# Patient Record
Sex: Female | Born: 1935 | Race: White | Hispanic: No | Smoking: Never smoker
Health system: Southern US, Community
[De-identification: ages and names within clinical notes are randomized; demographics above are authoritative.]

## PROBLEM LIST (undated history)

## (undated) DIAGNOSIS — E7800 Pure hypercholesterolemia, unspecified: Secondary | ICD-10-CM

## (undated) DIAGNOSIS — M1990 Unspecified osteoarthritis, unspecified site: Secondary | ICD-10-CM

## (undated) DIAGNOSIS — G43909 Migraine, unspecified, not intractable, without status migrainosus: Secondary | ICD-10-CM

## (undated) DIAGNOSIS — F329 Major depressive disorder, single episode, unspecified: Secondary | ICD-10-CM

## (undated) DIAGNOSIS — R569 Unspecified convulsions: Secondary | ICD-10-CM

## (undated) DIAGNOSIS — F5104 Psychophysiologic insomnia: Secondary | ICD-10-CM

## (undated) DIAGNOSIS — K449 Diaphragmatic hernia without obstruction or gangrene: Secondary | ICD-10-CM

## (undated) DIAGNOSIS — Q752 Hypertelorism: Secondary | ICD-10-CM

## (undated) DIAGNOSIS — D329 Benign neoplasm of meninges, unspecified: Secondary | ICD-10-CM

## (undated) DIAGNOSIS — F419 Anxiety disorder, unspecified: Secondary | ICD-10-CM

## (undated) DIAGNOSIS — F32A Depression, unspecified: Secondary | ICD-10-CM

## (undated) DIAGNOSIS — K219 Gastro-esophageal reflux disease without esophagitis: Secondary | ICD-10-CM

## (undated) DIAGNOSIS — I10 Essential (primary) hypertension: Secondary | ICD-10-CM

## (undated) DIAGNOSIS — G8929 Other chronic pain: Secondary | ICD-10-CM

## (undated) DIAGNOSIS — M545 Low back pain: Secondary | ICD-10-CM

## (undated) HISTORY — DX: Depression, unspecified: F32.A

## (undated) HISTORY — DX: Other chronic pain: G89.29

## (undated) HISTORY — PX: STOMACH SURGERY: SHX791

## (undated) HISTORY — DX: Migraine, unspecified, not intractable, without status migrainosus: G43.909

## (undated) HISTORY — DX: Pure hypercholesterolemia, unspecified: E78.00

## (undated) HISTORY — DX: Anxiety disorder, unspecified: F41.9

## (undated) HISTORY — DX: Essential (primary) hypertension: I10

## (undated) HISTORY — DX: Psychophysiologic insomnia: F51.04

## (undated) HISTORY — DX: Hypertelorism: Q75.2

## (undated) HISTORY — DX: Diaphragmatic hernia without obstruction or gangrene: K44.9

## (undated) HISTORY — DX: Gastro-esophageal reflux disease without esophagitis: K21.9

## (undated) HISTORY — DX: Low back pain: M54.5

## (undated) HISTORY — DX: Benign neoplasm of meninges, unspecified: D32.9

## (undated) HISTORY — DX: Unspecified convulsions: R56.9

## (undated) HISTORY — DX: Major depressive disorder, single episode, unspecified: F32.9

## (undated) HISTORY — DX: Unspecified osteoarthritis, unspecified site: M19.90

---

## 2002-06-16 ENCOUNTER — Other Ambulatory Visit: Admission: RE | Admit: 2002-06-16 | Discharge: 2002-06-16 | Payer: Self-pay | Admitting: Gynecology

## 2003-02-09 ENCOUNTER — Other Ambulatory Visit: Admission: RE | Admit: 2003-02-09 | Discharge: 2003-02-09 | Payer: Self-pay | Admitting: Gynecology

## 2003-03-20 ENCOUNTER — Encounter: Admission: RE | Admit: 2003-03-20 | Discharge: 2003-03-20 | Payer: Self-pay | Admitting: Family Medicine

## 2003-03-20 ENCOUNTER — Encounter: Payer: Self-pay | Admitting: Family Medicine

## 2003-04-26 ENCOUNTER — Ambulatory Visit: Admission: RE | Admit: 2003-04-26 | Discharge: 2003-04-26 | Payer: Self-pay | Admitting: Gynecology

## 2003-05-04 ENCOUNTER — Ambulatory Visit (HOSPITAL_COMMUNITY): Admission: RE | Admit: 2003-05-04 | Discharge: 2003-05-04 | Payer: Self-pay | Admitting: Gastroenterology

## 2003-11-02 ENCOUNTER — Other Ambulatory Visit: Admission: RE | Admit: 2003-11-02 | Discharge: 2003-11-02 | Payer: Self-pay | Admitting: Gynecology

## 2003-11-02 ENCOUNTER — Emergency Department (HOSPITAL_COMMUNITY): Admission: EM | Admit: 2003-11-02 | Discharge: 2003-11-03 | Payer: Self-pay

## 2004-03-13 ENCOUNTER — Other Ambulatory Visit: Admission: RE | Admit: 2004-03-13 | Discharge: 2004-03-13 | Payer: Self-pay | Admitting: Gynecology

## 2004-09-04 ENCOUNTER — Other Ambulatory Visit: Admission: RE | Admit: 2004-09-04 | Discharge: 2004-09-04 | Payer: Self-pay | Admitting: Gynecology

## 2005-04-17 ENCOUNTER — Other Ambulatory Visit: Admission: RE | Admit: 2005-04-17 | Discharge: 2005-04-17 | Payer: Self-pay | Admitting: Gynecology

## 2005-12-04 ENCOUNTER — Other Ambulatory Visit: Admission: RE | Admit: 2005-12-04 | Discharge: 2005-12-04 | Payer: Self-pay | Admitting: Gynecology

## 2006-05-20 ENCOUNTER — Other Ambulatory Visit: Admission: RE | Admit: 2006-05-20 | Discharge: 2006-05-20 | Payer: Self-pay | Admitting: Gynecology

## 2007-01-26 ENCOUNTER — Other Ambulatory Visit: Admission: RE | Admit: 2007-01-26 | Discharge: 2007-01-26 | Payer: Self-pay | Admitting: Gynecology

## 2007-08-23 ENCOUNTER — Other Ambulatory Visit: Admission: RE | Admit: 2007-08-23 | Discharge: 2007-08-23 | Payer: Self-pay | Admitting: Gynecology

## 2008-02-23 ENCOUNTER — Ambulatory Visit: Payer: Self-pay | Admitting: Psychology

## 2008-03-02 ENCOUNTER — Ambulatory Visit: Payer: Self-pay | Admitting: Psychology

## 2008-03-16 ENCOUNTER — Ambulatory Visit: Payer: Self-pay | Admitting: Psychology

## 2008-04-03 ENCOUNTER — Encounter: Admission: RE | Admit: 2008-04-03 | Discharge: 2008-04-03 | Payer: Self-pay | Admitting: Specialist

## 2008-05-31 ENCOUNTER — Other Ambulatory Visit: Admission: RE | Admit: 2008-05-31 | Discharge: 2008-05-31 | Payer: Self-pay | Admitting: *Deleted

## 2011-03-07 NOTE — Consult Note (Signed)
NAME:  Tracey Armstrong, Tracey Armstrong                         ACCOUNT NO.:  [id number]   MEDICAL RECORD NO.:  [id number]                   PATIENT TYPE:  OUT   LOCATION:  GYN                                  FACILITY:  [facility]   PHYSICIAN:  De Blanch, M.D.         DATE OF BIRTH:  27-Sep-1935   DATE OF CONSULTATION:  04/26/2003  DATE OF DISCHARGE:                                   CONSULTATION   GYN ONCOLOGY PROGRAM   CHIEF COMPLAINT:  Abnormal Pap smear.   HISTORY OF PRESENT ILLNESS:  A 76 year old white married female seen in  consultation at the request of Dr. Teodora Medici regarding persistently  abnormal Pap smears.  The patient alleges that she had normal Pap smears on  an annual basis until April of 2003.  At that time, she had an ASCUS Pap  smear.  Cervicitis was treated.  In May of 2003, she had colposcopy with  biopsies that showed no abnormalities.  A repeat Pap smear in August of  2003, showed low-grade SIL.  Testing for high-risk HPV types revealed the  presence of HPV that was high risk.  She had a repeat colposcopy in November  of 2003, and because of inadequate ability to see the transformation zone, a  LEEP procedure was performed which revealed a small area of moderate  dysplasia with negative margins.  The patient had an uneventful  postoperative course.  In May of 2004, repeat Pap smear showed low-grade  SIL, and colposcopy with biopsies at that time showed slight dysplasia.   The patient has no other significant gynecologic history.   Her other complaint is that she has pain on the left side of the anus.  She  has previously had a hemorrhoidectomy in [facility].   PAST MEDICAL HISTORY:  Medical illnesses:  None.   PAST SURGICAL HISTORY:  Hemorrhoidectomy.   CURRENT MEDICATIONS:  Lipitor, Actonel, Bextra, Xanax, Neurontin 300 mg  Armstrong.i.d.   SOCIAL HISTORY:  She does not smoke.   OBSTETRICAL HISTORY:  Gravida 4.   DRUG ALLERGIES:  None.   REVIEW OF SYSTEMS:   Negative except as noted above.   PHYSICAL EXAM:  Weight 129 pounds, height 5 feet 2, blood pressure 118/70,  pulse 72, respiratory rate 20.  GENERAL:  The patient is a healthy white female in no acute distress.  HEENT:  Negative.  NECK:  Supple without thyromegaly.  ABDOMEN:  Soft and nontender.  No mass, organomegaly, ascites, or hernias  are noted.  PELVIC:  EGBUS is normal but slightly atrophic.  Inspection of the anus  appears essentially normal.  She has some small hemorrhoids.  Palpation of  the anus reveals no masses or nodularity.  The tenderness that she seems to  identify is at approximately 2 o'clock.  She does have a well-healed  episiotomy scar.  The vagina is atrophic.  The cervix is flush with the  vaginal vault and stenotic.  The uterus is small and anterior, and normal in  shape, size, and consistency.  There are no adnexal masses noted.  Rectovaginal exam confirms.   Colposcopic examination of the cervix, vulva, and vagina are performed.  No  lesions are noted and the squamocolumnar junction is stenotic.   IMPRESSION:  Low-grade squamous intraepithelial lesion (SIL).   I had a lengthy discussion with the patient regarding the natural history of  this disease.  She understands that this is a papilloma virus infection  which we do not have an antibiotic to treat.  I would recommend that she  have repeat Pap smears at six month intervals and that no further  intervention be undertaken unless she has a high-grade dysplasia Pap smear.   She will return to the care of Dr. Chevis Pretty for a repeat Pap smear in six  months.                                               De Blanch, M.D.    DC/MEDQ  D:  04/26/2003  T:  04/26/2003  Job:  [id number]   cc:   Leatha Gilding. Mezer, M.D.  [address]  [place]  [place] [address]  Fax: [phone]   Teena Irani. Arlyce Dice, M.D.  [address]  [place]  [place] [address]  Fax: [phone]   Telford Nab, R.N.  [address]  [place], [place] [address]

## 2011-03-07 NOTE — Op Note (Signed)
NAME:  Tracey Armstrong, Tracey Armstrong                         ACCOUNT NO.:  [id number]   MEDICAL RECORD NO.:  [id number]                   PATIENT TYPE:  AMB   LOCATION:  ENDO                                 FACILITY:  [facility]   PHYSICIAN:  Petra Kuba, M.D.                 DATE OF BIRTH:  03/14/1935   DATE OF PROCEDURE:  05/04/2003  DATE OF DISCHARGE:                                 OPERATIVE REPORT   PROCEDURE PERFORMED:  Colonoscopy.   ENDOSCOPIST:  Petra Kuba, M.D.   INDICATIONS FOR PROCEDURE:  Persistent rectal burning in a patient without  previous colonoscopy but multiple flexible sigmoidoscopies in the past.  Consent was signed after the risks, benefits, methods and options were  thoroughly discussed with the patient on multiple occasions.   MEDICINES USED:  Demerol 80 mg, Versed 9 mg.   DESCRIPTION OF PROCEDURE:  Rectal inspection was pertinent for external  hemorrhoids, small.  Digital exam was negative.  A video pediatric  adjustable colonoscope was inserted and anorectal pull-through and retroflex  viewing of the rectum revealed some small hemorrhoids and a small anal  papilla from previous hemorrhoidal scarring but no obvious fissure or other  abnormalities.  Went ahead and straightened the scope and with some  difficulty due to some looping and a tortuous colon, it was able to be  advanced to the cecum.  This did require rolling her on her back and various  abdominal pressures.  Other than some left-sided occasional diverticula, no  abnormalities were seen. The cecum was identified by the appendiceal orifice  and the ileocecal valve.  In fact, the scope was inserted a short ways into  the terminal ileum which was normal.  Photodocumentation was obtained.  The  scope was then slowly withdrawn.  The prep was adequate.  There was some  liquid stool that required washing and suctioning.  On slow withdrawal  through the colon, other than the rare occasional left-sided diverticula,  no  abnormalities were seen.  Again, once back in the rectum, anorectal pull-  through and retroflexion did not reveal any additional findings.  Scope was  straightened and advanced a short ways up the left side of the colon.  Air was suctioned, scope removed.  The patient tolerated the procedure well.  There was no immediate obvious complication.   ENDOSCOPIC DIAGNOSIS:  1. Internal and external hemorrhoids with a little anal papilla.  2. Rare left-sided diverticula.  3. Otherwise within normal limits to the terminal ileum.    PLAN:  Yearly rectals and guaiacs per either Dr. Arlyce Dice or Dr. Kemper DurieSharol Given.  GI follow-up as needed.  Repeat screening in five to 10 years.  Will ask Dr. Kendrick Ranch to evaluate her and see if he can be of any further  help.  As an aside, Elavil and Neurontin have not been helpful although she  will use the Neurontin at  a very low dose.                                               Petra Kuba, M.D.    MEM/MEDQ  D:  05/04/2003  T:  05/04/2003  Job:  [id number]   cc:   Teena Irani. Arlyce Dice, M.D.  [address]  [place]  [place] [address]  Fax: [phone]   Sheppard Plumber. Earlene Plater, M.D.  [address] [place]  [place] [address]  Fax: [phone]   De Blanch, M.D.

## 2012-04-19 ENCOUNTER — Other Ambulatory Visit: Payer: Self-pay | Admitting: Gastroenterology

## 2012-04-19 DIAGNOSIS — R109 Unspecified abdominal pain: Secondary | ICD-10-CM | POA: Diagnosis not present

## 2012-04-19 DIAGNOSIS — R635 Abnormal weight gain: Secondary | ICD-10-CM | POA: Diagnosis not present

## 2012-04-26 ENCOUNTER — Ambulatory Visit
Admission: RE | Admit: 2012-04-26 | Discharge: 2012-04-26 | Disposition: A | Discharge status: Home / Self Care | Payer: Medicare Other | Source: Ambulatory Visit | Attending: Gastroenterology | Admitting: Gastroenterology

## 2012-04-26 DIAGNOSIS — R109 Unspecified abdominal pain: Secondary | ICD-10-CM

## 2012-04-26 DIAGNOSIS — R635 Abnormal weight gain: Secondary | ICD-10-CM

## 2012-05-26 DIAGNOSIS — E785 Hyperlipidemia, unspecified: Secondary | ICD-10-CM | POA: Diagnosis not present

## 2012-05-26 DIAGNOSIS — K219 Gastro-esophageal reflux disease without esophagitis: Secondary | ICD-10-CM | POA: Diagnosis not present

## 2012-05-26 DIAGNOSIS — F329 Major depressive disorder, single episode, unspecified: Secondary | ICD-10-CM | POA: Diagnosis not present

## 2012-05-26 DIAGNOSIS — F411 Generalized anxiety disorder: Secondary | ICD-10-CM | POA: Diagnosis not present

## 2012-05-26 DIAGNOSIS — F3289 Other specified depressive episodes: Secondary | ICD-10-CM | POA: Diagnosis not present

## 2012-12-09 DIAGNOSIS — M810 Age-related osteoporosis without current pathological fracture: Secondary | ICD-10-CM | POA: Diagnosis not present

## 2012-12-09 DIAGNOSIS — K219 Gastro-esophageal reflux disease without esophagitis: Secondary | ICD-10-CM | POA: Diagnosis not present

## 2012-12-10 DIAGNOSIS — R5381 Other malaise: Secondary | ICD-10-CM | POA: Diagnosis not present

## 2013-01-18 DIAGNOSIS — R944 Abnormal results of kidney function studies: Secondary | ICD-10-CM | POA: Diagnosis not present

## 2013-01-24 DIAGNOSIS — R7301 Impaired fasting glucose: Secondary | ICD-10-CM | POA: Diagnosis not present

## 2013-01-24 DIAGNOSIS — E782 Mixed hyperlipidemia: Secondary | ICD-10-CM | POA: Diagnosis not present

## 2013-01-24 DIAGNOSIS — K219 Gastro-esophageal reflux disease without esophagitis: Secondary | ICD-10-CM | POA: Diagnosis not present

## 2013-01-24 DIAGNOSIS — R5381 Other malaise: Secondary | ICD-10-CM | POA: Diagnosis not present

## 2013-01-24 DIAGNOSIS — R944 Abnormal results of kidney function studies: Secondary | ICD-10-CM | POA: Diagnosis not present

## 2013-01-24 DIAGNOSIS — M810 Age-related osteoporosis without current pathological fracture: Secondary | ICD-10-CM | POA: Diagnosis not present

## 2013-01-24 DIAGNOSIS — G43909 Migraine, unspecified, not intractable, without status migrainosus: Secondary | ICD-10-CM | POA: Diagnosis not present

## 2013-01-24 DIAGNOSIS — E559 Vitamin D deficiency, unspecified: Secondary | ICD-10-CM | POA: Diagnosis not present

## 2013-03-28 ENCOUNTER — Other Ambulatory Visit: Payer: Self-pay | Admitting: Family Medicine

## 2013-03-28 DIAGNOSIS — M278 Other specified diseases of jaws: Secondary | ICD-10-CM | POA: Diagnosis not present

## 2013-03-28 DIAGNOSIS — F329 Major depressive disorder, single episode, unspecified: Secondary | ICD-10-CM | POA: Diagnosis not present

## 2013-03-28 DIAGNOSIS — F411 Generalized anxiety disorder: Secondary | ICD-10-CM | POA: Diagnosis not present

## 2013-03-28 DIAGNOSIS — D239 Other benign neoplasm of skin, unspecified: Secondary | ICD-10-CM | POA: Diagnosis not present

## 2013-03-28 DIAGNOSIS — R51 Headache: Secondary | ICD-10-CM | POA: Diagnosis not present

## 2013-03-28 DIAGNOSIS — K219 Gastro-esophageal reflux disease without esophagitis: Secondary | ICD-10-CM | POA: Diagnosis not present

## 2013-04-04 ENCOUNTER — Other Ambulatory Visit: Payer: Medicare Other

## 2013-04-18 ENCOUNTER — Other Ambulatory Visit: Payer: Medicare Other

## 2013-04-19 ENCOUNTER — Ambulatory Visit
Admission: RE | Admit: 2013-04-19 | Discharge: 2013-04-19 | Disposition: A | Discharge status: Home / Self Care | Payer: Medicare Other | Source: Ambulatory Visit | Attending: Family Medicine | Admitting: Family Medicine

## 2013-04-19 DIAGNOSIS — R51 Headache: Secondary | ICD-10-CM | POA: Diagnosis not present

## 2013-04-21 DIAGNOSIS — L280 Lichen simplex chronicus: Secondary | ICD-10-CM | POA: Diagnosis not present

## 2013-04-21 DIAGNOSIS — D485 Neoplasm of uncertain behavior of skin: Secondary | ICD-10-CM | POA: Diagnosis not present

## 2013-04-21 DIAGNOSIS — L821 Other seborrheic keratosis: Secondary | ICD-10-CM | POA: Diagnosis not present

## 2013-04-25 DIAGNOSIS — H43819 Vitreous degeneration, unspecified eye: Secondary | ICD-10-CM | POA: Diagnosis not present

## 2013-04-25 DIAGNOSIS — H2510 Age-related nuclear cataract, unspecified eye: Secondary | ICD-10-CM | POA: Diagnosis not present

## 2013-04-25 DIAGNOSIS — H52209 Unspecified astigmatism, unspecified eye: Secondary | ICD-10-CM | POA: Diagnosis not present

## 2013-09-23 DIAGNOSIS — R1110 Vomiting, unspecified: Secondary | ICD-10-CM | POA: Diagnosis not present

## 2013-09-23 DIAGNOSIS — F411 Generalized anxiety disorder: Secondary | ICD-10-CM | POA: Diagnosis not present

## 2013-09-23 DIAGNOSIS — F329 Major depressive disorder, single episode, unspecified: Secondary | ICD-10-CM | POA: Diagnosis not present

## 2013-09-23 DIAGNOSIS — G43909 Migraine, unspecified, not intractable, without status migrainosus: Secondary | ICD-10-CM | POA: Diagnosis not present

## 2013-09-28 DIAGNOSIS — G43909 Migraine, unspecified, not intractable, without status migrainosus: Secondary | ICD-10-CM | POA: Diagnosis not present

## 2013-09-28 DIAGNOSIS — R1110 Vomiting, unspecified: Secondary | ICD-10-CM | POA: Diagnosis not present

## 2013-09-28 DIAGNOSIS — F3289 Other specified depressive episodes: Secondary | ICD-10-CM | POA: Diagnosis not present

## 2013-09-28 DIAGNOSIS — F329 Major depressive disorder, single episode, unspecified: Secondary | ICD-10-CM | POA: Diagnosis not present

## 2013-09-29 DIAGNOSIS — G43709 Chronic migraine without aura, not intractable, without status migrainosus: Secondary | ICD-10-CM | POA: Diagnosis not present

## 2013-09-29 DIAGNOSIS — I10 Essential (primary) hypertension: Secondary | ICD-10-CM | POA: Diagnosis not present

## 2013-09-29 DIAGNOSIS — G43009 Migraine without aura, not intractable, without status migrainosus: Secondary | ICD-10-CM | POA: Diagnosis not present

## 2013-10-05 DIAGNOSIS — G43709 Chronic migraine without aura, not intractable, without status migrainosus: Secondary | ICD-10-CM | POA: Diagnosis not present

## 2013-10-17 DIAGNOSIS — G43709 Chronic migraine without aura, not intractable, without status migrainosus: Secondary | ICD-10-CM | POA: Diagnosis not present

## 2013-10-31 DIAGNOSIS — R7309 Other abnormal glucose: Secondary | ICD-10-CM | POA: Diagnosis not present

## 2013-10-31 DIAGNOSIS — K219 Gastro-esophageal reflux disease without esophagitis: Secondary | ICD-10-CM | POA: Diagnosis not present

## 2013-10-31 DIAGNOSIS — G43909 Migraine, unspecified, not intractable, without status migrainosus: Secondary | ICD-10-CM | POA: Diagnosis not present

## 2013-10-31 DIAGNOSIS — F329 Major depressive disorder, single episode, unspecified: Secondary | ICD-10-CM | POA: Diagnosis not present

## 2013-10-31 DIAGNOSIS — F3289 Other specified depressive episodes: Secondary | ICD-10-CM | POA: Diagnosis not present

## 2013-11-09 DIAGNOSIS — F329 Major depressive disorder, single episode, unspecified: Secondary | ICD-10-CM | POA: Diagnosis not present

## 2013-11-09 DIAGNOSIS — K219 Gastro-esophageal reflux disease without esophagitis: Secondary | ICD-10-CM | POA: Diagnosis not present

## 2013-11-09 DIAGNOSIS — F3289 Other specified depressive episodes: Secondary | ICD-10-CM | POA: Diagnosis not present

## 2013-11-09 DIAGNOSIS — F411 Generalized anxiety disorder: Secondary | ICD-10-CM | POA: Diagnosis not present

## 2013-11-09 DIAGNOSIS — G43909 Migraine, unspecified, not intractable, without status migrainosus: Secondary | ICD-10-CM | POA: Diagnosis not present

## 2013-11-24 DIAGNOSIS — G43709 Chronic migraine without aura, not intractable, without status migrainosus: Secondary | ICD-10-CM | POA: Diagnosis not present

## 2013-11-24 DIAGNOSIS — I10 Essential (primary) hypertension: Secondary | ICD-10-CM | POA: Diagnosis not present

## 2013-11-24 DIAGNOSIS — G43009 Migraine without aura, not intractable, without status migrainosus: Secondary | ICD-10-CM | POA: Diagnosis not present

## 2013-12-19 DIAGNOSIS — Z0000 Encounter for general adult medical examination without abnormal findings: Secondary | ICD-10-CM | POA: Diagnosis not present

## 2013-12-19 DIAGNOSIS — K219 Gastro-esophageal reflux disease without esophagitis: Secondary | ICD-10-CM | POA: Diagnosis not present

## 2014-01-09 DIAGNOSIS — K219 Gastro-esophageal reflux disease without esophagitis: Secondary | ICD-10-CM | POA: Diagnosis not present

## 2014-01-10 DIAGNOSIS — F3289 Other specified depressive episodes: Secondary | ICD-10-CM | POA: Diagnosis not present

## 2014-01-10 DIAGNOSIS — K5730 Diverticulosis of large intestine without perforation or abscess without bleeding: Secondary | ICD-10-CM | POA: Diagnosis not present

## 2014-01-10 DIAGNOSIS — I10 Essential (primary) hypertension: Secondary | ICD-10-CM | POA: Diagnosis not present

## 2014-01-10 DIAGNOSIS — F329 Major depressive disorder, single episode, unspecified: Secondary | ICD-10-CM | POA: Diagnosis not present

## 2014-01-10 DIAGNOSIS — G43909 Migraine, unspecified, not intractable, without status migrainosus: Secondary | ICD-10-CM | POA: Diagnosis not present

## 2014-01-10 DIAGNOSIS — Z01810 Encounter for preprocedural cardiovascular examination: Secondary | ICD-10-CM | POA: Diagnosis not present

## 2014-01-10 DIAGNOSIS — R109 Unspecified abdominal pain: Secondary | ICD-10-CM | POA: Diagnosis not present

## 2014-01-10 DIAGNOSIS — K449 Diaphragmatic hernia without obstruction or gangrene: Secondary | ICD-10-CM | POA: Diagnosis not present

## 2014-01-11 DIAGNOSIS — G43909 Migraine, unspecified, not intractable, without status migrainosus: Secondary | ICD-10-CM | POA: Diagnosis not present

## 2014-01-11 DIAGNOSIS — Z1211 Encounter for screening for malignant neoplasm of colon: Secondary | ICD-10-CM | POA: Diagnosis not present

## 2014-01-11 DIAGNOSIS — R109 Unspecified abdominal pain: Secondary | ICD-10-CM | POA: Diagnosis not present

## 2014-01-11 DIAGNOSIS — K219 Gastro-esophageal reflux disease without esophagitis: Secondary | ICD-10-CM | POA: Diagnosis not present

## 2014-01-11 DIAGNOSIS — K5730 Diverticulosis of large intestine without perforation or abscess without bleeding: Secondary | ICD-10-CM | POA: Diagnosis not present

## 2014-01-11 DIAGNOSIS — I10 Essential (primary) hypertension: Secondary | ICD-10-CM | POA: Diagnosis not present

## 2014-01-11 DIAGNOSIS — D131 Benign neoplasm of stomach: Secondary | ICD-10-CM | POA: Diagnosis not present

## 2014-01-11 DIAGNOSIS — F329 Major depressive disorder, single episode, unspecified: Secondary | ICD-10-CM | POA: Diagnosis not present

## 2014-01-11 DIAGNOSIS — K449 Diaphragmatic hernia without obstruction or gangrene: Secondary | ICD-10-CM | POA: Diagnosis not present

## 2014-01-11 DIAGNOSIS — F3289 Other specified depressive episodes: Secondary | ICD-10-CM | POA: Diagnosis not present

## 2014-01-23 DIAGNOSIS — K449 Diaphragmatic hernia without obstruction or gangrene: Secondary | ICD-10-CM | POA: Diagnosis not present

## 2014-01-28 DIAGNOSIS — I10 Essential (primary) hypertension: Secondary | ICD-10-CM | POA: Diagnosis not present

## 2014-01-28 DIAGNOSIS — G589 Mononeuropathy, unspecified: Secondary | ICD-10-CM | POA: Diagnosis not present

## 2014-01-28 DIAGNOSIS — K219 Gastro-esophageal reflux disease without esophagitis: Secondary | ICD-10-CM | POA: Diagnosis not present

## 2014-01-28 DIAGNOSIS — F411 Generalized anxiety disorder: Secondary | ICD-10-CM | POA: Diagnosis not present

## 2014-01-28 DIAGNOSIS — E669 Obesity, unspecified: Secondary | ICD-10-CM | POA: Diagnosis not present

## 2014-01-28 DIAGNOSIS — Z6828 Body mass index (BMI) 28.0-28.9, adult: Secondary | ICD-10-CM | POA: Diagnosis not present

## 2014-01-28 DIAGNOSIS — K449 Diaphragmatic hernia without obstruction or gangrene: Secondary | ICD-10-CM | POA: Diagnosis not present

## 2014-01-28 DIAGNOSIS — G43909 Migraine, unspecified, not intractable, without status migrainosus: Secondary | ICD-10-CM | POA: Diagnosis not present

## 2014-02-13 DIAGNOSIS — F329 Major depressive disorder, single episode, unspecified: Secondary | ICD-10-CM | POA: Diagnosis not present

## 2014-02-13 DIAGNOSIS — F3289 Other specified depressive episodes: Secondary | ICD-10-CM | POA: Diagnosis not present

## 2014-02-13 DIAGNOSIS — K219 Gastro-esophageal reflux disease without esophagitis: Secondary | ICD-10-CM | POA: Diagnosis not present

## 2014-02-13 DIAGNOSIS — F411 Generalized anxiety disorder: Secondary | ICD-10-CM | POA: Diagnosis not present

## 2014-02-13 DIAGNOSIS — R7309 Other abnormal glucose: Secondary | ICD-10-CM | POA: Diagnosis not present

## 2014-03-24 DIAGNOSIS — I10 Essential (primary) hypertension: Secondary | ICD-10-CM | POA: Diagnosis not present

## 2014-03-24 DIAGNOSIS — F411 Generalized anxiety disorder: Secondary | ICD-10-CM | POA: Diagnosis not present

## 2014-03-24 DIAGNOSIS — M549 Dorsalgia, unspecified: Secondary | ICD-10-CM | POA: Diagnosis not present

## 2014-03-24 DIAGNOSIS — F329 Major depressive disorder, single episode, unspecified: Secondary | ICD-10-CM | POA: Diagnosis not present

## 2014-03-24 DIAGNOSIS — F3289 Other specified depressive episodes: Secondary | ICD-10-CM | POA: Diagnosis not present

## 2014-03-24 DIAGNOSIS — G43909 Migraine, unspecified, not intractable, without status migrainosus: Secondary | ICD-10-CM | POA: Diagnosis not present

## 2014-03-31 DIAGNOSIS — G8929 Other chronic pain: Secondary | ICD-10-CM | POA: Diagnosis not present

## 2014-04-03 DIAGNOSIS — K589 Irritable bowel syndrome without diarrhea: Secondary | ICD-10-CM | POA: Diagnosis not present

## 2014-04-03 DIAGNOSIS — K2100 Gastro-esophageal reflux disease with esophagitis, without bleeding: Secondary | ICD-10-CM | POA: Diagnosis not present

## 2014-04-03 DIAGNOSIS — R109 Unspecified abdominal pain: Secondary | ICD-10-CM | POA: Diagnosis not present

## 2014-04-19 ENCOUNTER — Encounter: Payer: Self-pay | Admitting: Neurology

## 2014-04-19 ENCOUNTER — Ambulatory Visit (INDEPENDENT_AMBULATORY_CARE_PROVIDER_SITE_OTHER): Payer: Medicare Other | Admitting: Neurology

## 2014-04-19 VITALS — BP 118/72 | HR 88 | Temp 97.5°F | Resp 16 | Ht 63.0 in | Wt 147.6 lb

## 2014-04-19 DIAGNOSIS — G8929 Other chronic pain: Secondary | ICD-10-CM

## 2014-04-19 DIAGNOSIS — F329 Major depressive disorder, single episode, unspecified: Secondary | ICD-10-CM | POA: Diagnosis not present

## 2014-04-19 DIAGNOSIS — R519 Headache, unspecified: Secondary | ICD-10-CM

## 2014-04-19 DIAGNOSIS — F32A Depression, unspecified: Secondary | ICD-10-CM

## 2014-04-19 DIAGNOSIS — M549 Dorsalgia, unspecified: Secondary | ICD-10-CM

## 2014-04-19 DIAGNOSIS — G43909 Migraine, unspecified, not intractable, without status migrainosus: Secondary | ICD-10-CM | POA: Insufficient documentation

## 2014-04-19 DIAGNOSIS — F3289 Other specified depressive episodes: Secondary | ICD-10-CM

## 2014-04-19 DIAGNOSIS — R51 Headache: Secondary | ICD-10-CM

## 2014-04-19 DIAGNOSIS — F411 Generalized anxiety disorder: Secondary | ICD-10-CM

## 2014-04-19 DIAGNOSIS — I10 Essential (primary) hypertension: Secondary | ICD-10-CM

## 2014-04-19 MED ORDER — NORTRIPTYLINE HCL 10 MG PO CAPS: 10.0000 mg | ORAL_CAPSULE | Freq: Every day | ORAL | Status: DC

## 2014-04-19 NOTE — Patient Instructions (Signed)
1. Start nortriptyline 10mg : Take 1 cap at bedtime for 2 weeks, then increase to 2 caps at bedtime 2. Continue gabapentin 200mg  in AM, 300mg  in PM 3. Keep a calendar of the headaches 4. Continue to monitor balance and walking, fall precautions

## 2014-04-19 NOTE — Progress Notes (Signed)
NEUROLOGY CONSULTATION NOTE  Tracey Armstrong MRN: [id number] DOB: 04/30/1935  Referring provider: Dr. Maurice Small Primary care provider: Dr. Maurice Small  Reason for consult:  migraines  Dear Dr Justin Mend:  Thank you for your kind referral of Tracey Armstrong for consultation of the above symptoms. Although her history is well known to you, please allow me to reiterate it for the purpose of our medical record. Records and images were personally reviewed where available.  HISTORY OF PRESENT ILLNESS: This is a 79 year old right-handed woman with a history of depression, anxiety, chronic low back pain, and chronic migraines.  She would live in [place] several months of the year, and saw a neurologist there, as well as Dr. Domingo Cocking at [facility] in [place].  She reports having migraines since age 72, with throbbing and pressure pain in the right retro-orbital region radiating to the back of her head, occurring around twice a week where she has to stay in bed.  She would take Maxalt MLT which helps within an hour.  There is associated nausea, photo and phonophobia, no visual aura or focal numbness/tingling/weakness.  Over the past 2 years, she has had a different type of pressure-like headache localized over the right parietal region occurring 3-4 times a week.  Only Norco would help with this, she takes 1/2 tablet BID, but sometimes would take an extra half tablet.  It appears she had problems with medication overuse in the past, she brings a sheet of paper where she was instructed by Dr. Domingo Cocking to stop Darvocet.  She has tried trigger point injections with minimal effect. She recalls having Botox in 2009 which caused significant neck weakness.  Her neurologist Dr. Patrice Paradise in [facility] did a retrial of Botox last 09/2013 which she feels helped.  She has been taking Verapamil and Gabapentin for headache prophylaxis, and feels the gabapentin helps her more. She take gabapentin 200mg  in  AM, 300mg  in PM.  She does not recall trying any beta-blockers in the past.  She had side effects on Topamax, amitriptyline, and Pristiq.  She has not tried Depakote but is afraid of the side effects. She has not tried Effexor due to unrecalled side effects on Pristiq.  She endorses depressive symptoms and is interested in trying a prophylactic medication with an anti-depressant.  She has tried Cymbalta, amitriptyline, Trazodone, and Imipramine in the past which would worsen her headaches within the 3rd week.    There is a strong family history of migraines in her grandmother, maternal aunt, father, and 5 nieces.  Migraine triggers include weather changes, heat, and air blowing in her face.  She reports that sleep is poor.  She is "just so stressed out, but there is nothing that should stress me out."  She had a whole week of migraines last year, followed by vertigo.  She reports Benadryl helps.  She had another episode of vertigo the other day after she had a migraine for a week, however recently she has been having dizziness when she would sit up from a supine position. She denies any dizziness when turning in bed.  She recalls having dizzy spells with falls as a child, and has had an increase in frequency of falls where she would just trip.  Last fall was a month ago, with an abrasion in her right knee.  She had abdominal surgery recently and was prescribed Norco for this as well.  She denies any diplopia, dysarthria, dysphagia, bowel/bladder  dysfunction. She has some pain in the right upper neck and chronic back pain radiating down the left leg.    I personally reviewed MRI brain without contrast which was unremarkable, there was note of focal bony thickening in the right parietal bone, unchanged from the prior study. There was mild enhancement of this area previously and is most likely is a small meningioma, stable.  PAST MEDICAL HISTORY: Past Medical History  Diagnosis Date  . Migraine   . GERD  (gastroesophageal reflux disease)   . Hypertelorism     PAST SURGICAL HISTORY: Past Surgical History  Procedure Laterality Date  . Stomach surgery      MEDICATIONS: No current outpatient prescriptions on file prior to visit.   No current facility-administered medications on file prior to visit.    ALLERGIES: No Known Allergies  FAMILY HISTORY: Family History  Problem Relation Age of Onset  . Migraines Father   . Heart Problems Mother     SOCIAL HISTORY: History   Social History  . Marital Status: Legally Separated    Spouse Name: N/A    Number of Children: N/A  . Years of Education: N/A   Occupational History  . Not on file.   Social History Main Topics  . Smoking status: Never Smoker   . Smokeless tobacco: Not on file  . Alcohol Use: No  . Drug Use: No  . Sexual Activity: Not on file   Other Topics Concern  . Not on file   Social History Narrative  . No narrative on file    REVIEW OF SYSTEMS: Constitutional: No fevers, chills, or sweats, no generalized fatigue, change in appetite Eyes: No visual changes, double vision, eye pain Ear, nose and throat: No hearing loss, ear pain, nasal congestion, sore throat Cardiovascular: No chest pain, palpitations Respiratory:  No shortness of breath at rest or with exertion, wheezes GastrointestinaI: No diarrhea, abdominal pain, fecal incontinence Genitourinary:  No dysuria, urinary retention or frequency Musculoskeletal:  + neck pain, back pain Integumentary: No rash, pruritus, skin lesions Neurological: as above Psychiatric: + depression, insomnia, anxiety Endocrine: No palpitations, fatigue, diaphoresis, mood swings, change in appetite, change in weight, increased thirst Hematologic/Lymphatic:  No anemia, purpura, petechiae. Allergic/Immunologic: no itchy/runny eyes, nasal congestion, recent allergic reactions, rashes  PHYSICAL EXAM: Filed Vitals:   04/19/14 1300  BP: 118/72  Pulse: 88  Temp: 97.5 F  (36.4 C)  Resp: 16   General: No acute distress Head:  Normocephalic/atraumatic Eyes: Fundoscopic exam shows bilateral sharp discs, no vessel changes, exudates, or hemorrhages Neck: supple, no paraspinal tenderness, full range of motion Back: No paraspinal tenderness Heart: regular rate and rhythm Lungs: Clear to auscultation bilaterally. Vascular: No carotid bruits. Skin/Extremities: No rash, no edema Neurological Exam: Mental status: alert and oriented to person, place, and time, no dysarthria or aphasia, Fund of knowledge is appropriate.  Recent and remote memory are intact.  Attention and concentration are normal.    Able to name objects and repeat phrases. Cranial nerves: CN I: not tested CN II: pupils equal, round and reactive to light, visual fields intact, fundi unremarkable. CN III, IV, VI:  full range of motion, no nystagmus, no ptosis CN V: facial sensation intact CN VII: upper and lower face symmetric CN VIII: hearing intact to finger rub CN IX, X: gag intact, uvula midline CN XI: sternocleidomastoid and trapezius muscles intact CN XII: tongue midline Bulk & Tone: normal, no cogwheeling, no fasciculations. Motor: 5/5 throughout with no pronator drift. Sensation: decreased vibration  sense up to right ankle, intact on left. Otherwise intact to all modalities on both UE, intact to light touch, cold, pin, and joint position sense in both LE.  No extinction to double simultaneous stimulation.  Romberg test negative Deep Tendon Reflexes: +2 throughout except for absent bilateral ankle jerks, no ankle clonus Plantar responses: downgoing bilaterally Cerebellar: no incoordination on finger to nose, heel to shin. No dysdiadochokinesia Gait: slow and cautious, no ataxia, mild difficulty with tandem walk but able. Tremor: none  IMPRESSION: This is a 79 year old right-handed woman with a history of depression, anxiety, chronic back pain, and chronic migraines, likely with component  of medication overuse headaches due to daily intake of Norco.  She is taking Verapamil and gabapentin for headache prophylaxis. She feels gabapentin is helping with her headaches, and we discussed the option of increasing dose, however she reports that the reason she came for this visit is to see if there is anything we could offer her that is new with migraine prophylaxis.  I discussed with her that she has not tried beta-blockers, nortriptyline, Effexor, however there are no new medications out at this time.  Botox is another consideration.  She would like to try a preventative medication that helps with depression, and will start nortriptyline 10mg  qhs with uptitration as tolerated. Side effects were discussed. We discussed medication overuse headaches and to minimize Norco use.  She will keep a headache diary. We discussed the increased falls, likely secondary to peripheral neuropathy. We discussed doing physical therapy for gait and balance, however she would like to hold off on this for now. The dizziness she reports occurs when she changes position from supine to standing, suggestive of orthostatic component, she will monitor BP and discuss this with her PCP.  She will follow-up in 2 months.  Thank you for allowing me to participate in the care of this patient. Please do not hesitate to call for any questions or concerns.   Ellouise Newer, M.D.  CC: Dr. Maurice Small

## 2014-04-20 DIAGNOSIS — F411 Generalized anxiety disorder: Secondary | ICD-10-CM | POA: Insufficient documentation

## 2014-04-20 DIAGNOSIS — R519 Headache, unspecified: Secondary | ICD-10-CM | POA: Insufficient documentation

## 2014-04-20 DIAGNOSIS — M549 Dorsalgia, unspecified: Secondary | ICD-10-CM

## 2014-04-20 DIAGNOSIS — R51 Headache: Secondary | ICD-10-CM

## 2014-04-20 DIAGNOSIS — F32A Depression, unspecified: Secondary | ICD-10-CM | POA: Insufficient documentation

## 2014-04-20 DIAGNOSIS — I10 Essential (primary) hypertension: Secondary | ICD-10-CM | POA: Insufficient documentation

## 2014-04-20 DIAGNOSIS — F329 Major depressive disorder, single episode, unspecified: Secondary | ICD-10-CM | POA: Insufficient documentation

## 2014-04-20 DIAGNOSIS — G8929 Other chronic pain: Secondary | ICD-10-CM | POA: Insufficient documentation

## 2014-05-04 ENCOUNTER — Telehealth: Payer: Self-pay | Admitting: Neurology

## 2014-05-04 NOTE — Telephone Encounter (Signed)
Would you like for me to start a pre auth with insurance for this?

## 2014-05-04 NOTE — Telephone Encounter (Signed)
Pt wants to consider Botox, ASAP. Please call [phone], OK to LM if needed / Sherri S.

## 2014-05-05 NOTE — Telephone Encounter (Signed)
Yes please, thanks

## 2014-05-05 NOTE — Telephone Encounter (Signed)
Spoke with patient advised patient we would be getting this approved by her insurance and would called her once we are ready to schedule

## 2014-05-05 NOTE — Telephone Encounter (Signed)
Pt would like to talk to someone [phone] about botox

## 2014-05-09 DIAGNOSIS — M5030 Other cervical disc degeneration, unspecified cervical region: Secondary | ICD-10-CM | POA: Diagnosis not present

## 2014-05-09 DIAGNOSIS — M9981 Other biomechanical lesions of cervical region: Secondary | ICD-10-CM | POA: Diagnosis not present

## 2014-05-10 DIAGNOSIS — M9981 Other biomechanical lesions of cervical region: Secondary | ICD-10-CM | POA: Diagnosis not present

## 2014-05-10 DIAGNOSIS — M5030 Other cervical disc degeneration, unspecified cervical region: Secondary | ICD-10-CM | POA: Diagnosis not present

## 2014-05-15 DIAGNOSIS — G8929 Other chronic pain: Secondary | ICD-10-CM | POA: Diagnosis not present

## 2014-05-15 DIAGNOSIS — G43909 Migraine, unspecified, not intractable, without status migrainosus: Secondary | ICD-10-CM | POA: Diagnosis not present

## 2014-05-15 DIAGNOSIS — F3289 Other specified depressive episodes: Secondary | ICD-10-CM | POA: Diagnosis not present

## 2014-05-15 DIAGNOSIS — F329 Major depressive disorder, single episode, unspecified: Secondary | ICD-10-CM | POA: Diagnosis not present

## 2014-06-23 DIAGNOSIS — L089 Local infection of the skin and subcutaneous tissue, unspecified: Secondary | ICD-10-CM | POA: Diagnosis not present

## 2014-07-17 DIAGNOSIS — H43819 Vitreous degeneration, unspecified eye: Secondary | ICD-10-CM | POA: Diagnosis not present

## 2014-07-17 DIAGNOSIS — H52209 Unspecified astigmatism, unspecified eye: Secondary | ICD-10-CM | POA: Diagnosis not present

## 2014-07-17 DIAGNOSIS — H25019 Cortical age-related cataract, unspecified eye: Secondary | ICD-10-CM | POA: Diagnosis not present

## 2014-07-17 DIAGNOSIS — H2510 Age-related nuclear cataract, unspecified eye: Secondary | ICD-10-CM | POA: Diagnosis not present

## 2014-07-19 DIAGNOSIS — I10 Essential (primary) hypertension: Secondary | ICD-10-CM | POA: Diagnosis not present

## 2014-07-19 DIAGNOSIS — F329 Major depressive disorder, single episode, unspecified: Secondary | ICD-10-CM | POA: Diagnosis not present

## 2014-07-19 DIAGNOSIS — G43909 Migraine, unspecified, not intractable, without status migrainosus: Secondary | ICD-10-CM | POA: Diagnosis not present

## 2014-07-19 DIAGNOSIS — G8929 Other chronic pain: Secondary | ICD-10-CM | POA: Diagnosis not present

## 2014-07-19 DIAGNOSIS — F3289 Other specified depressive episodes: Secondary | ICD-10-CM | POA: Diagnosis not present

## 2014-08-04 DIAGNOSIS — G43909 Migraine, unspecified, not intractable, without status migrainosus: Secondary | ICD-10-CM | POA: Diagnosis not present

## 2014-08-04 DIAGNOSIS — F331 Major depressive disorder, recurrent, moderate: Secondary | ICD-10-CM | POA: Diagnosis not present

## 2014-08-04 DIAGNOSIS — I10 Essential (primary) hypertension: Secondary | ICD-10-CM | POA: Diagnosis not present

## 2014-08-24 DIAGNOSIS — K219 Gastro-esophageal reflux disease without esophagitis: Secondary | ICD-10-CM | POA: Diagnosis not present

## 2014-08-24 DIAGNOSIS — I10 Essential (primary) hypertension: Secondary | ICD-10-CM | POA: Diagnosis not present

## 2014-08-24 DIAGNOSIS — F331 Major depressive disorder, recurrent, moderate: Secondary | ICD-10-CM | POA: Diagnosis not present

## 2014-08-30 DIAGNOSIS — K219 Gastro-esophageal reflux disease without esophagitis: Secondary | ICD-10-CM | POA: Diagnosis not present

## 2014-08-30 DIAGNOSIS — Z6826 Body mass index (BMI) 26.0-26.9, adult: Secondary | ICD-10-CM | POA: Diagnosis not present

## 2014-08-30 DIAGNOSIS — I10 Essential (primary) hypertension: Secondary | ICD-10-CM | POA: Diagnosis not present

## 2014-08-30 DIAGNOSIS — R7309 Other abnormal glucose: Secondary | ICD-10-CM | POA: Diagnosis not present

## 2014-08-30 DIAGNOSIS — G43909 Migraine, unspecified, not intractable, without status migrainosus: Secondary | ICD-10-CM | POA: Diagnosis not present

## 2014-08-30 DIAGNOSIS — E559 Vitamin D deficiency, unspecified: Secondary | ICD-10-CM | POA: Diagnosis not present

## 2014-08-30 DIAGNOSIS — E785 Hyperlipidemia, unspecified: Secondary | ICD-10-CM | POA: Diagnosis not present

## 2014-10-25 DIAGNOSIS — E559 Vitamin D deficiency, unspecified: Secondary | ICD-10-CM | POA: Diagnosis not present

## 2014-10-25 DIAGNOSIS — G8929 Other chronic pain: Secondary | ICD-10-CM | POA: Diagnosis not present

## 2014-10-25 DIAGNOSIS — Z5181 Encounter for therapeutic drug level monitoring: Secondary | ICD-10-CM | POA: Diagnosis not present

## 2014-10-25 DIAGNOSIS — G43909 Migraine, unspecified, not intractable, without status migrainosus: Secondary | ICD-10-CM | POA: Diagnosis not present

## 2014-10-25 DIAGNOSIS — F331 Major depressive disorder, recurrent, moderate: Secondary | ICD-10-CM | POA: Diagnosis not present

## 2014-10-30 ENCOUNTER — Ambulatory Visit: Payer: Medicare Other | Admitting: Neurology

## 2014-10-30 ENCOUNTER — Telehealth: Payer: Self-pay | Admitting: Neurology

## 2014-10-30 NOTE — Telephone Encounter (Signed)
Pt called at 8:44AM to let us know that she is not going to be able to make her f/u appt for today. Pt has a migraine. She will call later to r/s. I explained to her our reschedule/cancelation policy.

## 2014-10-30 NOTE — Telephone Encounter (Signed)
Noted  

## 2014-10-31 ENCOUNTER — Telehealth: Payer: Self-pay | Admitting: Family Medicine

## 2014-10-31 NOTE — Telephone Encounter (Signed)
FYI: I spoke with Tracey Armstrong she wants to hold off on Botox for now, she states when had Botox in the past it seemed to make her headahes worse once they come back. She thinks her current med regimen is working well, she states she is down to 1 headache a week. She states that she will try Botox again if her headaches become bad enough.  She states that her insurance hasn't changed & they have approved Botox for her twice before. I told her that if she got to a point where she wanted to try Botox again to call me & I will re-submit request.

## 2014-10-31 NOTE — Telephone Encounter (Signed)
appt still marked as a no show due to less than 24hrs prior notification. A no show letter will not be sen / Sherri S.

## 2014-10-31 NOTE — Telephone Encounter (Signed)
-----   Message from Cameron Sprang, MD sent at 10/30/2014 10:59 AM EST ----- Regarding: RE: can you pls check She called to cancel today because of a migraine. Can you let her know (maybe tomorrow after she rests) that I can certainly have Dr. Tomi Likens evaluate her, however if insurance won't cover it, he would not be able to do it as well  Thanks KA ----- Message -----    From: Thurmon Fair, CMA    Sent: 10/30/2014  10:53 AM      To: Cameron Sprang, MD Subject: RE: can you pls check                          I asked Danae Chen about this one, she said that her insurance wouldn't cover Botox. ----- Message -----    From: Cameron Sprang, MD    Sent: 10/30/2014   8:15 AM      To: Thurmon Fair, CMA Subject: can you pls check                              Good morning, she is coming today to discuss Botox that she wants done with Dr. Tomi Likens. Looking back at notes, in July it appears we tried to set that up, can you pls check records and see where it led or stopped?  Thanks

## 2014-11-06 DIAGNOSIS — S93601A Unspecified sprain of right foot, initial encounter: Secondary | ICD-10-CM | POA: Diagnosis not present

## 2014-12-11 DIAGNOSIS — G4700 Insomnia, unspecified: Secondary | ICD-10-CM | POA: Diagnosis not present

## 2014-12-11 DIAGNOSIS — F331 Major depressive disorder, recurrent, moderate: Secondary | ICD-10-CM | POA: Diagnosis not present

## 2014-12-11 DIAGNOSIS — F419 Anxiety disorder, unspecified: Secondary | ICD-10-CM | POA: Diagnosis not present

## 2014-12-11 DIAGNOSIS — G43909 Migraine, unspecified, not intractable, without status migrainosus: Secondary | ICD-10-CM | POA: Diagnosis not present

## 2014-12-11 DIAGNOSIS — G8929 Other chronic pain: Secondary | ICD-10-CM | POA: Diagnosis not present

## 2014-12-27 DIAGNOSIS — E559 Vitamin D deficiency, unspecified: Secondary | ICD-10-CM | POA: Diagnosis not present

## 2014-12-27 DIAGNOSIS — I10 Essential (primary) hypertension: Secondary | ICD-10-CM | POA: Diagnosis not present

## 2014-12-27 DIAGNOSIS — E538 Deficiency of other specified B group vitamins: Secondary | ICD-10-CM | POA: Diagnosis not present

## 2014-12-27 DIAGNOSIS — E785 Hyperlipidemia, unspecified: Secondary | ICD-10-CM | POA: Diagnosis not present

## 2014-12-27 DIAGNOSIS — R5383 Other fatigue: Secondary | ICD-10-CM | POA: Diagnosis not present

## 2015-01-05 ENCOUNTER — Encounter: Payer: Self-pay | Admitting: Neurology

## 2015-01-05 ENCOUNTER — Ambulatory Visit (INDEPENDENT_AMBULATORY_CARE_PROVIDER_SITE_OTHER): Payer: Medicare Other | Admitting: Neurology

## 2015-01-05 VITALS — BP 110/80 | HR 83 | Resp 16 | Ht 63.0 in | Wt 145.0 lb

## 2015-01-05 DIAGNOSIS — IMO0002 Reserved for concepts with insufficient information to code with codable children: Secondary | ICD-10-CM

## 2015-01-05 DIAGNOSIS — R51 Headache: Secondary | ICD-10-CM

## 2015-01-05 DIAGNOSIS — G43709 Chronic migraine without aura, not intractable, without status migrainosus: Secondary | ICD-10-CM

## 2015-01-05 DIAGNOSIS — F329 Major depressive disorder, single episode, unspecified: Secondary | ICD-10-CM | POA: Diagnosis not present

## 2015-01-05 DIAGNOSIS — F32A Depression, unspecified: Secondary | ICD-10-CM

## 2015-01-05 DIAGNOSIS — R519 Headache, unspecified: Secondary | ICD-10-CM

## 2015-01-05 NOTE — Patient Instructions (Signed)
1. Refer to Psychiatry for management of depression 2. Call our office once you decide on Botox 3. Follow-up in 6 months

## 2015-01-05 NOTE — Progress Notes (Signed)
NEUROLOGY FOLLOW UP OFFICE NOTE  Tracey Armstrong [id number]  HISTORY OF PRESENT ILLNESS: I had the pleasure of seeing Tracey Armstrong in follow-up in the neurology clinic on 01/05/2015.  The patient was last seen 8 months ago for chronic migraines. She was asking about new options for migraine prophylaxis. She had been taking Verapamil and Gabapentin. We had discussed she has not tried beta-blocker, nortriptyline, Effexor. She had done Botox in the past and we also discussed restarting this. She started nortriptyline but felt she had more headaches and stopped the medication. She reports the only medication that helps her aside from Maxalt is Norco 5-325mg , "it acts like an antidepressant, helps with my shakiness and depression." She take 1-1/2 tablets daily. This helps her sleep, in addition to the Xanax.   Headaches are throbbing and pressure pain in the right hemisphere, worse when lying down, better when standing up. She has had shakiness in both hands, left>right, with numbness in the fingers of her left hand. She had a bout of bad headaches for 4 days, she was thinking of going to the ER, then got better after a few hours. She feels she is depressed. She fell in January and fractured her right foot. Her vitamin D is low but she has had side effects on replacement treatment. She is also concerned about "water dripping from my nose."  HPI: This is a 80 yo RH woman with a history of depression, anxiety, chronic low back pain, and chronic migraines. She would live in [place] several months of the year, and saw a neurologist there, as well as Dr. Domingo Cocking at [facility] in [place]. She reports having migraines since age 49, with throbbing and pressure pain in the right retro-orbital region radiating to the back of her head, occurring around twice a week where she has to stay in bed. She would take Maxalt MLT which helps within an hour. There is associated nausea, photo and  phonophobia, no visual aura or focal numbness/tingling/weakness. Over the past 2 years, she has had a different type of pressure-like headache localized over the right parietal region occurring 3-4 times a week. Only Norco would help with this, she takes 1/2 tablet BID, but sometimes would take an extra half tablet. It appears she had problems with medication overuse in the past, she brings a sheet of paper where she was instructed by Dr. Domingo Cocking to stop Darvocet. She has tried trigger point injections with minimal effect. She recalls having Botox in 2009 which caused significant neck weakness. Her neurologist Dr. Patrice Paradise in [facility] did a retrial of Botox last 09/2013 which she feels helped. She has been taking Verapamil and Gabapentin for headache prophylaxis, and feels the gabapentin helps her more. She take gabapentin 200mg  in AM, 300mg  in PM. She does not recall trying any beta-blockers in the past. She had side effects on Topamax, amitriptyline, and Pristiq. She has not tried Depakote but is afraid of the side effects. She has not tried Effexor due to unrecalled side effects on Pristiq. She endorses depressive symptoms and is interested in trying a prophylactic medication with an anti-depressant. She has tried Cymbalta, amitriptyline, Trazodone, and Imipramine in the past which would worsen her headaches within the 3rd week.   There is a strong family history of migraines in her grandmother, maternal aunt, father, and 5 nieces. Migraine triggers include weather changes, heat, and air blowing in her face. She reports that sleep is poor.   I personally  reviewed MRI brain without contrast which was unremarkable, there was note of focal bony thickening in the right parietal bone, unchanged from the prior study. There was mild enhancement of this area previously and is most likely is a small meningioma, stable.  PAST MEDICAL HISTORY: Past Medical History  Diagnosis Date  . Migraine   . GERD  (gastroesophageal reflux disease)   . Hypertelorism     MEDICATIONS: Current Outpatient Prescriptions on File Prior to Visit  Medication Sig Dispense Refill  . alprazolam (XANAX) 2 MG tablet Take 2 mg by mouth. 2-3 TABLETS PO QD    . gabapentin (NEURONTIN) 100 MG capsule Take 100 mg by mouth. 2 PO Q AM    . gabapentin (NEURONTIN) 300 MG capsule Take 300 mg by mouth at bedtime.    Marland Kitchen HYDROcodone-acetaminophen (NORCO/VICODIN) 5-325 MG per tablet Take 1 tablet by mouth every 6 (six) hours as needed for moderate pain.    . Magnesium 250 MG TABS Take 250 mg by mouth daily. 2 PO DAILY    . rizatriptan (MAXALT-MLT) 10 MG disintegrating tablet Take 10 mg by mouth as needed for migraine. May repeat in 2 hours if needed     No current facility-administered medications on file prior to visit.    ALLERGIES: No Known Allergies  FAMILY HISTORY: Family History  Problem Relation Age of Onset  . Migraines Father   . Heart Problems Mother     SOCIAL HISTORY: History   Social History  . Marital Status: Legally Separated    Spouse Name: N/A  . Number of Children: N/A  . Years of Education: N/A   Occupational History  . Not on file.   Social History Main Topics  . Smoking status: Never Smoker   . Smokeless tobacco: Not on file  . Alcohol Use: No  . Drug Use: No  . Sexual Activity: Not on file   Other Topics Concern  . Not on file   Social History Narrative    REVIEW OF SYSTEMS: Constitutional: No fevers, chills, or sweats, no generalized fatigue, change in appetite Eyes: No visual changes, double vision, eye pain Ear, nose and throat: No hearing loss, ear pain, nasal congestion, sore throat Cardiovascular: No chest pain, palpitations Respiratory:  No shortness of breath at rest or with exertion, wheezes GastrointestinaI: No nausea, vomiting, diarrhea, abdominal pain, fecal incontinence Genitourinary:  No dysuria, urinary retention or frequency Musculoskeletal:  + neck pain, back  pain Integumentary: No rash, pruritus, skin lesions Neurological: as above Psychiatric: + depression, insomnia, anxiety Endocrine: No palpitations, fatigue, diaphoresis, mood swings, change in appetite, change in weight, increased thirst Hematologic/Lymphatic:  No anemia, purpura, petechiae. Allergic/Immunologic: no itchy/runny eyes, nasal congestion, recent allergic reactions, rashes  PHYSICAL EXAM: Filed Vitals:   01/05/15 1123  BP: 110/80  Pulse: 83  Resp: 16   General: No acute distress Head:  Normocephalic/atraumatic Neck: supple, no paraspinal tenderness, full range of motion Heart:  Regular rate and rhythm Lungs:  Clear to auscultation bilaterally Back: No paraspinal tenderness Skin/Extremities: No rash, no edema Neurological Exam: alert and oriented to person, place, and time. No aphasia or dysarthria. Fund of knowledge is appropriate.  Recent and remote memory are intact.  Attention and concentration are normal.    Able to name objects and repeat phrases. Cranial nerves: Pupils equal, round, reactive to light.  Fundoscopic exam unremarkable, no papilledema. Extraocular movements intact with no nystagmus. Visual fields full. Facial sensation intact. No facial asymmetry. Tongue, uvula, palate midline.  Motor: Bulk  and tone normal, muscle strength 5/5 throughout with no pronator drift.  Sensation to light touch intact.  No extinction to double simultaneous stimulation.  Deep tendon reflexes 2+ throughout, toes downgoing.  Finger to nose testing intact.  Gait narrow-based and steady, mild difficulty with tandem walk but able, no ataxia.  Romberg negative.  IMPRESSION: This is a 80 yo RH woman with a history of depression, anxiety, chronic back pain, and chronic migraines, likely with component of medication overuse headaches due to daily intake of Norco. She reports this helps as an antidepressant for her. She is taking Verapamil and gabapentin for headache prophylaxis. Headaches have  quieted down, she had called our office one time to consider Botox again. She did not tolerate nortriptyline. We again discussed the option of Botox for the chronic migraines, she will call our office when symptoms worsen. I discussed with her the indication for Norco, it is not an antidepressant, she would benefit from seeing a psychiatrist to discuss newer options for antidepressants. She will follow-up in 6 months and knows to call our office for any changes.  Thank you for allowing me to participate in her care.  Please do not hesitate to call for any questions or concerns.  The duration of this appointment visit was 15 minutes of face-to-face time with the patient.  Greater than 50% of this time was spent in counseling, explanation of diagnosis, planning of further management, and coordination of care.   Ellouise Newer, M.D.   CC: Dr. Justin Mend

## 2015-01-10 ENCOUNTER — Encounter: Payer: Self-pay | Admitting: Neurology

## 2015-01-10 DIAGNOSIS — G43709 Chronic migraine without aura, not intractable, without status migrainosus: Secondary | ICD-10-CM | POA: Insufficient documentation

## 2015-01-10 DIAGNOSIS — IMO0002 Reserved for concepts with insufficient information to code with codable children: Secondary | ICD-10-CM | POA: Insufficient documentation

## 2015-01-29 ENCOUNTER — Ambulatory Visit: Payer: Medicare Other | Admitting: Neurology

## 2015-02-14 DIAGNOSIS — R51 Headache: Secondary | ICD-10-CM | POA: Diagnosis not present

## 2015-02-14 DIAGNOSIS — I10 Essential (primary) hypertension: Secondary | ICD-10-CM | POA: Diagnosis not present

## 2015-02-14 DIAGNOSIS — F331 Major depressive disorder, recurrent, moderate: Secondary | ICD-10-CM | POA: Diagnosis not present

## 2015-02-14 DIAGNOSIS — F419 Anxiety disorder, unspecified: Secondary | ICD-10-CM | POA: Diagnosis not present

## 2015-02-14 DIAGNOSIS — G8929 Other chronic pain: Secondary | ICD-10-CM | POA: Diagnosis not present

## 2015-03-28 ENCOUNTER — Ambulatory Visit: Payer: Medicare Other | Admitting: Neurology

## 2015-03-30 ENCOUNTER — Ambulatory Visit (INDEPENDENT_AMBULATORY_CARE_PROVIDER_SITE_OTHER): Payer: Medicare Other | Admitting: Neurology

## 2015-03-30 ENCOUNTER — Encounter: Payer: Self-pay | Admitting: Neurology

## 2015-03-30 VITALS — BP 127/76 | HR 72 | Ht 63.0 in | Wt 142.4 lb

## 2015-03-30 DIAGNOSIS — F5104 Psychophysiologic insomnia: Secondary | ICD-10-CM

## 2015-03-30 DIAGNOSIS — G43709 Chronic migraine without aura, not intractable, without status migrainosus: Secondary | ICD-10-CM | POA: Diagnosis not present

## 2015-03-30 DIAGNOSIS — G4700 Insomnia, unspecified: Secondary | ICD-10-CM

## 2015-03-30 DIAGNOSIS — IMO0002 Reserved for concepts with insufficient information to code with codable children: Secondary | ICD-10-CM

## 2015-03-30 HISTORY — DX: Psychophysiologic insomnia: F51.04

## 2015-03-30 MED ORDER — RIBOFLAVIN 400 MG PO CAPS: 400.0000 mg | ORAL_CAPSULE | Freq: Every day | ORAL | Status: DC

## 2015-03-30 MED ORDER — VERAPAMIL HCL ER 180 MG PO TBCR: 180.0000 mg | EXTENDED_RELEASE_TABLET | Freq: Every day | ORAL | Status: DC

## 2015-03-30 NOTE — Patient Instructions (Signed)

## 2015-03-30 NOTE — Progress Notes (Signed)
Reason for visit: Migraine headache  Referring physician: Dr. Kingsley Callander is a 80 y.o. female  History of present illness:  Ms. Himmelberger is a 80 year old right-handed white female with a history of chronic migraine headaches since age 55. As she has gotten older, the headaches have become more frequent and more severe. In the past, she has been seen through the [facility], and she received Botox injections, but this resulted in cervical paraspinal muscle weakness that was transient. The patient has more recently seen Dr. Delice Lesch from [facility] neurology. The patient has been on a multitude of medications in the past that include nortriptyline, Cymbalta, trazodone, and imipramine without good tolerance or effectiveness. The patient currently is on verapamil at the 120 mg dose, with some benefit. The patient also takes gabapentin, but she indicates that this is making her balance poor, she has had multiple falls. The patient reports that her headaches generally will start around the right eye, and spread into the right parietal and occipital area. The patient denies any significant nausea or vomiting, but she does have photophobia and phonophobia. Maxalt will usually help these headaches. The patient has had a different type of headache recently with a crawling sensation that begins her around the right ear, and goes up into the right parietal area. Maxalt does not help this as much. The patient is having about 10 headache days a month. Her headaches when they do occur may last 2-3 days. The patient indicates that she is becoming afraid of going out of the house in fear of getting a headache. The activators for the headache include weather changes, and certain odors. She denies any focal numbness or weakness on the face, arms, or legs. She is sent to this office for an evaluation. The patient reports that over the last 4 or 5 months, she has had clear drainage from the nasal  passageways during some of her headaches. Otherwise, she does not have nasal drainage.  Past Medical History  Diagnosis Date  . Migraine   . GERD (gastroesophageal reflux disease)   . Hypertelorism   . Hypertension   . Anxiety   . Depression   . Hiatal hernia   . Chronic insomnia 03/30/2015    Past Surgical History  Procedure Laterality Date  . Stomach surgery      Family History  Problem Relation Age of Onset  . Migraines Father   . Stroke Father   . Heart Problems Mother     Social history:  reports that she has never smoked. She has never used smokeless tobacco. She reports that she does not drink alcohol or use illicit drugs.  Medications:  Prior to Admission medications   Medication Sig Start Date End Date Taking? Authorizing Provider  alprazolam Duanne Moron) 2 MG tablet Take 2 mg by mouth. 2-3 TABLETS PO QD   Yes Historical Provider, MD  gabapentin (NEURONTIN) 100 MG capsule Take 100 mg by mouth. 2 PO Q AM   Yes Historical Provider, MD  gabapentin (NEURONTIN) 300 MG capsule Take 300 mg by mouth at bedtime.   Yes Historical Provider, MD  HYDROcodone-acetaminophen (NORCO/VICODIN) 5-325 MG per tablet Take 1 tablet by mouth every 6 (six) hours as needed for moderate pain.   Yes Historical Provider, MD  Magnesium 250 MG TABS Take 250 mg by mouth daily. 2 PO DAILY   Yes Historical Provider, MD  omeprazole (PRILOSEC) 20 MG capsule Take 20 mg by mouth daily.   Yes Historical  Provider, MD  rizatriptan (MAXALT-MLT) 10 MG disintegrating tablet Take 10 mg by mouth as needed for migraine. May repeat in 2 hours if needed   Yes Historical Provider, MD  tretinoin (RETIN-A) 0.1 % cream Apply topically at bedtime.   Yes Historical Provider, MD  verapamil (VERELAN PM) 120 MG 24 hr capsule 120 mg. Take 1 tablet daily 10/23/14  Yes Historical Provider, MD      Allergies  Allergen Reactions  . Trazodone And Nefazodone   . Vitamin D Analogs     ROS:  Out of a complete 14 system review of  symptoms, the patient complains only of the following symptoms, and all other reviewed systems are negative.  Fatigue Ringing in the ears, difficulty swallowing Blurred vision Shortness of breath Feeling cold, increased thirst Achy muscles Runny nose Memory loss, confusion, headache, numbness, weakness, slurred speech, difficulty swallowing, dizziness, tremor Gait disturbance Depression, anxiety, chronic insomnia Disinterest in activities  Blood pressure 127/76, pulse 72, height 5\' 3"  (1.6 m), weight 142 lb 6.4 oz (64.592 kg).  Physical Exam  General: The patient is alert and cooperative at the time of the examination.  Eyes: Pupils are equal, round, and reactive to light. Discs are flat bilaterally.  Neck: The neck is supple, no carotid bruits are noted.  Respiratory: The respiratory examination is clear.  Cardiovascular: The cardiovascular examination reveals a regular rate and rhythm, no obvious murmurs or rubs are noted.  Skin: Extremities are without significant edema.  Neurologic Exam  Mental status: The patient is alert and oriented x 3 at the time of the examination. The patient has apparent normal recent and remote memory, with an apparently normal attention span and concentration ability.  Cranial nerves: Facial symmetry is present. There is good sensation of the face to pinprick and soft touch bilaterally. The strength of the facial muscles and the muscles to head turning and shoulder shrug are normal bilaterally. Speech is well enunciated, no aphasia or dysarthria is noted. Extraocular movements are full. Visual fields are full. The tongue is midline, and the patient has symmetric elevation of the soft palate. No obvious hearing deficits are noted.  Motor: The motor testing reveals 5 over 5 strength of all 4 extremities. Good symmetric motor tone is noted throughout.  Sensory: Sensory testing is intact to pinprick, soft touch, vibration sensation, and position sense  on all 4 extremities, with exception that there is a stocking pattern pinprick sensory deficit two thirds way up the legs below the knees. No evidence of extinction is noted.  Coordination: Cerebellar testing reveals good finger-nose-finger and heel-to-shin bilaterally.  Gait and station: Gait is normal. Tandem gait is normal. Romberg is negative. No drift is seen.  Reflexes: Deep tendon reflexes are symmetric and normal bilaterally, with exception that the ankle jerk reflexes are depressed. Toes are downgoing bilaterally.   Assessment/Plan:  1. Intractable migraine headache  2. Chronic insomnia   The patient is having ongoing issues with chronic insomnia. She will start back on trazodone taking 50 mg at night. The gabapentin will be discontinued secondary to the problems with gait instability and falls. The verapamil seems offer some benefit, this will be increased to a 180 mg daily dose. The patient will continue the Maxalt to take if needed for the headache. In the future, the patient may be a candidate for Sphenocath treatments. She will follow-up in about 4 months. The patient is to go on riboflavin taking 400 mg daily.   Jill Alexanders MD 03/30/2015 6:31  PM  [facility] [address] [place], [place] [address]  Phone [phone] Fax [phone]

## 2015-04-02 ENCOUNTER — Telehealth: Payer: Self-pay | Admitting: Neurology

## 2015-04-02 NOTE — Telephone Encounter (Signed)
I called the patient. She just wanted to let Dr. Jannifer Franklin know the changes she has made in her medications. She also stated that when Dr. Jannifer Franklin asked her in her OV about how many headaches she has per month, she said 10. She stated she actually has about 20 and she wanted to make sure he knew this.

## 2015-04-02 NOTE — Telephone Encounter (Signed)
Headache frequency noted.

## 2015-04-02 NOTE — Addendum Note (Signed)
Addended by: Margette Fast on: 04/02/2015 05:40 PM   Modules accepted: Orders, Medications

## 2015-04-02 NOTE — Telephone Encounter (Signed)
Patient called stating she took the traZODone (DESYREL) 50 MG tablet Friday night(03/30/15) and awoke with a severe migraine during the night. She states she is not going to take this medication.  She is still going to decrease the gabapentin. She has self started generic Wellbutrin 100mg  x 2 day(prescibed by Maurice Small).  She also wanted to let Dr Jannifer Franklin know as discussed at last ov if she adds pain in the top of her head she would be having 20 migraines/month instead of 10.  Please call and advise. Patient can be reached at [phone] and [phone].

## 2015-05-15 DIAGNOSIS — G43019 Migraine without aura, intractable, without status migrainosus: Secondary | ICD-10-CM | POA: Diagnosis not present

## 2015-05-15 DIAGNOSIS — M5481 Occipital neuralgia: Secondary | ICD-10-CM | POA: Diagnosis not present

## 2015-05-15 DIAGNOSIS — G43719 Chronic migraine without aura, intractable, without status migrainosus: Secondary | ICD-10-CM | POA: Diagnosis not present

## 2015-05-15 DIAGNOSIS — Z8249 Family history of ischemic heart disease and other diseases of the circulatory system: Secondary | ICD-10-CM | POA: Diagnosis not present

## 2015-05-15 DIAGNOSIS — R51 Headache: Secondary | ICD-10-CM | POA: Diagnosis not present

## 2015-05-15 DIAGNOSIS — G629 Polyneuropathy, unspecified: Secondary | ICD-10-CM | POA: Diagnosis not present

## 2015-06-01 DIAGNOSIS — G44209 Tension-type headache, unspecified, not intractable: Secondary | ICD-10-CM | POA: Diagnosis not present

## 2015-06-01 DIAGNOSIS — M542 Cervicalgia: Secondary | ICD-10-CM | POA: Diagnosis not present

## 2015-06-01 DIAGNOSIS — Z8249 Family history of ischemic heart disease and other diseases of the circulatory system: Secondary | ICD-10-CM | POA: Diagnosis not present

## 2015-06-01 DIAGNOSIS — I6782 Cerebral ischemia: Secondary | ICD-10-CM | POA: Diagnosis not present

## 2015-06-01 DIAGNOSIS — R9082 White matter disease, unspecified: Secondary | ICD-10-CM | POA: Diagnosis not present

## 2015-06-01 DIAGNOSIS — R51 Headache: Secondary | ICD-10-CM | POA: Diagnosis not present

## 2015-06-01 DIAGNOSIS — G939 Disorder of brain, unspecified: Secondary | ICD-10-CM | POA: Diagnosis not present

## 2015-06-15 DIAGNOSIS — G479 Sleep disorder, unspecified: Secondary | ICD-10-CM | POA: Diagnosis not present

## 2015-06-15 DIAGNOSIS — R51 Headache: Secondary | ICD-10-CM | POA: Diagnosis not present

## 2015-06-18 DIAGNOSIS — G43719 Chronic migraine without aura, intractable, without status migrainosus: Secondary | ICD-10-CM | POA: Diagnosis not present

## 2015-06-18 DIAGNOSIS — G43909 Migraine, unspecified, not intractable, without status migrainosus: Secondary | ICD-10-CM | POA: Diagnosis not present

## 2015-06-20 DIAGNOSIS — D320 Benign neoplasm of cerebral meninges: Secondary | ICD-10-CM | POA: Diagnosis not present

## 2015-06-20 DIAGNOSIS — D329 Benign neoplasm of meninges, unspecified: Secondary | ICD-10-CM | POA: Insufficient documentation

## 2015-07-30 DIAGNOSIS — Z91018 Allergy to other foods: Secondary | ICD-10-CM | POA: Diagnosis not present

## 2015-07-30 DIAGNOSIS — E559 Vitamin D deficiency, unspecified: Secondary | ICD-10-CM | POA: Diagnosis not present

## 2015-07-30 DIAGNOSIS — M1990 Unspecified osteoarthritis, unspecified site: Secondary | ICD-10-CM | POA: Diagnosis not present

## 2015-07-30 DIAGNOSIS — G43909 Migraine, unspecified, not intractable, without status migrainosus: Secondary | ICD-10-CM | POA: Diagnosis not present

## 2015-07-30 DIAGNOSIS — I10 Essential (primary) hypertension: Secondary | ICD-10-CM | POA: Diagnosis not present

## 2015-08-02 DIAGNOSIS — G43709 Chronic migraine without aura, not intractable, without status migrainosus: Secondary | ICD-10-CM | POA: Diagnosis not present

## 2015-08-16 DIAGNOSIS — R269 Unspecified abnormalities of gait and mobility: Secondary | ICD-10-CM | POA: Diagnosis not present

## 2015-08-16 DIAGNOSIS — R51 Headache: Secondary | ICD-10-CM | POA: Diagnosis not present

## 2015-08-27 DIAGNOSIS — R269 Unspecified abnormalities of gait and mobility: Secondary | ICD-10-CM | POA: Diagnosis not present

## 2015-08-27 DIAGNOSIS — G44229 Chronic tension-type headache, not intractable: Secondary | ICD-10-CM | POA: Diagnosis not present

## 2015-08-29 DIAGNOSIS — R51 Headache: Secondary | ICD-10-CM | POA: Diagnosis not present

## 2015-08-29 DIAGNOSIS — G44229 Chronic tension-type headache, not intractable: Secondary | ICD-10-CM | POA: Diagnosis not present

## 2015-08-29 DIAGNOSIS — R269 Unspecified abnormalities of gait and mobility: Secondary | ICD-10-CM | POA: Diagnosis not present

## 2015-09-03 DIAGNOSIS — G44229 Chronic tension-type headache, not intractable: Secondary | ICD-10-CM | POA: Diagnosis not present

## 2015-09-03 DIAGNOSIS — R269 Unspecified abnormalities of gait and mobility: Secondary | ICD-10-CM | POA: Diagnosis not present

## 2015-09-03 DIAGNOSIS — R51 Headache: Secondary | ICD-10-CM | POA: Diagnosis not present

## 2015-09-06 DIAGNOSIS — R51 Headache: Secondary | ICD-10-CM | POA: Diagnosis not present

## 2015-09-06 DIAGNOSIS — G44229 Chronic tension-type headache, not intractable: Secondary | ICD-10-CM | POA: Diagnosis not present

## 2015-09-06 DIAGNOSIS — R269 Unspecified abnormalities of gait and mobility: Secondary | ICD-10-CM | POA: Diagnosis not present

## 2015-09-10 DIAGNOSIS — G43709 Chronic migraine without aura, not intractable, without status migrainosus: Secondary | ICD-10-CM | POA: Diagnosis not present

## 2015-09-18 DIAGNOSIS — R51 Headache: Secondary | ICD-10-CM | POA: Diagnosis not present

## 2015-09-18 DIAGNOSIS — R269 Unspecified abnormalities of gait and mobility: Secondary | ICD-10-CM | POA: Diagnosis not present

## 2015-09-18 DIAGNOSIS — G44229 Chronic tension-type headache, not intractable: Secondary | ICD-10-CM | POA: Diagnosis not present

## 2015-09-26 DIAGNOSIS — R269 Unspecified abnormalities of gait and mobility: Secondary | ICD-10-CM | POA: Diagnosis not present

## 2015-09-26 DIAGNOSIS — R51 Headache: Secondary | ICD-10-CM | POA: Diagnosis not present

## 2015-09-26 DIAGNOSIS — G44229 Chronic tension-type headache, not intractable: Secondary | ICD-10-CM | POA: Diagnosis not present

## 2015-11-06 DIAGNOSIS — G43909 Migraine, unspecified, not intractable, without status migrainosus: Secondary | ICD-10-CM | POA: Diagnosis not present

## 2015-11-06 DIAGNOSIS — G8929 Other chronic pain: Secondary | ICD-10-CM | POA: Diagnosis not present

## 2015-11-08 ENCOUNTER — Other Ambulatory Visit (HOSPITAL_COMMUNITY): Payer: Self-pay | Admitting: Family Medicine

## 2015-11-08 ENCOUNTER — Telehealth (HOSPITAL_COMMUNITY): Payer: Self-pay | Admitting: *Deleted

## 2015-11-08 DIAGNOSIS — R5383 Other fatigue: Secondary | ICD-10-CM

## 2015-11-09 ENCOUNTER — Ambulatory Visit (HOSPITAL_COMMUNITY): Payer: Medicare Other | Attending: Cardiovascular Disease

## 2015-11-09 DIAGNOSIS — Z8249 Family history of ischemic heart disease and other diseases of the circulatory system: Secondary | ICD-10-CM | POA: Diagnosis not present

## 2015-11-09 DIAGNOSIS — R5383 Other fatigue: Secondary | ICD-10-CM | POA: Diagnosis not present

## 2015-11-09 DIAGNOSIS — I10 Essential (primary) hypertension: Secondary | ICD-10-CM | POA: Insufficient documentation

## 2015-11-09 DIAGNOSIS — E785 Hyperlipidemia, unspecified: Secondary | ICD-10-CM | POA: Insufficient documentation

## 2015-11-20 ENCOUNTER — Other Ambulatory Visit (HOSPITAL_COMMUNITY): Payer: Medicare Other

## 2015-11-27 DIAGNOSIS — H2513 Age-related nuclear cataract, bilateral: Secondary | ICD-10-CM | POA: Diagnosis not present

## 2015-11-27 DIAGNOSIS — H52203 Unspecified astigmatism, bilateral: Secondary | ICD-10-CM | POA: Diagnosis not present

## 2015-11-27 DIAGNOSIS — H04123 Dry eye syndrome of bilateral lacrimal glands: Secondary | ICD-10-CM | POA: Diagnosis not present

## 2015-11-29 DIAGNOSIS — G43719 Chronic migraine without aura, intractable, without status migrainosus: Secondary | ICD-10-CM | POA: Diagnosis not present

## 2015-11-29 DIAGNOSIS — G971 Other reaction to spinal and lumbar puncture: Secondary | ICD-10-CM | POA: Diagnosis not present

## 2016-01-21 DIAGNOSIS — H04123 Dry eye syndrome of bilateral lacrimal glands: Secondary | ICD-10-CM | POA: Diagnosis not present

## 2016-03-21 ENCOUNTER — Other Ambulatory Visit: Payer: Self-pay | Admitting: Specialist

## 2016-03-21 DIAGNOSIS — R51 Headache: Secondary | ICD-10-CM

## 2016-03-21 DIAGNOSIS — G43719 Chronic migraine without aura, intractable, without status migrainosus: Secondary | ICD-10-CM | POA: Diagnosis not present

## 2016-03-21 DIAGNOSIS — R519 Headache, unspecified: Secondary | ICD-10-CM

## 2016-03-21 DIAGNOSIS — G971 Other reaction to spinal and lumbar puncture: Secondary | ICD-10-CM | POA: Diagnosis not present

## 2016-03-21 DIAGNOSIS — M542 Cervicalgia: Secondary | ICD-10-CM

## 2016-03-26 ENCOUNTER — Ambulatory Visit
Admission: RE | Admit: 2016-03-26 | Discharge: 2016-03-26 | Disposition: A | Discharge status: Home / Self Care | Payer: Medicare Other | Source: Ambulatory Visit | Attending: Specialist | Admitting: Specialist

## 2016-03-26 DIAGNOSIS — R51 Headache: Secondary | ICD-10-CM

## 2016-03-26 DIAGNOSIS — K209 Esophagitis, unspecified without bleeding: Secondary | ICD-10-CM | POA: Insufficient documentation

## 2016-03-26 DIAGNOSIS — I10 Essential (primary) hypertension: Secondary | ICD-10-CM | POA: Insufficient documentation

## 2016-03-26 DIAGNOSIS — R519 Headache, unspecified: Secondary | ICD-10-CM

## 2016-03-26 DIAGNOSIS — M542 Cervicalgia: Secondary | ICD-10-CM

## 2016-03-26 DIAGNOSIS — R109 Unspecified abdominal pain: Secondary | ICD-10-CM | POA: Insufficient documentation

## 2016-03-26 DIAGNOSIS — M5126 Other intervertebral disc displacement, lumbar region: Secondary | ICD-10-CM | POA: Diagnosis not present

## 2016-03-26 DIAGNOSIS — M5430 Sciatica, unspecified side: Secondary | ICD-10-CM | POA: Insufficient documentation

## 2016-03-26 DIAGNOSIS — K589 Irritable bowel syndrome without diarrhea: Secondary | ICD-10-CM | POA: Insufficient documentation

## 2016-03-26 DIAGNOSIS — M5031 Other cervical disc degeneration,  high cervical region: Secondary | ICD-10-CM | POA: Diagnosis not present

## 2016-03-26 MED ORDER — IOPAMIDOL (ISOVUE-300) INJECTION 61%
10.0000 mL | Freq: Once | INTRAVENOUS | Status: AC | PRN
  Administered 2016-03-26: 10 mL

## 2016-03-26 MED ORDER — ONDANSETRON HCL 4 MG/2ML IJ SOLN
4.0000 mg | Freq: Once | INTRAMUSCULAR | Status: AC
  Administered 2016-03-26: 4 mg via INTRAMUSCULAR

## 2016-03-26 MED ORDER — MEPERIDINE HCL 100 MG/ML IJ SOLN
75.0000 mg | Freq: Once | INTRAMUSCULAR | Status: AC
  Administered 2016-03-26: 75 mg via INTRAMUSCULAR

## 2016-03-26 NOTE — Discharge Instructions (Addendum)
Myelogram Discharge Instructions  1. Go home and rest quietly for the next 24 hours.  It is important to lie flat for the next 24 hours.  Get up only to go to the restroom.  You may lie in the bed or on a couch on your back, your stomach, your left side or your right side.  You may have one pillow under your head.  You may have pillows between your knees while you are on your side or under your knees while you are on your back.  2. DO NOT drive today.  Recline the seat as far back as it will go, while still wearing your seat belt, on the way home.  3. You may get up to go to the bathroom as needed.  You may sit up for 10 minutes to eat.  You may resume your normal diet and medications unless otherwise indicated.  Drink lots of extra fluids today and tomorrow.  4. The incidence of headache, nausea, or vomiting is about 5% (one in 20 patients).  If you develop a headache, lie flat and drink plenty of fluids until the headache goes away.  Caffeinated beverages may be helpful.  If you develop severe nausea and vomiting or a headache that does not go away with flat bed rest, call [phone].  5. You may resume normal activities after your 24 hours of bed rest is over; however, do not exert yourself strongly or do any heavy lifting tomorrow. If when you get up you have a headache when standing, go back to bed and force fluids for another 24 hours.  6. Call your physician for a follow-up appointment.  The results of your myelogram will be sent directly to your physician by the following day.  7. If you have any questions or if complications develop after you arrive home, please call [phone].  Discharge instructions have been explained to the patient.  The patient, or the person responsible for the patient, fully understands these instructions.      May resume Maxalt on March 27, 2016, after 11:30 am.

## 2016-03-26 NOTE — Progress Notes (Addendum)
Patient states she has been off Maxalt for at least the past two days.  Patient prefers to take own Xanax 1mg  PO instead of our Valium which she states gives her a headache.  Brita Romp, RN

## 2016-04-29 DIAGNOSIS — D329 Benign neoplasm of meninges, unspecified: Secondary | ICD-10-CM | POA: Diagnosis not present

## 2016-04-29 DIAGNOSIS — F329 Major depressive disorder, single episode, unspecified: Secondary | ICD-10-CM | POA: Diagnosis not present

## 2016-04-29 DIAGNOSIS — R51 Headache: Secondary | ICD-10-CM | POA: Diagnosis not present

## 2016-04-29 DIAGNOSIS — G43709 Chronic migraine without aura, not intractable, without status migrainosus: Secondary | ICD-10-CM | POA: Diagnosis not present

## 2016-05-05 DIAGNOSIS — R51 Headache: Secondary | ICD-10-CM | POA: Diagnosis not present

## 2016-05-07 DIAGNOSIS — F5101 Primary insomnia: Secondary | ICD-10-CM | POA: Diagnosis not present

## 2016-05-07 DIAGNOSIS — R300 Dysuria: Secondary | ICD-10-CM | POA: Diagnosis not present

## 2016-05-07 DIAGNOSIS — G8929 Other chronic pain: Secondary | ICD-10-CM | POA: Diagnosis not present

## 2016-05-10 DIAGNOSIS — G960 Cerebrospinal fluid leak: Secondary | ICD-10-CM | POA: Diagnosis not present

## 2016-05-12 DIAGNOSIS — H2513 Age-related nuclear cataract, bilateral: Secondary | ICD-10-CM | POA: Diagnosis not present

## 2016-05-12 DIAGNOSIS — H43813 Vitreous degeneration, bilateral: Secondary | ICD-10-CM | POA: Diagnosis not present

## 2016-05-12 DIAGNOSIS — H25013 Cortical age-related cataract, bilateral: Secondary | ICD-10-CM | POA: Diagnosis not present

## 2016-06-04 DIAGNOSIS — R51 Headache: Secondary | ICD-10-CM | POA: Diagnosis not present

## 2016-06-18 DIAGNOSIS — M549 Dorsalgia, unspecified: Secondary | ICD-10-CM | POA: Diagnosis not present

## 2016-06-18 DIAGNOSIS — D320 Benign neoplasm of cerebral meninges: Secondary | ICD-10-CM | POA: Diagnosis not present

## 2016-06-18 DIAGNOSIS — D329 Benign neoplasm of meninges, unspecified: Secondary | ICD-10-CM | POA: Diagnosis not present

## 2016-06-18 DIAGNOSIS — Z6826 Body mass index (BMI) 26.0-26.9, adult: Secondary | ICD-10-CM | POA: Diagnosis not present

## 2016-06-18 DIAGNOSIS — M4727 Other spondylosis with radiculopathy, lumbosacral region: Secondary | ICD-10-CM | POA: Diagnosis not present

## 2016-06-19 DIAGNOSIS — M549 Dorsalgia, unspecified: Secondary | ICD-10-CM | POA: Diagnosis not present

## 2016-06-20 DIAGNOSIS — M4806 Spinal stenosis, lumbar region: Secondary | ICD-10-CM | POA: Diagnosis not present

## 2016-06-20 DIAGNOSIS — M549 Dorsalgia, unspecified: Secondary | ICD-10-CM | POA: Diagnosis not present

## 2016-06-20 DIAGNOSIS — R51 Headache: Secondary | ICD-10-CM | POA: Diagnosis not present

## 2016-06-20 DIAGNOSIS — D329 Benign neoplasm of meninges, unspecified: Secondary | ICD-10-CM | POA: Diagnosis not present

## 2016-07-02 DIAGNOSIS — D329 Benign neoplasm of meninges, unspecified: Secondary | ICD-10-CM | POA: Diagnosis not present

## 2016-07-02 DIAGNOSIS — M4727 Other spondylosis with radiculopathy, lumbosacral region: Secondary | ICD-10-CM | POA: Diagnosis not present

## 2016-07-02 DIAGNOSIS — Z6827 Body mass index (BMI) 27.0-27.9, adult: Secondary | ICD-10-CM | POA: Diagnosis not present

## 2016-07-02 DIAGNOSIS — M7138 Other bursal cyst, other site: Secondary | ICD-10-CM | POA: Diagnosis not present

## 2016-07-23 DIAGNOSIS — E538 Deficiency of other specified B group vitamins: Secondary | ICD-10-CM | POA: Diagnosis not present

## 2016-07-23 DIAGNOSIS — E559 Vitamin D deficiency, unspecified: Secondary | ICD-10-CM | POA: Diagnosis not present

## 2016-07-23 DIAGNOSIS — R5383 Other fatigue: Secondary | ICD-10-CM | POA: Diagnosis not present

## 2016-07-23 DIAGNOSIS — E785 Hyperlipidemia, unspecified: Secondary | ICD-10-CM | POA: Diagnosis not present

## 2016-07-28 ENCOUNTER — Ambulatory Visit (INDEPENDENT_AMBULATORY_CARE_PROVIDER_SITE_OTHER): Payer: Medicare Other | Admitting: Neurology

## 2016-07-28 ENCOUNTER — Encounter: Payer: Self-pay | Admitting: Neurology

## 2016-07-28 ENCOUNTER — Ambulatory Visit: Payer: Medicare Other | Admitting: Neurology

## 2016-07-28 VITALS — BP 113/68 | HR 74 | Ht 63.0 in | Wt 153.5 lb

## 2016-07-28 DIAGNOSIS — R269 Unspecified abnormalities of gait and mobility: Secondary | ICD-10-CM | POA: Diagnosis not present

## 2016-07-28 DIAGNOSIS — IMO0002 Reserved for concepts with insufficient information to code with codable children: Secondary | ICD-10-CM

## 2016-07-28 DIAGNOSIS — G43709 Chronic migraine without aura, not intractable, without status migrainosus: Secondary | ICD-10-CM | POA: Diagnosis not present

## 2016-07-28 MED ORDER — TIZANIDINE HCL 2 MG PO TABS: 2.0000 mg | ORAL_TABLET | Freq: Every day | ORAL | 2 refills | Status: DC

## 2016-07-28 NOTE — Patient Instructions (Signed)
   We will get physical therapy set up and try tizanidine 2 mg at night. If no benefit we will try occipital nerve injections.

## 2016-07-28 NOTE — Progress Notes (Signed)
Reason for visit: Headache  Referring physician: [name] is a 81 y.o. female  History of present illness:   Tracey Armstrong  Is a 81 year old right-handed white female with a history of migraine headaches that generally are periocular on Tracey right with some radiation into Tracey occipital area. Tracey Armstrong has been seen in June 2016 for these headaches. Tracey Armstrong has been followed through [facility] since that time, Tracey Armstrong has received 2 Botox injections with no benefit. Tracey Armstrong has been on a multitude of medications that include gabapentin, Topamax , nortriptyline, Cymbalta, trazodone, imipramine , and verapamil without much benefit. Tracey Armstrong states that Maxalt will help Tracey headaches, but it made her feel bad. Tracey Armstrong is having headaches almost every night. Tracey headaches generally will start after Tracey Armstrong lays down, Tracey Armstrong is now having pain in Tracey back of Tracey neck on Tracey right and including Tracey right shoulder. Tracey Armstrong will have pain into Tracey right occipital area. Tracey Armstrong reports a throbbing, aching, burning pain. Tracey Armstrong has had a cervical myelogram in Tracey past that shows disc space changes at Tracey C4-5, C5-6 level with facet joint arthritis is well as most prominent off to Tracey left. Tracey Armstrong at times is incapacitated with Tracey headache, unable to get up out of bed. Tracey Armstrong returns for an evaluation.  Past Medical History:  Diagnosis Date  . Anxiety   . Chronic insomnia 03/30/2015  . Depression   . GERD (gastroesophageal reflux disease)   . Hiatal hernia   . High cholesterol   . Hypertelorism   . Hypertension   . Migraine     Past Surgical History:  Procedure Laterality Date  . STOMACH SURGERY      Family History  Problem Relation Age of Onset  . Migraines Father   . Stroke Father   . Heart Problems Mother   . Epilepsy Brother     Social history:  reports that Tracey Armstrong has never smoked. Tracey Armstrong has never used smokeless tobacco. Tracey Armstrong reports that Tracey Armstrong does not drink alcohol or use drugs.  Medications:   Prior to Admission medications   Medication Sig Start Date End Date Taking? Authorizing Provider  alprazolam Tracey Armstrong) 2 MG tablet Take 2 mg by mouth. 2-3 TABLETS PO QD   Yes Historical Provider, MD  gabapentin (NEURONTIN) 300 MG capsule TK 1 C PO TID 05/21/16  Yes Historical Provider, MD  HYDROcodone-acetaminophen (NORCO/VICODIN) 5-325 MG per tablet Take 1 tablet by mouth every 6 (six) hours as needed for moderate pain.   Yes Historical Provider, MD  Magnesium 250 MG TABS Take 250 mg by mouth daily. 2 PO DAILY   Yes Historical Provider, MD  Riboflavin 400 MG CAPS Take 1 capsule (400 mg total) by mouth daily. 03/30/15  Yes Kathrynn Ducking, MD  rizatriptan (MAXALT-MLT) 10 MG disintegrating tablet Take 10 mg by mouth as needed for migraine. May repeat in 2 hours if needed   Yes Historical Provider, MD  Verapamil HCl CR 200 MG CP24 TK 1 C PO QD 07/14/16  Yes Historical Provider, MD      Allergies  Allergen Reactions  . Dihydrotachysterol     Other reaction(s): Other (See Comments)  . Trazodone And Nefazodone Other (See Comments)    Worsens headaches  . Valium [Diazepam] Other (See Comments)    headaches  . Vitamin D Analogs Other (See Comments)    ROS:  Out of a complete 14 system review of symptoms, Tracey Armstrong complains only of Tracey following  symptoms, and all other reviewed systems are negative.   fatigue  Ringing in Tracey ears  Blurred vision, double vision, eye pain  Easy bruising  Feeling hot, cold  Joint pain , achy muscles  Runny nose  Headache, weakness , dizziness  Depression, too much sleep , decreased energy, disinterest in activities  Insomnia  Blood pressure 113/68, pulse 74, height 5\' 3"  (1.6 m), weight 153 lb 8 oz (69.6 kg).  Physical Exam  General: Tracey Armstrong is alert and cooperative at Tracey time of Tracey examination.  Eyes: Pupils are equal, round, and reactive to light. Discs are flat bilaterally.  Neck: Tracey neck is supple, no carotid bruits are  noted.  Respiratory: Tracey respiratory examination is clear.  Cardiovascular: Tracey cardiovascular examination reveals a regular rate and rhythm, no obvious murmurs or rubs are noted.   Neuromuscular: Tracey Armstrong lacks about 25 of full lateral rotation of Tracey cervical spine bilaterally.  Skin: Extremities are without significant edema.  Neurologic Exam  Mental status: Tracey Armstrong is alert and oriented x 3 at Tracey time of Tracey examination. Tracey Armstrong has apparent normal recent and remote memory, with an apparently normal attention span and concentration ability.  Cranial nerves: Facial symmetry is present. There is good sensation of Tracey face to pinprick and soft touch bilaterally. Tracey strength of Tracey facial muscles and Tracey muscles to head turning and shoulder shrug are normal bilaterally. Speech is well enunciated, no aphasia or dysarthria is noted. Extraocular movements are full. Visual fields are full. Tracey tongue is midline, and Tracey Armstrong has symmetric elevation of Tracey soft palate. No obvious hearing deficits are noted.  Motor: Tracey motor testing reveals 5 over 5 strength of all 4 extremities. Good symmetric motor tone is noted throughout.  Sensory: Sensory testing is intact to pinprick, soft touch, vibration sensation, and position sense on all 4 extremities. No evidence of extinction is noted.  Coordination: Cerebellar testing reveals good finger-nose-finger and heel-to-shin bilaterally.  Gait and station: Gait is slightly wide-based. Tandem gait is  unsteady. Romberg is negative, but is unsteady.  Reflexes: Deep tendon reflexes are symmetric and normal bilaterally. Toes are downgoing bilaterally.   Assessment/Plan:   1. History of migraine headache   2. Possible cervicogenic headache, occipital nerve mediated headache   Tracey Armstrong will be given a trial on tizanidine at 2 mg at night, Tracey Armstrong will be set up for physical therapy on Tracey neck. If this is not effective after 4 weeks, Tracey Armstrong is  to contact our office, and we will consider occipital nerve injections on Tracey right. Otherwise, Tracey Armstrong follow-up in 3-4 months.  Jill Alexanders MD 07/28/2016 2:47 PM  [facility] [address] [place], [place] [address]  Phone [phone] Fax [phone]

## 2016-08-15 ENCOUNTER — Ambulatory Visit: Payer: Medicare Other | Attending: Neurology | Admitting: Rehabilitative and Restorative Service Providers"

## 2016-08-15 DIAGNOSIS — M6281 Muscle weakness (generalized): Secondary | ICD-10-CM | POA: Insufficient documentation

## 2016-08-15 DIAGNOSIS — M542 Cervicalgia: Secondary | ICD-10-CM | POA: Insufficient documentation

## 2016-08-15 DIAGNOSIS — R293 Abnormal posture: Secondary | ICD-10-CM | POA: Diagnosis not present

## 2016-08-15 DIAGNOSIS — R269 Unspecified abnormalities of gait and mobility: Secondary | ICD-10-CM | POA: Diagnosis not present

## 2016-08-15 NOTE — Patient Instructions (Signed)
Walking Program:  Begin walking for exercise for 4-5 minutes, 3 times/day, 5 days/week.   Progress your walking program by adding 1-2 minutes to your routine each week, as tolerated. Be sure to wear good walking shoes, walk in a safe environment and only progress to your tolerance.      Try to get up throughout the day (once you are up out of the bed for the morning) and walk in your house.

## 2016-08-18 NOTE — Therapy (Signed)
[facility] [address] [place], [place], [address] Phone: [phone]   Fax:  [phone]  Physical Therapy Evaluation  Patient Details  Name: Tracey Armstrong MRN: [id number] Date of Birth: 1935/04/17 Referring Provider: Margette Fast, MD  Encounter Date: 08/15/2016      PT End of Session - 08/18/16 [id number]    Visit Number 1   Number of Visits 8   Date for PT Re-Evaluation 10/02/16  greater than 30 days due to patient's scheduling limitations (will only come in afternoon)   Authorization Type G code every 10th visit   PT Start Time 1454   PT Stop Time 1535   PT Time Calculation (min) 41 min   Equipment Utilized During Treatment Gait belt   Activity Tolerance Patient tolerated treatment well   Behavior During Therapy [facility] for tasks assessed/performed      Past Medical History:  Diagnosis Date  . Anxiety   . Chronic insomnia 03/30/2015  . Depression   . GERD (gastroesophageal reflux disease)   . Hiatal hernia   . High cholesterol   . Hypertelorism   . Hypertension   . Migraine     Past Surgical History:  Procedure Laterality Date  . STOMACH SURGERY      There were no vitals filed for this visit.       Subjective Assessment - 08/18/16 0622    Subjective The patient reports she feels so weak right now, she wants to lie down.  The patient is tangential and is asking about observing her scans on our computer to see what they look like. PT redirected to complete evaluation today.   "I'm so sick I thought I couldn't even come today.  I feel like I'm dying every day.  I can't even drive my car."  Patient notes head pain x 2 years and she reports she has seen 4 neurologists and no one can tell her how to make it stop.  She wakes with pain in her head every morning.  She also c/o back pain.   "I'm supposed to use a cane, but I left it at home."   Patient Stated Goals "I dont' know if we should start with balance  therapy or what."    Currently in Pain? Yes   Pain Score 7    Pain Location Head   Pain Orientation Right   Pain Descriptors / Indicators Aching   Pain Type Chronic pain   Pain Onset More than a month ago   Pain Frequency Constant   Aggravating Factors  morning   Pain Relieving Factors taking pain meds atleast 2 times/day; also recent muscle relaxer has helped            [facility] PT Assessment - 08/18/16 0616      Assessment   Medical Diagnosis Chronic migraine, neck pain, abnormality of gait   Referring Provider Margette Fast, MD   Onset Date/Surgical Date --  2 years ago   Prior Therapy Over a year ago.     Precautions   Precautions None     Balance Screen   Has the patient fallen in the past 6 months Yes   How many times? 3-4  patient reports she lands on her knees   Has the patient had a decrease in activity level because of a fear of falling?  Yes   Is the patient reluctant to leave their home because of a fear of falling?  Yes  only leaves home  if someone with her     [place] to enter   Entrance Stairs-Number of Steps 1  at back   [place] level;Able to live on main level with bedroom/bathroom   Home Equipment None   Additional Comments worse after she eats; friends are taking her out to eat; only fixes frozen dinners or quick meals at home.     Prior Function   Level of Independence Independent  tries to avoid driving     Cognition   Overall Cognitive Status Within Functional Limits for tasks assessed     Observation/Other Assessments   Focus on Therapeutic Outcomes (FOTO)  n/a- patient did not complete     Sensation   Light Touch Impaired Detail   Light Touch Impaired Details --  tips of fingers stay numb     Posture/Postural Control   Posture/Postural Control Postural limitations   Postural Limitations Rounded  Shoulders;Forward head     ROM / Strength   AROM / PROM / Strength AROM;Strength     AROM   Overall AROM Comments Neck A/ROM rotation 62 deg right, 50 deg left; 25 deg bilateral sidebending; neck flexion WFLs.     Strength   Overall Strength Comments Patient reports she stays in bed "until I get over this weakness."  Bilateral shoulder flexion/abduction is 4-/5, elbow flexion is 5/5, hip flexion 3+/5, knee fleixon 4/5, knee extension 5/5, ankle DF 4-/5.       Palpation   Palpation comment Patient neck pain is relieved by gentle distraction, tightness noted in bilateral upper trapezius, scalenes, suboccipital tightness.      Transfers   Transfers Sit to Stand   Sit to Stand 7: Independent     Ambulation/Gait   Ambulation/Gait Yes   Ambulation/Gait Assistance 6: Modified independent (Device/Increase time)  slow pace   Ambulation Distance (Feet) 200 Feet   Assistive device None   Gait Pattern Decreased stride length  valgus knee position (L >R); slow pace   Ambulation Surface Level   Gait velocity 1.80 ft/sec   Stairs Yes   Stairs Assistance 6: Modified independent (Device/Increase time)   Stair Management Technique Two rails;Alternating pattern   Number of Stairs 4                           PT Education - 08/18/16 [phone]    Education provided Yes   Education Details home walking program.   Person(s) Educated Patient   Methods Explanation;Handout   Comprehension Verbalized understanding          PT Short Term Goals - 08/18/16 0626      PT SHORT TERM GOAL #1   Title STGs=LTGs           PT Long Term Goals - 08/18/16 [id number]      PT LONG TERM GOAL #1   Title The patient will return demo HEP with written cues for neck flexibility and stabilization, LE strengthening, balance and general mobility.   Baseline Target date 10/02/2016   Time 4   Period Weeks     PT LONG TERM GOAL #2   Title The patient will improve gait speed from 1.8 ft/sec to > or  equal to 2.2 ft/sec to demo improved mobility.   Baseline Target date 10/02/2016   Time 4   Period Weeks  PT LONG TERM GOAL #3   Title The patient will improve neck A/ROM from 50 degrees to the left up to 60 degrees to demo improving neck ROM/flexibility.   Baseline Target date 10/02/2016   Time 4   Period Weeks     PT LONG TERM GOAL #4   Title The patient will have Berg completed and improve by 6 points.   Baseline Target date 10/02/2016   Time 4   Period Weeks     PT LONG TERM GOAL #5   Title The patient will report pain at rest to be < or equal to 4/10 to demo improved management of chronic pain.   Baseline Target date 10/02/2016   Time 4   Period Weeks               Plan - 08/18/16 [id number]    Clinical Impression Statement The patient is a 81 year old female presenting to outpatient physical therapy with chronic neck pain, declining mobility, falls, and general deconditioning.  PT to address patient deficits to promote improved mobility for household and community and to optimize current functional status.    Rehab Potential Good   PT Frequency 2x / week   PT Duration 4 weeks   PT Treatment/Interventions ADLs/Self Care Home Management;Gait training;Functional mobility training;Stair training;Patient/family education;Neuromuscular re-education;Balance training;Therapeutic exercise;Therapeutic activities;Manual techniques   PT Next Visit Plan CHECK BERG and modify LTG *Add HEP: general LE strengthening (mini squats, heel/toe raises, marching) + neck (A/ROM rotation, anterior chest stretch, shoulder circles) *add as able to do; gait training for endurance and strength; balance training.   Consulted and Agree with Plan of Care Patient      Patient will benefit from skilled therapeutic intervention in order to improve the following deficits and impairments:  Abnormal gait, Decreased balance, Decreased activity tolerance, Decreased mobility, Decreased strength, Postural  dysfunction, Pain, Impaired flexibility, Decreased range of motion, Difficulty walking  Visit Diagnosis: Abnormality of gait - Plan: PT plan of care cert/re-cert  Muscle weakness (generalized) - Plan: PT plan of care cert/re-cert  Abnormal posture - Plan: PT plan of care cert/re-cert  Neck pain - Plan: PT plan of care cert/re-cert     Problem List Patient Active Problem List   Diagnosis Date Noted  . Abdominal pain 03/26/2016  . Essential (primary) hypertension 03/26/2016  . 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 Peptic esophagitis 03/26/2016  . Neuralgia neuritis, sciatic nerve 03/26/2016  . 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 Chronic insomnia 03/30/2015  . Chronic migraine 01/10/2015  . Depression 04/20/2014  . Anxiety state, unspecified 04/20/2014  . Chronic back pain 04/20/2014  . Chronic daily headache 04/20/2014  . Unspecified essential hypertension 04/20/2014  . Migraine, unspecified, without mention of intractable migraine without mention of status migrainosus 04/19/2014    Mervyn Pflaum, PT 08/18/2016, 6:35 AM  [facility] [address] [place], [place], [address] Phone: [phone]   Fax:  [phone]  Name: Tracey Armstrong MRN: [id number] Date of Birth: 21-Nov-1934

## 2016-08-19 ENCOUNTER — Other Ambulatory Visit: Payer: Self-pay

## 2016-08-19 ENCOUNTER — Telehealth: Payer: Self-pay | Admitting: *Deleted

## 2016-08-19 ENCOUNTER — Ambulatory Visit: Payer: Medicare Other | Admitting: Rehabilitative and Restorative Service Providers"

## 2016-08-19 DIAGNOSIS — M6281 Muscle weakness (generalized): Secondary | ICD-10-CM | POA: Diagnosis not present

## 2016-08-19 DIAGNOSIS — M542 Cervicalgia: Secondary | ICD-10-CM | POA: Diagnosis not present

## 2016-08-19 DIAGNOSIS — R293 Abnormal posture: Secondary | ICD-10-CM

## 2016-08-19 DIAGNOSIS — R269 Unspecified abnormalities of gait and mobility: Secondary | ICD-10-CM | POA: Diagnosis not present

## 2016-08-19 MED ORDER — TIZANIDINE HCL 2 MG PO TABS: 2.0000 mg | ORAL_TABLET | Freq: Every day | ORAL | 0 refills | Status: DC

## 2016-08-19 MED ORDER — TIZANIDINE HCL 2 MG PO TABS: 4.0000 mg | ORAL_TABLET | Freq: Every day | ORAL | Status: DC

## 2016-08-19 NOTE — Telephone Encounter (Signed)
I called the patient. Okay to increase tizanidine to 4 mg at nighttime. We will continue this dose if she is tolerating this well.

## 2016-08-19 NOTE — Telephone Encounter (Signed)
90 day refill e-scribed per faxed request from pharmacy.

## 2016-08-19 NOTE — Telephone Encounter (Signed)
90 day rx was sent in to [facility] pharmacy earlier today.

## 2016-08-19 NOTE — Addendum Note (Signed)
Addended by: Monte Fantasia on: 08/19/2016 09:46 AM   Modules accepted: Orders

## 2016-08-19 NOTE — Therapy (Signed)
Tracey Armstrong [address] [place], [place], [address] Phone: [phone]   Fax:  [phone]  Physical Therapy Treatment  Patient Details  Name: Tracey Armstrong MRN: [id number] Date of Birth: [birth year] Referring Provider: Margette Fast, MD  Encounter Date: 08/19/2016      PT End of Session - 08/19/16 1158    Visit Number 2   Number of Visits 8   Date for PT Re-Evaluation 10/02/16  greater than 30 days due to patient's scheduling limitations (will only come in afternoon)   Authorization Type G code every 10th visit   PT Start Time 1150   PT Stop Time 1230   PT Time Calculation (min) 40 min   Equipment Utilized During Treatment Gait belt   Activity Tolerance Patient tolerated treatment well   Behavior During Therapy [facility] for tasks assessed/performed      Past Medical History:  Diagnosis Date  . Anxiety   . Chronic insomnia 03/30/2015  . Depression   . GERD (gastroesophageal reflux disease)   . Hiatal hernia   . High cholesterol   . Hypertelorism   . Hypertension   . Migraine     Past Surgical History:  Procedure Laterality Date  . STOMACH SURGERY      There were no vitals filed for this visit.      Subjective Assessment - 08/19/16 1150    Subjective The patient has been walking more in the house.  She reports she is having a good day today.   The patient reports she is having less migraines reporting "I'm sleeping better."  She notes she is not having HA every day.   Patient Stated Goals "I Tracey Armstrong' know if we should start with balance therapy or what."    Currently in Pain? No/denies      NEUROMUSCULAR RE-EDUCATION: Berg=41/56     [facility] PT Assessment - 08/19/16 1152      Standardized Balance Assessment   Standardized Balance Assessment Berg Balance Test     Berg Balance Test   Sit to Stand Able to stand without using hands and stabilize independently   Standing Unsupported Able to stand safely  2 minutes   Sitting with Back Unsupported but Feet Supported on Floor or Stool Able to sit safely and securely 2 minutes   Stand to Sit Sits safely with minimal use of hands   Transfers Able to transfer safely, definite need of hands   Standing Unsupported with Eyes Closed Able to stand 10 seconds safely   Standing Ubsupported with Feet Together Able to place feet together independently and stand 1 minute safely   From Standing, Reach Forward with Outstretched Arm Can reach forward >5 cm safely (2")   From Standing Position, Pick up Object from Floor Able to pick up shoe safely and easily   From Standing Position, Turn to Look Behind Over each Shoulder Needs supervision when turning   Turn 360 Degrees Able to turn 360 degrees safely but slowly   Standing Unsupported, Alternately Place Feet on Step/Stool Able to complete 4 steps without aid or supervision   Standing Unsupported, One Foot in Front Able to take small step independently and hold 30 seconds   Standing on One Leg Tries to lift leg/unable to hold 3 seconds but remains standing independently   Total Score 41   Berg comment: 41/56      THERAPEUTIC EXERCISE: Supine chin tucks x 5 reps with demo and tactile cues on position Seated shoulder rolls  posteriorly x 10 reps Seated neck rotation and sidebending x 5 reps each Seated scapular retraction with emphasis on resting position upright through trunk and adding yellow theraband Supine self thoracic mobilization with anterior chest stretching   MANUAL: Supine manual soft tissue mobilization R and L upper traps, suboccipital release, passive overpressure with muscle lengthening, and soft tissue mobilization of scalenes        PT Education - 08/19/16 1226    Education provided Yes   Education Details HEP: chin tucks, towel roll stretch, shoulder circles, scapular retraction   Person(s) Educated Patient   Methods Explanation;Demonstration;Handout   Comprehension Verbalized  understanding;Returned demonstration          PT Short Term Goals - 08/18/16 0626      PT SHORT TERM GOAL #1   Title STGs=LTGs           PT Long Term Goals - 08/19/16 1159      PT LONG TERM GOAL #1   Title The patient will return demo HEP with written cues for neck flexibility and stabilization, LE strengthening, balance and general mobility.   Baseline Target date 10/02/2016   Time 4   Period Weeks     PT LONG TERM GOAL #2   Title The patient will improve gait speed from 1.8 ft/sec to > or equal to 2.2 ft/sec to demo improved mobility.   Baseline Target date 10/02/2016   Time 4   Period Weeks     PT LONG TERM GOAL #3   Title The patient will improve neck A/ROM from 50 degrees to the left up to 60 degrees to demo improving neck ROM/flexibility.   Baseline Target date 10/02/2016   Time 4   Period Weeks     PT LONG TERM GOAL #4   Title The patient will have Berg completed and improve by 6 points.   Baseline Target date 10/02/2016  *baseline 41/56 completed 08/19/2016   Time 4   Period Weeks     PT LONG TERM GOAL #5   Title The patient will report pain at rest to be < or equal to 4/10 to demo improved management of chronic pain.   Baseline Target date 10/02/2016   Time 4   Period Weeks               Plan - 08/19/16 1708    Clinical Impression Statement The patient reported less headache today compared to last visit.  She felt fatigued today with minimal exercise.  PT to gradually add further LE/balance activities to HEP as patient tolerance to activity improves.  Also plan to continue to progress endurance and posture activities.   PT Treatment/Interventions ADLs/Self Care Home Management;Gait training;Functional mobility training;Stair training;Patient/family education;Neuromuscular re-education;Balance training;Therapeutic exercise;Therapeutic activities;Manual techniques   PT Next Visit Plan Check HEP for posture/neck, add LE strengthening as tolerated  (consider mini squats, heel/toe raises, marching); gait training, balance training.   Consulted and Agree with Plan of Care Patient      Patient will benefit from skilled therapeutic intervention in order to improve the following deficits and impairments:  Abnormal gait, Decreased balance, Decreased activity tolerance, Decreased mobility, Decreased strength, Postural dysfunction, Pain, Impaired flexibility, Decreased range of motion, Difficulty walking  Visit Diagnosis: Abnormality of gait  Muscle weakness (generalized)  Abnormal posture  Neck pain     Problem List Patient Active Problem List   Diagnosis Date Noted  . Abdominal pain 03/26/2016  . Essential (primary) hypertension 03/26/2016  . 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  Peptic esophagitis 03/26/2016  . Neuralgia neuritis, sciatic nerve 03/26/2016  . 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 Chronic insomnia 03/30/2015  . Chronic migraine 01/10/2015  . Depression 04/20/2014  . Anxiety state, unspecified 04/20/2014  . Chronic back pain 04/20/2014  . Chronic daily headache 04/20/2014  . Unspecified essential hypertension 04/20/2014  . Migraine, unspecified, without mention of intractable migraine without mention of status migrainosus 04/19/2014    Marcin Holte 08/19/2016, 5:09 PM  [facility] [address] [place], [place], [address] Phone: [phone]   Fax:  [phone]  Name: Tracey Armstrong MRN: [id number] Date of Birth: 03-28-1935

## 2016-08-19 NOTE — Patient Instructions (Signed)
Thoracic Self-Mobilization (Supine)    With rolled towel placed lengthwise at lower ribs level, lie back on towel with arms outstretched. Hold _2 minutes. Relax. Repeat __1-2__ times per day as needed for postural stretching.  http://orth.exer.us/1001   Copyright  VHI. All rights reserved.   Healthy Back - Shoulder Roll    Stand straight with arms relaxed at sides. Roll shoulders backward continuously. Do __10__ times.  Can repeat throughout the day, as tolerated.  Copyright  VHI. All rights reserved.   Extensors, Supine    Lie supine, head on small, rolled towel or one pillow.  Gently tuck chin and bring toward chest. Hold __5_ seconds. Repeat __10_ times per session. Do _2__ sessions per day.  Copyright  VHI. All rights reserved.  Resisted Horizontal Abduction: Bilateral    SIT TALL BEFORE YOU BEGIN. Sit or stand, tubing in both hands, arms out in front. Keeping arms straight, pinch shoulder blades together and stretch arms out. Repeat __5__ times per set. Do __2__ sets per session. Do __2__ sessions per day.  http://orth.exer.us/969   Copyright  VHI. All rights reserved.

## 2016-08-21 ENCOUNTER — Ambulatory Visit: Payer: Medicare Other | Admitting: Rehabilitation

## 2016-08-25 ENCOUNTER — Ambulatory Visit: Payer: Medicare Other | Attending: Neurology | Admitting: *Deleted

## 2016-08-25 DIAGNOSIS — R269 Unspecified abnormalities of gait and mobility: Secondary | ICD-10-CM | POA: Diagnosis not present

## 2016-08-25 DIAGNOSIS — M542 Cervicalgia: Secondary | ICD-10-CM | POA: Diagnosis not present

## 2016-08-25 DIAGNOSIS — M6281 Muscle weakness (generalized): Secondary | ICD-10-CM | POA: Insufficient documentation

## 2016-08-25 DIAGNOSIS — R293 Abnormal posture: Secondary | ICD-10-CM | POA: Diagnosis not present

## 2016-08-25 NOTE — Therapy (Signed)
[facility] [address] [place], [place], [address] Phone: [phone]   Fax:  [phone]  Physical Therapy Treatment  Patient Details  Name: Tracey Armstrong MRN: [id number] Date of Birth: [birth year] Referring Provider: Margette Fast, MD  Encounter Date: 08/25/2016      PT End of Session - 08/25/16 1625    Visit Number 3   Number of Visits 8   Date for PT Re-Evaluation 10/02/16   Authorization Type G code every 10th visit   PT Start Time 1315   PT Stop Time 1400   PT Time Calculation (min) 45 min   Activity Tolerance Patient tolerated treatment well   Behavior During Therapy [facility] for tasks assessed/performed      Past Medical History:  Diagnosis Date  . Anxiety   . Chronic insomnia 03/30/2015  . Depression   . GERD (gastroesophageal reflux disease)   . Hiatal hernia   . High cholesterol   . Hypertelorism   . Hypertension   . Migraine     Past Surgical History:  Procedure Laterality Date  . STOMACH SURGERY      There were no vitals filed for this visit.      Subjective Assessment - 08/25/16 1316    Subjective Had miraine Thursday until Sunday and wasn't sure I could come today but got someone to bring me since I cannot drive when I take medications.    Patient is accompained by: --  brought by a girl pt hired to help 2-3 days a week, to drive, straighten house and help with mail   Pain Score 2    Pain Location Neck   Pain Orientation Right;Lower   Pain Descriptors / Indicators Aching   Pain Type Chronic pain   Pain Onset More than a month ago   Aggravating Factors  unsure   Pain Relieving Factors medication      Patient performed supine shoulder horizontal abduction with yellow t-band with towel roll at spine for postural correction; chin tucks x 10 w/ 5 sec hold; pelvic tilts x 5 w/ 5 sec hold, then tilts with march 5, and tilts with bridge x 5, seated cervical upper trap stretch x 2 w/ 20  sec hold.  Sit to stand x 5 without UE support from chair height, stand step taps forward x 10 min guard A (LOB with returning to sit, min A for safety to sit edge of mat.)  Patient ambulated with no device 110' close S, then with [facility] with min A x 110' and S 110' cues for sequencing.  Encouraged to consider using cane as improved balance/safety, decreased fall risk. Patient also encouraged to practice HEP this week if pain better controlled.                            PT Education - 08/25/16 1625    Education provided Yes   Education Details HEP to continue   Person(s) Educated Patient   Methods Explanation   Comprehension Verbalized understanding          PT Short Term Goals - 08/18/16 0626      PT SHORT TERM GOAL #1   Title STGs=LTGs           PT Long Term Goals - 08/19/16 1159      PT LONG TERM GOAL #1   Title The patient will return demo HEP with written cues for neck flexibility and stabilization,  LE strengthening, balance and general mobility.   Baseline Target date 10/02/2016   Time 4   Period Weeks     PT LONG TERM GOAL #2   Title The patient will improve gait speed from 1.8 ft/sec to > or equal to 2.2 ft/sec to demo improved mobility.   Baseline Target date 10/02/2016   Time 4   Period Weeks     PT LONG TERM GOAL #3   Title The patient will improve neck A/ROM from 50 degrees to the left up to 60 degrees to demo improving neck ROM/flexibility.   Baseline Target date 10/02/2016   Time 4   Period Weeks     PT LONG TERM GOAL #4   Title The patient will have Berg completed and improve by 6 points.   Baseline Target date 10/02/2016  *baseline 41/56 completed 08/19/2016   Time 4   Period Weeks     PT LONG TERM GOAL #5   Title The patient will report pain at rest to be < or equal to 4/10 to demo improved management of chronic pain.   Baseline Target date 10/02/2016   Time 4   Period Weeks               Plan - 08/25/16 1626     Clinical Impression Statement Patient able to tolerate treatment this session without increased pain.  Was unable to complete HEP since last session due to headache lasting Thursday through Sunday.  Feel she worked well on endurance activities today with more walking esp with cane with improved safety, but pt refuses to use.  Feel she will benefit from continued skilled PT to progress to goals.    PT Treatment/Interventions ADLs/Self Care Home Management;Gait training;Functional mobility training;Stair training;Patient/family education;Neuromuscular re-education;Balance training;Therapeutic exercise;Therapeutic activities;Manual techniques   PT Next Visit Plan Check HEP again after time to practice, continue with sit to stands and gait with cane.   Consulted and Agree with Plan of Care Patient      Patient will benefit from skilled therapeutic intervention in order to improve the following deficits and impairments:  Abnormal gait, Decreased balance, Decreased activity tolerance, Decreased mobility, Decreased strength, Postural dysfunction, Pain, Impaired flexibility, Decreased range of motion, Difficulty walking  Visit Diagnosis: Abnormality of gait  Muscle weakness (generalized)  Abnormal posture  Neck pain     Problem List Patient Active Problem List   Diagnosis Date Noted  . Abdominal pain 03/26/2016  . Essential (primary) hypertension 03/26/2016  . 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 Peptic esophagitis 03/26/2016  . Neuralgia neuritis, sciatic nerve 03/26/2016  . 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[place]) 06/20/2015  . Chronic insomnia 03/30/2015  . Chronic migraine 01/10/2015  . Depression 04/20/2014  . Anxiety state, unspecified 04/20/2014  . Chronic back pain 04/20/2014  . Chronic daily headache 04/20/2014  . Unspecified essential hypertension 04/20/2014  . Migraine, unspecified, without mention of intractable migraine without mention of status migrainosus 04/19/2014    Reginia Naas 08/25/2016, 4:30 PM  [facility] [address] [place], [place], [address] Phone: [phone]   Fax:  [phone]  Name: Tracey Armstrong MRN: [id number] Date of Birth: [birth year]

## 2016-08-25 NOTE — Patient Instructions (Signed)
Educated to continue to work on current HEP if improved this week with less migraine symptoms.

## 2016-08-28 ENCOUNTER — Ambulatory Visit: Payer: Medicare Other | Admitting: Physical Therapy

## 2016-08-28 DIAGNOSIS — R293 Abnormal posture: Secondary | ICD-10-CM | POA: Diagnosis not present

## 2016-08-28 DIAGNOSIS — M6281 Muscle weakness (generalized): Secondary | ICD-10-CM

## 2016-08-28 DIAGNOSIS — M542 Cervicalgia: Secondary | ICD-10-CM | POA: Diagnosis not present

## 2016-08-28 DIAGNOSIS — R269 Unspecified abnormalities of gait and mobility: Secondary | ICD-10-CM

## 2016-08-28 NOTE — Therapy (Signed)
[facility] [address] [place], [place], [address] Phone: [phone]   Fax:  [phone]  Physical Therapy Treatment  Patient Details  Name: Tracey Armstrong MRN: [id number] Date of Birth: Feb 22, 1935 Referring Provider: Margette Fast, MD  Encounter Date: 08/28/2016      PT End of Session - 08/28/16 1402    Visit Number 4   Number of Visits 8   Date for PT Re-Evaluation 10/02/16   Authorization Type G code every 10th visit   PT Start Time 1316   PT Stop Time 1403   PT Time Calculation (min) 47 min   Activity Tolerance Patient tolerated treatment well   Behavior During Therapy [facility] for tasks assessed/performed      Past Medical History:  Diagnosis Date  . Anxiety   . Chronic insomnia 03/30/2015  . Depression   . GERD (gastroesophageal reflux disease)   . Hiatal hernia   . High cholesterol   . Hypertelorism   . Hypertension   . Migraine     Past Surgical History:  Procedure Laterality Date  . STOMACH SURGERY      There were no vitals filed for this visit.      Subjective Assessment - 08/28/16 1319    Subjective Reports not doing exercises due to headache everyday.  Wakes up with a headache every day.     Patient Stated Goals "I [name]' know if we should start with balance therapy or what."    Currently in Pain? No/denies  was 7/10 this morning (headache in top of head)             [facility] Adult PT Treatment/Exercise - 08/28/16 0001      Transfers   Transfers Sit to Stand;Stand to Sit   Stand to Sit 5: Supervision   Number of Reps 10 reps   Comments cues for forward weight shift and to scoot to edge of mat     Posture/Postural Control   Posture/Postural Control Postural limitations   Postural Limitations Rounded Shoulders;Forward head     Exercises   Exercises Lumbar;Knee/Hip;Shoulder;Neck     Neck Exercises: Standing   Other Standing Exercises shoulder shrugs/circles anterior x 10 then  posterior x 10 in standing     Neck Exercises: Seated   Cervical Rotation Both;5 reps   Lateral Flexion Both;5 reps     Neck Exercises: Supine   Cervical Isometrics Extension;5 secs;10 reps     Lumbar Exercises: Supine   Bridge Non-compliant;10 reps;3 seconds   Other Supine Lumbar Exercises hip adduction with pillow between knees and 5 sec hold x 10 reps     Knee/Hip Exercises: Standing   Other Standing Knee Exercises taps to 6" step with 2 UE then 1 UE assist alternating LE x 10 reps x 2 sets     Knee/Hip Exercises: Supine   Other Supine Knee/Hip Exercises supine hip abduction x 5 reps then x 10 with yellow theraband-in hooklying position;repeated isoloating single leg abduction x 10   Other Supine Knee/Hip Exercises bil feet on red therapy ball for hip/knee flexion<>extension x 10 reps                  PT Short Term Goals - 08/18/16 0626      PT SHORT TERM GOAL #1   Title STGs=LTGs           PT Long Term Goals - 08/19/16 1159      PT LONG TERM GOAL #1  Title The patient will return demo HEP with written cues for neck flexibility and stabilization, LE strengthening, balance and general mobility.   Baseline Target date 10/02/2016   Time 4   Period Weeks     PT LONG TERM GOAL #2   Title The patient will improve gait speed from 1.8 ft/sec to > or equal to 2.2 ft/sec to demo improved mobility.   Baseline Target date 10/02/2016   Time 4   Period Weeks     PT LONG TERM GOAL #3   Title The patient will improve neck A/ROM from 50 degrees to the left up to 60 degrees to demo improving neck ROM/flexibility.   Baseline Target date 10/02/2016   Time 4   Period Weeks     PT LONG TERM GOAL #4   Title The patient will have Berg completed and improve by 6 points.   Baseline Target date 10/02/2016  *baseline 41/56 completed 08/19/2016   Time 4   Period Weeks     PT LONG TERM GOAL #5   Title The patient will report pain at rest to be < or equal to 4/10 to demo  improved management of chronic pain.   Baseline Target date 10/02/2016   Time 4   Period Weeks               Plan - 08/28/16 1402    Clinical Impression Statement Pt tolerated increased exercises during session today.  Not doing much activity at home due to pain/headache.  Continue PT per POC.   PT Treatment/Interventions ADLs/Self Care Home Management;Gait training;Functional mobility training;Stair training;Patient/family education;Neuromuscular re-education;Balance training;Therapeutic exercise;Therapeutic activities;Manual techniques   PT Next Visit Plan Ask pt if she has been performing HEP, gait for endurance/walking program, strengthening   Consulted and Agree with Plan of Care Patient      Patient will benefit from skilled therapeutic intervention in order to improve the following deficits and impairments:  Abnormal gait, Decreased balance, Decreased activity tolerance, Decreased mobility, Decreased strength, Postural dysfunction, Pain, Impaired flexibility, Decreased range of motion, Difficulty walking  Visit Diagnosis: Abnormality of gait  Muscle weakness (generalized)  Abnormal posture     Problem List Patient Active Problem List   Diagnosis Date Noted  . Abdominal pain 03/26/2016  . Essential (primary) hypertension 03/26/2016  . 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 Peptic esophagitis 03/26/2016  . Neuralgia neuritis, sciatic nerve 03/26/2016  . 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 Chronic insomnia 03/30/2015  . Chronic migraine 01/10/2015  . Depression 04/20/2014  . Anxiety state, unspecified 04/20/2014  . Chronic back pain 04/20/2014  . Chronic daily headache 04/20/2014  . Unspecified essential hypertension 04/20/2014  . Migraine, unspecified, without mention of intractable migraine without mention of status migrainosus 04/19/2014    [name] 08/28/2016, 2:06 PM  [facility] [address] [place], [place], [address] Phone: [phone]   Fax:  [phone]  Name: Tracey Armstrong MRN: [id number] Date of Birth: 1935/04/20  [name], [place] 08/28/16 2:06 PM Phone: [phone] Fax: [phone]

## 2016-09-02 ENCOUNTER — Ambulatory Visit: Payer: Medicare Other | Admitting: Physical Therapy

## 2016-09-03 ENCOUNTER — Ambulatory Visit: Payer: Medicare Other | Admitting: Physical Therapy

## 2016-09-03 ENCOUNTER — Ambulatory Visit: Payer: Medicare Other | Admitting: Rehabilitative and Restorative Service Providers"

## 2016-09-18 ENCOUNTER — Ambulatory Visit: Payer: Medicare Other | Admitting: Rehabilitative and Restorative Service Providers"

## 2016-09-18 DIAGNOSIS — H2513 Age-related nuclear cataract, bilateral: Secondary | ICD-10-CM | POA: Diagnosis not present

## 2016-09-18 DIAGNOSIS — R293 Abnormal posture: Secondary | ICD-10-CM | POA: Diagnosis not present

## 2016-09-18 DIAGNOSIS — M542 Cervicalgia: Secondary | ICD-10-CM | POA: Diagnosis not present

## 2016-09-18 DIAGNOSIS — M6281 Muscle weakness (generalized): Secondary | ICD-10-CM | POA: Diagnosis not present

## 2016-09-18 DIAGNOSIS — R269 Unspecified abnormalities of gait and mobility: Secondary | ICD-10-CM | POA: Diagnosis not present

## 2016-09-18 NOTE — Therapy (Signed)
[facility] [address] [place], [place], [address] Phone: [phone]   Fax:  [phone]  Physical Therapy Treatment  Patient Details  Name: Tracey Armstrong MRN: [id number] Date of Birth: 30-Mar-1935 Referring Provider: Margette Fast, MD  Encounter Date: 09/18/2016      PT End of Session - 09/18/16 1337    Visit Number 5   Number of Visits 8   Date for PT Re-Evaluation 10/02/16   Authorization Type G code every 10th visit   PT Start Time 1320   PT Stop Time 1410   PT Time Calculation (min) 50 min   Activity Tolerance Patient tolerated treatment well   Behavior During Therapy [facility] for tasks assessed/performed      Past Medical History:  Diagnosis Date  . Anxiety   . Chronic insomnia 03/30/2015  . Depression   . GERD (gastroesophageal reflux disease)   . Hiatal hernia   . High cholesterol   . Hypertelorism   . Hypertension   . Migraine     Past Surgical History:  Procedure Laterality Date  . STOMACH SURGERY      There were no vitals filed for this visit.      Subjective Assessment - 09/18/16 1321    Subjective The patient reports she had a bad headache this morning.  She feels that it is improving after taking a pain pill today.  She notes overall her balance has improved since beginning therapy.  The patient notes she traveled to [place] x past 2 weeks.   Patient Stated Goals "I [name]' know if we should start with balance therapy or what."    Currently in Pain? Yes   Pain Score 4   down from earlier "it was bad to start with"   Pain Location Head   Pain Orientation Right   Pain Type Chronic pain   Pain Onset More than a month ago   Pain Frequency Constant   Aggravating Factors  unsure   Pain Relieving Factors medication            [facility] PT Assessment - 09/18/16 1341      Ambulation/Gait   Ambulation/Gait Yes   Ambulation/Gait Assistance 7: Independent   Ambulation Distance (Feet) 200  Feet   Gait velocity 2.51 ft/sec     Standardized Balance Assessment   Standardized Balance Assessment Berg Balance Test     Berg Balance Test   Sit to Stand Able to stand without using hands and stabilize independently   Standing Unsupported Able to stand safely 2 minutes   Sitting with Back Unsupported but Feet Supported on Floor or Stool Able to sit safely and securely 2 minutes   Stand to Sit Sits safely with minimal use of hands   Transfers Able to transfer safely, minor use of hands   Standing Unsupported with Eyes Closed Able to stand 10 seconds safely   Standing Ubsupported with Feet Together Able to place feet together independently and stand 1 minute safely   From Standing, Reach Forward with Outstretched Arm Can reach forward >12 cm safely (5")   From Standing Position, Pick up Object from Floor Able to pick up shoe safely and easily   From Standing Position, Turn to Look Behind Over each Shoulder Looks behind from both sides and weight shifts well   Turn 360 Degrees Able to turn 360 degrees safely in 4 seconds or less   Standing Unsupported, Alternately Place Feet on Step/Stool Able to stand independently and  safely and complete 8 steps in 20 seconds   Standing Unsupported, One Foot in Front Able to plae foot ahead of the other independently and hold 30 seconds   Standing on One Leg Tries to lift leg/unable to hold 3 seconds but remains standing independently   Total Score 51   Berg comment: 51/56 improved from 41/56 today.    THERAPEUTIC EXERCISE: Reviewed all 4 HEP with patient.  She is able to perform correctly, but not doing due to headache.  SELF CARE/HOME MANAGEMENT: Discussed recommendations for ongoing participation in HEP recommending silver sneakers or AHOY classes at recreation center Discussed plan and provided patient with resources and benefits of exercise.        PT Education - 09/18/16 1417    Education provided Yes   Education Details provided contact  information for silver sneakers and    Person(s) Educated Patient   Methods Explanation;Demonstration;Handout   Comprehension Verbalized understanding;Returned demonstration            PT Short Term Goals - 08/18/16 0626      PT SHORT TERM GOAL #1   Title STGs=LTGs           PT Long Term Goals - 09/18/16 1337      PT LONG TERM GOAL #1   Title The patient will return demo HEP with written cues for neck flexibility and stabilization, LE strengthening, balance and general mobility.   Baseline 09/18/2016- has HEP for postural stabilization and flexibility.  She is not performing at home, therefore further HEP not added.    Time 4   Period Weeks   Status Partially Met     PT LONG TERM GOAL #2   Title The patient will improve gait speed from 1.8 ft/sec to > or equal to 2.2 ft/sec to demo improved mobility.   Baseline Improved to 2.51 ft/sec   Time 4   Period Weeks   Status Achieved     PT LONG TERM GOAL #3   Title The patient will improve neck A/ROM from 50 degrees to the left up to 60 degrees to demo improving neck ROM/flexibility.   Baseline L rotation improved to 62 degrees.   Time 4   Period Weeks   Status Achieved     PT LONG TERM GOAL #4   Title The patient will have Berg completed and improve by 6 points.   Baseline Improved from 41/56 up to 51/56.     Time 4   Period Weeks   Status Achieved     PT LONG TERM GOAL #5   Title The patient will report pain at rest to be < or equal to 4/10 to demo improved management of chronic pain.   Baseline Met on 09/18/2016   Time 4   Period Weeks   Status Achieved               Plan - 09/18/16 1419    Clinical Impression Statement The patient has met all LTGs except for carryover of HEP to home due to not feeling up to exercising due to daily headaches.  PT and patient discussed options out of the home for greater compliance.  She thinks she is a member of silver sneakers-- PT wrote out list of places to contact  for regular exercise.  PT to see for 1 more visit on 12/8 to see if she has signed up and check HEP, which patient is going to attempt to perform between now and next session.  PT Treatment/Interventions ADLs/Self Care Home Management;Gait training;Functional mobility training;Stair training;Patient/family education;Neuromuscular re-education;Balance training;Therapeutic exercise;Therapeutic activities;Manual techniques   PT Next Visit Plan Check HEP compliance, f/u for community exercise, d/c.   Consulted and Agree with Plan of Care Patient      Patient will benefit from skilled therapeutic intervention in order to improve the following deficits and impairments:  Abnormal gait, Decreased balance, Decreased activity tolerance, Decreased mobility, Decreased strength, Postural dysfunction, Pain, Impaired flexibility, Decreased range of motion, Difficulty walking  Visit Diagnosis: Abnormal posture  Muscle weakness (generalized)  Neck pain     Problem List Patient Active Problem List   Diagnosis Date Noted  . Abdominal pain 03/26/2016  . Essential (primary) hypertension 03/26/2016  . 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 Peptic esophagitis 03/26/2016  . Neuralgia neuritis, sciatic nerve 03/26/2016  . 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 Chronic insomnia 03/30/2015  . Chronic migraine 01/10/2015  . Depression 04/20/2014  . Anxiety state, unspecified 04/20/2014  . Chronic back pain 04/20/2014  . Chronic daily headache 04/20/2014  . Unspecified essential hypertension 04/20/2014  . Migraine, unspecified, without mention of intractable migraine without mention of status migrainosus 04/19/2014    Angle Dirusso, PT 09/18/2016, 2:29 PM  [facility] [address] [place], [place], [address] Phone: [phone]   Fax:  [phone]  Name: Tracey Armstrong MRN: [id number] Date of Birth: [birth year]

## 2016-09-18 NOTE — Patient Instructions (Signed)
OPTIONS FOR COMMUNITY EXERCISE:  [facility] [address] ([phone]) [phone]   SILVER SNEAKERS:  [employer] [address] [phone]  [facility] [address] [phone]  [place]  [address] [phone]

## 2016-09-19 ENCOUNTER — Ambulatory Visit: Payer: Medicare Other | Admitting: Rehabilitative and Restorative Service Providers"

## 2016-09-24 ENCOUNTER — Ambulatory Visit: Payer: Medicare Other | Admitting: Rehabilitative and Restorative Service Providers"

## 2016-09-26 ENCOUNTER — Ambulatory Visit: Payer: Medicare Other | Admitting: Rehabilitative and Restorative Service Providers"

## 2016-10-02 ENCOUNTER — Ambulatory Visit: Payer: Medicare Other | Attending: Neurology | Admitting: Physical Therapy

## 2016-10-02 DIAGNOSIS — M542 Cervicalgia: Secondary | ICD-10-CM | POA: Diagnosis not present

## 2016-10-02 DIAGNOSIS — R293 Abnormal posture: Secondary | ICD-10-CM | POA: Diagnosis not present

## 2016-10-02 DIAGNOSIS — R269 Unspecified abnormalities of gait and mobility: Secondary | ICD-10-CM | POA: Diagnosis not present

## 2016-10-02 DIAGNOSIS — M6281 Muscle weakness (generalized): Secondary | ICD-10-CM | POA: Insufficient documentation

## 2016-10-02 NOTE — Therapy (Signed)
[facility] [address] [place], [place], [address] Phone: [phone]   Fax:  [phone]  Physical Therapy Treatment  Patient Details  Name: Tracey Armstrong MRN: [id number] Date of Birth: 27-Oct-1934 Referring Provider: Margette Fast, MD  Encounter Date: 10/02/2016      PT End of Session - 10/02/16 1435    Visit Number 6   Authorization Type G code every 10th visit   PT Start Time 1400  d/c visit   PT Stop Time 1435   PT Time Calculation (min) 35 min   Activity Tolerance Patient tolerated treatment well   Behavior During Therapy [facility] for tasks assessed/performed      Past Medical History:  Diagnosis Date  . Anxiety   . Chronic insomnia 03/30/2015  . Depression   . GERD (gastroesophageal reflux disease)   . Hiatal hernia   . High cholesterol   . Hypertelorism   . Hypertension   . Migraine     Past Surgical History:  Procedure Laterality Date  . STOMACH SURGERY      There were no vitals filed for this visit.      Subjective Assessment - 10/02/16 1402    Subjective hasn't been able to do anything since last session; "I took a turn for the worst."   Patient Stated Goals "I dont' know if we should start with balance therapy or what."    Currently in Pain? Yes   Pain Score 6    Pain Location Back   Pain Orientation Lower   Pain Descriptors / Indicators Aching   Pain Type Chronic pain   Pain Onset More than a month ago   Pain Frequency Constant   Aggravating Factors  unsure   Pain Relieving Factors medication                         [facility] Adult PT Treatment/Exercise - 10/02/16 1413      Ambulation/Gait   Ambulation/Gait Yes   Ambulation/Gait Assistance 7: Independent   Ambulation Distance (Feet) 300 Feet   Gait velocity 1.90 ft/sec  17.25 sec     Neck Exercises: Seated   Shoulder Rolls Backwards;15 reps   Shoulder ABduction 10 reps;Both   Shoulder Abduction Limitations  horizontal abduction with red theraband     Neck Exercises: Supine   Neck Retraction 15 reps;5 secs   Shoulder ABduction Both;10 reps   Shoulder Abduction Limitations horizontal abduction with red theraband   Other Supine Exercise bil external rotation with red theraband x 10   Other Supine Exercise attempted towel decompression but pt unable to tolerate     Knee/Hip Exercises: Aerobic   Nustep SciFit L2 x 5 min                  PT Short Term Goals - 08/18/16 0626      PT SHORT TERM GOAL #1   Title STGs=LTGs           PT Long Term Goals - 10/02/16 1436      PT LONG TERM GOAL #1   Title The patient will return demo HEP with written cues for neck flexibility and stabilization, LE strengthening, balance and general mobility.   Baseline 09/18/2016- has HEP for postural stabilization and flexibility.  She is not performing at home, therefore further HEP not added.    Status Not Met     PT LONG TERM GOAL #2   Title The patient will  improve gait speed from 1.8 ft/sec to > or equal to 2.2 ft/sec to demo improved mobility.   Status Achieved     PT LONG TERM GOAL #3   Title The patient will improve neck A/ROM from 50 degrees to the left up to 60 degrees to demo improving neck ROM/flexibility.   Status Achieved     PT LONG TERM GOAL #4   Title The patient will have Berg completed and improve by 6 points.   Status Achieved     PT LONG TERM GOAL #5   Title The patient will report pain at rest to be < or equal to 4/10 to demo improved management of chronic pain.   Status Achieved               Plan - Oct 16, 2016 1436    Clinical Impression Statement Pt returns to PT after 2 week break from PT.  During break pt was instructed to perform HEP and initiate community fitness, which pt reports she hasn't done either.  At this time feel continued PT isn't warranted as pt is unable to actively participate with consistent attendance as well as inability to perform HEP or any  community exercise.  If pt were to return to PT would need pt to be more compliant with all of these in order for PT to be indicated.   PT Next Visit Plan d/c PT today      Patient will benefit from skilled therapeutic intervention in order to improve the following deficits and impairments:  Abnormal gait, Decreased balance, Decreased activity tolerance, Decreased mobility, Decreased strength, Postural dysfunction, Pain, Impaired flexibility, Decreased range of motion, Difficulty walking  Visit Diagnosis: Abnormal posture  Muscle weakness (generalized)  Neck pain  Abnormality of gait       G-Codes - Oct 16, 2016 1440    Functional Assessment Tool Used chronic neck pain, gait speed=1.8 ft/sec, 7/10 pain.   Functional Limitation Mobility: Walking and moving around   Mobility: Walking and Moving Around Goal Status [id number]) At least 20 percent but less than 40 percent impaired, limited or restricted   Mobility: Walking and Moving Around Discharge Status [phone]) At least 20 percent but less than 40 percent impaired, limited or restricted      Problem List Patient Active Problem List   Diagnosis Date Noted  . Abdominal pain 03/26/2016  . Essential (primary) hypertension 03/26/2016  . 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 Peptic esophagitis 03/26/2016  . Neuralgia neuritis, sciatic nerve 03/26/2016  . 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[place]) 06/20/2015  . Chronic insomnia 03/30/2015  . Chronic migraine 01/10/2015  . Depression 04/20/2014  . Anxiety state, unspecified 04/20/2014  . Chronic back pain 04/20/2014  . Chronic daily headache 04/20/2014  . Unspecified essential hypertension 04/20/2014  . Migraine, unspecified, without mention of intractable migraine without mention of status migrainosus 04/19/2014       Laureen Abrahams, PT, DPT October 16, 2016 2:41 PM    [facility] [address] [place], [place], [address] Phone:  [phone]   Fax:  [phone]  Name: Tracey Armstrong MRN: [id number] Date of Birth: 10/16/1935

## 2016-10-03 ENCOUNTER — Encounter: Payer: Self-pay | Admitting: Rehabilitative and Restorative Service Providers"

## 2016-10-03 NOTE — Therapy (Signed)
[facility] [address] [place], [place], [address] Phone: [phone]   Fax:  [phone]  Patient Details  Name: Tracey Armstrong MRN: [id number] Date of Birth: 13-Jun-1935 Referring Provider:  Lenor Coffin, MD  Encounter Date: 10/02/2016  PHYSICAL THERAPY DISCHARGE SUMMARY  Visits from Start of Care: 6   Current functional level related to goals / functional outcomes:     PT Short Term Goals - 08/18/16 0626      PT SHORT TERM GOAL #1   Title STGs=LTGs         PT Long Term Goals - 10/02/16 1436      PT LONG TERM GOAL #1   Title The patient will return demo HEP with written cues for neck flexibility and stabilization, LE strengthening, balance and general mobility.   Baseline 09/18/2016- has HEP for postural stabilization and flexibility.  She is not performing at home, therefore further HEP not added.    Status Not Met     PT LONG TERM GOAL #2   Title The patient will improve gait speed from 1.8 ft/sec to > or equal to 2.2 ft/sec to demo improved mobility.   Status Achieved     PT LONG TERM GOAL #3   Title The patient will improve neck A/ROM from 50 degrees to the left up to 60 degrees to demo improving neck ROM/flexibility.   Status Achieved     PT LONG TERM GOAL #4   Title The patient will have Berg completed and improve by 6 points.   Status Achieved     PT LONG TERM GOAL #5   Title The patient will report pain at rest to be < or equal to 4/10 to demo improved management of chronic pain.   Status Achieved        Remaining deficits: Patient continues with regular headaches, weakness.  She has HEP, however is not demonstrating carryover to home with exercises reporting barriers as not feeling well.   Education / Equipment: HEP, community wellness.  Plan: Patient agrees to discharge.  Patient goals were partially met. Patient is being discharged due to meeting the stated rehab goals.  ?????          Thank you for the referral of this patient. Rudell Cobb, MPT   Tracey Armstrong 10/03/2016, 8:47 AM  [facility] [address] [place], [place], [address] Phone: [phone]   Fax:  [phone]

## 2016-10-16 ENCOUNTER — Other Ambulatory Visit: Payer: Self-pay | Admitting: Specialist

## 2016-10-16 DIAGNOSIS — G971 Other reaction to spinal and lumbar puncture: Secondary | ICD-10-CM

## 2016-10-21 ENCOUNTER — Other Ambulatory Visit: Payer: Self-pay | Admitting: Neurology

## 2016-10-22 ENCOUNTER — Other Ambulatory Visit: Payer: Medicare Other

## 2016-10-22 ENCOUNTER — Inpatient Hospital Stay
Admission: RE | Admit: 2016-10-22 | Discharge: 2016-10-22 | Disposition: A | Discharge status: Home / Self Care | Payer: Medicare Other | Source: Ambulatory Visit | Attending: Specialist | Admitting: Specialist

## 2016-10-22 NOTE — Discharge Instructions (Signed)

## 2016-12-02 ENCOUNTER — Encounter: Payer: Self-pay | Admitting: Neurology

## 2016-12-02 ENCOUNTER — Ambulatory Visit (INDEPENDENT_AMBULATORY_CARE_PROVIDER_SITE_OTHER): Payer: Medicare Other | Admitting: Neurology

## 2016-12-02 VITALS — BP 132/72 | HR 97 | Ht 63.0 in | Wt 144.5 lb

## 2016-12-02 DIAGNOSIS — G8929 Other chronic pain: Secondary | ICD-10-CM | POA: Diagnosis not present

## 2016-12-02 DIAGNOSIS — R519 Headache, unspecified: Secondary | ICD-10-CM

## 2016-12-02 DIAGNOSIS — M545 Low back pain, unspecified: Secondary | ICD-10-CM

## 2016-12-02 DIAGNOSIS — R51 Headache: Secondary | ICD-10-CM

## 2016-12-02 HISTORY — DX: Low back pain, unspecified: M54.50

## 2016-12-02 HISTORY — DX: Other chronic pain: G89.29

## 2016-12-02 MED ORDER — LIDOCAINE 5 % EX PTCH: 1.0000 | MEDICATED_PATCH | CUTANEOUS | 2 refills | Status: DC

## 2016-12-02 NOTE — Progress Notes (Signed)
Reason for visit: Headache  Tracey Armstrong is an 82 y.o. female  History of present illness:  Tracey Armstrong is a 82 year old right-handed white female with a history of chronic migraine headache. The patient has different types of headache. She indicates that when she first wakes up in the morning she has a headache on the top of the head. She has clear sinus drainage that occurs and when this is complete, the headache on the top of the head goes away. The patient may have other headaches on the right parietal area. She has at least 2 headaches a week. The patient also has developed some tremors involving the hands bilaterally, she may drop things on occasion. The patient also reports significant issues with low back pain with some radiation into the upper thigh on the left. The patient has undergone a myelogram study looking for spinal fluid leak which was not found. The patient however does have evidence of potential nerve root impingement bilaterally left greater than right at the L4 and L5 levels. The aphasia is using a topical patch for the back pain with some benefit. The patient comes to this office for further evaluation.  Past Medical History:  Diagnosis Date  . Anxiety   . Chronic insomnia 03/30/2015  . Chronic low back pain 12/02/2016  . Depression   . GERD (gastroesophageal reflux disease)   . Hiatal hernia   . High cholesterol   . Hypertelorism   . Hypertension   . Migraine     Past Surgical History:  Procedure Laterality Date  . STOMACH SURGERY      Family History  Problem Relation Age of Onset  . Migraines Father   . Stroke Father   . Heart Problems Mother   . Epilepsy Brother     Social history:  reports that she has never smoked. She has never used smokeless tobacco. She reports that she does not drink alcohol or use drugs.    Allergies  Allergen Reactions  . Dihydrotachysterol     Other reaction(s): Other (See Comments)  . Trazodone And Nefazodone Other  (See Comments)    Worsens headaches  . Valium [Diazepam] Other (See Comments)    headaches  . Vitamin D Analogs Other (See Comments)    Medications:  Prior to Admission medications   Medication Sig Start Date End Date Taking? Authorizing Provider  alprazolam Duanne Moron) 2 MG tablet Take 2 mg by mouth. 2-3 TABLETS PO QD   Yes Historical Provider, MD  gabapentin (NEURONTIN) 300 MG capsule TK 1 C PO TID 05/21/16  Yes Historical Provider, MD  HYDROcodone-acetaminophen (NORCO/VICODIN) 5-325 MG per tablet Take 1 tablet by mouth every 6 (six) hours as needed for moderate pain.   Yes Historical Provider, MD  Magnesium 250 MG TABS Take 250 mg by mouth daily. 2 PO DAILY   Yes Historical Provider, MD  Riboflavin 400 MG CAPS Take 1 capsule (400 mg total) by mouth daily. 03/30/15  Yes Kathrynn Ducking, MD  rizatriptan (MAXALT-MLT) 10 MG disintegrating tablet Take 10 mg by mouth as needed for migraine. May repeat in 2 hours if needed   Yes Historical Provider, MD  tiZANidine (ZANAFLEX) 2 MG tablet Take 2 tablets (4 mg total) by mouth at bedtime. 08/19/16  Yes Kathrynn Ducking, MD  Verapamil HCl CR 200 MG CP24 TK 1 C PO QD 07/14/16  Yes Historical Provider, MD  lidocaine (LIDODERM) 5 % Place 1 patch onto the skin daily. Remove & Discard patch  within 12 hours or as directed by MD 12/02/16   Kathrynn Ducking, MD    ROS:  Out of a complete 14 system review of symptoms, the patient complains only of the following symptoms, and all other reviewed systems are negative.  Fatigue Ringing in the ears, runny nose, difficulty swallowing Double vision, blurred vision Frequent waking, daytime sleepiness Difficulty urinating Joint pain, back pain, muscle cramps, walking difficulty Memory loss, headache, speech difficulty, weakness, tremors Confusion, depression Bruising easily  Blood pressure 132/72, pulse 97, height 5\' 3"  (1.6 m), weight 144 lb 8 oz (65.5 kg), SpO2 97 %.  Physical Exam  General: The patient is  alert and cooperative at the time of the examination.  Skin: No significant peripheral edema is noted.   Neurologic Exam  Mental status: The patient is alert and oriented x 3 at the time of the examination. The patient has apparent normal recent and remote memory, with an apparently normal attention span and concentration ability.   Cranial nerves: Facial symmetry is present. Speech is normal, no aphasia or dysarthria is noted. Extraocular movements are full. Visual fields are full.  Motor: The patient has good strength in all 4 extremities.  Sensory examination: Soft touch sensation is symmetric on the face, arms, and legs.  Coordination: The patient has good finger-nose-finger and heel-to-shin bilaterally. Mild tremors are seen in both arms.  Gait and station: The patient has a normal gait. Tandem gait is unsteady. Romberg is negative. No drift is seen.  Reflexes: Deep tendon reflexes are symmetric.   Assessment/Plan:  1. Chronic migraine headache  2. Tremors, bilateral upper extremity  3. Chronic low back pain  4. Small 1 cm right parietal meningioma  The patient has brought MRI disc of the brain from 2017 for my review. This does show a very small meningioma in the right parietal area that has not enlarged over the last 3 or 4 years. I do not believe that she requires an annual MRI of the brain. The patient continues to have headaches, she has also developed tremors. She may go up on the gabapentin taking 400 mg 3 times daily to see if the tremor and headache can be improved. The patient does get benefit with the tizanidine taking 2 mg twice daily, she will continue this. The use of a beta blocker in the future may be considered, the verapamil may need to be discontinued. The patient will follow-up through this office in 6 months. She was given a prescription for a Lidoderm patch for the low back pain.  Jill Alexanders MD 12/02/2016 4:53 PM  [facility] [address] [place], [place] [address]  Phone [phone] Fax [phone]

## 2016-12-02 NOTE — Patient Instructions (Signed)
   Go up on the gabapentin to 400 mg three times a day.

## 2016-12-03 ENCOUNTER — Telehealth: Payer: Self-pay | Admitting: Neurology

## 2016-12-03 MED ORDER — PREDNISONE 5 MG PO TABS: ORAL_TABLET | ORAL | 0 refills | Status: DC

## 2016-12-03 NOTE — Telephone Encounter (Signed)
I called the patient. The patient has increased headache this morning, she had low back pain and left leg discomfort throughout the night and could not sleep. I will call in a prednisone Dosepak, 5 mg 6 day pack. He will call me if she continues to have discomfort, we may consider an epidural steroid injection of the low back.

## 2016-12-03 NOTE — Telephone Encounter (Signed)
Patient called in reference to left leg pain "throbbing".  Patient said she took all her medication last night along with the added 100mg  extra of gabapentin with no relief.  She has not slept at all last night due to left leg pain and right signed headache/throbs she has tried icy/hot with no relief.  Patient has reached out to [facility] to see if patches have been filled yet, but due to [employer] they are needing more information from our office in order for insurance to cover them.  Per patient she is going to take a maxalt-MLT to help with migraine she has gotten this morning when calling back please leave a message on machine.

## 2016-12-03 NOTE — Addendum Note (Signed)
Addended by: Margette Fast on: 12/03/2016 01:06 PM   Modules accepted: Orders

## 2016-12-03 NOTE — Telephone Encounter (Signed)
Dr willis- please advise 

## 2016-12-04 ENCOUNTER — Telehealth: Payer: Self-pay | Admitting: Neurology

## 2016-12-04 NOTE — Addendum Note (Signed)
Addended by: Margette Fast on: 12/04/2016 05:43 PM   Modules accepted: Orders

## 2016-12-04 NOTE — Telephone Encounter (Signed)
Dr Jannifer Franklin- please advise. I gave to you for review in your office. Patient was given this for low back pain but insurance will only cover for dx of post herpatic neuralgia. What would you like to do?

## 2016-12-04 NOTE — Telephone Encounter (Signed)
I called the patient. Her insurance will not pay for Lidoderm patch. The prednisone has helped the back and leg pain. ? Use an epidural injection for the pain if it returns.

## 2016-12-04 NOTE — Telephone Encounter (Signed)
Patient states [facility] on [address] needs PA for medication lidocaine (LIDODERM) 5 %.

## 2016-12-09 ENCOUNTER — Encounter: Payer: Self-pay | Admitting: Neurology

## 2016-12-09 ENCOUNTER — Ambulatory Visit (INDEPENDENT_AMBULATORY_CARE_PROVIDER_SITE_OTHER): Payer: Medicare Other | Admitting: Neurology

## 2016-12-09 VITALS — BP 104/67 | HR 64 | Ht 63.0 in | Wt 143.0 lb

## 2016-12-09 DIAGNOSIS — R519 Headache, unspecified: Secondary | ICD-10-CM

## 2016-12-09 DIAGNOSIS — R51 Headache: Secondary | ICD-10-CM | POA: Diagnosis not present

## 2016-12-09 MED ORDER — ZONISAMIDE 25 MG PO CAPS: 50.0000 mg | ORAL_CAPSULE | Freq: Two times a day (BID) | ORAL | 2 refills | Status: DC

## 2016-12-09 NOTE — Patient Instructions (Signed)
   With the Zonegran 25 mg take one twice a day for 2 weeks, then take 2 twice a day.

## 2016-12-09 NOTE — Progress Notes (Signed)
Reason for visit: Headache  Tracey Armstrong is an 82 y.o. female  History of present illness:  Tracey Armstrong is a 82 year old right-handed white female with a history of intractable migraine headaches. The patient comes in today on an urgent basis and claims that she fell 2 days ago while at home. The patient got up out of bed, felt dizzy, but she sat down and then started feeling better. When she got up again, she claims that she fell backwards and hurt her back, she then staggered about the room, falling against multiple objects, she eventually fell to the ground, she does not believe that she hit her head. She comes in stating that she has constant persistent right parietal headaches that never go away. Her prior revisit one week ago indicated that she was having intermittent headaches in the right parietal area occurring on average twice a week. The patient continues to have daily nasal drainage. She is convinced that she is draining spinal fluid. She is convinced that the small right parietal meningioma is the cause of her severe headaches. She wishes to have a neurosurgical referral to remove the tumor. She claims that when she went on prednisone for her low back pain, she had worsening headaches. She claims that she has some numbness of the left arm and leg since the fall. She has been on multiple medications the past for headaches without benefit including Botox.  Past Medical History:  Diagnosis Date  . Anxiety   . Chronic insomnia 03/30/2015  . Chronic low back pain 12/02/2016  . Depression   . GERD (gastroesophageal reflux disease)   . Hiatal hernia   . High cholesterol   . Hypertelorism   . Hypertension   . Migraine     Past Surgical History:  Procedure Laterality Date  . STOMACH SURGERY      Family History  Problem Relation Age of Onset  . Migraines Father   . Stroke Father   . Heart Problems Mother   . Epilepsy Brother     Social history:  reports that she has  never smoked. She has never used smokeless tobacco. She reports that she does not drink alcohol or use drugs.    Allergies  Allergen Reactions  . Dihydrotachysterol     Other reaction(s): Other (See Comments)  . Trazodone And Nefazodone Other (See Comments)    Worsens headaches  . Valium [Diazepam] Other (See Comments)    headaches  . Vitamin D Analogs Other (See Comments)    Medications:  Prior to Admission medications   Medication Sig Start Date End Date Taking? Authorizing Provider  alprazolam Duanne Moron) 2 MG tablet Take 2 mg by mouth. 2-3 TABLETS PO QD   Yes Historical Provider, MD  gabapentin (NEURONTIN) 300 MG capsule TK 1 C PO TID 05/21/16  Yes Historical Provider, MD  HYDROcodone-acetaminophen (NORCO/VICODIN) 5-325 MG per tablet Take 1 tablet by mouth every 6 (six) hours as needed for moderate pain.   Yes Historical Provider, MD  Magnesium 250 MG TABS Take 250 mg by mouth daily. 2 PO DAILY   Yes Historical Provider, MD  Riboflavin 400 MG CAPS Take 1 capsule (400 mg total) by mouth daily. 03/30/15  Yes Kathrynn Ducking, MD  rizatriptan (MAXALT-MLT) 10 MG disintegrating tablet Take 10 mg by mouth as needed for migraine. May repeat in 2 hours if needed   Yes Historical Provider, MD  tiZANidine (ZANAFLEX) 2 MG tablet Take 2 tablets (4 mg total) by mouth at  bedtime. 08/19/16  Yes Kathrynn Ducking, MD  Verapamil HCl CR 200 MG CP24 TK 1 C PO QD 07/14/16  Yes Historical Provider, MD  zonisamide (ZONEGRAN) 25 MG capsule Take 2 capsules (50 mg total) by mouth 2 (two) times daily. 12/09/16   Kathrynn Ducking, MD    ROS:  Out of a complete 14 system review of symptoms, the patient complains only of the following symptoms, and all other reviewed systems are negative.  Appetite change Eye discharge, eye redness, double vision, eye pain, blurred vision Cold intolerance  Blood pressure 104/67, pulse 64, height 5\' 3"  (1.6 m), weight 143 lb (64.9 kg).  Physical Exam  General: The patient is  alert and cooperative at the time of the examination.  Neuromuscular: Range of movement of the cervical spine is full.  Skin: No significant peripheral edema is noted.   Neurologic Exam  Mental status: The patient is alert and oriented x 3 at the time of the examination. The patient has apparent normal recent and remote memory, with an apparently normal attention span and concentration ability.   Cranial nerves: Facial symmetry is present. Speech is normal, no aphasia or dysarthria is noted. Extraocular movements are full. Visual fields are full.  Motor: The patient has good strength in all 4 extremities.  Sensory examination: Soft touch sensation is symmetric on the face, arms, and legs.  Coordination: The patient has good finger-nose-finger and heel-to-shin bilaterally.  Gait and station: The patient has a minimally wide based gait, the patient is able to walk independently. Tandem gait was not attempted. Romberg is negative. No drift is seen.  Reflexes: Deep tendon reflexes are symmetric.   Assessment/Plan:  1. Recent fall  2. Intractable migraine headache  3. Small right parietal meningioma  I have indicated to the patient that I do not believe that the meningioma has anything to do with her headaches. The meningioma is small, it is not associated with edema of the brain or compression of the brain. The patient is upset that I will not refer her to a neurosurgeon. The patient will be placed on low-dose Zonegran, the dose will be increased gradually. She claims that prednisone worsened her headache. The patient appears to be acting in an unusual fashion, she is somewhat hysterical, she appears to have factitious tremors during the examination. She will follow-up for her next scheduled revisit.  Jill Alexanders MD 12/09/2016 4:56 PM  [facility] [address] [place], [place] [address]  Phone [phone] Fax [phone]

## 2016-12-11 DIAGNOSIS — N3000 Acute cystitis without hematuria: Secondary | ICD-10-CM | POA: Diagnosis not present

## 2016-12-12 DIAGNOSIS — I10 Essential (primary) hypertension: Secondary | ICD-10-CM | POA: Diagnosis not present

## 2017-01-22 ENCOUNTER — Other Ambulatory Visit: Payer: Self-pay | Admitting: Neurology

## 2017-02-04 DIAGNOSIS — G894 Chronic pain syndrome: Secondary | ICD-10-CM | POA: Diagnosis not present

## 2017-02-04 DIAGNOSIS — G4700 Insomnia, unspecified: Secondary | ICD-10-CM | POA: Diagnosis not present

## 2017-02-04 DIAGNOSIS — G43909 Migraine, unspecified, not intractable, without status migrainosus: Secondary | ICD-10-CM | POA: Diagnosis not present

## 2017-02-04 DIAGNOSIS — E559 Vitamin D deficiency, unspecified: Secondary | ICD-10-CM | POA: Diagnosis not present

## 2017-02-04 DIAGNOSIS — F331 Major depressive disorder, recurrent, moderate: Secondary | ICD-10-CM | POA: Diagnosis not present

## 2017-02-04 DIAGNOSIS — M1990 Unspecified osteoarthritis, unspecified site: Secondary | ICD-10-CM | POA: Diagnosis not present

## 2017-02-09 ENCOUNTER — Telehealth: Payer: Self-pay | Admitting: Neurology

## 2017-02-09 NOTE — Telephone Encounter (Signed)
Please connect me,

## 2017-02-09 NOTE — Telephone Encounter (Signed)
Nita with [facility] Physician called office in reference to Dr. Justin Mend requesting a returned called from Dr. Krista Blue in reference to patient switching to Dr. Krista Blue

## 2017-02-10 ENCOUNTER — Telehealth: Payer: Self-pay | Admitting: Neurology

## 2017-02-10 NOTE — Telephone Encounter (Signed)
I have talked with her primary care physician Dr. Bunnie Philips, she has chronic migraine, MRI of the brain 2014, please fit her in my clinic in my next available

## 2017-02-10 NOTE — Telephone Encounter (Signed)
She has been placed on Dr. Rhea Belton schedule on 02/26/17 at 2:30pm.  She is aware to arrive to our office at 2:00pm.

## 2017-02-26 ENCOUNTER — Encounter: Payer: Self-pay | Admitting: Neurology

## 2017-02-26 ENCOUNTER — Ambulatory Visit (INDEPENDENT_AMBULATORY_CARE_PROVIDER_SITE_OTHER): Payer: Medicare Other | Admitting: Neurology

## 2017-02-26 VITALS — BP 128/65 | HR 63 | Ht 63.0 in | Wt 137.5 lb

## 2017-02-26 DIAGNOSIS — G43709 Chronic migraine without aura, not intractable, without status migrainosus: Secondary | ICD-10-CM

## 2017-02-26 DIAGNOSIS — E538 Deficiency of other specified B group vitamins: Secondary | ICD-10-CM | POA: Diagnosis not present

## 2017-02-26 DIAGNOSIS — E559 Vitamin D deficiency, unspecified: Secondary | ICD-10-CM | POA: Diagnosis not present

## 2017-02-26 DIAGNOSIS — R52 Pain, unspecified: Secondary | ICD-10-CM

## 2017-02-26 DIAGNOSIS — R2689 Other abnormalities of gait and mobility: Secondary | ICD-10-CM | POA: Diagnosis not present

## 2017-02-26 DIAGNOSIS — IMO0002 Reserved for concepts with insufficient information to code with codable children: Secondary | ICD-10-CM

## 2017-02-26 MED ORDER — DULOXETINE HCL 20 MG PO CPEP: 20.0000 mg | ORAL_CAPSULE | Freq: Every day | ORAL | 11 refills | Status: DC

## 2017-02-26 NOTE — Progress Notes (Signed)
PATIENT: Tracey Armstrong DOB: July 03, 1935  Chief Complaint  Patient presents with  . Migraine    She is here with her caregiver, Tracey Armstrong. Reports daily headaches that vary in severity.  She was last seen by Dr. Jannifer Franklin in February 2017.  She never tried the zonegran provided by him at her last appointment.  She has history of meningioma.     HISTORICAL  Tracey Armstrong is a 82 years old right-handed female, accompanied by her caregiver Tracey Armstrong, seen in refer by her primary care physician Dr. Maurice Small for migraine headache, initial evaluation was on Feb 26 2017.  She complains of chronic migraine headaches, getting worse over the past few years, especially since 2018, she complains of constant headache, especially at the right parietal region, concurrent with her worsening depression," I am hurting so bad, I cannot do anything"  She had a history of gastric ulcer require surgery April 2015, since then, she has frequent nausea, but could not throw up, per patient, this has made her migraine headache, and associated nausea more miserable longer-lasting. She complains that she woke up with 10 out of 10 headache, burning sensation at right parietal region,  In addition, she complains whole body achy pain, carry a diagnosis of fibromyalgia, Tracey Armstrong has worked with her since December 2017, she noted the patient is sedentary, depressed,   Patient stated she has tried different antidepression in the past, Celexa, Effexor, Lexapro, Cymbalta, Zoloft, none of the medicine works for her,  She had a history of small right paravertebral region meningioma, we have personally reviewed MRI of the brain without contrast in July 2014, has been fairly stable,  We have personally reviewed CT myelogram in 2017, CT lumbar showed multilevel degenerative disc disease, most severe at L4-5, no evidence of significant canal stenosis, possible L4 nerve roots compression, MRI of the thoracic, no significant canal or  foraminal stenosis, but there was no significant canal or foraminal stenosis.  REVIEW OF SYSTEMS: Full 14 system review of systems performed and notable only for as above  ALLERGIES: Allergies  Allergen Reactions  . Dihydrotachysterol     Other reaction(s): Other (See Comments)  . Trazodone And Nefazodone Other (See Comments)    Worsens headaches  . Valium [Diazepam] Other (See Comments)    headaches  . Vitamin D Analogs Other (See Comments)    HOME MEDICATIONS: Current Outpatient Prescriptions  Medication Sig Dispense Refill  . alprazolam (XANAX) 2 MG tablet Take 2 mg by mouth. 2-3 TABLETS PO QD    . gabapentin (NEURONTIN) 300 MG capsule TK 1 C PO TID  3  . HYDROcodone-acetaminophen (NORCO/VICODIN) 5-325 MG per tablet Take 1 tablet by mouth every 6 (six) hours as needed for moderate pain.    . Magnesium 250 MG TABS Take 250 mg by mouth daily. 2 PO DAILY    . MAXALT 10 MG tablet Take 10 mg by mouth as needed for migraine. May repeat in 2 hours if needed.  Pt requires name brand.    . meloxicam (MOBIC) 15 MG tablet Take 15 mg by mouth daily.  11  . Riboflavin 400 MG CAPS Take 1 capsule (400 mg total) by mouth daily.    Marland Kitchen SILENOR 3 MG TABS Take 3 mg by mouth at bedtime.  5  . tiZANidine (ZANAFLEX) 2 MG tablet TAKE 2 TABLETS BY MOUTH AT BEDTIME 180 tablet 3  . verapamil (VERELAN PM) 180 MG 24 hr capsule Take 180 mg by mouth daily.  3   No current facility-administered medications for this visit.     PAST MEDICAL HISTORY: Past Medical History:  Diagnosis Date  . Anxiety   . Chronic insomnia 03/30/2015  . Chronic low back pain 12/02/2016  . Depression   . GERD (gastroesophageal reflux disease)   . Hiatal hernia   . High cholesterol   . Hypertelorism   . Hypertension   . Meningioma ([place])   . Migraine     PAST SURGICAL HISTORY: Past Surgical History:  Procedure Laterality Date  . STOMACH SURGERY      FAMILY HISTORY: Family History  Problem Relation Age of Onset  .  Migraines Father   . Stroke Father   . Heart Problems Mother   . Epilepsy Brother     SOCIAL HISTORY:  Social History   Social History  . Marital status: Legally Separated    Spouse name: N/A  . Number of children: 4  . Years of education: 12   Occupational History  . Retired    Social History Main Topics  . Smoking status: Never Smoker  . Smokeless tobacco: Never Used  . Alcohol use No  . Drug use: No  . Sexual activity: Not on file   Other Topics Concern  . Not on file   Social History Narrative   Lives at home alone   Patient drinks 1 glass of tea daily.   Patient is right handed.     PHYSICAL EXAM   Vitals:   02/26/17 1419  BP: 128/65  Pulse: 63  Weight: 137 lb 8 oz (62.4 kg)  Height: 5' 3"  (1.6 m)    Not recorded      Body mass index is 24.36 kg/m.  PHYSICAL EXAMNIATION:  Gen: NAD, conversant, well nourised, obese, well groomed                     Cardiovascular: Regular rate rhythm, no peripheral edema, warm, nontender. Eyes: Conjunctivae clear without exudates or hemorrhage Neck: Supple, no carotid bruits. Pulmonary: Clear to auscultation bilaterally   NEUROLOGICAL EXAM:  MENTAL STATUS: Speech:    Speech is normal; fluent and spontaneous with normal comprehension.  Cognition:     Orientation to time, place and person     Normal recent and remote memory     Normal Attention span and concentration     Normal Language, naming, repeating,spontaneous speech     Fund of knowledge   CRANIAL NERVES: CN II: Visual fields are full to confrontation. Fundoscopic exam is normal with sharp discs and no vascular changes. Pupils are round equal and briskly reactive to light. CN III, IV, VI: extraocular movement are normal. No ptosis. CN V: Facial sensation is intact to pinprick in all 3 divisions bilaterally. Corneal responses are intact.  CN VII: Face is symmetric with normal eye closure and smile. CN VIII: Hearing is normal to rubbing fingers CN  IX, X: Palate elevates symmetrically. Phonation is normal. CN XI: Head turning and shoulder shrug are intact CN XII: Tongue is midline with normal movements and no atrophy.  MOTOR: There is no pronator drift of out-stretched arms. Muscle bulk and tone are normal. Muscle strength is normal.  REFLEXES: Reflexes are 2+ and symmetric at the biceps, triceps, knees, and ankles. Plantar responses are flexor.  SENSORY: Intact to light touch, pinprick, positional sensation and vibratory sensation are intact in fingers and toes.  COORDINATION: Rapid alternating movements and fine finger movements are intact. There is no dysmetria on  finger-to-nose and heel-knee-shin.    GAIT/STANCE: Posture is normal. Gait is steady with normal steps, base, arm swing, and turning. Heel and toe walking are normal. Tandem gait is normal.  Romberg is absent.   DIAGNOSTIC DATA (LABS, IMAGING, TESTING) - I reviewed patient records, labs, notes, testing and imaging myself where available.   ASSESSMENT AND PLAN  Tracey Armstrong is a 82 y.o. female    Chronic headache Possible right parietal meningioma  Patient desired repeat MRI of the brain with and without contrast follow-up on the meningioma  Laboratory evaluation to rule out metabolic cause for her constant headache including ESR C-reactive protein to rule out temporal arteritis  Restart Cymbalta 20 mg daily, continue gabapentin 300 mg every night as preventative medications  Maxalt as needed   Marcial Pacas, M.D. Ph.D.  [facility] [address], [place], [place] [address] Ph: [phone] Fax: [phone]  CC: Maurice Small, MD

## 2017-02-27 LAB — COMPREHENSIVE METABOLIC PANEL
ALT: 13 IU/L (ref 0–32)
AST: 17 IU/L (ref 0–40)
Albumin/Globulin Ratio: 1.7 (ref 1.2–2.2)
Albumin: 4.2 g/dL (ref 3.5–4.7)
Alkaline Phosphatase: 50 IU/L (ref 39–117)
BILIRUBIN TOTAL: 0.2 mg/dL (ref 0.0–1.2)
BUN/Creatinine Ratio: 13 (ref 12–28)
BUN: 11 mg/dL (ref 8–27)
CHLORIDE: 96 mmol/L (ref 96–106)
CO2: 23 mmol/L (ref 18–29)
Calcium: 9.9 mg/dL (ref 8.7–10.3)
Creatinine, Ser: 0.88 mg/dL (ref 0.57–1.00)
GFR calc Af Amer: 72 mL/min/{1.73_m2} (ref >59)
GFR calc non Af Amer: 62 mL/min/{1.73_m2} (ref >59)
GLUCOSE: 97 mg/dL (ref 65–99)
Globulin, Total: 2.5 g/dL (ref 1.5–4.5)
POTASSIUM: 4.8 mmol/L (ref 3.5–5.2)
Sodium: 134 mmol/L (ref 134–144)
Total Protein: 6.7 g/dL (ref 6.0–8.5)

## 2017-02-27 LAB — CBC WITH DIFFERENTIAL
BASOS ABS: 0 10*3/uL (ref 0.0–0.2)
BASOS: 0 %
EOS (ABSOLUTE): 0 10*3/uL (ref 0.0–0.4)
Eos: 0 %
Hematocrit: 35 % (ref 34.0–46.6)
Hemoglobin: 11.7 g/dL (ref 11.1–15.9)
Immature Grans (Abs): 0 10*3/uL (ref 0.0–0.1)
Immature Granulocytes: 0 %
Lymphocytes Absolute: 1.5 10*3/uL (ref 0.7–3.1)
Lymphs: 32 %
MCH: 29.6 pg (ref 26.6–33.0)
MCHC: 33.4 g/dL (ref 31.5–35.7)
MCV: 89 fL (ref 79–97)
MONOS ABS: 0.4 10*3/uL (ref 0.1–0.9)
Monocytes: 9 %
NEUTROS ABS: 2.8 10*3/uL (ref 1.4–7.0)
NEUTROS PCT: 59 %
RBC: 3.95 x10E6/uL (ref 3.77–5.28)
RDW: 13.8 % (ref 12.3–15.4)
WBC: 4.8 10*3/uL (ref 3.4–10.8)

## 2017-02-27 LAB — CK: Total CK: 42 U/L (ref 24–173)

## 2017-02-27 LAB — VITAMIN B12: VITAMIN B 12: 1583 pg/mL — AB (ref 232–1245)

## 2017-02-27 LAB — C-REACTIVE PROTEIN: CRP: 0.8 mg/L (ref 0.0–4.9)

## 2017-02-27 LAB — SEDIMENTATION RATE: SED RATE: 5 mm/h (ref 0–40)

## 2017-02-27 LAB — RPR: RPR: NONREACTIVE

## 2017-02-27 LAB — TSH: TSH: 0.756 u[IU]/mL (ref 0.450–4.500)

## 2017-02-27 LAB — VITAMIN D 25 HYDROXY (VIT D DEFICIENCY, FRACTURES): Vit D, 25-Hydroxy: 33.5 ng/mL (ref 30.0–100.0)

## 2017-02-27 LAB — ANA W/REFLEX IF POSITIVE: Anti Nuclear Antibody(ANA): NEGATIVE

## 2017-03-02 NOTE — Addendum Note (Signed)
Addended by: Marcial Pacas on: 03/02/2017 02:32 PM   Modules accepted: Level of Service

## 2017-03-03 ENCOUNTER — Telehealth: Payer: Self-pay | Admitting: Neurology

## 2017-03-03 NOTE — Telephone Encounter (Addendum)
Patient is aware of results.  She would like a copy mailed to her and faxed to her PCP, Dr. Justin Mend.  This request has been completed.

## 2017-03-03 NOTE — Telephone Encounter (Signed)
Patient called office in reference to lab results.  Please call

## 2017-03-04 DIAGNOSIS — R51 Headache: Secondary | ICD-10-CM | POA: Diagnosis not present

## 2017-03-16 DIAGNOSIS — M4804 Spinal stenosis, thoracic region: Secondary | ICD-10-CM | POA: Diagnosis not present

## 2017-03-16 DIAGNOSIS — D329 Benign neoplasm of meninges, unspecified: Secondary | ICD-10-CM | POA: Diagnosis not present

## 2017-03-16 DIAGNOSIS — I6782 Cerebral ischemia: Secondary | ICD-10-CM | POA: Diagnosis not present

## 2017-03-16 DIAGNOSIS — M4802 Spinal stenosis, cervical region: Secondary | ICD-10-CM | POA: Diagnosis not present

## 2017-03-16 DIAGNOSIS — M5030 Other cervical disc degeneration, unspecified cervical region: Secondary | ICD-10-CM | POA: Diagnosis not present

## 2017-03-16 DIAGNOSIS — Z79899 Other long term (current) drug therapy: Secondary | ICD-10-CM | POA: Diagnosis not present

## 2017-03-16 DIAGNOSIS — D320 Benign neoplasm of cerebral meninges: Secondary | ICD-10-CM | POA: Diagnosis not present

## 2017-03-16 DIAGNOSIS — M9953 Intervertebral disc stenosis of neural canal of lumbar region: Secondary | ICD-10-CM | POA: Diagnosis not present

## 2017-03-16 DIAGNOSIS — R251 Tremor, unspecified: Secondary | ICD-10-CM | POA: Diagnosis not present

## 2017-03-16 DIAGNOSIS — M5126 Other intervertebral disc displacement, lumbar region: Secondary | ICD-10-CM | POA: Diagnosis not present

## 2017-03-16 DIAGNOSIS — M9951 Intervertebral disc stenosis of neural canal of cervical region: Secondary | ICD-10-CM | POA: Diagnosis not present

## 2017-03-16 DIAGNOSIS — R51 Headache: Secondary | ICD-10-CM | POA: Diagnosis not present

## 2017-03-16 DIAGNOSIS — R7989 Other specified abnormal findings of blood chemistry: Secondary | ICD-10-CM | POA: Diagnosis not present

## 2017-03-16 DIAGNOSIS — M48061 Spinal stenosis, lumbar region without neurogenic claudication: Secondary | ICD-10-CM | POA: Diagnosis not present

## 2017-03-16 DIAGNOSIS — Z8669 Personal history of other diseases of the nervous system and sense organs: Secondary | ICD-10-CM | POA: Diagnosis not present

## 2017-03-16 DIAGNOSIS — M47812 Spondylosis without myelopathy or radiculopathy, cervical region: Secondary | ICD-10-CM | POA: Diagnosis not present

## 2017-03-25 DIAGNOSIS — R197 Diarrhea, unspecified: Secondary | ICD-10-CM | POA: Diagnosis not present

## 2017-04-08 DIAGNOSIS — E611 Iron deficiency: Secondary | ICD-10-CM | POA: Diagnosis not present

## 2017-04-08 DIAGNOSIS — R5383 Other fatigue: Secondary | ICD-10-CM | POA: Diagnosis not present

## 2017-04-08 DIAGNOSIS — E559 Vitamin D deficiency, unspecified: Secondary | ICD-10-CM | POA: Diagnosis not present

## 2017-04-15 DIAGNOSIS — F331 Major depressive disorder, recurrent, moderate: Secondary | ICD-10-CM | POA: Diagnosis not present

## 2017-04-15 DIAGNOSIS — G8929 Other chronic pain: Secondary | ICD-10-CM | POA: Diagnosis not present

## 2017-04-17 ENCOUNTER — Telehealth: Payer: Self-pay | Admitting: Neurology

## 2017-04-17 NOTE — Telephone Encounter (Signed)
Patient called office requesting to switch from Dr. Krista Blue to Dr. Jaynee Eagles for Parkinsons/migraines.  Please call

## 2017-04-23 DIAGNOSIS — H2513 Age-related nuclear cataract, bilateral: Secondary | ICD-10-CM | POA: Diagnosis not present

## 2017-04-23 NOTE — Telephone Encounter (Signed)
Spoke to patient - she would like to continue care with Dr. Krista Blue.

## 2017-04-23 NOTE — Telephone Encounter (Signed)
Patient has already seen 2 excellent providers in our office and now wants to switch to a 3rd provider for unknown reasons. I do not have anymore to offer patient. She can go back to Dr. Jannifer Franklin if she prefers. thanks.

## 2017-04-24 DIAGNOSIS — M542 Cervicalgia: Secondary | ICD-10-CM | POA: Diagnosis not present

## 2017-04-24 DIAGNOSIS — M545 Low back pain: Secondary | ICD-10-CM | POA: Diagnosis not present

## 2017-04-24 DIAGNOSIS — M25551 Pain in right hip: Secondary | ICD-10-CM | POA: Diagnosis not present

## 2017-04-24 DIAGNOSIS — M546 Pain in thoracic spine: Secondary | ICD-10-CM | POA: Diagnosis not present

## 2017-04-24 DIAGNOSIS — M25552 Pain in left hip: Secondary | ICD-10-CM | POA: Diagnosis not present

## 2017-04-28 ENCOUNTER — Ambulatory Visit: Payer: Medicare Other | Admitting: Physical Therapy

## 2017-05-01 ENCOUNTER — Ambulatory Visit: Payer: Medicare Other | Admitting: Neurology

## 2017-05-06 DIAGNOSIS — Z111 Encounter for screening for respiratory tuberculosis: Secondary | ICD-10-CM | POA: Diagnosis not present

## 2017-05-20 ENCOUNTER — Ambulatory Visit (INDEPENDENT_AMBULATORY_CARE_PROVIDER_SITE_OTHER): Payer: Medicare Other | Admitting: Family Medicine

## 2017-05-20 ENCOUNTER — Encounter: Payer: Self-pay | Admitting: Family Medicine

## 2017-05-20 VITALS — BP 118/70 | HR 86 | Resp 12 | Ht 63.0 in | Wt 130.2 lb

## 2017-05-20 DIAGNOSIS — R519 Headache, unspecified: Secondary | ICD-10-CM

## 2017-05-20 DIAGNOSIS — G894 Chronic pain syndrome: Secondary | ICD-10-CM | POA: Diagnosis not present

## 2017-05-20 DIAGNOSIS — R51 Headache: Secondary | ICD-10-CM

## 2017-05-20 DIAGNOSIS — K209 Esophagitis, unspecified without bleeding: Secondary | ICD-10-CM

## 2017-05-20 DIAGNOSIS — F5104 Psychophysiologic insomnia: Secondary | ICD-10-CM

## 2017-05-20 DIAGNOSIS — M545 Low back pain: Secondary | ICD-10-CM | POA: Diagnosis not present

## 2017-05-20 DIAGNOSIS — M270 Developmental disorders of jaws: Secondary | ICD-10-CM | POA: Diagnosis not present

## 2017-05-20 DIAGNOSIS — I10 Essential (primary) hypertension: Secondary | ICD-10-CM | POA: Diagnosis not present

## 2017-05-20 DIAGNOSIS — G8929 Other chronic pain: Secondary | ICD-10-CM

## 2017-05-20 DIAGNOSIS — F411 Generalized anxiety disorder: Secondary | ICD-10-CM

## 2017-05-20 DIAGNOSIS — R296 Repeated falls: Secondary | ICD-10-CM | POA: Diagnosis not present

## 2017-05-20 MED ORDER — ZOLPIDEM TARTRATE 5 MG PO TABS: 5.0000 mg | ORAL_TABLET | Freq: Every evening | ORAL | 0 refills | Status: DC | PRN

## 2017-05-20 MED ORDER — ALPRAZOLAM 0.5 MG PO TBDP: 0.5000 mg | ORAL_TABLET | Freq: Every evening | ORAL | 1 refills | Status: DC | PRN

## 2017-05-20 NOTE — Progress Notes (Signed)
HPI:   Tracey Armstrong is a 82 y.o. female, who is here today with her son to establish care.  Former PCP: Dr Justin Mend at [facility], [place] Last preventive routine visit: 07/2016.  About 4-5 days ago she moved to assisted living. She was living independent but for the past 6 months she has had a few falls, about 4. She is not longed feeling comfortable driving and was hard for her to keep appts.   Chronic medical problems: Chronic back pain, anxiety,insomnia, HTN,migraine/tension like headache,GERD, and generalized OA among some.   Concerns today:   She is requesting refills for Hydrocodone-Acetaminophen 5-325 mg, which she has takes for a few months for chronic back and joint pain. Arthralgias mainly of hips and knees. Lumbar,throracic,and cervical back pain. She does not take Mobic because she is afraid of side effects.  She is also on Alprazolam and Ambien. Alprazolam dose has been gradually decreased from 4 mg daily to now 0.5 mg daily. She is supposed to be on gabapentin and Wellbutrin but discontinued medications because side effects. Gabapentin was causing memory issues and Wellbutrin aggravates headaches. Cymbalta aggravate headache. Depression and anxiety for 20+ years. According to pt, she has tried several antidepressants (Lexapro,Celexa,Effexor, and Zoloft among some) and they all have caused headaches.  She denies suicidal thoughts.  Insomnia: She is currently on Ambien 10 mg, which she does not feel like is helping. She thinks it is not helping because she is taking it earlier.  She has tried Doxepin 3 mg but did not help much and exacerbated headaches.  GERD: She is not longer taking Prevacid, she does not feel like she needs it. S/P Nissen fundoplication. C/O nausea and difficulty swallowing, both of this chronic.States that after GI surgery she cannot throw up. She attributes nausea and difficulty swallowing to palate prominence. She is planning on  arranging appt with dentist and ask for removal options. It has not grown.  + Constipation, she takes Miralax as needed.  She follows with neurologists, Dr Krista Blue: Headaches (severe, mainly parietal) and numbness (hands and hands,intermittently). About 2 years ago small right parietal meningioma was found on brain MRI, thought this was not the etiology of her headaches. She has also been evaluated by ENT, Dr Kalman Shan, due to chronic headaches and recommended to continue following with neurosurgeon.  Lab Results  Component Value Date   CRP 0.8 02/26/2017   Lab Results  Component Value Date   ESRSEDRATE 5 02/26/2017   Noted mild hand and head tremor on examination, she reports this as stable. Denies alcohol abuse.  She is on Zanaflex 2 mg 2 tabs at bedtime.  HTN:  She is currently on Verapamil ER 180 mg daily, which was recommended also for headaches. Denies visual changes, chest pain, dyspnea, palpitation,focal weakness, or edema.  Lab Results  Component Value Date   CREATININE 0.88 02/26/2017   BUN 11 02/26/2017   NA 134 02/26/2017   K 4.8 02/26/2017   CL 96 02/26/2017   CO2 23 02/26/2017   She also takes B-2 400 mg daily. Lab Results  Component Value Date   [id number] 6,767 (H) 02/26/2017    Still taking B12 supplementation.  She has forms from assisted living that she needs fill out.   Review of Systems  Constitutional: Positive for activity change and fatigue. Negative for appetite change and fever.  HENT: Positive for trouble swallowing. Negative for facial swelling, mouth sores, nosebleeds, sore throat and voice change.   Eyes:  Negative for redness and visual disturbance.  Respiratory: Negative for cough, shortness of breath and wheezing.   Cardiovascular: Negative for chest pain, palpitations and leg swelling.  Gastrointestinal: Positive for nausea. Negative for abdominal pain and vomiting.       No changes in bowel habits.  Endocrine: Negative for polydipsia,  polyphagia and polyuria.  Genitourinary: Negative for decreased urine volume, dysuria and hematuria.  Musculoskeletal: Positive for arthralgias, back pain, gait problem and neck pain. Negative for myalgias.  Skin: Negative for rash.  Neurological: Positive for numbness and headaches. Negative for seizures, syncope, facial asymmetry and weakness.  Psychiatric/Behavioral: Positive for sleep disturbance. Negative for confusion. The patient is nervous/anxious.       Current Outpatient Prescriptions on File Prior to Visit  Medication Sig Dispense Refill  . Magnesium 250 MG TABS Take 250 mg by mouth daily. 2 PO DAILY    . MAXALT 10 MG tablet Take 10 mg by mouth as needed for migraine. May repeat in 2 hours if needed.  Pt requires name brand.    . meloxicam (MOBIC) 15 MG tablet Take 15 mg by mouth daily.  11  . Riboflavin 400 MG CAPS Take 1 capsule (400 mg total) by mouth daily.    Marland Kitchen tiZANidine (ZANAFLEX) 2 MG tablet TAKE 2 TABLETS BY MOUTH AT BEDTIME 180 tablet 3   No current facility-administered medications on file prior to visit.      Past Medical History:  Diagnosis Date  . Anxiety   . Arthritis   . Chronic insomnia 03/30/2015  . Chronic low back pain 12/02/2016  . Depression   . GERD (gastroesophageal reflux disease)   . Hiatal hernia   . High cholesterol   . Hypertelorism   . Hypertension   . Meningioma ([place])   . Migraine    Allergies  Allergen Reactions  . Dihydrotachysterol     Other reaction(s): Other (See Comments)  . Trazodone And Nefazodone Other (See Comments)    Worsens headaches  . Valium [Diazepam] Other (See Comments)    headaches  . Vitamin D Analogs Other (See Comments)    Family History  Problem Relation Age of Onset  . Migraines Father   . Stroke Father   . Heart Problems Mother   . Epilepsy Brother     Social History   Social History  . Marital status: Widowed    Spouse name: N/A  . Number of children: 4  . Years of education: 12    Occupational History  . Retired    Social History Main Topics  . Smoking status: Never Smoker  . Smokeless tobacco: Never Used  . Alcohol use No  . Drug use: No  . Sexual activity: Not Currently   Other Topics Concern  . None   Social History Narrative   Lives at home alone   Patient drinks 1 glass of tea daily.   Patient is right handed.    Vitals:   05/20/17 1521  BP: 118/70  Pulse: 86  Resp: 12   O2 sat at RA 98% Body mass index is 23.06 kg/m.  Physical Exam  Nursing note and vitals reviewed. Constitutional: She is oriented to person, place, and time. She appears well-developed and well-nourished. No distress.  HENT:  Head: Atraumatic.  Mouth/Throat: Uvula is midline, oropharynx is clear and moist and mucous membranes are normal.  Hard prominence on hard palate,no erythematous or tender.  Eyes: Pupils are equal, round, and reactive to light. Conjunctivae and EOM are normal.  Neck: No tracheal deviation present. No thyroid mass and no thyromegaly present.  Cardiovascular: Normal rate and regular rhythm.   No murmur heard. Pulses:      Dorsalis pedis pulses are 2+ on the right side, and 2+ on the left side.  Respiratory: Effort normal and breath sounds normal. No respiratory distress.  GI: Soft. She exhibits no mass. There is no hepatomegaly. There is no tenderness.  Musculoskeletal: She exhibits no edema.       Thoracic back: She exhibits deformity (Kyphosis). She exhibits no tenderness.  No trigger points.  Lymphadenopathy:    She has no cervical adenopathy.  Neurological: She is alert and oriented to person, place, and time. She has normal strength. She displays tremor (mild on hands and face, not seen at rest.). Coordination normal.  No focal deficit appreciated. Unstable gait assisted by son.  Skin: Skin is warm. No erythema.  Psychiatric: Her mood appears anxious. She expresses no suicidal ideation.  Well groomed, good eye contact.     ASSESSMENT  AND PLAN:   [name] was seen today for establish care.  Diagnoses and all orders for this visit:  Chronic insomnia  She has tried several medications including Trazodone, Doxepin but not well tolerated. Some side effects of Ambien discussed, recommend decreasing dose from 10 mg to 5 mg and planning on continue weaning off. F/U in 4 weeks.  -     zolpidem (AMBIEN) 5 MG tablet; Take 1 tablet (5 mg total) by mouth at bedtime as needed for sleep.  Chronic midline low back pain without sciatica  She denies Hx of fibromyalgia but reviewing some records it seems like she has been Dx in the past. Continue Zanaflex. Topical OTC Asper ream with Lidocaine may also help.  Chronic pain syndrome  Explained some of risks of taking opioids at her age. I do not feel comfortable prescribing Hydrocodone-Acetaminophen. She has had dose decreased and now taking max a tab daily. She cannot decide today if she wants to establish with pain management.   Generalized anxiety disorder  For now she will continue Alprazolam 0.5 mg daily as needed. Side effects discussed. She has not tolerated other medications in the past. She will benefit from counseling and may need psychiatric consultation.   -     ALPRAZolam (NIRAVAM) 0.5 MG dissolvable tablet; Take 1 tablet (0.5 mg total) by mouth at bedtime as needed for anxiety.  Frequent falls  Possible causes: OA,polyneuropathy, and medications. Fall precautions discussed. PT recommended, son is going to find out if it can be provided through assisted living.  Torus palatinus  Educated about Dx, I do not think this is causing nausea.  Essential (primary) hypertension  BP closed to lower normal range. No changes in current management. DASH-low salt diet recommended. Eye exam recommended annually. F/U in 6 months, before if needed.  -     verapamil (VERELAN PM) 180 MG 24 hr capsule; Take 1 capsule (180 mg total) by mouth daily.  Peptic  esophagitis  She is not interested in PPI. This problem can be causing dysphagia and nausea. For now GERD precautions to continue. Will follow in 4 weeks.  Chronic daily headache  Hx of migraines and tension like headache. Some of her meds can aggravate problems. Could be also aggravated by anxiety and depression.  Forms for assisted living filled out and signs. B12 stopped.   Tracey Eleazer G. Martinique, MD  [facility]. [place] office.

## 2017-05-20 NOTE — Patient Instructions (Addendum)
A few things to remember from today's visit:   Chronic midline low back pain without sciatica  Anxiety state  Chronic pain syndrome  Frequent falls   Please be sure medication list is accurate. If a new problem present, please set up appointment sooner than planned today.

## 2017-05-21 ENCOUNTER — Encounter: Payer: Self-pay | Admitting: Family Medicine

## 2017-05-21 MED ORDER — VERAPAMIL HCL ER 180 MG PO CP24: 180.0000 mg | ORAL_CAPSULE | Freq: Every day | ORAL | 2 refills | Status: DC

## 2017-05-25 ENCOUNTER — Encounter: Payer: Self-pay | Admitting: Family Medicine

## 2017-05-25 DIAGNOSIS — E559 Vitamin D deficiency, unspecified: Secondary | ICD-10-CM | POA: Insufficient documentation

## 2017-05-25 DIAGNOSIS — E785 Hyperlipidemia, unspecified: Secondary | ICD-10-CM | POA: Insufficient documentation

## 2017-05-26 DIAGNOSIS — D3709 Neoplasm of uncertain behavior of other specified sites of the oral cavity: Secondary | ICD-10-CM | POA: Diagnosis not present

## 2017-05-26 DIAGNOSIS — J310 Chronic rhinitis: Secondary | ICD-10-CM | POA: Diagnosis not present

## 2017-05-26 DIAGNOSIS — M270 Developmental disorders of jaws: Secondary | ICD-10-CM | POA: Diagnosis not present

## 2017-05-27 ENCOUNTER — Ambulatory Visit: Payer: Medicare Other | Admitting: Family Medicine

## 2017-06-01 ENCOUNTER — Other Ambulatory Visit: Payer: Self-pay

## 2017-06-01 ENCOUNTER — Telehealth: Payer: Self-pay | Admitting: Family Medicine

## 2017-06-01 DIAGNOSIS — F411 Generalized anxiety disorder: Secondary | ICD-10-CM

## 2017-06-01 DIAGNOSIS — D480 Neoplasm of uncertain behavior of bone and articular cartilage: Secondary | ICD-10-CM | POA: Diagnosis not present

## 2017-06-01 MED ORDER — ALPRAZOLAM 0.5 MG PO TBDP: 0.5000 mg | ORAL_TABLET | Freq: Every day | ORAL | 1 refills | Status: DC | PRN

## 2017-06-01 NOTE — Telephone Encounter (Signed)
Rx re-faxed.

## 2017-06-01 NOTE — Telephone Encounter (Signed)
Need to have the Rx refaxed to [phone] it came over too dark.

## 2017-06-03 ENCOUNTER — Ambulatory Visit: Payer: Medicare Other | Admitting: Neurology

## 2017-06-04 ENCOUNTER — Telehealth: Payer: Self-pay | Admitting: Family Medicine

## 2017-06-04 NOTE — Telephone Encounter (Signed)
Pt would like to see if Dr. Martinique could prescribe Xanax for her anxiety to [facility].

## 2017-06-05 NOTE — Telephone Encounter (Signed)
Rx was already faxed into [facility] earlier this week.

## 2017-06-08 ENCOUNTER — Ambulatory Visit (INDEPENDENT_AMBULATORY_CARE_PROVIDER_SITE_OTHER): Payer: Medicare Other | Admitting: Neurology

## 2017-06-08 ENCOUNTER — Encounter: Payer: Self-pay | Admitting: Neurology

## 2017-06-08 VITALS — BP 166/81 | HR 81 | Ht 63.0 in | Wt 126.5 lb

## 2017-06-08 DIAGNOSIS — G8929 Other chronic pain: Secondary | ICD-10-CM | POA: Diagnosis not present

## 2017-06-08 DIAGNOSIS — IMO0002 Reserved for concepts with insufficient information to code with codable children: Secondary | ICD-10-CM

## 2017-06-08 DIAGNOSIS — M545 Low back pain: Secondary | ICD-10-CM

## 2017-06-08 DIAGNOSIS — G43709 Chronic migraine without aura, not intractable, without status migrainosus: Secondary | ICD-10-CM

## 2017-06-08 NOTE — Progress Notes (Signed)
PATIENT: Tracey Armstrong DOB: 06/20/1935  Chief Complaint  Patient presents with  . Migraine    She is here with her caregiver, Tracey Armstrong.  The patient has now moved to [place].  Her family was concerned she was misusing pain medications at home.  She has stopped duloxetine and gabapentin because she felt they were not helpful. She takes brand name Maxalt 4m, 0.5 tablets at a time which is eases for her pain.  She never had her repeat MRI.     HISTORICAL  Tracey Armstrong an 82years old right-handed female, accompanied by her caregiver KMaudie Armstrong seen in refer by her primary care physician Dr. WMaurice Smallfor migraine headache, initial evaluation was on Feb 26 2017.  She complains of chronic migraine headaches, getting worse over the past few years, especially since 2018, she complains of constant headache, especially at the right parietal region, concurrent with her worsening depression," I am hurting so bad, I cannot do anything"  She had a history of gastric ulcer require surgery April 2015, since then, she has frequent nausea, but could not throw up, per patient, this has made her migraine headache, and associated nausea more miserable longer-lasting. She complains that she woke up with 10 out of 10 headache, burning sensation at right parietal region,  In addition, she complains whole body achy pain, carry a diagnosis of fibromyalgia, KMaudie Mercuryhas worked with her since December 2017, she noted the patient is sedentary, depressed,  Patient stated she has tried different antidepression in the past, Celexa, Effexor, Lexapro, Cymbalta, Zoloft, none of the medicine works for her,  She had a history of small right paravertebral region meningioma, we have personally reviewed MRI of the brain without contrast in July 2014, has been fairly stable,  We have personally reviewed CT myelogram in 2017, CT lumbar showed multilevel degenerative disc disease, most severe at L4-5, no evidence of  significant canal stenosis, possible L4 nerve roots compression, MRI of the thoracic, no significant canal or foraminal stenosis, but there was no significant canal or foraminal stenosis.  UPDATE June 08 2017: Tracey Armstrong with her once a week, [facility],  she has fell multiple times at home, she attributed it to polypharmacy treatment.  Her primary care is now Dr. JMartinique   She  complains of chronic migraine, she has tried BOTOX in the past, did not help much,  She complain of headache almost daily, maxalt prn was helpful, MRI was ordered, but was cancelled.  She has constant right parietal area pain for years.  She has tried different over counter medications, did not help.  Her assistant living gave her medications now, she wants to try Botox as migraine prevention  REVIEW OF SYSTEMS: Full 14 system review of systems performed and notable only for as above  ALLERGIES: Allergies  Allergen Reactions  . Dihydrotachysterol     Other reaction(s): Other (See Comments)  . Trazodone And Nefazodone Other (See Comments)    Worsens headaches  . Valium [Diazepam] Other (See Comments)    headaches  . Vitamin D Analogs Other (See Comments)  . Other     "States all antidepressants cause migraines."    HOME MEDICATIONS: Current Outpatient Prescriptions  Medication Sig Dispense Refill  . ALPRAZolam (NIRAVAM) 0.5 MG dissolvable tablet Take 1 tablet (0.5 mg total) by mouth daily as needed for anxiety. 30 tablet 1  . Magnesium 250 MG TABS Take 250 mg  by mouth daily. 2 PO DAILY    . MAXALT 10 MG tablet Take 10 mg by mouth as needed for migraine. May repeat in 2 hours if needed.  Pt requires name brand.    . meloxicam (MOBIC) 15 MG tablet Take 15 mg by mouth daily.  11  . Riboflavin 400 MG CAPS Take 1 capsule (400 mg total) by mouth daily.    Marland Kitchen tiZANidine (ZANAFLEX) 2 MG tablet TAKE 2 TABLETS BY MOUTH AT BEDTIME 180 tablet 3  . Turmeric 500 MG CAPS  Take 1 capsule by mouth daily.    . verapamil (VERELAN PM) 180 MG 24 hr capsule Take 1 capsule (180 mg total) by mouth daily. 90 capsule 2  . zolpidem (AMBIEN) 5 MG tablet Take 1 tablet (5 mg total) by mouth at bedtime as needed for sleep. 30 tablet 0   No current facility-administered medications for this visit.     PAST MEDICAL HISTORY: Past Medical History:  Diagnosis Date  . Anxiety   . Arthritis   . Chronic insomnia 03/30/2015  . Chronic low back pain 12/02/2016  . Depression   . GERD (gastroesophageal reflux disease)   . Hiatal hernia   . High cholesterol   . Hypertelorism   . Hypertension   . Meningioma ([place])   . Migraine     PAST SURGICAL HISTORY: Past Surgical History:  Procedure Laterality Date  . STOMACH SURGERY      FAMILY HISTORY: Family History  Problem Relation Age of Onset  . Migraines Father   . Stroke Father   . Heart Problems Mother   . Epilepsy Brother     SOCIAL HISTORY:  Social History   Social History  . Marital status: Widowed    Spouse name: N/A  . Number of children: 4  . Years of education: 12   Occupational History  . Retired    Social History Main Topics  . Smoking status: Never Smoker  . Smokeless tobacco: Never Used  . Alcohol use No  . Drug use: No  . Sexual activity: Not Currently   Other Topics Concern  . Not on file   Social History Narrative   Lives at home alone   Patient drinks 1 glass of tea daily.   Patient is right handed.     PHYSICAL EXAM   Vitals:   06/08/17 1445  BP: (!) 166/81  Pulse: 81  Weight: 126 lb 8 oz (57.4 kg)  Height: 5' 3"  (1.6 m)    Not recorded      Body mass index is 22.41 kg/m.  PHYSICAL EXAMNIATION:  Gen: NAD, conversant, well nourised, obese, well groomed                     Cardiovascular: Regular rate rhythm, no peripheral edema, warm, nontender. Eyes: Conjunctivae clear without exudates or hemorrhage Neck: Supple, no carotid bruits. Pulmonary: Clear to auscultation  bilaterally   NEUROLOGICAL EXAM:  MENTAL STATUS: Speech:    Speech is normal; fluent and spontaneous with normal comprehension.  Cognition:     Orientation to time, place and person     Normal recent and remote memory     Normal Attention span and concentration     Normal Language, naming, repeating,spontaneous speech     Fund of knowledge   CRANIAL NERVES: CN II: Visual fields are full to confrontation. Fundoscopic exam is normal with sharp discs and no vascular changes. Pupils are round equal and briskly reactive to light.  CN III, IV, VI: extraocular movement are normal. No ptosis. CN V: Facial sensation is intact to pinprick in all 3 divisions bilaterally. Corneal responses are intact.  CN VII: Face is symmetric with normal eye closure and smile. CN VIII: Hearing is normal to rubbing fingers CN IX, X: Palate elevates symmetrically. Phonation is normal. CN XI: Head turning and shoulder shrug are intact CN XII: Tongue is midline with normal movements and no atrophy.  MOTOR: There is no pronator drift of out-stretched arms. Muscle bulk and tone are normal. Muscle strength is normal.  REFLEXES: Reflexes are 2+ and symmetric at the biceps, triceps, knees, and ankles. Plantar responses are flexor.  SENSORY: Intact to light touch, pinprick, positional sensation and vibratory sensation are intact in fingers and toes.  COORDINATION: Rapid alternating movements and fine finger movements are intact. There is no dysmetria on finger-to-nose and heel-knee-shin.    GAIT/STANCE: Posture is normal. Gait is steady with normal steps, base, arm swing, and turning. Heel and toe walking are normal. Tandem gait is normal.  Romberg is absent.   DIAGNOSTIC DATA (LABS, IMAGING, TESTING) - I reviewed patient records, labs, notes, testing and imaging myself where available.   ASSESSMENT AND PLAN  Lovetta Silvestri is a 82 y.o. female    Chronic migraine headaches, persistent right parietal area  pain,  Laboratory evaluation showed normal ESR, C-reactive protein, no evidence of temporal arteritis  Continue Maxalt as needed  Chronic low back pain, left paraspinal muscle spasm  Is related to abnormal posturing, kyphosis,  Marcial Pacas, M.D. Ph.D.  [facility] [address], [place],  [address] Ph: [phone] Fax: [phone]  CC: Maurice Small, MD

## 2017-06-09 ENCOUNTER — Telehealth: Payer: Self-pay | Admitting: Neurology

## 2017-06-09 NOTE — Telephone Encounter (Signed)
Patient called office in reference to having tremors, shakes, and facial numbness.  She is wanting to be put back on Gabapentin 300mg .  She is currently taking tiZANidine (ZANAFLEX) 2 MG tablet and would like to see about the medication being refilled.  Tried to collect as much information as clearly as I could, but patient was uncertain.  She believes these are the 2 medications.  Pharmacy- [facility] [address]  Please call

## 2017-06-09 NOTE — Telephone Encounter (Signed)
Patient called office to see if Dr. Krista Blue has been prescribing patient with the new drugs for migraines advertised on TV?  She isn't sure the name of these drugs, but would like to consider these. Pt forgot to discuss with Dr. Krista Blue at [place] visit.  Please call

## 2017-06-09 NOTE — Telephone Encounter (Signed)
Patient called office to see if Dr. Krista Blue has been prescribing patient with the new drugs for migraines advertised on TV?  She isn't sure the name of these drugs, but would like to consider these. Pt forgot to discuss with Dr. Krista Blue at [place] visit.  Please call

## 2017-06-09 NOTE — Telephone Encounter (Signed)
Please see other telephone encounter (duplicate).

## 2017-06-09 NOTE — Telephone Encounter (Signed)
Pt returned RN's call. Said she does not know how to work VM on her cell phone. She will keep her cell phone with her. Patient is very confusing to talk with. She will not be available after 5 today

## 2017-06-09 NOTE — Telephone Encounter (Signed)
Returned call to patient - stated she is just going to continue taking rizatriptan and tizanidine prn.  She has refills for both medications at the pharmacy.

## 2017-06-09 NOTE — Telephone Encounter (Signed)
Left message for a return call

## 2017-06-10 ENCOUNTER — Ambulatory Visit (INDEPENDENT_AMBULATORY_CARE_PROVIDER_SITE_OTHER): Payer: Medicare Other | Admitting: Family Medicine

## 2017-06-10 ENCOUNTER — Encounter: Payer: Self-pay | Admitting: Family Medicine

## 2017-06-10 VITALS — BP 110/80 | HR 85 | Temp 98.4°F | Wt 127.4 lb

## 2017-06-10 DIAGNOSIS — R300 Dysuria: Secondary | ICD-10-CM | POA: Diagnosis not present

## 2017-06-10 DIAGNOSIS — H04123 Dry eye syndrome of bilateral lacrimal glands: Secondary | ICD-10-CM | POA: Diagnosis not present

## 2017-06-10 DIAGNOSIS — H524 Presbyopia: Secondary | ICD-10-CM | POA: Diagnosis not present

## 2017-06-10 DIAGNOSIS — H2513 Age-related nuclear cataract, bilateral: Secondary | ICD-10-CM | POA: Diagnosis not present

## 2017-06-10 DIAGNOSIS — H43813 Vitreous degeneration, bilateral: Secondary | ICD-10-CM | POA: Diagnosis not present

## 2017-06-10 LAB — POCT URINALYSIS DIPSTICK
Bilirubin, UA: NEGATIVE
Blood, UA: NEGATIVE
Glucose, UA: NEGATIVE
Ketones, UA: NEGATIVE
Leukocytes, UA: NEGATIVE
Nitrite, UA: NEGATIVE
PROTEIN UA: NEGATIVE
UROBILINOGEN UA: 1 U/dL
pH, UA: 5 (ref 5.0–8.0)

## 2017-06-10 MED ORDER — CEPHALEXIN 500 MG PO CAPS: 500.0000 mg | ORAL_CAPSULE | Freq: Three times a day (TID) | ORAL | 0 refills | Status: DC

## 2017-06-10 NOTE — Patient Instructions (Signed)
Stay well hydrated Follow up with Dr Martinique if symptoms persist.

## 2017-06-10 NOTE — Progress Notes (Signed)
Subjective:     Patient ID: Tracey Armstrong, female   DOB: [birth year], 82 y.o.   MRN: [id number]  HPI Patient seen as a work in with about 2 week history of dysuria. She complains of intermittent burning with urination and urine urgency. She denies any recent UTI. She does not drink lot of caffeine and no alcohol. Denies any fevers or chills. No gross hematuria. No flank pain. Took some Azo couple days ago and symptoms are slightly improved today. Still having some urgency at night.  Past Medical History:  Diagnosis Date  . Anxiety   . Arthritis   . Chronic insomnia 03/30/2015  . Chronic low back pain 12/02/2016  . Depression   . GERD (gastroesophageal reflux disease)   . Hiatal hernia   . High cholesterol   . Hypertelorism   . Hypertension   . Meningioma ([place])   . Migraine    Past Surgical History:  Procedure Laterality Date  . STOMACH SURGERY      reports that she has never smoked. She has never used smokeless tobacco. She reports that she does not drink alcohol or use drugs. family history includes Epilepsy in her brother; Heart Problems in her mother; Migraines in her father; Stroke in her father. Allergies  Allergen Reactions  . Dihydrotachysterol     Other reaction(s): Other (See Comments)  . Trazodone And Nefazodone Other (See Comments)    Worsens headaches  . Valium [Diazepam] Other (See Comments)    headaches  . Vitamin D Analogs Other (See Comments)  . Other     "States all antidepressants cause migraines."     Review of Systems  Constitutional: Negative for chills and fever.  Gastrointestinal: Negative for abdominal pain.  Genitourinary: Positive for dysuria, frequency and urgency. Negative for flank pain and hematuria.       Objective:   Physical Exam  Constitutional: She appears well-developed and well-nourished.  HENT:  Mouth/Throat: Oropharynx is clear and moist.  Cardiovascular: Normal rate and regular rhythm.   Pulmonary/Chest: Effort normal and breath  sounds normal. No respiratory distress. She has no wheezes. She has no rales.  Neurological: She is alert.       Assessment:     Dysuria. Patient has normal dipstick at this time. Question resolved UTI. She describes urine urgency of recent/acute onset    Plan:     -We explained likelihood of infection is very low. Patient insisted on antibiotic. We agreed to Keflex 500 mg 3 times a day for 5 days but if symptoms persist needs follow-up with primary to further assess -minimize caffeine use -May need to address possible medications for urinary urgency if symptoms persist  Eulas Post MD [facility]

## 2017-06-15 NOTE — Progress Notes (Signed)
HPI:   Tracey Armstrong is a 82 y.o. female, who is here today with her caregiver and friend to follow on recent OV.   She was seen on 05/20/17. She is living at [facility], assisted living.  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 She was also seen for acute visit on 06/10/17, dysuria.  She has Hx of chronic pain, migraines, OA,anxiety, and insomnia.   Anxiety and depression: 20+ years.  She is currently on Alprazolam 0.5 mg daily as needed, dose was decreased gradually from 4 mg daily by former PCP. Since 2000. She states that she needs to take Xanax more frequent, so would like another Rx.  She has tried different SSRI and SNRI but according to pt, these medications have not been well tolerated, aggravated headaches.  Insomnia:  Ambien decreased from 10 mg to 5 mg at bedtime, planning on weaning off. In general she reports that she is sleeping well.   HTN: BP readings: She has it check but not sure about numbers but usually "good." She is currently on Verapamil 180 mg daily.  Denies chest pain, dyspnea, palpitation, claudication, focal weakness, or edema.  Vit D deficiency: She is currently on Ergocalciferol 50,000 U q 2 weeks. 25 OH Vit D in 03/2017 was 37.46.  She has followed with urologists at age 39 for similar symptoms and found out that "left kidney was on her bladder" Urine frequent, weak stream, and burning sensation intermittent for years. She completed Rx for Keflex, which helped with urinary symptoms but she would lie to continue abx because it helped with headache.  She is also complaining about how her medications have been administer at [facility], she think she is not receiving some medications. + Nausea, chronic. Hx of GERD but has refused Prevacid. She thinks when she was on PPI her nausea was better.  C/O severe pain, requesting referral to pain management. Last OV she  refused referral. Tomorrow she has an appt with neurosurgeon because right lower back pain radiated to her right hip, for years. Pain is severe,states that she cannot do anything because of the pain. She did not take Mobic because she was afraid of side effects.   She would like to resume Gabapentin, which she did not feel like it was helping. She was taking gabapentin 300 mg tid, she would like lower dose.   Review of Systems  Constitutional: Positive for fatigue. Negative for activity change, appetite change and fever.  HENT: Negative for mouth sores and nosebleeds.   Eyes: Negative for redness and visual disturbance.  Respiratory: Negative for cough, shortness of breath and wheezing.   Cardiovascular: Negative for chest pain, palpitations and leg swelling.  Gastrointestinal: Positive for nausea. Negative for abdominal pain and vomiting.       Negative for changes in bowel habits.  Endocrine: Negative for cold intolerance and heat intolerance.  Genitourinary: Negative for decreased urine volume, difficulty urinating, dysuria and hematuria.  Musculoskeletal: Positive for arthralgias, back pain, gait problem and neck pain.  Skin: Negative for pallor and rash.  Neurological: Positive for headaches. Negative for syncope and weakness.  Psychiatric/Behavioral: Positive for sleep disturbance. Negative for confusion and hallucinations. The patient is nervous/anxious.       Current Outpatient Prescriptions on File Prior to Visit  Medication Sig Dispense Refill  . ALPRAZolam (NIRAVAM) 0.5 MG dissolvable tablet Take 1 tablet (0.5 mg total) by mouth daily  as needed for anxiety. 30 tablet 1  . Magnesium 250 MG TABS Take 250 mg by mouth daily. 2 PO DAILY    . MAXALT 10 MG tablet Take 10 mg by mouth as needed for migraine. May repeat in 2 hours if needed.  Pt requires name brand.    . Riboflavin 400 MG CAPS Take 1 capsule (400 mg total) by mouth daily.    . Turmeric 500 MG CAPS Take 1 capsule by  mouth daily.    . verapamil (VERELAN PM) 180 MG 24 hr capsule Take 1 capsule (180 mg total) by mouth daily. 90 capsule 2   No current facility-administered medications on file prior to visit.      Past Medical History:  Diagnosis Date  . Anxiety   . Arthritis   . Chronic insomnia 03/30/2015  . Chronic low back pain 12/02/2016  . Depression   . GERD (gastroesophageal reflux disease)   . Hiatal hernia   . High cholesterol   . Hypertelorism   . Hypertension   . Meningioma ([place])   . Migraine    Allergies  Allergen Reactions  . Dihydrotachysterol     Other reaction(s): Other (See Comments)  . Trazodone And Nefazodone Other (See Comments)    Worsens headaches  . Valium [Diazepam] Other (See Comments)    headaches  . Other     "States all antidepressants cause migraines."    Social History   Social History  . Marital status: Widowed    Spouse name: N/A  . Number of children: 4  . Years of education: 12   Occupational History  . Retired    Social History Main Topics  . Smoking status: Never Smoker  . Smokeless tobacco: Never Used  . Alcohol use No  . Drug use: No  . Sexual activity: Not Currently   Other Topics Concern  . None   Social History Narrative   Lives at home alone   Patient drinks 1 glass of tea daily.   Patient is right handed.    Vitals:   06/16/17 1020  BP: 110/70  Pulse: 88  Resp: 12  SpO2: 98%   Body mass index is 22.74 kg/m.   Physical Exam  Nursing note and vitals reviewed. Constitutional: She is oriented to person, place, and time. She appears well-developed and well-nourished. No distress.  HENT:  Head: Normocephalic and atraumatic.  Mouth/Throat: Oropharynx is clear and moist and mucous membranes are normal.  Eyes: Pupils are equal, round, and reactive to light. Conjunctivae are normal.  Cardiovascular: Normal rate and regular rhythm.   No murmur heard. Pulses:      Dorsalis pedis pulses are 2+ on the right side, and 2+ on  the left side.  Respiratory: Effort normal and breath sounds normal. No respiratory distress.  GI: Soft. She exhibits no mass. There is no hepatomegaly. There is no tenderness.  Musculoskeletal: She exhibits no edema.  Kyphosis. Pain upon palpation of lumbar paraspinal muscles right side, also pain on SI joint.  Knee crepitus R>L. Hip flexion no significant limitation, mild right pain elicited. Antalgic gait.   Lymphadenopathy:    She has no cervical adenopathy.  Neurological: She is alert and oriented to person, place, and time. She has normal strength. Coordination normal.  Stable gait assisted with a cane.  Skin: Skin is warm. No rash noted. No erythema.  Psychiatric: Her mood appears anxious.  Well groomed, good eye contact.      ASSESSMENT AND PLAN:  Tracey Armstrong was seen today for follow-up.  Diagnoses and all orders for this visit:  Gastroesophageal reflux disease, esophagitis presence not specified  She agrees with resuming Prevacid 15 mg 30 min before breakfast. GERD precautions discussed.  -     lansoprazole (PREVACID) 15 MG capsule; 1 tab at 7:00 am (30 min before breakfast).  Essential (primary) hypertension  Adequately controlled. No changes in current management. Continue monitoring BP. F/U in 5-6 months, before if needed.  Vitamin D deficiency  Stable. No changes in current management. F/U in 4-6 months.  -     Vitamin D, Ergocalciferol, (DRISDOL) 50000 units CAPS capsule; 1 cap every 2 weeks.  Generalized anxiety disorder  Not well controlled. Continue Xanax 0.5 mg daily s needed. I explained that I will not increase dose, side effects discussed. She does not want to try any anxiolytic medication, has not tolerated them in the past, worsened headache.  Chronic insomnia  Will continue weaning off Ambien. Start Doxepin 10 mg at bedtime,some side effects discussed. F/U in 4 weeks.  -     doxepin (SINEQUAN) 10 MG capsule; Take 1 capsule (10 mg  total) by mouth at bedtime. -     zolpidem (AMBIEN) 5 MG tablet; Take 0.5 tablets (2.5 mg total) by mouth at bedtime as needed for sleep.  Chronic pain disorder  I agree with referral to pain clinic, at the end of her visit she asked me to hold on referral until she sees neurosurgeon tomorrow.  Chronic daily headache  She will continue following with neurologists. Explained that abx is not recommended for headache treatment, she can discuss options with Dr Krista Blue.  Chronic low back pain, unspecified back pain laterality, with sciatica presence unspecified  Gabapentin resumed but just at night. Some side effects discussed. Fall precautions.  -     tiZANidine (ZANAFLEX) 2 MG tablet; Take 1 tablet (2 mg total) by mouth 2 (two) times daily. -     gabapentin (NEURONTIN) 300 MG capsule; Take 1 capsule (300 mg total) by mouth at bedtime.   OV from 10:38 am to 11:34 am. > 40 min face to face OV. > 50% was dedicated to discussion of Dx, prognosis of some above, treatment options (anxiety), and side effects of medications. She had list of requests, medications she did not think she was receiving. We reviewed documentation from [facility], she has received meds as she is supposed to. I clearly explained I will not increase Xanax, I will not prescribed opioid medications for pain management, and I will not fill medications for headache.She needs to continue following with neurologists.       [name]. Martinique, MD  [facility]. [place] office.

## 2017-06-16 ENCOUNTER — Ambulatory Visit (INDEPENDENT_AMBULATORY_CARE_PROVIDER_SITE_OTHER): Payer: Medicare Other | Admitting: Family Medicine

## 2017-06-16 ENCOUNTER — Encounter: Payer: Self-pay | Admitting: Family Medicine

## 2017-06-16 VITALS — BP 110/70 | HR 88 | Resp 12 | Ht 63.0 in | Wt 128.4 lb

## 2017-06-16 DIAGNOSIS — G8929 Other chronic pain: Secondary | ICD-10-CM | POA: Diagnosis not present

## 2017-06-16 DIAGNOSIS — R519 Headache, unspecified: Secondary | ICD-10-CM

## 2017-06-16 DIAGNOSIS — F5104 Psychophysiologic insomnia: Secondary | ICD-10-CM

## 2017-06-16 DIAGNOSIS — E559 Vitamin D deficiency, unspecified: Secondary | ICD-10-CM | POA: Diagnosis not present

## 2017-06-16 DIAGNOSIS — K219 Gastro-esophageal reflux disease without esophagitis: Secondary | ICD-10-CM | POA: Diagnosis not present

## 2017-06-16 DIAGNOSIS — I10 Essential (primary) hypertension: Secondary | ICD-10-CM | POA: Diagnosis not present

## 2017-06-16 DIAGNOSIS — F411 Generalized anxiety disorder: Secondary | ICD-10-CM | POA: Diagnosis not present

## 2017-06-16 DIAGNOSIS — G894 Chronic pain syndrome: Secondary | ICD-10-CM | POA: Diagnosis not present

## 2017-06-16 DIAGNOSIS — R51 Headache: Secondary | ICD-10-CM

## 2017-06-16 DIAGNOSIS — M545 Low back pain: Secondary | ICD-10-CM | POA: Diagnosis not present

## 2017-06-16 MED ORDER — TIZANIDINE HCL 2 MG PO TABS: 2.0000 mg | ORAL_TABLET | Freq: Two times a day (BID) | ORAL | 1 refills | Status: DC

## 2017-06-16 MED ORDER — ZOLPIDEM TARTRATE 5 MG PO TABS: 2.5000 mg | ORAL_TABLET | Freq: Every evening | ORAL | 0 refills | Status: DC | PRN

## 2017-06-16 MED ORDER — GABAPENTIN 300 MG PO CAPS: 300.0000 mg | ORAL_CAPSULE | Freq: Every day | ORAL | 1 refills | Status: DC

## 2017-06-16 MED ORDER — LANSOPRAZOLE 15 MG PO CPDR: DELAYED_RELEASE_CAPSULE | ORAL | 1 refills | Status: DC

## 2017-06-16 MED ORDER — DOXEPIN HCL 10 MG PO CAPS: 10.0000 mg | ORAL_CAPSULE | Freq: Every day | ORAL | 0 refills | Status: DC

## 2017-06-16 MED ORDER — VITAMIN D (ERGOCALCIFEROL) 1.25 MG (50000 UNIT) PO CAPS: ORAL_CAPSULE | ORAL | 3 refills | Status: DC

## 2017-06-16 NOTE — Patient Instructions (Addendum)
A few things to remember from today's visit:   Essential (primary) hypertension  Vitamin D deficiency - Plan: Vitamin D, Ergocalciferol, (DRISDOL) 50000 units CAPS capsule  Generalized anxiety disorder  Gastroesophageal reflux disease, esophagitis presence not specified - Plan: lansoprazole (PREVACID) 15 MG capsule  Chronic insomnia - Plan: tiZANidine (ZANAFLEX) 2 MG tablet, gabapentin (NEURONTIN) 300 MG capsule, zolpidem (AMBIEN) 5 MG tablet  Chronic pain disorder  Ambien decreased from 5 mg to 2.5 mg (1/2 tab) at bedtime for a week then every other day for a week then stop.  Doxepin 10 mg at bedtime for insomnia. Fall precautions.    Please be sure medication list is accurate. If a new problem present, please set up appointment sooner than planned today.

## 2017-06-17 ENCOUNTER — Telehealth: Payer: Self-pay | Admitting: Family Medicine

## 2017-06-17 ENCOUNTER — Ambulatory Visit: Payer: Medicare Other | Admitting: Family Medicine

## 2017-06-17 DIAGNOSIS — M5441 Lumbago with sciatica, right side: Secondary | ICD-10-CM | POA: Diagnosis not present

## 2017-06-17 NOTE — Telephone Encounter (Signed)
Okay to have night time medications be given to patient at 7:30 & 8?

## 2017-06-17 NOTE — Telephone Encounter (Signed)
It is Ok to give mediations at 7:30 pm as requested by pt if this can be accommodated.  Thanks, BJ

## 2017-06-17 NOTE — Telephone Encounter (Signed)
Pt would like for you to please ask [facility] to bring pts to have all night medication including sleep medication to be given at 7:30 and 8:00 pts bedtime is at 8:00 or there about   [phone] (F)

## 2017-06-18 NOTE — Telephone Encounter (Signed)
Pt would like to have a new Rx for MAXALT -MLT 10 MG #12 prescription mailed to [facility] @ [facility] [address] [place], [place] [address].  Pt is out of Rx and state that the PCP always prescribe this medication and the neurologist will only do Botox for migraines.  Pt is aware that Dr. Martinique is out of the office.

## 2017-06-19 ENCOUNTER — Telehealth: Payer: Self-pay | Admitting: Family Medicine

## 2017-06-19 DIAGNOSIS — E559 Vitamin D deficiency, unspecified: Secondary | ICD-10-CM

## 2017-06-19 MED ORDER — VITAMIN D (ERGOCALCIFEROL) 1.25 MG (50000 UNIT) PO CAPS: ORAL_CAPSULE | ORAL | 3 refills | Status: DC

## 2017-06-19 NOTE — Telephone Encounter (Signed)
She needs to contact her neurologists' office as we discussed during [place].  Thanks, BJ

## 2017-06-19 NOTE — Telephone Encounter (Signed)
Okay to refill Maxalt?

## 2017-06-19 NOTE — Telephone Encounter (Signed)
Pt would like you to resend the Vitamin D, Ergocalciferol, (DRISDOL) 50000 units CAPS capsule  To [facility] [address].  Please make this her primary pharmacy. Pt did not know, but [facility] [address] [facility] deals with.  When any rx comes in, they will call them for pickup. Please put a note pt lives at [facility]. [facility]. Thanks!

## 2017-06-19 NOTE — Telephone Encounter (Signed)
Rx sent in, pharmacy updated, and note placed in Rx letting them know she lives at [facility].

## 2017-06-23 ENCOUNTER — Encounter: Payer: Self-pay | Admitting: *Deleted

## 2017-06-23 NOTE — Telephone Encounter (Signed)
Pt called today she said she has a new PCP/Dr [name]. She called and asked for a refill for maxalt but was told she will have to be seen 1st. She was wanting staff to call PCP to let her know Dr Krista Blue does not prescribe maxalt. I told her if this PCP has not evaluated her for HA's then she would need to see her 1st and then she could refill medication and she should keep her appt on 06/26/17. (Please PCP note dated 06/17/17).   FYI

## 2017-06-23 NOTE — Telephone Encounter (Signed)
Letter faxed over to [facility] for patient to receive her night time medications at 7:30 and 8:00 pm. Patient has made an appointment for 9/7 to discuss the Maxalt.

## 2017-06-26 ENCOUNTER — Ambulatory Visit: Payer: Medicare Other | Admitting: Family Medicine

## 2017-06-28 NOTE — Progress Notes (Signed)
HPI:   [name] is a 82 y.o. female, who is here today with her caregiver requesting refills for Maxalt. She follows with Dr Tracey Armstrong, who she last seen on 06/08/17. Last OV, 06/16/17, I instructed her to call Dr Tracey Armstrong office if she needed refills on migraine/headache medications.  Maxalt 10 mg was last filled "a while" ago.According to patient, she was told she needed a prescription from her PCP. In average she takes Maxalt 2-3 times per week, she states that she has about 12 tablets per month. She denies any side effect from medication and he helps with migraine headaches.  Next appointment with Dr. Krista Armstrong in a few days, according to patient, she wants to try Botox injections for headaches.  She also has a few questions about some of her medications, most we have already discussed during prior visits.  -She has a question about her Alprazolam, which she takes at bedtime 0.5 mg. She tells me she used to take 2 mg tablet, which she did split in 1/2 and took a few times during the day. States that she had been taking this dose for several years and she did well with no side effects.  According to patient, Alprazolam is the only medication that has helped with sleep. Alprazolam was weaned off by former PCP, during our first visit with discussed some side effects and I agree with continuing this dose, she understood I will not be increasing Alprazolam dose.  She presented related the fact that she cannot take "any antidepressant" because they aggravate headache.  Last office visit I recommended Doxepin 10 mg at bedtime , she denies any side effect. She still complains of not being able to sleep but she attributes it mainly to noise at the assisted living. She is planning on moving to another facility.  -She is also asking if she can take OTC Azor for "splashy urine." Occasional "burning sensation" with urination and chronic urinary frequency, aggravated by fluid intake. + Interrupted  urine stream for years. She denies associated abdominal pain, gross hematuria, or urine incontinence. Occasionally she has urinary urgency. She was recently treated for a UTI, she completed Keflex. Urine dipstick otherwise negative (06/10/17). She has seen urologist in the past because some issues with bladder position, she doesn't want to follow with urology for now. She mentions that she has Hx of hemorrhoids and occasionally she sees blood on tissue after defecation.  She is also concerned about a possible "infection in my head", states that every time she takes oral antibiotics headaches improve also. She denies any scalp lesion, deformity, or edema/erythema. She denies fever, chills, or MS changes, or focal deficit.  Since her last office visit, she follows with neurosurgeon for her back pain. She was prescribed Flexeril and Diclofenac. According to patient, these 2 medications are supposed to be taken for short period time. She has history of GERD, she has not noted side effects from medications. No f/u appt was arranged.  She is currently using OTC icy Hot patches, she is receiving the smaller patches and would like to change today "extra large "patches and continue using the smaller ones on her hips bid.   Review of Systems  Constitutional: Positive for fatigue. Negative for activity change, appetite change and fever.  HENT: Negative for mouth sores, nosebleeds and trouble swallowing.   Eyes: Negative for redness and visual disturbance.  Respiratory: Negative for cough, shortness of breath and wheezing.   Cardiovascular: Negative for chest pain, palpitations  and leg swelling.  Gastrointestinal: Negative for abdominal pain, nausea and vomiting.       Negative for changes in bowel habits.  Genitourinary: Positive for dysuria, frequency and urgency. Negative for decreased urine volume, flank pain, hematuria, pelvic pain and vaginal bleeding.  Musculoskeletal: Positive for arthralgias,  back pain and gait problem.  Skin: Negative for pallor and wound.  Neurological: Positive for headaches. Negative for syncope and weakness.  Psychiatric/Behavioral: Positive for sleep disturbance. Negative for confusion and suicidal ideas. The patient is nervous/anxious.       Current Outpatient Prescriptions on File Prior to Visit  Medication Sig Dispense Refill  . doxepin (SINEQUAN) 10 MG capsule Take 1 capsule (10 mg total) by mouth at bedtime. 30 capsule 0  . gabapentin (NEURONTIN) 300 MG capsule Take 1 capsule (300 mg total) by mouth at bedtime. 90 capsule 1  . lansoprazole (PREVACID) 15 MG capsule 1 tab at 7:00 am (30 min before breakfast). 90 capsule 1  . Magnesium 250 MG TABS Take 250 mg by mouth daily. 2 PO DAILY    . Riboflavin 400 MG CAPS Take 1 capsule (400 mg total) by mouth daily.    Marland Kitchen tiZANidine (ZANAFLEX) 2 MG tablet Take 1 tablet (2 mg total) by mouth 2 (two) times daily. 180 tablet 1  . Turmeric 500 MG CAPS Take 1 capsule by mouth daily.    . verapamil (VERELAN PM) 180 MG 24 hr capsule Take 1 capsule (180 mg total) by mouth daily. 90 capsule 2  . Vitamin D, Ergocalciferol, (DRISDOL) 50000 units CAPS capsule 1 cap every 2 weeks. 7 capsule 3  . zolpidem (AMBIEN) 5 MG tablet Take 0.5 tablets (2.5 mg total) by mouth at bedtime as needed for sleep. 30 tablet 0   No current facility-administered medications on file prior to visit.      Past Medical History:  Diagnosis Date  . Anxiety   . Arthritis   . Chronic insomnia 03/30/2015  . Chronic low back pain 12/02/2016  . Depression   . GERD (gastroesophageal reflux disease)   . Hiatal hernia   . High cholesterol   . Hypertelorism   . Hypertension   . Meningioma ([place])   . Migraine    Allergies  Allergen Reactions  . Dihydrotachysterol     Other reaction(s): Other (See Comments)  . Trazodone And Nefazodone Other (See Comments)    Worsens headaches  . Valium [Diazepam] Other (See Comments)    headaches  . Other      "States all antidepressants cause migraines."    Social History   Social History  . Marital status: Widowed    Spouse name: N/A  . Number of children: 4  . Years of education: 12   Occupational History  . Retired    Social History Main Topics  . Smoking status: Never Smoker  . Smokeless tobacco: Never Used  . Alcohol use No  . Drug use: No  . Sexual activity: Not Currently   Other Topics Concern  . None   Social History Narrative   Lives at home alone   Patient drinks 1 glass of tea daily.   Patient is right handed.    Vitals:   06/29/17 1157  BP: 112/80  Pulse: 90  Resp: 12  SpO2: 94%   Body mass index is 23.83 kg/m.   Physical Exam  Nursing note and vitals reviewed. Constitutional: She is oriented to person, place, and time. She appears well-developed and well-nourished. No distress.  HENT:  Head: Normocephalic and atraumatic.  Mouth/Throat: Oropharynx is clear and moist and mucous membranes are normal.  Eyes: Pupils are equal, round, and reactive to light. Conjunctivae are normal.  Cardiovascular: Normal rate and regular rhythm.   No murmur heard. Pulses:      Dorsalis pedis pulses are 2+ on the right side, and 2+ on the left side.  Respiratory: Effort normal and breath sounds normal. No respiratory distress.  GI: Soft. Bowel sounds are normal. She exhibits no distension and no mass. There is no hepatomegaly. There is tenderness in the right lower quadrant. There is no rigidity, no rebound, no guarding and no CVA tenderness.  Musculoskeletal: She exhibits no edema.       Thoracic back: She exhibits deformity (kyphosis).  Neurological: She is alert and oriented to person, place, and time. She has normal strength. Coordination normal.  Gait assisted with a cane.  Skin: Skin is warm. No erythema.  Psychiatric: Her mood appears anxious.  Well groomed, good eye contact.     ASSESSMENT AND PLAN:   Ms. Jasey was seen today for migraine.  Diagnoses and  all orders for this visit:  Chronic migraine  Continue following with neurologist. I agree with continue feeling Maxalt 10 mg, no more than 3 tablets per week. According to patient, she has not had any problems feeling 12 tablets per month, she understands amount might be limited to 9 tablets per month. She will keep appointment with Dr. Krista Armstrong later this month. Follow-up in 3-4 months. Instructed about warning signs.  -     MAXALT 10 MG tablet; Take 1 tablet (10 mg total) by mouth daily as needed for migraine. May repeat in 2 hours if needed. No more than 3 tabs per week.  Dysuria  I discussed other possible etiologies (? vaginal atrophy), according to patient, this is infrequent. She is mainly concerned about "splashy urine", which I explained can be related with overactive bladder. Further recommendations will be given according to urine results. She may need to follow with urologist if symptoms persist. I do not recommend OTC Azo to treat symptoms.  -     Culture, Urine; Future  -     Urine Microscopic Only  Chronic pain disorder  She agrees with referral to pain clinic. Icy hot patch will be changed to the "extra large" has requested but she cannot used a smaller patches at the same time. We also discussed some side effects of Diclofenac and Flexeril. She is also taking Zanaflex. Precautions with falls also discussed.  -     Ambulatory referral to Pain Clinic  Generalized anxiety disorder  We had a long discussion about treatment for anxiety and side effects of Alprazolam. I spent I will not be increasing dose of Alprazolam. Follow-up in 3 months.  -     ALPRAZolam (XANAX) 0.5 MG tablet; Take 1 tablet (0.5 mg total) by mouth at bedtime as needed for anxiety.  Chronic insomnia  She is not interested in increasing dose for Doxepin, since she is planning on moving to different facility. Ambien is being weaned off. Good sleep hygiene. She can continue Alprazolam at the  time. Follow-up in 3-4 months.   40 min face to face OV. > 50% was dedicated to discussion of Dx, prognosis, med side effects, and plan of care. I called to her attention that many of her concerns today have been discussed during prior visits. She is very anxious and extra time was necessary for reassurance and discussed of  some of her chronic medical problems. She is not reporting new symptoms/problems.    Tracey Morson G. Martinique, MD  [facility]. [place] office.

## 2017-06-29 ENCOUNTER — Encounter: Payer: Self-pay | Admitting: Family Medicine

## 2017-06-29 ENCOUNTER — Ambulatory Visit (INDEPENDENT_AMBULATORY_CARE_PROVIDER_SITE_OTHER): Payer: Medicare Other | Admitting: Family Medicine

## 2017-06-29 VITALS — BP 112/80 | HR 90 | Resp 12 | Ht 63.0 in | Wt 134.5 lb

## 2017-06-29 DIAGNOSIS — F5104 Psychophysiologic insomnia: Secondary | ICD-10-CM

## 2017-06-29 DIAGNOSIS — F411 Generalized anxiety disorder: Secondary | ICD-10-CM

## 2017-06-29 DIAGNOSIS — G43709 Chronic migraine without aura, not intractable, without status migrainosus: Secondary | ICD-10-CM

## 2017-06-29 DIAGNOSIS — R300 Dysuria: Secondary | ICD-10-CM | POA: Diagnosis not present

## 2017-06-29 DIAGNOSIS — G894 Chronic pain syndrome: Secondary | ICD-10-CM | POA: Diagnosis not present

## 2017-06-29 DIAGNOSIS — IMO0002 Reserved for concepts with insufficient information to code with codable children: Secondary | ICD-10-CM

## 2017-06-29 LAB — URINALYSIS, MICROSCOPIC ONLY

## 2017-06-29 MED ORDER — MAXALT 10 MG PO TABS: 10.0000 mg | ORAL_TABLET | Freq: Every day | ORAL | 3 refills | Status: DC | PRN

## 2017-06-29 MED ORDER — ALPRAZOLAM 0.5 MG PO TABS
0.5000 mg | ORAL_TABLET | Freq: Every evening | ORAL | 1 refills | Status: DC | PRN
Start: 2017-06-29 — End: 2017-07-27

## 2017-06-29 MED ORDER — MAXALT-MLT 10 MG PO TBDP: 10.0000 mg | ORAL_TABLET | ORAL | 3 refills | Status: DC | PRN

## 2017-06-29 NOTE — Patient Instructions (Addendum)
A few things to remember from today's visit:   Chronic migraine - Plan: MAXALT 10 MG tablet  Dysuria - Plan: Urine Microscopic Only, Culture, Urine  Chronic pain disorder - Plan: Ambulatory referral to Pain Clinic  Maxalt no more than 3 tabs per week. Keep appt with neuro for headache.    Please be sure medication list is accurate. If a new problem present, please set up appointment sooner than planned today.

## 2017-06-29 NOTE — Addendum Note (Signed)
Addended by: Martinique, [name] on: 06/29/2017 02:08 PM   Modules accepted: Orders

## 2017-06-30 DIAGNOSIS — H2513 Age-related nuclear cataract, bilateral: Secondary | ICD-10-CM | POA: Diagnosis not present

## 2017-06-30 LAB — URINE CULTURE
MICRO NUMBER: 80993771
RESULT: NO GROWTH
SPECIMEN QUALITY: ADEQUATE

## 2017-07-13 ENCOUNTER — Telehealth: Payer: Self-pay | Admitting: Family Medicine

## 2017-07-13 NOTE — Telephone Encounter (Signed)
Papers refaxed.

## 2017-07-13 NOTE — Telephone Encounter (Signed)
Candis from [facility] is calling needing the prescription refaxed to [phone] due to it being to dark unable to read.

## 2017-07-15 ENCOUNTER — Ambulatory Visit: Payer: Medicare Other | Admitting: Neurology

## 2017-07-15 DIAGNOSIS — H2512 Age-related nuclear cataract, left eye: Secondary | ICD-10-CM | POA: Diagnosis not present

## 2017-07-15 DIAGNOSIS — H25812 Combined forms of age-related cataract, left eye: Secondary | ICD-10-CM | POA: Diagnosis not present

## 2017-07-17 ENCOUNTER — Telehealth: Payer: Self-pay | Admitting: Family Medicine

## 2017-07-17 NOTE — Telephone Encounter (Signed)
They need to contact the provider that did the patient's surgery.

## 2017-07-17 NOTE — Telephone Encounter (Signed)
[facility] is calling wanting to know it the direction for the eye drops are the same as before the surgery?

## 2017-07-17 NOTE — Telephone Encounter (Signed)
I called and spoke with [facility], they have contacted patient's eye doctor to get the directions on the eye drops. Nothing further needed.

## 2017-07-17 NOTE — Telephone Encounter (Signed)
Anda Kraft at [facility] need to speak with Dr. Doug Sou nurse

## 2017-07-26 NOTE — Progress Notes (Signed)
HPI:   Tracey Armstrong is a 82 y.o. female, who is here today to follow on insomnia, she also has other concerns today.  Hx of chronic pain, last OV she was referred to pain management.She states that she did not want to arrange appt yet, would like to wait until she receivers from cataract surgery. Back pain has improved,it is mild now, it does not limit daily activities. She is not following with ortho either, she completed short treatment with Diclofenac and Flexeril. She states that "I have convinced myself" that her chronic back pain is caused by OA.  She is on Acetaminophen 500 mg qid as needed, Icy Hot patch bid, and Zanaflex 2 mg bid. She would like to increase Zanaflex to 4 mg bid. She denies side effects. She is also on Gabapentin 300 mg at bedtime, which helps with right sciatic pain. Anxiety, she is on Alprazolam 0.5 mg at bedtime as needed. She is again asking to increase dose of Alprazolam, she denies side effects.  She also mentions that she feels like her memory has improved and in general she is feeling better,so she is planning on requesting transfer to independent living.    Insomnia: Doxepin 10 mg started about 4 weeks ago. She does not feel like it is making a big difference in her sleep. She states that noise through the nigh aggravates problem. She goes to bed around 8 Pm, wakes up around 11 pm to 12 midnight,cannot sleep for about 3 hours, waking up around 6:45 pm to take her Prevacid.  She feels like increasing her Alprazolam will help.  Vit D deficiency: She was also complaining about not receiving Vit D supplementation until recently. She would like Vit D check today.  25 OH vit D on 02/26/17 was 33.5.  She also would like to go through list of her med list at [facility] .   Some of her medications have been prescribed by her ophthalmologist.  She is also requesting authorization to use Flonase nasal spray for rhinitis and nasal congestion. She has  not had fever,chills, or recent sick contact.   Review of Systems  Constitutional: Positive for fatigue. Negative for appetite change and fever.  HENT: Positive for congestion, postnasal drip and rhinorrhea. Negative for mouth sores, nosebleeds, sore throat and trouble swallowing.   Eyes: Negative for redness and visual disturbance.  Respiratory: Negative for cough, shortness of breath and wheezing.   Cardiovascular: Negative for chest pain, palpitations and leg swelling.  Gastrointestinal: Negative for abdominal pain, nausea and vomiting.       Negative for changes in bowel habits.  Genitourinary: Negative for decreased urine volume, dysuria and hematuria.  Musculoskeletal: Positive for arthralgias and back pain.  Skin: Negative for rash and wound.  Allergic/Immunologic: Positive for environmental allergies.  Neurological: Positive for headaches. Negative for syncope and weakness.  Psychiatric/Behavioral: Positive for sleep disturbance. Negative for confusion. The patient is nervous/anxious.       Current Outpatient Prescriptions on File Prior to Visit  Medication Sig Dispense Refill  . gabapentin (NEURONTIN) 300 MG capsule Take 1 capsule (300 mg total) by mouth at bedtime. 90 capsule 1  . lansoprazole (PREVACID) 15 MG capsule 1 tab at 7:00 am (30 min before breakfast). 90 capsule 1  . Magnesium 250 MG TABS Take 250 mg by mouth daily. 2 PO DAILY    . MAXALT-MLT 10 MG disintegrating tablet Take 1 tablet (10 mg total) by mouth as needed for migraine. No more  than 3 tabs per week. 12 tablet 3  . Riboflavin 400 MG CAPS Take 1 capsule (400 mg total) by mouth daily.    . Turmeric 500 MG CAPS Take 1 capsule by mouth daily.    . verapamil (VERELAN PM) 180 MG 24 hr capsule Take 1 capsule (180 mg total) by mouth daily. 90 capsule 2  . Vitamin D, Ergocalciferol, (DRISDOL) 50000 units CAPS capsule 1 cap every 2 weeks. 7 capsule 3   No current facility-administered medications on file prior to  visit.      Past Medical History:  Diagnosis Date  . Anxiety   . Arthritis   . Chronic insomnia 03/30/2015  . Chronic low back pain 12/02/2016  . Depression   . GERD (gastroesophageal reflux disease)   . Hiatal hernia   . High cholesterol   . Hypertelorism   . Hypertension   . Meningioma ([place])   . Migraine    Allergies  Allergen Reactions  . Dihydrotachysterol     Other reaction(s): Other (See Comments)  . Trazodone And Nefazodone Other (See Comments)    Worsens headaches  . Valium [Diazepam] Other (See Comments)    headaches  . Other     "States all antidepressants cause migraines."    Social History   Social History  . Marital status: Widowed    Spouse name: N/A  . Number of children: 4  . Years of education: 12   Occupational History  . Retired    Social History Main Topics  . Smoking status: Never Smoker  . Smokeless tobacco: Never Used  . Alcohol use No  . Drug use: No  . Sexual activity: Not Currently   Other Topics Concern  . None   Social History Narrative   Lives at home alone   Patient drinks 1 glass of tea daily.   Patient is right handed.    Vitals:   07/27/17 1340  BP: 118/80  Pulse: 84  Resp: 12  SpO2: 98%   Body mass index is 24.64 kg/m.   Physical Exam  Nursing note and vitals reviewed. Constitutional: She is oriented to person, place, and time. She appears well-developed and well-nourished. No distress.  HENT:  Head: Normocephalic and atraumatic.  Mouth/Throat: Oropharynx is clear and moist and mucous membranes are normal.  Eyes: Pupils are equal, round, and reactive to light. Conjunctivae are normal.  Cardiovascular: Normal rate and regular rhythm.   No murmur heard. Pulses:      Dorsalis pedis pulses are 2+ on the right side, and 2+ on the left side.  Respiratory: Effort normal and breath sounds normal. No respiratory distress.  GI: Soft. She exhibits no mass. There is no hepatomegaly. There is no tenderness.    Musculoskeletal: She exhibits no edema or tenderness.       Thoracic back: She exhibits no tenderness and no bony tenderness.       Lumbar back: She exhibits no tenderness and no bony tenderness.  Kyphosis.  Lymphadenopathy:    She has no cervical adenopathy.  Neurological: She is alert and oriented to person, place, and time. She has normal strength. Coordination normal.  Stable gait with no assistance.  Skin: Skin is warm. No erythema.  Psychiatric: Her mood appears anxious.  Well groomed, good eye contact.      ASSESSMENT AND PLAN:    Tracey Armstrong was seen today for follow-up.  Diagnoses and all orders for this visit:  Chronic insomnia  Good sleep hygiene. Since Doxepin did  not help, she will discontinued. Continue Alprazolam at bedtime. F/U in 3 months.  -     ALPRAZolam (XANAX) 0.5 MG tablet; 1 tab at bedtime and can have 1/2-1 tab as needed but no more than 40 tabs total per month.  Chronic low back pain, unspecified back pain laterality, with sciatica presence unspecified  Improved. Continue Acetaminophen 500 mg qid as needed, Icy Hot patch. Zanaflex increased from 2 mg bid to 4 mg bid. Side effects of medication discussed.  -     tiZANidine (ZANAFLEX) 2 MG tablet; Take 2 tablets (4 mg total) by mouth 2 (two) times daily.  Vitamin D deficiency  No changes in current management, will follow labs done today and will give further recommendations accordingly. F/U in 4-6 months.  -     VITAMIN D 25 Hydroxy (Vit-D Deficiency, Fractures)  Generalized anxiety disorder  Stable, not well controlled. She has not tolerated several anxiolytic medications. She can have an extra 1/2-1 tab of Alprazolam 0.5 mg at night as needed but no more than # 40 tabs total per month. Side effects of medication discussed. F/U in 3 months,before if needed.  -     ALPRAZolam (XANAX) 0.5 MG tablet; 1 tab at bedtime and can have 1/2-1 tab as needed but no more than 40 tabs total per  month.  Allergic rhinitis, unspecified seasonality, unspecified trigger  Flonase nasal spray cha be used as needed 1 spray bid. Nasal irrigations also recommended. F/U as needed.   Forms for [facility] completed. Medications reviewed with pt.    Nolon Yellin G. Martinique, MD  [facility]. [place] office.

## 2017-07-27 ENCOUNTER — Ambulatory Visit (INDEPENDENT_AMBULATORY_CARE_PROVIDER_SITE_OTHER): Payer: Medicare Other | Admitting: Family Medicine

## 2017-07-27 ENCOUNTER — Encounter: Payer: Self-pay | Admitting: Family Medicine

## 2017-07-27 VITALS — BP 118/80 | HR 84 | Resp 12 | Ht 63.0 in | Wt 139.1 lb

## 2017-07-27 DIAGNOSIS — M545 Low back pain: Secondary | ICD-10-CM

## 2017-07-27 DIAGNOSIS — G8929 Other chronic pain: Secondary | ICD-10-CM

## 2017-07-27 DIAGNOSIS — J309 Allergic rhinitis, unspecified: Secondary | ICD-10-CM | POA: Diagnosis not present

## 2017-07-27 DIAGNOSIS — F411 Generalized anxiety disorder: Secondary | ICD-10-CM | POA: Diagnosis not present

## 2017-07-27 DIAGNOSIS — E559 Vitamin D deficiency, unspecified: Secondary | ICD-10-CM

## 2017-07-27 DIAGNOSIS — F5104 Psychophysiologic insomnia: Secondary | ICD-10-CM | POA: Diagnosis not present

## 2017-07-27 LAB — VITAMIN D 25 HYDROXY (VIT D DEFICIENCY, FRACTURES): VITD: 40.37 ng/mL (ref 30.00–100.00)

## 2017-07-27 MED ORDER — TIZANIDINE HCL 2 MG PO TABS: 4.0000 mg | ORAL_TABLET | Freq: Two times a day (BID) | ORAL | 1 refills | Status: DC

## 2017-07-27 MED ORDER — ALPRAZOLAM 0.5 MG PO TABS: ORAL_TABLET | ORAL | 1 refills | Status: DC

## 2017-07-27 NOTE — Patient Instructions (Signed)
A few things to remember from today's visit:   Chronic insomnia  Generalized anxiety disorder  Vitamin D deficiency - Plan: VITAMIN D 25 Hydroxy (Vit-D Deficiency, Fractures)  Chronic low back pain, unspecified back pain laterality, with sciatica presence unspecified  Some of you medications changed, Alprazolam and Zanaflex.    Please be sure medication list is accurate. If a new problem present, please set up appointment sooner than planned today.

## 2017-07-27 NOTE — Progress Notes (Signed)
Normal Vit D level. Continue Ergocalciferol 50,000 U q 2 weeks. Thanks

## 2017-08-11 ENCOUNTER — Other Ambulatory Visit: Payer: Self-pay

## 2017-08-11 DIAGNOSIS — E559 Vitamin D deficiency, unspecified: Secondary | ICD-10-CM

## 2017-08-11 MED ORDER — VITAMIN D (ERGOCALCIFEROL) 1.25 MG (50000 UNIT) PO CAPS: ORAL_CAPSULE | ORAL | 3 refills | Status: DC

## 2017-10-27 ENCOUNTER — Ambulatory Visit: Payer: Medicare Other | Admitting: Family Medicine

## 2017-10-27 NOTE — Progress Notes (Signed)
HPI:   [name] is a 83 y.o. female, who is here today for 3 months follow up.   She was last seen on 07/27/17. She is now in independent living at [facility].   She had cataract surgery 06/2017.   Insomnia: She is requesting refills for Ambien, which she took before and has helped. A few times she attributed insomnia to noise during the night in assisting living.  She is now living independently.Alprazolam at current dose is not helping, she would like to increase dose. In the past I recommended Doxepin, she thinks she took this medication in the past and it caused headaches.  Sleeping about 3 hours at the time, wakes up and takes her long time to go back to sleep.   Anxiety:  Currently she is on Alprazolam 0.5 mg bid as needed.   Today she is again requesting increase in dose of Alprazolam. She has not tolerated daily anxiolytic medications, they caused headaches. She has no noted side effects, denies depressed mood or suicidal thoughts.  Chronic back pain: She was referred to pain clinic but decided to hold on evaluation. She is on Zanaflex 4 mg bid. In general back pain and joint pain has been mild. She has no noted joint edema or erythema. She takes Tylenol 500 mg daily as needed.   GERD: She was c/o nausea,so Prevacid resumed. Nausea has improved with PPI.  Denies abdominal pain, vomiting, changes in bowel habits, or melena.    She is also requesting iron check because pigmentation around fingers right hand, were rings are on. She denies tenderness on affected area or erythema.  She has no noted gross hematuria or blood in the stool.  He is also complaining about pruritic rash on palms, which she noted about a month ago.  She has been using OTC Hydrocortisone and this seems to help. She has had intermittent pruritus on upper and lower back for a while now, she is not sure if she has any rash on this basis.  Hypertension: Currently she is on  Verapamil 180 mg daily. She is not checking BP at home.  Denies severe/frequent headache, visual changes, chest pain, dyspnea, palpitation, focal weakness, or edema.  Lab Results  Component Value Date   CREATININE 0.88 02/26/2017   BUN 11 02/26/2017   NA 134 02/26/2017   K 4.8 02/26/2017   CL 96 02/26/2017   CO2 23 02/26/2017     Review of Systems  Constitutional: Positive for fatigue. Negative for activity change, appetite change and fever.  HENT: Negative for mouth sores, nosebleeds and trouble swallowing.   Eyes: Negative for redness and visual disturbance.  Respiratory: Negative for cough, shortness of breath and wheezing.   Cardiovascular: Negative for chest pain, palpitations and leg swelling.  Gastrointestinal: Negative for abdominal pain, blood in stool, nausea and vomiting.       Negative for changes in bowel habits.  Genitourinary: Negative for decreased urine volume and hematuria.  Musculoskeletal: Positive for arthralgias and back pain. Negative for gait problem.  Skin: Negative for rash and wound.  Neurological: Negative for syncope, weakness and headaches.  Psychiatric/Behavioral: Positive for sleep disturbance. Negative for confusion. The patient is nervous/anxious.      Current Outpatient Medications on File Prior to Visit  Medication Sig Dispense Refill  . gabapentin (NEURONTIN) 300 MG capsule Take 1 capsule (300 mg total) by mouth at bedtime. 90 capsule 1  . lansoprazole (PREVACID) 15 MG capsule 1 tab at  7:00 am (30 min before breakfast). 90 capsule 1  . Magnesium 250 MG TABS Take 250 mg by mouth daily. 2 PO DAILY    . MAXALT-MLT 10 MG disintegrating tablet Take 1 tablet (10 mg total) by mouth as needed for migraine. No more than 3 tabs per week. 12 tablet 3  . Riboflavin 400 MG CAPS Take 1 capsule (400 mg total) by mouth daily.    . Turmeric 500 MG CAPS Take 1 capsule by mouth daily.    . Vitamin D, Ergocalciferol, (DRISDOL) 50000 units CAPS capsule 1 cap  every 2 weeks. 12 capsule 3   No current facility-administered medications on file prior to visit.      Past Medical History:  Diagnosis Date  . Anxiety   . Arthritis   . Chronic insomnia 03/30/2015  . Chronic low back pain 12/02/2016  . Depression   . GERD (gastroesophageal reflux disease)   . Hiatal hernia   . High cholesterol   . Hypertelorism   . Hypertension   . Meningioma ([place])   . Migraine    Allergies  Allergen Reactions  . Dihydrotachysterol     Other reaction(s): Other (See Comments)  . Trazodone And Nefazodone Other (See Comments)    Worsens headaches  . Valium [Diazepam] Other (See Comments)    headaches  . Citalopram     Other reaction(s): Headache  . Other     "States all antidepressants cause migraines."    Social History   Socioeconomic History  . Marital status: Widowed    Spouse name: None  . Number of children: 4  . Years of education: 36  . Highest education level: None  Social Needs  . Financial resource strain: None  . Food insecurity - worry: None  . Food insecurity - inability: None  . Transportation needs - medical: None  . Transportation needs - non-medical: None  Occupational History  . Occupation: Retired  Tobacco Use  . Smoking status: Never Smoker  . Smokeless tobacco: Never Used  Substance and Sexual Activity  . Alcohol use: No  . Drug use: No  . Sexual activity: Not Currently  Other Topics Concern  . None  Social History Narrative   Lives at home alone   Patient drinks 1 glass of tea daily.   Patient is right handed.    Vitals:   10/28/17 1153  BP: 120/60  Pulse: 88  Resp: 16  Temp: 98.5 F (36.9 C)  SpO2: 96%   Body mass index is 26.97 kg/m.   Physical Exam  Nursing note and vitals reviewed. Constitutional: She is oriented to person, place, and time. She appears well-developed and well-nourished. No distress.  HENT:  Head: Normocephalic and atraumatic.  Mouth/Throat: Oropharynx is clear and moist and  mucous membranes are normal.  Eyes: Conjunctivae are normal. Pupils are equal, round, and reactive to light.  Cardiovascular: Normal rate and regular rhythm.  No murmur heard. Pulses:      Dorsalis pedis pulses are 2+ on the right side, and 2+ on the left side.  Respiratory: Effort normal and breath sounds normal. No respiratory distress.  GI: Soft. She exhibits no mass. There is no hepatomegaly. There is no tenderness.  Musculoskeletal: She exhibits no edema or tenderness.       Thoracic back: She exhibits no tenderness and no bony tenderness.       Lumbar back: She exhibits no tenderness and no bony tenderness.  Kyphosis of thoracic spine. Shoulders with normal ROM,bilateral.  No pain elicited.  Lymphadenopathy:    She has no cervical adenopathy.  Neurological: She is alert and oriented to person, place, and time. She has normal strength. Coordination normal.  Stable gait without assistance.  Skin: Skin is warm. Rash noted. Rash is macular. No erythema.  Left palmar area, macular erythematous lesions x 2 (1 cm),mildly scaly, and no tender.  Psychiatric: Her mood appears anxious.  Well groomed, good eye contact.    ASSESSMENT AND PLAN:   [name] was seen today for 3 months follow-up.  Diagnoses and all orders for this visit:  Lab Results  Component Value Date   WBC 6.0 10/28/2017   HGB 10.9 (L) 10/28/2017   HCT 33.5 (L) 10/28/2017   MCV 89.9 10/28/2017   PLT 249.0 10/28/2017    Chronic insomnia  Recommend improving sleep hygiene. Explained that given her age I do not recommend Ambien nor increasing dose of Alprazolam. She is not interested in other pharmacologic options.  -     ALPRAZolam (XANAX) 0.5 MG tablet; 1 tab at bedtime and can have 1/2-1 tab as needed but no more than 40 tabs total per month. -     Iron  Gastroesophageal reflux disease, esophagitis presence not specified  Now she has improved with Prevacid, no changes in current management. GERD  precautions discussed. Follow-up in 6-12 months.  Generalized anxiety disorder  Stable overall but not well controlled. She is not interested in taking daily anxiolytic medication. I do not recommend increasing dose of Alprazolam. We discussed some side effects of benzodiazepines. Follow-up in 3 months.  -     ALPRAZolam (XANAX) 0.5 MG tablet; 1 tab at bedtime and can have 1/2-1 tab as needed but no more than 40 tabs total per month.  Macular erythematous rash  ? Eczema. OTC Hydrocortisone seems to help, recommend continuing twice daily. Follow-up as needed.  Iron deficiency anemia, unspecified iron deficiency anemia type  Skin pigmentation she was concerned about I was able to wipe off with alcohol swab.  Explained that this is not related with iron deficiency but she insisted in having iron check. Reviewing records I noted mild anemia on 02/26/2017. Further recommendations will be given according to lab results.    -     CBC -     Iron  Essential (primary) hypertension  Well controlled. No changes in current management. Continue low-salt/DASH diet. Eye exam current. Follow-up in 6 months.     -[name] was advised to return sooner than planned today if new concerns arise.       Tracey Armstrong G. Martinique, MD  [facility]. [place] office.

## 2017-10-28 ENCOUNTER — Other Ambulatory Visit: Payer: Self-pay | Admitting: Family Medicine

## 2017-10-28 ENCOUNTER — Ambulatory Visit (INDEPENDENT_AMBULATORY_CARE_PROVIDER_SITE_OTHER): Payer: Medicare Other | Admitting: Family Medicine

## 2017-10-28 ENCOUNTER — Encounter: Payer: Self-pay | Admitting: Family Medicine

## 2017-10-28 ENCOUNTER — Other Ambulatory Visit: Payer: Self-pay | Admitting: *Deleted

## 2017-10-28 VITALS — BP 120/60 | HR 88 | Temp 98.5°F | Resp 16 | Ht 63.0 in | Wt 152.2 lb

## 2017-10-28 DIAGNOSIS — F411 Generalized anxiety disorder: Secondary | ICD-10-CM | POA: Diagnosis not present

## 2017-10-28 DIAGNOSIS — G8929 Other chronic pain: Secondary | ICD-10-CM

## 2017-10-28 DIAGNOSIS — K219 Gastro-esophageal reflux disease without esophagitis: Secondary | ICD-10-CM | POA: Diagnosis not present

## 2017-10-28 DIAGNOSIS — L538 Other specified erythematous conditions: Secondary | ICD-10-CM | POA: Diagnosis not present

## 2017-10-28 DIAGNOSIS — M545 Low back pain: Principal | ICD-10-CM

## 2017-10-28 DIAGNOSIS — I10 Essential (primary) hypertension: Secondary | ICD-10-CM | POA: Diagnosis not present

## 2017-10-28 DIAGNOSIS — F5104 Psychophysiologic insomnia: Secondary | ICD-10-CM | POA: Diagnosis not present

## 2017-10-28 DIAGNOSIS — D509 Iron deficiency anemia, unspecified: Secondary | ICD-10-CM | POA: Diagnosis not present

## 2017-10-28 LAB — CBC
HCT: 33.5 % — ABNORMAL LOW (ref 36.0–46.0)
Hemoglobin: 10.9 g/dL — ABNORMAL LOW (ref 12.0–15.0)
MCHC: 32.6 g/dL (ref 30.0–36.0)
MCV: 89.9 fl (ref 78.0–100.0)
PLATELETS: 249 10*3/uL (ref 150.0–400.0)
RBC: 3.73 Mil/uL — AB (ref 3.87–5.11)
RDW: 13.6 % (ref 11.5–15.5)
WBC: 6 10*3/uL (ref 4.0–10.5)

## 2017-10-28 LAB — IRON: IRON: 33 ug/dL — AB (ref 42–145)

## 2017-10-28 MED ORDER — ALPRAZOLAM 0.5 MG PO TABS: ORAL_TABLET | ORAL | 1 refills | Status: DC

## 2017-10-28 MED ORDER — TIZANIDINE HCL 2 MG PO TABS: 4.0000 mg | ORAL_TABLET | Freq: Two times a day (BID) | ORAL | 1 refills | Status: DC

## 2017-10-28 MED ORDER — VERAPAMIL HCL ER 180 MG PO CP24: 180.0000 mg | ORAL_CAPSULE | Freq: Every day | ORAL | 2 refills | Status: DC

## 2017-10-28 NOTE — Patient Instructions (Addendum)
A few things to remember from today's visit:   Chronic insomnia - Plan: ALPRAZolam (XANAX) 0.5 MG tablet  Generalized anxiety disorder - Plan: ALPRAZolam (XANAX) 0.5 MG tablet  Gastroesophageal reflux disease, esophagitis presence not specified  Essential (primary) hypertension  Macular erythematous rash  No changes today. Continue over the counter Hydrocortisone.    Please be sure medication list is accurate. If a new problem present, please set up appointment sooner than planned today.

## 2017-11-06 DIAGNOSIS — H04123 Dry eye syndrome of bilateral lacrimal glands: Secondary | ICD-10-CM | POA: Diagnosis not present

## 2017-11-06 DIAGNOSIS — H01004 Unspecified blepharitis left upper eyelid: Secondary | ICD-10-CM | POA: Diagnosis not present

## 2017-11-06 DIAGNOSIS — H01001 Unspecified blepharitis right upper eyelid: Secondary | ICD-10-CM | POA: Diagnosis not present

## 2017-11-30 DIAGNOSIS — H04123 Dry eye syndrome of bilateral lacrimal glands: Secondary | ICD-10-CM | POA: Diagnosis not present

## 2018-01-05 NOTE — Progress Notes (Signed)
Subjective:   Tracey Armstrong is a 83 y.o. female who presents for Medicare Annual (Subsequent) preventive examination.  Reports health as good over all Last OV was 10/2017  Chronic insomnia - states she can't sleep  Pharmacy filled 30 pills for xanax and 40 were ordered but stated the could not fill the way it was written Wrote for .5 with 1/2 to 1 tab but no more than 40 Pharmacy filled 30 .5 tablets  She is requested 1mg  with dispence 40  States migraines are coming back (one last week and one this week)  (Basket note to Dr. Martinique)  Lives in independent living at [facility] x 6 months ago Had a bad fall in the bathroom - Twin sons found her  Her son's placed her in [place]  She is in process of selling her home  Diet BMI 28   Exercise Get a lot of walking in; she is on the 3rd floor as far as she can go. Now she walks x 2 as far because the elevator is under repair Goes to get her mail as well  Knees swelling and back pain  Uses icyhot patches for back; Arthritis Tylenol   Health Maintenance Due  Topic Date Due  . DEXA SCAN  12/23/2001   States she takes calcium and Vit D Agreed to  a dexa scan and placed order   Had her shingles (Zoster) at [facility] Discussed the new shingrix that she will take at the pharmacy  Does not take vaccines Postponed  pneumonia and flu vaccines   Cardiac Risk Factors include: advanced age (>74men, >82 women);family history of premature cardiovascular disease;hypertension     Objective:     Vitals: BP 122/80   Pulse 76   Ht 5\' 1"  (1.549 m)   Wt 149 lb (67.6 kg)   SpO2 97%   BMI 28.15 kg/m   Body mass index is 28.15 kg/m.  Advanced Directives 03/30/2015  Does Patient Have a Medical Advance Directive? Yes  Type of Paramedic of [place];Living will    Tobacco Social History   Tobacco Use  Smoking Status Never Smoker  Smokeless Tobacco Never Used     Counseling given: Yes   Clinical  Intake:    Past Medical History:  Diagnosis Date  . Anxiety   . Arthritis   . Chronic insomnia 03/30/2015  . Chronic low back pain 12/02/2016  . Depression   . GERD (gastroesophageal reflux disease)   . Hiatal hernia   . High cholesterol   . Hypertelorism   . Hypertension   . Meningioma ([place])   . Migraine    Past Surgical History:  Procedure Laterality Date  . STOMACH SURGERY     Family History  Problem Relation Age of Onset  . Migraines Father   . Stroke Father   . Heart Problems Mother   . Epilepsy Brother    Social History   Socioeconomic History  . Marital status: Widowed    Spouse name: Not on file  . Number of children: 4  . Years of education: 69  . Highest education level: Not on file  Social Needs  . Financial resource strain: Not on file  . Food insecurity - worry: Not on file  . Food insecurity - inability: Not on file  . Transportation needs - medical: Not on file  . Transportation needs - non-medical: Not on file  Occupational History  . Occupation: Retired  Tobacco Use  . Smoking status:  Never Smoker  . Smokeless tobacco: Never Used  Substance and Sexual Activity  . Alcohol use: No  . Drug use: No  . Sexual activity: Not Currently  Other Topics Concern  . Not on file  Social History Narrative   Lives at home alone   Patient drinks 1 glass of tea daily.   Patient is right handed.    Outpatient Encounter Medications as of 01/06/2018  Medication Sig  . ALPRAZolam (XANAX) 0.5 MG tablet 1 tab at bedtime and can have 1/2-1 tab as needed but no more than 40 tabs total per month.  . gabapentin (NEURONTIN) 300 MG capsule Take 1 capsule (300 mg total) by mouth at bedtime.  . lansoprazole (PREVACID) 15 MG capsule 1 tab at 7:00 am (30 min before breakfast).  . Magnesium 250 MG TABS Take 250 mg by mouth daily. 2 PO DAILY  . MAXALT-MLT 10 MG disintegrating tablet Take 1 tablet (10 mg total) by mouth as needed for migraine. No more than 3 tabs per week.    . Riboflavin 400 MG CAPS Take 1 capsule (400 mg total) by mouth daily.  Marland Kitchen tiZANidine (ZANAFLEX) 2 MG tablet TAKE 2 TABLETS BY MOUTH AT BEDTIME  . Turmeric 500 MG CAPS Take 1 capsule by mouth daily.  . verapamil (VERELAN PM) 180 MG 24 hr capsule Take 1 capsule (180 mg total) by mouth daily.  . Vitamin D, Ergocalciferol, (DRISDOL) 50000 units CAPS capsule 1 cap every 2 weeks.   No facility-administered encounter medications on file as of 01/06/2018.     Activities of Daily Living In your present state of health, do you have any difficulty performing the following activities: 01/06/2018  Hearing? N  Vision? N  Difficulty concentrating or making decisions? N  Walking or climbing stairs? N  Dressing or bathing? N  Doing errands, shopping? N  Preparing Food and eating ? N  Using the Toilet? N  In the past six months, have you accidently leaked urine? N  Do you have problems with loss of bowel control? N  Managing your Medications? N  Managing your Finances? N  Housekeeping or managing your Housekeeping? N  Some recent data might be hidden    Patient Care Team: Martinique, [name], MD as PCP - General (Family Medicine) Kristeen Miss, MD as Consulting Physician (Neurosurgery)    Assessment:   This is a routine wellness examination for Gale.  Exercise Activities and Dietary recommendations Current Exercise Habits: Home exercise routine, Time (Minutes): 30, Frequency (Times/Week): 4(walks every day at [place] ), Weekly Exercise (Minutes/Week): 120, Intensity: Moderate  Goals    . Patient Stated     Will take care of you and enjoy your new environment        Fall Risk; no more reported falls Walks without assistive device    Timed Get Up and Go performed: WNL  Depression Screen PHQ 2/9 Scores 01/06/2018  PHQ - 2 Score 1    Has some sadness with loss of home  No overt depression and states she is eating and likes where she stays. Very social   Cognitive Function MMSE -  Mini Mental State Exam 01/06/2018  Not completed: (No Data)     Ad8 score reviewed for issues:  Issues making decisions:  Less interest in hobbies / activities:  Repeats questions, stories (family complaining):  Trouble using ordinary gadgets (microwave, computer, phone):  Forgets the month or year:   Mismanaging finances:   Remembering appts:  Daily problems with thinking  and/or memory: Ad8 score is=none noted on assessment    There is no immunization history on file for this patient.  Qualifies for Shingles Vaccine- states she will get this   Screening Tests Health Maintenance  Topic Date Due  . DEXA SCAN  12/23/2001  . INFLUENZA VACCINE  09/08/2018 (Originally 05/20/2017)  . PNA vac Low Risk Adult (1 of 2 - PCV13) 01/07/2019 (Originally 12/23/2001)  . TETANUS/TDAP  08/30/2024         Plan:      PCP Notes   Health Maintenance dexa ordered at [facility] to fax order  Declined pneumonia and flu vaccines and postponed both  Colonoscopy aged out  Does not get mammograms anymore  Abnormal Screens  None  States her brother was diabetic now and she was told she was "almost diabetic" no labs values noted, but check A1c for her while here and it was 5.5.   Having issues with left eye post cataract. Will have her right eye cataract surgery when the left eye is healed   Referrals  none  Patient concerns; Basket note to Dr. Martinique regarding xanax rx recently ordered  States her .5 tab was ordered x 40 but the pharmacy would not feel 40 due to the way the script was written.  Still requesting 1 mg tabs States she migraines have increased on the .5   Nurse Concerns;  as noted   Next PCP apt Just seen for insomnia     I have personally reviewed and noted the following in the patient's chart:   . Medical and social history . Use of alcohol, tobacco or illicit drugs  . Current medications and supplements . Functional ability and status . Nutritional  status . Physical activity . Advanced directives . List of other physicians . Hospitalizations, surgeries, and ER visits in previous 12 months . Vitals . Screenings to include cognitive, depression, and falls . Referrals and appointments  In addition, I have reviewed and discussed with patient certain preventive protocols, quality metrics, and best practice recommendations. A written personalized care plan for preventive services as well as general preventive health recommendations were provided to patient.     Wynetta Fines, RN  01/06/2018

## 2018-01-06 ENCOUNTER — Ambulatory Visit (INDEPENDENT_AMBULATORY_CARE_PROVIDER_SITE_OTHER): Payer: Medicare Other

## 2018-01-06 ENCOUNTER — Telehealth: Payer: Self-pay

## 2018-01-06 VITALS — BP 122/80 | HR 76 | Ht 61.0 in | Wt 149.0 lb

## 2018-01-06 DIAGNOSIS — E2839 Other primary ovarian failure: Secondary | ICD-10-CM

## 2018-01-06 DIAGNOSIS — Z0000 Encounter for general adult medical examination without abnormal findings: Secondary | ICD-10-CM

## 2018-01-06 NOTE — Progress Notes (Signed)
I have reviewed documentation from this visit and I agree with recommendations given. Xanax 0.5 mg prescription to continue with no changes, max 40 tabs per month.  Ikram Riebe G. Martinique, MD  [facility]. [place] office.

## 2018-01-06 NOTE — Telephone Encounter (Signed)
In for AWV 3/20/   Pharmacy filled 30 pills for xanax and 40 were ordered but the pharmacy stated they could only fill 30 the way it was written  Rx written for .5 tablets       Sig: 1 tab at bedtime and can have 1/2-1 tab as needed but no more than 40 tabs total per month.   Pharmacy filled 30  She is requested 1mg  as this is what she has been on. Her migraines have increased with the .5 Xanax.   Please advise.

## 2018-01-06 NOTE — Patient Instructions (Addendum)
Tracey Armstrong , Thank you for taking time to come for your Medicare Wellness Visit. I appreciate your ongoing commitment to your health goals. Please review the following plan we discussed and let me know if I can assist you in the future.   Recommend a dexa scan; will order one at [facility] will discuss Xanax issue with Dr. [place]. Prefers [facility] [address]   Shingrix is a vaccine for the prevention of Shingles in Adults 50 and older.  If you are on Medicare, you can request a prescription from your doctor to be filled at a pharmacy.  Please check with your benefits regarding applicable copays or out of pocket expenses.  The Shingrix is given in 2 vaccines approx 8 weeks apart. You must receive the 2nd dose prior to 6 months from receipt of the first.     These are the goals we discussed: Goals    . Patient Stated     Will take care of you and enjoy your new environment        This is a list of the screening recommended for you and due dates:  Health Maintenance  Topic Date Due  . DEXA scan (bone density measurement)  12/23/2001  . Flu Shot  09/08/2018*  . Pneumonia vaccines (1 of 2 - PCV13) 01/07/2019*  . Tetanus Vaccine  08/30/2024  *Topic was postponed. The date shown is not the original due date.   Prevention of falls: Remove rugs or any tripping hazards in the home Use Non slip mats in bathtubs and showers Placing grab bars next to the toilet and or shower Placing handrails on both sides of the stair way Adding extra lighting in the home.   Personal safety issues reviewed:  1. Consider starting a community watch program per [facility] 2.  Changes batteries is smoke detector and/or carbon monoxide detector  3.  If you have firearms; keep them in a safe place 4.  Wear protection when in the sun; Always wear sunscreen or a hat; It is good to have your doctor check your skin annually or review any new areas of concern 5. Driving safety; Keep in the  right lane; stay 3 car lengths behind the car in front of you on the highway; look 3 times prior to pulling out; carry your cell phone everywhere you go!    Learn about the Yellow Dot program:  The program allows first responders at your emergency to have access to who your physician is, as well as your medications and medical conditions.  Citizens requesting the Yellow Dot Packages should contact Master Corporal Nunzio Cobbs at the [facility] ([phone] for the first week of the program and beginning the week after Easter citizens should contact their Scientist, physiological.       Bone Densitometry Bone densitometry is an imaging test that uses a special X-ray to measure the amount of calcium and other minerals in your bones (bone density). This test is also known as a bone mineral density test or dual-energy X-ray absorptiometry (DXA). The test can measure bone density at your hip and your spine. It is similar to having a regular X-ray. You may have this test to:  Diagnose a condition that causes weak or thin bones (osteoporosis).  Predict your risk of a broken bone (fracture).  Determine how well osteoporosis treatment is working.  Tell a health care provider about:  Any allergies you have.  All medicines you are  taking, including vitamins, herbs, eye drops, creams, and over-the-counter medicines.  Any problems you or family members have had with anesthetic medicines.  Any blood disorders you have.  Any surgeries you have had.  Any medical conditions you have.  Possibility of pregnancy.  Any other medical test you had within the previous 14 days that used contrast material. What are the risks? Generally, this is a safe procedure. However, problems can occur and may include the following:  This test exposes you to a very small amount of radiation.  The risks of radiation exposure may be greater to unborn children.  What happens before  the procedure?  Do not take any calcium supplements for 24 hours before having the test. You can otherwise eat and drink what you usually do.  Take off all metal jewelry, eyeglasses, dental appliances, and any other metal objects. What happens during the procedure?  You may lie on an exam table. There will be an X-ray generator below you and an imaging device above you.  Other devices, such as boxes or braces, may be used to position your body properly for the scan.  You will need to lie still while the machine slowly scans your body.  The images will show up on a computer monitor. What happens after the procedure? You may need more testing at a later time. This information is not intended to replace advice given to you by your health care provider. Make sure you discuss any questions you have with your health care provider. Document Released: 10/28/2004 Document Revised: 03/13/2016 Document Reviewed: 03/16/2014 Elsevier Interactive Patient Education  2018 [place] in the Home Falls can cause injuries. They can happen to people of all ages. There are many things you can do to make your home safe and to help prevent falls. What can I do on the outside of my home?  Regularly fix the edges of walkways and driveways and fix any cracks.  Remove anything that might make you trip as you walk through a door, such as a raised step or threshold.  Trim any bushes or trees on the path to your home.  Use bright outdoor lighting.  Clear any walking paths of anything that might make someone trip, such as rocks or tools.  Regularly check to see if handrails are loose or broken. Make sure that both sides of any steps have handrails.  Any raised decks and porches should have guardrails on the edges.  Have any leaves, snow, or ice cleared regularly.  Use sand or salt on walking paths during winter.  Clean up any spills in your garage right away. This includes oil or  grease spills. What can I do in the bathroom?  Use night lights.  Install grab bars by the toilet and in the tub and shower. Do not use towel bars as grab bars.  Use non-skid mats or decals in the tub or shower.  If you need to sit down in the shower, use a plastic, non-slip stool.  Keep the floor dry. Clean up any water that spills on the floor as soon as it happens.  Remove soap buildup in the tub or shower regularly.  Attach bath mats securely with double-sided non-slip rug tape.  Do not have throw rugs and other things on the floor that can make you trip. What can I do in the bedroom?  Use night lights.  Make sure that you have a light by your bed that is easy  to reach.  Do not use any sheets or blankets that are too big for your bed. They should not hang down onto the floor.  Have a firm chair that has side arms. You can use this for support while you get dressed.  Do not have throw rugs and other things on the floor that can make you trip. What can I do in the kitchen?  Clean up any spills right away.  Avoid walking on wet floors.  Keep items that you use a lot in easy-to-reach places.  If you need to reach something above you, use a strong step stool that has a grab bar.  Keep electrical cords out of the way.  Do not use floor polish or wax that makes floors slippery. If you must use wax, use non-skid floor wax.  Do not have throw rugs and other things on the floor that can make you trip. What can I do with my stairs?  Do not leave any items on the stairs.  Make sure that there are handrails on both sides of the stairs and use them. Fix handrails that are broken or loose. Make sure that handrails are as long as the stairways.  Check any carpeting to make sure that it is firmly attached to the stairs. Fix any carpet that is loose or worn.  Avoid having throw rugs at the top or bottom of the stairs. If you do have throw rugs, attach them to the floor with  carpet tape.  Make sure that you have a light switch at the top of the stairs and the bottom of the stairs. If you do not have them, ask someone to add them for you. What else can I do to help prevent falls?  Wear shoes that: ? Do not have high heels. ? Have rubber bottoms. ? Are comfortable and fit you well. ? Are closed at the toe. Do not wear sandals.  If you use a stepladder: ? Make sure that it is fully opened. Do not climb a closed stepladder. ? Make sure that both sides of the stepladder are locked into place. ? Ask someone to hold it for you, if possible.  Clearly mark and make sure that you can see: ? Any grab bars or handrails. ? First and last steps. ? Where the edge of each step is.  Use tools that help you move around (mobility aids) if they are needed. These include: ? Canes. ? Walkers. ? Scooters. ? Crutches.  Turn on the lights when you go into a dark area. Replace any light bulbs as soon as they burn out.  Set up your furniture so you have a clear path. Avoid moving your furniture around.  If any of your floors are uneven, fix them.  If there are any pets around you, be aware of where they are.  Review your medicines with your doctor. Some medicines can make you feel dizzy. This can increase your chance of falling. Ask your doctor what other things that you can do to help prevent falls. This information is not intended to replace advice given to you by your health care provider. Make sure you discuss any questions you have with your health care provider. Document Released: 08/02/2009 Document Revised: 03/13/2016 Document Reviewed: 11/10/2014 Elsevier Interactive Patient Education  2018 [place] Maintenance, Female Adopting a healthy lifestyle and getting preventive care can go a long way to promote health and wellness. Talk with your health care provider about what  schedule of regular examinations is right for you. This is a good chance for you  to check in with your provider about disease prevention and staying healthy. In between checkups, there are plenty of things you can do on your own. Experts have done a lot of research about which lifestyle changes and preventive measures are most likely to keep you healthy. Ask your health care provider for more information. Weight and diet Eat a healthy diet  Be sure to include plenty of vegetables, fruits, low-fat dairy products, and lean protein.  Do not eat a lot of foods high in solid fats, added sugars, or salt.  Get regular exercise. This is one of the most important things you can do for your health. ? Most adults should exercise for at least 150 minutes each week. The exercise should increase your heart rate and make you sweat (moderate-intensity exercise). ? Most adults should also do strengthening exercises at least twice a week. This is in addition to the moderate-intensity exercise.  Maintain a healthy weight  Body mass index (BMI) is a measurement that can be used to identify possible weight problems. It estimates body fat based on height and weight. Your health care provider can help determine your BMI and help you achieve or maintain a healthy weight.  For females 69 years of age and older: ? A BMI below 18.5 is considered underweight. ? A BMI of 18.5 to 24.9 is normal. ? A BMI of 25 to 29.9 is considered overweight. ? A BMI of 30 and above is considered obese.  Watch levels of cholesterol and blood lipids  You should start having your blood tested for lipids and cholesterol at 81 years of age, then have this test every 5 years.  You may need to have your cholesterol levels checked more often if: ? Your lipid or cholesterol levels are high. ? You are older than 81 years of age. ? You are at high risk for heart disease.  Cancer screening Lung Cancer  Lung cancer screening is recommended for adults 87-42 years old who are at high risk for lung cancer because of a  history of smoking.  A yearly low-dose CT scan of the lungs is recommended for people who: ? Currently smoke. ? Have quit within the past 15 years. ? Have at least a 30-pack-year history of smoking. A pack year is smoking an average of one pack of cigarettes a day for 1 year.  Yearly screening should continue until it has been 15 years since you quit.  Yearly screening should stop if you develop a health problem that would prevent you from having lung cancer treatment.  Breast Cancer  Practice breast self-awareness. This means understanding how your breasts normally appear and feel.  It also means doing regular breast self-exams. Let your health care provider know about any changes, no matter how small.  If you are in your 20s or 30s, you should have a clinical breast exam (CBE) by a health care provider every 1-3 years as part of a regular health exam.  If you are 75 or older, have a CBE every year. Also consider having a breast X-ray (mammogram) every year.  If you have a family history of breast cancer, talk to your health care provider about genetic screening.  If you are at high risk for breast cancer, talk to your health care provider about having an MRI and a mammogram every year.  Breast cancer gene (BRCA) assessment is recommended for women  who have family members with BRCA-related cancers. BRCA-related cancers include: ? Breast. ? Ovarian. ? Tubal. ? Peritoneal cancers.  Results of the assessment will determine the need for genetic counseling and BRCA1 and BRCA2 testing.  Cervical Cancer Your health care provider may recommend that you be screened regularly for cancer of the pelvic organs (ovaries, uterus, and vagina). This screening involves a pelvic examination, including checking for microscopic changes to the surface of your cervix (Pap test). You may be encouraged to have this screening done every 3 years, beginning at age 57.  For women ages 54-65, health care  providers may recommend pelvic exams and Pap testing every 3 years, or they may recommend the Pap and pelvic exam, combined with testing for human papilloma virus (HPV), every 5 years. Some types of HPV increase your risk of cervical cancer. Testing for HPV may also be done on women of any age with unclear Pap test results.  Other health care providers may not recommend any screening for nonpregnant women who are considered low risk for pelvic cancer and who do not have symptoms. Ask your health care provider if a screening pelvic exam is right for you.  If you have had past treatment for cervical cancer or a condition that could lead to cancer, you need Pap tests and screening for cancer for at least 20 years after your treatment. If Pap tests have been discontinued, your risk factors (such as having a new sexual partner) need to be reassessed to determine if screening should resume. Some women have medical problems that increase the chance of getting cervical cancer. In these cases, your health care provider may recommend more frequent screening and Pap tests.  Colorectal Cancer  This type of cancer can be detected and often prevented.  Routine colorectal cancer screening usually begins at 81 years of age and continues through 81 years of age.  Your health care provider may recommend screening at an earlier age if you have risk factors for colon cancer.  Your health care provider may also recommend using home test kits to check for hidden blood in the stool.  A small camera at the end of a tube can be used to examine your colon directly (sigmoidoscopy or colonoscopy). This is done to check for the earliest forms of colorectal cancer.  Routine screening usually begins at age 39.  Direct examination of the colon should be repeated every 5-10 years through 81 years of age. However, you may need to be screened more often if early forms of precancerous polyps or small growths are found.  Skin  Cancer  Check your skin from head to toe regularly.  Tell your health care provider about any new moles or changes in moles, especially if there is a change in a mole's shape or color.  Also tell your health care provider if you have a mole that is larger than the size of a pencil eraser.  Always use sunscreen. Apply sunscreen liberally and repeatedly throughout the day.  Protect yourself by wearing long sleeves, pants, a wide-brimmed hat, and sunglasses whenever you are outside.  Heart disease, diabetes, and high blood pressure  High blood pressure causes heart disease and increases the risk of stroke. High blood pressure is more likely to develop in: ? People who have blood pressure in the high end of the normal range (130-139/85-89 mm Hg). ? People who are overweight or obese. ? People who are African American.  If you are 26-72 years of age, have  your blood pressure checked every 3-5 years. If you are 64 years of age or older, have your blood pressure checked every year. You should have your blood pressure measured twice-once when you are at a hospital or clinic, and once when you are not at a hospital or clinic. Record the average of the two measurements. To check your blood pressure when you are not at a hospital or clinic, you can use: ? An automated blood pressure machine at a pharmacy. ? A home blood pressure monitor.  If you are between 9 years and 17 years old, ask your health care provider if you should take aspirin to prevent strokes.  Have regular diabetes screenings. This involves taking a blood sample to check your fasting blood sugar level. ? If you are at a normal weight and have a low risk for diabetes, have this test once every three years after 81 years of age. ? If you are overweight and have a high risk for diabetes, consider being tested at a younger age or more often. Preventing infection Hepatitis B  If you have a higher risk for hepatitis B, you should be  screened for this virus. You are considered at high risk for hepatitis B if: ? You were born in a country where hepatitis B is common. Ask your health care provider which countries are considered high risk. ? Your parents were born in a high-risk country, and you have not been immunized against hepatitis B (hepatitis B vaccine). ? You have HIV or AIDS. ? You use needles to inject street drugs. ? You live with someone who has hepatitis B. ? You have had sex with someone who has hepatitis B. ? You get hemodialysis treatment. ? You take certain medicines for conditions, including cancer, organ transplantation, and autoimmune conditions.  Hepatitis C  Blood testing is recommended for: ? Everyone born from [birth year] through 1963. ? Anyone with known risk factors for hepatitis C.  Sexually transmitted infections (STIs)  You should be screened for sexually transmitted infections (STIs) including gonorrhea and chlamydia if: ? You are sexually active and are younger than 81 years of age. ? You are older than 81 years of age and your health care provider tells you that you are at risk for this type of infection. ? Your sexual activity has changed since you were last screened and you are at an increased risk for chlamydia or gonorrhea. Ask your health care provider if you are at risk.  If you do not have HIV, but are at risk, it may be recommended that you take a prescription medicine daily to prevent HIV infection. This is called pre-exposure prophylaxis (PrEP). You are considered at risk if: ? You are sexually active and do not regularly use condoms or know the HIV status of your partner(s). ? You take drugs by injection. ? You are sexually active with a partner who has HIV.  Talk with your health care provider about whether you are at high risk of being infected with HIV. If you choose to begin PrEP, you should first be tested for HIV. You should then be tested every 3 months for as long as you are  taking PrEP. Pregnancy  If you are premenopausal and you may become pregnant, ask your health care provider about preconception counseling.  If you may become pregnant, take 400 to 800 micrograms (mcg) of folic acid every day.  If you want to prevent pregnancy, talk to your health care provider about birth control (  contraception). Osteoporosis and menopause  Osteoporosis is a disease in which the bones lose minerals and strength with aging. This can result in serious bone fractures. Your risk for osteoporosis can be identified using a bone density scan.  If you are 65 years of age or older, or if you are at risk for osteoporosis and fractures, ask your health care provider if you should be screened.  Ask your health care provider whether you should take a calcium or vitamin D supplement to lower your risk for osteoporosis.  Menopause may have certain physical symptoms and risks.  Hormone replacement therapy may reduce some of these symptoms and risks. Talk to your health care provider about whether hormone replacement therapy is right for you. Follow these instructions at home:  Schedule regular health, dental, and eye exams.  Stay current with your immunizations.  Do not use any tobacco products including cigarettes, chewing tobacco, or electronic cigarettes.  If you are pregnant, do not drink alcohol.  If you are breastfeeding, limit how much and how often you drink alcohol.  Limit alcohol intake to no more than 1 drink per day for nonpregnant women. One drink equals 12 ounces of beer, 5 ounces of wine, or 1 ounces of hard liquor.  Do not use street drugs.  Do not share needles.  Ask your health care provider for help if you need support or information about quitting drugs.  Tell your health care provider if you often feel depressed.  Tell your health care provider if you have ever been abused or do not feel safe at home. This information is not intended to replace  advice given to you by your health care provider. Make sure you discuss any questions you have with your health care provider. Document Released: 04/21/2011 Document Revised: 03/13/2016 Document Reviewed: 07/10/2015 Elsevier Interactive Patient Education  [employer].

## 2018-01-08 ENCOUNTER — Other Ambulatory Visit: Payer: Self-pay | Admitting: Family Medicine

## 2018-01-08 DIAGNOSIS — F411 Generalized anxiety disorder: Secondary | ICD-10-CM

## 2018-01-08 DIAGNOSIS — F5104 Psychophysiologic insomnia: Secondary | ICD-10-CM

## 2018-01-08 MED ORDER — ALPRAZOLAM 0.5 MG PO TABS: ORAL_TABLET | ORAL | 1 refills | Status: DC

## 2018-01-08 NOTE — Telephone Encounter (Signed)
Please contact pharmacy and clarify Rx for Alprazolam 0.5 mg. She is taking a tab at bedtime daily and can take an extra 1/2-1 tab if needed but she cannot have more that 40 tabs for 30 days. Last Rx filled 12/24/17 and it seems like she receive 30 tabs.   Thanks, BJ

## 2018-01-11 DIAGNOSIS — H04123 Dry eye syndrome of bilateral lacrimal glands: Secondary | ICD-10-CM | POA: Diagnosis not present

## 2018-01-11 NOTE — Telephone Encounter (Signed)
Spoke with pharmacy, medication clarified.

## 2018-01-22 ENCOUNTER — Encounter: Payer: Self-pay | Admitting: Family Medicine

## 2018-01-22 DIAGNOSIS — Z1231 Encounter for screening mammogram for malignant neoplasm of breast: Secondary | ICD-10-CM | POA: Diagnosis not present

## 2018-01-22 DIAGNOSIS — M810 Age-related osteoporosis without current pathological fracture: Secondary | ICD-10-CM | POA: Diagnosis not present

## 2018-01-31 NOTE — Progress Notes (Signed)
HPI:   [name] is a 83 y.o. female, who is here today for 4 months follow up.   She was last seen on 10/28/17.   Vit D D Deficiency: She is on Ergocalciferol 50,000 U, prescribed q 2 weeks. She has not taken it in 8 weeks because exacerbates headaches, states that Vit D supplementation has always done so,including OTC.    Anxiety disorder and insomnia:  She is on Xanax 0.5 mg at bedtime, she takes a second dose as needed but no more than 40 tabs per month. Last Rx filled 01/20/18 Gabapentin also helps her sleep. She has not tolerated other medications, they all aggravated headache.  Problem is stable.  Back pain:  Lower back pain mainly. Hx of OA. Pain is radiated to RLE down to knee, stable. No saddle anesthesia or changes in bowel/urine continence. She takes Gabapentin 600 mg at bedtime. She uses OTC icy hot path on back.  C/O knee pain, L>R. Severe sometimes,exacerbated by prolonged walking and standing. + Stiffness, exacerbated by standing up after prolonged rest. Left knee swelling, no erythema.  She is on Zanaflex 2 mg 2 tabs at bedtime as needed, which helps.  She was referred to pain management but decided not to establish.    Iron deficiency anemia:  She has not noted changes in bowel habits,blood in the stool,or gross hematuria.  Lab Results  Component Value Date   WBC 6.0 10/28/2017   HGB 10.9 (L) 10/28/2017   HCT 33.5 (L) 10/28/2017   MCV 89.9 10/28/2017   PLT 249.0 10/28/2017   She is on iron supplementation.  She is also requesting refills on Tretinoin cream 0.1%, which was prescribed by dermatologist for "brown spots" on face and forearms.    Review of Systems  Constitutional: Positive for fatigue (no more than usual.). Negative for activity change, appetite change and fever.  HENT: Negative for mouth sores, nosebleeds and trouble swallowing.   Eyes: Negative for redness and visual disturbance.  Respiratory: Negative for  cough, shortness of breath and wheezing.   Cardiovascular: Negative for chest pain, palpitations and leg swelling.  Gastrointestinal: Negative for abdominal pain, blood in stool, nausea and vomiting.       Negative for changes in bowel habits.  Genitourinary: Negative for decreased urine volume and hematuria.  Musculoskeletal: Positive for arthralgias and back pain. Negative for gait problem and joint swelling.  Skin: Negative for pallor and rash.  Neurological: Positive for headaches (Hx of migraines). Negative for syncope and weakness.  Psychiatric/Behavioral: Positive for sleep disturbance. Negative for confusion. The patient is nervous/anxious.      Current Outpatient Medications on File Prior to Visit  Medication Sig Dispense Refill  . ALPRAZolam (XANAX) 0.5 MG tablet 1 tab at bedtime and can have an extra 1/2-1 tab if needed but no more than 40 tabs total per month. 40 tablet 1  . gabapentin (NEURONTIN) 300 MG capsule Take 1 capsule (300 mg total) by mouth at bedtime. 90 capsule 1  . lansoprazole (PREVACID) 15 MG capsule 1 tab at 7:00 am (30 min before breakfast). 90 capsule 1  . Magnesium 250 MG TABS Take 250 mg by mouth daily. 2 PO DAILY    . MAXALT-MLT 10 MG disintegrating tablet Take 1 tablet (10 mg total) by mouth as needed for migraine. No more than 3 tabs per week. 12 tablet 3  . Riboflavin 400 MG CAPS Take 1 capsule (400 mg total) by mouth daily.    Marland Kitchen  Turmeric 500 MG CAPS Take 1 capsule by mouth daily.    . verapamil (VERELAN PM) 180 MG 24 hr capsule Take 1 capsule (180 mg total) by mouth daily. 90 capsule 2  . Vitamin D, Ergocalciferol, (DRISDOL) 50000 units CAPS capsule 1 cap every 2 weeks. 12 capsule 3   No current facility-administered medications on file prior to visit.      Past Medical History:  Diagnosis Date  . Anxiety   . Arthritis   . Chronic insomnia 03/30/2015  . Chronic low back pain 12/02/2016  . Depression   . GERD (gastroesophageal reflux disease)   .  Hiatal hernia   . High cholesterol   . Hypertelorism   . Hypertension   . Meningioma ([place])   . Migraine    Allergies  Allergen Reactions  . Dihydrotachysterol     Other reaction(s): Other (See Comments)  . Trazodone And Nefazodone Other (See Comments)    Worsens headaches  . Valium [Diazepam] Other (See Comments)    headaches  . Citalopram     Other reaction(s): Headache  . Other     "States all antidepressants cause migraines."    Social History   Socioeconomic History  . Marital status: Widowed    Spouse name: Not on file  . Number of children: 4  . Years of education: 31  . Highest education level: Not on file  Occupational History  . Occupation: Retired  Scientific laboratory technician  . Financial resource strain: Not on file  . Food insecurity:    Worry: Not on file    Inability: Not on file  . Transportation needs:    Medical: Not on file    Non-medical: Not on file  Tobacco Use  . Smoking status: Never Smoker  . Smokeless tobacco: Never Used  Substance and Sexual Activity  . Alcohol use: No  . Drug use: No  . Sexual activity: Not Currently  Lifestyle  . Physical activity:    Days per week: Not on file    Minutes per session: Not on file  . Stress: Not on file  Relationships  . Social connections:    Talks on phone: Not on file    Gets together: Not on file    Attends religious service: Not on file    Active member of club or organization: Not on file    Attends meetings of clubs or organizations: Not on file    Relationship status: Not on file  Other Topics Concern  . Not on file  Social History Narrative   Lives at home alone   Patient drinks 1 glass of tea daily.   Patient is right handed.    Vitals:   02/01/18 0953  BP: 126/70  Pulse: 89  Resp: 12  Temp: 98.1 F (36.7 C)  SpO2: 98%   Body mass index is 28.18 kg/m.    Physical Exam  Nursing note and vitals reviewed. Constitutional: She is oriented to person, place, and time. She appears  well-developed and well-nourished. No distress.  HENT:  Head: Normocephalic and atraumatic.  Mouth/Throat: Oropharynx is clear and moist and mucous membranes are normal.  Eyes: Pupils are equal, round, and reactive to light. Conjunctivae are normal.  Cardiovascular: Normal rate and regular rhythm.  No murmur heard. Pulses:      Dorsalis pedis pulses are 2+ on the right side, and 2+ on the left side.  Respiratory: Effort normal and breath sounds normal. No respiratory distress.  GI: Soft. She exhibits no  mass. There is no hepatomegaly. There is no tenderness.  Musculoskeletal: She exhibits no edema.       Cervical back: She exhibits tenderness (right cervical paraspinal muscles.). She exhibits no bony tenderness.       Lumbar back: She exhibits no tenderness and no bony tenderness.       Back:  Kyphosis. Knee crepitus bilateral. Mild limitation of extension left knee and minimal effusion.No erythema.  Lymphadenopathy:    She has no cervical adenopathy.  Neurological: She is alert and oriented to person, place, and time. She has normal strength. Gait normal.  Skin: Skin is warm. No erythema.  Scattered mild hyperpigmented macular lesions on forearms. Lesion on face not evaluated because make up.   Psychiatric: Her mood appears anxious.  Well groomed, good eye contact.      ASSESSMENT AND PLAN:   [name] was seen today for 4 months follow-up.  Orders Placed This Encounter  Procedures  . CBC with Differential/Platelet  . VITAMIN D 25 Hydroxy (Vit-D Deficiency, Fractures)   Lab Results  Component Value Date   WBC 4.8 02/01/2018   HGB 13.6 02/01/2018   HCT 40.4 02/01/2018   MCV 91.8 02/01/2018   PLT 252.0 02/01/2018     1. Vitamin D deficiency  No changes in current management, will follow labs done today and will give further recommendations accordingly.  - VITAMIN D 25 Hydroxy (Vit-D Deficiency, Fractures)  2. Chronic insomnia  Well controlled. No  changes in Xanax or Gabapentin dose. Good sleep hygiene.  3. Generalized anxiety disorder  Stable overall. She has not tolerated SSRI and SNRI medications in the past. No changes in Xanax dose. Side effects of medications discussed.   4. Chronic low back pain, unspecified back pain laterality, with sciatica presence unspecified  Stable. Continue Gabapentin and OTC Icy hot patch. Zanaflex changed to 4 mg tab to continue once daily as needed. Fall precautions discussed.   5. Iron deficiency anemia, unspecified iron deficiency anemia type  No changes in current management, will follow CBC and will give further recommendations accordingly.  - CBC with Differential/Platelet  6. Osteoarthritis of both knees, unspecified osteoarthritis type  Avoid activities that aggravate pain if possible. Tylenol arthritis 3 times per daily recommended.    -[name] was advised to return sooner than planned today if new concerns arise.       Alexiya Franqui G. Martinique, MD  [facility]. [place] office.

## 2018-02-01 ENCOUNTER — Encounter: Payer: Self-pay | Admitting: Family Medicine

## 2018-02-01 ENCOUNTER — Ambulatory Visit (INDEPENDENT_AMBULATORY_CARE_PROVIDER_SITE_OTHER): Payer: Medicare Other | Admitting: Family Medicine

## 2018-02-01 VITALS — BP 126/70 | HR 89 | Temp 98.1°F | Resp 12 | Ht 61.0 in | Wt 149.1 lb

## 2018-02-01 DIAGNOSIS — F5104 Psychophysiologic insomnia: Secondary | ICD-10-CM | POA: Diagnosis not present

## 2018-02-01 DIAGNOSIS — F411 Generalized anxiety disorder: Secondary | ICD-10-CM

## 2018-02-01 DIAGNOSIS — G8929 Other chronic pain: Secondary | ICD-10-CM

## 2018-02-01 DIAGNOSIS — M170 Bilateral primary osteoarthritis of knee: Secondary | ICD-10-CM | POA: Diagnosis not present

## 2018-02-01 DIAGNOSIS — D509 Iron deficiency anemia, unspecified: Secondary | ICD-10-CM

## 2018-02-01 DIAGNOSIS — M1710 Unilateral primary osteoarthritis, unspecified knee: Secondary | ICD-10-CM | POA: Insufficient documentation

## 2018-02-01 DIAGNOSIS — M545 Low back pain: Secondary | ICD-10-CM | POA: Diagnosis not present

## 2018-02-01 DIAGNOSIS — M179 Osteoarthritis of knee, unspecified: Secondary | ICD-10-CM | POA: Insufficient documentation

## 2018-02-01 DIAGNOSIS — E559 Vitamin D deficiency, unspecified: Secondary | ICD-10-CM | POA: Diagnosis not present

## 2018-02-01 LAB — CBC WITH DIFFERENTIAL/PLATELET
BASOS PCT: 0.8 % (ref 0.0–3.0)
Basophils Absolute: 0 10*3/uL (ref 0.0–0.1)
EOS ABS: 0 10*3/uL (ref 0.0–0.7)
Eosinophils Relative: 1 % (ref 0.0–5.0)
HEMATOCRIT: 40.4 % (ref 36.0–46.0)
Hemoglobin: 13.6 g/dL (ref 12.0–15.0)
Lymphocytes Relative: 33.1 % (ref 12.0–46.0)
Lymphs Abs: 1.6 10*3/uL (ref 0.7–4.0)
MCHC: 33.7 g/dL (ref 30.0–36.0)
MCV: 91.8 fl (ref 78.0–100.0)
Monocytes Absolute: 0.5 10*3/uL (ref 0.1–1.0)
Monocytes Relative: 9.5 % (ref 3.0–12.0)
NEUTROS ABS: 2.6 10*3/uL (ref 1.4–7.7)
Neutrophils Relative %: 55.6 % (ref 43.0–77.0)
PLATELETS: 252 10*3/uL (ref 150.0–400.0)
RBC: 4.4 Mil/uL (ref 3.87–5.11)
RDW: 14.1 % (ref 11.5–15.5)
WBC: 4.8 10*3/uL (ref 4.0–10.5)

## 2018-02-01 LAB — VITAMIN D 25 HYDROXY (VIT D DEFICIENCY, FRACTURES): VITD: 32.48 ng/mL (ref 30.00–100.00)

## 2018-02-01 MED ORDER — TIZANIDINE HCL 4 MG PO TABS: 4.0000 mg | ORAL_TABLET | Freq: Two times a day (BID) | ORAL | 1 refills | Status: DC | PRN

## 2018-02-01 MED ORDER — TRETINOIN 0.1 % EX CREA: TOPICAL_CREAM | Freq: Every day | CUTANEOUS | 2 refills | Status: DC

## 2018-02-01 NOTE — Patient Instructions (Signed)
A few things to remember from today's visit:   Vitamin D deficiency - Plan: VITAMIN D 25 Hydroxy (Vit-D Deficiency, Fractures)  Chronic insomnia  Generalized anxiety disorder  Chronic low back pain, unspecified back pain laterality, with sciatica presence unspecified  Iron deficiency anemia, unspecified iron deficiency anemia type - Plan: CBC with Differential/Platelet  Osteoarthritis of both knees, unspecified osteoarthritis type  No changes today. With the one more than 40 tablets of Xanax per month. The pain seems to be related to osteoarthritis. Tylenol arthritis 3 times per day might help.  Please be sure medication list is accurate. If a new problem present, please set up appointment sooner than planned today.

## 2018-03-29 ENCOUNTER — Other Ambulatory Visit: Payer: Self-pay | Admitting: Family Medicine

## 2018-03-29 DIAGNOSIS — F5104 Psychophysiologic insomnia: Secondary | ICD-10-CM

## 2018-03-29 DIAGNOSIS — F411 Generalized anxiety disorder: Secondary | ICD-10-CM

## 2018-04-13 DIAGNOSIS — H04123 Dry eye syndrome of bilateral lacrimal glands: Secondary | ICD-10-CM | POA: Diagnosis not present

## 2018-05-19 DIAGNOSIS — H25811 Combined forms of age-related cataract, right eye: Secondary | ICD-10-CM | POA: Diagnosis not present

## 2018-05-19 DIAGNOSIS — H2511 Age-related nuclear cataract, right eye: Secondary | ICD-10-CM | POA: Diagnosis not present

## 2018-05-25 ENCOUNTER — Ambulatory Visit (INDEPENDENT_AMBULATORY_CARE_PROVIDER_SITE_OTHER): Payer: Medicare Other | Admitting: Physician Assistant

## 2018-05-25 ENCOUNTER — Encounter (INDEPENDENT_AMBULATORY_CARE_PROVIDER_SITE_OTHER): Payer: Self-pay | Admitting: Physician Assistant

## 2018-05-25 ENCOUNTER — Ambulatory Visit (INDEPENDENT_AMBULATORY_CARE_PROVIDER_SITE_OTHER): Payer: Medicare Other

## 2018-05-25 DIAGNOSIS — M1712 Unilateral primary osteoarthritis, left knee: Secondary | ICD-10-CM

## 2018-05-25 MED ORDER — LIDOCAINE HCL 1 % IJ SOLN
3.0000 mL | INTRAMUSCULAR | Status: AC | PRN
  Administered 2018-05-25: 3 mL

## 2018-05-25 MED ORDER — METHYLPREDNISOLONE ACETATE 40 MG/ML IJ SUSP
40.0000 mg | INTRAMUSCULAR | Status: AC | PRN
  Administered 2018-05-25: 40 mg via INTRA_ARTICULAR

## 2018-05-25 NOTE — Progress Notes (Signed)
Office Visit Note   Patient: Tracey Armstrong           Date of Birth: 05/03/1935           MRN: [id number] Visit Date: 05/25/2018              Requested by: Martinique, [name], MD [address] [place], [place] [address] PCP: Martinique, [name], MD   Assessment & Plan: Visit Diagnoses:  1. Primary osteoarthritis of left knee     Plan: Advised her to work on Forensic scientist.  She states that habits with where she resides has a exercise class and she will start going.  Otherwise she understands that she can have cortisone injections in the knee no more often than every 3 months.  She will follow-up on an as-needed basis.  Questions encouraged and answered.  Follow-Up Instructions: Return if symptoms worsen or fail to improve.   Orders:  Orders Placed This Encounter  Procedures  . Large Joint Inj  . XR Knee 1-2 Views Left   No orders of the defined types were placed in this encounter.     Procedures: Large Joint Inj: L knee on 05/25/2018 12:06 PM Indications: pain Details: 22 Armstrong 1.5 in needle, anterolateral approach  Arthrogram: No  Medications: 3 mL lidocaine 1 %; 40 mg methylPREDNISolone acetate 40 MG/ML Outcome: tolerated well, no immediate complications Procedure, treatment alternatives, risks and benefits explained, specific risks discussed. Consent was given by the patient. Immediately prior to procedure a time out was called to verify the correct patient, procedure, equipment, support staff and site/side marked as required. Patient was prepped and draped in the usual sterile fashion.       Clinical Data: No additional findings.   Subjective: Chief Complaint  Patient presents with  . Left Knee - Pain    HPI Tracey Armstrong is an 83 year old female comes in today complaining of left knee pain.  She is actually seeing letter from Tracey Armstrong and is worked in.  She is had no acute injury to the left knee.  Does states she is had some falls in the past and recently.   Notes that she has had pain in the left knee for years.  She is tried over-the-counter anti-inflammatories and Tylenol and is worried about taking these for prolonged period of time to control her left knee pain.  She has had no radiographs in the knee.  She is having no mechanical symptoms of the knee.  She is nondiabetic. Review of Systems Please see HPI otherwise negative  Objective: Vital Signs: There were no vitals taken for this visit.  Physical Exam  Constitutional: She is oriented to person, place, and time. She appears well-developed and well-nourished. No distress.  Pulmonary/Chest: Effort normal.  Neurological: She is alert and oriented to person, place, and time.  Skin: She is not diaphoretic.  Psychiatric: She has a normal mood and affect.    Ortho Exam Bilateral knees valgus deformities left greater than right.  Maximal tenderness left knee over the lateral joint line.  Some tenderness over the medial joint line.  No effusion abnormal warmth erythema of either knee.  No instability valgus varus stressing of either knee.  Good range of motion of both knees without significant pain. Specialty Comments:  No specialty comments available.  Imaging: Xr Knee 1-2 Views Left  Result Date: 05/25/2018 Left knee AP and lateral views: No acute fractures.  Valgus deformities of the right and left knee on the AP  views.  She has lateral compartmental wear with left being greater than right left knee moderate severe right knee moderate arthritic changes.  Medial compartment of both knees overall appears well-preserved.  Lateral view left knee shows moderate patellofemoral changes.  Knee is otherwise well located.    PMFS History: Patient Active Problem List   Diagnosis Date Noted  . Knee osteoarthritis 02/01/2018  . Iron deficiency anemia 10/28/2017  . Rhinitis, allergic 07/27/2017  . GERD (gastroesophageal reflux disease) 06/16/2017  . Chronic pain disorder 06/16/2017  . Vitamin D  deficiency 05/25/2017  . Hyperlipidemia 05/25/2017  . 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 Pain 02/26/2017  . Chronic low back pain 12/02/2016  . Abdominal pain 03/26/2016  . Essential (primary) hypertension 03/26/2016  . 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 Peptic esophagitis 03/26/2016  . Neuralgia neuritis, sciatic nerve 03/26/2016  . 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[place]) 06/20/2015  . Chronic insomnia 03/30/2015  . Chronic migraine 01/10/2015  . Depression 04/20/2014  . Generalized anxiety disorder 04/20/2014  . Chronic back pain 04/20/2014  . Chronic daily headache 04/20/2014  . Unspecified essential hypertension 04/20/2014  . Migraine headache 04/19/2014   Past Medical History:  Diagnosis Date  . Anxiety   . Arthritis   . Chronic insomnia 03/30/2015  . Chronic low back pain 12/02/2016  . Depression   . GERD (gastroesophageal reflux disease)   . Hiatal hernia   . High cholesterol   . Hypertelorism   . Hypertension   . Meningioma ([place])   . Migraine     Family History  Problem Relation Age of Onset  . Migraines Father   . Stroke Father   . Heart Problems Mother   . Epilepsy Brother     Past Surgical History:  Procedure Laterality Date  . STOMACH SURGERY     Social History   Occupational History  . Occupation: Retired  Tobacco Use  . Smoking status: Never Smoker  . Smokeless tobacco: Never Used  Substance and Sexual Activity  . Alcohol use: No  . Drug use: No  . Sexual activity: Not Currently

## 2018-07-25 NOTE — Progress Notes (Signed)
HPI:   [name] is a 83 y.o. female, who is here today for 6 months follow up.   She was last seen on 02/01/18  Since her last OV she has seen orthopedist, she received a intra-articular injection in her left knee.   Hypertension:  Currently on verapamil 180 mg daily. She is taking medications as instructed, no side effects reported.  She has not noted unusual headache, visual changes, exertional chest pain, dyspnea,  focal weakness, or edema.   Lab Results  Component Value Date   CREATININE 0.88 02/26/2017   BUN 11 02/26/2017   NA 134 02/26/2017   K 4.8 02/26/2017   CL 96 02/26/2017   CO2 23 02/26/2017    Anxiety:  Currently she is on alprazolam 0.5-1 mg daily, we had decreased monthly supply from 90 tablets to 40 tablets. She takes alprazolam 0.5 mg daily at bedtime and an extra 1/2 tablet if needed for acute anxiety. She is tolerating medication well. She has tried several anxiolytic medications but they have not been well-tolerated. She denies depressed mood or suicidal thoughts. Feels "happy." She is planning on going to [facility] during Thanksgiving to stay with one of her sounds.   Insomnia:  Still has difficulty falling asleep but it has been better since gabapentin was added. Sleeping about 5 hours. It takes her a couple hours to fall asleep. She is tolerating gabapentin well, no side effects reported.  Chronic back pain:  Taking Tylenol a few times during the day and using icy hot patch. Zanaflex 4 mg at bedtime as needed is also helping.  She has an appointment with orthopedist in a few days, 08/06/2018.Marland Kitchen  No falls since her last visit. She is living in independent living and has no assistance for transfer.   Vit D deficiency: On Ergocalciferol 50,000 U, she is not taking medication daily because it seems to exacerbate headaches. 25 OH vit D in 01/2018 was 32.4.   Migraine headache: She is having about an episode per month. She  is requesting refill on the Maxalt, which she takes as needed and is still helping. She denies any new associated symptoms.  She is tolerating medication well, denies side effects. She follows with neurologist.  Review of Systems  Constitutional: Positive for fatigue. Negative for activity change, appetite change and fever.  HENT: Negative for mouth sores, nosebleeds and trouble swallowing.   Eyes: Negative for redness and visual disturbance.  Respiratory: Negative for cough, shortness of breath and wheezing.   Cardiovascular: Negative for chest pain, palpitations and leg swelling.  Gastrointestinal: Negative for abdominal pain, nausea and vomiting.       Negative for changes in bowel habits.  Endocrine: Negative for cold intolerance and heat intolerance.  Genitourinary: Negative for decreased urine volume, dysuria and hematuria.  Musculoskeletal: Positive for arthralgias and back pain. Negative for gait problem.  Allergic/Immunologic: Positive for environmental allergies.  Neurological: Positive for headaches. Negative for syncope, facial asymmetry and weakness.  Psychiatric/Behavioral: Positive for sleep disturbance. Negative for confusion. The patient is nervous/anxious.      Current Outpatient Medications on File Prior to Visit  Medication Sig Dispense Refill  . lansoprazole (PREVACID) 15 MG capsule 1 tab at 7:00 am (30 min before breakfast). 90 capsule 1  . Magnesium 250 MG TABS Take 250 mg by mouth daily. 2 PO DAILY    . Riboflavin 400 MG CAPS Take 1 capsule (400 mg total) by mouth daily.    Marland Kitchen tretinoin (  RETIN-A) 0.1 % cream Apply topically at bedtime. 45 g 2  . Turmeric 500 MG CAPS Take 1 capsule by mouth daily.    . Vitamin D, Ergocalciferol, (DRISDOL) 50000 units CAPS capsule 1 cap every 2 weeks. 12 capsule 3   No current facility-administered medications on file prior to visit.      Past Medical History:  Diagnosis Date  . Anxiety   . Arthritis   . Chronic insomnia  03/30/2015  . Chronic low back pain 12/02/2016  . Depression   . GERD (gastroesophageal reflux disease)   . Hiatal hernia   . High cholesterol   . Hypertelorism   . Hypertension   . Meningioma ([place])   . Migraine    Allergies  Allergen Reactions  . Dihydrotachysterol     Other reaction(s): Other (See Comments)  . Trazodone And Nefazodone Other (See Comments)    Worsens headaches  . Valium [Diazepam] Other (See Comments)    headaches  . Citalopram     Other reaction(s): Headache  . Other     "States all antidepressants cause migraines."    Social History   Socioeconomic History  . Marital status: Widowed    Spouse name: Not on file  . Number of children: 4  . Years of education: 49  . Highest education level: Not on file  Occupational History  . Occupation: Retired  Scientific laboratory technician  . Financial resource strain: Not on file  . Food insecurity:    Worry: Not on file    Inability: Not on file  . Transportation needs:    Medical: Not on file    Non-medical: Not on file  Tobacco Use  . Smoking status: Never Smoker  . Smokeless tobacco: Never Used  Substance and Sexual Activity  . Alcohol use: No  . Drug use: No  . Sexual activity: Not Currently  Lifestyle  . Physical activity:    Days per week: Not on file    Minutes per session: Not on file  . Stress: Not on file  Relationships  . Social connections:    Talks on phone: Not on file    Gets together: Not on file    Attends religious service: Not on file    Active member of club or organization: Not on file    Attends meetings of clubs or organizations: Not on file    Relationship status: Not on file  Other Topics Concern  . Not on file  Social History Narrative   Lives at home alone   Patient drinks 1 glass of tea daily.   Patient is right handed.    Vitals:   07/26/18 0931  BP: 122/72  Pulse: 62  Resp: 16  Temp: 97.6 F (36.4 C)  SpO2: 98%   Body mass index is 28.93 kg/m.      Physical Exam    Nursing note and vitals reviewed. Constitutional: She is oriented to person, place, and time. She appears well-developed and well-nourished. No distress.  HENT:  Head: Normocephalic and atraumatic.  Mouth/Throat: Oropharynx is clear and moist and mucous membranes are normal.  Eyes: Pupils are equal, round, and reactive to light. Conjunctivae are normal.  Cardiovascular: Normal rate and regular rhythm.  No murmur heard. Pulses:      Dorsalis pedis pulses are 2+ on the right side, and 2+ on the left side.  Respiratory: Effort normal and breath sounds normal. No respiratory distress.  GI: Soft. She exhibits no mass. There is no hepatomegaly. There  is no tenderness.  Musculoskeletal: She exhibits no edema.       Thoracic back: She exhibits deformity.  Kyphosis.  Lymphadenopathy:    She has no cervical adenopathy.  Neurological: She is alert and oriented to person, place, and time. She has normal strength. No cranial nerve deficit. Gait normal.  Skin: Skin is warm. No rash noted. No erythema.  Psychiatric: She has a normal mood and affect.  Well groomed, good eye contact.     ASSESSMENT AND PLAN:   [name] was seen today for 6 months follow-up.  Orders Placed This Encounter  Procedures  . Lipid panel  . VITAMIN D 25 Hydroxy (Vit-D Deficiency, Fractures)  . Comprehensive metabolic panel  . CBC   Lab Results  Component Value Date   WBC 5.7 07/26/2018   HGB 11.9 (L) 07/26/2018   HCT 35.6 (L) 07/26/2018   MCV 91.8 07/26/2018   PLT 240.0 07/26/2018   Lab Results  Component Value Date   CREATININE 1.33 (H) 07/26/2018   BUN 31 (H) 07/26/2018   NA 137 07/26/2018   K 4.9 07/26/2018   CL 101 07/26/2018   CO2 29 07/26/2018   Lab Results  Component Value Date   ALT 15 07/26/2018   AST 25 07/26/2018   ALKPHOS 52 07/26/2018   BILITOT 0.3 07/26/2018   Lab Results  Component Value Date   CHOL 211 (H) 07/26/2018   HDL 59.80 07/26/2018   LDLCALC 124 (H) 07/26/2018    TRIG 132.0 07/26/2018   CHOLHDL 4 07/26/2018     Essential hypertension BP adequately controlled. No changes in verapamil 180 mg daily. Continue low-salt diet. Eye exam is current. Follow-up in 6 months.  Generalized anxiety disorder Problem is stable. No changes in Xanax dose. She has not tolerated SSRIs or SNRIs in the past, they are rubbing migrans. We discussed some side effects of benzodiazepines in general. Follow-up in 6 months, before if needed.  Chronic low back pain In general problem has been a stable, still symptomatic. She has an appointment already scheduled with orthopedist. Continue Tylenol 500 mg 4 times per day and IcyHot patch.   Hyperlipidemia Continue nonpharmacologic treatment. Further recommendation will be given according to lipid panel results.  Migraine headache Problem is stable. No changes in current management. Follow-up in 6 to 12 months.  Vitamin D deficiency For now she will continue ergocalciferol 50,000 units monthly Further recommendation will be given according to 25 OH vitamin D results.  Chronic insomnia Problem improved since gabapentin was added, it has been stable since then. Continue gabapentin 300 mg at bedtime. Good sleep hygiene. Follow-up in 6 months.            -[name] was advised to return sooner than planned today if new concerns arise.       Damarri Rampy G. Martinique, MD  [facility]. [place] office.

## 2018-07-26 ENCOUNTER — Ambulatory Visit (INDEPENDENT_AMBULATORY_CARE_PROVIDER_SITE_OTHER): Payer: Medicare Other | Admitting: Family Medicine

## 2018-07-26 ENCOUNTER — Encounter: Payer: Self-pay | Admitting: Family Medicine

## 2018-07-26 VITALS — BP 122/72 | HR 62 | Temp 97.6°F | Resp 16 | Ht 61.0 in | Wt 153.1 lb

## 2018-07-26 DIAGNOSIS — G43709 Chronic migraine without aura, not intractable, without status migrainosus: Secondary | ICD-10-CM | POA: Diagnosis not present

## 2018-07-26 DIAGNOSIS — E782 Mixed hyperlipidemia: Secondary | ICD-10-CM | POA: Diagnosis not present

## 2018-07-26 DIAGNOSIS — I10 Essential (primary) hypertension: Secondary | ICD-10-CM

## 2018-07-26 DIAGNOSIS — F5104 Psychophysiologic insomnia: Secondary | ICD-10-CM | POA: Diagnosis not present

## 2018-07-26 DIAGNOSIS — F411 Generalized anxiety disorder: Secondary | ICD-10-CM

## 2018-07-26 DIAGNOSIS — G8929 Other chronic pain: Secondary | ICD-10-CM | POA: Diagnosis not present

## 2018-07-26 DIAGNOSIS — IMO0002 Reserved for concepts with insufficient information to code with codable children: Secondary | ICD-10-CM

## 2018-07-26 DIAGNOSIS — E559 Vitamin D deficiency, unspecified: Secondary | ICD-10-CM | POA: Diagnosis not present

## 2018-07-26 DIAGNOSIS — M545 Low back pain: Secondary | ICD-10-CM

## 2018-07-26 LAB — COMPREHENSIVE METABOLIC PANEL
ALBUMIN: 4 g/dL (ref 3.5–5.2)
ALT: 15 U/L (ref 0–35)
AST: 25 U/L (ref 0–37)
Alkaline Phosphatase: 52 U/L (ref 39–117)
BUN: 31 mg/dL — AB (ref 6–23)
CHLORIDE: 101 meq/L (ref 96–112)
CO2: 29 mEq/L (ref 19–32)
Calcium: 9.6 mg/dL (ref 8.4–10.5)
Creatinine, Ser: 1.33 mg/dL — ABNORMAL HIGH (ref 0.40–1.20)
GFR: 40.64 mL/min — ABNORMAL LOW (ref >60.00)
Glucose, Bld: 119 mg/dL — ABNORMAL HIGH (ref 70–99)
POTASSIUM: 4.9 meq/L (ref 3.5–5.1)
SODIUM: 137 meq/L (ref 135–145)
TOTAL PROTEIN: 6.6 g/dL (ref 6.0–8.3)
Total Bilirubin: 0.3 mg/dL (ref 0.2–1.2)

## 2018-07-26 LAB — LIPID PANEL
CHOL/HDL RATIO: 4
CHOLESTEROL: 211 mg/dL — AB (ref 0–200)
HDL: 59.8 mg/dL (ref >39.00)
LDL CALC: 124 mg/dL — AB (ref 0–99)
NonHDL: 150.75
TRIGLYCERIDES: 132 mg/dL (ref 0.0–149.0)
VLDL: 26.4 mg/dL (ref 0.0–40.0)

## 2018-07-26 LAB — VITAMIN D 25 HYDROXY (VIT D DEFICIENCY, FRACTURES): VITD: 27.33 ng/mL — AB (ref 30.00–100.00)

## 2018-07-26 LAB — CBC
HEMATOCRIT: 35.6 % — AB (ref 36.0–46.0)
Hemoglobin: 11.9 g/dL — ABNORMAL LOW (ref 12.0–15.0)
MCHC: 33.4 g/dL (ref 30.0–36.0)
MCV: 91.8 fl (ref 78.0–100.0)
PLATELETS: 240 10*3/uL (ref 150.0–400.0)
RBC: 3.88 Mil/uL (ref 3.87–5.11)
RDW: 12.9 % (ref 11.5–15.5)
WBC: 5.7 10*3/uL (ref 4.0–10.5)

## 2018-07-26 MED ORDER — TIZANIDINE HCL 4 MG PO TABS: 4.0000 mg | ORAL_TABLET | Freq: Two times a day (BID) | ORAL | 1 refills | Status: DC | PRN

## 2018-07-26 MED ORDER — GABAPENTIN 300 MG PO CAPS: 300.0000 mg | ORAL_CAPSULE | Freq: Every day | ORAL | 4 refills | Status: DC

## 2018-07-26 MED ORDER — MAXALT-MLT 10 MG PO TBDP: 10.0000 mg | ORAL_TABLET | ORAL | 3 refills | Status: DC | PRN

## 2018-07-26 MED ORDER — VERAPAMIL HCL ER 180 MG PO CP24: 180.0000 mg | ORAL_CAPSULE | Freq: Every day | ORAL | 2 refills | Status: DC

## 2018-07-26 NOTE — Assessment & Plan Note (Signed)
Problem improved since gabapentin was added, it has been stable since then. Continue gabapentin 300 mg at bedtime. Good sleep hygiene. Follow-up in 6 months.

## 2018-07-26 NOTE — Assessment & Plan Note (Signed)
Problem is stable. No changes in current management. Follow-up in 6 to 12 months.

## 2018-07-26 NOTE — Assessment & Plan Note (Signed)
In general problem has been a stable, still symptomatic. She has an appointment already scheduled with orthopedist. Continue Tylenol 500 mg 4 times per day and IcyHot patch.

## 2018-07-26 NOTE — Assessment & Plan Note (Signed)
Problem is stable. No changes in Xanax dose. She has not tolerated SSRIs or SNRIs in the past, they are rubbing migrans. We discussed some side effects of benzodiazepines in general. Follow-up in 6 months, before if needed.

## 2018-07-26 NOTE — Assessment & Plan Note (Signed)
Continue nonpharmacologic treatment. Further recommendation will be given according to lipid panel results. 

## 2018-07-26 NOTE — Assessment & Plan Note (Signed)
For now she will continue ergocalciferol 50,000 units monthly Further recommendation will be given according to 25 OH vitamin D results.

## 2018-07-26 NOTE — Patient Instructions (Addendum)
A few things to remember from today's visit:   Tracey Armstrong, today we have followed on some of your chronic medical problems and they seem to be stable, so no changes in current management today.  Review medication list and be sure it is accurate.  -Remember a healthy diet and regular physical activity are very important for prevention as well as for well being; they also help with many chronic problems, decreasing the need of adding new medications and delaying or preventing possible complications.  I will see you back in 6 months.  Remember to arrange your follow up appt before leaving today.  Please follow sooner than planned if a new concern arises.  Chronic insomnia  Generalized anxiety disorder  Essential hypertension  Vitamin D deficiency  Chronic midline low back pain without sciatica  Chronic low back pain - Plan: gabapentin (NEURONTIN) 300 MG capsule  Chronic migraine - Plan: MAXALT-MLT 10 MG disintegrating tablet  Essential (primary) hypertension - Plan: verapamil (VERELAN PM) 180 MG 24 hr capsule   Please be sure medication list is accurate. If a new problem present, please set up appointment sooner than planned today.

## 2018-07-27 ENCOUNTER — Other Ambulatory Visit: Payer: Self-pay | Admitting: Family Medicine

## 2018-07-27 ENCOUNTER — Encounter: Payer: Self-pay | Admitting: Family Medicine

## 2018-07-27 ENCOUNTER — Telehealth: Payer: Self-pay | Admitting: Family Medicine

## 2018-07-27 DIAGNOSIS — G43709 Chronic migraine without aura, not intractable, without status migrainosus: Secondary | ICD-10-CM

## 2018-07-27 DIAGNOSIS — IMO0002 Reserved for concepts with insufficient information to code with codable children: Secondary | ICD-10-CM

## 2018-07-27 MED ORDER — ALPRAZOLAM 0.5 MG PO TABS: ORAL_TABLET | ORAL | 2 refills | Status: DC

## 2018-07-27 MED ORDER — MAXALT-MLT 10 MG PO TBDP: 10.0000 mg | ORAL_TABLET | ORAL | 2 refills | Status: DC | PRN

## 2018-07-27 NOTE — Telephone Encounter (Signed)
Copied from [place] [id number]. Topic: Quick Communication - See Telephone Encounter >> Jul 27, 2018 12:05 PM Hewitt Shorts wrote: Pt is needing to get a refill on [facility]  [address]     Best number [phone]

## 2018-07-27 NOTE — Telephone Encounter (Signed)
Requested Prescriptions  Pending Prescriptions Disp Refills  . MAXALT-MLT 10 MG disintegrating tablet 12 tablet 2    Sig: Take 1 tablet (10 mg total) by mouth as needed for migraine. No more than 3 tabs per week.     Neurology:  Migraine Therapy - Triptan Passed - 07/27/2018 12:13 PM      Passed - Last BP in normal range    BP Readings from Last 1 Encounters:  07/26/18 122/72         Passed - Valid encounter within last 12 months    Recent Outpatient Visits          Yesterday Chronic insomnia   [place] [facility] [place], Malka So, MD   5 months ago Vitamin D deficiency   [facility] [place], Malka So, MD   9 months ago Chronic insomnia   [facility] [place], Malka So, MD   1 year ago Chronic insomnia   [facility] [place], Malka So, MD   1 year ago Chronic migraine   [place] [facility] [place], Malka So, MD      Future Appointments            Tomorrow Carlis Abbott, Gillermo Murdoch, PA-C [place]   In 5 months Wynetta Fines, Technical sales engineer at [facility]

## 2018-07-28 ENCOUNTER — Encounter: Payer: Self-pay | Admitting: *Deleted

## 2018-07-28 ENCOUNTER — Ambulatory Visit (INDEPENDENT_AMBULATORY_CARE_PROVIDER_SITE_OTHER): Payer: Medicare Other

## 2018-07-28 ENCOUNTER — Encounter (INDEPENDENT_AMBULATORY_CARE_PROVIDER_SITE_OTHER): Payer: Self-pay | Admitting: Physician Assistant

## 2018-07-28 ENCOUNTER — Ambulatory Visit (INDEPENDENT_AMBULATORY_CARE_PROVIDER_SITE_OTHER): Payer: Medicare Other | Admitting: Physician Assistant

## 2018-07-28 ENCOUNTER — Other Ambulatory Visit: Payer: Self-pay | Admitting: Neurology

## 2018-07-28 VITALS — Ht 60.0 in | Wt 150.0 lb

## 2018-07-28 DIAGNOSIS — M545 Low back pain, unspecified: Secondary | ICD-10-CM

## 2018-07-28 DIAGNOSIS — G8929 Other chronic pain: Secondary | ICD-10-CM

## 2018-07-28 MED ORDER — METHYLPREDNISOLONE 4 MG PO TBPK: ORAL_TABLET | ORAL | 0 refills | Status: DC

## 2018-07-28 NOTE — Progress Notes (Signed)
Office Visit Note   Patient: Tracey Armstrong           Date of Birth: Aug 06, 1935           MRN: [id number] Visit Date: 07/28/2018              Requested by: [place], [name], MD [address] [place], [place] [address] PCP: Martinique, [name], MD   Assessment & Plan: Visit Diagnoses:  1. Chronic left-sided low back pain, unspecified whether sciatica present     Plan: We will send her to formal physical therapy at [facility] for her back this is to include strengthening exercises both records back, stretching exercises, modalities and home exercise program.  Placed on a Medrol Dosepak.  She continues to have pain or if the pain becomes worse over the next 2 weeks she can always call the office order an MRI.  This point time we will cancel her appointment with Dr. Ernestina Patches to see what type of response she has to these conservative measures.  Like to see her back in a month to check her progress.  Questions were encouraged and answered  Follow-Up Instructions: Return in about 4 weeks (around 08/25/2018).   Orders:  Orders Placed This Encounter  Procedures  . XR Lumbar Spine 2-3 Views  . XR Pelvis 1-2 Views  . Ambulatory referral to Physical Therapy   Meds ordered this encounter  Medications  . methylPREDNISolone (MEDROL DOSEPAK) 4 MG TBPK tablet    Sig: Take as directed.    Dispense:  21 tablet    Refill:  0      Procedures: No procedures performed   Clinical Data: No additional findings.   Subjective: Chief Complaint  Patient presents with  . Lower Back - Pain  . Left Leg - Pain    HPI Ms. Sanluis returns today due to a new complaint of low back pain.  She was seen previously for left knee pain and had an injection which is helped.  Now complaining of low back pain radiates down her left leg.  She denies any numbness tingling.  Pain is down to the lateral aspect of the lower leg proximal portion.  She does have some waking pain occasionally.  She is unable to lay on  her left hip due to pain.  She notes low back pain.  No change in bowel bladder but increased frequency. Patient had a CT lumbar without contrast for suspected spinal leak in June 2017 this was read as the following changes in lower lumbar multilevel spondylosis most notably in the lumbar region at L4-5 L4-5 2 mm facet mediated slip annular old severe facet arthropathy left greater than right L5 nerve root impingement and bilateral foraminal narrowing without evidence of impingement.  L3-4 moderate stenosis which could affect either L4 nerve root.  Bilateral foraminal narrowing right greater than left L3 nerve root impingement.   Review of Systems See HPI  Objective: Vital Signs: Ht 5' (1.524 m)   Wt 150 lb (68 kg)   BMI 29.29 kg/m   Physical Exam  Constitutional: She is oriented to person, place, and time. She appears well-developed and well-nourished. No distress.  Cardiovascular: Intact distal pulses.  Pulmonary/Chest: Effort normal.  Neurological: She is alert and oriented to person, place, and time.  Skin: She is not diaphoretic.  Psychiatric: She has a normal mood and affect.    Ortho Exam She has tenderness over the lower lumbar left paraspinous region.  Tenderness over the  left trochanteric region.  She has good range of motion of bilateral hips without pain.  5 out of 5 strength throughout lower extremities except for left hip flexion against resistance which is 4 out of 5.  Deep tendon reflexes are 2+ at the knees and 1+ at the ankles and equal and symmetric.  Sensation grossly intact bilateral feet light touch. Specialty Comments:  No specialty comments available.  Imaging: Xr Lumbar Spine 2-3 Views  Result Date: 07/28/2018 Lumbar spine AP and lateral views.  Disc space overall well-maintained.  Facet arthritis of the lower lumbar spine.  Anterior spondylolisthesis of L4 on 5 grade 1.  Acute fractures.  No bony abnormalities otherwise per  Xr Pelvis 1-2 Views  Result Date:  07/28/2018 AP pelvis: Bilateral hips are well located.  No acute fracture.    PMFS History: Patient Active Problem List   Diagnosis Date Noted  . Knee osteoarthritis 02/01/2018  . Iron deficiency anemia 10/28/2017  . Rhinitis, allergic 07/27/2017  . GERD (gastroesophageal reflux disease) 06/16/2017  . Chronic pain disorder 06/16/2017  . Vitamin D deficiency 05/25/2017  . Hyperlipidemia 05/25/2017  . 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 Pain 02/26/2017  . Chronic low back pain 12/02/2016  . Abdominal pain 03/26/2016  . Essential (primary) hypertension 03/26/2016  . 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 Peptic esophagitis 03/26/2016  . Neuralgia neuritis, sciatic nerve 03/26/2016  . 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[place]) 06/20/2015  . Chronic insomnia 03/30/2015  . Chronic migraine 01/10/2015  . Depression 04/20/2014  . Generalized anxiety disorder 04/20/2014  . Chronic back pain 04/20/2014  . Chronic daily headache 04/20/2014  . Essential hypertension 04/20/2014  . Migraine headache 04/19/2014   Past Medical History:  Diagnosis Date  . Anxiety   . Arthritis   . Chronic insomnia 03/30/2015  . Chronic low back pain 12/02/2016  . Depression   . GERD (gastroesophageal reflux disease)   . Hiatal hernia   . High cholesterol   . Hypertelorism   . Hypertension   . Meningioma ([place])   . Migraine     Family History  Problem Relation Age of Onset  . Migraines Father   . Stroke Father   . Heart Problems Mother   . Epilepsy Brother     Past Surgical History:  Procedure Laterality Date  . STOMACH SURGERY     Social History   Occupational History  . Occupation: Retired  Tobacco Use  . Smoking status: Never Smoker  . Smokeless tobacco: Never Used  Substance and Sexual Activity  . Alcohol use: No  . Drug use: No  . Sexual activity: Not Currently

## 2018-08-03 DIAGNOSIS — M6281 Muscle weakness (generalized): Secondary | ICD-10-CM | POA: Diagnosis not present

## 2018-08-03 DIAGNOSIS — M545 Low back pain: Secondary | ICD-10-CM | POA: Diagnosis not present

## 2018-08-03 DIAGNOSIS — M4726 Other spondylosis with radiculopathy, lumbar region: Secondary | ICD-10-CM | POA: Diagnosis not present

## 2018-08-04 ENCOUNTER — Telehealth: Payer: Self-pay | Admitting: Family Medicine

## 2018-08-04 DIAGNOSIS — M6281 Muscle weakness (generalized): Secondary | ICD-10-CM | POA: Diagnosis not present

## 2018-08-04 DIAGNOSIS — M4726 Other spondylosis with radiculopathy, lumbar region: Secondary | ICD-10-CM | POA: Diagnosis not present

## 2018-08-04 DIAGNOSIS — M545 Low back pain: Secondary | ICD-10-CM | POA: Diagnosis not present

## 2018-08-04 NOTE — Telephone Encounter (Signed)
Copied from [place] [id number]. Topic: General - Other >> Aug 04, 2018  1:12 PM Lennox Solders wrote: Reason for CRM: samantha lpn [place] [facility] [place] is calling she fax over [facility] form on 07-26-18. Aldona [facility] is calling checking on the status of form

## 2018-08-04 NOTE — Telephone Encounter (Signed)
Pt needs name brand maxalt -mlt 10 mg . Pt needs [facility] . [facility]. Pt can not use generic due to it gives her vertigo

## 2018-08-04 NOTE — Telephone Encounter (Signed)
Prior auth for brand name Maxalt 10mg  sent to [email or web address]-key [id number].

## 2018-08-06 ENCOUNTER — Telehealth: Payer: Self-pay

## 2018-08-06 ENCOUNTER — Ambulatory Visit (INDEPENDENT_AMBULATORY_CARE_PROVIDER_SITE_OTHER): Payer: Medicare Other | Admitting: Physical Medicine and Rehabilitation

## 2018-08-06 DIAGNOSIS — M545 Low back pain: Secondary | ICD-10-CM | POA: Diagnosis not present

## 2018-08-06 DIAGNOSIS — M4726 Other spondylosis with radiculopathy, lumbar region: Secondary | ICD-10-CM | POA: Diagnosis not present

## 2018-08-06 DIAGNOSIS — M6281 Muscle weakness (generalized): Secondary | ICD-10-CM | POA: Diagnosis not present

## 2018-08-06 NOTE — Telephone Encounter (Signed)
Copied from [place] [id number]. Topic: Quick Communication - See Telephone Encounter >> Aug 06, 2018 11:27 AM Berneta Levins wrote: CRM for notification. See Telephone encounter for: 08/06/18.  Scott with [facility] calling to give approval for non-formulary exception request for Maxalt-Mlt 10mg  tablets.  Approved insurance coverage effective 08/04/2018 for one year.

## 2018-08-06 NOTE — Telephone Encounter (Signed)
See prior note

## 2018-08-06 NOTE — Telephone Encounter (Signed)
See prior note from [facility] that stated Scott from [facility] called with approval from 08/04/2018-1 year.  I called [facility] and left a detailed message with this info.

## 2018-08-06 NOTE — Telephone Encounter (Signed)
Form faxed as requested.

## 2018-08-09 DIAGNOSIS — M4726 Other spondylosis with radiculopathy, lumbar region: Secondary | ICD-10-CM | POA: Diagnosis not present

## 2018-08-09 DIAGNOSIS — M545 Low back pain: Secondary | ICD-10-CM | POA: Diagnosis not present

## 2018-08-09 DIAGNOSIS — M6281 Muscle weakness (generalized): Secondary | ICD-10-CM | POA: Diagnosis not present

## 2018-08-11 DIAGNOSIS — M545 Low back pain: Secondary | ICD-10-CM | POA: Diagnosis not present

## 2018-08-11 DIAGNOSIS — M6281 Muscle weakness (generalized): Secondary | ICD-10-CM | POA: Diagnosis not present

## 2018-08-11 DIAGNOSIS — M4726 Other spondylosis with radiculopathy, lumbar region: Secondary | ICD-10-CM | POA: Diagnosis not present

## 2018-08-13 DIAGNOSIS — M4726 Other spondylosis with radiculopathy, lumbar region: Secondary | ICD-10-CM | POA: Diagnosis not present

## 2018-08-13 DIAGNOSIS — M545 Low back pain: Secondary | ICD-10-CM | POA: Diagnosis not present

## 2018-08-13 DIAGNOSIS — M6281 Muscle weakness (generalized): Secondary | ICD-10-CM | POA: Diagnosis not present

## 2018-08-16 DIAGNOSIS — M4726 Other spondylosis with radiculopathy, lumbar region: Secondary | ICD-10-CM | POA: Diagnosis not present

## 2018-08-16 DIAGNOSIS — M545 Low back pain: Secondary | ICD-10-CM | POA: Diagnosis not present

## 2018-08-16 DIAGNOSIS — M6281 Muscle weakness (generalized): Secondary | ICD-10-CM | POA: Diagnosis not present

## 2018-08-18 DIAGNOSIS — M6281 Muscle weakness (generalized): Secondary | ICD-10-CM | POA: Diagnosis not present

## 2018-08-18 DIAGNOSIS — M545 Low back pain: Secondary | ICD-10-CM | POA: Diagnosis not present

## 2018-08-18 DIAGNOSIS — M4726 Other spondylosis with radiculopathy, lumbar region: Secondary | ICD-10-CM | POA: Diagnosis not present

## 2018-08-20 DIAGNOSIS — M4726 Other spondylosis with radiculopathy, lumbar region: Secondary | ICD-10-CM | POA: Diagnosis not present

## 2018-08-20 DIAGNOSIS — M6281 Muscle weakness (generalized): Secondary | ICD-10-CM | POA: Diagnosis not present

## 2018-08-20 DIAGNOSIS — M545 Low back pain: Secondary | ICD-10-CM | POA: Diagnosis not present

## 2018-08-25 ENCOUNTER — Encounter (INDEPENDENT_AMBULATORY_CARE_PROVIDER_SITE_OTHER): Payer: Self-pay | Admitting: Physician Assistant

## 2018-08-25 ENCOUNTER — Ambulatory Visit (INDEPENDENT_AMBULATORY_CARE_PROVIDER_SITE_OTHER): Payer: Medicare Other | Admitting: Physician Assistant

## 2018-08-25 DIAGNOSIS — M6281 Muscle weakness (generalized): Secondary | ICD-10-CM | POA: Diagnosis not present

## 2018-08-25 DIAGNOSIS — M1712 Unilateral primary osteoarthritis, left knee: Secondary | ICD-10-CM

## 2018-08-25 DIAGNOSIS — M545 Low back pain: Secondary | ICD-10-CM | POA: Diagnosis not present

## 2018-08-25 DIAGNOSIS — M5442 Lumbago with sciatica, left side: Secondary | ICD-10-CM

## 2018-08-25 DIAGNOSIS — M4726 Other spondylosis with radiculopathy, lumbar region: Secondary | ICD-10-CM | POA: Diagnosis not present

## 2018-08-25 DIAGNOSIS — G8929 Other chronic pain: Secondary | ICD-10-CM | POA: Diagnosis not present

## 2018-08-25 MED ORDER — METHYLPREDNISOLONE ACETATE 40 MG/ML IJ SUSP
40.0000 mg | INTRAMUSCULAR | Status: AC | PRN
  Administered 2018-08-25: 40 mg via INTRA_ARTICULAR

## 2018-08-25 MED ORDER — LIDOCAINE HCL 1 % IJ SOLN
3.0000 mL | INTRAMUSCULAR | Status: AC | PRN
  Administered 2018-08-25: 3 mL

## 2018-08-25 NOTE — Progress Notes (Signed)
Office Visit Note   Patient: Tracey Armstrong           Date of Birth: Feb 03, 1935           MRN: [id number] Visit Date: 08/25/2018              Requested by: Martinique, [name], MD [address] [place], [place] [address] PCP: [place], [name], MD   Assessment & Plan: Visit Diagnoses: No diagnosis found.  Plan: We will obtain an MRI of her lumbar spine to rule out HNP as source of her radiculopathy down the left leg.  Also the MRI will assist planning for epidural steroids injections in the lumbar spine.  See her back after the MRI to go over the results and discuss further treatment.  Follow-Up Instructions: Return for After MRI.   Orders:  No orders of the defined types were placed in this encounter.  No orders of the defined types were placed in this encounter.     Procedures: Large Joint Inj on 08/25/2018 6:23 PM Indications: pain Details: 22 G 1.5 in needle, anterolateral approach  Arthrogram: No  Medications: 3 mL lidocaine 1 %; 40 mg methylPREDNISolone acetate 40 MG/ML Outcome: tolerated well, no immediate complications Procedure, treatment alternatives, risks and benefits explained, specific risks discussed. Consent was given by the patient. Immediately prior to procedure a time out was called to verify the correct patient, procedure, equipment, support staff and site/side marked as required. Patient was prepped and draped in the usual sterile fashion.       Clinical Data: No additional findings.   Subjective: Chief Complaint  Patient presents with  . Lower Back - Follow-up    HPI [name] returns today follow-up of her lumbar spine.  She states that her back and hips really very painful.  She still having radicular pain down the left leg.  Questioning what the next that may be.  She did go to PT but felt that this made her back pain worse.  She is also requesting a repeat injection in her left knee last injection was given in August and she is done well  until recently.  She has had no new injury to her back or to her knee.  Review of Systems See HPI otherwise negative  Objective: Vital Signs: There were no vitals taken for this visit.  Physical Exam  Constitutional: She is oriented to person, place, and time. She appears well-developed and well-nourished. No distress.  Pulmonary/Chest: Effort normal.  Neurological: She is alert and oriented to person, place, and time.  Skin: She is not diaphoretic.  Psychiatric: She has a normal mood and affect.    Ortho Exam Left knee valgus deformity.  Tenderness over the lateral joint line slight tenderness over the medial joint line.  No instability valgus varus stressing.  No abnormal warmth erythema or effusion.  She has tight hamstrings bilaterally negative straight leg raise bilaterally. Specialty Comments:  No specialty comments available.  Imaging: No results found.   PMFS History: Patient Active Problem List   Diagnosis Date Noted  . Knee osteoarthritis 02/01/2018  . Iron deficiency anemia 10/28/2017  . Rhinitis, allergic 07/27/2017  . GERD (gastroesophageal reflux disease) 06/16/2017  . Chronic pain disorder 06/16/2017  . Vitamin D deficiency 05/25/2017  . Hyperlipidemia 05/25/2017  . 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 Pain 02/26/2017  . Chronic low back pain 12/02/2016  . Abdominal pain 03/26/2016  . Essential (primary) hypertension 03/26/2016  . 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  Peptic esophagitis 03/26/2016  . Neuralgia neuritis, sciatic nerve 03/26/2016  . 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[place]) 06/20/2015  . Chronic insomnia 03/30/2015  . Chronic migraine 01/10/2015  . Depression 04/20/2014  . Generalized anxiety disorder 04/20/2014  . Chronic back pain 04/20/2014  . Chronic daily headache 04/20/2014  . Essential hypertension 04/20/2014  . Migraine headache 04/19/2014   Past Medical History:  Diagnosis Date  . Anxiety   . Arthritis   . Chronic insomnia 03/30/2015  .  Chronic low back pain 12/02/2016  . Depression   . GERD (gastroesophageal reflux disease)   . Hiatal hernia   . High cholesterol   . Hypertelorism   . Hypertension   . Meningioma ([place])   . Migraine     Family History  Problem Relation Age of Onset  . Migraines Father   . Stroke Father   . Heart Problems Mother   . Epilepsy Brother     Past Surgical History:  Procedure Laterality Date  . STOMACH SURGERY     Social History   Occupational History  . Occupation: Retired  Tobacco Use  . Smoking status: Never Smoker  . Smokeless tobacco: Never Used  Substance and Sexual Activity  . Alcohol use: No  . Drug use: No  . Sexual activity: Not Currently

## 2018-08-27 ENCOUNTER — Other Ambulatory Visit (INDEPENDENT_AMBULATORY_CARE_PROVIDER_SITE_OTHER): Payer: Self-pay | Admitting: Physician Assistant

## 2018-08-27 ENCOUNTER — Other Ambulatory Visit (INDEPENDENT_AMBULATORY_CARE_PROVIDER_SITE_OTHER): Payer: Self-pay

## 2018-08-27 DIAGNOSIS — M6281 Muscle weakness (generalized): Secondary | ICD-10-CM | POA: Diagnosis not present

## 2018-08-27 DIAGNOSIS — M4726 Other spondylosis with radiculopathy, lumbar region: Secondary | ICD-10-CM | POA: Diagnosis not present

## 2018-08-27 DIAGNOSIS — M545 Low back pain: Secondary | ICD-10-CM | POA: Diagnosis not present

## 2018-08-27 DIAGNOSIS — G8929 Other chronic pain: Secondary | ICD-10-CM

## 2018-08-31 DIAGNOSIS — M545 Low back pain: Secondary | ICD-10-CM | POA: Diagnosis not present

## 2018-08-31 DIAGNOSIS — M4726 Other spondylosis with radiculopathy, lumbar region: Secondary | ICD-10-CM | POA: Diagnosis not present

## 2018-08-31 DIAGNOSIS — M6281 Muscle weakness (generalized): Secondary | ICD-10-CM | POA: Diagnosis not present

## 2018-09-01 DIAGNOSIS — M4726 Other spondylosis with radiculopathy, lumbar region: Secondary | ICD-10-CM | POA: Diagnosis not present

## 2018-09-01 DIAGNOSIS — M6281 Muscle weakness (generalized): Secondary | ICD-10-CM | POA: Diagnosis not present

## 2018-09-01 DIAGNOSIS — M545 Low back pain: Secondary | ICD-10-CM | POA: Diagnosis not present

## 2018-09-03 DIAGNOSIS — M545 Low back pain: Secondary | ICD-10-CM | POA: Diagnosis not present

## 2018-09-03 DIAGNOSIS — M6281 Muscle weakness (generalized): Secondary | ICD-10-CM | POA: Diagnosis not present

## 2018-09-03 DIAGNOSIS — M4726 Other spondylosis with radiculopathy, lumbar region: Secondary | ICD-10-CM | POA: Diagnosis not present

## 2018-09-06 DIAGNOSIS — M545 Low back pain: Secondary | ICD-10-CM | POA: Diagnosis not present

## 2018-09-06 DIAGNOSIS — M6281 Muscle weakness (generalized): Secondary | ICD-10-CM | POA: Diagnosis not present

## 2018-09-06 DIAGNOSIS — M4726 Other spondylosis with radiculopathy, lumbar region: Secondary | ICD-10-CM | POA: Diagnosis not present

## 2018-09-08 ENCOUNTER — Other Ambulatory Visit: Payer: Medicare Other

## 2018-09-08 DIAGNOSIS — M4726 Other spondylosis with radiculopathy, lumbar region: Secondary | ICD-10-CM | POA: Diagnosis not present

## 2018-09-08 DIAGNOSIS — M545 Low back pain: Secondary | ICD-10-CM | POA: Diagnosis not present

## 2018-09-08 DIAGNOSIS — M6281 Muscle weakness (generalized): Secondary | ICD-10-CM | POA: Diagnosis not present

## 2018-09-09 ENCOUNTER — Ambulatory Visit
Admission: RE | Admit: 2018-09-09 | Discharge: 2018-09-09 | Disposition: A | Discharge status: Home / Self Care | Payer: Medicare Other | Source: Ambulatory Visit | Attending: Physician Assistant | Admitting: Physician Assistant

## 2018-09-09 DIAGNOSIS — M5126 Other intervertebral disc displacement, lumbar region: Secondary | ICD-10-CM | POA: Diagnosis not present

## 2018-09-09 DIAGNOSIS — M545 Low back pain, unspecified: Secondary | ICD-10-CM

## 2018-09-09 DIAGNOSIS — M5127 Other intervertebral disc displacement, lumbosacral region: Secondary | ICD-10-CM | POA: Diagnosis not present

## 2018-09-09 DIAGNOSIS — M47816 Spondylosis without myelopathy or radiculopathy, lumbar region: Secondary | ICD-10-CM | POA: Diagnosis not present

## 2018-09-09 DIAGNOSIS — G8929 Other chronic pain: Secondary | ICD-10-CM

## 2018-09-09 DIAGNOSIS — M48061 Spinal stenosis, lumbar region without neurogenic claudication: Secondary | ICD-10-CM | POA: Diagnosis not present

## 2018-09-10 DIAGNOSIS — M6281 Muscle weakness (generalized): Secondary | ICD-10-CM | POA: Diagnosis not present

## 2018-09-10 DIAGNOSIS — M545 Low back pain: Secondary | ICD-10-CM | POA: Diagnosis not present

## 2018-09-10 DIAGNOSIS — M4726 Other spondylosis with radiculopathy, lumbar region: Secondary | ICD-10-CM | POA: Diagnosis not present

## 2018-09-13 ENCOUNTER — Encounter (INDEPENDENT_AMBULATORY_CARE_PROVIDER_SITE_OTHER): Payer: Self-pay | Admitting: Physician Assistant

## 2018-09-13 ENCOUNTER — Ambulatory Visit (INDEPENDENT_AMBULATORY_CARE_PROVIDER_SITE_OTHER): Payer: Medicare Other | Admitting: Physician Assistant

## 2018-09-13 DIAGNOSIS — M5442 Lumbago with sciatica, left side: Secondary | ICD-10-CM | POA: Diagnosis not present

## 2018-09-13 MED ORDER — CELECOXIB 200 MG PO CAPS: 200.0000 mg | ORAL_CAPSULE | Freq: Every day | ORAL | 1 refills | Status: DC

## 2018-09-13 NOTE — Progress Notes (Signed)
injes

## 2018-09-13 NOTE — Progress Notes (Signed)
Office Visit Note   Patient: Tracey Armstrong           Date of Birth: Aug 31, 1935           MRN: [id number] Visit Date: 09/13/2018              Requested by: [place]ique, [name], MD [address] [place], [place] [address] PCP: [place], [name], MD   Assessment & Plan: Visit Diagnoses:  1. Left-sided low back pain with left-sided sciatica, unspecified chronicity     Plan: We will send her for facet injection lumbar spine L4-5.  Have her follow-up with [place] in a month after the injection to see her response.  She did request pain medication would like a refill of Celebrex is due for her today.  Follow-Up Instructions: Return in about 4 weeks (around 10/11/2018).   Orders:  Orders Placed This Encounter  Procedures  . Epidural Steroid Injection - Lumbar/Sacral (Ancillary Performed)   Meds ordered this encounter  Medications  . DISCONTD: celecoxib (CELEBREX) 200 MG capsule    Sig: Take 1 capsule (200 mg total) by mouth daily.    Dispense:  30 capsule    Refill:  1  . celecoxib (CELEBREX) 200 MG capsule    Sig: Take 1 capsule (200 mg total) by mouth daily.    Dispense:  30 capsule    Refill:  1      Procedures: No procedures performed   Clinical Data: No additional findings.   Subjective: Chief Complaint  Patient presents with  . Lower Back - Follow-up    HPI Ms. Buckwalter returns today to go over the MRI of her lumbar spine.  She continues to have radicular pain down the left leg in an L4 distribution.  She states that the Medrol Dosepak definitely helped but once she came off the Medrol Dosepak the pain came back.  States that the back exercises actually cause her back pain to become worse. MRI of the lumbar spine dated 09/09/2018 showed anterior spondylolisthesis at L4-5 which is progressed since previous study.  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  L5-S1 moderate facet degeneration no significant  stenosis at this level.  L3-4 moderate facet hypertrophy mild spinal stenosis and mild subarticular stenosis bilaterally.  MRI images are reviewed with patient and a spine model was used for further visually. Review of Systems See HPI otherwise noncontributory.  Objective: Vital Signs: There were no vitals taken for this visit.  Physical Exam General well-developed well-nourished female no acute distress.  Ambulates without any assistive device. Psych: Alert and oriented x3. Ortho Exam  Specialty Comments:  No specialty comments available.  Imaging: No results found.   PMFS History: Patient Active Problem List   Diagnosis Date Noted  . Knee osteoarthritis 02/01/2018  . Iron deficiency anemia 10/28/2017  . Rhinitis, allergic 07/27/2017  . GERD (gastroesophageal reflux disease) 06/16/2017  . Chronic pain disorder 06/16/2017  . Vitamin D deficiency 05/25/2017  . Hyperlipidemia 05/25/2017  . 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 Pain 02/26/2017  . Chronic low back pain 12/02/2016  . Abdominal pain 03/26/2016  . Essential (primary) hypertension 03/26/2016  . 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 Peptic esophagitis 03/26/2016  . Neuralgia neuritis, sciatic nerve 03/26/2016  . 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[place]) 06/20/2015  . Chronic insomnia 03/30/2015  . Chronic migraine 01/10/2015  . Depression 04/20/2014  . Generalized anxiety disorder 04/20/2014  . Chronic back pain 04/20/2014  .  Chronic daily headache 04/20/2014  . Essential hypertension 04/20/2014  . Migraine headache 04/19/2014   Past Medical History:  Diagnosis Date  . Anxiety   . Arthritis   . Chronic insomnia 03/30/2015  . Chronic low back pain 12/02/2016  . Depression   . GERD (gastroesophageal reflux disease)   . Hiatal hernia   . High cholesterol   . Hypertelorism   . Hypertension   . Meningioma ([place])   . Migraine     Family History  Problem Relation Age of Onset  . Migraines Father   . Stroke Father   . Heart  Problems Mother   . Epilepsy Brother     Past Surgical History:  Procedure Laterality Date  . STOMACH SURGERY     Social History   Occupational History  . Occupation: Retired  Tobacco Use  . Smoking status: Never Smoker  . Smokeless tobacco: Never Used  Substance and Sexual Activity  . Alcohol use: No  . Drug use: No  . Sexual activity: Not Currently

## 2018-09-20 DIAGNOSIS — M4726 Other spondylosis with radiculopathy, lumbar region: Secondary | ICD-10-CM | POA: Diagnosis not present

## 2018-09-20 DIAGNOSIS — M545 Low back pain: Secondary | ICD-10-CM | POA: Diagnosis not present

## 2018-09-20 DIAGNOSIS — M6281 Muscle weakness (generalized): Secondary | ICD-10-CM | POA: Diagnosis not present

## 2018-09-21 ENCOUNTER — Ambulatory Visit (INDEPENDENT_AMBULATORY_CARE_PROVIDER_SITE_OTHER): Payer: Medicare Other | Admitting: Physical Medicine and Rehabilitation

## 2018-09-21 ENCOUNTER — Telehealth (INDEPENDENT_AMBULATORY_CARE_PROVIDER_SITE_OTHER): Payer: Self-pay | Admitting: *Deleted

## 2018-09-21 ENCOUNTER — Encounter (INDEPENDENT_AMBULATORY_CARE_PROVIDER_SITE_OTHER): Payer: Self-pay | Admitting: Physical Medicine and Rehabilitation

## 2018-09-21 ENCOUNTER — Ambulatory Visit (INDEPENDENT_AMBULATORY_CARE_PROVIDER_SITE_OTHER): Payer: Self-pay

## 2018-09-21 VITALS — BP 108/61 | HR 80 | Temp 98.1°F

## 2018-09-21 DIAGNOSIS — M545 Low back pain, unspecified: Secondary | ICD-10-CM

## 2018-09-21 DIAGNOSIS — G8929 Other chronic pain: Secondary | ICD-10-CM | POA: Diagnosis not present

## 2018-09-21 DIAGNOSIS — M47816 Spondylosis without myelopathy or radiculopathy, lumbar region: Secondary | ICD-10-CM | POA: Diagnosis not present

## 2018-09-21 MED ORDER — METHYLPREDNISOLONE ACETATE 80 MG/ML IJ SUSP
80.0000 mg | Freq: Once | INTRAMUSCULAR | Status: AC
  Administered 2018-09-21: 80 mg

## 2018-09-21 NOTE — Patient Instructions (Signed)

## 2018-09-21 NOTE — Procedures (Signed)
Lumbar Facet Joint Intra-Articular Injection(s) with Fluoroscopic Guidance  Patient: Tracey Armstrong      Date of Birth: [birth year] MRN: [id number] PCP: [place], [name], MD      Visit Date: 09/21/2018   Universal Protocol:    Date/Time: 09/21/2018  Consent Given By: the patient  Position: PRONE   Additional Comments: Vital signs were monitored before and after the procedure. Patient was prepped and draped in the usual sterile fashion. The correct patient, procedure, and site was verified.   Injection Procedure Details:  Procedure Site One Meds Administered:  Meds ordered this encounter  Medications  . methylPREDNISolone acetate (DEPO-MEDROL) injection 80 mg     Laterality: Left  Location/Site:  L4-L5  Needle size: 22 guage  Needle type: Spinal  Needle Placement: Articular  Findings:  -Comments: Excellent flow of contrast producing a partial arthrogram.  Procedure Details: The fluoroscope beam is vertically oriented in AP, and the inferior recess is visualized beneath the lower pole of the inferior apophyseal process, which represents the target point for needle insertion. When direct visualization is difficult the target point is located at the medial projection of the vertebral pedicle. The region overlying each aforementioned target is locally anesthetized with a 1 to 2 ml. volume of 1% Lidocaine without Epinephrine.   The spinal needle was inserted into each of the above mentioned facet joints using biplanar fluoroscopic guidance. A 0.25 to 0.5 ml. volume of Isovue-250 was injected and a partial facet joint arthrogram was obtained. A single spot film was obtained of the resulting arthrogram.    One to 1.25 ml of the steroid/anesthetic solution was then injected into each of the facet joints noted above.   Additional Comments:  The patient tolerated the procedure well Dressing: Band-Aid    Post-procedure details: Patient was observed during the  procedure. Post-procedure instructions were reviewed.  Patient left the clinic in stable condition.

## 2018-09-21 NOTE — Telephone Encounter (Signed)
See below

## 2018-09-21 NOTE — Progress Notes (Signed)
Tracey Armstrong - 83 y.o. female MRN [id number]  Date of birth: 05/26/1935  Office Visit Note: Visit Date: 09/21/2018 PCP: Martinique, [name], MD Referred by: Martinique, [name], MD  Subjective: Chief Complaint  Patient presents with  . Lower Back - Pain  . Left Leg - Pain   HPI:  Tracey Armstrong is a 83 y.o. female who comes in today For planned left L4-5 facet joint cyst aspiration and block as requested by Benita Stabile, P.A.-C.  Patient is having low back and left hip pain with prolonged standing better at rest.  This is been a chronic situation for many years with arthritic back pain.  She uses icy hot and Tylenol with some relief but not much.  She reports that bending and lifting make the pain much worse.  It is a 10 out of 10.  She is failed conservative care through [facility].  MRI was obtained showing facet joint cyst fairly large at L4-5 impacting the canal.  If she fails to get much relief with aspiration we would look at interlaminar epidural injection versus transforaminal injection for the radicular type pain.  ROS Otherwise per HPI.  Assessment & Plan: Visit Diagnoses:  1. Spondylosis without myelopathy or radiculopathy, lumbar region   2. Chronic left-sided low back pain without sciatica     Plan: No additional findings.   Meds & Orders:  Meds ordered this encounter  Medications  . methylPREDNISolone acetate (DEPO-MEDROL) injection 80 mg    Orders Placed This Encounter  Procedures  . Facet Injection  . XR C-ARM NO REPORT    Follow-up: Return in about 4 weeks (around 10/19/2018).   Procedures: No procedures performed  Lumbar Facet Joint Intra-Articular Injection(s) with Fluoroscopic Guidance  Patient: Tracey Armstrong      Date of Birth: July 14, 1935 MRN: [id number] PCP: Martinique, [name], MD      Visit Date: 09/21/2018   Universal Protocol:    Date/Time: 09/21/2018  Consent Given By: the patient  Position: PRONE   Additional Comments: Vital signs were  monitored before and after the procedure. Patient was prepped and draped in the usual sterile fashion. The correct patient, procedure, and site was verified.   Injection Procedure Details:  Procedure Site One Meds Administered:  Meds ordered this encounter  Medications  . methylPREDNISolone acetate (DEPO-MEDROL) injection 80 mg     Laterality: Left  Location/Site:  L4-L5  Needle size: 22 guage  Needle type: Spinal  Needle Placement: Articular  Findings:  -Comments: Excellent flow of contrast producing a partial arthrogram.  Procedure Details: The fluoroscope beam is vertically oriented in AP, and the inferior recess is visualized beneath the lower pole of the inferior apophyseal process, which represents the target point for needle insertion. When direct visualization is difficult the target point is located at the medial projection of the vertebral pedicle. The region overlying each aforementioned target is locally anesthetized with a 1 to 2 ml. volume of 1% Lidocaine without Epinephrine.   The spinal needle was inserted into each of the above mentioned facet joints using biplanar fluoroscopic guidance. A 0.25 to 0.5 ml. volume of Isovue-250 was injected and a partial facet joint arthrogram was obtained. A single spot film was obtained of the resulting arthrogram.    One to 1.25 ml of the steroid/anesthetic solution was then injected into each of the facet joints noted above.   Additional Comments:  The patient tolerated the procedure well Dressing: Band-Aid    Post-procedure details: Patient was  observed during the procedure. Post-procedure instructions were reviewed.  Patient left the clinic in stable condition.    Clinical History: MRI LUMBAR SPINE WITHOUT CONTRAST  TECHNIQUE: Multiplanar, multisequence MR imaging of the lumbar spine was performed. No intravenous contrast was administered.  COMPARISON:  Lumbar MRI 06/20/2016  FINDINGS: Segmentation:   Normal  Alignment: Mild retrolisthesis T12-L1, L1-2, L2-3. 5 mm anterolisthesis L4-5 has progressed in the interval.  Vertebrae:  Normal bone marrow.  Negative for fracture or mass.  Conus medullaris and cauda equina: Conus extends to the L2 level. Conus and cauda equina appear normal.  Paraspinal and other soft tissues: Negative for paraspinous mass or fluid collection. Retroperitoneal structures negative.  Disc levels:  T12-L1: Mild disc bulging without stenosis  L1-2: Small central disc protrusion unchanged. Mild disc and facet degeneration without significant stenosis  L2-3: Mild disc and moderate facet degeneration. No significant stenosis.  L3-4: Disc degeneration with diffuse disc bulging. Moderate facet hypertrophy. Mild spinal stenosis and mild subarticular stenosis bilaterally  L4-5: 4 mm anterolisthesis has progressed in the interval. Advanced facet degeneration. 4 mm synovial cyst on the left projecting into the subarticular zone and causing subarticular stenosis on the left. Moderate spinal stenosis. Moderate subarticular stenosis on the left could affect the left L5 nerve root.  L5-S1: Mild disc degeneration. Moderate facet degeneration. Small left-sided synovial cyst has improved in the interval. No significant stenosis.  IMPRESSION: Small central disc protrusion L1-2 unchanged  Mild spinal stenosis and mild subarticular stenosis bilaterally at L3-4  Progressive anterolisthesis L4-5. 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  Improvement in left-sided synovial cyst L5-S1.   Electronically Signed   By: Franchot Gallo M.D.   On: 09/09/2018 10:49     Objective:  VS:  HT:    WT:   BMI:     BP:108/61  HR:80bpm  TEMP:98.1 F (36.7 C)(Oral)  RESP:  Physical Exam  Constitutional: She is oriented to person, place, and time.  Musculoskeletal:  Patient ambulates  without aid.  She has no pain over the greater trochanters and good distal strength.  Neurological: She is alert and oriented to person, place, and time. She exhibits normal muscle tone. Coordination normal.    Ortho Exam Imaging: Xr C-arm No Report  Result Date: 09/21/2018 Please see Notes tab for imaging impression.

## 2018-09-21 NOTE — Progress Notes (Signed)
   Numeric Pain Rating Scale and Functional Assessment Average Pain 10   In the last MONTH (on 0-10 scale) has pain interfered with the following?  1. General activity like being  able to carry out your everyday physical activities such as walking, climbing stairs, carrying groceries, or moving a chair?  Rating(8)   +Driver, -BT, -Dye Allergies.  

## 2018-09-22 ENCOUNTER — Other Ambulatory Visit: Payer: Self-pay | Admitting: Family Medicine

## 2018-09-22 NOTE — Telephone Encounter (Signed)
That is fine she should continue home exercise program

## 2018-09-22 NOTE — Telephone Encounter (Signed)
Patient aware of below message

## 2018-09-23 ENCOUNTER — Telehealth (INDEPENDENT_AMBULATORY_CARE_PROVIDER_SITE_OTHER): Payer: Self-pay | Admitting: Orthopaedic Surgery

## 2018-09-23 NOTE — Telephone Encounter (Signed)
Returned call to patient left message for a return call concerning her knee

## 2018-09-24 ENCOUNTER — Other Ambulatory Visit: Payer: Medicare Other

## 2018-09-24 DIAGNOSIS — M545 Low back pain: Secondary | ICD-10-CM | POA: Diagnosis not present

## 2018-09-24 DIAGNOSIS — M6281 Muscle weakness (generalized): Secondary | ICD-10-CM | POA: Diagnosis not present

## 2018-09-24 DIAGNOSIS — M4726 Other spondylosis with radiculopathy, lumbar region: Secondary | ICD-10-CM | POA: Diagnosis not present

## 2018-09-25 DIAGNOSIS — L0231 Cutaneous abscess of buttock: Secondary | ICD-10-CM | POA: Diagnosis not present

## 2018-10-18 ENCOUNTER — Ambulatory Visit: Payer: Medicare Other | Admitting: Physician Assistant

## 2018-10-18 DIAGNOSIS — J019 Acute sinusitis, unspecified: Secondary | ICD-10-CM | POA: Diagnosis not present

## 2018-10-18 DIAGNOSIS — M79672 Pain in left foot: Secondary | ICD-10-CM | POA: Diagnosis not present

## 2018-10-21 ENCOUNTER — Encounter (INDEPENDENT_AMBULATORY_CARE_PROVIDER_SITE_OTHER): Payer: Self-pay | Admitting: Physical Medicine and Rehabilitation

## 2018-10-27 ENCOUNTER — Other Ambulatory Visit: Payer: Self-pay | Admitting: Family Medicine

## 2018-10-27 DIAGNOSIS — M545 Low back pain, unspecified: Secondary | ICD-10-CM

## 2018-10-27 DIAGNOSIS — G8929 Other chronic pain: Secondary | ICD-10-CM

## 2018-10-29 ENCOUNTER — Other Ambulatory Visit: Payer: Self-pay | Admitting: Family Medicine

## 2018-10-29 DIAGNOSIS — G43709 Chronic migraine without aura, not intractable, without status migrainosus: Secondary | ICD-10-CM

## 2018-10-29 DIAGNOSIS — IMO0002 Reserved for concepts with insufficient information to code with codable children: Secondary | ICD-10-CM

## 2018-11-01 ENCOUNTER — Ambulatory Visit (INDEPENDENT_AMBULATORY_CARE_PROVIDER_SITE_OTHER): Payer: Self-pay

## 2018-11-01 ENCOUNTER — Encounter (INDEPENDENT_AMBULATORY_CARE_PROVIDER_SITE_OTHER): Payer: Self-pay | Admitting: Physical Medicine and Rehabilitation

## 2018-11-01 ENCOUNTER — Ambulatory Visit (INDEPENDENT_AMBULATORY_CARE_PROVIDER_SITE_OTHER): Payer: Medicare Other | Admitting: Physical Medicine and Rehabilitation

## 2018-11-01 VITALS — BP 127/69 | HR 82 | Temp 98.2°F

## 2018-11-01 DIAGNOSIS — M5416 Radiculopathy, lumbar region: Secondary | ICD-10-CM

## 2018-11-01 MED ORDER — METHYLPREDNISOLONE ACETATE 80 MG/ML IJ SUSP
80.0000 mg | Freq: Once | INTRAMUSCULAR | Status: AC
  Administered 2018-11-01: 80 mg

## 2018-11-01 NOTE — Patient Instructions (Signed)

## 2018-11-01 NOTE — Progress Notes (Signed)
Tracey Armstrong - 84 y.o. female MRN [id number]  Date of birth: [birth year]  Office Visit Note: Visit Date: 11/01/2018 PCP: Martinique, [name], MD Referred by: Martinique, [name], MD  Subjective: Chief Complaint  Patient presents with  . Lower Back - Pain  . Left Thigh - Pain   HPI:  [name] is a 84 y.o. female who comes in today For planned L5-S1 interlaminar epidural steroid injection for worsening left buttock and leg pain.  Patient has L4-5 stenosis with facet arthropathy and facet joint cyst.  She has pretty significant facet arthritis at L4-5 and L5-S1.  Facet joint cyst injection and aspiration was attempted the last time we saw her a few weeks ago but this was not very beneficial.  Depending on relief would consider transforaminal injection versus repeat.  Patient might do well with more comprehensive pain management if needed.  She could be a surgical candidate for decompression laminectomy.  ROS Otherwise per HPI.  Assessment & Plan: Visit Diagnoses:  1. Lumbar radiculopathy     Plan: No additional findings.   Meds & Orders:  Meds ordered this encounter  Medications  . methylPREDNISolone acetate (DEPO-MEDROL) injection 80 mg    Orders Placed This Encounter  Procedures  . XR C-ARM NO REPORT  . Epidural Steroid injection    Follow-up: Return if symptoms worsen or fail to improve.   Procedures: No procedures performed  Lumbar Epidural Steroid Injection - Interlaminar Approach with Fluoroscopic Guidance  Patient: [name]      Date of Birth: 05/31/1935 MRN: [id number] PCP: Martinique, [name], MD      Visit Date: 11/01/2018   Universal Protocol:     Consent Given By: the patient  Position: PRONE  Additional Comments: Vital signs were monitored before and after the procedure. Patient was prepped and draped in the usual sterile fashion. The correct patient, procedure, and site was verified.   Injection Procedure Details:  Procedure Site One Meds  Administered:  Meds ordered this encounter  Medications  . methylPREDNISolone acetate (DEPO-MEDROL) injection 80 mg     Laterality: Left  Location/Site:  L5-S1  Needle size: 20 G  Needle type: Tuohy  Needle Placement: Paramedian epidural  Findings:   -Comments: Excellent flow of contrast into the epidural space.  Procedure Details: Using a paramedian approach from the side mentioned above, the region overlying the inferior lamina was localized under fluoroscopic visualization and the soft tissues overlying this structure were infiltrated with 4 ml. of 1% Lidocaine without Epinephrine. The Tuohy needle was inserted into the epidural space using a paramedian approach.   The epidural space was localized using loss of resistance along with lateral and bi-planar fluoroscopic views.  After negative aspirate for air, blood, and CSF, a 2 ml. volume of Isovue-250 was injected into the epidural space and the flow of contrast was observed. Radiographs were obtained for documentation purposes.    The injectate was administered into the level noted above.   Additional Comments:  The patient tolerated the procedure well Dressing: Band-Aid    Post-procedure details: Patient was observed during the procedure. Post-procedure instructions were reviewed.  Patient left the clinic in stable condition.   Clinical History: MRI LUMBAR SPINE WITHOUT CONTRAST  TECHNIQUE: Multiplanar, multisequence MR imaging of the lumbar spine was performed. No intravenous contrast was administered.  COMPARISON:  Lumbar MRI 06/20/2016  FINDINGS: Segmentation:  Normal  Alignment: Mild retrolisthesis T12-L1, L1-2, L2-3. 5 mm anterolisthesis L4-5 has progressed in the interval.  Vertebrae:  Normal bone marrow.  Negative for fracture or mass.  Conus medullaris and cauda equina: Conus extends to the L2 level. Conus and cauda equina appear normal.  Paraspinal and other soft tissues: Negative for  paraspinous mass or fluid collection. Retroperitoneal structures negative.  Disc levels:  T12-L1: Mild disc bulging without stenosis  L1-2: Small central disc protrusion unchanged. Mild disc and facet degeneration without significant stenosis  L2-3: Mild disc and moderate facet degeneration. No significant stenosis.  L3-4: Disc degeneration with diffuse disc bulging. Moderate facet hypertrophy. Mild spinal stenosis and mild subarticular stenosis bilaterally  L4-5: 4 mm anterolisthesis has progressed in the interval. Advanced facet degeneration. 4 mm synovial cyst on the left projecting into the subarticular zone and causing subarticular stenosis on the left. Moderate spinal stenosis. Moderate subarticular stenosis on the left could affect the left L5 nerve root.  L5-S1: Mild disc degeneration. Moderate facet degeneration. Small left-sided synovial cyst has improved in the interval. No significant stenosis.  IMPRESSION: Small central disc protrusion L1-2 unchanged  Mild spinal stenosis and mild subarticular stenosis bilaterally at L3-4  Progressive anterolisthesis L4-5. 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  Improvement in left-sided synovial cyst L5-S1.   Electronically Signed   By: Franchot Gallo M.D.   On: 09/09/2018 10:49     Objective:  VS:  HT:    WT:   BMI:     BP:127/69  HR:82bpm  TEMP:98.2 F (36.8 C)(Oral)  RESP:  Physical Exam  Ortho Exam Imaging: Xr C-arm No Report  Result Date: 11/01/2018 Please see Notes tab for imaging impression.

## 2018-11-01 NOTE — Procedures (Signed)
Lumbar Epidural Steroid Injection - Interlaminar Approach with Fluoroscopic Guidance  Patient: Tracey Armstrong      Date of Birth: Apr 03, 1935 MRN: [id number] PCP: Martinique, [name], MD      Visit Date: 11/01/2018   Universal Protocol:     Consent Given By: the patient  Position: PRONE  Additional Comments: Vital signs were monitored before and after the procedure. Patient was prepped and draped in the usual sterile fashion. The correct patient, procedure, and site was verified.   Injection Procedure Details:  Procedure Site One Meds Administered:  Meds ordered this encounter  Medications  . methylPREDNISolone acetate (DEPO-MEDROL) injection 80 mg     Laterality: Left  Location/Site:  L5-S1  Needle size: 20 G  Needle type: Tuohy  Needle Placement: Paramedian epidural  Findings:   -Comments: Excellent flow of contrast into the epidural space.  Procedure Details: Using a paramedian approach from the side mentioned above, the region overlying the inferior lamina was localized under fluoroscopic visualization and the soft tissues overlying this structure were infiltrated with 4 ml. of 1% Lidocaine without Epinephrine. The Tuohy needle was inserted into the epidural space using a paramedian approach.   The epidural space was localized using loss of resistance along with lateral and bi-planar fluoroscopic views.  After negative aspirate for air, blood, and CSF, a 2 ml. volume of Isovue-250 was injected into the epidural space and the flow of contrast was observed. Radiographs were obtained for documentation purposes.    The injectate was administered into the level noted above.   Additional Comments:  The patient tolerated the procedure well Dressing: Band-Aid    Post-procedure details: Patient was observed during the procedure. Post-procedure instructions were reviewed.  Patient left the clinic in stable condition.

## 2018-11-01 NOTE — Progress Notes (Signed)
 .  Numeric Pain Rating Scale and Functional Assessment Average Pain 8   In the last MONTH (on 0-10 scale) has pain interfered with the following?  1. General activity like being  able to carry out your everyday physical activities such as walking, climbing stairs, carrying groceries, or moving a chair?  Rating(6)   +Driver, -BT, -Dye Allergies.  

## 2018-11-03 ENCOUNTER — Other Ambulatory Visit: Payer: Self-pay | Admitting: Family Medicine

## 2018-11-03 DIAGNOSIS — F411 Generalized anxiety disorder: Secondary | ICD-10-CM

## 2018-11-03 DIAGNOSIS — F5104 Psychophysiologic insomnia: Secondary | ICD-10-CM

## 2018-11-10 ENCOUNTER — Encounter (INDEPENDENT_AMBULATORY_CARE_PROVIDER_SITE_OTHER): Payer: Self-pay | Admitting: Physician Assistant

## 2018-11-10 ENCOUNTER — Ambulatory Visit (INDEPENDENT_AMBULATORY_CARE_PROVIDER_SITE_OTHER): Payer: Medicare Other | Admitting: Physician Assistant

## 2018-11-10 DIAGNOSIS — M1712 Unilateral primary osteoarthritis, left knee: Secondary | ICD-10-CM | POA: Diagnosis not present

## 2018-11-10 DIAGNOSIS — M5442 Lumbago with sciatica, left side: Secondary | ICD-10-CM | POA: Diagnosis not present

## 2018-11-10 DIAGNOSIS — G8929 Other chronic pain: Secondary | ICD-10-CM | POA: Diagnosis not present

## 2018-11-10 MED ORDER — CELECOXIB 200 MG PO CAPS: 200.0000 mg | ORAL_CAPSULE | Freq: Every day | ORAL | 1 refills | Status: DC

## 2018-11-10 NOTE — Progress Notes (Signed)
HPI: Tracey Armstrong returns today requesting a repeat injection left knee.  She states last shot which was given August 25, 2018 helped.  She states she is having more pain in her back than anything.  She underwent an interlaminar injection on the left at L5-S1 on 11/01/2018 she states this injection gave her no relief.  She states her back is what bothers her the most.  However she is not wanting to see a neurosurgeon for possible intervention.  Apparently she had a family member who had a bad experience with lumbar surgery and she is reluctant to entertain any surgery on her back.  Physical exam: Left knee good range of motion.  No abnormal warmth erythema or effusion.  Valgus deformities knees present.  Impression: Left knee osteoarthritis Lumbar radiculopathy left leg  Plan: We will see her back early February for repeat left knee injection.  Place her on Celebrex 200 mg once daily.  She can continue her Tylenol as needed for back pain.  She is failed therapy and has had little relief with steroid injection lumbar spine.  Due to the fact that she is reluctant to see a spinal surgeon I really have nothing else to offer her.  Questions were encouraged and answered.

## 2018-11-17 ENCOUNTER — Telehealth (INDEPENDENT_AMBULATORY_CARE_PROVIDER_SITE_OTHER): Payer: Self-pay | Admitting: Physician Assistant

## 2018-11-17 ENCOUNTER — Other Ambulatory Visit (INDEPENDENT_AMBULATORY_CARE_PROVIDER_SITE_OTHER): Payer: Self-pay | Admitting: Orthopaedic Surgery

## 2018-11-17 MED ORDER — METHYLPREDNISOLONE 4 MG PO TABS: ORAL_TABLET | ORAL | 0 refills | Status: DC

## 2018-11-17 NOTE — Telephone Encounter (Signed)
Please advise 

## 2018-11-17 NOTE — Telephone Encounter (Signed)
Patient called advised her back and hip is hurting her really bad. Patient asked if Artis Delay thinks that prednisone will help her. The number to contact patient is [phone] or [phone]

## 2018-11-17 NOTE — Telephone Encounter (Signed)
I sent in a steroid pack for her.

## 2018-11-17 NOTE — Telephone Encounter (Signed)
Patient aware this was called in for her  

## 2018-11-19 ENCOUNTER — Other Ambulatory Visit: Payer: Self-pay | Admitting: Family Medicine

## 2018-11-19 DIAGNOSIS — M545 Low back pain, unspecified: Secondary | ICD-10-CM

## 2018-11-19 DIAGNOSIS — G8929 Other chronic pain: Secondary | ICD-10-CM

## 2018-12-01 ENCOUNTER — Ambulatory Visit (INDEPENDENT_AMBULATORY_CARE_PROVIDER_SITE_OTHER): Payer: Medicare Other | Admitting: Physician Assistant

## 2018-12-01 ENCOUNTER — Encounter (INDEPENDENT_AMBULATORY_CARE_PROVIDER_SITE_OTHER): Payer: Self-pay | Admitting: Physician Assistant

## 2018-12-01 ENCOUNTER — Telehealth (INDEPENDENT_AMBULATORY_CARE_PROVIDER_SITE_OTHER): Payer: Self-pay | Admitting: *Deleted

## 2018-12-01 DIAGNOSIS — M1712 Unilateral primary osteoarthritis, left knee: Secondary | ICD-10-CM

## 2018-12-01 MED ORDER — METHYLPREDNISOLONE ACETATE 40 MG/ML IJ SUSP
40.0000 mg | INTRAMUSCULAR | Status: AC | PRN
  Administered 2018-12-01: 40 mg via INTRA_ARTICULAR

## 2018-12-01 MED ORDER — LIDOCAINE HCL 1 % IJ SOLN
3.0000 mL | INTRAMUSCULAR | Status: AC | PRN
  Administered 2018-12-01: 3 mL

## 2018-12-01 NOTE — Progress Notes (Signed)
   Procedure Note  Patient: Tracey Armstrong             Date of Birth: 17-Sep-1935           MRN: [id number]             Visit Date: 12/01/2018  HPI: Mrs.Hodge comes in today requesting injection in her left knee.  She has known osteoarthritis left knee.  She is also asking about her left heel which she is having pain in whenever she begins to walk in the morning first thing.  She has had no new injury to the knee or the foot.  Physical exam: Left foot she has tenderness at medial tubercle of the calcaneus.  Nontender over the posterior tibial tendon.  Inversion of the left foot against resistance reveals about a 5 strength without pain.  Pes planus deformity bilateral feet.  Left knee: Full extension full flexion.  No effusion or abnormal warmth.  Left calf supple nontender.  Procedures: Visit Diagnoses: Primary osteoarthritis of left knee  Large Joint Inj: L knee on 12/01/2018 1:50 PM Indications: pain Details: 22 G 1.5 in needle, anterolateral approach  Arthrogram: No  Medications: 3 mL lidocaine 1 %; 40 mg methylPREDNISolone acetate 40 MG/ML Outcome: tolerated well, no immediate complications Procedure, treatment alternatives, risks and benefits explained, specific risks discussed. Consent was given by the patient. Immediately prior to procedure a time out was called to verify the correct patient, procedure, equipment, support staff and site/side marked as required. Patient was prepped and draped in the usual sterile fashion.    Plan: Discussed with her shoe wear she needs to change her shoes out daily.  She benefit from off-the-shelf orthotic with a medial hindfoot wedge.  I have written this down for her.  She is also to work on gastrocsoleus stretching exercises as discussed.  Regards to her knee she understands that she needs to wait at least 3 months between injections.  Follow-up PRN.

## 2018-12-01 NOTE — Telephone Encounter (Signed)
Options would be L4 TF esi, surgical consultation for decompression.

## 2018-12-02 NOTE — Telephone Encounter (Signed)
Pt is scheduled for 12/14/2018 at 1515 with driver.

## 2018-12-06 ENCOUNTER — Ambulatory Visit (INDEPENDENT_AMBULATORY_CARE_PROVIDER_SITE_OTHER): Payer: Medicare Other | Admitting: Physician Assistant

## 2018-12-14 ENCOUNTER — Ambulatory Visit (INDEPENDENT_AMBULATORY_CARE_PROVIDER_SITE_OTHER): Payer: Self-pay

## 2018-12-14 ENCOUNTER — Encounter (INDEPENDENT_AMBULATORY_CARE_PROVIDER_SITE_OTHER): Payer: Self-pay | Admitting: Physical Medicine and Rehabilitation

## 2018-12-14 ENCOUNTER — Ambulatory Visit (INDEPENDENT_AMBULATORY_CARE_PROVIDER_SITE_OTHER): Payer: Medicare Other | Admitting: Physical Medicine and Rehabilitation

## 2018-12-14 VITALS — BP 149/81 | HR 72 | Temp 98.7°F

## 2018-12-14 DIAGNOSIS — M7062 Trochanteric bursitis, left hip: Secondary | ICD-10-CM

## 2018-12-14 DIAGNOSIS — G894 Chronic pain syndrome: Secondary | ICD-10-CM

## 2018-12-14 DIAGNOSIS — M5416 Radiculopathy, lumbar region: Secondary | ICD-10-CM | POA: Diagnosis not present

## 2018-12-14 DIAGNOSIS — M4316 Spondylolisthesis, lumbar region: Secondary | ICD-10-CM

## 2018-12-14 DIAGNOSIS — M48062 Spinal stenosis, lumbar region with neurogenic claudication: Secondary | ICD-10-CM

## 2018-12-14 MED ORDER — BETAMETHASONE SOD PHOS & ACET 6 (3-3) MG/ML IJ SUSP: 12.0000 mg | Freq: Once | INTRAMUSCULAR | Status: DC

## 2018-12-14 NOTE — Progress Notes (Signed)
Tracey Armstrong - 84 y.o. female MRN [id number]  Date of birth: 04-03-1935  Office Visit Note: Visit Date: 12/14/2018 PCP: Martinique, [name], MD Referred by: Martinique, [name], MD  Subjective: No chief complaint on file.  HPI: Tracey Armstrong is a 84 y.o. female who comes in today For planned left L4 or L5 transforaminal epidural steroid injection.  Unfortunately the patient's anxiety level and amount of pain is really making the treatment difficult from a standpoint of trying to reason with her on the source of her pain and the usual treatment pattern for this.  She is also followed by Dr. Ninfa Linden as well as Benita Stabile, P.A.-C.  She does have ongoing knee pain that is managed with cortisone injection.  Her biggest issue is left lower back pain upper buttock pain referred into the lateral hip over the greater trochanter.  She reports the pain is 10 out of 10 and constant.  Is worse with standing and ambulating.  We do have MRI of the lumbar spine and this is reviewed again with the patient that show significant listhesis of L4 on L5 with significant facet arthritis and arthropathy with facet joint cyst impacting the lateral recess.  Prior facet joint block was not beneficial although reviewing fluoroscopic images with the patient today shows excellent placement.  Lumbar epidural injection from an interlaminar approach was not very beneficial at all.  I understand her level of frustration but today when I try to explain the rationale for a transforaminal approach all she really wants is a "cortisone injection".  She does not want to undergo any type of lumbar surgery as someone in her family had a really bad outcome with a prior surgery.  Again her anxiety issues are an underlying problem that do exacerbate the pain level.  She currently takes tizanidine as well as gabapentin and tumeric.  She takes Xanax for some anxiety.  She has not had focal weakness or bowel or bladder changes or any new trauma.  Review of  Systems  Constitutional: Negative for chills, fever, malaise/fatigue and weight loss.  HENT: Negative for hearing loss and sinus pain.   Eyes: Negative for blurred vision, double vision and photophobia.  Respiratory: Negative for cough and shortness of breath.   Cardiovascular: Negative for chest pain, palpitations and leg swelling.  Gastrointestinal: Negative for abdominal pain, nausea and vomiting.  Genitourinary: Negative for flank pain.  Musculoskeletal: Positive for back pain. Negative for myalgias.       Left hip and leg pain  Skin: Negative for itching and rash.  Neurological: Negative for tremors, focal weakness and weakness.  Endo/Heme/Allergies: Negative.   Psychiatric/Behavioral: Negative for depression. The patient is nervous/anxious.   All other systems reviewed and are negative.  Otherwise per HPI.  Assessment & Plan: Visit Diagnoses:  1. Lumbar radiculopathy   2. Greater trochanteric bursitis of left hip   3. Spondylolisthesis of lumbar region   4. Spinal stenosis of lumbar region with neurogenic claudication   5. Chronic pain syndrome     Plan: Findings:  Chronic worsening severe left low back with pain referring into the lateral hip over the greater trochanter.  I feel like this has to be related to the L4-5 situation of her lumbar spine.  Unfortunately we have not gotten much relief with the injections I think her expectations are modified by level of anxiety she does suffer from generalized anxiety.  We wanted to do a transforaminal approach today to see if that would do  better and we do see this on a lot of occasions where it will get better with 1 injection and not a separate injection which is very similar and I tried to explain that to her.  She does just want a "cortisone shot ".  We did provide a greater trochanteric injection today as well as a localized muscle injection over the quadratus lumborum of the left lower back.  Depending on relief would look at  transforaminal epidural steroid injection or possibly have Benita Stabile refer her to chronic pain management where hopefully she can get coping strategies and medication.  Would consider try to working with her myself on different medications but I think it would be difficult in the setting we do still have the resources.  She would be a surgical candidate given the severity of the spine however she is 84 years old and really does not desire to do this.  It probably would be a lumbar fusion at L4-5.    Meds & Orders:  Meds ordered this encounter  Medications  . DISCONTD: betamethasone acetate-betamethasone sodium phosphate (CELESTONE) injection 12 mg    Orders Placed This Encounter  Procedures  . Large Joint Inj: L greater trochanter    Follow-up: Return if symptoms worsen or fail to improve, for Consider Left L5 transforaminal epidural injection.   Procedures: Large Joint Inj: L greater trochanter on 12/14/2018 4:10 PM Indications: pain and diagnostic evaluation Details: 22 G 3.5 in needle, lateral approach  Arthrogram: No  Medications: 4 mL lidocaine 2 %; 80 mg triamcinolone acetonide 40 MG/ML; 4 mL bupivacaine 0.25 % Outcome: tolerated well, no immediate complications  Greatest area of pain over the greater trochanter was palpated and marked prior to injection. The patient did seem to have relief after the injection. Procedure, treatment alternatives, risks and benefits explained, specific risks discussed. Consent was given by the patient. Immediately prior to procedure a time out was called to verify the correct patient, procedure, equipment, support staff and site/side marked as required. Patient was prepped and draped in the usual sterile fashion.      No notes on file   Clinical History: MRI LUMBAR SPINE WITHOUT CONTRAST  TECHNIQUE: Multiplanar, multisequence MR imaging of the lumbar spine was performed. No intravenous contrast was administered.  COMPARISON:  Lumbar MRI  06/20/2016  FINDINGS: Segmentation:  Normal  Alignment: Mild retrolisthesis T12-L1, L1-2, L2-3. 5 mm anterolisthesis L4-5 has progressed in the interval.  Vertebrae:  Normal bone marrow.  Negative for fracture or mass.  Conus medullaris and cauda equina: Conus extends to the L2 level. Conus and cauda equina appear normal.  Paraspinal and other soft tissues: Negative for paraspinous mass or fluid collection. Retroperitoneal structures negative.  Disc levels:  T12-L1: Mild disc bulging without stenosis  L1-2: Small central disc protrusion unchanged. Mild disc and facet degeneration without significant stenosis  L2-3: Mild disc and moderate facet degeneration. No significant stenosis.  L3-4: Disc degeneration with diffuse disc bulging. Moderate facet hypertrophy. Mild spinal stenosis and mild subarticular stenosis bilaterally  L4-5: 4 mm anterolisthesis has progressed in the interval. Advanced facet degeneration. 4 mm synovial cyst on the left projecting into the subarticular zone and causing subarticular stenosis on the left. Moderate spinal stenosis. Moderate subarticular stenosis on the left could affect the left L5 nerve root.  L5-S1: Mild disc degeneration. Moderate facet degeneration. Small left-sided synovial cyst has improved in the interval. No significant stenosis.  IMPRESSION: Small central disc protrusion L1-2 unchanged  Mild spinal stenosis and  mild subarticular stenosis bilaterally at L3-4  Progressive anterolisthesis L4-5. 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  Improvement in left-sided synovial cyst L5-S1.   Electronically Signed   By: Franchot Gallo M.D.   On: 09/09/2018 10:49   She reports that she has never smoked. She has never used smokeless tobacco. No results for input(s): HGBA1C, LABURIC in the last 8760 hours.  Objective:  VS:  HT:    WT:   BMI:      BP:(!) 149/81  HR:72bpm  TEMP:98.7 F (37.1 C)(Oral)  RESP:  Physical Exam Vitals signs and nursing note reviewed.  Constitutional:      General: She is not in acute distress.    Appearance: Normal appearance. She is well-developed. She is obese.  HENT:     Head: Normocephalic and atraumatic.     Nose: Nose normal.     Mouth/Throat:     Mouth: Mucous membranes are moist.     Pharynx: Oropharynx is clear.  Eyes:     Conjunctiva/sclera: Conjunctivae normal.     Pupils: Pupils are equal, round, and reactive to light.  Neck:     Musculoskeletal: Normal range of motion and neck supple.  Cardiovascular:     Rate and Rhythm: Regular rhythm.  Pulmonary:     Effort: Pulmonary effort is normal. No respiratory distress.  Abdominal:     General: There is no distension.     Palpations: Abdomen is soft.     Tenderness: There is no guarding.  Musculoskeletal:     Right lower leg: No edema.     Left lower leg: No edema.     Comments: Patient ambulates without aid.  She is slow to rise from a seated position to full extension.  She does have back pain with extension and facet loading.  She has pain over the left greater trochanter and not the right.  She has some pain over the left PSIS and not the right.  She has good distal strength without clonus.  Skin:    General: Skin is warm and dry.     Findings: No erythema or rash.  Neurological:     General: No focal deficit present.     Mental Status: She is alert and oriented to person, place, and time.     Motor: No weakness or abnormal muscle tone.     Coordination: Coordination normal.     Gait: Gait abnormal.  Psychiatric:        Mood and Affect: Mood normal.        Behavior: Behavior normal.        Thought Content: Thought content normal.     Comments: Patient very anxious really having poor judgment at times do to the level of pain and frustration she is having.     Ortho Exam Imaging: No results found.  Past  Medical/Family/Surgical/Social History: Medications & Allergies reviewed per EMR, new medications updated. Patient Active Problem List   Diagnosis Date Noted  . Knee osteoarthritis 02/01/2018  . Iron deficiency anemia 10/28/2017  . Rhinitis, allergic 07/27/2017  . GERD (gastroesophageal reflux disease) 06/16/2017  . Chronic pain disorder 06/16/2017  . Vitamin D deficiency 05/25/2017  . Hyperlipidemia 05/25/2017  . 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 Pain 02/26/2017  . Chronic low back pain 12/02/2016  . Abdominal pain 03/26/2016  . Essential (primary) hypertension 03/26/2016  . 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 Peptic esophagitis 03/26/2016  .  Neuralgia neuritis, sciatic nerve 03/26/2016  . 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[place]) 06/20/2015  . Chronic insomnia 03/30/2015  . Chronic migraine 01/10/2015  . Depression 04/20/2014  . Generalized anxiety disorder 04/20/2014  . Chronic back pain 04/20/2014  . Chronic daily headache 04/20/2014  . Essential hypertension 04/20/2014  . Migraine headache 04/19/2014   Past Medical History:  Diagnosis Date  . Anxiety   . Arthritis   . Chronic insomnia 03/30/2015  . Chronic low back pain 12/02/2016  . Depression   . GERD (gastroesophageal reflux disease)   . Hiatal hernia   . High cholesterol   . Hypertelorism   . Hypertension   . Meningioma ([place])   . Migraine    Family History  Problem Relation Age of Onset  . Migraines Father   . Stroke Father   . Heart Problems Mother   . Epilepsy Brother    Past Surgical History:  Procedure Laterality Date  . STOMACH SURGERY     Social History   Occupational History  . Occupation: Retired  Tobacco Use  . Smoking status: Never Smoker  . Smokeless tobacco: Never Used  Substance and Sexual Activity  . Alcohol use: No  . Drug use: No  . Sexual activity: Not Currently

## 2018-12-14 NOTE — Progress Notes (Signed)
   Numeric Pain Rating Scale and Functional Assessment Average Pain 10   In the last MONTH (on 0-10 scale) has pain interfered with the following?  1. General activity like being  able to carry out your everyday physical activities such as walking, climbing stairs, carrying groceries, or moving a chair?  Rating(8)   +Driver, -BT, -Dye Allergies.  

## 2018-12-15 MED ORDER — BUPIVACAINE HCL 0.25 % IJ SOLN
4.0000 mL | INTRAMUSCULAR | Status: AC | PRN
  Administered 2018-12-14: 4 mL via INTRA_ARTICULAR

## 2018-12-15 MED ORDER — LIDOCAINE HCL 2 % IJ SOLN
4.0000 mL | INTRAMUSCULAR | Status: AC | PRN
  Administered 2018-12-14: 4 mL

## 2018-12-15 MED ORDER — TRIAMCINOLONE ACETONIDE 40 MG/ML IJ SUSP
80.0000 mg | INTRAMUSCULAR | Status: AC | PRN
  Administered 2018-12-14: 80 mg via INTRA_ARTICULAR

## 2019-01-06 ENCOUNTER — Telehealth: Payer: Self-pay

## 2019-01-06 NOTE — Telephone Encounter (Signed)
Patient called back refused to schedule say that she will skip this visit for this year.

## 2019-01-06 NOTE — Telephone Encounter (Signed)
Author phoned pt. to attempt to reschedule awv per provider need. No answer, author left VM asking for callback to reschedule. OK for PEC to reschedule so long as it is for 74min. Slot.

## 2019-01-07 DIAGNOSIS — H524 Presbyopia: Secondary | ICD-10-CM | POA: Diagnosis not present

## 2019-01-07 DIAGNOSIS — Z961 Presence of intraocular lens: Secondary | ICD-10-CM | POA: Diagnosis not present

## 2019-01-10 ENCOUNTER — Telehealth: Payer: Self-pay

## 2019-01-10 NOTE — Telephone Encounter (Signed)
Author phoned pt. to reschedule awv in light of COVID-19 pandemic. Pt. Stated she thought she already cancelled it. Pt. stated she was not able to do a virtual visit in lieu of coming into office, as she does not have a computer. Pt. Declined to reschedule for later in the year at this time.

## 2019-01-11 ENCOUNTER — Ambulatory Visit: Payer: Medicare Other

## 2019-01-15 ENCOUNTER — Other Ambulatory Visit: Payer: Self-pay | Admitting: Family Medicine

## 2019-01-15 ENCOUNTER — Other Ambulatory Visit (INDEPENDENT_AMBULATORY_CARE_PROVIDER_SITE_OTHER): Payer: Self-pay | Admitting: Physician Assistant

## 2019-01-15 DIAGNOSIS — M545 Low back pain, unspecified: Secondary | ICD-10-CM

## 2019-01-15 DIAGNOSIS — G8929 Other chronic pain: Secondary | ICD-10-CM

## 2019-01-16 NOTE — Telephone Encounter (Signed)
Rx request 

## 2019-01-18 NOTE — Telephone Encounter (Signed)
3 months supply with a refill sent 10/29/18; so she should have a medication available at her pharmacy. Is she taking med as prescribed?  Thanks, BJ

## 2019-01-18 NOTE — Telephone Encounter (Signed)
Spoke with patient and she doesn't need refill at this time. Patient stated that she may need refill later because 90 was sent in and the bottle she has now has no refills.Nothing further needed at this time.

## 2019-01-20 ENCOUNTER — Telehealth: Payer: Self-pay | Admitting: Family Medicine

## 2019-01-20 NOTE — Telephone Encounter (Signed)
Calling for zanaflex 4 MG tab refill. Last refill was 08/05/18 at [facility] for 90 tabs and 1 refill. Stated she takes 3 of these daily.  zanaflex 2 mg tabs sent in on 10/1018 was sent to the wrong pharmacy, sent to [facility] but she uses [facility] on [address].

## 2019-01-21 ENCOUNTER — Other Ambulatory Visit: Payer: Self-pay | Admitting: *Deleted

## 2019-01-21 DIAGNOSIS — G8929 Other chronic pain: Secondary | ICD-10-CM

## 2019-01-21 DIAGNOSIS — M545 Low back pain: Principal | ICD-10-CM

## 2019-01-21 MED ORDER — TIZANIDINE HCL 4 MG PO TABS: 4.0000 mg | ORAL_TABLET | Freq: Two times a day (BID) | ORAL | 1 refills | Status: DC | PRN

## 2019-01-21 NOTE — Telephone Encounter (Signed)
Rx sent to [facility] as requested

## 2019-02-07 ENCOUNTER — Encounter: Payer: Self-pay | Admitting: Family Medicine

## 2019-02-07 ENCOUNTER — Other Ambulatory Visit: Payer: Self-pay

## 2019-02-07 ENCOUNTER — Ambulatory Visit (INDEPENDENT_AMBULATORY_CARE_PROVIDER_SITE_OTHER): Payer: Medicare Other | Admitting: Family Medicine

## 2019-02-07 VITALS — Resp 16

## 2019-02-07 DIAGNOSIS — I10 Essential (primary) hypertension: Secondary | ICD-10-CM | POA: Diagnosis not present

## 2019-02-07 DIAGNOSIS — N393 Stress incontinence (female) (male): Secondary | ICD-10-CM | POA: Diagnosis not present

## 2019-02-07 DIAGNOSIS — G8929 Other chronic pain: Secondary | ICD-10-CM

## 2019-02-07 DIAGNOSIS — E559 Vitamin D deficiency, unspecified: Secondary | ICD-10-CM | POA: Diagnosis not present

## 2019-02-07 DIAGNOSIS — F5104 Psychophysiologic insomnia: Secondary | ICD-10-CM | POA: Diagnosis not present

## 2019-02-07 DIAGNOSIS — F411 Generalized anxiety disorder: Secondary | ICD-10-CM | POA: Diagnosis not present

## 2019-02-07 DIAGNOSIS — M545 Low back pain: Secondary | ICD-10-CM

## 2019-02-07 MED ORDER — ALPRAZOLAM 1 MG PO TABS: 0.5000 mg | ORAL_TABLET | Freq: Two times a day (BID) | ORAL | 2 refills | Status: DC | PRN

## 2019-02-07 MED ORDER — OXYBUTYNIN CHLORIDE ER 5 MG PO TB24: 5.0000 mg | ORAL_TABLET | Freq: Every day | ORAL | 0 refills | Status: DC

## 2019-02-07 NOTE — Assessment & Plan Note (Signed)
Problem is slightly worse due to not being able to go out or have visitors since COVID-19 pandemic. She would like to increase dose of alprazolam, so she will continue 1 mg tablet to take 0.5 mg twice daily as needed. We discussed some side effects of medications. Follow-up in 3 months.

## 2019-02-07 NOTE — Assessment & Plan Note (Signed)
No changes in ergocalciferol 50,000 units every 2 weeks. We will plan on checking 25 OH vitamin D next office visit.

## 2019-02-07 NOTE — Assessment & Plan Note (Signed)
Problem is stable. Continue gabapentin 300 mg at bedtime and Xanax 0.5 mg as needed. We discussed some side effects of medications. Good sleep hygiene.

## 2019-02-07 NOTE — Progress Notes (Signed)
Virtual Visit via Video Note   I connected with Ms Millikin on 02/07/19 at 11:30 AM EDT by a video enabled telemedicine application and verified that I am speaking with the correct person using two identifiers.  Location patient: home Location provider:home office Persons participating in the virtual visit: patient, provider  I discussed the limitations of evaluation and management by telemedicine and the availability of in person appointments.  She expressed understanding and agreed to proceed.   HPI: Ms Roadcap is a 84 yo female with Hx of anxiety,chornic pain,HTN,and migraine among some,who is being seen today for chronic disease management.  Anxiety and insomnia: She lives in a independent living facility. Anxiety has been worse for the past few weeks due to current public health concerns about COVID-19. She is not receiving visitors and has to stay in his room most of the day. She feels "lonely." She states encounter with her 4 sons.  She denies depressed mood or suicidal thoughts  Currently she is on alprazolam 0.5 mg twice daily as needed, max 40 tablets/month.  She is requesting alprazolam to be changed from 0.5 mg to 1 mg to continue 0.5 tablet daily as needed. She has tried several anxiolytic and antidepressant medication in the past, she did not tolerate them, aggravated migraines.   Hypertension:  Currently on verapamil 180 mg daily. She is taking medications as instructed, no side effects reported. BP has been checked daily and reporting BP numbers as "good."  She has not noted unusual headache, visual changes, exertional chest pain, dyspnea,  focal weakness, or edema.   Lab Results  Component Value Date   CREATININE 1.33 (H) 07/26/2018   BUN 31 (H) 07/26/2018   NA 137 07/26/2018   K 4.9 07/26/2018   CL 101 07/26/2018   CO2 29 07/26/2018   Today she is complaining about urine leakage, this is a chronic problem. Problem is exacerbated by movement, coughing,  laughing. She denies dysuria, gross hematuria, or decreased urine output. No changes in urine frequency. She has some urgency, exacerbated by hearing water run.  Chronic pain, generalized OA: Pain is sometimes radiates to left lower extremity. She has follow-up with orthopedist, reporting "steroid shot" in back and knee, did not help much. She is also on Celebrex, she did not think medication is helping with pain and she is afraid of causing kidney problems. She also takes Tylenol PM, she wonders if it is time for her to take this medication frequently. She is taking Zanaflex 4 mg 3 times per day, which seems to help with back pain.   She is also on gabapentin 300 mg at bedtime, added to treat lumbar radiculopathy as well as insomnia. She is going to bed around 11 PM, wakes up around 6 AM to go to the bathroom, she goes back to sleep for 1 to 2 hours. She is tolerating medication well, denies side effects.   Vitamin D deficiency: Currently she is on ergocalciferol 50,000 units every 2 weeks. Last 25 OH vitamin D was done on 11/26/17, low at 27.3.   ROS: See pertinent positives and negatives per HPI.  Past Medical History:  Diagnosis Date  . Anxiety   . Arthritis   . Chronic insomnia 03/30/2015  . Chronic low back pain 12/02/2016  . Depression   . GERD (gastroesophageal reflux disease)   . Hiatal hernia   . High cholesterol   . Hypertelorism   . Hypertension   . Meningioma ([place])   . Migraine  Past Surgical History:  Procedure Laterality Date  . STOMACH SURGERY      Family History  Problem Relation Age of Onset  . Migraines Father   . Stroke Father   . Heart Problems Mother   . Epilepsy Brother     Social History   Socioeconomic History  . Marital status: Widowed    Spouse name: Not on file  . Number of children: 4  . Years of education: 74  . Highest education level: Not on file  Occupational History  . Occupation: Retired  Scientific laboratory technician  . Financial  resource strain: Not on file  . Food insecurity:    Worry: Not on file    Inability: Not on file  . Transportation needs:    Medical: Not on file    Non-medical: Not on file  Tobacco Use  . Smoking status: Never Smoker  . Smokeless tobacco: Never Used  Substance and Sexual Activity  . Alcohol use: No  . Drug use: No  . Sexual activity: Not Currently  Lifestyle  . Physical activity:    Days per week: Not on file    Minutes per session: Not on file  . Stress: Not on file  Relationships  . Social connections:    Talks on phone: Not on file    Gets together: Not on file    Attends religious service: Not on file    Active member of club or organization: Not on file    Attends meetings of clubs or organizations: Not on file    Relationship status: Not on file  . Intimate partner violence:    Fear of current or ex partner: Not on file    Emotionally abused: Not on file    Physically abused: Not on file    Forced sexual activity: Not on file  Other Topics Concern  . Not on file  Social History Narrative   Lives at home alone   Patient drinks 1 glass of tea daily.   Patient is right handed.      Current Outpatient Medications:  .  ALPRAZolam (XANAX) 1 MG tablet, Take 0.5 tablets (0.5 mg total) by mouth 2 (two) times daily as needed for anxiety., Disp: 30 tablet, Rfl: 2 .  gabapentin (NEURONTIN) 300 MG capsule, TAKE ONE CAPSULE BY MOUTH AT BEDTIME, Disp: 30 capsule, Rfl: 4 .  lansoprazole (PREVACID) 15 MG capsule, 1 tab at 7:00 am (30 min before breakfast)., Disp: 90 capsule, Rfl: 1 .  Magnesium 250 MG TABS, Take 250 mg by mouth daily. 2 PO DAILY, Disp: , Rfl:  .  MAXALT-MLT 10 MG disintegrating tablet, Take 1 tablet (10 mg total) by mouth as needed for migraine. No more than 3 tabs per week., Disp: 12 tablet, Rfl: 2 .  MAXALT-MLT 10 MG disintegrating tablet, TAKE 1 TABLET BY MOUTH AS NEEDED., Disp: 12 tablet, Rfl: 0 .  oxybutynin (DITROPAN-XL) 5 MG 24 hr tablet, Take 1 tablet  (5 mg total) by mouth at bedtime., Disp: 90 tablet, Rfl: 0 .  Riboflavin 400 MG CAPS, Take 1 capsule (400 mg total) by mouth daily., Disp: , Rfl:  .  tiZANidine (ZANAFLEX) 4 MG tablet, Take 1 tablet (4 mg total) by mouth 3 times/day as needed-between meals & bedtime for muscle spasms., Disp: 90 tablet, Rfl: 1 .  tretinoin (RETIN-A) 0.1 % cream, APPLY TO AFFECTED AREA EVERY DAY AT BEDTIME, Disp: 45 g, Rfl: 2 .  Turmeric 500 MG CAPS, Take 1 capsule by mouth daily.,  Disp: , Rfl:  .  verapamil (VERELAN PM) 180 MG 24 hr capsule, Take 1 capsule (180 mg total) by mouth daily., Disp: 90 capsule, Rfl: 2 .  Vitamin D, Ergocalciferol, (DRISDOL) 50000 units CAPS capsule, 1 cap every 2 weeks., Disp: 12 capsule, Rfl: 3  EXAM:  VITALS per patient if applicable:Resp 16   GENERAL: alert, oriented, appears well and in no acute distress  HEENT: atraumatic, conjunttiva clear, no obvious facial abnormalities on inspection.  LUNGS: on inspection no signs of respiratory distress, breathing rate appears normal, no obvious gross SOB, gasping or wheezing  CV: no obvious cyanosis  MS: moves all visible extremities without noticeable abnormality  PSYCH/NEURO: pleasant and cooperative, no obvious depression. She is anxious, speech and thought processing grossly intact  ASSESSMENT AND PLAN:  Discussed the following assessment and plan:  Chronic insomnia Problem is stable. Continue gabapentin 300 mg at bedtime and Xanax 0.5 mg as needed. We discussed some side effects of medications. Good sleep hygiene.   Generalized anxiety disorder Problem is slightly worse due to not being able to go out or have visitors since COVID-19 pandemic. She would like to increase dose of alprazolam, so she will continue 1 mg tablet to take 0.5 mg twice daily as needed. We discussed some side effects of medications. Follow-up in 3 months.  Vitamin D deficiency No changes in ergocalciferol 50,000 units every 2 weeks. We will  plan on checking 25 OH vitamin D next office visit.  Chronic back pain Continue Tylenol arthritis 3 times daily as needed. Discontinue Celebrex, it is not helping. Continue gabapentin 300 mg at bedtime. Continue following with orthopedics. Fall precaution.  Essential (primary) hypertension Reporting BP adequately controlled. No changes in verapamil 180 mg daily. Continue monitoring BP regularly. Continue low-salt diet.  Urine, incontinence, stress female This is a chronic problem. Recommend Keagle exercises and pelvic floor exercises. She would like to try medication, explained that it may not help with stress incontinence, she would like to try oxybutynin XL 5 mg daily. We discussed side effects of medications, mainly anticholinergic ones.    I discussed the assessment and treatment plan with the patient.  She was provided an opportunity to ask questions and all were answered. The patient agreed with the plan and demonstrated an understanding of the instructions.    Return in about 3 months (around 05/09/2019) for anxiety,labs,urine incont.    Kahlani Graber Martinique, MD

## 2019-02-07 NOTE — Assessment & Plan Note (Addendum)
Continue Tylenol arthritis 3 times daily as needed. Discontinue Celebrex, it is not helping. Continue gabapentin 300 mg at bedtime. Continue following with orthopedics. Fall precaution.

## 2019-02-07 NOTE — Assessment & Plan Note (Signed)
Reporting BP adequately controlled. No changes in verapamil 180 mg daily. Continue monitoring BP regularly. Continue low-salt diet.

## 2019-02-07 NOTE — Assessment & Plan Note (Signed)
This is a chronic problem. Recommend Keagle exercises and pelvic floor exercises. She would like to try medication, explained that it may not help with stress incontinence, she would like to try oxybutynin XL 5 mg daily. We discussed side effects of medications, mainly anticholinergic ones.

## 2019-03-21 ENCOUNTER — Other Ambulatory Visit: Payer: Self-pay | Admitting: Family Medicine

## 2019-03-21 ENCOUNTER — Ambulatory Visit: Payer: Self-pay | Admitting: Family Medicine

## 2019-03-21 ENCOUNTER — Encounter: Payer: Self-pay | Admitting: Family Medicine

## 2019-03-21 ENCOUNTER — Other Ambulatory Visit: Payer: Self-pay

## 2019-03-21 ENCOUNTER — Ambulatory Visit (INDEPENDENT_AMBULATORY_CARE_PROVIDER_SITE_OTHER): Payer: Medicare Other | Admitting: Family Medicine

## 2019-03-21 DIAGNOSIS — F411 Generalized anxiety disorder: Secondary | ICD-10-CM | POA: Diagnosis not present

## 2019-03-21 DIAGNOSIS — E559 Vitamin D deficiency, unspecified: Secondary | ICD-10-CM

## 2019-03-21 DIAGNOSIS — I10 Essential (primary) hypertension: Secondary | ICD-10-CM | POA: Diagnosis not present

## 2019-03-21 DIAGNOSIS — R944 Abnormal results of kidney function studies: Secondary | ICD-10-CM | POA: Diagnosis not present

## 2019-03-21 MED ORDER — LOSARTAN POTASSIUM 25 MG PO TABS: 25.0000 mg | ORAL_TABLET | Freq: Every day | ORAL | 1 refills | Status: DC

## 2019-03-21 NOTE — Progress Notes (Addendum)
Virtual Visit via Video Note   I connected with Tracey Armstrong on 03/23/19 at 12:00 PM EDT by a video enabled telemedicine application and verified that I am speaking with the correct person using two identifiers.  Location patient: home Location provider:office Persons participating in the virtual visit: patient, provider  I discussed the limitations of evaluation and management by telemedicine and the availability of in person appointments. The patient expressed understanding and agreed to proceed.   HPI: Tracey Armstrong is a 84 yo female with Hx of anxiety,chronic pain,vit D def,and migraine who is concerned about elevated BP for the past 10 days. lives in a independent living facility. She had a fall ,fell backwards while standing up about 2 weeks ago, EMS was called and evaluated pt,states that BP was not taken at that time. She had some headache ,back to her baseline. Felt like electricity going through her legs and felt "little" dizzy a week later,EMG was called a 2nd time and BP was elevated,not sure about readings. She continue checking BP regularly and still running up: 205/100,207/100,and yesterday 184/98. Hypertension on Verapamil 180 mg daily. She thinks BO may be elevated because high salt in her meals. She is taking medications as instructed, no side effects reported.  She has not noted visual changes, exertional chest pain, dyspnea,  focal weakness, or edema. Last BNP was mildly abnormal, no history of CKD. She has not noted gross hematuria, foamy urine, or decrease urine output.   Lab Results  Component Value Date   CREATININE 1.33 (H) 07/26/2018   BUN 31 (H) 07/26/2018   NA 137 07/26/2018   K 4.9 07/26/2018   CL 101 07/26/2018   CO2 29 07/26/2018   She has Hx of lower back pain with radiculopathy,she is on Gabapentin. She also would like to schedule blood work.  Vit D deficiency: Currently she is on ergocalciferol 50,000 units every 2 weeks. Vitamin D supplementation  sometimes aggravates migraines.  Anxiety, last visit she was complaining about worsening anxiety, alprazolam was increased from 0.5 mg daily to 0.5 mg daily. Medication adjustment has helped greatly with anxiety. She has not noted any side effect. She has not tolerated anxiolytic and antidepressants in the past.  ROS: See pertinent positives and negatives per HPI.  Past Medical History:  Diagnosis Date  . Anxiety   . Arthritis   . Chronic insomnia 03/30/2015  . Chronic low back pain 12/02/2016  . Depression   . GERD (gastroesophageal reflux disease)   . Hiatal hernia   . High cholesterol   . Hypertelorism   . Hypertension   . Meningioma ([place])   . Migraine     Past Surgical History:  Procedure Laterality Date  . STOMACH SURGERY      Family History  Problem Relation Age of Onset  . Migraines Father   . Stroke Father   . Heart Problems Mother   . Epilepsy Brother     Social History   Socioeconomic History  . Marital status: Widowed    Spouse name: Not on file  . Number of children: 4  . Years of education: 32  . Highest education level: Not on file  Occupational History  . Occupation: Retired  Scientific laboratory technician  . Financial resource strain: Not on file  . Food insecurity:    Worry: Not on file    Inability: Not on file  . Transportation needs:    Medical: Not on file    Non-medical: Not on file  Tobacco Use  .  Smoking status: Never Smoker  . Smokeless tobacco: Never Used  Substance and Sexual Activity  . Alcohol use: No  . Drug use: No  . Sexual activity: Not Currently  Lifestyle  . Physical activity:    Days per week: Not on file    Minutes per session: Not on file  . Stress: Not on file  Relationships  . Social connections:    Talks on phone: Not on file    Gets together: Not on file    Attends religious service: Not on file    Active member of club or organization: Not on file    Attends meetings of clubs or organizations: Not on file     Relationship status: Not on file  . Intimate partner violence:    Fear of current or ex partner: Not on file    Emotionally abused: Not on file    Physically abused: Not on file    Forced sexual activity: Not on file  Other Topics Concern  . Not on file  Social History Narrative   Lives at home alone   Patient drinks 1 glass of tea daily.   Patient is right handed.      Current Outpatient Medications:  .  ALPRAZolam (XANAX) 1 MG tablet, Take 0.5 tablets (0.5 mg total) by mouth 2 (two) times daily as needed for anxiety., Disp: 30 tablet, Rfl: 2 .  gabapentin (NEURONTIN) 300 MG capsule, TAKE ONE CAPSULE BY MOUTH AT BEDTIME, Disp: 30 capsule, Rfl: 4 .  lansoprazole (PREVACID) 15 MG capsule, 1 tab at 7:00 am (30 min before breakfast)., Disp: 90 capsule, Rfl: 1 .  losartan (COZAAR) 25 MG tablet, TAKE 1 TABLET(25 MG) BY MOUTH DAILY, Disp: 90 tablet, Rfl: 2 .  Magnesium 250 MG TABS, Take 250 mg by mouth daily. 2 PO DAILY, Disp: , Rfl:  .  MAXALT-MLT 10 MG disintegrating tablet, Take 1 tablet (10 mg total) by mouth as needed for migraine. No more than 3 tabs per week., Disp: 12 tablet, Rfl: 2 .  MAXALT-MLT 10 MG disintegrating tablet, TAKE 1 TABLET BY MOUTH AS NEEDED., Disp: 12 tablet, Rfl: 0 .  oxybutynin (DITROPAN-XL) 5 MG 24 hr tablet, Take 1 tablet (5 mg total) by mouth at bedtime., Disp: 90 tablet, Rfl: 0 .  Riboflavin 400 MG CAPS, Take 1 capsule (400 mg total) by mouth daily., Disp: , Rfl:  .  tiZANidine (ZANAFLEX) 4 MG tablet, Take 1 tablet (4 mg total) by mouth 3 times/day as needed-between meals & bedtime for muscle spasms., Disp: 90 tablet, Rfl: 1 .  tretinoin (RETIN-A) 0.1 % cream, APPLY TO AFFECTED AREA EVERY DAY AT BEDTIME, Disp: 45 g, Rfl: 2 .  Turmeric 500 MG CAPS, Take 1 capsule by mouth daily., Disp: , Rfl:  .  verapamil (VERELAN PM) 180 MG 24 hr capsule, Take 1 capsule (180 mg total) by mouth daily., Disp: 90 capsule, Rfl: 2 .  Vitamin D, Ergocalciferol, (DRISDOL) 50000 units  CAPS capsule, 1 cap every 2 weeks., Disp: 12 capsule, Rfl: 3  EXAM:  VITALS per patient if applicable:N/A  GENERAL: alert, oriented, appears well and in no acute distress  HEENT: atraumatic, conjunctiva clear, no obvious facial abnormalities on inspection.  LUNGS: on inspection no signs of respiratory distress, breathing rate appears normal, no obvious gross SOB, gasping or wheezing  CV: no obvious cyanosis  Tracey: moves all visible extremities without noticeable abnormality  PSYCH/NEURO: pleasant and cooperative, no obvious depression,she is anxious.   ASSESSMENT AND PLAN:  Discussed the following assessment and plan: Orders Placed This Encounter  Procedures  . Microalbumin / creatinine urine ratio  . Basic metabolic panel  . VITAMIN D 25 Hydroxy (Vit-D Deficiency, Fractures)    Essential (primary) hypertension - Plan: Basic metabolic panel, CANCELED: Basic metabolic panel Not well controlled. Possible complications of elevated BP discussed. Low salt diet recommended. No changes in Verapamil 180 mg daily. Losartan 25 mg daily added. Continue monitoring BP regularly. Side effects of ARB meds discussed. BMP in 7-10 days.  Abnormal renal function test  Adequate hydration and avoidance of NSAID's.  Good BP controlled. Low salt diet. Further recommendations will be given according to lab results.  Generalized anxiety disorder Improved with increasing dose of Xanax, continue 0.5 mg bid. Some side effects discussed.  Vitamin D deficiency, unspecified - Plan: VITAMIN D 25 Hydroxy (Vit-D Deficiency, Fractures) Future labs will be arranged and further recommendations will be given accordingly. May changed Vit D2 to vit D3.   I discussed the assessment and treatment plan with the patient. She was provided an opportunity to ask questions and all were answered. The patient agreed with the plan and demonstrated an understanding of the instructions.   The patient was advised to  call back or seek an in-person evaluation if the symptoms worsen or if the condition fails to improve as anticipated.  Return in about 4 weeks (around 04/18/2019) for HTN.    [name], MD

## 2019-03-21 NOTE — Telephone Encounter (Signed)
Patient has appointment scheduled for 03/21/2019.

## 2019-03-21 NOTE — Telephone Encounter (Signed)
Pt. Reports over the weekend she had elevated BP readings. Lives in a retirement community and they checked it for her. Sat. 205/100,  Sun.  189/98. Had some dizziness at that time. Denies any dizziness this morning. Does have "a mild headache which is normal for me." Warm transfer to [place] in the practice.  Answer Assessment - Initial Assessment Questions 1. BLOOD PRESSURE: "What is the blood pressure?" "Did you take at least two measurements 5 minutes apart?"     Sat. 205/100  Sun. 184/98  2. ONSET: "When did you take your blood pressure?"     This weekend 3. HOW: "How did you obtain the blood pressure?" (e.g., visiting nurse, automatic home BP monitor)     Automatic machine 4. HISTORY: "Do you have a history of high blood pressure?"     Yes 5. MEDICATIONS: "Are you taking any medications for blood pressure?" "Have you missed any doses recently?"     Yes 6. OTHER SYMPTOMS: "Do you have any symptoms?" (e.g., headache, chest pain, blurred vision, difficulty breathing, weakness)     Mild headache this morning 7. PREGNANCY: "Is there any chance you are pregnant?" "When was your last menstrual period?"     No  Protocols used: HIGH BLOOD PRESSURE-A-AH

## 2019-03-22 ENCOUNTER — Telehealth: Payer: Self-pay | Admitting: Physical Medicine and Rehabilitation

## 2019-03-22 NOTE — Telephone Encounter (Signed)
He could do bursa injection if he felt like doing it, we did not use fluoro

## 2019-03-23 ENCOUNTER — Encounter: Payer: Self-pay | Admitting: Orthopaedic Surgery

## 2019-03-23 ENCOUNTER — Ambulatory Visit (INDEPENDENT_AMBULATORY_CARE_PROVIDER_SITE_OTHER): Payer: Medicare Other | Admitting: Orthopaedic Surgery

## 2019-03-23 ENCOUNTER — Other Ambulatory Visit: Payer: Self-pay

## 2019-03-23 DIAGNOSIS — M1712 Unilateral primary osteoarthritis, left knee: Secondary | ICD-10-CM | POA: Diagnosis not present

## 2019-03-23 DIAGNOSIS — G8929 Other chronic pain: Secondary | ICD-10-CM

## 2019-03-23 DIAGNOSIS — M25562 Pain in left knee: Secondary | ICD-10-CM | POA: Diagnosis not present

## 2019-03-23 MED ORDER — METHYLPREDNISOLONE ACETATE 40 MG/ML IJ SUSP
40.0000 mg | INTRAMUSCULAR | Status: AC | PRN
  Administered 2019-03-23: 40 mg via INTRA_ARTICULAR

## 2019-03-23 MED ORDER — LIDOCAINE HCL 1 % IJ SOLN
3.0000 mL | INTRAMUSCULAR | Status: AC | PRN
  Administered 2019-03-23: 3 mL

## 2019-03-23 NOTE — Progress Notes (Signed)
Office Visit Note   Patient: Tracey Armstrong           Date of Birth: 11-27-1934           MRN: [id number] Visit Date: 03/23/2019              Requested by: Martinique, [name], MD [address] [place], [place] [address] PCP: Martinique, [name], MD   Assessment & Plan: Visit Diagnoses:  1. Primary osteoarthritis of left knee   2. Chronic pain of left knee     Plan: Given the pain from her osteoarthritis in her left knee and the fact that steroid injections have helped in the past and given the fact that she is been without injection for 4 months I felt comfortable providing one in her knee today.  I did encourage her to use a cane as well in her opposite hand for walker if she needs to to offload her joint especially if she is having concerns about falls.  She is still not interested in any type of joint replacement surgery.  She knows to wait at least 3 to 4 months between injections.  All question concerns were answered and addressed.  Follow-up will be as needed.  Follow-Up Instructions: Return if symptoms worsen or fail to improve.   Orders:  No orders of the defined types were placed in this encounter.  No orders of the defined types were placed in this encounter.     Procedures: Large Joint Inj: L knee on 03/23/2019 4:05 PM Indications: diagnostic evaluation and pain Details: 22 G 1.5 in needle, superolateral approach  Arthrogram: No  Medications: 3 mL lidocaine 1 %; 40 mg methylPREDNISolone acetate 40 MG/ML Outcome: tolerated well, no immediate complications Procedure, treatment alternatives, risks and benefits explained, specific risks discussed. Consent was given by the patient. Immediately prior to procedure a time out was called to verify the correct patient, procedure, equipment, support staff and site/side marked as required. Patient was prepped and draped in the usual sterile fashion.       Clinical Data: No additional findings.   Subjective: Chief Complaint   Patient presents with  . Left Knee - Pain  The patient is very active 84 year old female who lives in independent living at a facility.  She has acute on chronic left knee pain and known osteoarthritis of left knee.  She would like to have a steroid injection today.  She has been recently walking with a walker because she almost had a fall.  She drove herself today and she is not using a cane or walker today she does have a cane but does not use it.  She has had successful steroid injections in her left knee in the past.  Is been just over 4 months since her last injection.  HPI  Review of Systems She currently denies any headache, chest pain, shortness of breath, fever, chills, nausea, vomiting  Objective: Vital Signs: There were no vitals taken for this visit.  Physical Exam She is alert and orient x3 and in no acute distress Ortho Exam Examination of her left knee shows some slight valgus malalignment.  There is medial and lateral joint line tenderness but good range of motion overall.  There is no significant effusion. Specialty Comments:  No specialty comments available.  Imaging: No results found.   PMFS History: Patient Active Problem List   Diagnosis Date Noted  . Urine, incontinence, stress female 02/07/2019  . Knee osteoarthritis 02/01/2018  . Iron  deficiency anemia 10/28/2017  . Rhinitis, allergic 07/27/2017  . GERD (gastroesophageal reflux disease) 06/16/2017  . Chronic pain disorder 06/16/2017  . Vitamin D deficiency 05/25/2017  . Hyperlipidemia 05/25/2017  . 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 Pain 02/26/2017  . Chronic low back pain 12/02/2016  . Abdominal pain 03/26/2016  . Essential (primary) hypertension 03/26/2016  . 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 Peptic esophagitis 03/26/2016  . Neuralgia neuritis, sciatic nerve 03/26/2016  . 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[place]) 06/20/2015  . Chronic insomnia 03/30/2015  . Chronic migraine 01/10/2015  . Depression 04/20/2014   . Generalized anxiety disorder 04/20/2014  . Chronic back pain 04/20/2014  . Chronic daily headache 04/20/2014  . Essential hypertension 04/20/2014  . Migraine headache 04/19/2014   Past Medical History:  Diagnosis Date  . Anxiety   . Arthritis   . Chronic insomnia 03/30/2015  . Chronic low back pain 12/02/2016  . Depression   . GERD (gastroesophageal reflux disease)   . Hiatal hernia   . High cholesterol   . Hypertelorism   . Hypertension   . Meningioma ([place])   . Migraine     Family History  Problem Relation Age of Onset  . Migraines Father   . Stroke Father   . Heart Problems Mother   . Epilepsy Brother     Past Surgical History:  Procedure Laterality Date  . STOMACH SURGERY     Social History   Occupational History  . Occupation: Retired  Tobacco Use  . Smoking status: Never Smoker  . Smokeless tobacco: Never Used  Substance and Sexual Activity  . Alcohol use: No  . Drug use: No  . Sexual activity: Not Currently

## 2019-03-23 NOTE — Telephone Encounter (Signed)
Please see message below. Patient is scheduled with Dr. Ninfa Linden this afternoon. Possible bursa injection also?

## 2019-03-23 NOTE — Telephone Encounter (Signed)
See below

## 2019-03-24 ENCOUNTER — Other Ambulatory Visit (INDEPENDENT_AMBULATORY_CARE_PROVIDER_SITE_OTHER): Payer: Medicare Other

## 2019-03-24 ENCOUNTER — Telehealth: Payer: Self-pay | Admitting: Family Medicine

## 2019-03-24 DIAGNOSIS — I10 Essential (primary) hypertension: Secondary | ICD-10-CM | POA: Diagnosis not present

## 2019-03-24 DIAGNOSIS — E559 Vitamin D deficiency, unspecified: Secondary | ICD-10-CM

## 2019-03-24 DIAGNOSIS — R944 Abnormal results of kidney function studies: Secondary | ICD-10-CM | POA: Diagnosis not present

## 2019-03-24 LAB — BASIC METABOLIC PANEL
BUN: 15 mg/dL (ref 6–23)
CO2: 26 mEq/L (ref 19–32)
Calcium: 9.8 mg/dL (ref 8.4–10.5)
Chloride: 100 mEq/L (ref 96–112)
Creatinine, Ser: 0.88 mg/dL (ref 0.40–1.20)
GFR: 61.48 mL/min (ref >60.00)
Glucose, Bld: 104 mg/dL — ABNORMAL HIGH (ref 70–99)
Potassium: 4.1 mEq/L (ref 3.5–5.1)
Sodium: 137 mEq/L (ref 135–145)

## 2019-03-24 LAB — MICROALBUMIN / CREATININE URINE RATIO
Creatinine,U: 26.5 mg/dL
Microalb Creat Ratio: 2.6 mg/g (ref 0.0–30.0)
Microalb, Ur: <0.7 mg/dL (ref 0.0–1.9)

## 2019-03-24 LAB — VITAMIN D 25 HYDROXY (VIT D DEFICIENCY, FRACTURES): VITD: 34.08 ng/mL (ref 30.00–100.00)

## 2019-03-24 NOTE — Telephone Encounter (Signed)
The patient came in for labs today and left a few pages letter for Dr. Martinique  Disposition: Dr's folder

## 2019-03-25 NOTE — Telephone Encounter (Signed)
Letter placed on provider's desk for review.

## 2019-04-01 ENCOUNTER — Ambulatory Visit: Payer: Self-pay
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
## 2019-04-04 ENCOUNTER — Ambulatory Visit: Payer: Medicare Other | Admitting: Family Medicine

## 2019-04-04 ENCOUNTER — Encounter: Payer: Self-pay | Admitting: Family Medicine

## 2019-04-04 ENCOUNTER — Other Ambulatory Visit: Payer: Self-pay

## 2019-04-04 ENCOUNTER — Telehealth: Payer: Self-pay | Admitting: Family Medicine

## 2019-04-04 DIAGNOSIS — F411 Generalized anxiety disorder: Secondary | ICD-10-CM

## 2019-04-04 NOTE — Telephone Encounter (Signed)
Patient is requesting a copy of her last bloodwork to be mailed to her to address on file- address verified.

## 2019-04-04 NOTE — Progress Notes (Signed)
Spoke with Tracey Armstrong, she would like to come to the office and be evaluated in person. Complaining about "electricity going through my whole body", it has happened 3 times. No other associated symptoms.  Plan: She will come tomorrow at 3 PM. Instructed about warning signs.  [name], MD

## 2019-04-05 ENCOUNTER — Encounter: Payer: Self-pay | Admitting: Family Medicine

## 2019-04-05 ENCOUNTER — Ambulatory Visit (INDEPENDENT_AMBULATORY_CARE_PROVIDER_SITE_OTHER): Payer: Medicare Other | Admitting: Family Medicine

## 2019-04-05 ENCOUNTER — Other Ambulatory Visit: Payer: Self-pay

## 2019-04-05 ENCOUNTER — Telehealth: Payer: Self-pay | Admitting: Family Medicine

## 2019-04-05 VITALS — BP 124/62 | HR 79 | Temp 98.1°F | Resp 12 | Wt 142.4 lb

## 2019-04-05 DIAGNOSIS — I10 Essential (primary) hypertension: Secondary | ICD-10-CM

## 2019-04-05 DIAGNOSIS — R208 Other disturbances of skin sensation: Secondary | ICD-10-CM | POA: Diagnosis not present

## 2019-04-05 DIAGNOSIS — F411 Generalized anxiety disorder: Secondary | ICD-10-CM | POA: Diagnosis not present

## 2019-04-05 NOTE — Patient Instructions (Signed)
A few things to remember from today's visit:   Electrical shock sensation - Plan: Basic metabolic panel, CBC, CK, Ambulatory referral to Neurology  Essential hypertension  Generalized anxiety disorder  Appointment with neurologist will be arranged. Further recommendation will be given according to lab results.  Please be sure medication list is accurate. If a new problem present, please set up appointment sooner than planned today.

## 2019-04-05 NOTE — Telephone Encounter (Signed)
Copied from [place] [id number]. Topic: General - Other >> Apr 04, 2019  9:21 AM Tracey Armstrong wrote: Reason for CRM: Patient called to request copy of her labs mailed out to her home ASAP. Please advise   Call pt and left message .
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
## 2019-04-05 NOTE — Assessment & Plan Note (Addendum)
BP adequately controlled today. Continue verapamil 180 mg daily. Continue monitoring BP regularly. We discussed possible complications of elevated BP. Continue low-salt diet.

## 2019-04-05 NOTE — Assessment & Plan Note (Signed)
This probably could be affecting BP. He has not tolerated SSRIs or SNRIs in the past. Continue alprazolam 0.5 mg twice daily, we had discussed side effects. Keep next follow-up appointment.

## 2019-04-06 ENCOUNTER — Other Ambulatory Visit: Payer: Self-pay | Admitting: Physician Assistant

## 2019-04-06 LAB — CBC
HCT: 36.5 % (ref 36.0–46.0)
Hemoglobin: 12.2 g/dL (ref 12.0–15.0)
MCHC: 33.5 g/dL (ref 30.0–36.0)
MCV: 95.7 fl (ref 78.0–100.0)
Platelets: 236 10*3/uL (ref 150.0–400.0)
RBC: 3.82 Mil/uL — ABNORMAL LOW (ref 3.87–5.11)
RDW: 13 % (ref 11.5–15.5)
WBC: 6.4 10*3/uL (ref 4.0–10.5)

## 2019-04-06 LAB — BASIC METABOLIC PANEL
BUN: 20 mg/dL (ref 6–23)
CO2: 26 mEq/L (ref 19–32)
Calcium: 9.5 mg/dL (ref 8.4–10.5)
Chloride: 100 mEq/L (ref 96–112)
Creatinine, Ser: 1 mg/dL (ref 0.40–1.20)
GFR: 53.04 mL/min — ABNORMAL LOW (ref >60.00)
Glucose, Bld: 77 mg/dL (ref 70–99)
Potassium: 4.8 mEq/L (ref 3.5–5.1)
Sodium: 134 mEq/L — ABNORMAL LOW (ref 135–145)

## 2019-04-06 LAB — CK: Total CK: 85 U/L (ref 7–177)

## 2019-04-06 LAB — VITAMIN B12: Vitamin B-12: 681 pg/mL (ref 211–911)

## 2019-04-06 NOTE — Telephone Encounter (Signed)
Spoke with patient she asked if she can get a Rx for Prednisone called into her pharmacy. Patient uses the [facility] on the corner of [address]. The number to contact patient is [phone]    The number to the pharmacy is [phone]

## 2019-04-06 NOTE — Telephone Encounter (Signed)
See below Had a left knee injection 03/23/19

## 2019-04-07 ENCOUNTER — Other Ambulatory Visit: Payer: Self-pay | Admitting: Family Medicine

## 2019-04-07 DIAGNOSIS — G8929 Other chronic pain: Secondary | ICD-10-CM

## 2019-04-07 NOTE — Telephone Encounter (Signed)
Would not recommend since she just got a knee injection

## 2019-04-08 NOTE — Telephone Encounter (Signed)
Patient aware of the below message but states she would really like something, states every bone in her body hurts

## 2019-04-09 MED ORDER — METHYLPREDNISOLONE 4 MG PO TABS: ORAL_TABLET | ORAL | 0 refills | Status: DC

## 2019-04-11 ENCOUNTER — Telehealth: Payer: Self-pay | Admitting: Orthopaedic Surgery

## 2019-04-11 MED ORDER — METHYLPREDNISOLONE 4 MG PO TABS: ORAL_TABLET | ORAL | 0 refills | Status: DC

## 2019-04-11 NOTE — Telephone Encounter (Signed)
Patient aware Rx is at her pharmacy

## 2019-04-11 NOTE — Telephone Encounter (Signed)
Looks like Dr. Ninfa Linden gave it to her today

## 2019-04-11 NOTE — Telephone Encounter (Signed)
Copied from [place] [id number]. Topic: Quick Communication - Lab Results (Clinic Use ONLY) >> Apr 08, 2019  9:12 AM Rebecca Eaton, CMA wrote: Called patient to inform them of 04/05/2019 lab results. When patient returns call, triage nurse may disclose results. >> Apr 08, 2019 10:19 AM Mcneil, Ja-Kwan wrote: Pt called back for lab results. Attempted to transfer call to nurse triage. Pt stated she would prefer that the results be mailed to her home.

## 2019-04-11 NOTE — Telephone Encounter (Signed)
I sent in a steroid taper to try.

## 2019-04-11 NOTE — Telephone Encounter (Signed)
Please advise 

## 2019-04-11 NOTE — Telephone Encounter (Signed)
Pt called in said she is still in a lot of pain in her left left and she's wondering if she can be prescribed prednisone to help the pain? [phone]

## 2019-04-12 DIAGNOSIS — R262 Difficulty in walking, not elsewhere classified: Secondary | ICD-10-CM | POA: Diagnosis not present

## 2019-04-12 DIAGNOSIS — M545 Low back pain: Secondary | ICD-10-CM | POA: Diagnosis not present

## 2019-04-12 DIAGNOSIS — R2681 Unsteadiness on feet: Secondary | ICD-10-CM | POA: Diagnosis not present

## 2019-04-12 DIAGNOSIS — M6281 Muscle weakness (generalized): Secondary | ICD-10-CM | POA: Diagnosis not present

## 2019-04-12 NOTE — Telephone Encounter (Signed)
Patient has appointment scheduled for 04/18/2019 with Dr. Martinique.  Nothing further needed at this time.

## 2019-04-13 DIAGNOSIS — R262 Difficulty in walking, not elsewhere classified: Secondary | ICD-10-CM | POA: Diagnosis not present

## 2019-04-13 DIAGNOSIS — R2681 Unsteadiness on feet: Secondary | ICD-10-CM | POA: Diagnosis not present

## 2019-04-13 DIAGNOSIS — M545 Low back pain: Secondary | ICD-10-CM | POA: Diagnosis not present

## 2019-04-13 DIAGNOSIS — M6281 Muscle weakness (generalized): Secondary | ICD-10-CM | POA: Diagnosis not present

## 2019-04-14 ENCOUNTER — Other Ambulatory Visit: Payer: Self-pay | Admitting: Family Medicine

## 2019-04-14 DIAGNOSIS — G8929 Other chronic pain: Secondary | ICD-10-CM

## 2019-04-15 DIAGNOSIS — R2681 Unsteadiness on feet: Secondary | ICD-10-CM | POA: Diagnosis not present

## 2019-04-15 DIAGNOSIS — M545 Low back pain: Secondary | ICD-10-CM | POA: Diagnosis not present

## 2019-04-15 DIAGNOSIS — R262 Difficulty in walking, not elsewhere classified: Secondary | ICD-10-CM | POA: Diagnosis not present

## 2019-04-15 DIAGNOSIS — M6281 Muscle weakness (generalized): Secondary | ICD-10-CM | POA: Diagnosis not present

## 2019-04-17 ENCOUNTER — Other Ambulatory Visit: Payer: Self-pay | Admitting: Family Medicine

## 2019-04-17 NOTE — Progress Notes (Signed)
HPI:   Ms.Vallerie Armstrong is a 84 y.o. female, who is here today to follow on her last OV. She was last seen on 04/11/19,when she was c/o electric shock sensation and fall with no confusion or urine/bowel incontinence.  She has an appt with neuro. Hx of migraine, denies unusual headaches. Denies visual changes,CP,SOB,palpitations,abdominal pain,N/V, or urinary symptoms.  Blood work otherwise negative.  She has not episodes since her last visit. Anxiety , she is on Xanax 0.5 mg bid as needed. Tolerating medication well. Denies side effects. No depressed mood.   Lab Results  Component Value Date   CKTOTAL 85 04/05/2019    Lab Results  Component Value Date   WBC 6.4 04/05/2019   HGB 12.2 04/05/2019   HCT 36.5 04/05/2019   MCV 95.7 04/05/2019   PLT 236.0 04/05/2019   Lab Results  Component Value Date   CREATININE 1.00 04/05/2019   BUN 20 04/05/2019   NA 134 (L) 04/05/2019   K 4.8 04/05/2019   CL 100 04/05/2019   CO2 26 04/05/2019   Lab Results  Component Value Date   OEUMPNTI14 431 04/05/2019   Since her last OV,she has followed with ortho for knee pain. She received intra articular injection,did not help. Started PT and it is helping some. Pain sometimes 10/10. Exacerbated by prolonged standing/walking and alleviated with rest.   Back pain,she uses the Icy hot patch and Tylenol. Radiated to LE's. She stopped Gabapentin because afraid of side effect and not sure if it was helping. Taking Zanaflex 2-4 mg tid, it is helping with pain. Denies side effects.  Review of Systems  Constitutional: Negative for activity change, appetite change, chills and fever.  Gastrointestinal: Negative for abdominal pain, nausea and vomiting.  Endocrine: Negative for cold intolerance and heat intolerance.  Genitourinary: Negative for dysuria and hematuria.  Musculoskeletal: Positive for arthralgias. Negative for gait problem and joint swelling.  Neurological: Negative for  facial asymmetry and speech difficulty.  Psychiatric/Behavioral: Positive for sleep disturbance. Negative for confusion. The patient is nervous/anxious.    Rest see pertinent positives and negatives per HPI.   Current Outpatient Medications on File Prior to Visit  Medication Sig Dispense Refill   ALPRAZolam (XANAX) 1 MG tablet Take 0.5 tablets (0.5 mg total) by mouth 2 (two) times daily as needed for anxiety. 30 tablet 2   gabapentin (NEURONTIN) 300 MG capsule TAKE 1 CAPSULE BY MOUTH AT BEDTIME 30 capsule 4   lansoprazole (PREVACID) 15 MG capsule 1 tab at 7:00 am (30 min before breakfast). 90 capsule 1   Magnesium 250 MG TABS Take 250 mg by mouth daily. 2 PO DAILY     MAXALT-MLT 10 MG disintegrating tablet Take 1 tablet (10 mg total) by mouth as needed for migraine. No more than 3 tabs per week. 12 tablet 2   methylPREDNISolone (MEDROL) 4 MG tablet Medrol dose pack. Take as instructed 21 tablet 0   oxybutynin (DITROPAN-XL) 5 MG 24 hr tablet Take 1 tablet (5 mg total) by mouth at bedtime. 90 tablet 0   Riboflavin 400 MG CAPS Take 1 capsule (400 mg total) by mouth daily.     tiZANidine (ZANAFLEX) 4 MG tablet TAKE 1 TABLET BY MOUTH THREE TIMES DAILY AS NEEDED.BETWEEN MEALS AND BEDTIME FOR MUSCLE SPASMS 90 tablet 2   tretinoin (RETIN-A) 0.1 % cream APPLY TO AFFECTED AREA EVERY DAY AT BEDTIME 45 g 2   Turmeric 500 MG CAPS Take 1 capsule by mouth daily.  verapamil (VERELAN PM) 180 MG 24 hr capsule Take 1 capsule (180 mg total) by mouth daily. 90 capsule 2   Vitamin D, Ergocalciferol, (DRISDOL) 50000 units CAPS capsule 1 cap every 2 weeks. 12 capsule 3   No current facility-administered medications on file prior to visit.      Past Medical History:  Diagnosis Date   Anxiety    Arthritis    Chronic insomnia 03/30/2015   Chronic low back pain 12/02/2016   Depression    GERD (gastroesophageal reflux disease)    Hiatal hernia    High cholesterol    Hypertelorism     Hypertension    Meningioma ([facility])    Migraine    Allergies  Allergen Reactions   Dihydrotachysterol     Other reaction(s): Other (See Comments)   Trazodone And Nefazodone Other (See Comments)    Worsens headaches   Valium [Diazepam] Other (See Comments)    headaches   Citalopram     Other reaction(s): Headache   Other     "States all antidepressants cause migraines."    Social History   Socioeconomic History   Marital status: Widowed    Spouse name: Not on file   Number of children: 4   Years of education: 12   Highest education level: Not on file  Occupational History   Occupation: Retired  Scientist, product/process development strain: Not on file   Food insecurity    Worry: Not on file    Inability: Not on Lexicographer needs    Medical: Not on file    Non-medical: Not on file  Tobacco Use   Smoking status: Never Smoker   Smokeless tobacco: Never Used  Substance and Sexual Activity   Alcohol use: No   Drug use: No   Sexual activity: Not Currently  Lifestyle   Physical activity    Days per week: Not on file    Minutes per session: Not on file   Stress: Not on file  Relationships   Social connections    Talks on phone: Not on file    Gets together: Not on file    Attends religious service: Not on file    Active member of club or organization: Not on file    Attends meetings of clubs or organizations: Not on file    Relationship status: Not on file  Other Topics Concern   Not on file  Social History Narrative   Lives at home alone   Patient drinks 1 glass of tea daily.   Patient is right handed.    Vitals:   04/18/19 1121  BP: 120/72  Pulse: 73  Resp: 12  Temp: (!) 97.5 F (36.4 C)  SpO2: 97%   Body mass index is 27.78 kg/m.   Physical Exam  Nursing note and vitals reviewed. Constitutional: She is oriented to person, place, and time. She appears well-developed and well-nourished. No distress.  HENT:  Head:  Normocephalic and atraumatic.  Mouth/Throat: Oropharynx is clear and moist and mucous membranes are normal.  Eyes: Pupils are equal, round, and reactive to light. Conjunctivae are normal.  Cardiovascular: Normal rate and regular rhythm.  No murmur heard. Pulses:      Dorsalis pedis pulses are 2+ on the right side and 2+ on the left side.  Respiratory: Effort normal and breath sounds normal. No respiratory distress.  GI: Soft. She exhibits no mass. There is no hepatomegaly. There is no abdominal tenderness.  Musculoskeletal:  General: No edema.     Thoracic back: She exhibits deformity.     Lumbar back: She exhibits tenderness.  Lymphadenopathy:    She has no cervical adenopathy.  Neurological: She is alert and oriented to person, place, and time. She has normal strength. No cranial nerve deficit.  Otherwise stable gait,not assisted.  Skin: Skin is warm. No rash noted. No erythema.  Psychiatric: She has a normal mood and affect.  Well groomed, good eye contact.    ASSESSMENT AND PLAN:  Ms. Rin was seen today for follow-up.  Diagnoses and all orders for this visit:  Chronic low back pain, unspecified back pain laterality, unspecified whether sciatica present Stable. Continue current management. Side effects of Zanaflex discussed. Fall precautions.  Generalized anxiety disorder Xanax still helping. No changes in dose. She has tried [employer] and [employer] well tolerated. F/U in 4 months.  Electrical shock sensation No episodes since her last OV. ? Epilepsy or  Migraine without headache among some to consider. Instructed about warning signs. Keep appt with neurologist.    -Ms. Deaisa Tavella was advised to return sooner than planned today if new concerns arise.       Kirti Carl G. Martinique, MD  [facility]. [place] office.

## 2019-04-18 ENCOUNTER — Other Ambulatory Visit: Payer: Self-pay

## 2019-04-18 ENCOUNTER — Ambulatory Visit (INDEPENDENT_AMBULATORY_CARE_PROVIDER_SITE_OTHER): Payer: Medicare Other | Admitting: Family Medicine

## 2019-04-18 ENCOUNTER — Encounter: Payer: Self-pay | Admitting: Family Medicine

## 2019-04-18 VITALS — BP 120/72 | HR 73 | Temp 97.5°F | Resp 12 | Ht 60.0 in | Wt 142.2 lb

## 2019-04-18 DIAGNOSIS — G8929 Other chronic pain: Secondary | ICD-10-CM | POA: Diagnosis not present

## 2019-04-18 DIAGNOSIS — F411 Generalized anxiety disorder: Secondary | ICD-10-CM

## 2019-04-18 DIAGNOSIS — M545 Low back pain, unspecified: Secondary | ICD-10-CM

## 2019-04-18 DIAGNOSIS — R208 Other disturbances of skin sensation: Secondary | ICD-10-CM

## 2019-04-18 NOTE — Patient Instructions (Signed)
A few things to remember from today's visit:   Chronic low back pain, unspecified back pain laterality, unspecified whether sciatica present  Generalized anxiety disorder  Keep appointment with neurologist to follow on electricity sensation.  Please be sure medication list is accurate. If a new problem present, please set up appointment sooner than planned today.

## 2019-04-19 DIAGNOSIS — R262 Difficulty in walking, not elsewhere classified: Secondary | ICD-10-CM | POA: Diagnosis not present

## 2019-04-19 DIAGNOSIS — M545 Low back pain: Secondary | ICD-10-CM | POA: Diagnosis not present

## 2019-04-19 DIAGNOSIS — R2681 Unsteadiness on feet: Secondary | ICD-10-CM | POA: Diagnosis not present

## 2019-04-19 DIAGNOSIS — M6281 Muscle weakness (generalized): Secondary | ICD-10-CM | POA: Diagnosis not present

## 2019-04-20 ENCOUNTER — Encounter: Payer: Self-pay | Admitting: Family Medicine

## 2019-04-20 DIAGNOSIS — R262 Difficulty in walking, not elsewhere classified: Secondary | ICD-10-CM | POA: Diagnosis not present

## 2019-04-20 DIAGNOSIS — R2681 Unsteadiness on feet: Secondary | ICD-10-CM | POA: Diagnosis not present

## 2019-04-20 DIAGNOSIS — M6281 Muscle weakness (generalized): Secondary | ICD-10-CM | POA: Diagnosis not present

## 2019-04-20 DIAGNOSIS — M545 Low back pain: Secondary | ICD-10-CM | POA: Diagnosis not present

## 2019-04-22 ENCOUNTER — Telehealth: Payer: Self-pay | Admitting: Family Medicine

## 2019-04-22 DIAGNOSIS — R2681 Unsteadiness on feet: Secondary | ICD-10-CM | POA: Diagnosis not present

## 2019-04-22 DIAGNOSIS — R262 Difficulty in walking, not elsewhere classified: Secondary | ICD-10-CM | POA: Diagnosis not present

## 2019-04-22 DIAGNOSIS — M6281 Muscle weakness (generalized): Secondary | ICD-10-CM | POA: Diagnosis not present

## 2019-04-22 DIAGNOSIS — M545 Low back pain: Secondary | ICD-10-CM | POA: Diagnosis not present

## 2019-04-22 NOTE — Telephone Encounter (Signed)
Medication Refill - Medication: gabapentin (NEURONTIN) 300 MG capsule  - pt recently requested to be off of this medication. Pt is asking to resume refills. She has 15 days left, she plans to call pharmacy when refill is needed. She is taking one after lunch and one before bed.  Has the patient contacted their pharmacy? Yes.   (Agent: If no, request that the patient contact the pharmacy for the refill.) (Agent: If yes, when and what did the pharmacy advise?)  Preferred Pharmacy (with phone number or street name):  [facility] [place], [place] - [address]  [place] [address]  Phone: [phone] Fax: [phone]     Agent: Please be advised that RX refills may take up to 3 business days. We ask that you follow-up with your pharmacy.

## 2019-04-25 DIAGNOSIS — M6281 Muscle weakness (generalized): Secondary | ICD-10-CM | POA: Diagnosis not present

## 2019-04-25 DIAGNOSIS — R262 Difficulty in walking, not elsewhere classified: Secondary | ICD-10-CM | POA: Diagnosis not present

## 2019-04-25 DIAGNOSIS — M545 Low back pain: Secondary | ICD-10-CM | POA: Diagnosis not present

## 2019-04-25 DIAGNOSIS — R2681 Unsteadiness on feet: Secondary | ICD-10-CM | POA: Diagnosis not present

## 2019-04-25 NOTE — Telephone Encounter (Signed)
Message sent to Dr. Jordan for review and approval. 

## 2019-04-27 DIAGNOSIS — M6281 Muscle weakness (generalized): Secondary | ICD-10-CM | POA: Diagnosis not present

## 2019-04-27 DIAGNOSIS — R2681 Unsteadiness on feet: Secondary | ICD-10-CM | POA: Diagnosis not present

## 2019-04-27 DIAGNOSIS — M545 Low back pain: Secondary | ICD-10-CM | POA: Diagnosis not present

## 2019-04-27 DIAGNOSIS — R262 Difficulty in walking, not elsewhere classified: Secondary | ICD-10-CM | POA: Diagnosis not present

## 2019-04-29 DIAGNOSIS — R2681 Unsteadiness on feet: Secondary | ICD-10-CM | POA: Diagnosis not present

## 2019-04-29 DIAGNOSIS — M6281 Muscle weakness (generalized): Secondary | ICD-10-CM | POA: Diagnosis not present

## 2019-04-29 DIAGNOSIS — M545 Low back pain: Secondary | ICD-10-CM | POA: Diagnosis not present

## 2019-04-29 DIAGNOSIS — R262 Difficulty in walking, not elsewhere classified: Secondary | ICD-10-CM | POA: Diagnosis not present

## 2019-05-02 DIAGNOSIS — R2681 Unsteadiness on feet: Secondary | ICD-10-CM | POA: Diagnosis not present

## 2019-05-02 DIAGNOSIS — M6281 Muscle weakness (generalized): Secondary | ICD-10-CM | POA: Diagnosis not present

## 2019-05-02 DIAGNOSIS — M545 Low back pain: Secondary | ICD-10-CM | POA: Diagnosis not present

## 2019-05-02 DIAGNOSIS — R262 Difficulty in walking, not elsewhere classified: Secondary | ICD-10-CM | POA: Diagnosis not present

## 2019-05-04 DIAGNOSIS — M6281 Muscle weakness (generalized): Secondary | ICD-10-CM | POA: Diagnosis not present

## 2019-05-04 DIAGNOSIS — R262 Difficulty in walking, not elsewhere classified: Secondary | ICD-10-CM | POA: Diagnosis not present

## 2019-05-04 DIAGNOSIS — R2681 Unsteadiness on feet: Secondary | ICD-10-CM | POA: Diagnosis not present

## 2019-05-04 DIAGNOSIS — M545 Low back pain: Secondary | ICD-10-CM | POA: Diagnosis not present

## 2019-05-04 NOTE — Telephone Encounter (Signed)
She was prescribed gabapentin 300 mg to take at bedtime before she discontinue it. She has refills at her pharmacy for gabapentin 300 mg to continue taking 1 tab at bedtime.  Thanks, BJ

## 2019-05-06 DIAGNOSIS — M6281 Muscle weakness (generalized): Secondary | ICD-10-CM | POA: Diagnosis not present

## 2019-05-06 DIAGNOSIS — R262 Difficulty in walking, not elsewhere classified: Secondary | ICD-10-CM | POA: Diagnosis not present

## 2019-05-06 DIAGNOSIS — R2681 Unsteadiness on feet: Secondary | ICD-10-CM | POA: Diagnosis not present

## 2019-05-06 DIAGNOSIS — M545 Low back pain: Secondary | ICD-10-CM | POA: Diagnosis not present

## 2019-05-09 ENCOUNTER — Other Ambulatory Visit: Payer: Self-pay | Admitting: Family Medicine

## 2019-05-09 ENCOUNTER — Telehealth: Payer: Medicare Other | Admitting: Neurology

## 2019-05-13 ENCOUNTER — Other Ambulatory Visit: Payer: Self-pay | Admitting: Family Medicine

## 2019-05-16 ENCOUNTER — Encounter: Payer: Self-pay | Admitting: Neurology

## 2019-05-16 ENCOUNTER — Other Ambulatory Visit: Payer: Self-pay

## 2019-05-16 ENCOUNTER — Encounter

## 2019-05-16 ENCOUNTER — Ambulatory Visit (INDEPENDENT_AMBULATORY_CARE_PROVIDER_SITE_OTHER): Payer: Medicare Other | Admitting: Neurology

## 2019-05-16 VITALS — BP 125/75 | HR 88 | Temp 97.1°F | Ht 60.5 in | Wt 137.0 lb

## 2019-05-16 DIAGNOSIS — R296 Repeated falls: Secondary | ICD-10-CM

## 2019-05-16 DIAGNOSIS — D329 Benign neoplasm of meninges, unspecified: Secondary | ICD-10-CM | POA: Diagnosis not present

## 2019-05-16 DIAGNOSIS — R202 Paresthesia of skin: Secondary | ICD-10-CM

## 2019-05-16 NOTE — Patient Instructions (Addendum)
1. Schedule routine EEG. Our office will call and schedule this for you.  2. Schedule MRI brain with and without contrast. [facility] will call you and schedule this. [phone]. You may call them and schedule if you like. 3. Follow-up in 6 months, call for any changes
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
## 2019-05-18 ENCOUNTER — Ambulatory Visit (INDEPENDENT_AMBULATORY_CARE_PROVIDER_SITE_OTHER): Payer: Medicare Other | Admitting: Neurology

## 2019-05-18 ENCOUNTER — Other Ambulatory Visit: Payer: Self-pay

## 2019-05-18 DIAGNOSIS — R202 Paresthesia of skin: Secondary | ICD-10-CM

## 2019-05-18 DIAGNOSIS — D329 Benign neoplasm of meninges, unspecified: Secondary | ICD-10-CM

## 2019-05-18 DIAGNOSIS — R296 Repeated falls: Secondary | ICD-10-CM

## 2019-05-19 DIAGNOSIS — R262 Difficulty in walking, not elsewhere classified: Secondary | ICD-10-CM | POA: Diagnosis not present

## 2019-05-19 DIAGNOSIS — M545 Low back pain: Secondary | ICD-10-CM | POA: Diagnosis not present

## 2019-05-19 DIAGNOSIS — R2681 Unsteadiness on feet: Secondary | ICD-10-CM | POA: Diagnosis not present

## 2019-05-19 DIAGNOSIS — M6281 Muscle weakness (generalized): Secondary | ICD-10-CM | POA: Diagnosis not present

## 2019-05-19 NOTE — Procedures (Signed)
ELECTROENCEPHALOGRAM REPORT  Date of Study: 05/18/2019  Patient's Name: Tracey Armstrong MRN: [id number] Date of Birth: May 01, 1935  Referring Provider: Dr. Ellouise Newer  Clinical History: This Is an 84 year old woman with episodes where she feels an electric current through her body leading to falls. EEG for classification.  Medications: XANAX) 1 MG tablet NEURONTIN) 300 MG capsule PREVACID) 15 MG capsule Magnesium 250 MG TABS MAXALT-MLT 10 MG disintegrating tablet MEDROL) 4 MG tablet DITROPAN-XL) 5 MG 24 hr tablet Riboflavin 400 MG CAPS ZANAFLEX) 4 MG tablet (RETIN-A) 0.1 % cream Turmeric 500 MG CAPS VERELAN PM) 180 MG 24 hr capsule DRISDOL) 50000 units CAPS capsule  Technical Summary: A multichannel digital EEG recording measured by the international 10-20 system with electrodes applied with paste and impedances below 5000 ohms performed in our laboratory with EKG monitoring in an awake and drowsy patient.  Hyperventilation was not performed.Photic stimulation was performed.  The digital EEG was referentially recorded, reformatted, and digitally filtered in a variety of bipolar and referential montages for optimal display.    Description: The patient is awake and drowsy during the recording.  During maximal wakefulness, there is a symmetric, medium voltage 10-10.5 Hz posterior dominant rhythm that attenuates with eye opening.  The record is symmetric.  During drowsiness, there is an increase in theta slowing of the background. Deeper stages of sleep were not captured. Photic stimulation did not elicit any abnormalities.  There were no epileptiform discharges or electrographic seizures seen.    EKG lead was unremarkable.  Impression: This awake and drowsy EEG is normal.    Clinical Correlation: A normal EEG does not exclude a clinical diagnosis of epilepsy.  If further clinical questions remain, prolonged EEG may be helpful.  Clinical correlation is advised.   Ellouise Newer,  M.D.

## 2019-05-20 DIAGNOSIS — R262 Difficulty in walking, not elsewhere classified: Secondary | ICD-10-CM | POA: Diagnosis not present

## 2019-05-20 DIAGNOSIS — M6281 Muscle weakness (generalized): Secondary | ICD-10-CM | POA: Diagnosis not present

## 2019-05-20 DIAGNOSIS — M545 Low back pain: Secondary | ICD-10-CM | POA: Diagnosis not present

## 2019-05-20 DIAGNOSIS — R2681 Unsteadiness on feet: Secondary | ICD-10-CM | POA: Diagnosis not present

## 2019-05-23 ENCOUNTER — Telehealth: Payer: Self-pay

## 2019-05-23 DIAGNOSIS — M6281 Muscle weakness (generalized): Secondary | ICD-10-CM | POA: Diagnosis not present

## 2019-05-23 DIAGNOSIS — R262 Difficulty in walking, not elsewhere classified: Secondary | ICD-10-CM | POA: Diagnosis not present

## 2019-05-23 DIAGNOSIS — M545 Low back pain: Secondary | ICD-10-CM | POA: Diagnosis not present

## 2019-05-23 DIAGNOSIS — R2681 Unsteadiness on feet: Secondary | ICD-10-CM | POA: Diagnosis not present

## 2019-05-23 NOTE — Telephone Encounter (Signed)
Left message for pt to return call.

## 2019-05-23 NOTE — Telephone Encounter (Signed)
-----   Message from Cameron Sprang, MD sent at 05/23/2019 10:00 AM EDT ----- Pls let her know the brain wave test was normal, proceed with MRI brain as discussed, thanks

## 2019-05-23 NOTE — Telephone Encounter (Signed)
Pt informed of normal EEG. MRI has been scheduled for 06/01/19

## 2019-05-25 DIAGNOSIS — R2681 Unsteadiness on feet: Secondary | ICD-10-CM | POA: Diagnosis not present

## 2019-05-25 DIAGNOSIS — M545 Low back pain: Secondary | ICD-10-CM | POA: Diagnosis not present

## 2019-05-25 DIAGNOSIS — M6281 Muscle weakness (generalized): Secondary | ICD-10-CM | POA: Diagnosis not present

## 2019-05-25 DIAGNOSIS — R262 Difficulty in walking, not elsewhere classified: Secondary | ICD-10-CM | POA: Diagnosis not present

## 2019-05-26 DIAGNOSIS — M545 Low back pain: Secondary | ICD-10-CM | POA: Diagnosis not present

## 2019-05-26 DIAGNOSIS — R262 Difficulty in walking, not elsewhere classified: Secondary | ICD-10-CM | POA: Diagnosis not present

## 2019-05-26 DIAGNOSIS — M6281 Muscle weakness (generalized): Secondary | ICD-10-CM | POA: Diagnosis not present

## 2019-05-26 DIAGNOSIS — R2681 Unsteadiness on feet: Secondary | ICD-10-CM | POA: Diagnosis not present

## 2019-05-27 ENCOUNTER — Other Ambulatory Visit: Payer: Self-pay | Admitting: Family Medicine

## 2019-05-27 DIAGNOSIS — IMO0002 Reserved for concepts with insufficient information to code with codable children: Secondary | ICD-10-CM

## 2019-05-27 DIAGNOSIS — G43709 Chronic migraine without aura, not intractable, without status migrainosus: Secondary | ICD-10-CM

## 2019-05-30 ENCOUNTER — Other Ambulatory Visit: Payer: Self-pay | Admitting: Family Medicine

## 2019-05-30 DIAGNOSIS — R2681 Unsteadiness on feet: Secondary | ICD-10-CM | POA: Diagnosis not present

## 2019-05-30 DIAGNOSIS — I10 Essential (primary) hypertension: Secondary | ICD-10-CM

## 2019-05-30 DIAGNOSIS — M6281 Muscle weakness (generalized): Secondary | ICD-10-CM | POA: Diagnosis not present

## 2019-05-30 DIAGNOSIS — M545 Low back pain: Secondary | ICD-10-CM | POA: Diagnosis not present

## 2019-05-30 DIAGNOSIS — R262 Difficulty in walking, not elsewhere classified: Secondary | ICD-10-CM | POA: Diagnosis not present

## 2019-06-01 ENCOUNTER — Ambulatory Visit
Admission: RE | Admit: 2019-06-01 | Discharge: 2019-06-01 | Disposition: A | Discharge status: Home / Self Care | Payer: Medicare Other | Source: Ambulatory Visit | Attending: Neurology | Admitting: Neurology

## 2019-06-01 DIAGNOSIS — R296 Repeated falls: Secondary | ICD-10-CM

## 2019-06-01 DIAGNOSIS — D329 Benign neoplasm of meninges, unspecified: Secondary | ICD-10-CM

## 2019-06-01 DIAGNOSIS — R202 Paresthesia of skin: Secondary | ICD-10-CM

## 2019-06-01 DIAGNOSIS — D320 Benign neoplasm of cerebral meninges: Secondary | ICD-10-CM | POA: Diagnosis not present

## 2019-06-01 MED ORDER — GADOBENATE DIMEGLUMINE 529 MG/ML IV SOLN
12.0000 mL | Freq: Once | INTRAVENOUS | Status: AC | PRN
  Administered 2019-06-01: 12 mL via INTRAVENOUS

## 2019-06-03 DIAGNOSIS — M6281 Muscle weakness (generalized): Secondary | ICD-10-CM | POA: Diagnosis not present

## 2019-06-03 DIAGNOSIS — R262 Difficulty in walking, not elsewhere classified: Secondary | ICD-10-CM | POA: Diagnosis not present

## 2019-06-03 DIAGNOSIS — R2681 Unsteadiness on feet: Secondary | ICD-10-CM | POA: Diagnosis not present

## 2019-06-03 DIAGNOSIS — M545 Low back pain: Secondary | ICD-10-CM | POA: Diagnosis not present

## 2019-06-06 ENCOUNTER — Telehealth: Payer: Self-pay

## 2019-06-06 NOTE — Telephone Encounter (Signed)
Left message informing pt of normal MRI results and to call with any concerns.

## 2019-06-06 NOTE — Telephone Encounter (Signed)
Patient left VM that she was returning your call. Thanks!

## 2019-06-06 NOTE — Telephone Encounter (Signed)
-----   Message from Cameron Sprang, MD sent at 06/05/2019 11:32 PM EDT ----- Pls let patient know the MRI brain did not show any changes from prior MRI in 2017. No evidence of stroke, new tumor, or bleed. Thanks

## 2019-06-06 NOTE — Telephone Encounter (Signed)
Pt will contact medical records about getting MRI report

## 2019-06-09 DIAGNOSIS — M545 Low back pain: Secondary | ICD-10-CM | POA: Diagnosis not present

## 2019-06-09 DIAGNOSIS — R262 Difficulty in walking, not elsewhere classified: Secondary | ICD-10-CM | POA: Diagnosis not present

## 2019-06-09 DIAGNOSIS — M6281 Muscle weakness (generalized): Secondary | ICD-10-CM | POA: Diagnosis not present

## 2019-06-09 DIAGNOSIS — R2681 Unsteadiness on feet: Secondary | ICD-10-CM | POA: Diagnosis not present

## 2019-06-13 DIAGNOSIS — M545 Low back pain: Secondary | ICD-10-CM | POA: Diagnosis not present

## 2019-06-13 DIAGNOSIS — R262 Difficulty in walking, not elsewhere classified: Secondary | ICD-10-CM | POA: Diagnosis not present

## 2019-06-13 DIAGNOSIS — R2681 Unsteadiness on feet: Secondary | ICD-10-CM | POA: Diagnosis not present

## 2019-06-13 DIAGNOSIS — M6281 Muscle weakness (generalized): Secondary | ICD-10-CM | POA: Diagnosis not present

## 2019-06-15 DIAGNOSIS — R2681 Unsteadiness on feet: Secondary | ICD-10-CM | POA: Diagnosis not present

## 2019-06-15 DIAGNOSIS — R262 Difficulty in walking, not elsewhere classified: Secondary | ICD-10-CM | POA: Diagnosis not present

## 2019-06-15 DIAGNOSIS — M545 Low back pain: Secondary | ICD-10-CM | POA: Diagnosis not present

## 2019-06-15 DIAGNOSIS — M6281 Muscle weakness (generalized): Secondary | ICD-10-CM | POA: Diagnosis not present

## 2019-06-20 DIAGNOSIS — M6281 Muscle weakness (generalized): Secondary | ICD-10-CM | POA: Diagnosis not present

## 2019-06-20 DIAGNOSIS — M545 Low back pain: Secondary | ICD-10-CM | POA: Diagnosis not present

## 2019-06-20 DIAGNOSIS — R2681 Unsteadiness on feet: Secondary | ICD-10-CM | POA: Diagnosis not present

## 2019-06-20 DIAGNOSIS — R262 Difficulty in walking, not elsewhere classified: Secondary | ICD-10-CM | POA: Diagnosis not present

## 2019-07-13 DIAGNOSIS — Z119 Encounter for screening for infectious and parasitic diseases, unspecified: Secondary | ICD-10-CM | POA: Diagnosis not present

## 2019-07-14 ENCOUNTER — Ambulatory Visit: Payer: Medicare Other | Admitting: Physician Assistant

## 2019-07-18 ENCOUNTER — Ambulatory Visit (INDEPENDENT_AMBULATORY_CARE_PROVIDER_SITE_OTHER): Payer: Medicare Other | Admitting: Physician Assistant

## 2019-07-18 ENCOUNTER — Encounter: Payer: Self-pay | Admitting: Physician Assistant

## 2019-07-18 DIAGNOSIS — M7062 Trochanteric bursitis, left hip: Secondary | ICD-10-CM | POA: Diagnosis not present

## 2019-07-18 MED ORDER — LIDOCAINE HCL 1 % IJ SOLN
3.0000 mL | INTRAMUSCULAR | Status: AC | PRN
  Administered 2019-07-18: 3 mL

## 2019-07-18 MED ORDER — METHYLPREDNISOLONE ACETATE 40 MG/ML IJ SUSP
40.0000 mg | INTRAMUSCULAR | Status: AC | PRN
  Administered 2019-07-18: 40 mg via INTRA_ARTICULAR

## 2019-07-18 MED ORDER — PREGABALIN 50 MG PO CAPS: 50.0000 mg | ORAL_CAPSULE | Freq: Three times a day (TID) | ORAL | 1 refills | Status: DC

## 2019-07-18 NOTE — Progress Notes (Signed)
Office Visit Note   Patient: Tracey Armstrong           Date of Birth: Mar 04, 1935           MRN: [id number] Visit Date: 07/18/2019              Requested by: Martinique, [name], MD [address] [place],  [place] [address] PCP: Martinique, [name], MD   Assessment & Plan: Visit Diagnoses:  1. Trochanteric bursitis, left hip     Plan: We will refer back to Dr. Ernestina Patches for possible spinal epidural steroid injection lumbar spine.  In regards to the hip bursitis might further try some IT band stretching exercises as shown today.  Questions were encouraged and answered at length.  Follow-Up Instructions: Return if symptoms worsen or fail to improve.   Orders:  Orders Placed This Encounter  Procedures  . Large Joint Inj   Meds ordered this encounter  Medications  . pregabalin (LYRICA) 50 MG capsule    Sig: Take 1 capsule (50 mg total) by mouth 3 (three) times daily.    Dispense:  90 capsule    Refill:  1      Procedures: Large Joint Inj: L greater trochanter on 07/18/2019 3:24 PM Indications: pain Details: 22 G 1.5 in needle, lateral approach  Arthrogram: No  Medications: 3 mL lidocaine 1 %; 40 mg methylPREDNISolone acetate 40 MG/ML Outcome: tolerated well, no immediate complications Procedure, treatment alternatives, risks and benefits explained, specific risks discussed. Consent was given by the patient. Immediately prior to procedure a time out was called to verify the correct patient, procedure, equipment, support staff and site/side marked as required. Patient was prepped and draped in the usual sterile fashion.       Clinical Data: No additional findings.   Subjective: Chief Complaint  Patient presents with  . Left Knee - Pain    HPI Tracey Armstrong is well-known to Dr. Ninfa Linden service comes in today with pain in her left leg.  States it is muscle like pain.  She is also having some back pain is wanting to see Dr. Ernestina Patches.  She is had previous epidural steroid  injections with Dr. Ernestina Patches and would like to go back for additional injections.  She takes Neurontin for nerve pain but states this is not helping with like to try Lyrica. Review of Systems No fevers chills shortness of breath.  No acute injury.  Objective: Vital Signs: There were no vitals taken for this visit.  Physical Exam General: Well-developed well-nourished female in no acute distress. Ortho Exam Bilateral hips good range of motion both hips without pain.  Tenderness left trochanteric region down the IT band. Specialty Comments:  No specialty comments available.  Imaging: No results found.   PMFS History: Patient Active Problem List   Diagnosis Date Noted  . Urine, incontinence, stress female 02/07/2019  . Knee osteoarthritis 02/01/2018  . Iron deficiency anemia 10/28/2017  . Rhinitis, allergic 07/27/2017  . GERD (gastroesophageal reflux disease) 06/16/2017  . Chronic pain disorder 06/16/2017  . Vitamin D deficiency 05/25/2017  . Hyperlipidemia 05/25/2017  . 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 Pain 02/26/2017  . Chronic low back pain 12/02/2016  . Abdominal pain 03/26/2016  . Essential (primary) hypertension 03/26/2016  . 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 Peptic esophagitis 03/26/2016  . Neuralgia neuritis, sciatic nerve 03/26/2016  . 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[facility]) 06/20/2015  . Chronic insomnia 03/30/2015  . Chronic migraine 01/10/2015  . Depression 04/20/2014  . Generalized  anxiety disorder 04/20/2014  . Chronic back pain 04/20/2014  . Chronic daily headache 04/20/2014  . Essential hypertension 04/20/2014  . Migraine headache 04/19/2014   Past Medical History:  Diagnosis Date  . Anxiety   . Arthritis   . Chronic insomnia 03/30/2015  . Chronic low back pain 12/02/2016  . Depression   . GERD (gastroesophageal reflux disease)   . Hiatal hernia   . High cholesterol   . Hypertelorism   . Hypertension   . Meningioma ([place])   . Migraine     Family History   Problem Relation Age of Onset  . Migraines Father   . Stroke Father   . Heart Problems Mother   . Epilepsy Brother     Past Surgical History:  Procedure Laterality Date  . STOMACH SURGERY     Social History   Occupational History  . Occupation: Retired  Tobacco Use  . Smoking status: Never Smoker  . Smokeless tobacco: Never Used  Substance and Sexual Activity  . Alcohol use: No  . Drug use: No  . Sexual activity: Not Currently

## 2019-08-03 ENCOUNTER — Telehealth: Payer: Self-pay | Admitting: Physical Medicine and Rehabilitation

## 2019-08-03 NOTE — Telephone Encounter (Signed)
Last injection was end of September with Dr. Ninfa Linden which was a steroid injection which is the same thing is cortisone into the greater trochanteric area.  The injection prior to that was the same type of injection performed by myself which again was a steroid/cortisone injection into the greater trochanter.  The only thing I would have to offer is a transforaminal epidural injection which is a steroid/cortisone injection.  She can decide which when she would like to try otherwise I have nothing left to offer.

## 2019-08-03 NOTE — Telephone Encounter (Signed)
Scheduled for ESI on 11/4 with driver.

## 2019-08-12 ENCOUNTER — Other Ambulatory Visit: Payer: Self-pay | Admitting: Family Medicine

## 2019-08-12 NOTE — Telephone Encounter (Signed)
Pt has an appt. 08/17/2019. Will refill then.

## 2019-08-17 ENCOUNTER — Ambulatory Visit (INDEPENDENT_AMBULATORY_CARE_PROVIDER_SITE_OTHER): Payer: Medicare Other | Admitting: Family Medicine

## 2019-08-17 ENCOUNTER — Encounter: Payer: Self-pay | Admitting: Family Medicine

## 2019-08-17 ENCOUNTER — Other Ambulatory Visit: Payer: Self-pay

## 2019-08-17 VITALS — BP 122/62 | HR 64 | Ht 60.5 in

## 2019-08-17 DIAGNOSIS — N393 Stress incontinence (female) (male): Secondary | ICD-10-CM

## 2019-08-17 DIAGNOSIS — F411 Generalized anxiety disorder: Secondary | ICD-10-CM | POA: Diagnosis not present

## 2019-08-17 DIAGNOSIS — IMO0002 Reserved for concepts with insufficient information to code with codable children: Secondary | ICD-10-CM

## 2019-08-17 DIAGNOSIS — I10 Essential (primary) hypertension: Secondary | ICD-10-CM | POA: Diagnosis not present

## 2019-08-17 DIAGNOSIS — N289 Disorder of kidney and ureter, unspecified: Secondary | ICD-10-CM | POA: Diagnosis not present

## 2019-08-17 DIAGNOSIS — G43709 Chronic migraine without aura, not intractable, without status migrainosus: Secondary | ICD-10-CM

## 2019-08-17 LAB — MICROALBUMIN / CREATININE URINE RATIO
Creatinine,U: 52.7 mg/dL
Microalb Creat Ratio: 1.3 mg/g (ref 0.0–30.0)
Microalb, Ur: <0.7 mg/dL (ref 0.0–1.9)

## 2019-08-17 MED ORDER — OXYBUTYNIN CHLORIDE ER 10 MG PO TB24: 10.0000 mg | ORAL_TABLET | Freq: Every day | ORAL | 1 refills | Status: DC

## 2019-08-17 NOTE — Patient Instructions (Signed)
A few things to remember from today's visit:   Chronic migraine  Essential (primary) hypertension - Plan: Basic metabolic panel  Generalized anxiety disorder  Urine, incontinence, stress female  Abnormal renal function - Plan: Microalbumin / creatinine urine ratio  Oxybutynin dose increased.  Please be sure medication list is accurate. If a new problem present, please set up appointment sooner than planned today.

## 2019-08-17 NOTE — Progress Notes (Signed)
HPI:   Tracey Armstrong is a 84 y.o. female, who is here today for chronic disease management. She was last seen on 04/18/19. She has had some dental problems, she had a root canal yesterday.  Dizziness today am. Intermittent. She feels like "babbles" in her left ear. No changes in hearing,ear drainage,sore throat,nasal congestion,or rhinorrhea. No recent URI or travel.  She has had vertigo in the past, she is not having spinning like sensation but rather feeling unbalance.  No associated sx.  Anxiety: Currently he is on Xanax 0.5 mg bid prn. Problem exacerbated by COVID 19 pandemia. She is in independent living and has to be in her room "all the time."  Also frustrated because she has gained wt. Feels "depressed."  Urinary frequency: Problem improved after starting Oxybutynin XL 5 mg, still having symptoms ,so sometimes she has taken 2 tabs. Nocturia x 1 Urinary frequency still present during the day. Denies dysuria or gross hematuria. Mild dry mouth but otherwise medication has been well tolerated.   Since her last she has seen ortho, recommended Lyrica 50 mg tid,she stopped it. Lyrica is not helping with LLE pain. Next Wednesday she has f/u appt. Denies saddle anesthesia or changes in bowel.\/ Zanaflex is helping, she take 4 mg tid.  Migraine/headache: She is having less episodes. She states that Maxalt 10 mg x 12 tabs last several months. She is also taking Mg oxide that seems to help also. Last episode was last week.  No new associated symptoms.  HTN: She is on Verapamil 180 mg daily. She is not checking BP regularly. She has not noted CP,SOB,palpitation,sor edema. Last BMP mildly abnormal. 07/26/18 e GFR 40.6  Component     Latest Ref Rng & Units 04/05/2019  Sodium     135 - 145 mEq/L 134 (L)  Potassium     3.5 - 5.1 mEq/L 4.8  Chloride     96 - 112 mEq/L 100  CO2     19 - 32 mEq/L 26  Glucose     70 - 99 mg/dL 77  BUN     6 - 23 mg/dL  20  Creatinine     0.40 - 1.20 mg/dL 1.00  Calcium     8.4 - 10.5 mg/dL 9.5  GFR     >60.00 mL/min 53.04 (L)    Review of Systems  Constitutional: Positive for fatigue. Negative for activity change, appetite change and fever.  HENT: Negative for mouth sores, nosebleeds and trouble swallowing.   Eyes: Negative for redness and visual disturbance.  Respiratory: Negative for cough and wheezing.   Gastrointestinal: Negative for abdominal pain, nausea and vomiting.       Negative for changes in bowel habits.  Endocrine: Negative for cold intolerance and heat intolerance.  Genitourinary: Negative for decreased urine volume, flank pain and pelvic pain.  Neurological: Negative for syncope and facial asymmetry.  Psychiatric/Behavioral: Negative for confusion. The patient is nervous/anxious.   Rest see pertinent positives and negatives per HPI.   Current Outpatient Medications on File Prior to Visit  Medication Sig Dispense Refill  . lansoprazole (PREVACID) 15 MG capsule 1 tab at 7:00 am (30 min before breakfast). 90 capsule 1  . Magnesium 250 MG TABS Take 250 mg by mouth daily. 2 PO DAILY    . MAXALT-MLT 10 MG disintegrating tablet DISSOLVE ONE TABLET BY MOUTH AS NEEDED HEADACHE 12 tablet 1  . methylPREDNISolone (MEDROL) 4 MG tablet Medrol dose pack. Take as instructed 21  tablet 0  . pregabalin (LYRICA) 50 MG capsule Take 1 capsule (50 mg total) by mouth 3 (three) times daily. 90 capsule 1  . Riboflavin 400 MG CAPS Take 1 capsule (400 mg total) by mouth daily.    Marland Kitchen tiZANidine (ZANAFLEX) 4 MG tablet TAKE 1 TABLET BY MOUTH THREE TIMES DAILY AS NEEDED.BETWEEN MEALS AND BEDTIME FOR MUSCLE SPASMS 90 tablet 2  . tretinoin (RETIN-A) 0.1 % cream APPLY TO AFFECTED AREA EVERY DAY AT BEDTIME 45 g 2  . Turmeric 500 MG CAPS Take 1 capsule by mouth daily.    . verapamil (VERELAN PM) 180 MG 24 hr capsule TAKE 1 CAPSULE(180 MG) BY MOUTH DAILY 90 capsule 2  . Vitamin D, Ergocalciferol, (DRISDOL) 50000 units  CAPS capsule 1 cap every 2 weeks. 12 capsule 3   No current facility-administered medications on file prior to visit.      Past Medical History:  Diagnosis Date  . Anxiety   . Arthritis   . Chronic insomnia 03/30/2015  . Chronic low back pain 12/02/2016  . Depression   . GERD (gastroesophageal reflux disease)   . Hiatal hernia   . High cholesterol   . Hypertelorism   . Hypertension   . Meningioma ([place])   . Migraine    Allergies  Allergen Reactions  . Dihydrotachysterol     Other reaction(s): Other (See Comments)  . Trazodone And Nefazodone Other (See Comments)    Worsens headaches  . Valium [Diazepam] Other (See Comments)    headaches  . Citalopram     Other reaction(s): Headache  . Other     "States all antidepressants cause migraines."    Social History   Socioeconomic History  . Marital status: Widowed    Spouse name: Not on file  . Number of children: 4  . Years of education: 48  . Highest education level: Not on file  Occupational History  . Occupation: Retired  Scientific laboratory technician  . Financial resource strain: Not on file  . Food insecurity    Worry: Not on file    Inability: Not on file  . Transportation needs    Medical: Not on file    Non-medical: Not on file  Tobacco Use  . Smoking status: Never Smoker  . Smokeless tobacco: Never Used  Substance and Sexual Activity  . Alcohol use: No  . Drug use: No  . Sexual activity: Not Currently  Lifestyle  . Physical activity    Days per week: Not on file    Minutes per session: Not on file  . Stress: Not on file  Relationships  . Social Herbalist on phone: Not on file    Gets together: Not on file    Attends religious service: Not on file    Active member of club or organization: Not on file    Attends meetings of clubs or organizations: Not on file    Relationship status: Not on file  Other Topics Concern  . Not on file  Social History Narrative   Lives in [place]      Patient  drinks 1 glass of tea daily.      Patient is right handed.    Vitals:   08/17/19 0950  BP: 122/62  Pulse: 64  SpO2: 98%   Body mass index is 26.32 kg/m.   Physical Exam  Nursing note and vitals reviewed. Constitutional: She is oriented to person, place, and time. She appears well-developed and well-nourished. No distress.  HENT:  Head: Normocephalic and atraumatic.  Mouth/Throat: Oropharynx is clear and moist and mucous membranes are normal.  Eyes: Pupils are equal, round, and reactive to light. Conjunctivae are normal.  Cardiovascular: Normal rate and regular rhythm.  No murmur heard. Pulses:      Dorsalis pedis pulses are 2+ on the right side and 2+ on the left side.  Respiratory: Effort normal and breath sounds normal. No respiratory distress.  GI: Soft. She exhibits no mass. There is no hepatomegaly. There is no abdominal tenderness.  Musculoskeletal:        General: No edema.     Lumbar back: She exhibits no tenderness and no bony tenderness.  Lymphadenopathy:    She has no cervical adenopathy.  Neurological: She is alert and oriented to person, place, and time. She has normal strength. No cranial nerve deficit. Gait normal.  Skin: Skin is warm. No rash noted. No erythema.  Psychiatric: Her mood appears anxious.  Well groomed, good eye contact.    ASSESSMENT AND PLAN:  Tracey Armstrong was seen today for follow-up.  Diagnoses and all orders for this visit:  Lab Results  Component Value Date   MICROALBUR <0.7 08/17/2019    Chronic migraine Reporting improvement. No changes in current management. Instructed about warning signs.  Essential (primary) hypertension BP adequately controlled. No changes in current management. Recommend monitoring BP regularly. Low salt recommended.  -     Basic metabolic panel  Generalized anxiety disorder Mildly worse since COVID 19 pandemia started. She has not tolerated anxiolytic medications and she is not interested in trying  a different medication. Side effects of xanax discussed. No changes in current management.  -     ALPRAZolam (XANAX) 1 MG tablet; TAKE 1/2 TABLET(0.5 MG) BY MOUTH TWICE DAILY AS NEEDED FOR ANXIETY  Urine, incontinence, stress female Problem still not well controlled. Side effects of medication discussed. Oxybutynin XL dose increased from 5 mg to 10 mg daily.  -     oxybutynin (DITROPAN XL) 10 MG 24 hr tablet; Take 1 tablet (10 mg total) by mouth at bedtime.  Abnormal renal function She has had 2 abnormal e GFR. Low salt diet and avoidance of NSAID's.  -     Microalbumin / creatinine urine ratio   Return in about 4 months (around 12/18/2019).   -Tracey Armstrong was advised to return sooner than planned today if new concerns arise.    [name]. Martinique, MD  [facility]. [place] office.

## 2019-08-18 ENCOUNTER — Telehealth: Payer: Self-pay | Admitting: Family Medicine

## 2019-08-18 DIAGNOSIS — M545 Low back pain, unspecified: Secondary | ICD-10-CM

## 2019-08-18 DIAGNOSIS — G8929 Other chronic pain: Secondary | ICD-10-CM

## 2019-08-18 NOTE — Telephone Encounter (Signed)
Requested medication (s) are due for refill today: yes  Requested medication (s) are on the active medication list: yes  Last refill:  05/09/2019  Future visit scheduled: yes  Notes to clinic:  Refills cannot be delegated   Requested Prescriptions  Pending Prescriptions Disp Refills   ALPRAZolam (XANAX) 1 MG tablet 30 tablet 2     Not Delegated - Psychiatry:  Anxiolytics/Hypnotics Failed - 08/18/2019 12:20 PM      Failed - This refill cannot be delegated      Failed - Urine Drug Screen completed in last 360 days.      Passed - Valid encounter within last 6 months    Recent Outpatient Visits          Yesterday Chronic migraine   Therapist, music at [facility] [place], Malka So, MD   4 months ago Chronic low back pain, unspecified back pain laterality, unspecified whether sciatica present   Therapist, music at [facility] [place], Malka So, MD   4 months ago Science writer at [facility] [place], Malka So, MD   4 months ago Generalized anxiety disorder   Therapist, music at [facility] [place], Malka So, MD   5 months ago Essential (primary) hypertension   Therapist, music at [facility] [place], Malka So, MD      Future Appointments            In 4 months Martinique, Malka So, MD [place] at [place], [facility]            pregabalin (LYRICA) 50 MG capsule 90 capsule 1    Sig: Take 1 capsule (50 mg total) by mouth 3 (three) times daily.     Not Delegated - Neurology:  Anticonvulsants - Controlled Failed - 08/18/2019 12:20 PM      Failed - This refill cannot be delegated      Passed - Valid encounter within last 12 months    Recent Outpatient Visits          Yesterday Chronic migraine   [place] at [facility] [place], Malka So, MD   4 months ago Chronic low back pain, unspecified back pain laterality, unspecified whether sciatica present   Therapist, music at [facility] [place], Malka So, MD   4 months ago Investment banker, operational at [facility] [place], Malka So, MD   4 months ago Generalized anxiety disorder   Therapist, music at [facility] [place], Malka So, MD   5 months ago Essential (primary) hypertension   Therapist, music at [facility] [place], Malka So, MD      Future Appointments            In 4 months Martinique, Malka So, MD [place] [facility] [place], [place]            tiZANidine (ZANAFLEX) 4 MG tablet 90 tablet 2     Not Delegated - Cardiovascular:  Alpha-2 Agonists - tizanidine Failed - 08/18/2019 12:20 PM      Failed - This refill cannot be delegated      Passed - Valid encounter within last 6 months    Recent Outpatient Visits          Yesterday Chronic migraine   [place] at [facility] [place], Malka So, MD   4 months ago Chronic low back pain, unspecified back pain laterality, unspecified whether sciatica present   Therapist, music at [facility] [place], Malka So, MD   4 months ago Science writer at [employer] [facility]  Martin[place], [name], MD   4 months ago Generalized anxiety disorder   Therapist, music at [facility] [place], Malka So, MD   5 months ago Essential (primary) hypertension   Therapist, music at [facility] [place], Malka So, MD      Future Appointments            In 4 months Martinique, Malka So, MD [employer] at [place], [facility]

## 2019-08-18 NOTE — Telephone Encounter (Signed)
Medication Refill - Medication: ALPRAZolam (XANAX) 1 MG tablet tiZANidine (ZANAFLEX) 4 MG tablet  pregabalin (LYRICA) 50 MG capsule    Has the patient contacted their pharmacy? Yes.   (Agent: If no, request that the patient contact the pharmacy for the refill.) (Agent: If yes, when and what did the pharmacy advise?)  Preferred Pharmacy (with phone number or street name):  [facility], [place] - [address]  [place] [address]  Phone: [phone] Fax: [phone]     Agent: Please be advised that RX refills may take up to 3 business days. We ask that you follow-up with your pharmacy.

## 2019-08-19 ENCOUNTER — Telehealth: Payer: Self-pay

## 2019-08-19 NOTE — Telephone Encounter (Signed)
Forwarding to PCP.

## 2019-08-19 NOTE — Telephone Encounter (Signed)
Pt request sent to provider, pt is aware

## 2019-08-19 NOTE — Telephone Encounter (Signed)
Patient checking on the status of medication mentioned below, patient states she was seen on 08/17/2019 and was promised it would be taken are of as soon as possible, patient completely out, please advise patient directly [place], [place] [phone] (Phone) [phone] (Fax)

## 2019-08-20 ENCOUNTER — Encounter: Payer: Self-pay | Admitting: Family Medicine

## 2019-08-20 MED ORDER — ALPRAZOLAM 1 MG PO TABS: ORAL_TABLET | ORAL | 2 refills | Status: DC

## 2019-08-22 ENCOUNTER — Other Ambulatory Visit: Payer: Self-pay | Admitting: Family Medicine

## 2019-08-22 DIAGNOSIS — G8929 Other chronic pain: Secondary | ICD-10-CM

## 2019-08-22 DIAGNOSIS — M545 Low back pain, unspecified: Secondary | ICD-10-CM

## 2019-08-22 MED ORDER — TIZANIDINE HCL 4 MG PO TABS: 4.0000 mg | ORAL_TABLET | Freq: Two times a day (BID) | ORAL | 2 refills | Status: DC | PRN

## 2019-08-22 NOTE — Telephone Encounter (Signed)
Xanax was sent to her pharmacy on 08/20/19. New prescription for sildenafil was sent to continue 4 mg twice daily as needed. Thanks, BJ

## 2019-08-23 NOTE — Telephone Encounter (Signed)
Patient is aware. Nothing further needed.  

## 2019-08-24 ENCOUNTER — Encounter: Payer: Self-pay | Admitting: Physical Medicine and Rehabilitation

## 2019-08-24 ENCOUNTER — Other Ambulatory Visit: Payer: Self-pay

## 2019-08-24 ENCOUNTER — Ambulatory Visit (INDEPENDENT_AMBULATORY_CARE_PROVIDER_SITE_OTHER): Payer: Medicare Other | Admitting: Physical Medicine and Rehabilitation

## 2019-08-24 ENCOUNTER — Ambulatory Visit: Payer: Medicare Other

## 2019-08-24 VITALS — BP 146/74 | HR 82

## 2019-08-24 DIAGNOSIS — M5416 Radiculopathy, lumbar region: Secondary | ICD-10-CM

## 2019-08-24 MED ORDER — BETAMETHASONE SOD PHOS & ACET 6 (3-3) MG/ML IJ SUSP
12.0000 mg | Freq: Once | INTRAMUSCULAR | Status: AC
  Administered 2019-08-24: 12 mg

## 2019-08-24 NOTE — Progress Notes (Signed)
Numeric Pain Rating Scale and Functional Assessment Average Pain 10   In the last MONTH (on 0-10 scale) has pain interfered with the following?  1. General activity like being  able to carry out your everyday physical activities such as walking, climbing stairs, carrying groceries, or moving a chair?  Rating(10)   +Driver, -BT, -Dye Allergies. 

## 2019-08-31 ENCOUNTER — Ambulatory Visit (INDEPENDENT_AMBULATORY_CARE_PROVIDER_SITE_OTHER): Payer: Medicare Other | Admitting: Physician Assistant

## 2019-08-31 ENCOUNTER — Other Ambulatory Visit: Payer: Self-pay

## 2019-08-31 ENCOUNTER — Ambulatory Visit (INDEPENDENT_AMBULATORY_CARE_PROVIDER_SITE_OTHER): Payer: Medicare Other

## 2019-08-31 ENCOUNTER — Encounter: Payer: Self-pay | Admitting: Physician Assistant

## 2019-08-31 DIAGNOSIS — M7062 Trochanteric bursitis, left hip: Secondary | ICD-10-CM

## 2019-08-31 DIAGNOSIS — M79605 Pain in left leg: Secondary | ICD-10-CM

## 2019-08-31 MED ORDER — LIDOCAINE HCL 1 % IJ SOLN
3.0000 mL | INTRAMUSCULAR | Status: AC | PRN
  Administered 2019-08-31: 3 mL

## 2019-08-31 MED ORDER — PREGABALIN 50 MG PO CAPS: 50.0000 mg | ORAL_CAPSULE | Freq: Three times a day (TID) | ORAL | 1 refills | Status: DC

## 2019-08-31 MED ORDER — METHYLPREDNISOLONE ACETATE 40 MG/ML IJ SUSP
40.0000 mg | INTRAMUSCULAR | Status: AC | PRN
  Administered 2019-08-31: 15:00:00 40 mg via INTRA_ARTICULAR

## 2019-08-31 NOTE — Progress Notes (Signed)
Office Visit Note   Patient: Tracey Armstrong           Date of Birth: [birth year]           MRN: [id number] Visit Date: 08/31/2019              Requested by: Martinique, [name], MD [address] [place],  [place] [id number] PCP: Martinique, [name], MD   Assessment & Plan: Visit Diagnoses:  1. Pain in left leg   2. Trochanteric bursitis, left hip     Plan: We refilled her Lyrica.  We will send her to physical therapy for IT band stretching, hamstring stretching, modalities and home exercise program.  She will follow-up with [place] on as-needed basis.  Questions were encouraged and answered at length  Follow-Up Instructions: Return if symptoms worsen or fail to improve.   Orders:  Orders Placed This Encounter  Procedures  . XR FEMUR MIN 2 VIEWS LEFT   Meds ordered this encounter  Medications  . pregabalin (LYRICA) 50 MG capsule    Sig: Take 1 capsule (50 mg total) by mouth 3 (three) times daily.    Dispense:  90 capsule    Refill:  1      Procedures: Large Joint Inj: L greater trochanter on 08/31/2019 2:36 PM Indications: pain Details: 22 G 1.5 in needle, lateral approach  Arthrogram: No  Medications: 3 mL lidocaine 1 %; 40 mg methylPREDNISolone acetate 40 MG/ML Outcome: tolerated well, no immediate complications Procedure, treatment alternatives, risks and benefits explained, specific risks discussed. Consent was given by the patient. Immediately prior to procedure a time out was called to verify the correct patient, procedure, equipment, support staff and site/side marked as required. Patient was prepped and draped in the usual sterile fashion.       Clinical Data: No additional findings.   Subjective: Chief Complaint  Patient presents with  . Left Leg - Pain    HPI [name] is well-known to Dr. Ninfa Linden service comes in today due to left leg pain.  She states she has not slept down the entire left leg and around the knee.  She has had several falls.  One  fall she was trying to help fellow resident at the skilled facility she states that and pain attention to her and fell.  This about 3 weeks ago.  She has had pain in the left leg since that time.  She sees Dr. Ernestina Patches for lumbar radicular pain.  She has known osteoarthritis of the left knee and hip bursitis on the left.  Review of Systems Please see HPI otherwise negative  Objective: Vital Signs: There were no vitals taken for this visit.  Physical Exam Constitutional:      Appearance: She is not ill-appearing or diaphoretic.  Pulmonary:     Effort: Pulmonary effort is normal.  Neurological:     Mental Status: She is alert and oriented to person, place, and time.  Psychiatric:        Mood and Affect: Mood normal.        Behavior: Behavior normal.     Ortho Exam Left hip good range of motion some discomfort with external rotation.  She has tenderness over the trochanteric region.  Right hip good range of motion without pain. Specialty Comments:  No specialty comments available.  Imaging: Xr Femur Min 2 Views Left  Result Date: 08/31/2019 Left femur: No acute fractures.  Left hip swelling located maintained.  Left knee is well located.  Lateral joint line narrowing left knee.  No acute findings.    PMFS History: Patient Active Problem List   Diagnosis Date Noted  . Urine, incontinence, stress female 02/07/2019  . Knee osteoarthritis 02/01/2018  . Iron deficiency anemia 10/28/2017  . Rhinitis, allergic 07/27/2017  . GERD (gastroesophageal reflux disease) 06/16/2017  . Chronic pain disorder 06/16/2017  . Vitamin D deficiency 05/25/2017  . Hyperlipidemia 05/25/2017  . 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 Pain 02/26/2017  . Chronic low back pain 12/02/2016  . Abdominal pain 03/26/2016  . Essential (primary) hypertension 03/26/2016  . 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 Peptic esophagitis 03/26/2016  . Neuralgia neuritis, sciatic nerve 03/26/2016  . 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 Chronic insomnia 03/30/2015  . Chronic migraine 01/10/2015  . Depression 04/20/2014  . Generalized anxiety disorder 04/20/2014  . Chronic back pain 04/20/2014  . Chronic daily headache 04/20/2014  . Essential hypertension 04/20/2014  . Migraine headache 04/19/2014   Past Medical History:  Diagnosis Date  . Anxiety   . Arthritis   . Chronic insomnia 03/30/2015  . Chronic low back pain 12/02/2016  . Depression   . GERD (gastroesophageal reflux disease)   . Hiatal hernia   . High cholesterol   . Hypertelorism   . Hypertension   . Meningioma ([place])   . Migraine     Family History  Problem Relation Age of Onset  . Migraines Father   . Stroke Father   . Heart Problems Mother   . Epilepsy Brother     Past Surgical History:  Procedure Laterality Date  . STOMACH SURGERY     Social History   Occupational History  . Occupation: Retired  Tobacco Use  . Smoking status: Never Smoker  . Smokeless tobacco: Never Used  Substance and Sexual Activity  . Alcohol use: No  . Drug use: No  . Sexual activity: Not Currently

## 2019-09-06 DIAGNOSIS — R2681 Unsteadiness on feet: Secondary | ICD-10-CM | POA: Diagnosis not present

## 2019-09-06 DIAGNOSIS — M6281 Muscle weakness (generalized): Secondary | ICD-10-CM | POA: Diagnosis not present

## 2019-09-06 DIAGNOSIS — M25562 Pain in left knee: Secondary | ICD-10-CM | POA: Diagnosis not present

## 2019-09-06 DIAGNOSIS — M545 Low back pain: Secondary | ICD-10-CM | POA: Diagnosis not present

## 2019-09-08 DIAGNOSIS — M545 Low back pain: Secondary | ICD-10-CM | POA: Diagnosis not present

## 2019-09-08 DIAGNOSIS — R2681 Unsteadiness on feet: Secondary | ICD-10-CM | POA: Diagnosis not present

## 2019-09-08 DIAGNOSIS — M6281 Muscle weakness (generalized): Secondary | ICD-10-CM | POA: Diagnosis not present

## 2019-09-08 DIAGNOSIS — M25562 Pain in left knee: Secondary | ICD-10-CM | POA: Diagnosis not present

## 2019-09-09 DIAGNOSIS — R2681 Unsteadiness on feet: Secondary | ICD-10-CM | POA: Diagnosis not present

## 2019-09-09 DIAGNOSIS — M25562 Pain in left knee: Secondary | ICD-10-CM | POA: Diagnosis not present

## 2019-09-09 DIAGNOSIS — M6281 Muscle weakness (generalized): Secondary | ICD-10-CM | POA: Diagnosis not present

## 2019-09-09 DIAGNOSIS — M545 Low back pain: Secondary | ICD-10-CM | POA: Diagnosis not present

## 2019-09-13 DIAGNOSIS — R2681 Unsteadiness on feet: Secondary | ICD-10-CM | POA: Diagnosis not present

## 2019-09-13 DIAGNOSIS — M6281 Muscle weakness (generalized): Secondary | ICD-10-CM | POA: Diagnosis not present

## 2019-09-13 DIAGNOSIS — M25562 Pain in left knee: Secondary | ICD-10-CM | POA: Diagnosis not present

## 2019-09-13 DIAGNOSIS — M545 Low back pain: Secondary | ICD-10-CM | POA: Diagnosis not present

## 2019-09-14 ENCOUNTER — Other Ambulatory Visit: Payer: Self-pay

## 2019-09-14 DIAGNOSIS — M545 Low back pain: Secondary | ICD-10-CM | POA: Diagnosis not present

## 2019-09-14 DIAGNOSIS — M6281 Muscle weakness (generalized): Secondary | ICD-10-CM | POA: Diagnosis not present

## 2019-09-14 DIAGNOSIS — M25562 Pain in left knee: Secondary | ICD-10-CM | POA: Diagnosis not present

## 2019-09-14 DIAGNOSIS — R2681 Unsteadiness on feet: Secondary | ICD-10-CM | POA: Diagnosis not present

## 2019-09-16 DIAGNOSIS — M6281 Muscle weakness (generalized): Secondary | ICD-10-CM | POA: Diagnosis not present

## 2019-09-16 DIAGNOSIS — M545 Low back pain: Secondary | ICD-10-CM | POA: Diagnosis not present

## 2019-09-16 DIAGNOSIS — R2681 Unsteadiness on feet: Secondary | ICD-10-CM | POA: Diagnosis not present

## 2019-09-16 DIAGNOSIS — M25562 Pain in left knee: Secondary | ICD-10-CM | POA: Diagnosis not present

## 2019-09-19 DIAGNOSIS — M25562 Pain in left knee: Secondary | ICD-10-CM | POA: Diagnosis not present

## 2019-09-19 DIAGNOSIS — M545 Low back pain: Secondary | ICD-10-CM | POA: Diagnosis not present

## 2019-09-19 DIAGNOSIS — R2681 Unsteadiness on feet: Secondary | ICD-10-CM | POA: Diagnosis not present

## 2019-09-19 DIAGNOSIS — M6281 Muscle weakness (generalized): Secondary | ICD-10-CM | POA: Diagnosis not present

## 2019-09-21 ENCOUNTER — Telehealth: Payer: Self-pay | Admitting: Physical Medicine and Rehabilitation

## 2019-09-21 NOTE — Telephone Encounter (Signed)
Patient reports good relief from injection, but she also reports a fall since last injection. She fell sideways on her left leg. She has been to PT, and she thinks that is what is causing her back pain to increase, but it does help her leg. Still ok to repeat with new trauma?

## 2019-09-21 NOTE — Telephone Encounter (Signed)
Ok but would eval of course, if significant trauma see Ortho, I think it was [facility]

## 2019-09-21 NOTE — Telephone Encounter (Signed)
If that injection was not beneficial she would benefit from surgical referral to see if they could do just decompression at that level.  Could repeat MRI to see if changes. If injection helped but not lasting or lasting but not enough help, then repeat. Injection looked perfect.

## 2019-09-22 NOTE — Telephone Encounter (Signed)
Schedule for repeat injection with eval on 12/23 at 1300.

## 2019-10-12 ENCOUNTER — Ambulatory Visit (INDEPENDENT_AMBULATORY_CARE_PROVIDER_SITE_OTHER): Payer: Medicare Other | Admitting: Physical Medicine and Rehabilitation

## 2019-10-12 ENCOUNTER — Other Ambulatory Visit: Payer: Self-pay

## 2019-10-12 ENCOUNTER — Ambulatory Visit: Payer: Self-pay

## 2019-10-12 ENCOUNTER — Encounter: Payer: Self-pay | Admitting: Physical Medicine and Rehabilitation

## 2019-10-12 ENCOUNTER — Other Ambulatory Visit: Payer: Self-pay | Admitting: Family Medicine

## 2019-10-12 VITALS — BP 104/57 | HR 69

## 2019-10-12 DIAGNOSIS — G8929 Other chronic pain: Secondary | ICD-10-CM

## 2019-10-12 DIAGNOSIS — M545 Low back pain, unspecified: Secondary | ICD-10-CM

## 2019-10-12 DIAGNOSIS — M5416 Radiculopathy, lumbar region: Secondary | ICD-10-CM | POA: Diagnosis not present

## 2019-10-12 DIAGNOSIS — M7138 Other bursal cyst, other site: Secondary | ICD-10-CM

## 2019-10-12 DIAGNOSIS — M48061 Spinal stenosis, lumbar region without neurogenic claudication: Secondary | ICD-10-CM

## 2019-10-12 MED ORDER — METHYLPREDNISOLONE ACETATE 80 MG/ML IJ SUSP
40.0000 mg | Freq: Once | INTRAMUSCULAR | Status: AC
  Administered 2019-10-12: 14:00:00 40 mg

## 2019-10-12 NOTE — Procedures (Signed)
Lumbosacral Transforaminal Epidural Steroid Injection - Sub-Pedicular Approach with Fluoroscopic Guidance  Patient: Tracey Armstrong      Date of Birth: Jan 05, 1935 MRN: [id number] PCP: Martinique, [name], MD      Visit Date: 08/24/2019   Universal Protocol:    Date/Time: 08/24/2019  Consent Given By: the patient  Position: PRONE  Additional Comments: Vital signs were monitored before and after the procedure. Patient was prepped and draped in the usual sterile fashion. The correct patient, procedure, and site was verified.   Injection Procedure Details:  Procedure Site One Meds Administered:  Meds ordered this encounter  Medications  . betamethasone acetate-betamethasone sodium phosphate (CELESTONE) injection 12 mg    Laterality: Left  Location/Site:  L4-L5  Needle size: 22 G  Needle type: Spinal  Needle Placement: Transforaminal  Findings:    -Comments: Excellent flow of contrast along the nerve and into the epidural space.  Procedure Details: After squaring off the end-plates to get a true AP view, the C-arm was positioned so that an oblique view of the foramen as noted above was visualized. The target area is just inferior to the "nose of the scotty dog" or sub pedicular. The soft tissues overlying this structure were infiltrated with 2-3 ml. of 1% Lidocaine without Epinephrine.  The spinal needle was inserted toward the target using a "trajectory" view along the fluoroscope beam.  Under AP and lateral visualization, the needle was advanced so it did not puncture dura and was located close the 6 O'Clock position of the pedical in AP tracterory. Biplanar projections were used to confirm position. Aspiration was confirmed to be negative for CSF and/or blood. A 1-2 ml. volume of Isovue-250 was injected and flow of contrast was noted at each level. Radiographs were obtained for documentation purposes.   After attaining the desired flow of contrast documented above, a 0.5 to 1.0  ml test dose of 0.25% Marcaine was injected into each respective transforaminal space.  The patient was observed for 90 seconds post injection.  After no sensory deficits were reported, and normal lower extremity motor function was noted,   the above injectate was administered so that equal amounts of the injectate were placed at each foramen (level) into the transforaminal epidural space.   Additional Comments:  The patient tolerated the procedure well Dressing: 2 x 2 sterile gauze and Band-Aid    Post-procedure details: Patient was observed during the procedure. Post-procedure instructions were reviewed.  Patient left the clinic in stable condition.

## 2019-10-12 NOTE — Progress Notes (Signed)
Left L5, Left sided lower back and leg pain. Pain down front of left leg down to foot. Posterior knee and lower leg pain. Thinks that last injection did help. Wants to see if injection can be "moved over to the left a little bit."  Numeric Pain Rating Scale and Functional Assessment Average Pain 8   In the last MONTH (on 0-10 scale) has pain interfered with the following?  1. General activity like being  able to carry out your everyday physical activities such as walking, climbing stairs, carrying groceries, or moving a chair?  Rating(7)   +Driver, -BT, -Dye Allergies.

## 2019-10-12 NOTE — Progress Notes (Signed)
Tracey Armstrong - 84 y.o. female MRN [id number]  Date of birth: February 05, 1935  Office Visit Note: Visit Date: 08/24/2019 PCP: Martinique, [name], MD Referred by: Martinique, [name], MD  Subjective: Chief Complaint  Patient presents with  . Lower Back - Pain  . Left Hip - Pain  . Left Leg - Pain   HPI:  Tracey Armstrong is a 84 y.o. female who comes in today For planned left L4 transforaminal epidural steroid injection.  Patient having left radicular leg pain with facet joint cyst in the foramen at L4-5 with some stenosis at this level as well.  Prior epidural injection from an interlaminar approach at L5-S1 helped to some degree but not very much.  Facet joint block was really ineffective and that was done many months ago.  We have not seen her recently.  She is followed by Dr. Jean Rosenthal and his assistant Benita Stabile, PA-C.  She has had no new trauma since have seen her.  We discussed in the past a transforaminal approach to this and she does want to try that today.  Alternatives would be surgical evaluation for removal of this cyst and foraminotomy.  She is not really interested too much in surgery although that might be a minimal surgery to help.  She will continue to follow-up with Dr. Ninfa Linden.  ROS Otherwise per HPI.  Assessment & Plan: Visit Diagnoses:  1. Lumbar radiculopathy     Plan: No additional findings.   Meds & Orders:  Meds ordered this encounter  Medications  . betamethasone acetate-betamethasone sodium phosphate (CELESTONE) injection 12 mg    Orders Placed This Encounter  Procedures  . XR C-ARM NO REPORT  . Epidural Steroid injection    Follow-up: Return for visit to requesting physician as needed.   Procedures: No procedures performed  Lumbosacral Transforaminal Epidural Steroid Injection - Sub-Pedicular Approach with Fluoroscopic Guidance  Patient: Tracey Armstrong      Date of Birth: April 26, 1935 MRN: [id number] PCP: Martinique, [name], MD      Visit Date:  08/24/2019   Universal Protocol:    Date/Time: 08/24/2019  Consent Given By: the patient  Position: PRONE  Additional Comments: Vital signs were monitored before and after the procedure. Patient was prepped and draped in the usual sterile fashion. The correct patient, procedure, and site was verified.   Injection Procedure Details:  Procedure Site One Meds Administered:  Meds ordered this encounter  Medications  . betamethasone acetate-betamethasone sodium phosphate (CELESTONE) injection 12 mg    Laterality: Left  Location/Site:  L4-L5  Needle size: 22 G  Needle type: Spinal  Needle Placement: Transforaminal  Findings:    -Comments: Excellent flow of contrast along the nerve and into the epidural space.  Procedure Details: After squaring off the end-plates to get a true AP view, the C-arm was positioned so that an oblique view of the foramen as noted above was visualized. The target area is just inferior to the "nose of the scotty dog" or sub pedicular. The soft tissues overlying this structure were infiltrated with 2-3 ml. of 1% Lidocaine without Epinephrine.  The spinal needle was inserted toward the target using a "trajectory" view along the fluoroscope beam.  Under AP and lateral visualization, the needle was advanced so it did not puncture dura and was located close the 6 O'Clock position of the pedical in AP tracterory. Biplanar projections were used to confirm position. Aspiration was confirmed to be negative for CSF and/or blood. A 1-2 ml. volume  of Isovue-250 was injected and flow of contrast was noted at each level. Radiographs were obtained for documentation purposes.   After attaining the desired flow of contrast documented above, a 0.5 to 1.0 ml test dose of 0.25% Marcaine was injected into each respective transforaminal space.  The patient was observed for 90 seconds post injection.  After no sensory deficits were reported, and normal lower extremity motor  function was noted,   the above injectate was administered so that equal amounts of the injectate were placed at each foramen (level) into the transforaminal epidural space.   Additional Comments:  The patient tolerated the procedure well Dressing: 2 x 2 sterile gauze and Band-Aid    Post-procedure details: Patient was observed during the procedure. Post-procedure instructions were reviewed.  Patient left the clinic in stable condition.     Clinical History: MRI LUMBAR SPINE WITHOUT CONTRAST  TECHNIQUE: Multiplanar, multisequence MR imaging of the lumbar spine was performed. No intravenous contrast was administered.  COMPARISON:  Lumbar MRI 06/20/2016  FINDINGS: Segmentation:  Normal  Alignment: Mild retrolisthesis T12-L1, L1-2, L2-3. 5 mm anterolisthesis L4-5 has progressed in the interval.  Vertebrae:  Normal bone marrow.  Negative for fracture or mass.  Conus medullaris and cauda equina: Conus extends to the L2 level. Conus and cauda equina appear normal.  Paraspinal and other soft tissues: Negative for paraspinous mass or fluid collection. Retroperitoneal structures negative.  Disc levels:  T12-L1: Mild disc bulging without stenosis  L1-2: Small central disc protrusion unchanged. Mild disc and facet degeneration without significant stenosis  L2-3: Mild disc and moderate facet degeneration. No significant stenosis.  L3-4: Disc degeneration with diffuse disc bulging. Moderate facet hypertrophy. Mild spinal stenosis and mild subarticular stenosis bilaterally  L4-5: 4 mm anterolisthesis has progressed in the interval. Advanced facet degeneration. 4 mm synovial cyst on the left projecting into the subarticular zone and causing subarticular stenosis on the left. Moderate spinal stenosis. Moderate subarticular stenosis on the left could affect the left L5 nerve root.  L5-S1: Mild disc degeneration. Moderate facet degeneration. Small left-sided  synovial cyst has improved in the interval. No significant stenosis.  IMPRESSION: Small central disc protrusion L1-2 unchanged  Mild spinal stenosis and mild subarticular stenosis bilaterally at L3-4  Progressive anterolisthesis L4-5. 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  Improvement in left-sided synovial cyst L5-S1.   Electronically Signed   By: Franchot Gallo M.D.   On: 09/09/2018 10:49     Objective:  VS:  HT:    WT:   BMI:     BP:(!) 146/74  HR:82bpm  TEMP: ( )  RESP:  Physical Exam  Ortho Exam Imaging: No results found.

## 2019-10-12 NOTE — Progress Notes (Signed)
Tracey Armstrong - 84 y.o. female MRN [id number]  Date of birth: June 09, 1935  Office Visit Note: Visit Date: 10/12/2019 PCP: Martinique, [name], MD Referred by: Martinique, [name], MD  Subjective: Chief Complaint  Patient presents with  . Lower Back - Pain  . Left Leg - Pain   HPI: Tracey Armstrong is a 84 y.o. female who comes in today at the request of Dr. Jean Rosenthal for planned Left L5-S1 Lumbar epidural steroid injection with fluoroscopic guidance.  The patient has failed conservative care including home exercise, medications, time and activity modification.  This injection will be diagnostic and hopefully therapeutic.  Please see requesting physician notes for further details and justification.   ROS Otherwise per HPI.  Assessment & Plan: Visit Diagnoses:  1. Lumbar radiculopathy   2. Bilateral stenosis of lateral recess of lumbar spine   3. Synovial cyst of lumbar facet joint     Plan: No additional findings.   Meds & Orders:  Meds ordered this encounter  Medications  . methylPREDNISolone acetate (DEPO-MEDROL) injection 40 mg    Orders Placed This Encounter  Procedures  . XR C-ARM NO REPORT  . Epidural Steroid injection    Follow-up: Return if symptoms worsen or fail to improve.   Procedures: No procedures performed  Lumbosacral Transforaminal Epidural Steroid Injection - Sub-Pedicular Approach with Fluoroscopic Guidance  Patient: Tracey Armstrong      Date of Birth: Apr 20, 1935 MRN: [id number] PCP: Martinique, [name], MD      Visit Date: 10/12/2019   Universal Protocol:    Date/Time: 10/12/2019  Consent Given By: the patient  Position: PRONE  Additional Comments: Vital signs were monitored before and after the procedure. Patient was prepped and draped in the usual sterile fashion. The correct patient, procedure, and site was verified.   Injection Procedure Details:  Procedure Site One Meds Administered:  Meds ordered this encounter  Medications  .  methylPREDNISolone acetate (DEPO-MEDROL) injection 40 mg    Laterality: Left  Location/Site:  L5-S1  Needle size: 22 G  Needle type: Spinal  Needle Placement: Transforaminal  Findings:    -Comments: Excellent flow of contrast along the nerve, nerve root and into the epidural space.  Procedure Details: After squaring off the end-plates to get a true AP view, the C-arm was positioned so that an oblique view of the foramen as noted above was visualized. The target area is just inferior to the "nose of the scotty dog" or sub pedicular. The soft tissues overlying this structure were infiltrated with 2-3 ml. of 1% Lidocaine without Epinephrine.  The spinal needle was inserted toward the target using a "trajectory" view along the fluoroscope beam.  Under AP and lateral visualization, the needle was advanced so it did not puncture dura and was located close the 6 O'Clock position of the pedical in AP tracterory. Biplanar projections were used to confirm position. Aspiration was confirmed to be negative for CSF and/or blood. A 1-2 ml. volume of Isovue-250 was injected and flow of contrast was noted at each level. Radiographs were obtained for documentation purposes.   After attaining the desired flow of contrast documented above, a 0.5 to 1.0 ml test dose of 0.25% Marcaine was injected into each respective transforaminal space.  The patient was observed for 90 seconds post injection.  After no sensory deficits were reported, and normal lower extremity motor function was noted,   the above injectate was administered so that equal amounts of the injectate were placed at each foramen (  level) into the transforaminal epidural space.   Additional Comments:  The patient tolerated the procedure well Dressing: 2 x 2 sterile gauze and Band-Aid    Post-procedure details: Patient was observed during the procedure. Post-procedure instructions were reviewed.  Patient left the clinic in stable  condition.     Clinical History: MRI LUMBAR SPINE WITHOUT CONTRAST  TECHNIQUE: Multiplanar, multisequence MR imaging of the lumbar spine was performed. No intravenous contrast was administered.  COMPARISON:  Lumbar MRI 06/20/2016  FINDINGS: Segmentation:  Normal  Alignment: Mild retrolisthesis T12-L1, L1-2, L2-3. 5 mm anterolisthesis L4-5 has progressed in the interval.  Vertebrae:  Normal bone marrow.  Negative for fracture or mass.  Conus medullaris and cauda equina: Conus extends to the L2 level. Conus and cauda equina appear normal.  Paraspinal and other soft tissues: Negative for paraspinous mass or fluid collection. Retroperitoneal structures negative.  Disc levels:  T12-L1: Mild disc bulging without stenosis  L1-2: Small central disc protrusion unchanged. Mild disc and facet degeneration without significant stenosis  L2-3: Mild disc and moderate facet degeneration. No significant stenosis.  L3-4: Disc degeneration with diffuse disc bulging. Moderate facet hypertrophy. Mild spinal stenosis and mild subarticular stenosis bilaterally  L4-5: 4 mm anterolisthesis has progressed in the interval. Advanced facet degeneration. 4 mm synovial cyst on the left projecting into the subarticular zone and causing subarticular stenosis on the left. Moderate spinal stenosis. Moderate subarticular stenosis on the left could affect the left L5 nerve root.  L5-S1: Mild disc degeneration. Moderate facet degeneration. Small left-sided synovial cyst has improved in the interval. No significant stenosis.  IMPRESSION: Small central disc protrusion L1-2 unchanged  Mild spinal stenosis and mild subarticular stenosis bilaterally at L3-4  Progressive anterolisthesis L4-5. 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  Improvement in left-sided synovial cyst L5-S1.   Electronically Signed   By:  Franchot Gallo M.D.   On: 09/09/2018 10:49   She reports that she has never smoked. She has never used smokeless tobacco. No results for input(s): HGBA1C, LABURIC in the last 8760 hours.  Objective:  VS:  HT:    WT:   BMI:     BP:(!) 104/57  HR:69bpm  TEMP: ( )  RESP:  Physical Exam Constitutional:      General: She is not in acute distress.    Appearance: Normal appearance. She is not ill-appearing.  HENT:     Head: Normocephalic and atraumatic.     Right Ear: External ear normal.     Left Ear: External ear normal.  Eyes:     Extraocular Movements: Extraocular movements intact.  Cardiovascular:     Rate and Rhythm: Normal rate.     Pulses: Normal pulses.  Musculoskeletal:     Right lower leg: No edema.     Left lower leg: No edema.     Comments: Patient has good distal strength with no pain over the greater trochanters.  No clonus or focal weakness.  Skin:    Findings: No erythema, lesion or rash.  Neurological:     General: No focal deficit present.     Mental Status: She is alert and oriented to person, place, and time.     Sensory: No sensory deficit.     Motor: No weakness or abnormal muscle tone.     Coordination: Coordination normal.  Psychiatric:        Mood and Affect: Mood normal.  Behavior: Behavior normal.     Ortho Exam Imaging: DG Lumbar Spine Complete  Result Date: 04/04/2020 CLINICAL DATA:  Low back pain while bending, initial encounter EXAM: LUMBAR SPINE - COMPLETE 4+ VIEW COMPARISON:  07/28/2018 FINDINGS: Five lumbar type vertebral bodies are well visualized. L1 compression deformity is noted which is new from the prior exam but appears chronic in nature. Correlation to point tenderness is recommended. Mild degenerative anterolisthesis of L4 on L5 is noted stable from prior exam. Mild facet hypertrophic changes are noted. Ingested tablets are noted in the right abdomen. No soft tissue abnormality is seen. IMPRESSION: L1 compression deformity which  appears chronic but new from a prior exam of 2019. Correlation to point tenderness is recommended. Degenerative change with anterolisthesis of L4 on L5. Electronically Signed   By: Inez Catalina M.D.   On: 04/04/2020 10:23   CT Lumbar Spine Wo Contrast  Result Date: 04/04/2020 CLINICAL DATA:  Low back pain for 3 days. EXAM: CT LUMBAR SPINE WITHOUT CONTRAST TECHNIQUE: Multidetector CT imaging of the lumbar spine was performed without intravenous contrast administration. Multiplanar CT image reconstructions were also generated. COMPARISON:  Lumbar spine radiographs 04/04/2020 and MRI 09/09/2018 FINDINGS: Segmentation: 5 lumbar type vertebrae. Alignment: Mild thoracolumbar levoscoliosis. Grade 1 anterolisthesis of L4 on L5, unchanged from the prior MRI. Vertebrae: L1 superior endplate compression fracture with 30% vertebral body height loss and 4 mm retropulsion of the superior endplate. A fracture line remains visible although there is surrounding sclerosis. Hemangioma in the L4 vertebral body. Paraspinal and other soft tissues: Aortic atherosclerosis without aneurysm. Disc levels: Disc bulging and posterior element hypertrophy result in chronic mild-to-moderate spinal stenosis at L3-4 and moderate to severe spinal stenosis at L4-5. No significant spinal stenosis related to mild L1 retropulsion. IMPRESSION: 1. Possibly subacute L1 compression fracture with 30% height loss and 4 mm retropulsion. No associated spinal stenosis. 2. Chronic mild-to-moderate spinal stenosis at L3-4 and moderate to severe spinal stenosis at L4-5. 3. Aortic Atherosclerosis (ICD10-I70.0). Electronically Signed   By: Logan Bores M.D.   On: 04/04/2020 17:39   DG HIP UNILAT WITH PELVIS 2-3 VIEWS LEFT  Result Date: 04/04/2020 CLINICAL DATA:  Left hip pain following bending, initial encounter EXAM: DG HIP (WITH OR WITHOUT PELVIS) 2V LEFT COMPARISON:  None. FINDINGS: No acute fracture or dislocation is seen. No significant degenerative  changes are noted. No soft tissue abnormality is seen. IMPRESSION: No acute abnormality noted. Electronically Signed   By: Inez Catalina M.D.   On: 04/04/2020 10:24   DG HIP UNILAT WITH PELVIS 2-3 VIEWS RIGHT  Result Date: 04/04/2020 CLINICAL DATA:  Right hip pain following bending, initial encounter EXAM: DG HIP (WITH OR WITHOUT PELVIS) 3V RIGHT COMPARISON:  None. FINDINGS: Pelvic ring is intact. No acute fracture or dislocation is noted. No significant degenerative changes are seen. No soft tissue abnormality is noted. IMPRESSION: No acute abnormality noted. Electronically Signed   By: Inez Catalina M.D.   On: 04/04/2020 10:26    Past Medical/Family/Surgical/Social History: Medications & Allergies reviewed per EMR, new medications updated. Patient Active Problem List   Diagnosis Date Noted  . Synovial cyst of lumbar facet joint 04/05/2020  . Bilateral stenosis of lateral recess of lumbar spine 04/05/2020  . Closed wedge compression fracture of L1 vertebra ([place]) 04/04/2020  . Ambulatory dysfunction 04/04/2020  . Intractable low back pain 04/04/2020  . L1 vertebral fracture ([place]) 04/04/2020  . Urine, incontinence, stress female 02/07/2019  . Knee osteoarthritis 02/01/2018  . Iron deficiency  anemia 10/28/2017  . Rhinitis, allergic 07/27/2017  . GERD (gastroesophageal reflux disease) 06/16/2017  . Chronic pain disorder 06/16/2017  . Vitamin D deficiency 05/25/2017  . Hyperlipidemia 05/25/2017  . 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 Pain 02/26/2017  . Chronic low back pain 12/02/2016  . Abdominal pain 03/26/2016  . 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 Peptic esophagitis 03/26/2016  . Neuralgia neuritis, sciatic nerve 03/26/2016  . 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[place]) 06/20/2015  . Chronic insomnia 03/30/2015  . Chronic migraine 01/10/2015  . Depression 04/20/2014  . Generalized anxiety disorder 04/20/2014  . Chronic back pain 04/20/2014  . Chronic daily headache 04/20/2014  . Essential hypertension  04/20/2014  . Migraine headache 04/19/2014   Past Medical History:  Diagnosis Date  . Anxiety   . Arthritis   . Chronic insomnia 03/30/2015  . Chronic low back pain 12/02/2016  . Depression   . GERD (gastroesophageal reflux disease)   . Hiatal hernia   . High cholesterol   . Hypertelorism   . Hypertension   . Meningioma ([place])   . Migraine    Family History  Problem Relation Age of Onset  . Migraines Father   . Stroke Father   . Heart Problems Mother   . Epilepsy Brother    Past Surgical History:  Procedure Laterality Date  . STOMACH SURGERY     Social History   Occupational History  . Occupation: Retired  Tobacco Use  . Smoking status: Never Smoker  . Smokeless tobacco: Never Used  Vaping Use  . Vaping Use: Never used  Substance and Sexual Activity  . Alcohol use: No  . Drug use: No  . Sexual activity: Not Currently

## 2019-10-24 DIAGNOSIS — Z1159 Encounter for screening for other viral diseases: Secondary | ICD-10-CM | POA: Diagnosis not present

## 2019-10-24 DIAGNOSIS — Z20828 Contact with and (suspected) exposure to other viral communicable diseases: Secondary | ICD-10-CM | POA: Diagnosis not present

## 2019-10-25 DIAGNOSIS — M6281 Muscle weakness (generalized): Secondary | ICD-10-CM | POA: Diagnosis not present

## 2019-10-25 DIAGNOSIS — R2681 Unsteadiness on feet: Secondary | ICD-10-CM | POA: Diagnosis not present

## 2019-10-25 DIAGNOSIS — M25562 Pain in left knee: Secondary | ICD-10-CM | POA: Diagnosis not present

## 2019-10-25 DIAGNOSIS — M545 Low back pain: Secondary | ICD-10-CM | POA: Diagnosis not present

## 2019-10-27 DIAGNOSIS — Z1159 Encounter for screening for other viral diseases: Secondary | ICD-10-CM | POA: Diagnosis not present

## 2019-10-27 DIAGNOSIS — Z20828 Contact with and (suspected) exposure to other viral communicable diseases: Secondary | ICD-10-CM | POA: Diagnosis not present

## 2019-10-27 DIAGNOSIS — M545 Low back pain: Secondary | ICD-10-CM | POA: Diagnosis not present

## 2019-10-27 DIAGNOSIS — R2681 Unsteadiness on feet: Secondary | ICD-10-CM | POA: Diagnosis not present

## 2019-10-27 DIAGNOSIS — M6281 Muscle weakness (generalized): Secondary | ICD-10-CM | POA: Diagnosis not present

## 2019-10-27 DIAGNOSIS — M25562 Pain in left knee: Secondary | ICD-10-CM | POA: Diagnosis not present

## 2019-10-31 DIAGNOSIS — M6281 Muscle weakness (generalized): Secondary | ICD-10-CM | POA: Diagnosis not present

## 2019-10-31 DIAGNOSIS — Z1159 Encounter for screening for other viral diseases: Secondary | ICD-10-CM | POA: Diagnosis not present

## 2019-10-31 DIAGNOSIS — R2681 Unsteadiness on feet: Secondary | ICD-10-CM | POA: Diagnosis not present

## 2019-10-31 DIAGNOSIS — M25562 Pain in left knee: Secondary | ICD-10-CM | POA: Diagnosis not present

## 2019-10-31 DIAGNOSIS — Z20828 Contact with and (suspected) exposure to other viral communicable diseases: Secondary | ICD-10-CM | POA: Diagnosis not present

## 2019-10-31 DIAGNOSIS — M545 Low back pain: Secondary | ICD-10-CM | POA: Diagnosis not present

## 2019-11-02 ENCOUNTER — Telehealth: Payer: Self-pay

## 2019-11-02 NOTE — Telephone Encounter (Signed)
Copied from [place] [id number]. Topic: General - Other >> Nov 02, 2019  1:01 PM Rayann Heman wrote: Reason for CRM: pt called and stated that she would like a call back regarding covid vaccine. Pt states that they are coming January 21st and the registration had questions she did not understand so she would like to talk to a nurse. Please advise

## 2019-11-02 NOTE — Telephone Encounter (Signed)
Tried calling pt, line was busy; will try again.

## 2019-11-02 NOTE — Telephone Encounter (Signed)
I called and spoke with pt. They are wanting to know if it is okay for her to take the covid vaccine since she is on Gabapentin, and she also had steroid injections in December & November. She had an infected tooth, but is going to see her dentist tomorrow.

## 2019-11-03 DIAGNOSIS — Z1159 Encounter for screening for other viral diseases: Secondary | ICD-10-CM | POA: Diagnosis not present

## 2019-11-03 DIAGNOSIS — M6281 Muscle weakness (generalized): Secondary | ICD-10-CM | POA: Diagnosis not present

## 2019-11-03 DIAGNOSIS — Z20828 Contact with and (suspected) exposure to other viral communicable diseases: Secondary | ICD-10-CM | POA: Diagnosis not present

## 2019-11-03 DIAGNOSIS — M25562 Pain in left knee: Secondary | ICD-10-CM | POA: Diagnosis not present

## 2019-11-03 DIAGNOSIS — R2681 Unsteadiness on feet: Secondary | ICD-10-CM | POA: Diagnosis not present

## 2019-11-03 DIAGNOSIS — M545 Low back pain: Secondary | ICD-10-CM | POA: Diagnosis not present

## 2019-11-04 NOTE — Telephone Encounter (Signed)
I left pt a voicemail to return my call, okay for triage nurse to discuss below info with pt.

## 2019-11-04 NOTE — Telephone Encounter (Signed)
As far a I know Gabapentin is not a contraindication to get COVID 19 vaccine. Pts usually receive a form to complete before getting vaccine that inquires about previous allergies and reactions among some other questions. I will recommend having "tooth infection" treated before getting vaccine.  Thanks, BJ

## 2019-11-07 DIAGNOSIS — Z20828 Contact with and (suspected) exposure to other viral communicable diseases: Secondary | ICD-10-CM | POA: Diagnosis not present

## 2019-11-07 DIAGNOSIS — M6281 Muscle weakness (generalized): Secondary | ICD-10-CM | POA: Diagnosis not present

## 2019-11-07 DIAGNOSIS — M25562 Pain in left knee: Secondary | ICD-10-CM | POA: Diagnosis not present

## 2019-11-07 DIAGNOSIS — Z1159 Encounter for screening for other viral diseases: Secondary | ICD-10-CM | POA: Diagnosis not present

## 2019-11-07 DIAGNOSIS — R2681 Unsteadiness on feet: Secondary | ICD-10-CM | POA: Diagnosis not present

## 2019-11-07 DIAGNOSIS — M545 Low back pain: Secondary | ICD-10-CM | POA: Diagnosis not present

## 2019-11-07 NOTE — Telephone Encounter (Signed)
I called and spoke with pt. We went over the information below. She is scheduled to get vaccine 1/21 from 9a-10a. They will keep her for 15 minutes after to make sure there are no adverse effects. Pt is aware to call back if she has any questions.

## 2019-11-10 DIAGNOSIS — Z23 Encounter for immunization: Secondary | ICD-10-CM | POA: Diagnosis not present

## 2019-11-10 DIAGNOSIS — R2681 Unsteadiness on feet: Secondary | ICD-10-CM | POA: Diagnosis not present

## 2019-11-10 DIAGNOSIS — M25562 Pain in left knee: Secondary | ICD-10-CM | POA: Diagnosis not present

## 2019-11-10 DIAGNOSIS — M6281 Muscle weakness (generalized): Secondary | ICD-10-CM | POA: Diagnosis not present

## 2019-11-10 DIAGNOSIS — M545 Low back pain: Secondary | ICD-10-CM | POA: Diagnosis not present

## 2019-11-14 ENCOUNTER — Other Ambulatory Visit: Payer: Self-pay

## 2019-11-14 DIAGNOSIS — M6281 Muscle weakness (generalized): Secondary | ICD-10-CM | POA: Diagnosis not present

## 2019-11-14 DIAGNOSIS — R2681 Unsteadiness on feet: Secondary | ICD-10-CM | POA: Diagnosis not present

## 2019-11-14 DIAGNOSIS — G8929 Other chronic pain: Secondary | ICD-10-CM

## 2019-11-14 DIAGNOSIS — M25562 Pain in left knee: Secondary | ICD-10-CM | POA: Diagnosis not present

## 2019-11-14 DIAGNOSIS — Z1159 Encounter for screening for other viral diseases: Secondary | ICD-10-CM | POA: Diagnosis not present

## 2019-11-14 DIAGNOSIS — Z20828 Contact with and (suspected) exposure to other viral communicable diseases: Secondary | ICD-10-CM | POA: Diagnosis not present

## 2019-11-14 DIAGNOSIS — M545 Low back pain: Secondary | ICD-10-CM | POA: Diagnosis not present

## 2019-11-14 MED ORDER — TIZANIDINE HCL 4 MG PO TABS: 4.0000 mg | ORAL_TABLET | Freq: Two times a day (BID) | ORAL | 2 refills | Status: DC | PRN

## 2019-11-15 ENCOUNTER — Other Ambulatory Visit: Payer: Self-pay | Admitting: Physician Assistant

## 2019-11-15 ENCOUNTER — Telehealth: Payer: Self-pay | Admitting: Neurology

## 2019-11-15 NOTE — Telephone Encounter (Signed)
Patient called regarding her PT and that she does not feel that she is getting any better. She said that it feels better during the therapy but afterwards it does not. She said that the pain runs from her hip to her knee like nerve pain. Please Advise. Thank you

## 2019-11-15 NOTE — Telephone Encounter (Signed)
Please advise 

## 2019-11-15 NOTE — Telephone Encounter (Signed)
Pt called and advised to follow up with othopedic doctor about her hip and back pain.

## 2019-11-15 NOTE — Telephone Encounter (Signed)
Her orthopedic doctor is already treating her with medication for nerve pain, the Lyrica. Pls have her call him regarding this, they can increase dose as tolerated. Thanks

## 2019-11-16 ENCOUNTER — Telehealth: Payer: Self-pay | Admitting: Family Medicine

## 2019-11-16 ENCOUNTER — Other Ambulatory Visit: Payer: Self-pay | Admitting: Family Medicine

## 2019-11-16 DIAGNOSIS — F411 Generalized anxiety disorder: Secondary | ICD-10-CM

## 2019-11-16 NOTE — Telephone Encounter (Signed)
Pt is needing to get her medication Alprazolam refilled. She has been out of this medication for 2 days now and was advised to take it before her surgery on 11/28/19  Pharmacy ([facility], [place], [place] [id number])

## 2019-11-17 DIAGNOSIS — R2681 Unsteadiness on feet: Secondary | ICD-10-CM | POA: Diagnosis not present

## 2019-11-17 DIAGNOSIS — M6281 Muscle weakness (generalized): Secondary | ICD-10-CM | POA: Diagnosis not present

## 2019-11-17 DIAGNOSIS — Z1159 Encounter for screening for other viral diseases: Secondary | ICD-10-CM | POA: Diagnosis not present

## 2019-11-17 DIAGNOSIS — M545 Low back pain: Secondary | ICD-10-CM | POA: Diagnosis not present

## 2019-11-17 DIAGNOSIS — Z20828 Contact with and (suspected) exposure to other viral communicable diseases: Secondary | ICD-10-CM | POA: Diagnosis not present

## 2019-11-17 DIAGNOSIS — M25562 Pain in left knee: Secondary | ICD-10-CM | POA: Diagnosis not present

## 2019-11-17 NOTE — Telephone Encounter (Signed)
   ALPRAZolam Duanne Moron) 1 MG tablet   [id number] - [place], [address] AT [address] Phone:  [phone]  Fax:  [phone]      She is out of this Rx and she has a bad tooth and can't sleep.

## 2019-11-18 MED ORDER — ALPRAZOLAM 1 MG PO TABS: ORAL_TABLET | ORAL | 2 refills | Status: DC

## 2019-11-21 DIAGNOSIS — M25562 Pain in left knee: Secondary | ICD-10-CM | POA: Diagnosis not present

## 2019-11-21 DIAGNOSIS — Z20828 Contact with and (suspected) exposure to other viral communicable diseases: Secondary | ICD-10-CM | POA: Diagnosis not present

## 2019-11-21 DIAGNOSIS — Z1159 Encounter for screening for other viral diseases: Secondary | ICD-10-CM | POA: Diagnosis not present

## 2019-11-21 DIAGNOSIS — M545 Low back pain: Secondary | ICD-10-CM | POA: Diagnosis not present

## 2019-11-21 DIAGNOSIS — M6281 Muscle weakness (generalized): Secondary | ICD-10-CM | POA: Diagnosis not present

## 2019-11-21 DIAGNOSIS — R2681 Unsteadiness on feet: Secondary | ICD-10-CM | POA: Diagnosis not present

## 2019-11-24 DIAGNOSIS — M545 Low back pain: Secondary | ICD-10-CM | POA: Diagnosis not present

## 2019-11-24 DIAGNOSIS — M6281 Muscle weakness (generalized): Secondary | ICD-10-CM | POA: Diagnosis not present

## 2019-11-24 DIAGNOSIS — M25562 Pain in left knee: Secondary | ICD-10-CM | POA: Diagnosis not present

## 2019-11-24 DIAGNOSIS — R2681 Unsteadiness on feet: Secondary | ICD-10-CM | POA: Diagnosis not present

## 2019-11-28 DIAGNOSIS — Z20828 Contact with and (suspected) exposure to other viral communicable diseases: Secondary | ICD-10-CM | POA: Diagnosis not present

## 2019-11-28 DIAGNOSIS — Z1159 Encounter for screening for other viral diseases: Secondary | ICD-10-CM | POA: Diagnosis not present

## 2019-12-05 DIAGNOSIS — Z20828 Contact with and (suspected) exposure to other viral communicable diseases: Secondary | ICD-10-CM | POA: Diagnosis not present

## 2019-12-05 DIAGNOSIS — Z1159 Encounter for screening for other viral diseases: Secondary | ICD-10-CM | POA: Diagnosis not present

## 2019-12-06 DIAGNOSIS — M6281 Muscle weakness (generalized): Secondary | ICD-10-CM | POA: Diagnosis not present

## 2019-12-06 DIAGNOSIS — M25562 Pain in left knee: Secondary | ICD-10-CM | POA: Diagnosis not present

## 2019-12-06 DIAGNOSIS — R2681 Unsteadiness on feet: Secondary | ICD-10-CM | POA: Diagnosis not present

## 2019-12-06 DIAGNOSIS — M545 Low back pain: Secondary | ICD-10-CM | POA: Diagnosis not present

## 2019-12-08 DIAGNOSIS — Z23 Encounter for immunization: Secondary | ICD-10-CM | POA: Diagnosis not present

## 2019-12-08 DIAGNOSIS — M6281 Muscle weakness (generalized): Secondary | ICD-10-CM | POA: Diagnosis not present

## 2019-12-08 DIAGNOSIS — R2681 Unsteadiness on feet: Secondary | ICD-10-CM | POA: Diagnosis not present

## 2019-12-08 DIAGNOSIS — M25562 Pain in left knee: Secondary | ICD-10-CM | POA: Diagnosis not present

## 2019-12-08 DIAGNOSIS — M545 Low back pain: Secondary | ICD-10-CM | POA: Diagnosis not present

## 2019-12-12 DIAGNOSIS — M25562 Pain in left knee: Secondary | ICD-10-CM | POA: Diagnosis not present

## 2019-12-12 DIAGNOSIS — Z20828 Contact with and (suspected) exposure to other viral communicable diseases: Secondary | ICD-10-CM | POA: Diagnosis not present

## 2019-12-12 DIAGNOSIS — R2681 Unsteadiness on feet: Secondary | ICD-10-CM | POA: Diagnosis not present

## 2019-12-12 DIAGNOSIS — M6281 Muscle weakness (generalized): Secondary | ICD-10-CM | POA: Diagnosis not present

## 2019-12-12 DIAGNOSIS — M545 Low back pain: Secondary | ICD-10-CM | POA: Diagnosis not present

## 2019-12-12 DIAGNOSIS — Z1159 Encounter for screening for other viral diseases: Secondary | ICD-10-CM | POA: Diagnosis not present

## 2019-12-14 ENCOUNTER — Encounter: Payer: Self-pay | Admitting: Physician Assistant

## 2019-12-14 ENCOUNTER — Other Ambulatory Visit: Payer: Self-pay

## 2019-12-14 ENCOUNTER — Telehealth: Payer: Medicare Other | Admitting: Neurology

## 2019-12-14 ENCOUNTER — Ambulatory Visit (INDEPENDENT_AMBULATORY_CARE_PROVIDER_SITE_OTHER): Payer: Medicare Other | Admitting: Physician Assistant

## 2019-12-14 DIAGNOSIS — M1712 Unilateral primary osteoarthritis, left knee: Secondary | ICD-10-CM | POA: Diagnosis not present

## 2019-12-14 DIAGNOSIS — M7062 Trochanteric bursitis, left hip: Secondary | ICD-10-CM

## 2019-12-14 MED ORDER — LIDOCAINE HCL 1 % IJ SOLN
3.0000 mL | INTRAMUSCULAR | Status: AC | PRN
  Administered 2019-12-14: 3 mL

## 2019-12-14 MED ORDER — METHYLPREDNISOLONE ACETATE 40 MG/ML IJ SUSP
40.0000 mg | INTRAMUSCULAR | Status: AC | PRN
  Administered 2019-12-14: 40 mg via INTRA_ARTICULAR

## 2019-12-14 NOTE — Progress Notes (Signed)
Office Visit Note   Patient: Tracey Armstrong           Date of Birth: 1935-03-28           MRN: [id number] Visit Date: 12/14/2019              Requested by: Martinique, [name], MD [address] [place],  [place] [address] PCP: Martinique, [name], MD   Assessment & Plan: Visit Diagnoses:  1. Trochanteric bursitis, left hip   2. Primary osteoarthritis of left knee     Plan: She shown IT band stretching exercises.  She knows to wait at least 3 months between hip injections knee injections.  She asked about her foot pain that she primarily has at night plantar aspect of the foot.  Tenderness.  Tenderness over the medial tubercle of the calcaneus over the plantar fascial consistent with plantar fasciitis.  Showed her some stretching exercises she can perform on her own.  She will follow-up with [place] as needed.  Follow-Up Instructions: Return if symptoms worsen or fail to improve.   Orders:  Orders Placed This Encounter  Procedures  . Large Joint Inj  . Large Joint Inj   No orders of the defined types were placed in this encounter.     Procedures: Large Joint Inj: L knee on 12/14/2019 1:52 PM Indications: pain Details: 22 Armstrong 1.5 in needle, anterolateral approach  Arthrogram: No  Medications: 3 mL lidocaine 1 %; 40 mg methylPREDNISolone acetate 40 MG/ML Outcome: tolerated well, no immediate complications Procedure, treatment alternatives, risks and benefits explained, specific risks discussed. Consent was given by the patient. Immediately prior to procedure a time out was called to verify the correct patient, procedure, equipment, support staff and site/side marked as required. Patient was prepped and draped in the usual sterile fashion.   Large Joint Inj: L greater trochanter on 12/14/2019 1:52 PM Indications: pain Details: 22 Armstrong 1.5 in needle, lateral approach  Arthrogram: No  Medications: 3 mL lidocaine 1 %; 40 mg methylPREDNISolone acetate 40 MG/ML Outcome: tolerated well, no  immediate complications Procedure, treatment alternatives, risks and benefits explained, specific risks discussed. Consent was given by the patient. Immediately prior to procedure a time out was called to verify the correct patient, procedure, equipment, support staff and site/side marked as required. Patient was prepped and draped in the usual sterile fashion.       Clinical Data: No additional findings.   Subjective: Chief Complaint  Patient presents with  . Left Knee - Pain  . Left Hip - Pain    HPI Tracey Armstrong comes in today due to left knee pain left hip pain.  She is asking for injections both hip and knee.  She has had previous trochanteric bursitis which injections have helped.  She also has osteoarthritis of left knee these injections have been helpful in the past.  She is going to physical therapy and she feels this is helped with her overall gait and balance. Review of Systems Negative for fevers chills shortness of breath chest pain  Objective: Vital Signs: There were no vitals taken for this visit.  Physical Exam Constitutional:      Appearance: She is not ill-appearing or diaphoretic.  Pulmonary:     Effort: Pulmonary effort is normal.  Neurological:     Mental Status: She is alert and oriented to person, place, and time.  Psychiatric:        Mood and Affect: Mood normal.     Ortho Exam  Left knee full extension full flexion no instability with valgus varus stressing.  Tenderness along medial lateral joint lines.  No abnormal warmth erythema or effusion.  Left hip good range of motion without pain.  Tenderness over the trochanteric region. Specialty Comments:  No specialty comments available.  Imaging: No results found.   PMFS History: Patient Active Problem List   Diagnosis Date Noted  . Urine, incontinence, stress female 02/07/2019  . Knee osteoarthritis 02/01/2018  . Iron deficiency anemia 10/28/2017  . Rhinitis, allergic 07/27/2017  . GERD  (gastroesophageal reflux disease) 06/16/2017  . Chronic pain disorder 06/16/2017  . Vitamin D deficiency 05/25/2017  . Hyperlipidemia 05/25/2017  . 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 Pain 02/26/2017  . Chronic low back pain 12/02/2016  . Abdominal pain 03/26/2016  . Essential (primary) hypertension 03/26/2016  . 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 Peptic esophagitis 03/26/2016  . Neuralgia neuritis, sciatic nerve 03/26/2016  . 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[place]) 06/20/2015  . Chronic insomnia 03/30/2015  . Chronic migraine 01/10/2015  . Depression 04/20/2014  . Generalized anxiety disorder 04/20/2014  . Chronic back pain 04/20/2014  . Chronic daily headache 04/20/2014  . Essential hypertension 04/20/2014  . Migraine headache 04/19/2014   Past Medical History:  Diagnosis Date  . Anxiety   . Arthritis   . Chronic insomnia 03/30/2015  . Chronic low back pain 12/02/2016  . Depression   . GERD (gastroesophageal reflux disease)   . Hiatal hernia   . High cholesterol   . Hypertelorism   . Hypertension   . Meningioma ([place])   . Migraine     Family History  Problem Relation Age of Onset  . Migraines Father   . Stroke Father   . Heart Problems Mother   . Epilepsy Brother     Past Surgical History:  Procedure Laterality Date  . STOMACH SURGERY     Social History   Occupational History  . Occupation: Retired  Tobacco Use  . Smoking status: Never Smoker  . Smokeless tobacco: Never Used  Substance and Sexual Activity  . Alcohol use: No  . Drug use: No  . Sexual activity: Not Currently

## 2019-12-16 ENCOUNTER — Other Ambulatory Visit: Payer: Self-pay

## 2019-12-19 ENCOUNTER — Ambulatory Visit (INDEPENDENT_AMBULATORY_CARE_PROVIDER_SITE_OTHER): Payer: Medicare Other | Admitting: Family Medicine

## 2019-12-19 ENCOUNTER — Encounter: Payer: Self-pay | Admitting: Family Medicine

## 2019-12-19 VITALS — BP 124/60 | HR 92 | Temp 96.4°F | Resp 16 | Ht 60.5 in | Wt 154.0 lb

## 2019-12-19 DIAGNOSIS — N393 Stress incontinence (female) (male): Secondary | ICD-10-CM | POA: Diagnosis not present

## 2019-12-19 DIAGNOSIS — G43709 Chronic migraine without aura, not intractable, without status migrainosus: Secondary | ICD-10-CM | POA: Diagnosis not present

## 2019-12-19 DIAGNOSIS — F411 Generalized anxiety disorder: Secondary | ICD-10-CM | POA: Diagnosis not present

## 2019-12-19 DIAGNOSIS — IMO0002 Reserved for concepts with insufficient information to code with codable children: Secondary | ICD-10-CM

## 2019-12-19 DIAGNOSIS — I10 Essential (primary) hypertension: Secondary | ICD-10-CM

## 2019-12-19 DIAGNOSIS — E559 Vitamin D deficiency, unspecified: Secondary | ICD-10-CM | POA: Diagnosis not present

## 2019-12-19 LAB — BASIC METABOLIC PANEL
BUN: 19 mg/dL (ref 6–23)
CO2: 27 mEq/L (ref 19–32)
Calcium: 10 mg/dL (ref 8.4–10.5)
Chloride: 100 mEq/L (ref 96–112)
Creatinine, Ser: 0.89 mg/dL (ref 0.40–1.20)
GFR: 60.57 mL/min (ref >60.00)
Glucose, Bld: 80 mg/dL (ref 70–99)
Potassium: 4 mEq/L (ref 3.5–5.1)
Sodium: 137 mEq/L (ref 135–145)

## 2019-12-19 LAB — VITAMIN D 25 HYDROXY (VIT D DEFICIENCY, FRACTURES): VITD: 54.32 ng/mL (ref 30.00–100.00)

## 2019-12-19 MED ORDER — OXYBUTYNIN CHLORIDE ER 10 MG PO TB24: 10.0000 mg | ORAL_TABLET | Freq: Every day | ORAL | 2 refills | Status: DC

## 2019-12-19 MED ORDER — VERAPAMIL HCL ER 180 MG PO CP24: ORAL_CAPSULE | ORAL | 3 refills | Status: DC

## 2019-12-19 MED ORDER — VITAMIN D (ERGOCALCIFEROL) 1.25 MG (50000 UNIT) PO CAPS: ORAL_CAPSULE | ORAL | 2 refills | Status: DC

## 2019-12-19 NOTE — Patient Instructions (Addendum)
A few things to remember from today's visit:   Essential (primary) hypertension - Plan: Basic metabolic panel  Generalized anxiety disorder  Chronic migraine  Vitamin D deficiency, unspecified - Plan: Vitamin D, Ergocalciferol, (DRISDOL) 1.25 MG (50000 UNIT) CAPS capsule, VITAMIN D 25 Hydroxy (Vit-D Deficiency, Fractures)  Essential hypertension - Plan: verapamil (VERELAN PM) 180 MG 24 hr capsule  Urine, incontinence, stress female - Plan: oxybutynin (DITROPAN XL) 10 MG 24 hr tablet   Please be sure medication list is accurate. If a new problem present, please set up appointment sooner than planned today.

## 2019-12-19 NOTE — Progress Notes (Signed)
HPI:   Tracey Armstrong is a 85 y.o. female, who is here today for chronic disease management.  She was last seen on 08/17/19. Since her last visit she has seen Dr Delice Lesch.  HTN: Currently she is on Verapamil 180 mg daily. Denies severe/frequent headache, visual changes, chest pain, dyspnea, palpitation, focal weakness, or edema. BP has been "good."  Lab Results  Component Value Date   CREATININE 1.00 04/05/2019   BUN 20 04/05/2019   NA 134 (L) 04/05/2019   K 4.8 04/05/2019   CL 100 04/05/2019   CO2 26 04/05/2019   Anxiety and insomnia: She takes Alprazolam and Gabapentin at bedtime. Alprazolam was last filled on 11/18/19.  She states that she is having trouble getting refills.  She took 2 Alprazolam before dental procedure, so she ran out earlier.  She has not tolerated several SSRI's and SNRI's, aggravated migraine/headaches.  She lives in a independent living facility. She is excited because on 12/26/19 she will be able to leave her room and eat at the dinner.  Lower back pain with radiculopathy: Started on Lyrica, still taking Gabapentin. According to pt, provider is aware of her taking both medications. LLE pain, currently she is doing PT.  She is on Lyrica 25 mg daily. She the thinks Gabapentin helps more than Lyrica. According to pt,provider knows she is taking both. Last Wednesday she got a hip injection, helped some. Back pain, Zanaflex seems to be helping. Taking Tylenol. She is following prn.  No falls since her last visit.  Reporting migraines as well controlled. She is having headaches once per month. Requesting refills for Maxalt, which is still helping. States that 10 tabs lasts all year.  On 06/01/19 she had a brain MR to follow on meningiomaI: 9 x 5 mm right parietal meningioma, unchanged in size since brain MRI 06/20/2016. Mild generalized parenchymal atrophy with chronic small vessel ischemic disease.  Urine incontinence: She is on  Oxybutynin XL 10 mg daily, she has noted great improvement in urinary frequency/urgency. Nocturia x 0, able to sleep through the night. No side effects reported.   Vit D deficiency: She is on Ergocalciferol 50,000 U q 2 weeks. Tolerating medication better. Daily Vit D OTC was causing headaches.  Review of Systems  Constitutional: Negative for activity change, appetite change, fatigue and fever.  HENT: Negative for mouth sores, nosebleeds and sore throat.   Respiratory: Negative for cough and wheezing.   Gastrointestinal: Negative for abdominal pain, nausea and vomiting.       Negative for changes in bowel habits.  Genitourinary: Negative for decreased urine volume and hematuria.  Musculoskeletal: Positive for back pain and gait problem.  Skin: Negative for rash.  Neurological: Negative for tremors, syncope and facial asymmetry.  Psychiatric/Behavioral: Positive for sleep disturbance. The patient is nervous/anxious.   Rest of ROS, see pertinent positives sand negatives in HPI  Current Outpatient Medications on File Prior to Visit  Medication Sig Dispense Refill  . ALPRAZolam (XANAX) 1 MG tablet TAKE 1/2 TABLET(0.5 MG) BY MOUTH TWICE DAILY AS NEEDED FOR ANXIETY 30 tablet 2  . gabapentin (NEURONTIN) 300 MG capsule TAKE 1 CAPSULE BY MOUTH AT BEDTIME 30 capsule 3  . lansoprazole (PREVACID) 15 MG capsule 1 tab at 7:00 am (30 min before breakfast). 90 capsule 1  . Magnesium 250 MG TABS Take 250 mg by mouth daily. 2 PO DAILY    . MAXALT-MLT 10 MG disintegrating tablet DISSOLVE ONE TABLET BY MOUTH AS NEEDED HEADACHE 12  tablet 1  . methylPREDNISolone (MEDROL) 4 MG tablet Medrol dose pack. Take as instructed 21 tablet 0  . pregabalin (LYRICA) 50 MG capsule TAKE 1 CAPSULE(50 MG) BY MOUTH THREE TIMES DAILY 90 capsule 3  . Riboflavin 400 MG CAPS Take 1 capsule (400 mg total) by mouth daily.    Marland Kitchen tiZANidine (ZANAFLEX) 4 MG tablet Take 1 tablet (4 mg total) by mouth 2 (two) times daily as needed for  muscle spasms. 60 tablet 2  . tretinoin (RETIN-A) 0.1 % cream APPLY TO AFFECTED AREA EVERY DAY AT BEDTIME 45 g 2  . Turmeric 500 MG CAPS Take 1 capsule by mouth daily.     No current facility-administered medications on file prior to visit.     Past Medical History:  Diagnosis Date  . Anxiety   . Arthritis   . Chronic insomnia 03/30/2015  . Chronic low back pain 12/02/2016  . Depression   . GERD (gastroesophageal reflux disease)   . Hiatal hernia   . High cholesterol   . Hypertelorism   . Hypertension   . Meningioma ([place])   . Migraine    Allergies  Allergen Reactions  . Dihydrotachysterol     Other reaction(s): Other (See Comments)  . Trazodone And Nefazodone Other (See Comments)    Worsens headaches  . Valium [Diazepam] Other (See Comments)    headaches  . Citalopram     Other reaction(s): Headache  . Other     "States all antidepressants cause migraines."    Social History   Socioeconomic History  . Marital status: Widowed    Spouse name: Not on file  . Number of children: 4  . Years of education: 74  . Highest education level: Not on file  Occupational History  . Occupation: Retired  Tobacco Use  . Smoking status: Never Smoker  . Smokeless tobacco: Never Used  Substance and Sexual Activity  . Alcohol use: No  . Drug use: No  . Sexual activity: Not Currently  Other Topics Concern  . Not on file  Social History Narrative   Lives in [place]      Patient drinks 1 glass of tea daily.      Patient is right handed.   Social Determinants of Health   Financial Resource Strain:   . Difficulty of Paying Living Expenses: Not on file  Food Insecurity:   . Worried About Charity fundraiser in the Last Year: Not on file  . Ran Out of Food in the Last Year: Not on file  Transportation Needs:   . Lack of Transportation (Medical): Not on file  . Lack of Transportation (Non-Medical): Not on file  Physical Activity:   . Days of Exercise per Week: Not on  file  . Minutes of Exercise per Session: Not on file  Stress:   . Feeling of Stress : Not on file  Social Connections:   . Frequency of Communication with Friends and Family: Not on file  . Frequency of Social Gatherings with Friends and Family: Not on file  . Attends Religious Services: Not on file  . Active Member of Clubs or Organizations: Not on file  . Attends Archivist Meetings: Not on file  . Marital Status: Not on file   Vitals:   12/19/19 0856  BP: 124/60  Pulse: 92  Resp: 16  Temp: (!) 96.4 F (35.8 C)  SpO2: 97%   Body mass index is 29.58 kg/m.  Physical Exam  Nursing note  and vitals reviewed. Constitutional: She is oriented to person, place, and time. She appears well-developed. No distress.  HENT:  Head: Normocephalic and atraumatic.  Mouth/Throat: Oropharynx is clear and moist and mucous membranes are normal.  Eyes: Pupils are equal, round, and reactive to light. Conjunctivae are normal.  Cardiovascular: Normal rate and regular rhythm.  No murmur heard. Pulses:      Dorsalis pedis pulses are 2+ on the right side and 2+ on the left side.  Respiratory: Effort normal and breath sounds normal. No respiratory distress.  GI: Soft. She exhibits no mass. There is no hepatomegaly. There is no abdominal tenderness.  Musculoskeletal:        General: No edema.     Thoracic back: Deformity (kyphosis) present.     Lumbar back: No tenderness or bony tenderness.  Lymphadenopathy:    She has no cervical adenopathy.  Neurological: She is alert and oriented to person, place, and time. She has normal strength. No cranial nerve deficit. Gait normal.  Skin: Skin is warm. No rash noted. No erythema.  Psychiatric: Her mood appears anxious.  Well groomed, good eye contact.   ASSESSMENT AND PLAN:  Tracey Armstrong was seen today for chronic disease management.  Orders Placed This Encounter  Procedures  . VITAMIN D 25 Hydroxy (Vit-D Deficiency, Fractures)  .  Basic metabolic panel   Lab Results  Component Value Date   CREATININE 0.89 12/19/2019   BUN 19 12/19/2019   NA 137 12/19/2019   K 4.0 12/19/2019   CL 100 12/19/2019   CO2 27 12/19/2019    1.Essential hypertension BP adequately controlled. No changes in current management. Continue monitoring BP regularly.  2. Generalized anxiety disorder Stable. She is supposed to have 2 refills at her pharmacy. She did not bring her bottle today, she will check at home and let me know. We reviewed some side effects of benzo meds. Ider controlled subs report reviewed with pt. She has tolerated well, no changes today.  3. Chronic migraine Improved. She has a refill on her Maxalt bottle. No changes in current management.  4. Vitamin D deficiency, unspecified No changes in current management, will follow labs done today and will give further recommendations accordingly.  - Vitamin D, Ergocalciferol, (DRISDOL) 1.25 MG (50000 UNIT) CAPS capsule; 1 cap every 2 weeks.  Dispense: 7 capsule; Refill: 2  - verapamil (VERELAN PM) 180 MG 24 hr capsule; TAKE 1 CAPSULE(180 MG) BY MOUTH DAILY  Dispense: 90 capsule; Refill: 3  5. Urine, incontinence, stress female Problem has improved. No changes in current management.  - oxybutynin (DITROPAN XL) 10 MG 24 hr tablet; Take 1 tablet (10 mg total) by mouth at bedtime.  Dispense: 90 tablet; Refill: 2   Return in about 4 months (around 04/19/2020) for anxiety,insomnia..     Jillian Pianka G. Martinique, MD  [facility]. [place] office.

## 2019-12-20 ENCOUNTER — Telehealth: Payer: Self-pay | Admitting: Family Medicine

## 2019-12-20 DIAGNOSIS — M545 Low back pain: Secondary | ICD-10-CM | POA: Diagnosis not present

## 2019-12-20 DIAGNOSIS — R2681 Unsteadiness on feet: Secondary | ICD-10-CM | POA: Diagnosis not present

## 2019-12-20 DIAGNOSIS — M25562 Pain in left knee: Secondary | ICD-10-CM | POA: Diagnosis not present

## 2019-12-20 DIAGNOSIS — M6281 Muscle weakness (generalized): Secondary | ICD-10-CM | POA: Diagnosis not present

## 2019-12-20 NOTE — Telephone Encounter (Signed)
Approval faxed back to  pharmacy 

## 2019-12-20 NOTE — Telephone Encounter (Signed)
[employer] called to let the PCP know that the PA for Maxalt has been approved.

## 2019-12-22 ENCOUNTER — Encounter: Payer: Self-pay | Admitting: Family Medicine

## 2019-12-22 DIAGNOSIS — M545 Low back pain: Secondary | ICD-10-CM | POA: Diagnosis not present

## 2019-12-22 DIAGNOSIS — M6281 Muscle weakness (generalized): Secondary | ICD-10-CM | POA: Diagnosis not present

## 2019-12-22 DIAGNOSIS — M25562 Pain in left knee: Secondary | ICD-10-CM | POA: Diagnosis not present

## 2019-12-22 DIAGNOSIS — R2681 Unsteadiness on feet: Secondary | ICD-10-CM | POA: Diagnosis not present

## 2019-12-22 DIAGNOSIS — Z1159 Encounter for screening for other viral diseases: Secondary | ICD-10-CM | POA: Diagnosis not present

## 2019-12-22 DIAGNOSIS — Z20828 Contact with and (suspected) exposure to other viral communicable diseases: Secondary | ICD-10-CM | POA: Diagnosis not present

## 2019-12-26 DIAGNOSIS — Z20828 Contact with and (suspected) exposure to other viral communicable diseases: Secondary | ICD-10-CM | POA: Diagnosis not present

## 2019-12-26 DIAGNOSIS — Z1159 Encounter for screening for other viral diseases: Secondary | ICD-10-CM | POA: Diagnosis not present

## 2019-12-27 DIAGNOSIS — M6281 Muscle weakness (generalized): Secondary | ICD-10-CM | POA: Diagnosis not present

## 2019-12-27 DIAGNOSIS — M545 Low back pain: Secondary | ICD-10-CM | POA: Diagnosis not present

## 2019-12-27 DIAGNOSIS — M25562 Pain in left knee: Secondary | ICD-10-CM | POA: Diagnosis not present

## 2019-12-27 DIAGNOSIS — R2681 Unsteadiness on feet: Secondary | ICD-10-CM | POA: Diagnosis not present

## 2019-12-29 DIAGNOSIS — R2681 Unsteadiness on feet: Secondary | ICD-10-CM | POA: Diagnosis not present

## 2019-12-29 DIAGNOSIS — Z20828 Contact with and (suspected) exposure to other viral communicable diseases: Secondary | ICD-10-CM | POA: Diagnosis not present

## 2019-12-29 DIAGNOSIS — M545 Low back pain: Secondary | ICD-10-CM | POA: Diagnosis not present

## 2019-12-29 DIAGNOSIS — M25562 Pain in left knee: Secondary | ICD-10-CM | POA: Diagnosis not present

## 2019-12-29 DIAGNOSIS — M6281 Muscle weakness (generalized): Secondary | ICD-10-CM | POA: Diagnosis not present

## 2019-12-29 DIAGNOSIS — Z1159 Encounter for screening for other viral diseases: Secondary | ICD-10-CM | POA: Diagnosis not present

## 2020-01-02 DIAGNOSIS — Z1159 Encounter for screening for other viral diseases: Secondary | ICD-10-CM | POA: Diagnosis not present

## 2020-01-02 DIAGNOSIS — Z20828 Contact with and (suspected) exposure to other viral communicable diseases: Secondary | ICD-10-CM | POA: Diagnosis not present

## 2020-01-03 DIAGNOSIS — M25562 Pain in left knee: Secondary | ICD-10-CM | POA: Diagnosis not present

## 2020-01-03 DIAGNOSIS — R2681 Unsteadiness on feet: Secondary | ICD-10-CM | POA: Diagnosis not present

## 2020-01-03 DIAGNOSIS — M6281 Muscle weakness (generalized): Secondary | ICD-10-CM | POA: Diagnosis not present

## 2020-01-03 DIAGNOSIS — M545 Low back pain: Secondary | ICD-10-CM | POA: Diagnosis not present

## 2020-01-09 DIAGNOSIS — Z1159 Encounter for screening for other viral diseases: Secondary | ICD-10-CM | POA: Diagnosis not present

## 2020-01-09 DIAGNOSIS — Z20828 Contact with and (suspected) exposure to other viral communicable diseases: Secondary | ICD-10-CM | POA: Diagnosis not present

## 2020-01-16 DIAGNOSIS — Z1159 Encounter for screening for other viral diseases: Secondary | ICD-10-CM | POA: Diagnosis not present

## 2020-01-16 DIAGNOSIS — Z20828 Contact with and (suspected) exposure to other viral communicable diseases: Secondary | ICD-10-CM | POA: Diagnosis not present

## 2020-01-18 DIAGNOSIS — H52203 Unspecified astigmatism, bilateral: Secondary | ICD-10-CM | POA: Diagnosis not present

## 2020-01-18 DIAGNOSIS — H04123 Dry eye syndrome of bilateral lacrimal glands: Secondary | ICD-10-CM | POA: Diagnosis not present

## 2020-01-23 DIAGNOSIS — Z20828 Contact with and (suspected) exposure to other viral communicable diseases: Secondary | ICD-10-CM | POA: Diagnosis not present

## 2020-01-23 DIAGNOSIS — Z1159 Encounter for screening for other viral diseases: Secondary | ICD-10-CM | POA: Diagnosis not present

## 2020-01-30 DIAGNOSIS — Z1159 Encounter for screening for other viral diseases: Secondary | ICD-10-CM | POA: Diagnosis not present

## 2020-01-30 DIAGNOSIS — Z20828 Contact with and (suspected) exposure to other viral communicable diseases: Secondary | ICD-10-CM | POA: Diagnosis not present

## 2020-02-09 DIAGNOSIS — Z1159 Encounter for screening for other viral diseases: Secondary | ICD-10-CM | POA: Diagnosis not present

## 2020-02-09 DIAGNOSIS — Z20828 Contact with and (suspected) exposure to other viral communicable diseases: Secondary | ICD-10-CM | POA: Diagnosis not present

## 2020-02-11 ENCOUNTER — Other Ambulatory Visit: Payer: Self-pay | Admitting: Family Medicine

## 2020-02-11 DIAGNOSIS — G8929 Other chronic pain: Secondary | ICD-10-CM

## 2020-02-13 DIAGNOSIS — Z20828 Contact with and (suspected) exposure to other viral communicable diseases: Secondary | ICD-10-CM | POA: Diagnosis not present

## 2020-02-13 DIAGNOSIS — Z1159 Encounter for screening for other viral diseases: Secondary | ICD-10-CM | POA: Diagnosis not present

## 2020-02-15 ENCOUNTER — Telehealth: Payer: Self-pay | Admitting: Family Medicine

## 2020-02-15 NOTE — Telephone Encounter (Signed)
Medication: alprazolam, gabapentin, tizanidine  Pharmacy: [facility]   Pt would like to see if she could have a 90 day supply on her medication.

## 2020-02-15 NOTE — Telephone Encounter (Signed)
Message Routed to PCP for review and approval. 

## 2020-02-16 ENCOUNTER — Other Ambulatory Visit: Payer: Self-pay | Admitting: Family Medicine

## 2020-02-16 DIAGNOSIS — Z20828 Contact with and (suspected) exposure to other viral communicable diseases: Secondary | ICD-10-CM | POA: Diagnosis not present

## 2020-02-16 DIAGNOSIS — Z1159 Encounter for screening for other viral diseases: Secondary | ICD-10-CM | POA: Diagnosis not present

## 2020-02-16 DIAGNOSIS — F411 Generalized anxiety disorder: Secondary | ICD-10-CM

## 2020-02-17 NOTE — Telephone Encounter (Signed)
Noted  

## 2020-02-17 NOTE — Telephone Encounter (Addendum)
Pt needs her medication for Alprazolam refilled. Pt states she is out of this medication and needs it filled before the weekend. She states she is stressed out trying to get it refilled and needs the medication. She also stated that she has to call every month in order to get this filled and would like to know if she can send in refills for 90-days so she doesn't have to call every month.  She was able to get the other two listed in earlier note but this medication was not sent.   Pharmacy: [facility]: [phone]

## 2020-02-17 NOTE — Telephone Encounter (Signed)
Rx for Alprazolam sent. Tizanidine and Gabapentin were sent earlier this week. Thanks, BJ

## 2020-02-17 NOTE — Telephone Encounter (Signed)
Message Routed to PCP for review and approval. 

## 2020-02-20 DIAGNOSIS — Z1159 Encounter for screening for other viral diseases: Secondary | ICD-10-CM | POA: Diagnosis not present

## 2020-02-20 DIAGNOSIS — Z20828 Contact with and (suspected) exposure to other viral communicable diseases: Secondary | ICD-10-CM | POA: Diagnosis not present

## 2020-02-27 DIAGNOSIS — Z20828 Contact with and (suspected) exposure to other viral communicable diseases: Secondary | ICD-10-CM | POA: Diagnosis not present

## 2020-02-27 DIAGNOSIS — Z1159 Encounter for screening for other viral diseases: Secondary | ICD-10-CM | POA: Diagnosis not present

## 2020-03-01 DIAGNOSIS — Z1159 Encounter for screening for other viral diseases: Secondary | ICD-10-CM | POA: Diagnosis not present

## 2020-03-01 DIAGNOSIS — Z20828 Contact with and (suspected) exposure to other viral communicable diseases: Secondary | ICD-10-CM | POA: Diagnosis not present

## 2020-03-05 DIAGNOSIS — Z1159 Encounter for screening for other viral diseases: Secondary | ICD-10-CM | POA: Diagnosis not present

## 2020-03-05 DIAGNOSIS — Z20828 Contact with and (suspected) exposure to other viral communicable diseases: Secondary | ICD-10-CM | POA: Diagnosis not present

## 2020-03-15 DIAGNOSIS — Z20828 Contact with and (suspected) exposure to other viral communicable diseases: Secondary | ICD-10-CM | POA: Diagnosis not present

## 2020-03-15 DIAGNOSIS — Z1159 Encounter for screening for other viral diseases: Secondary | ICD-10-CM | POA: Diagnosis not present

## 2020-03-19 DIAGNOSIS — Z1159 Encounter for screening for other viral diseases: Secondary | ICD-10-CM | POA: Diagnosis not present

## 2020-03-19 DIAGNOSIS — Z20828 Contact with and (suspected) exposure to other viral communicable diseases: Secondary | ICD-10-CM | POA: Diagnosis not present

## 2020-03-23 ENCOUNTER — Telehealth: Payer: Self-pay | Admitting: Physical Medicine and Rehabilitation

## 2020-03-23 NOTE — Telephone Encounter (Signed)
Pt called stating she would like to have another injection pt had Lt L4-5 TF 08/24/2019. Pt states no new injuries/traumas ok to r/s?

## 2020-03-23 NOTE — Telephone Encounter (Signed)
Patient called.   She is wanting to set up an appointment for an injection.   Call back: [phone]

## 2020-03-23 NOTE — Telephone Encounter (Signed)
Called pt to advise that I have sent a message to Dr. Ernestina Patches to be reviewed once he responds I will call her on what Dr. Ernestina Patches advises.

## 2020-03-23 NOTE — Telephone Encounter (Signed)
Patient called.   She is wanting to set up an appointment for her back pain.   Call back: [phone]

## 2020-03-26 ENCOUNTER — Telehealth: Payer: Self-pay | Admitting: Physical Medicine and Rehabilitation

## 2020-03-26 ENCOUNTER — Other Ambulatory Visit: Payer: Self-pay | Admitting: Physician Assistant

## 2020-03-26 ENCOUNTER — Telehealth: Payer: Self-pay

## 2020-03-26 DIAGNOSIS — Z20828 Contact with and (suspected) exposure to other viral communicable diseases: Secondary | ICD-10-CM | POA: Diagnosis not present

## 2020-03-26 DIAGNOSIS — Z1159 Encounter for screening for other viral diseases: Secondary | ICD-10-CM | POA: Diagnosis not present

## 2020-03-26 NOTE — Telephone Encounter (Signed)
ok 

## 2020-03-26 NOTE — Telephone Encounter (Signed)
Please advise 

## 2020-03-26 NOTE — Telephone Encounter (Signed)
Pt called stating she would like to get scheduled for an inj.   [phone]

## 2020-03-26 NOTE — Telephone Encounter (Signed)
Requests refill of pregabalin (LYRICA) 50 MG capsule

## 2020-03-27 NOTE — Telephone Encounter (Signed)
Pt is scheduled for 04/18/20 with a driver.

## 2020-04-04 ENCOUNTER — Encounter (HOSPITAL_COMMUNITY): Payer: Self-pay

## 2020-04-04 ENCOUNTER — Inpatient Hospital Stay (HOSPITAL_COMMUNITY)
Admit: 2020-04-04 | Discharge: 2020-04-13 | DRG: 641 | Disposition: A | Discharge status: Skilled Nursing Facility | Payer: Medicare Other | Source: Skilled Nursing Facility | Attending: Family Medicine | Admitting: Family Medicine

## 2020-04-04 ENCOUNTER — Emergency Department (HOSPITAL_COMMUNITY): Payer: Medicare Other

## 2020-04-04 DIAGNOSIS — Z79899 Other long term (current) drug therapy: Secondary | ICD-10-CM

## 2020-04-04 DIAGNOSIS — Z20822 Contact with and (suspected) exposure to covid-19: Secondary | ICD-10-CM | POA: Diagnosis present

## 2020-04-04 DIAGNOSIS — F419 Anxiety disorder, unspecified: Secondary | ICD-10-CM | POA: Diagnosis present

## 2020-04-04 DIAGNOSIS — M545 Other chronic pain: Secondary | ICD-10-CM

## 2020-04-04 DIAGNOSIS — S32019A Unspecified fracture of first lumbar vertebra, initial encounter for closed fracture: Secondary | ICD-10-CM

## 2020-04-04 DIAGNOSIS — E860 Dehydration: Secondary | ICD-10-CM | POA: Diagnosis not present

## 2020-04-04 DIAGNOSIS — M25552 Pain in left hip: Secondary | ICD-10-CM

## 2020-04-04 DIAGNOSIS — M25559 Pain in unspecified hip: Secondary | ICD-10-CM

## 2020-04-04 DIAGNOSIS — F329 Major depressive disorder, single episode, unspecified: Secondary | ICD-10-CM | POA: Diagnosis present

## 2020-04-04 DIAGNOSIS — G8929 Other chronic pain: Secondary | ICD-10-CM

## 2020-04-04 DIAGNOSIS — Z66 Do not resuscitate: Secondary | ICD-10-CM | POA: Diagnosis present

## 2020-04-04 DIAGNOSIS — S32010A Wedge compression fracture of first lumbar vertebra, initial encounter for closed fracture: Secondary | ICD-10-CM

## 2020-04-04 DIAGNOSIS — M5459 Other low back pain: Secondary | ICD-10-CM

## 2020-04-04 DIAGNOSIS — R262 Difficulty in walking, not elsewhere classified: Secondary | ICD-10-CM

## 2020-04-04 DIAGNOSIS — F411 Generalized anxiety disorder: Secondary | ICD-10-CM

## 2020-04-04 DIAGNOSIS — M4316 Spondylolisthesis, lumbar region: Secondary | ICD-10-CM | POA: Diagnosis present

## 2020-04-04 DIAGNOSIS — K5900 Constipation, unspecified: Secondary | ICD-10-CM | POA: Diagnosis not present

## 2020-04-04 DIAGNOSIS — K219 Gastro-esophageal reflux disease without esophagitis: Secondary | ICD-10-CM | POA: Diagnosis present

## 2020-04-04 DIAGNOSIS — I10 Essential (primary) hypertension: Secondary | ICD-10-CM | POA: Diagnosis present

## 2020-04-04 DIAGNOSIS — M48061 Spinal stenosis, lumbar region without neurogenic claudication: Secondary | ICD-10-CM | POA: Diagnosis present

## 2020-04-04 DIAGNOSIS — N320 Bladder-neck obstruction: Secondary | ICD-10-CM | POA: Diagnosis present

## 2020-04-04 DIAGNOSIS — E7800 Pure hypercholesterolemia, unspecified: Secondary | ICD-10-CM | POA: Diagnosis present

## 2020-04-04 DIAGNOSIS — F32A Depression, unspecified: Secondary | ICD-10-CM | POA: Diagnosis present

## 2020-04-04 DIAGNOSIS — I959 Hypotension, unspecified: Secondary | ICD-10-CM | POA: Diagnosis present

## 2020-04-04 DIAGNOSIS — M25551 Pain in right hip: Secondary | ICD-10-CM

## 2020-04-04 DIAGNOSIS — M4856XA Collapsed vertebra, not elsewhere classified, lumbar region, initial encounter for fracture: Secondary | ICD-10-CM | POA: Diagnosis present

## 2020-04-04 LAB — CBC WITH DIFFERENTIAL/PLATELET
Abs Immature Granulocytes: 0.01 10*3/uL (ref 0.00–0.07)
Basophils Absolute: 0 10*3/uL (ref 0.0–0.1)
Basophils Relative: 0 %
Eosinophils Absolute: 0 10*3/uL (ref 0.0–0.5)
Eosinophils Relative: 0 %
HCT: 41.1 % (ref 36.0–46.0)
Hemoglobin: 13.7 g/dL (ref 12.0–15.0)
Immature Granulocytes: 0 %
Lymphocytes Relative: 24 %
Lymphs Abs: 1.8 10*3/uL (ref 0.7–4.0)
MCH: 31.2 pg (ref 26.0–34.0)
MCHC: 33.3 g/dL (ref 30.0–36.0)
MCV: 93.6 fL (ref 80.0–100.0)
Monocytes Absolute: 0.7 10*3/uL (ref 0.1–1.0)
Monocytes Relative: 10 %
Neutro Abs: 4.7 10*3/uL (ref 1.7–7.7)
Neutrophils Relative %: 66 %
Platelets: 219 10*3/uL (ref 150–400)
RBC: 4.39 MIL/uL (ref 3.87–5.11)
RDW: 12.2 % (ref 11.5–15.5)
WBC: 7.3 10*3/uL (ref 4.0–10.5)
nRBC: 0 % (ref 0.0–0.2)

## 2020-04-04 LAB — BASIC METABOLIC PANEL
Anion gap: 12 (ref 5–15)
BUN: 8 mg/dL (ref 8–23)
CO2: 21 mmol/L — ABNORMAL LOW (ref 22–32)
Calcium: 9.2 mg/dL (ref 8.9–10.3)
Chloride: 105 mmol/L (ref 98–111)
Creatinine, Ser: 0.81 mg/dL (ref 0.44–1.00)
GFR calc Af Amer: >60 mL/min (ref >60)
GFR calc non Af Amer: >60 mL/min (ref >60)
Glucose, Bld: 104 mg/dL — ABNORMAL HIGH (ref 70–99)
Potassium: 4 mmol/L (ref 3.5–5.1)
Sodium: 138 mmol/L (ref 135–145)

## 2020-04-04 LAB — SARS CORONAVIRUS 2 BY RT PCR (HOSPITAL ORDER, PERFORMED IN ~~LOC~~ HOSPITAL LAB): SARS Coronavirus 2: NEGATIVE

## 2020-04-04 MED ORDER — SUMATRIPTAN SUCCINATE 100 MG PO TABS
100.0000 mg | ORAL_TABLET | ORAL | Status: DC | PRN
  Filled 2020-04-04: qty 1

## 2020-04-04 MED ORDER — TURMERIC 500 MG PO CAPS: 1.0000 | ORAL_CAPSULE | Freq: Every day | ORAL | Status: DC

## 2020-04-04 MED ORDER — ONDANSETRON HCL 4 MG PO TABS: 4.0000 mg | ORAL_TABLET | Freq: Four times a day (QID) | ORAL | Status: DC | PRN

## 2020-04-04 MED ORDER — LIP MEDEX EX OINT
TOPICAL_OINTMENT | CUTANEOUS | Status: DC | PRN
  Filled 2020-04-04: qty 7

## 2020-04-04 MED ORDER — SENNOSIDES-DOCUSATE SODIUM 8.6-50 MG PO TABS
1.0000 | ORAL_TABLET | Freq: Every evening | ORAL | Status: DC | PRN
  Administered 2020-04-10: 1 via ORAL
  Filled 2020-04-04: qty 1

## 2020-04-04 MED ORDER — OXYBUTYNIN CHLORIDE ER 10 MG PO TB24
10.0000 mg | ORAL_TABLET | Freq: Every day | ORAL | Status: DC
  Administered 2020-04-04: 10 mg via ORAL
  Filled 2020-04-04 (×2): qty 1

## 2020-04-04 MED ORDER — ACETAMINOPHEN 325 MG PO TABS
650.0000 mg | ORAL_TABLET | Freq: Four times a day (QID) | ORAL | Status: DC | PRN
  Administered 2020-04-10 – 2020-04-13 (×6): 650 mg via ORAL
  Filled 2020-04-04 (×6): qty 2

## 2020-04-04 MED ORDER — GABAPENTIN 300 MG PO CAPS
300.0000 mg | ORAL_CAPSULE | Freq: Every day | ORAL | Status: DC
  Administered 2020-04-04 – 2020-04-12 (×9): 300 mg via ORAL
  Filled 2020-04-04 (×9): qty 1

## 2020-04-04 MED ORDER — VERAPAMIL HCL ER 180 MG PO TBCR
180.0000 mg | EXTENDED_RELEASE_TABLET | Freq: Every day | ORAL | Status: DC
  Administered 2020-04-04 – 2020-04-05 (×2): 180 mg via ORAL
  Filled 2020-04-04 (×2): qty 1

## 2020-04-04 MED ORDER — HYDROMORPHONE HCL 1 MG/ML IJ SOLN
0.5000 mg | Freq: Once | INTRAMUSCULAR | Status: AC
  Administered 2020-04-04: 0.5 mg via INTRAVENOUS
  Filled 2020-04-04: qty 1

## 2020-04-04 MED ORDER — POLYVINYL ALCOHOL 1.4 % OP SOLN
1.0000 [drp] | OPHTHALMIC | Status: DC | PRN
  Administered 2020-04-04 – 2020-04-06 (×2): 1 [drp] via OPHTHALMIC
  Filled 2020-04-04: qty 15

## 2020-04-04 MED ORDER — TIZANIDINE HCL 4 MG PO TABS
4.0000 mg | ORAL_TABLET | Freq: Two times a day (BID) | ORAL | Status: DC | PRN
  Administered 2020-04-05 – 2020-04-12 (×11): 4 mg via ORAL
  Filled 2020-04-04 (×11): qty 1

## 2020-04-04 MED ORDER — ONDANSETRON HCL 4 MG/2ML IJ SOLN
4.0000 mg | Freq: Once | INTRAMUSCULAR | Status: AC
  Administered 2020-04-04: 4 mg via INTRAVENOUS
  Filled 2020-04-04: qty 2

## 2020-04-04 MED ORDER — MORPHINE SULFATE (PF) 2 MG/ML IV SOLN
2.0000 mg | INTRAVENOUS | Status: DC | PRN
  Administered 2020-04-04 – 2020-04-05 (×3): 2 mg via INTRAVENOUS
  Filled 2020-04-04 (×4): qty 1

## 2020-04-04 MED ORDER — PANTOPRAZOLE SODIUM 20 MG PO TBEC
20.0000 mg | DELAYED_RELEASE_TABLET | Freq: Every day | ORAL | Status: DC
  Administered 2020-04-05 – 2020-04-13 (×8): 20 mg via ORAL
  Filled 2020-04-04 (×10): qty 1

## 2020-04-04 MED ORDER — ONDANSETRON HCL 4 MG/2ML IJ SOLN: 4.0000 mg | Freq: Four times a day (QID) | INTRAMUSCULAR | Status: DC | PRN

## 2020-04-04 MED ORDER — ENOXAPARIN SODIUM 40 MG/0.4ML ~~LOC~~ SOLN
40.0000 mg | SUBCUTANEOUS | Status: DC
  Administered 2020-04-04 – 2020-04-10 (×7): 40 mg via SUBCUTANEOUS
  Filled 2020-04-04 (×7): qty 0.4

## 2020-04-04 MED ORDER — ALPRAZOLAM 0.5 MG PO TABS
0.5000 mg | ORAL_TABLET | Freq: Two times a day (BID) | ORAL | Status: DC | PRN
  Administered 2020-04-04 – 2020-04-12 (×12): 0.5 mg via ORAL
  Filled 2020-04-04 (×12): qty 1

## 2020-04-04 MED ORDER — MAGNESIUM OXIDE 400 (241.3 MG) MG PO TABS
200.0000 mg | ORAL_TABLET | Freq: Every day | ORAL | Status: DC
  Administered 2020-04-04 – 2020-04-13 (×9): 200 mg via ORAL
  Filled 2020-04-04 (×11): qty 1

## 2020-04-04 MED ORDER — CYCLOBENZAPRINE HCL 10 MG PO TABS
10.0000 mg | ORAL_TABLET | Freq: Once | ORAL | Status: AC
  Administered 2020-04-04: 10 mg via ORAL
  Filled 2020-04-04: qty 1

## 2020-04-04 MED ORDER — HYDROCODONE-ACETAMINOPHEN 5-325 MG PO TABS
1.0000 | ORAL_TABLET | ORAL | Status: DC | PRN
  Administered 2020-04-05: 2 via ORAL
  Filled 2020-04-04: qty 2

## 2020-04-04 MED ORDER — ACETAMINOPHEN 650 MG RE SUPP: 650.0000 mg | Freq: Four times a day (QID) | RECTAL | Status: DC | PRN

## 2020-04-04 NOTE — ED Provider Notes (Addendum)
Patient is an 85 year old female signed out to me with acute low back pain.  Possibly acute on chronic back pain.  Questionable age-indeterminate L1 compression fracture.  Will get CT scan to further evaluate if acute or not.  Seems that she had may be exacerbated her low back doing activities recently.  Has had some improvement with IV Dilaudid.  Does live at assisted living and will contact social work to see if she can get more support if she does go home.  Will check basic labs in case we need admission for pain control.  May need PT OT and short-term skilled nursing facility as well.  CT scan shows likely subacute L1 compression fracture.  This appears consistent with her pain.  She also has severe spinal stenosis at L4-L5.  Will place patient in a TLSO brace.  Neurosurgery to be consulted and likely patient to follow-up outpatient.  Will admit for further pain management, PT/OT.  [facility] recommends pain control per primary team.  TLSO brace.  Can follow-up with Dr. Ronnald Ramp in 1 week.  This chart was dictated using voice recognition software.  Despite best efforts to proofread,  errors can occur which can change the documentation meaning.     Lennice Sites, DO 04/04/20 [place], [place], DO 04/04/20 1836

## 2020-04-04 NOTE — ED Notes (Signed)
SW Dinner ordered

## 2020-04-04 NOTE — H&P (Addendum)
History and Physical    Tracey Armstrong PPI:[id number] DOB: Jun 24, 1935 DOA: 04/04/2020  PCP: Martinique, [name], MD   Patient coming from: [name] I have personally briefly reviewed patient's old medical records in [place]  Chief Complaint: Back pain, difficulty ambulating  HPI: Tracey Armstrong is a 85 y.o. female with medical history significant for Hypertension, depression and chronic low back pain who presents to the emergency room with a 3-day history of worsening lower back pain and bilateral hip pain to the point where she is unable to get out of bed and ambulate at her baseline.  She denies recent fall or injury.  The pain is getting increasingly worse and is affecting her ability to manage independently as per her baseline.  The pain is aching and of moderate to severe intensity and does not radiate.  She denies bowel or bladder difficulties and denies numbness in the groin and inner thigh area. ED Course: On arrival to the emergency room vitals were within normal limits.  Her blood work was not remarkable.  She initially had an x-ray of the lumbar spine and pelvis showed an L1 compression fracture likely chronic.  Patient was treated with IV narcotics but continued to have intractable pain.  She subsequently had a CTA of the LS spine that showed possibly subacute L1 compression fracture with 30% height loss and 4 mm retropulsion.  No associated spinal stenosis.  So showed chronic mild to moderate spinal stenosis L3-4 and moderate to severe L4-5.  Neurosurgery was consulted from the emergency room.  Recommendations pending at the time of admission.  TLSO brace ordered.  Admission requested due to unsafe discharge to current level of care at her facility wellness for pain management..  Review of Systems: As per HPI otherwise 10 point review of systems negative.    Past Medical History:  Diagnosis Date  . Anxiety   . Arthritis   . Chronic insomnia 03/30/2015  . Chronic low back  pain 12/02/2016  . Depression   . GERD (gastroesophageal reflux disease)   . Hiatal hernia   . High cholesterol   . Hypertelorism   . Hypertension   . Meningioma ([place])   . Migraine     Past Surgical History:  Procedure Laterality Date  . STOMACH SURGERY       reports that she has never smoked. She has never used smokeless tobacco. She reports that she does not drink alcohol and does not use drugs.  Allergies  Allergen Reactions  . Dihydrotachysterol     Other reaction(s): Other (See Comments)  . Trazodone And Nefazodone Other (See Comments)    Worsens headaches  . Valium [Diazepam] Other (See Comments)    headaches  . Citalopram     Other reaction(s): Headache  . Other     "States all antidepressants cause migraines."    Family History  Problem Relation Age of Onset  . Migraines Father   . Stroke Father   . Heart Problems Mother   . Epilepsy Brother      Prior to Admission medications   Medication Sig Start Date End Date Taking? Authorizing Provider  ALPRAZolam (XANAX) 1 MG tablet TAKE 1/2 TABLET(0.5 MG) BY MOUTH TWICE DAILY AS NEEDED FOR ANXIETY Patient taking differently: Take 0.5 mg by mouth 2 (two) times daily as needed for anxiety.  02/17/20  Yes Martinique, [name], MD  gabapentin (NEURONTIN) 300 MG capsule TAKE 1 CAPSULE BY MOUTH AT BEDTIME Patient taking differently: Take 300 mg  by mouth at bedtime.  02/15/20  Yes Martinique, [name], MD  lansoprazole (PREVACID) 15 MG capsule 1 tab at 7:00 am (30 min before breakfast). Patient taking differently: Take 15 mg by mouth daily before breakfast.  06/16/17  Yes Martinique, [name], MD  Magnesium 250 MG TABS Take 250 mg by mouth daily. 2 PO DAILY   Yes [provider]  MAXALT-MLT 10 MG disintegrating tablet DISSOLVE ONE TABLET BY MOUTH AS NEEDED HEADACHE Patient taking differently: Take 10 mg by mouth as needed for migraine (Headache.).  05/31/19  Yes Martinique, [name], MD  oxybutynin (DITROPAN XL) 10 MG 24 hr tablet Take 1  tablet (10 mg total) by mouth at bedtime. 12/19/19  Yes Martinique, [name], MD  pregabalin (LYRICA) 50 MG capsule TAKE 1 CAPSULE(50 MG) BY MOUTH THREE TIMES DAILY Patient taking differently: Take 50 mg by mouth 3 (three) times daily.  03/26/20  Yes Pete Pelt, PA-C  Riboflavin 400 MG CAPS Take 1 capsule (400 mg total) by mouth daily. 03/30/15  Yes Kathrynn Ducking, MD  tiZANidine (ZANAFLEX) 4 MG tablet TAKE 1 TABLET(4 MG) BY MOUTH TWICE DAILY AS NEEDED FOR MUSCLE SPASMS Patient taking differently: Take 4 mg by mouth 2 (two) times daily as needed for muscle spasms.  02/15/20  Yes Martinique, [name], MD  tretinoin (RETIN-A) 0.1 % cream APPLY TO AFFECTED AREA EVERY DAY AT BEDTIME Patient taking differently: Apply 1 application topically at bedtime.  09/22/18  Yes Martinique, [name], MD  Turmeric 500 MG CAPS Take 1 capsule by mouth daily.   Yes [provider]  verapamil (VERELAN PM) 180 MG 24 hr capsule TAKE 1 CAPSULE(180 MG) BY MOUTH DAILY Patient taking differently: Take 180 mg by mouth daily.  12/19/19  Yes Martinique, [name], MD  Vitamin D, Ergocalciferol, (DRISDOL) 1.25 MG (50000 UNIT) CAPS capsule 1 cap every 2 weeks. Patient taking differently: Take 50,000 Units by mouth every 14 (fourteen) days.  12/19/19  Yes Martinique, [name], MD  methylPREDNISolone (MEDROL) 4 MG tablet Medrol dose pack. Take as instructed Patient not taking: Reported on 04/04/2020 04/11/19   Mcarthur Rossetti, MD    Physical Exam: Vitals:   04/04/20 1200 04/04/20 1500 04/04/20 1600 04/04/20 1802  BP: (!) 150/66 (!) 153/69 (!) 149/86 (!) 153/69  Pulse: 81 85 82 96  Resp:    18  Temp:      TempSrc:      SpO2: 96% 98% 97% 95%     Vitals:   04/04/20 1200 04/04/20 1500 04/04/20 1600 04/04/20 1802  BP: (!) 150/66 (!) 153/69 (!) 149/86 (!) 153/69  Pulse: 81 85 82 96  Resp:    18  Temp:      TempSrc:      SpO2: 96% 98% 97% 95%      Constitutional: Alert and oriented x 3 .  In mild discomfort related to  pain HEENT:      Head: Normocephalic and atraumatic.         Eyes: PERLA, EOMI, Conjunctivae are normal. Sclera is non-icteric.       Mouth/Throat: Mucous membranes are moist.       Neck: Supple with no signs of meningismus. Cardiovascular: Regular rate and rhythm. No murmurs, gallops, or rubs. 2+ symmetrical distal pulses are present . No JVD. No LE edema Respiratory: Respiratory effort normal .Lungs sounds clear bilaterally. No wheezes, crackles, or rhonchi.  Gastrointestinal: Soft, non tender, and non distended with positive bowel sounds. No  rebound or guarding. Genitourinary: No CVA tenderness. Musculoskeletal:  TLSO brace on.  Some tenderness on palpation over left hip. No edema, cyanosis, or erythema of extremities. Neurologic: Normal speech and language. Face is symmetric. Moving all extremities. No gross focal neurologic deficits . Skin: Skin is warm, dry.  No rash or ulcers Psychiatric: Mood and affect are normal Speech and behavior are normal   Labs on Admission: I have personally reviewed following labs and imaging studies  CBC: Recent Labs  Lab 04/04/20 1604  WBC 7.3  NEUTROABS 4.7  HGB 13.7  HCT 41.1  MCV 93.6  PLT 423   Basic Metabolic Panel: Recent Labs  Lab 04/04/20 1604  NA 138  K 4.0  CL 105  CO2 21*  GLUCOSE 104*  BUN 8  CREATININE 0.81  CALCIUM 9.2   GFR: CrCl cannot be calculated (Unknown ideal weight.). Liver Function Tests: No results for input(s): AST, ALT, ALKPHOS, BILITOT, PROT, ALBUMIN in the last 168 hours. No results for input(s): LIPASE, AMYLASE in the last 168 hours. No results for input(s): AMMONIA in the last 168 hours. Coagulation Profile: No results for input(s): INR, PROTIME in the last 168 hours. Cardiac Enzymes: No results for input(s): CKTOTAL, CKMB, CKMBINDEX, TROPONINI in the last 168 hours. BNP (last 3 results) No results for input(s): PROBNP in the last 8760 hours. HbA1C: No results for input(s): HGBA1C in the last 72  hours. CBG: No results for input(s): GLUCAP in the last 168 hours. Lipid Profile: No results for input(s): CHOL, HDL, LDLCALC, TRIG, CHOLHDL, LDLDIRECT in the last 72 hours. Thyroid Function Tests: No results for input(s): TSH, T4TOTAL, FREET4, T3FREE, THYROIDAB in the last 72 hours. Anemia Panel: No results for input(s): VITAMINB12, FOLATE, FERRITIN, TIBC, IRON, RETICCTPCT in the last 72 hours. Urine analysis:    Component Value Date/Time   BILIRUBINUR neg 06/10/2017 1728   PROTEINUR neg 06/10/2017 1728   UROBILINOGEN 1.0 06/10/2017 1728   NITRITE neg 06/10/2017 1728   LEUKOCYTESUR Negative 06/10/2017 1728    Radiological Exams on Admission: DG Lumbar Spine Complete  Result Date: 04/04/2020 CLINICAL DATA:  Low back pain while bending, initial encounter EXAM: LUMBAR SPINE - COMPLETE 4+ VIEW COMPARISON:  07/28/2018 FINDINGS: Five lumbar type vertebral bodies are well visualized. L1 compression deformity is noted which is new from the prior exam but appears chronic in nature. Correlation to point tenderness is recommended. Mild degenerative anterolisthesis of L4 on L5 is noted stable from prior exam. Mild facet hypertrophic changes are noted. Ingested tablets are noted in the right abdomen. No soft tissue abnormality is seen. IMPRESSION: L1 compression deformity which appears chronic but new from a prior exam of 2019. Correlation to point tenderness is recommended. Degenerative change with anterolisthesis of L4 on L5. Electronically Signed   By: Inez Catalina M.D.   On: 04/04/2020 10:23   CT Lumbar Spine Wo Contrast  Result Date: 04/04/2020 CLINICAL DATA:  Low back pain for 3 days. EXAM: CT LUMBAR SPINE WITHOUT CONTRAST TECHNIQUE: Multidetector CT imaging of the lumbar spine was performed without intravenous contrast administration. Multiplanar CT image reconstructions were also generated. COMPARISON:  Lumbar spine radiographs 04/04/2020 and MRI 09/09/2018 FINDINGS: Segmentation: 5 lumbar type  vertebrae. Alignment: Mild thoracolumbar levoscoliosis. Grade 1 anterolisthesis of L4 on L5, unchanged from the prior MRI. Vertebrae: L1 superior endplate compression fracture with 30% vertebral body height loss and 4 mm retropulsion of the superior endplate. A fracture line remains visible although there is surrounding sclerosis. Hemangioma in the L4 vertebral  body. Paraspinal and other soft tissues: Aortic atherosclerosis without aneurysm. Disc levels: Disc bulging and posterior element hypertrophy result in chronic mild-to-moderate spinal stenosis at L3-4 and moderate to severe spinal stenosis at L4-5. No significant spinal stenosis related to mild L1 retropulsion. IMPRESSION: 1. Possibly subacute L1 compression fracture with 30% height loss and 4 mm retropulsion. No associated spinal stenosis. 2. Chronic mild-to-moderate spinal stenosis at L3-4 and moderate to severe spinal stenosis at L4-5. 3. Aortic Atherosclerosis (ICD10-I70.0). Electronically Signed   By: Logan Bores M.D.   On: 04/04/2020 17:39   DG HIP UNILAT WITH PELVIS 2-3 VIEWS LEFT  Result Date: 04/04/2020 CLINICAL DATA:  Left hip pain following bending, initial encounter EXAM: DG HIP (WITH OR WITHOUT PELVIS) 2V LEFT COMPARISON:  None. FINDINGS: No acute fracture or dislocation is seen. No significant degenerative changes are noted. No soft tissue abnormality is seen. IMPRESSION: No acute abnormality noted. Electronically Signed   By: Inez Catalina M.D.   On: 04/04/2020 10:24   DG HIP UNILAT WITH PELVIS 2-3 VIEWS RIGHT  Result Date: 04/04/2020 CLINICAL DATA:  Right hip pain following bending, initial encounter EXAM: DG HIP (WITH OR WITHOUT PELVIS) 3V RIGHT COMPARISON:  None. FINDINGS: Pelvic ring is intact. No acute fracture or dislocation is noted. No significant degenerative changes are seen. No soft tissue abnormality is noted. IMPRESSION: No acute abnormality noted. Electronically Signed   By: Inez Catalina M.D.   On: 04/04/2020 10:26     EKG: Independently reviewed.   Assessment/Plan Principal Problem:   Intractable low back pain   Subacute closed wedge compression fracture of L1 vertebra (HCC) with 4 mm retropulsion without spinal stenosis   Ambulatory dysfunction   Chronic low back pain, moderate to severe spinal stenosis -Patient presents with a 2 to 3-day history of worsening low back pain impairing her ability to ambulate but without bowel or bladder problems or saddle anesthesia.  CT showing subacute L1 vertebral fracture with 4 mm retropulsion without associated spinal stenosis.  30% height loss -Physical exam NOT consistent with spinal cord compression -Brace ordered from the emergency room -emergency room provider spoke with neurosurgery who recommended brace and follow-up in 1 week with Dr. Ronnald Ramp  -Case management consult as patient would likely need a higher level of care -Pain control    Depression/anxiety -Continue alprazolam    Essential hypertension -Continue verapamil   L1 vertebral fracture (HCC)    DVT prophylaxis: Lovenox  Code Status: Patient elects to be DNR Family Communication: Son at bedside [facility] Disposition Plan: Back to previous home environment Consults called: none.  Follow-up neurosurgery, Dr. Ronnald Ramp in 1 week Status:obs    Athena Masse MD [facility]     04/04/2020, 6:39 PM

## 2020-04-04 NOTE — Progress Notes (Signed)
Orthopedic Tech Progress Note Patient Details:  Tracey Armstrong 1935/05/10 [id number] TLSO Brace has been ordered  Patient ID: Tracey Armstrong, female   DOB: September 24, 1935, 85 y.o.   MRN: [id number]   Tracey Armstrong 04/04/2020, 6:18 PM

## 2020-04-04 NOTE — ED Notes (Signed)
Assisted pt with calling her other son.

## 2020-04-04 NOTE — ED Notes (Signed)
Pt son at bedside.

## 2020-04-04 NOTE — ED Notes (Signed)
Patient transported to X-ray 

## 2020-04-04 NOTE — ED Notes (Signed)
Pt noted to be moving arms.

## 2020-04-04 NOTE — ED Provider Notes (Signed)
[facility] EMERGENCY DEPARTMENT Provider Note   CSN: [id number] Arrival date & time: 04/04/20  0802     History Chief Complaint  Patient presents with  . Back Pain    Tracey Armstrong is a 85 y.o. female.  Patient sent in from [facility] facility.  Bed patient is on independent living side.  Patient with increasing low back pain bilateral hip pain for the past 3 days.  No recent fall.  Patient with a history of chronic back pain and chronic bilateral knee pain problems and hip pain problems according to her orthopedic notes.  Patient not on any pain medicine at this time.  Patient denies any numbness or weakness to her her feet.  However she has not been able to walk very well for the past 2days due to the back pain.        Past Medical History:  Diagnosis Date  . Anxiety   . Arthritis   . Chronic insomnia 03/30/2015  . Chronic low back pain 12/02/2016  . Depression   . GERD (gastroesophageal reflux disease)   . Hiatal hernia   . High cholesterol   . Hypertelorism   . Hypertension   . Meningioma ([place])   . Migraine     Patient Active Problem List   Diagnosis Date Noted  . Urine, incontinence, stress female 02/07/2019  . Knee osteoarthritis 02/01/2018  . Iron deficiency anemia 10/28/2017  . Rhinitis, allergic 07/27/2017  . GERD (gastroesophageal reflux disease) 06/16/2017  . Chronic pain disorder 06/16/2017  . Vitamin D deficiency 05/25/2017  . Hyperlipidemia 05/25/2017  . 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 Pain 02/26/2017  . Chronic low back pain 12/02/2016  . Abdominal pain 03/26/2016  . 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 Peptic esophagitis 03/26/2016  . Neuralgia neuritis, sciatic nerve 03/26/2016  . 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[place]) 06/20/2015  . Chronic insomnia 03/30/2015  . Chronic migraine 01/10/2015  . Depression 04/20/2014  . Generalized anxiety disorder 04/20/2014  . Chronic back pain 04/20/2014  . Chronic daily headache 04/20/2014  .  Essential hypertension 04/20/2014  . Migraine headache 04/19/2014    Past Surgical History:  Procedure Laterality Date  . STOMACH SURGERY       OB History   No obstetric history on file.     Family History  Problem Relation Age of Onset  . Migraines Father   . Stroke Father   . Heart Problems Mother   . Epilepsy Brother     Social History   Tobacco Use  . Smoking status: Never Smoker  . Smokeless tobacco: Never Used  Vaping Use  . Vaping Use: Never used  Substance Use Topics  . Alcohol use: No  . Drug use: No    Home Medications Prior to Admission medications   Medication Sig Start Date End Date Taking? Authorizing Provider  ALPRAZolam (XANAX) 1 MG tablet TAKE 1/2 TABLET(0.5 MG) BY MOUTH TWICE DAILY AS NEEDED FOR ANXIETY Patient taking differently: Take 0.5 mg by mouth 2 (two) times daily as needed for anxiety.  02/17/20  Yes Martinique, [name], MD  gabapentin (NEURONTIN) 300 MG capsule TAKE 1 CAPSULE BY MOUTH AT BEDTIME Patient taking differently: Take 300 mg by mouth at bedtime.  02/15/20  Yes Martinique, [name], MD  lansoprazole (PREVACID) 15 MG capsule 1 tab at 7:00 am (30 min before breakfast). Patient taking differently: Take 15 mg by mouth daily before breakfast.  06/16/17  Yes Martinique, [name], MD  Magnesium 250 MG TABS  Take 250 mg by mouth daily. 2 PO DAILY   Yes [provider]  MAXALT-MLT 10 MG disintegrating tablet DISSOLVE ONE TABLET BY MOUTH AS NEEDED HEADACHE Patient taking differently: Take 10 mg by mouth as needed for migraine (Headache.).  05/31/19  Yes Martinique, [name], MD  oxybutynin (DITROPAN XL) 10 MG 24 hr tablet Take 1 tablet (10 mg total) by mouth at bedtime. 12/19/19  Yes Martinique, [name], MD  pregabalin (LYRICA) 50 MG capsule TAKE 1 CAPSULE(50 MG) BY MOUTH THREE TIMES DAILY Patient taking differently: Take 50 mg by mouth 3 (three) times daily.  03/26/20  Yes Pete Pelt, PA-C  Riboflavin 400 MG CAPS Take 1 capsule (400 mg total) by mouth daily.  03/30/15  Yes Kathrynn Ducking, MD  tiZANidine (ZANAFLEX) 4 MG tablet TAKE 1 TABLET(4 MG) BY MOUTH TWICE DAILY AS NEEDED FOR MUSCLE SPASMS Patient taking differently: Take 4 mg by mouth 2 (two) times daily as needed for muscle spasms.  02/15/20  Yes Martinique, [name], MD  tretinoin (RETIN-A) 0.1 % cream APPLY TO AFFECTED AREA EVERY DAY AT BEDTIME Patient taking differently: Apply 1 application topically at bedtime.  09/22/18  Yes Martinique, [name], MD  Turmeric 500 MG CAPS Take 1 capsule by mouth daily.   Yes [provider]  verapamil (VERELAN PM) 180 MG 24 hr capsule TAKE 1 CAPSULE(180 MG) BY MOUTH DAILY Patient taking differently: Take 180 mg by mouth daily.  12/19/19  Yes Martinique, [name], MD  Vitamin D, Ergocalciferol, (DRISDOL) 1.25 MG (50000 UNIT) CAPS capsule 1 cap every 2 weeks. Patient taking differently: Take 50,000 Units by mouth every 14 (fourteen) days.  12/19/19  Yes Martinique, [name], MD  methylPREDNISolone (MEDROL) 4 MG tablet Medrol dose pack. Take as instructed Patient not taking: Reported on 04/04/2020 04/11/19   Mcarthur Rossetti, MD    Allergies    Dihydrotachysterol, Trazodone and nefazodone, Valium [diazepam], Citalopram, and Other  Review of Systems   Review of Systems  Constitutional: Negative for chills and fever.  HENT: Negative for rhinorrhea and sore throat.   Eyes: Negative for visual disturbance.  Respiratory: Negative for cough and shortness of breath.   Cardiovascular: Negative for chest pain and leg swelling.  Gastrointestinal: Negative for abdominal pain, diarrhea, nausea and vomiting.  Genitourinary: Negative for dysuria.  Musculoskeletal: Positive for back pain. Negative for neck pain.  Skin: Negative for rash.  Neurological: Negative for dizziness, light-headedness and headaches.  Hematological: Does not bruise/bleed easily.  Psychiatric/Behavioral: Negative for confusion.    Physical Exam Updated Vital Signs BP (!) 160/82 (BP Location: Right  Arm)   Pulse 82   Temp 98.6 F (37 C) (Oral)   Resp 18   SpO2 100%   Physical Exam Vitals and nursing note reviewed.  Constitutional:      General: She is not in acute distress.    Appearance: Normal appearance. She is well-developed.  HENT:     Head: Normocephalic and atraumatic.  Eyes:     Conjunctiva/sclera: Conjunctivae normal.     Pupils: Pupils are equal, round, and reactive to light.  Cardiovascular:     Rate and Rhythm: Normal rate and regular rhythm.     Heart sounds: No murmur heard.   Pulmonary:     Effort: Pulmonary effort is normal. No respiratory distress.     Breath sounds: Normal breath sounds.  Abdominal:     Palpations: Abdomen is soft.     Tenderness: There is no abdominal  tenderness.  Musculoskeletal:        General: No swelling. Normal range of motion.     Cervical back: Normal range of motion and neck supple.     Comments: Dorsalis pedis pulse both feet 1-2+.  No lumbar point tenderness.  Skin:    General: Skin is warm and dry.     Capillary Refill: Capillary refill takes less than 2 seconds.  Neurological:     Mental Status: She is alert and oriented to person, place, and time.     Cranial Nerves: No cranial nerve deficit.     Sensory: No sensory deficit.     Motor: No weakness.     ED Results / Procedures / Treatments   Labs (all labs ordered are listed, but only abnormal results are displayed) Labs Reviewed - No data to display  EKG None  Radiology DG Lumbar Spine Complete  Result Date: 04/04/2020 CLINICAL DATA:  Low back pain while bending, initial encounter EXAM: LUMBAR SPINE - COMPLETE 4+ VIEW COMPARISON:  07/28/2018 FINDINGS: Five lumbar type vertebral bodies are well visualized. L1 compression deformity is noted which is new from the prior exam but appears chronic in nature. Correlation to point tenderness is recommended. Mild degenerative anterolisthesis of L4 on L5 is noted stable from prior exam. Mild facet hypertrophic changes are  noted. Ingested tablets are noted in the right abdomen. No soft tissue abnormality is seen. IMPRESSION: L1 compression deformity which appears chronic but new from a prior exam of 2019. Correlation to point tenderness is recommended. Degenerative change with anterolisthesis of L4 on L5. Electronically Signed   By: Inez Catalina M.D.   On: 04/04/2020 10:23   DG HIP UNILAT WITH PELVIS 2-3 VIEWS LEFT  Result Date: 04/04/2020 CLINICAL DATA:  Left hip pain following bending, initial encounter EXAM: DG HIP (WITH OR WITHOUT PELVIS) 2V LEFT COMPARISON:  None. FINDINGS: No acute fracture or dislocation is seen. No significant degenerative changes are noted. No soft tissue abnormality is seen. IMPRESSION: No acute abnormality noted. Electronically Signed   By: Inez Catalina M.D.   On: 04/04/2020 10:24   DG HIP UNILAT WITH PELVIS 2-3 VIEWS RIGHT  Result Date: 04/04/2020 CLINICAL DATA:  Right hip pain following bending, initial encounter EXAM: DG HIP (WITH OR WITHOUT PELVIS) 3V RIGHT COMPARISON:  None. FINDINGS: Pelvic ring is intact. No acute fracture or dislocation is noted. No significant degenerative changes are seen. No soft tissue abnormality is noted. IMPRESSION: No acute abnormality noted. Electronically Signed   By: Inez Catalina M.D.   On: 04/04/2020 10:26    Procedures Procedures (including critical care time)  Medications Ordered in ED Medications  ondansetron (ZOFRAN) injection 4 mg (has no administration in time range)  HYDROmorphone (DILAUDID) injection 0.5 mg (has no administration in time range)    ED Course  I have reviewed the triage vital signs and the nursing notes.  Pertinent labs & imaging results that were available during my care of the patient were reviewed by me and considered in my medical decision making (see chart for details).    MDM Rules/Calculators/A&P                          X-rays of the lumbar back show a chronic L1 compression fracture.  X-rays of both hips and  pelvis without any acute abnormalities.  Have ordered pain medication for her IV to see if we can improve her pain and then will try to  move her and ambulate her.  Patient certainly has a history of chronic low back pain.  This seems to be an exacerbation of that.  There is no evidence of any neuro deficits to her bilateral lower extremity.  Dorsalis pedis pulses 1-2+ in both lower extremities.  Patient received 3 doses hydromorphone. Stated no real improvement with her back pain.  Had a conversation with her son. He really wanted to see if [facility] would be able to move her to a higher level of care. Other options where is moving towards rehab placement either through it admission or waiting here with social worker and PT consults.  Patient's son is over meeting with [facility] he will get back to [place]. Evening physician will go ahead and order CT lumbar. Just for further evaluation but on plain films the compression fracture appeared to be old. If it were to be more acute that could help with the mission if needed.     Final Clinical Impression(s) / ED Diagnoses Final diagnoses:  Chronic midline low back pain without sciatica  Bilateral hip pain    Rx / DC Orders ED Discharge Orders    None       Fredia Sorrow, MD 04/04/20 1544

## 2020-04-04 NOTE — ED Notes (Signed)
Patient returned from [facility]. CM at bedside.

## 2020-04-04 NOTE — ED Triage Notes (Signed)
Pt. Has lower back pain for  3 days, comes from [facility].  No known injury.  EMS reports no urinary symptoms. Mo medications taken for the pain, last meal was last night.

## 2020-04-05 DIAGNOSIS — M48061 Spinal stenosis, lumbar region without neurogenic claudication: Secondary | ICD-10-CM | POA: Diagnosis not present

## 2020-04-05 DIAGNOSIS — M25552 Pain in left hip: Secondary | ICD-10-CM | POA: Diagnosis not present

## 2020-04-05 DIAGNOSIS — M4316 Spondylolisthesis, lumbar region: Secondary | ICD-10-CM | POA: Diagnosis present

## 2020-04-05 DIAGNOSIS — M4856XA Collapsed vertebra, not elsewhere classified, lumbar region, initial encounter for fracture: Secondary | ICD-10-CM | POA: Diagnosis present

## 2020-04-05 DIAGNOSIS — K5900 Constipation, unspecified: Secondary | ICD-10-CM | POA: Diagnosis not present

## 2020-04-05 DIAGNOSIS — G2581 Restless legs syndrome: Secondary | ICD-10-CM | POA: Diagnosis not present

## 2020-04-05 DIAGNOSIS — M25551 Pain in right hip: Secondary | ICD-10-CM | POA: Diagnosis not present

## 2020-04-05 DIAGNOSIS — I10 Essential (primary) hypertension: Secondary | ICD-10-CM | POA: Diagnosis not present

## 2020-04-05 DIAGNOSIS — F419 Anxiety disorder, unspecified: Secondary | ICD-10-CM | POA: Diagnosis not present

## 2020-04-05 DIAGNOSIS — M545 Low back pain: Secondary | ICD-10-CM | POA: Diagnosis not present

## 2020-04-05 DIAGNOSIS — R262 Difficulty in walking, not elsewhere classified: Secondary | ICD-10-CM

## 2020-04-05 DIAGNOSIS — G8929 Other chronic pain: Secondary | ICD-10-CM

## 2020-04-05 DIAGNOSIS — S32019D Unspecified fracture of first lumbar vertebra, subsequent encounter for fracture with routine healing: Secondary | ICD-10-CM | POA: Diagnosis not present

## 2020-04-05 DIAGNOSIS — E860 Dehydration: Secondary | ICD-10-CM | POA: Diagnosis present

## 2020-04-05 DIAGNOSIS — I959 Hypotension, unspecified: Secondary | ICD-10-CM | POA: Diagnosis present

## 2020-04-05 DIAGNOSIS — S32010D Wedge compression fracture of first lumbar vertebra, subsequent encounter for fracture with routine healing: Secondary | ICD-10-CM | POA: Diagnosis not present

## 2020-04-05 DIAGNOSIS — R278 Other lack of coordination: Secondary | ICD-10-CM | POA: Diagnosis not present

## 2020-04-05 DIAGNOSIS — Z20822 Contact with and (suspected) exposure to covid-19: Secondary | ICD-10-CM | POA: Diagnosis present

## 2020-04-05 DIAGNOSIS — R279 Unspecified lack of coordination: Secondary | ICD-10-CM | POA: Diagnosis not present

## 2020-04-05 DIAGNOSIS — M7138 Other bursal cyst, other site: Secondary | ICD-10-CM | POA: Insufficient documentation

## 2020-04-05 DIAGNOSIS — K219 Gastro-esophageal reflux disease without esophagitis: Secondary | ICD-10-CM | POA: Diagnosis not present

## 2020-04-05 DIAGNOSIS — N320 Bladder-neck obstruction: Secondary | ICD-10-CM | POA: Diagnosis present

## 2020-04-05 DIAGNOSIS — Z743 Need for continuous supervision: Secondary | ICD-10-CM | POA: Diagnosis not present

## 2020-04-05 DIAGNOSIS — Z66 Do not resuscitate: Secondary | ICD-10-CM | POA: Diagnosis present

## 2020-04-05 DIAGNOSIS — F329 Major depressive disorder, single episode, unspecified: Secondary | ICD-10-CM | POA: Diagnosis not present

## 2020-04-05 DIAGNOSIS — Z79899 Other long term (current) drug therapy: Secondary | ICD-10-CM | POA: Diagnosis not present

## 2020-04-05 DIAGNOSIS — F29 Unspecified psychosis not due to a substance or known physiological condition: Secondary | ICD-10-CM | POA: Diagnosis not present

## 2020-04-05 DIAGNOSIS — M48062 Spinal stenosis, lumbar region with neurogenic claudication: Secondary | ICD-10-CM | POA: Insufficient documentation

## 2020-04-05 DIAGNOSIS — E7800 Pure hypercholesterolemia, unspecified: Secondary | ICD-10-CM | POA: Diagnosis present

## 2020-04-05 DIAGNOSIS — M6281 Muscle weakness (generalized): Secondary | ICD-10-CM | POA: Diagnosis not present

## 2020-04-05 DIAGNOSIS — R2681 Unsteadiness on feet: Secondary | ICD-10-CM | POA: Diagnosis not present

## 2020-04-05 DIAGNOSIS — G43909 Migraine, unspecified, not intractable, without status migrainosus: Secondary | ICD-10-CM | POA: Diagnosis not present

## 2020-04-05 DIAGNOSIS — R5381 Other malaise: Secondary | ICD-10-CM | POA: Diagnosis not present

## 2020-04-05 DIAGNOSIS — N3289 Other specified disorders of bladder: Secondary | ICD-10-CM | POA: Diagnosis not present

## 2020-04-05 LAB — MAGNESIUM: Magnesium: 1.9 mg/dL (ref 1.7–2.4)

## 2020-04-05 MED ORDER — OXYCODONE HCL 5 MG PO TABS
5.0000 mg | ORAL_TABLET | ORAL | Status: DC | PRN
  Administered 2020-04-05 – 2020-04-13 (×22): 5 mg via ORAL
  Filled 2020-04-05 (×23): qty 1

## 2020-04-05 MED ORDER — MORPHINE SULFATE (PF) 2 MG/ML IV SOLN
2.0000 mg | INTRAVENOUS | Status: DC | PRN
  Administered 2020-04-05: 2 mg via INTRAVENOUS
  Filled 2020-04-05: qty 1

## 2020-04-05 MED ORDER — LACTATED RINGERS IV SOLN: INTRAVENOUS | Status: DC

## 2020-04-05 MED ORDER — LACTATED RINGERS IV BOLUS
1000.0000 mL | Freq: Once | INTRAVENOUS | Status: AC
  Administered 2020-04-05: 1000 mL via INTRAVENOUS

## 2020-04-05 NOTE — Progress Notes (Signed)
0430: Patient has not voided since being admitted to floor. Bladder scan performed and 775mls resulted. On call provider notified and order for in and out cath received. In and out cath performed and 1039mls output. Bladder scan post cath showed 47mls.

## 2020-04-05 NOTE — Procedures (Signed)
Lumbosacral Transforaminal Epidural Steroid Injection - Sub-Pedicular Approach with Fluoroscopic Guidance  Patient: Tracey Armstrong      Date of Birth: Jan 06, 1935 MRN: [id number] PCP: Martinique, [name], MD      Visit Date: 10/12/2019   Universal Protocol:    Date/Time: 10/12/2019  Consent Given By: the patient  Position: PRONE  Additional Comments: Vital signs were monitored before and after the procedure. Patient was prepped and draped in the usual sterile fashion. The correct patient, procedure, and site was verified.   Injection Procedure Details:  Procedure Site One Meds Administered:  Meds ordered this encounter  Medications  . methylPREDNISolone acetate (DEPO-MEDROL) injection 40 mg    Laterality: Left  Location/Site:  L5-S1  Needle size: 22 G  Needle type: Spinal  Needle Placement: Transforaminal  Findings:    -Comments: Excellent flow of contrast along the nerve, nerve root and into the epidural space.  Procedure Details: After squaring off the end-plates to get a true AP view, the C-arm was positioned so that an oblique view of the foramen as noted above was visualized. The target area is just inferior to the "nose of the scotty dog" or sub pedicular. The soft tissues overlying this structure were infiltrated with 2-3 ml. of 1% Lidocaine without Epinephrine.  The spinal needle was inserted toward the target using a "trajectory" view along the fluoroscope beam.  Under AP and lateral visualization, the needle was advanced so it did not puncture dura and was located close the 6 O'Clock position of the pedical in AP tracterory. Biplanar projections were used to confirm position. Aspiration was confirmed to be negative for CSF and/or blood. A 1-2 ml. volume of Isovue-250 was injected and flow of contrast was noted at each level. Radiographs were obtained for documentation purposes.   After attaining the desired flow of contrast documented above, a 0.5 to 1.0 ml test dose  of 0.25% Marcaine was injected into each respective transforaminal space.  The patient was observed for 90 seconds post injection.  After no sensory deficits were reported, and normal lower extremity motor function was noted,   the above injectate was administered so that equal amounts of the injectate were placed at each foramen (level) into the transforaminal epidural space.   Additional Comments:  The patient tolerated the procedure well Dressing: 2 x 2 sterile gauze and Band-Aid    Post-procedure details: Patient was observed during the procedure. Post-procedure instructions were reviewed.  Patient left the clinic in stable condition.

## 2020-04-05 NOTE — Evaluation (Signed)
Physical Therapy Evaluation Patient Details Name: Tracey Armstrong MRN: [id number] DOB: 11/07/1934 Today's Date: 04/05/2020   History of Present Illness  Pt is an 85 y/o female admitted secondary to worsening back pain. Pt found to have subacute L1 compression fx. Being managed with TLSO. PMH includes HTN and chronic back pain.   Clinical Impression  Pt admitted secondary to problem above with deficits below. Pt requiring total A to roll from side to side this session secondary to pain. Unable to tolerate chair position in bed secondary to pain. TLSO on at all times. Feel pt will require increased level of care at d/c and SNF level rehab to increase independence and safety. Pt reports they have higher level of care at [facility]. Will continue to follow acutely to maximize functional mobility independence and safety.     Follow Up Recommendations SNF;Supervision/Assistance - 24 hour    Equipment Recommendations  Other (comment) (TBD at next venue)    Recommendations for Other Services       Precautions / Restrictions Precautions Precautions: Back;Fall Precaution Booklet Issued: No Precaution Comments: Verbally reviewed back precautions.  Required Braces or Orthoses: Spinal Brace Spinal Brace: Thoracolumbosacral orthotic Restrictions Weight Bearing Restrictions: No      Mobility  Bed Mobility Overal bed mobility: Needs Assistance Bed Mobility: Rolling Rolling: Total assist         General bed mobility comments: Total A to roll from side to side. Pt with poor tolerance for mobility secondary to pain. Attempted to put bed in chair position, however, pt unable to tolerate HOB elevation.   Transfers                 General transfer comment: deferred.   Ambulation/Gait                Stairs            Wheelchair Mobility    Modified Rankin (Stroke Patients Only)       Balance                                             Pertinent  Vitals/Pain Pain Assessment: Faces Faces Pain Scale: Hurts whole lot Pain Location: back  Pain Descriptors / Indicators: Aching;Guarding;Grimacing Pain Intervention(s): Repositioned;Monitored during session;Limited activity within patient's tolerance    Home Living Family/patient expects to be discharged to:: Private residence Living Arrangements: Alone   Type of Home: Independent living facility Home Access: Elevator     Home Layout: One level Home Equipment: None Additional Comments: At [facility] ILF     Prior Function Level of Independence: Independent               Hand Dominance        Extremity/Trunk Assessment   Upper Extremity Assessment Upper Extremity Assessment: Generalized weakness    Lower Extremity Assessment Lower Extremity Assessment: Generalized weakness;RLE deficits/detail;LLE deficits/detail RLE Deficits / Details: Reports increased pain in hip and thigh area LLE Deficits / Details: Reports pain in hip and thigh area. Worse on the L side.     Cervical / Trunk Assessment Cervical / Trunk Assessment: Other exceptions Cervical / Trunk Exceptions: L1 compression fx.   Communication   Communication: No difficulties  Cognition Arousal/Alertness: Awake/alert Behavior During Therapy: WFL for tasks assessed/performed Overall Cognitive Status: Within Functional Limits for tasks assessed  General Comments      Exercises     Assessment/Plan    PT Assessment Patient needs continued PT services  PT Problem List Decreased strength;Decreased balance;Decreased mobility;Decreased activity tolerance;Decreased knowledge of use of DME;Decreased knowledge of precautions;Pain       PT Treatment Interventions DME instruction;Gait training;Functional mobility training;Therapeutic activities;Therapeutic exercise;Balance training;Patient/family education    PT Goals (Current goals can be found in the  Care Plan section)  Acute Rehab PT Goals Patient Stated Goal: to decrease pain  PT Goal Formulation: With patient Time For Goal Achievement: 04/19/20 Potential to Achieve Goals: Good    Frequency Min 2X/week   Barriers to discharge Decreased caregiver support      Co-evaluation               AM-PAC PT "6 Clicks" Mobility  Outcome Measure Help needed turning from your back to your side while in a flat bed without using bedrails?: Total Help needed moving from lying on your back to sitting on the side of a flat bed without using bedrails?: Total Help needed moving to and from a bed to a chair (including a wheelchair)?: Total Help needed standing up from a chair using your arms (e.g., wheelchair or bedside chair)?: Total Help needed to walk in hospital room?: Total Help needed climbing 3-5 steps with a railing? : Total 6 Click Score: 6    End of Session   Activity Tolerance: Patient limited by pain Patient left: in bed;with call bell/phone within reach Nurse Communication: Mobility status PT Visit Diagnosis: Unsteadiness on feet (R26.81);Muscle weakness (generalized) (M62.81);Difficulty in walking, not elsewhere classified (R26.2)    Time: 3220-2542 PT Time Calculation (min) (ACUTE ONLY): 18 min   Charges:   PT Evaluation $PT Eval Moderate Complexity: 1 Mod          Reuel Derby, PT, DPT  Acute Rehabilitation Services  Pager: [phone] Office: [phone]   Rudean Hitt 04/05/2020, 3:26 PM

## 2020-04-05 NOTE — Progress Notes (Signed)
PROGRESS NOTE    Tracey Armstrong  VFI:[id number] DOB: June 01, 1935 DOA: 04/04/2020 PCP: [place], [name], MD    Brief Narrative:  85 y.o. female with medical history significant for Hypertension, depression and chronic low back pain who presents to the emergency room with a 3-day history of worsening lower back pain and bilateral hip pain to the point where she is unable to get out of bed and ambulate at her baseline.  She denies recent fall or injury.  The pain is getting increasingly worse and is affecting her ability to manage independently as per her baseline.  The pain is aching and of moderate to severe intensity and does not radiate.  She denies bowel or bladder difficulties and denies numbness in the groin and inner thigh area. ED Course: On arrival to the emergency room vitals were within normal limits.  Her blood work was not remarkable.  She initially had an x-ray of the lumbar spine and pelvis showed an L1 compression fracture likely chronic.  Patient was treated with IV narcotics but continued to have intractable pain.  She subsequently had a CTA of the LS spine that showed possibly subacute L1 compression fracture with 30% height loss and 4 mm retropulsion.  No associated spinal stenosis.  So showed chronic mild to moderate spinal stenosis L3-4 and moderate to severe L4-5.  Neurosurgery was consulted from the emergency room.  Recommendations pending at the time of admission.  TLSO brace ordered.  Admission requested due to unsafe discharge to current level of care at her facility wellness for pain management..  Assessment & Plan:   Principal Problem:   Intractable low back pain Active Problems:   Depression   Essential hypertension   Chronic low back pain   Closed wedge compression fracture of L1 vertebra (HCC)   Ambulatory dysfunction   L1 vertebral fracture (HCC)   Dehydration  Principal Problem:   Intractable low back pain   Subacute closed wedge compression fracture of L1  vertebra (HCC) with 4 mm retropulsion without spinal stenosis   Ambulatory dysfunction   Chronic low back pain, moderate to severe spinal stenosis -Patient presents with a 2 to 3-day history of worsening low back pain impairing her ability to ambulate but without bowel or bladder problems or saddle anesthesia.   -CT on presentation showing subacute L1 vertebral fracture with 4 mm retropulsion without associated spinal stenosis.  30% height loss -Brace ordered from the emergency room -emergency room provider spoke with neurosurgery who recommended brace and follow-up in 1 week with Dr. Ronnald Ramp  -On multiple PO as well as IV narcotics overnight -NCCSR reviewed. No recent outpatient opioids have been prescribed -Will give trial of oxycodone q4hrs. May add oxycontin if pain suboptimally controlled    Depression/anxiety -Continue alprazolam    Essential hypertension -Continue verapamil as tolerated   L1 vertebral fracture (HCC)  Dehydration -Pt hypotensive this AM, likely made worse by multiple narcotics given in past several hrs -Mucus membranes are dry on exam and pt complaining of increased thirst -Have given pt 1L IVF bolus and will continue basal IVF -Recheck bmet in AM  Bladder outlet obstruction - Over 700cc noted on bladder US with nearly 1L out via I/O cath -Pt currently on oxybutinin. Will discontinue -Cont q6 bladder US and I/O cath if >300cc on scan.   DVT prophylaxis: Lovenox subq Code Status: DNR Family Communication: Pt in room, family not at bedside  Status is: Observation  The patient will require care spanning > 2 midnights  and should be moved to inpatient because: Ongoing active pain requiring inpatient pain management, Unsafe d/c plan and IV treatments appropriate due to intensity of illness or inability to take PO  Dispo: The patient is from: Home              Anticipated d/c is to: disposition to be determined per PT eval              Anticipated d/c date is: 2  days              Patient currently is not medically stable to d/c.       Consultants:   Neurosurgery  Procedures:     Antimicrobials: Anti-infectives (From admission, onward)   None       Subjective: Complaining of continued back pain this AM. Feels thirsty  Objective: Vitals:   04/05/20 0422 04/05/20 0423 04/05/20 0820 04/05/20 1213  BP:  (!) 138/59 (!) 71/41 111/62  Pulse:  79 (!) 56 60  Resp:  16 16 14   Temp: 98.9 F (37.2 C)  97.7 F (36.5 C) 97.7 F (36.5 C)  TempSrc: Oral  Oral Oral  SpO2:  93% 94% 98%  Weight:      Height:        Intake/Output Summary (Last 24 hours) at 04/05/2020 1426 Last data filed at 04/05/2020 0521 Gross per 24 hour  Intake --  Output 1000 ml  Net -1000 ml   Filed Weights   04/04/20 2018  Weight: 71.8 kg    Examination:  General exam: Appears calm and comfortable  Respiratory system: Clear to auscultation. Respiratory effort normal. Cardiovascular system: S1 & S2 heard, Regular Gastrointestinal system: Abdomen is nondistended, soft and nontender. No organomegaly or masses felt. Normal bowel sounds heard. Central nervous system: Alert and oriented. No focal neurological deficits. Extremities: Symmetric 5 x 5 power. Skin: No rashes, lesions  Psychiatry: Judgement and insight appear normal. Mood & affect appropriate.   Data Reviewed: I have personally reviewed following labs and imaging studies  CBC: Recent Labs  Lab 04/04/20 1604  WBC 7.3  NEUTROABS 4.7  HGB 13.7  HCT 41.1  MCV 93.6  PLT 332   Basic Metabolic Panel: Recent Labs  Lab 04/04/20 1604 04/05/20 0624  NA 138  --   K 4.0  --   CL 105  --   CO2 21*  --   GLUCOSE 104*  --   BUN 8  --   CREATININE 0.81  --   CALCIUM 9.2  --   MG  --  1.9   GFR: Estimated Creatinine Clearance: 48.8 mL/min (by C-G formula based on SCr of 0.81 mg/dL). Liver Function Tests: No results for input(s): AST, ALT, ALKPHOS, BILITOT, PROT, ALBUMIN in the last 168  hours. No results for input(s): LIPASE, AMYLASE in the last 168 hours. No results for input(s): AMMONIA in the last 168 hours. Coagulation Profile: No results for input(s): INR, PROTIME in the last 168 hours. Cardiac Enzymes: No results for input(s): CKTOTAL, CKMB, CKMBINDEX, TROPONINI in the last 168 hours. BNP (last 3 results) No results for input(s): PROBNP in the last 8760 hours. HbA1C: No results for input(s): HGBA1C in the last 72 hours. CBG: No results for input(s): GLUCAP in the last 168 hours. Lipid Profile: No results for input(s): CHOL, HDL, LDLCALC, TRIG, CHOLHDL, LDLDIRECT in the last 72 hours. Thyroid Function Tests: No results for input(s): TSH, T4TOTAL, FREET4, T3FREE, THYROIDAB in the last 72 hours. Anemia Panel: No results  for input(s): VITAMINB12, FOLATE, FERRITIN, TIBC, IRON, RETICCTPCT in the last 72 hours. Sepsis Labs: No results for input(s): PROCALCITON, LATICACIDVEN in the last 168 hours.  Recent Results (from the past 240 hour(s))  SARS Coronavirus 2 by RT PCR (hospital order, performed in [facility] hospital lab) Nasopharyngeal Nasopharyngeal Swab     Status: None   Collection Time: 04/04/20  8:00 PM   Specimen: Nasopharyngeal Swab  Result Value Ref Range Status   SARS Coronavirus 2 NEGATIVE NEGATIVE Final    Comment: (NOTE) SARS-CoV-2 target nucleic acids are NOT DETECTED.  The SARS-CoV-2 RNA is generally detectable in upper and lower respiratory specimens during the acute phase of infection. The lowest concentration of SARS-CoV-2 viral copies this assay can detect is 250 copies / mL. A negative result does not preclude SARS-CoV-2 infection and should not be used as the sole basis for treatment or other patient management decisions.  A negative result may occur with improper specimen collection / handling, submission of specimen other than nasopharyngeal swab, presence of viral mutation(s) within the areas targeted by this assay, and inadequate  number of viral copies (<250 copies / mL). A negative result must be combined with clinical observations, patient history, and epidemiological information.  Fact Sheet for Patients:   StrictlyIdeas.no  Fact Sheet for Healthcare Providers: BankingDealers.co.za  This test is not yet approved or  cleared by the [place] FDA and has been authorized for detection and/or diagnosis of SARS-CoV-2 by FDA under an Emergency Use Authorization (EUA).  This EUA will remain in effect (meaning this test can be used) for the duration of the COVID-19 declaration under Section 564(b)(1) of the Act, 21 U.S.C. section 360bbb-3(b)(1), unless the authorization is terminated or revoked sooner.  Performed at [facility], [place] [address] [place], [place] [address]      Radiology Studies: DG Lumbar Spine Complete  Result Date: 04/04/2020 CLINICAL DATA:  Low back pain while bending, initial encounter EXAM: LUMBAR SPINE - COMPLETE 4+ VIEW COMPARISON:  07/28/2018 FINDINGS: Five lumbar type vertebral bodies are well visualized. L1 compression deformity is noted which is new from the prior exam but appears chronic in nature. Correlation to point tenderness is recommended. Mild degenerative anterolisthesis of L4 on L5 is noted stable from prior exam. Mild facet hypertrophic changes are noted. Ingested tablets are noted in the right abdomen. No soft tissue abnormality is seen. IMPRESSION: L1 compression deformity which appears chronic but new from a prior exam of 2019. Correlation to point tenderness is recommended. Degenerative change with anterolisthesis of L4 on L5. Electronically Signed   By: Inez Catalina M.D.   On: 04/04/2020 10:23   CT Lumbar Spine Wo Contrast  Result Date: 04/04/2020 CLINICAL DATA:  Low back pain for 3 days. EXAM: CT LUMBAR SPINE WITHOUT CONTRAST TECHNIQUE: Multidetector CT imaging of the lumbar spine was performed without intravenous  contrast administration. Multiplanar CT image reconstructions were also generated. COMPARISON:  Lumbar spine radiographs 04/04/2020 and MRI 09/09/2018 FINDINGS: Segmentation: 5 lumbar type vertebrae. Alignment: Mild thoracolumbar levoscoliosis. Grade 1 anterolisthesis of L4 on L5, unchanged from the prior MRI. Vertebrae: L1 superior endplate compression fracture with 30% vertebral body height loss and 4 mm retropulsion of the superior endplate. A fracture line remains visible although there is surrounding sclerosis. Hemangioma in the L4 vertebral body. Paraspinal and other soft tissues: Aortic atherosclerosis without aneurysm. Disc levels: Disc bulging and posterior element hypertrophy result in chronic mild-to-moderate spinal stenosis at L3-4 and moderate to severe spinal stenosis  at L4-5. No significant spinal stenosis related to mild L1 retropulsion. IMPRESSION: 1. Possibly subacute L1 compression fracture with 30% height loss and 4 mm retropulsion. No associated spinal stenosis. 2. Chronic mild-to-moderate spinal stenosis at L3-4 and moderate to severe spinal stenosis at L4-5. 3. Aortic Atherosclerosis (ICD10-I70.0). Electronically Signed   By: Logan Bores M.D.   On: 04/04/2020 17:39   DG HIP UNILAT WITH PELVIS 2-3 VIEWS LEFT  Result Date: 04/04/2020 CLINICAL DATA:  Left hip pain following bending, initial encounter EXAM: DG HIP (WITH OR WITHOUT PELVIS) 2V LEFT COMPARISON:  None. FINDINGS: No acute fracture or dislocation is seen. No significant degenerative changes are noted. No soft tissue abnormality is seen. IMPRESSION: No acute abnormality noted. Electronically Signed   By: Inez Catalina M.D.   On: 04/04/2020 10:24   DG HIP UNILAT WITH PELVIS 2-3 VIEWS RIGHT  Result Date: 04/04/2020 CLINICAL DATA:  Right hip pain following bending, initial encounter EXAM: DG HIP (WITH OR WITHOUT PELVIS) 3V RIGHT COMPARISON:  None. FINDINGS: Pelvic ring is intact. No acute fracture or dislocation is noted. No  significant degenerative changes are seen. No soft tissue abnormality is noted. IMPRESSION: No acute abnormality noted. Electronically Signed   By: Inez Catalina M.D.   On: 04/04/2020 10:26    Scheduled Meds: . enoxaparin (LOVENOX) injection  40 mg Subcutaneous Q24H  . gabapentin  300 mg Oral QHS  . magnesium oxide  200 mg Oral Daily  . oxybutynin  10 mg Oral QHS  . pantoprazole  20 mg Oral Daily  . verapamil  180 mg Oral Daily   Continuous Infusions: . lactated ringers 75 mL/hr at 04/05/20 1115     LOS: 0 days   Marylu Lund, MD [facility] Pager On Amion  If 7PM-7AM, please contact night-coverage 04/05/2020, 2:26 PM

## 2020-04-06 MED ORDER — LACTATED RINGERS IV BOLUS
500.0000 mL | Freq: Once | INTRAVENOUS | Status: AC
  Administered 2020-04-06: 500 mL via INTRAVENOUS

## 2020-04-06 MED ORDER — LABETALOL HCL 5 MG/ML IV SOLN: 5.0000 mg | INTRAVENOUS | Status: DC | PRN

## 2020-04-06 MED ORDER — OXYCODONE HCL ER 10 MG PO T12A
10.0000 mg | EXTENDED_RELEASE_TABLET | Freq: Two times a day (BID) | ORAL | Status: DC
  Administered 2020-04-06 – 2020-04-07 (×2): 10 mg via ORAL
  Filled 2020-04-06 (×2): qty 1

## 2020-04-06 NOTE — Progress Notes (Signed)
PROGRESS NOTE    Tracey Armstrong  UXN:[id number] DOB: 06/15/1935 DOA: 04/04/2020 PCP: [place], [name], MD    Brief Narrative:  85 y.o. female with medical history significant for Hypertension, depression and chronic low back pain who presents to the emergency room with a 3-day history of worsening lower back pain and bilateral hip pain to the point where she is unable to get out of bed and ambulate at her baseline.  She denies recent fall or injury.  The pain is getting increasingly worse and is affecting her ability to manage independently as per her baseline.  The pain is aching and of moderate to severe intensity and does not radiate.  She denies bowel or bladder difficulties and denies numbness in the groin and inner thigh area. ED Course: On arrival to the emergency room vitals were within normal limits.  Her blood work was not remarkable.  She initially had an x-ray of the lumbar spine and pelvis showed an L1 compression fracture likely chronic.  Patient was treated with IV narcotics but continued to have intractable pain.  She subsequently had a CTA of the LS spine that showed possibly subacute L1 compression fracture with 30% height loss and 4 mm retropulsion.  No associated spinal stenosis.  So showed chronic mild to moderate spinal stenosis L3-4 and moderate to severe L4-5.  Neurosurgery was consulted from the emergency room.  Recommendations pending at the time of admission.  TLSO brace ordered.  Admission requested due to unsafe discharge to current level of care at her facility wellness for pain management..  Assessment & Plan:   Principal Problem:   Intractable low back pain Active Problems:   Depression   Essential hypertension   Chronic low back pain   Closed wedge compression fracture of L1 vertebra (HCC)   Ambulatory dysfunction   L1 vertebral fracture (HCC)   Dehydration  Principal Problem:   Intractable low back pain   Subacute closed wedge compression fracture of L1  vertebra (HCC) with 4 mm retropulsion without spinal stenosis   Ambulatory dysfunction   Chronic low back pain, moderate to severe spinal stenosis -Patient presents with a 2 to 3-day history of worsening low back pain impairing her ability to ambulate but without bowel or bladder problems or saddle anesthesia.   -CT on presentation showing subacute L1 vertebral fracture with 4 mm retropulsion without associated spinal stenosis.  30% height loss -Brace ordered from the emergency room -emergency room provider spoke with neurosurgery who recommended brace and follow-up in 1 week with Dr. Ronnald Ramp  -On multiple PO as well as IV narcotics overnight -NCCSR reviewed. No recent outpatient opioids have been prescribed -Currently on oxycodone q4hrs. Pain poorly controlled. See below, pending improvement in BP, would plan on adding oxycontin    Depression/anxiety -Continue alprazolam as tolerated    Essential hypertension -Pt hypotesnsive overnight, likely made worse with concurrent opioid analgesic -Have stopped verapamil to allow BP for analgesia  Dehydration -Pt hypotensive this AM, likely made worse by multiple narcotics given in past several hrs -Mucus membranes remain dry on exam  -Have given pt 1L IVF bolus and will continue basal IVF -Repeat bmet in AM  Bladder outlet obstruction - Over 700cc noted on bladder US with nearly 1L out via I/O cath recently -Now voiding since oxybutinin was held  DVT prophylaxis: Lovenox subq Code Status: DNR Family Communication: Pt in room, family currently at bedside  Status is: Observation  The patient will require care spanning > 2 midnights and  should be moved to inpatient because: Ongoing active pain requiring inpatient pain management, Unsafe d/c plan and IV treatments appropriate due to intensity of illness or inability to take PO  Dispo: The patient is from: Home              Anticipated d/c is to: disposition to be determined per PT eval               Anticipated d/c date is: 2 days              Patient currently is not medically stable to d/c.   Consultants:   Neurosurgery  Procedures:     Antimicrobials: Anti-infectives (From admission, onward)   None      Subjective: Still complaining of back pain  Objective: Vitals:   04/06/20 0725 04/06/20 0734 04/06/20 0950 04/06/20 1209  BP: (!) 74/39 (!) 95/48 (!) 95/52 (!) 100/45  Pulse: 68   66  Resp: 16   16  Temp: 98.5 F (36.9 C)   98.8 F (37.1 C)  TempSrc: Oral   Oral  SpO2: 92%   97%  Weight:      Height:        Intake/Output Summary (Last 24 hours) at 04/06/2020 1550 Last data filed at 04/06/2020 1236 Gross per 24 hour  Intake 2114.5 ml  Output 1910 ml  Net 204.5 ml   Filed Weights   04/04/20 2018  Weight: 71.8 kg    Examination: General exam: Awake, laying in bed, in nad, mucus membranes dry Respiratory system: Normal respiratory effort, no wheezing Cardiovascular system: regular rate, s1, s2 Gastrointestinal system: Soft, nondistended, positive BS Central nervous system: CN2-12 grossly intact, strength intact Extremities: Perfused, no clubbing Skin: Normal skin turgor, no notable skin lesions seen Psychiatry: Mood normal // no visual hallucinations   Data Reviewed: I have personally reviewed following labs and imaging studies  CBC: Recent Labs  Lab 04/04/20 1604  WBC 7.3  NEUTROABS 4.7  HGB 13.7  HCT 41.1  MCV 93.6  PLT 841   Basic Metabolic Panel: Recent Labs  Lab 04/04/20 1604 04/05/20 0624  NA 138  --   K 4.0  --   CL 105  --   CO2 21*  --   GLUCOSE 104*  --   BUN 8  --   CREATININE 0.81  --   CALCIUM 9.2  --   MG  --  1.9   GFR: Estimated Creatinine Clearance: 48.8 mL/min (by C-G formula based on SCr of 0.81 mg/dL). Liver Function Tests: No results for input(s): AST, ALT, ALKPHOS, BILITOT, PROT, ALBUMIN in the last 168 hours. No results for input(s): LIPASE, AMYLASE in the last 168 hours. No results for input(s):  AMMONIA in the last 168 hours. Coagulation Profile: No results for input(s): INR, PROTIME in the last 168 hours. Cardiac Enzymes: No results for input(s): CKTOTAL, CKMB, CKMBINDEX, TROPONINI in the last 168 hours. BNP (last 3 results) No results for input(s): PROBNP in the last 8760 hours. HbA1C: No results for input(s): HGBA1C in the last 72 hours. CBG: No results for input(s): GLUCAP in the last 168 hours. Lipid Profile: No results for input(s): CHOL, HDL, LDLCALC, TRIG, CHOLHDL, LDLDIRECT in the last 72 hours. Thyroid Function Tests: No results for input(s): TSH, T4TOTAL, FREET4, T3FREE, THYROIDAB in the last 72 hours. Anemia Panel: No results for input(s): VITAMINB12, FOLATE, FERRITIN, TIBC, IRON, RETICCTPCT in the last 72 hours. Sepsis Labs: No results for input(s): PROCALCITON, LATICACIDVEN in the last  168 hours.  Recent Results (from the past 240 hour(s))  SARS Coronavirus 2 by RT PCR (hospital order, performed in [facility] hospital lab) Nasopharyngeal Nasopharyngeal Swab     Status: None   Collection Time: 04/04/20  8:00 PM   Specimen: Nasopharyngeal Swab  Result Value Ref Range Status   SARS Coronavirus 2 NEGATIVE NEGATIVE Final    Comment: (NOTE) SARS-CoV-2 target nucleic acids are NOT DETECTED.  The SARS-CoV-2 RNA is generally detectable in upper and lower respiratory specimens during the acute phase of infection. The lowest concentration of SARS-CoV-2 viral copies this assay can detect is 250 copies / mL. A negative result does not preclude SARS-CoV-2 infection and should not be used as the sole basis for treatment or other patient management decisions.  A negative result may occur with improper specimen collection / handling, submission of specimen other than nasopharyngeal swab, presence of viral mutation(s) within the areas targeted by this assay, and inadequate number of viral copies (<250 copies / mL). A negative result must be combined with  clinical observations, patient history, and epidemiological information.  Fact Sheet for Patients:   StrictlyIdeas.no  Fact Sheet for Healthcare Providers: BankingDealers.co.za  This test is not yet approved or  cleared by the [place] FDA and has been authorized for detection and/or diagnosis of SARS-CoV-2 by FDA under an Emergency Use Authorization (EUA).  This EUA will remain in effect (meaning this test can be used) for the duration of the COVID-19 declaration under Section 564(b)(1) of the Act, 21 U.S.C. section 360bbb-3(b)(1), unless the authorization is terminated or revoked sooner.  Performed at [facility], [place] [address] [place], [place] [address]      Radiology Studies: CT Lumbar Spine Wo Contrast  Result Date: 04/04/2020 CLINICAL DATA:  Low back pain for 3 days. EXAM: CT LUMBAR SPINE WITHOUT CONTRAST TECHNIQUE: Multidetector CT imaging of the lumbar spine was performed without intravenous contrast administration. Multiplanar CT image reconstructions were also generated. COMPARISON:  Lumbar spine radiographs 04/04/2020 and MRI 09/09/2018 FINDINGS: Segmentation: 5 lumbar type vertebrae. Alignment: Mild thoracolumbar levoscoliosis. Grade 1 anterolisthesis of L4 on L5, unchanged from the prior MRI. Vertebrae: L1 superior endplate compression fracture with 30% vertebral body height loss and 4 mm retropulsion of the superior endplate. A fracture line remains visible although there is surrounding sclerosis. Hemangioma in the L4 vertebral body. Paraspinal and other soft tissues: Aortic atherosclerosis without aneurysm. Disc levels: Disc bulging and posterior element hypertrophy result in chronic mild-to-moderate spinal stenosis at L3-4 and moderate to severe spinal stenosis at L4-5. No significant spinal stenosis related to mild L1 retropulsion. IMPRESSION: 1. Possibly subacute L1 compression fracture with 30% height loss and 4  mm retropulsion. No associated spinal stenosis. 2. Chronic mild-to-moderate spinal stenosis at L3-4 and moderate to severe spinal stenosis at L4-5. 3. Aortic Atherosclerosis (ICD10-I70.0). Electronically Signed   By: Logan Bores M.D.   On: 04/04/2020 17:39    Scheduled Meds: . enoxaparin (LOVENOX) injection  40 mg Subcutaneous Q24H  . gabapentin  300 mg Oral QHS  . magnesium oxide  200 mg Oral Daily  . pantoprazole  20 mg Oral Daily   Continuous Infusions: . lactated ringers 75 mL/hr at 04/05/20 1115     LOS: 1 day   Marylu Lund, MD [facility] Pager On Amion  If 7PM-7AM, please contact night-coverage 04/06/2020, 3:50 PM

## 2020-04-06 NOTE — Progress Notes (Signed)
PROGRESS NOTE    Tracey Armstrong  EXH:[id number] DOB: 09/26/1935 DOA: 04/04/2020 PCP: Martin[place], [name], MD    Brief Narrative:  85 y.o. female with medical history significant for Hypertension, depression and chronic low back pain who presents to the emergency room with a 3-day history of worsening lower back pain and bilateral hip pain to the point where she is unable to get out of bed and ambulate at her baseline.  She denies recent fall or injury.  The pain is getting increasingly worse and is affecting her ability to manage independently as per her baseline.  The pain is aching and of moderate to severe intensity and does not radiate.  She denies bowel or bladder difficulties and denies numbness in the groin and inner thigh area. ED Course: On arrival to the emergency room vitals were within normal limits.  Her blood work was not remarkable.  She initially had an x-ray of the lumbar spine and pelvis showed an L1 compression fracture likely chronic.  Patient was treated with IV narcotics but continued to have intractable pain.  She subsequently had a CTA of the LS spine that showed possibly subacute L1 compression fracture with 30% height loss and 4 mm retropulsion.  No associated spinal stenosis.  So showed chronic mild to moderate spinal stenosis L3-4 and moderate to severe L4-5.  Neurosurgery was consulted from the emergency room.  Recommendations pending at the time of admission.  TLSO brace ordered.  Admission requested due to unsafe discharge to current level of care at her facility wellness for pain management..  Assessment & Plan:   Principal Problem:   Intractable low back pain Active Problems:   Depression   Essential hypertension   Chronic low back pain   Closed wedge compression fracture of L1 vertebra (HCC)   Ambulatory dysfunction   L1 vertebral fracture (HCC)   Dehydration  Principal Problem:   Intractable low back pain   Subacute closed wedge compression fracture of L1  vertebra (HCC) with 4 mm retropulsion without spinal stenosis   Ambulatory dysfunction   Chronic low back pain, moderate to severe spinal stenosis -Patient presents with a 2 to 3-day history of worsening low back pain impairing her ability to ambulate but without bowel or bladder problems or saddle anesthesia.   -CT on presentation showing subacute L1 vertebral fracture with 4 mm retropulsion without associated spinal stenosis.  30% height loss -Brace ordered from the emergency room -emergency room provider spoke with neurosurgery who recommended brace and follow-up in 1 week with Dr. Ronnald Ramp  -On multiple PO as well as IV narcotics overnight -NCCSR reviewed. No recent outpatient opioids have been prescribed -Currently on oxycodone q4hrs. Pain poorly controlled. See below, pending improvement in BP, would plan on adding oxycontin    Depression/anxiety -Continue alprazolam as tolerated    Essential hypertension -Pt hypotesnsive overnight, likely made worse with concurrent opioid analgesic -Have stopped verapamil to allow BP for analgesia  Dehydration -Pt hypotensive this AM, likely made worse by multiple narcotics given in past several hrs -Mucus membranes remain dry on exam  -Have given pt 1L IVF bolus and will continue basal IVF -Repeat bmet in AM  Bladder outlet obstruction - Over 700cc noted on bladder US with nearly 1L out via I/O cath recently -Now voiding since oxybutinin was held  DVT prophylaxis: Lovenox subq Code Status: DNR Family Communication: Pt in room, family currently at bedside  Status is: Observation  The patient will require care spanning > 2 midnights and  should be moved to inpatient because: Ongoing active pain requiring inpatient pain management, Unsafe d/c plan and IV treatments appropriate due to intensity of illness or inability to take PO  Dispo: The patient is from: Home              Anticipated d/c is to: disposition to be determined per PT eval               Anticipated d/c date is: 2 days              Patient currently is not medically stable to d/c.   Consultants:   Neurosurgery  Procedures:     Antimicrobials: Anti-infectives (From admission, onward)   None      Subjective: Still complaining of back pain  Objective: Vitals:   04/06/20 0725 04/06/20 0734 04/06/20 0950 04/06/20 1209  BP: (!) 74/39 (!) 95/48 (!) 95/52 (!) 100/45  Pulse: 68   66  Resp: 16   16  Temp: 98.5 F (36.9 C)   98.8 F (37.1 C)  TempSrc: Oral   Oral  SpO2: 92%   97%  Weight:      Height:        Intake/Output Summary (Last 24 hours) at 04/06/2020 1541 Last data filed at 04/06/2020 1236 Gross per 24 hour  Intake 2114.5 ml  Output 1910 ml  Net 204.5 ml   Filed Weights   04/04/20 2018  Weight: 71.8 kg    Examination: General exam: Awake, laying in bed, in nad Respiratory system: Normal respiratory effort, no wheezing Cardiovascular system: regular rate, s1, s2 Gastrointestinal system: Soft, nondistended, positive BS Central nervous system: CN2-12 grossly intact, strength intact Extremities: Perfused, no clubbing Skin: Normal skin turgor, no notable skin lesions seen Psychiatry: Mood normal // no visual hallucinations   Data Reviewed: I have personally reviewed following labs and imaging studies  CBC: Recent Labs  Lab 04/04/20 1604  WBC 7.3  NEUTROABS 4.7  HGB 13.7  HCT 41.1  MCV 93.6  PLT 573   Basic Metabolic Panel: Recent Labs  Lab 04/04/20 1604 04/05/20 0624  NA 138  --   K 4.0  --   CL 105  --   CO2 21*  --   GLUCOSE 104*  --   BUN 8  --   CREATININE 0.81  --   CALCIUM 9.2  --   MG  --  1.9   GFR: Estimated Creatinine Clearance: 48.8 mL/min (by C-G formula based on SCr of 0.81 mg/dL). Liver Function Tests: No results for input(s): AST, ALT, ALKPHOS, BILITOT, PROT, ALBUMIN in the last 168 hours. No results for input(s): LIPASE, AMYLASE in the last 168 hours. No results for input(s): AMMONIA in the last  168 hours. Coagulation Profile: No results for input(s): INR, PROTIME in the last 168 hours. Cardiac Enzymes: No results for input(s): CKTOTAL, CKMB, CKMBINDEX, TROPONINI in the last 168 hours. BNP (last 3 results) No results for input(s): PROBNP in the last 8760 hours. HbA1C: No results for input(s): HGBA1C in the last 72 hours. CBG: No results for input(s): GLUCAP in the last 168 hours. Lipid Profile: No results for input(s): CHOL, HDL, LDLCALC, TRIG, CHOLHDL, LDLDIRECT in the last 72 hours. Thyroid Function Tests: No results for input(s): TSH, T4TOTAL, FREET4, T3FREE, THYROIDAB in the last 72 hours. Anemia Panel: No results for input(s): VITAMINB12, FOLATE, FERRITIN, TIBC, IRON, RETICCTPCT in the last 72 hours. Sepsis Labs: No results for input(s): PROCALCITON, LATICACIDVEN in the last 168 hours.  Recent Results (from the past 240 hour(s))  SARS Coronavirus 2 by RT PCR (hospital order, performed in [facility] hospital lab) Nasopharyngeal Nasopharyngeal Swab     Status: None   Collection Time: 04/04/20  8:00 PM   Specimen: Nasopharyngeal Swab  Result Value Ref Range Status   SARS Coronavirus 2 NEGATIVE NEGATIVE Final    Comment: (NOTE) SARS-CoV-2 target nucleic acids are NOT DETECTED.  The SARS-CoV-2 RNA is generally detectable in upper and lower respiratory specimens during the acute phase of infection. The lowest concentration of SARS-CoV-2 viral copies this assay can detect is 250 copies / mL. A negative result does not preclude SARS-CoV-2 infection and should not be used as the sole basis for treatment or other patient management decisions.  A negative result may occur with improper specimen collection / handling, submission of specimen other than nasopharyngeal swab, presence of viral mutation(s) within the areas targeted by this assay, and inadequate number of viral copies (<250 copies / mL). A negative result must be combined with clinical observations, patient  history, and epidemiological information.  Fact Sheet for Patients:   StrictlyIdeas.no  Fact Sheet for Healthcare Providers: BankingDealers.co.za  This test is not yet approved or  cleared by the [place] FDA and has been authorized for detection and/or diagnosis of SARS-CoV-2 by FDA under an Emergency Use Authorization (EUA).  This EUA will remain in effect (meaning this test can be used) for the duration of the COVID-19 declaration under Section 564(b)(1) of the Act, 21 U.S.C. section 360bbb-3(b)(1), unless the authorization is terminated or revoked sooner.  Performed at [facility], [place] [address] [place], [place] [address]      Radiology Studies: CT Lumbar Spine Wo Contrast  Result Date: 04/04/2020 CLINICAL DATA:  Low back pain for 3 days. EXAM: CT LUMBAR SPINE WITHOUT CONTRAST TECHNIQUE: Multidetector CT imaging of the lumbar spine was performed without intravenous contrast administration. Multiplanar CT image reconstructions were also generated. COMPARISON:  Lumbar spine radiographs 04/04/2020 and MRI 09/09/2018 FINDINGS: Segmentation: 5 lumbar type vertebrae. Alignment: Mild thoracolumbar levoscoliosis. Grade 1 anterolisthesis of L4 on L5, unchanged from the prior MRI. Vertebrae: L1 superior endplate compression fracture with 30% vertebral body height loss and 4 mm retropulsion of the superior endplate. A fracture line remains visible although there is surrounding sclerosis. Hemangioma in the L4 vertebral body. Paraspinal and other soft tissues: Aortic atherosclerosis without aneurysm. Disc levels: Disc bulging and posterior element hypertrophy result in chronic mild-to-moderate spinal stenosis at L3-4 and moderate to severe spinal stenosis at L4-5. No significant spinal stenosis related to mild L1 retropulsion. IMPRESSION: 1. Possibly subacute L1 compression fracture with 30% height loss and 4 mm retropulsion. No associated  spinal stenosis. 2. Chronic mild-to-moderate spinal stenosis at L3-4 and moderate to severe spinal stenosis at L4-5. 3. Aortic Atherosclerosis (ICD10-I70.0). Electronically Signed   By: Logan Bores M.D.   On: 04/04/2020 17:39    Scheduled Meds: . enoxaparin (LOVENOX) injection  40 mg Subcutaneous Q24H  . gabapentin  300 mg Oral QHS  . magnesium oxide  200 mg Oral Daily  . pantoprazole  20 mg Oral Daily   Continuous Infusions: . lactated ringers 75 mL/hr at 04/05/20 1115     LOS: 1 day   Marylu Lund, MD [facility] Pager On Amion  If 7PM-7AM, please contact night-coverage 04/06/2020, 3:41 PM

## 2020-04-07 LAB — COMPREHENSIVE METABOLIC PANEL
ALT: 17 U/L (ref 0–44)
AST: 20 U/L (ref 15–41)
Albumin: 2.5 g/dL — ABNORMAL LOW (ref 3.5–5.0)
Alkaline Phosphatase: 44 U/L (ref 38–126)
Anion gap: 8 (ref 5–15)
BUN: 6 mg/dL — ABNORMAL LOW (ref 8–23)
CO2: 25 mmol/L (ref 22–32)
Calcium: 8.7 mg/dL — ABNORMAL LOW (ref 8.9–10.3)
Chloride: 99 mmol/L (ref 98–111)
Creatinine, Ser: 0.67 mg/dL (ref 0.44–1.00)
GFR calc Af Amer: >60 mL/min (ref >60)
GFR calc non Af Amer: >60 mL/min (ref >60)
Glucose, Bld: 122 mg/dL — ABNORMAL HIGH (ref 70–99)
Potassium: 4 mmol/L (ref 3.5–5.1)
Sodium: 132 mmol/L — ABNORMAL LOW (ref 135–145)
Total Bilirubin: 0.5 mg/dL (ref 0.3–1.2)
Total Protein: 5.6 g/dL — ABNORMAL LOW (ref 6.5–8.1)

## 2020-04-07 MED ORDER — OXYCODONE HCL ER 15 MG PO T12A
15.0000 mg | EXTENDED_RELEASE_TABLET | Freq: Two times a day (BID) | ORAL | Status: DC
  Administered 2020-04-07 – 2020-04-08 (×2): 15 mg via ORAL
  Filled 2020-04-07 (×2): qty 1

## 2020-04-07 MED ORDER — VERAPAMIL HCL 40 MG PO TABS
40.0000 mg | ORAL_TABLET | Freq: Two times a day (BID) | ORAL | Status: DC
  Administered 2020-04-07 – 2020-04-13 (×11): 40 mg via ORAL
  Filled 2020-04-07 (×14): qty 1

## 2020-04-07 NOTE — Progress Notes (Signed)
PROGRESS NOTE    Tracey Armstrong  TDV:[id number] DOB: July 31, 1935 DOA: 04/04/2020 PCP: [place], [name], MD    Brief Narrative:  85 y.o. female with medical history significant for Hypertension, depression and chronic low back pain who presents to the emergency room with a 3-day history of worsening lower back pain and bilateral hip pain to the point where she is unable to get out of bed and ambulate at her baseline.  She denies recent fall or injury.  The pain is getting increasingly worse and is affecting her ability to manage independently as per her baseline.  The pain is aching and of moderate to severe intensity and does not radiate.  She denies bowel or bladder difficulties and denies numbness in the groin and inner thigh area. ED Course: On arrival to the emergency room vitals were within normal limits.  Her blood work was not remarkable.  She initially had an x-ray of the lumbar spine and pelvis showed an L1 compression fracture likely chronic.  Patient was treated with IV narcotics but continued to have intractable pain.  She subsequently had a CTA of the LS spine that showed possibly subacute L1 compression fracture with 30% height loss and 4 mm retropulsion.  No associated spinal stenosis.  So showed chronic mild to moderate spinal stenosis L3-4 and moderate to severe L4-5.  Neurosurgery was consulted from the emergency room.  Recommendations pending at the time of admission.  TLSO brace ordered.  Admission requested due to unsafe discharge to current level of care at her facility wellness for pain management..  Assessment & Plan:   Principal Problem:   Intractable low back pain Active Problems:   Depression   Essential hypertension   Chronic low back pain   Closed wedge compression fracture of L1 vertebra (HCC)   Ambulatory dysfunction   L1 vertebral fracture (HCC)   Dehydration  Principal Problem:   Intractable low back pain   Subacute closed wedge compression fracture of L1  vertebra (HCC) with 4 mm retropulsion without spinal stenosis   Ambulatory dysfunction   Chronic low back pain, moderate to severe spinal stenosis -Patient presents with a 2 to 3-day history of worsening low back pain impairing her ability to ambulate but without bowel or bladder problems or saddle anesthesia.   -CT on presentation showing subacute L1 vertebral fracture with 4 mm retropulsion without associated spinal stenosis.  30% height loss -Brace ordered from the emergency room -emergency room provider spoke with neurosurgery who recommended brace and follow-up in 1 week with Dr. Ronnald Ramp  -On multiple PO as well as IV narcotics overnight -NCCSR reviewed. No recent outpatient opioids have been prescribed -Currently on oxycodone q4hrs. Now on oxycontin 10mg  q12hrs. Pt reports pain seems improved but is still hurting -Pt understands goal is to not to achieve 0/10 pain, but instead to make pain more tolerable to allow physical therapy    Depression/anxiety -Continue alprazolam as tolerated    Essential hypertension -Recently hypotensive requiring IVF boluses -BP improved after verapamil stopped -Will continue lower dose verapamil to allow pt to tolerate analgesia  Dehydration -Improved with IVF hydration -Repeat bmet in AM  Bladder outlet obstruction - Over 700cc noted on bladder US with nearly 1L out via I/O cath recently -Now voiding well since oxybutinin was held  DVT prophylaxis: Lovenox subq Code Status: DNR Family Communication: Pt in room, family not at bedside  Status is: Inpatient  The patient will require care spanning > 2 midnights and should be moved to  inpatient because: Ongoing active pain requiring inpatient pain management and Unsafe d/c plan  Dispo: The patient is from: Home              Anticipated d/c is to: SNF              Anticipated d/c date is: 2 days              Patient currently is not medically stable to d/c.   Consultants:    Neurosurgery  Procedures:     Antimicrobials: Anti-infectives (From admission, onward)   None      Subjective: Still with back pain this AM  Objective: Vitals:   04/06/20 2033 04/06/20 2346 04/07/20 0549 04/07/20 0822  BP: (!) 156/82 (!) 131/59 (!) 151/74 (!) 155/68  Pulse: (!) 101 73 82 85  Resp: 18 18 18    Temp: 98.4 F (36.9 C) 98.6 F (37 C) 98.8 F (37.1 C) 97.6 F (36.4 C)  TempSrc: Oral Oral Oral Oral  SpO2: 91% 93% 93% 96%  Weight:      Height:        Intake/Output Summary (Last 24 hours) at 04/07/2020 1412 Last data filed at 04/07/2020 9675 Gross per 24 hour  Intake 600 ml  Output 1350 ml  Net -750 ml   Filed Weights   04/04/20 2018  Weight: 71.8 kg    Examination: General exam: Conversant, in no acute distress Respiratory system: normal chest rise, clear, no audible wheezing Cardiovascular system: regular rhythm, s1-s2 Gastrointestinal system: Nondistended, nontender, pos BS Central nervous system: No seizures, no tremors Extremities: No cyanosis, no joint deformities Skin: No rashes, no pallor Psychiatry: Affect normal // no auditory hallucinations   Data Reviewed: I have personally reviewed following labs and imaging studies  CBC: Recent Labs  Lab 04/04/20 1604  WBC 7.3  NEUTROABS 4.7  HGB 13.7  HCT 41.1  MCV 93.6  PLT 916   Basic Metabolic Panel: Recent Labs  Lab 04/04/20 1604 04/05/20 0624 04/07/20 0656  NA 138  --  132*  K 4.0  --  4.0  CL 105  --  99  CO2 21*  --  25  GLUCOSE 104*  --  122*  BUN 8  --  6*  CREATININE 0.81  --  0.67  CALCIUM 9.2  --  8.7*  MG  --  1.9  --    GFR: Estimated Creatinine Clearance: 49.5 mL/min (by C-G formula based on SCr of 0.67 mg/dL). Liver Function Tests: Recent Labs  Lab 04/07/20 0656  AST 20  ALT 17  ALKPHOS 44  BILITOT 0.5  PROT 5.6*  ALBUMIN 2.5*   No results for input(s): LIPASE, AMYLASE in the last 168 hours. No results for input(s): AMMONIA in the last 168  hours. Coagulation Profile: No results for input(s): INR, PROTIME in the last 168 hours. Cardiac Enzymes: No results for input(s): CKTOTAL, CKMB, CKMBINDEX, TROPONINI in the last 168 hours. BNP (last 3 results) No results for input(s): PROBNP in the last 8760 hours. HbA1C: No results for input(s): HGBA1C in the last 72 hours. CBG: No results for input(s): GLUCAP in the last 168 hours. Lipid Profile: No results for input(s): CHOL, HDL, LDLCALC, TRIG, CHOLHDL, LDLDIRECT in the last 72 hours. Thyroid Function Tests: No results for input(s): TSH, T4TOTAL, FREET4, T3FREE, THYROIDAB in the last 72 hours. Anemia Panel: No results for input(s): VITAMINB12, FOLATE, FERRITIN, TIBC, IRON, RETICCTPCT in the last 72 hours. Sepsis Labs: No results for input(s): PROCALCITON, LATICACIDVEN  in the last 168 hours.  Recent Results (from the past 240 hour(s))  SARS Coronavirus 2 by RT PCR (hospital order, performed in [facility] hospital lab) Nasopharyngeal Nasopharyngeal Swab     Status: None   Collection Time: 04/04/20  8:00 PM   Specimen: Nasopharyngeal Swab  Result Value Ref Range Status   SARS Coronavirus 2 NEGATIVE NEGATIVE Final    Comment: (NOTE) SARS-CoV-2 target nucleic acids are NOT DETECTED.  The SARS-CoV-2 RNA is generally detectable in upper and lower respiratory specimens during the acute phase of infection. The lowest concentration of SARS-CoV-2 viral copies this assay can detect is 250 copies / mL. A negative result does not preclude SARS-CoV-2 infection and should not be used as the sole basis for treatment or other patient management decisions.  A negative result may occur with improper specimen collection / handling, submission of specimen other than nasopharyngeal swab, presence of viral mutation(s) within the areas targeted by this assay, and inadequate number of viral copies (<250 copies / mL). A negative result must be combined with clinical observations, patient history,  and epidemiological information.  Fact Sheet for Patients:   StrictlyIdeas.no  Fact Sheet for Healthcare Providers: BankingDealers.co.za  This test is not yet approved or  cleared by the [place] FDA and has been authorized for detection and/or diagnosis of SARS-CoV-2 by FDA under an Emergency Use Authorization (EUA).  This EUA will remain in effect (meaning this test can be used) for the duration of the COVID-19 declaration under Section 564(b)(1) of the Act, 21 U.S.C. section 360bbb-3(b)(1), unless the authorization is terminated or revoked sooner.  Performed at [facility], [place] [address] [place], [place] [address]      Radiology Studies: No results found.  Scheduled Meds: . enoxaparin (LOVENOX) injection  40 mg Subcutaneous Q24H  . gabapentin  300 mg Oral QHS  . magnesium oxide  200 mg Oral Daily  . oxyCODONE  10 mg Oral Q12H  . pantoprazole  20 mg Oral Daily  . verapamil  40 mg Oral Q12H   Continuous Infusions: . lactated ringers 75 mL/hr at 04/05/20 1115     LOS: 2 days   Marylu Lund, MD [facility] Pager On Amion  If 7PM-7AM, please contact night-coverage 04/07/2020, 2:12 PM

## 2020-04-08 MED ORDER — OXYCODONE HCL ER 10 MG PO T12A
20.0000 mg | EXTENDED_RELEASE_TABLET | Freq: Two times a day (BID) | ORAL | Status: DC
  Administered 2020-04-08 – 2020-04-13 (×10): 20 mg via ORAL
  Filled 2020-04-08 (×10): qty 2

## 2020-04-08 NOTE — Progress Notes (Signed)
PROGRESS NOTE    Tracey Armstrong  UXN:[id number] DOB: [birth year] DOA: 04/04/2020 PCP: Martinique, [name], MD    Brief Narrative:  85 y.o. female with medical history significant for Hypertension, depression and chronic low back pain who presents to the emergency room with a 3-day history of worsening lower back pain and bilateral hip pain to the point where she is unable to get out of bed and ambulate at her baseline.  She denies recent fall or injury.  The pain is getting increasingly worse and is affecting her ability to manage independently as per her baseline.  The pain is aching and of moderate to severe intensity and does not radiate.  She denies bowel or bladder difficulties and denies numbness in the groin and inner thigh area. ED Course: On arrival to the emergency room vitals were within normal limits.  Her blood work was not remarkable.  She initially had an x-ray of the lumbar spine and pelvis showed an L1 compression fracture likely chronic.  Patient was treated with IV narcotics but continued to have intractable pain.  She subsequently had a CTA of the LS spine that showed possibly subacute L1 compression fracture with 30% height loss and 4 mm retropulsion.  No associated spinal stenosis.  So showed chronic mild to moderate spinal stenosis L3-4 and moderate to severe L4-5.  Neurosurgery was consulted from the emergency room.  Recommendations pending at the time of admission.  TLSO brace ordered.  Admission requested due to unsafe discharge to current level of care at her facility wellness for pain management..  Assessment & Plan:   Principal Problem:   Intractable low back pain Active Problems:   Depression   Essential hypertension   Chronic low back pain   Closed wedge compression fracture of L1 vertebra (HCC)   Ambulatory dysfunction   L1 vertebral fracture (HCC)   Dehydration  Principal Problem:   Intractable low back pain   Subacute closed wedge compression fracture of L1  vertebra (HCC) with 4 mm retropulsion without spinal stenosis   Ambulatory dysfunction   Chronic low back pain, moderate to severe spinal stenosis -Patient presents with a 2 to 3-day history of worsening low back pain impairing her ability to ambulate but without bowel or bladder problems or saddle anesthesia.   -CT on presentation showing subacute L1 vertebral fracture with 4 mm retropulsion without associated spinal stenosis.  30% height loss -Brace ordered from the emergency room -emergency room provider spoke with neurosurgery who recommended brace and follow-up in 1 week with Dr. Ronnald Ramp  -On multiple PO as well as IV narcotics overnight -NCCSR reviewed. No recent outpatient opioids have been prescribed -Currently on oxycodone q4hrs. Now on oxycontin 15mg  q12hrs. Pt reports pain seems improved but is still complaining of 6/10 pain at rest -Pt understands goal is to not to achieve 0/10 pain, but instead to make pain more tolerable to allow physical therapy -Will increase oxycontin to 20mg  q12hrs    Depression/anxiety -Continue alprazolam as tolerated    Essential hypertension -Recently hypotensive requiring IVF boluses -BP improved after verapamil stopped -Will continue lower dose verapamil to allow pt to tolerate analgesia  Dehydration -Improved with IVF hydration -Follow bmet in AM  Bladder outlet obstruction - Over 700cc noted on bladder US with nearly 1L out via I/O cath recently -Now voiding well since oxybutinin was held  DVT prophylaxis: Lovenox subq Code Status: DNR Family Communication: Pt in room, family not at bedside  Status is: Inpatient  The patient will  require care spanning > 2 midnights and should be moved to inpatient because: Ongoing active pain requiring inpatient pain management and Unsafe d/c plan  Dispo: The patient is from: Home              Anticipated d/c is to: SNF              Anticipated d/c date is: 2 days              Patient currently is not  medically stable to d/c.   Consultants:   Neurosurgery  Procedures:     Antimicrobials: Anti-infectives (From admission, onward)   None      Subjective: Still complaining of continued back pain even at rest  Objective: Vitals:   04/08/20 0008 04/08/20 0502 04/08/20 0749 04/08/20 1300  BP: (!) 110/59 (!) 146/63 (!) 153/71 128/77  Pulse: 73 82 91   Resp: 18 18 20    Temp: 98.4 F (36.9 C) 98.2 F (36.8 C) 98.1 F (36.7 C) 98.6 F (37 C)  TempSrc: Oral Oral  Oral  SpO2: 95% 95% 96% 96%  Weight:      Height:        Intake/Output Summary (Last 24 hours) at 04/08/2020 1617 Last data filed at 04/08/2020 1300 Gross per 24 hour  Intake 480 ml  Output 2050 ml  Net -1570 ml   Filed Weights   04/04/20 2018  Weight: 71.8 kg    Examination: General exam: Awake, laying in bed, in nad Respiratory system: Normal respiratory effort, no wheezing Cardiovascular system: regular rate, s1, s2 Gastrointestinal system: Soft, nondistended, positive BS Central nervous system: CN2-12 grossly intact, strength intact Extremities: Perfused, no clubbing Skin: Normal skin turgor, no notable skin lesions seen Psychiatry: Mood normal // no visual hallucinations   Data Reviewed: I have personally reviewed following labs and imaging studies  CBC: Recent Labs  Lab 04/04/20 1604  WBC 7.3  NEUTROABS 4.7  HGB 13.7  HCT 41.1  MCV 93.6  PLT 637   Basic Metabolic Panel: Recent Labs  Lab 04/04/20 1604 04/05/20 0624 04/07/20 0656  NA 138  --  132*  K 4.0  --  4.0  CL 105  --  99  CO2 21*  --  25  GLUCOSE 104*  --  122*  BUN 8  --  6*  CREATININE 0.81  --  0.67  CALCIUM 9.2  --  8.7*  MG  --  1.9  --    GFR: Estimated Creatinine Clearance: 49.5 mL/min (by C-G formula based on SCr of 0.67 mg/dL). Liver Function Tests: Recent Labs  Lab 04/07/20 0656  AST 20  ALT 17  ALKPHOS 44  BILITOT 0.5  PROT 5.6*  ALBUMIN 2.5*   No results for input(s): LIPASE, AMYLASE in the last  168 hours. No results for input(s): AMMONIA in the last 168 hours. Coagulation Profile: No results for input(s): INR, PROTIME in the last 168 hours. Cardiac Enzymes: No results for input(s): CKTOTAL, CKMB, CKMBINDEX, TROPONINI in the last 168 hours. BNP (last 3 results) No results for input(s): PROBNP in the last 8760 hours. HbA1C: No results for input(s): HGBA1C in the last 72 hours. CBG: No results for input(s): GLUCAP in the last 168 hours. Lipid Profile: No results for input(s): CHOL, HDL, LDLCALC, TRIG, CHOLHDL, LDLDIRECT in the last 72 hours. Thyroid Function Tests: No results for input(s): TSH, T4TOTAL, FREET4, T3FREE, THYROIDAB in the last 72 hours. Anemia Panel: No results for input(s): VITAMINB12, FOLATE, FERRITIN, TIBC,  IRON, RETICCTPCT in the last 72 hours. Sepsis Labs: No results for input(s): PROCALCITON, LATICACIDVEN in the last 168 hours.  Recent Results (from the past 240 hour(s))  SARS Coronavirus 2 by RT PCR (hospital order, performed in [facility] hospital lab) Nasopharyngeal Nasopharyngeal Swab     Status: None   Collection Time: 04/04/20  8:00 PM   Specimen: Nasopharyngeal Swab  Result Value Ref Range Status   SARS Coronavirus 2 NEGATIVE NEGATIVE Final    Comment: (NOTE) SARS-CoV-2 target nucleic acids are NOT DETECTED.  The SARS-CoV-2 RNA is generally detectable in upper and lower respiratory specimens during the acute phase of infection. The lowest concentration of SARS-CoV-2 viral copies this assay can detect is 250 copies / mL. A negative result does not preclude SARS-CoV-2 infection and should not be used as the sole basis for treatment or other patient management decisions.  A negative result may occur with improper specimen collection / handling, submission of specimen other than nasopharyngeal swab, presence of viral mutation(s) within the areas targeted by this assay, and inadequate number of viral copies (<250 copies / mL). A negative result  must be combined with clinical observations, patient history, and epidemiological information.  Fact Sheet for Patients:   StrictlyIdeas.no  Fact Sheet for Healthcare Providers: BankingDealers.co.za  This test is not yet approved or  cleared by the [place] FDA and has been authorized for detection and/or diagnosis of SARS-CoV-2 by FDA under an Emergency Use Authorization (EUA).  This EUA will remain in effect (meaning this test can be used) for the duration of the COVID-19 declaration under Section 564(b)(1) of the Act, 21 U.S.C. section 360bbb-3(b)(1), unless the authorization is terminated or revoked sooner.  Performed at [facility], [place] [address] [place], [place] [address]      Radiology Studies: No results found.  Scheduled Meds: . enoxaparin (LOVENOX) injection  40 mg Subcutaneous Q24H  . gabapentin  300 mg Oral QHS  . magnesium oxide  200 mg Oral Daily  . oxyCODONE  15 mg Oral Q12H  . pantoprazole  20 mg Oral Daily  . verapamil  40 mg Oral Q12H   Continuous Infusions: . lactated ringers 75 mL/hr at 04/08/20 7681     LOS: 3 days   Marylu Lund, MD [facility] Pager On Amion  If 7PM-7AM, please contact night-coverage 04/08/2020, 4:17 PM

## 2020-04-09 ENCOUNTER — Ambulatory Visit: Payer: Medicare Other | Admitting: Physician Assistant

## 2020-04-09 MED ORDER — BISACODYL 10 MG RE SUPP
10.0000 mg | Freq: Once | RECTAL | Status: AC
  Administered 2020-04-09: 10 mg via RECTAL
  Filled 2020-04-09: qty 1

## 2020-04-09 MED ORDER — POLYETHYLENE GLYCOL 3350 17 GM/SCOOP PO POWD
1.0000 | Freq: Once | ORAL | Status: AC
  Administered 2020-04-09: 255 g via ORAL
  Filled 2020-04-09: qty 255

## 2020-04-09 MED ORDER — POLYETHYLENE GLYCOL 3350 17 GM/SCOOP PO POWD: 1.0000 | Freq: Once | ORAL | Status: DC

## 2020-04-09 MED ORDER — POLYETHYLENE GLYCOL 3350 17 G PO PACK: 17.0000 g | PACK | Freq: Every day | ORAL | Status: DC

## 2020-04-09 MED ORDER — BISACODYL 5 MG PO TBEC: 5.0000 mg | DELAYED_RELEASE_TABLET | Freq: Once | ORAL | Status: DC

## 2020-04-09 MED ORDER — MAGNESIUM CITRATE PO SOLN
1.0000 | Freq: Once | ORAL | Status: AC
  Administered 2020-04-09: 1 via ORAL
  Filled 2020-04-09: qty 296

## 2020-04-09 MED ORDER — DOCUSATE SODIUM 100 MG PO CAPS
100.0000 mg | ORAL_CAPSULE | Freq: Two times a day (BID) | ORAL | Status: DC
  Administered 2020-04-09 – 2020-04-13 (×8): 100 mg via ORAL
  Filled 2020-04-09 (×8): qty 1

## 2020-04-09 NOTE — Progress Notes (Signed)
PROGRESS NOTE    Tracey Armstrong  IHW:[id number] DOB: 10/08/1935 DOA: 04/04/2020 PCP: [place], [name], MD    Brief Narrative:  85 y.o. female with medical history significant for Hypertension, depression and chronic low back pain who presents to the emergency room with a 3-day history of worsening lower back pain and bilateral hip pain to the point where she is unable to get out of bed and ambulate at her baseline.  She denies recent fall or injury.  The pain is getting increasingly worse and is affecting her ability to manage independently as per her baseline.  The pain is aching and of moderate to severe intensity and does not radiate.  She denies bowel or bladder difficulties and denies numbness in the groin and inner thigh area. ED Course: On arrival to the emergency room vitals were within normal limits.  Her blood work was not remarkable.  She initially had an x-ray of the lumbar spine and pelvis showed an L1 compression fracture likely chronic.  Patient was treated with IV narcotics but continued to have intractable pain.  She subsequently had a CTA of the LS spine that showed possibly subacute L1 compression fracture with 30% height loss and 4 mm retropulsion.  No associated spinal stenosis.  So showed chronic mild to moderate spinal stenosis L3-4 and moderate to severe L4-5.  Neurosurgery was consulted from the emergency room.  Recommendations pending at the time of admission.  TLSO brace ordered.  Admission requested due to unsafe discharge to current level of care at her facility wellness for pain management..  Assessment & Plan:   Principal Problem:   Intractable low back pain Active Problems:   Depression   Essential hypertension   Chronic low back pain   Closed wedge compression fracture of L1 vertebra (HCC)   Ambulatory dysfunction   L1 vertebral fracture (HCC)   Dehydration  Principal Problem:   Intractable low back pain   Subacute closed wedge compression fracture of L1  vertebra (HCC) with 4 mm retropulsion without spinal stenosis   Ambulatory dysfunction   Chronic low back pain, moderate to severe spinal stenosis -Patient presents with a 2 to 3-day history of worsening low back pain impairing her ability to ambulate but without bowel or bladder problems or saddle anesthesia.   -CT on presentation showing subacute L1 vertebral fracture with 4 mm retropulsion without associated spinal stenosis.  30% height loss -Brace ordered from the emergency room -emergency room provider spoke with neurosurgery who recommended brace and follow-up in 1 week with Dr. Ronnald Ramp  -On multiple PO as well as IV narcotics overnight -NCCSR reviewed. No recent outpatient opioids have been prescribed -Currently on oxycodone q4hrs. Now on oxycontin 15mg  q12hrs. Pt reports pain seems improved but is still complaining of 6/10 pain at rest -Pt understands goal is to not to achieve 0/10 pain, but instead to make pain more tolerable to allow physical therapy -Now on 20mg  bid oxycodone with breakthrough pain -See below. Have scheduled bowel regimen    Depression/anxiety -Continue alprazolam as tolerated    Essential hypertension -Recently hypotensive requiring IVF boluses -BP improved after verapamil stopped -Will continue lower dose verapamil to allow pt to tolerate analgesia  Dehydration -Improved with IVF hydration -cont to follow renal function  Bladder outlet obstruction - Over 700cc noted on bladder US with nearly 1L out via I/O cath recently -Now voiding well since oxybutinin was held  Constipation -Pt and family report no bowel movement in over one week, likely made worse  with narcotic -Will give stimulant cathartic with Mg citrate.  -Will start daily stool softener and daily miralax while pt is on opiate  DVT prophylaxis: Lovenox subq Code Status: DNR Family Communication: Pt in room, family not at bedside  Status is: Inpatient  The patient will require care  spanning > 2 midnights and should be moved to inpatient because: Ongoing active pain requiring inpatient pain management and Unsafe d/c plan  Dispo: The patient is from: Home              Anticipated d/c is to: SNF              Anticipated d/c date is: 2 days              Patient currently is not medically stable to d/c.   Consultants:   Neurosurgery  Procedures:     Antimicrobials: Anti-infectives (From admission, onward)   None      Subjective: Complains of continued back pain, requesting pain med  Objective: Vitals:   04/09/20 0042 04/09/20 0457 04/09/20 0755 04/09/20 1217  BP: (!) 97/51 (!) 146/55 (!) 158/77 (!) 147/60  Pulse: 70 76 87 86  Resp:   18 20  Temp: 98.5 F (36.9 C) 98.8 F (37.1 C) 99.2 F (37.3 C) 98.3 F (36.8 C)  TempSrc: Oral Oral Oral Oral  SpO2:   94% 91%  Weight:      Height:        Intake/Output Summary (Last 24 hours) at 04/09/2020 1402 Last data filed at 04/09/2020 1300 Gross per 24 hour  Intake 360 ml  Output 2300 ml  Net -1940 ml   Filed Weights   04/04/20 2018  Weight: 71.8 kg    Examination: General exam: Conversant, appears to be in pain Respiratory system: normal chest rise, clear, no audible wheezing Cardiovascular system: regular rhythm, s1-s2 Gastrointestinal system: Nondistended, nontender, pos BS Central nervous system: No seizures, no tremors Extremities: No cyanosis, no joint deformities Skin: No rashes, no pallor Psychiatry: Affect normal // no auditory hallucinations   Data Reviewed: I have personally reviewed following labs and imaging studies  CBC: Recent Labs  Lab 04/04/20 1604  WBC 7.3  NEUTROABS 4.7  HGB 13.7  HCT 41.1  MCV 93.6  PLT 269   Basic Metabolic Panel: Recent Labs  Lab 04/04/20 1604 04/05/20 0624 04/07/20 0656  NA 138  --  132*  K 4.0  --  4.0  CL 105  --  99  CO2 21*  --  25  GLUCOSE 104*  --  122*  BUN 8  --  6*  CREATININE 0.81  --  0.67  CALCIUM 9.2  --  8.7*  MG  --   1.9  --    GFR: Estimated Creatinine Clearance: 49.5 mL/min (by C-G formula based on SCr of 0.67 mg/dL). Liver Function Tests: Recent Labs  Lab 04/07/20 0656  AST 20  ALT 17  ALKPHOS 44  BILITOT 0.5  PROT 5.6*  ALBUMIN 2.5*   No results for input(s): LIPASE, AMYLASE in the last 168 hours. No results for input(s): AMMONIA in the last 168 hours. Coagulation Profile: No results for input(s): INR, PROTIME in the last 168 hours. Cardiac Enzymes: No results for input(s): CKTOTAL, CKMB, CKMBINDEX, TROPONINI in the last 168 hours. BNP (last 3 results) No results for input(s): PROBNP in the last 8760 hours. HbA1C: No results for input(s): HGBA1C in the last 72 hours. CBG: No results for input(s): GLUCAP in the last  168 hours. Lipid Profile: No results for input(s): CHOL, HDL, LDLCALC, TRIG, CHOLHDL, LDLDIRECT in the last 72 hours. Thyroid Function Tests: No results for input(s): TSH, T4TOTAL, FREET4, T3FREE, THYROIDAB in the last 72 hours. Anemia Panel: No results for input(s): VITAMINB12, FOLATE, FERRITIN, TIBC, IRON, RETICCTPCT in the last 72 hours. Sepsis Labs: No results for input(s): PROCALCITON, LATICACIDVEN in the last 168 hours.  Recent Results (from the past 240 hour(s))  SARS Coronavirus 2 by RT PCR (hospital order, performed in [facility] hospital lab) Nasopharyngeal Nasopharyngeal Swab     Status: None   Collection Time: 04/04/20  8:00 PM   Specimen: Nasopharyngeal Swab  Result Value Ref Range Status   SARS Coronavirus 2 NEGATIVE NEGATIVE Final    Comment: (NOTE) SARS-CoV-2 target nucleic acids are NOT DETECTED.  The SARS-CoV-2 RNA is generally detectable in upper and lower respiratory specimens during the acute phase of infection. The lowest concentration of SARS-CoV-2 viral copies this assay can detect is 250 copies / mL. A negative result does not preclude SARS-CoV-2 infection and should not be used as the sole basis for treatment or other patient management  decisions.  A negative result may occur with improper specimen collection / handling, submission of specimen other than nasopharyngeal swab, presence of viral mutation(s) within the areas targeted by this assay, and inadequate number of viral copies (<250 copies / mL). A negative result must be combined with clinical observations, patient history, and epidemiological information.  Fact Sheet for Patients:   StrictlyIdeas.no  Fact Sheet for Healthcare Providers: BankingDealers.co.za  This test is not yet approved or  cleared by the [place] FDA and has been authorized for detection and/or diagnosis of SARS-CoV-2 by FDA under an Emergency Use Authorization (EUA).  This EUA will remain in effect (meaning this test can be used) for the duration of the COVID-19 declaration under Section 564(b)(1) of the Act, 21 U.S.C. section 360bbb-3(b)(1), unless the authorization is terminated or revoked sooner.  Performed at [facility], [place] [address] [place], [place] [address]      Radiology Studies: No results found.  Scheduled Meds: . docusate sodium  100 mg Oral BID  . enoxaparin (LOVENOX) injection  40 mg Subcutaneous Q24H  . gabapentin  300 mg Oral QHS  . magnesium oxide  200 mg Oral Daily  . oxyCODONE  20 mg Oral Q12H  . pantoprazole  20 mg Oral Daily  . [START ON 04/10/2020] polyethylene glycol  17 g Oral Daily  . verapamil  40 mg Oral Q12H   Continuous Infusions: . lactated ringers 75 mL/hr at 04/08/20 2248     LOS: 4 days   Marylu Lund, MD [facility] Pager On Amion  If 7PM-7AM, please contact night-coverage 04/09/2020, 2:02 PM

## 2020-04-09 NOTE — Progress Notes (Signed)
Physical Therapy Treatment Patient Details Name: Tracey Armstrong MRN: [id number] DOB: 01/19/1935 Today's Date: 04/09/2020    History of Present Illness Pt is an 85 y/o female admitted secondary to worsening back pain. Pt found to have subacute L1 compression fx. Being managed with TLSO. PMH includes HTN and chronic back pain.     PT Comments    Pt continues to be in excruciating pain radiating down both LEs greatly limiting mobility. Pt did transition to sitting EOB today x 4 min with maxAx2. However unable to try standing and wasn't able to tolerate the EOB elevated. Pt's brace was soiled in urine. Called [employer] to address.   Follow Up Recommendations  SNF;Supervision/Assistance - 24 hour     Equipment Recommendations  Other (comment)    Recommendations for Other Services       Precautions / Restrictions Precautions Precautions: Back;Fall Precaution Booklet Issued: No Precaution Comments: Verbally reviewed back precautions.  Required Braces or Orthoses: Spinal Brace Spinal Brace: Thoracolumbosacral orthotic Restrictions Weight Bearing Restrictions: No    Mobility  Bed Mobility Overal bed mobility: Needs Assistance Bed Mobility: Rolling;Sidelying to Sit;Sit to Supine Rolling: Max assist Sidelying to sit: Max assist;+2 for physical assistance   Sit to supine: Max assist;+2 for physical assistance   General bed mobility comments: max directional verbal cues, maxA for trunk elevation and LE management, pt with report of increased pain, pt tolerated sitting x 4 min, pt received pain meds at EOB  Transfers                 General transfer comment: unable to attempt due to patients pain and reports of "I feel like i'm going to pass out"  Ambulation/Gait             General Gait Details: unable at this time   Stairs             Wheelchair Mobility    Modified Rankin (Stroke Patients Only)       Balance Overall balance assessment: Needs  assistance Sitting-balance support: Bilateral upper extremity supported;Feet supported Sitting balance-Leahy Scale: Poor Sitting balance - Comments: pt using bilat UE to support self                                    Cognition Arousal/Alertness: Awake/alert Behavior During Therapy: Anxious Overall Cognitive Status: No family/caregiver present to determine baseline cognitive functioning                                        Exercises Other Exercises Other Exercises: pt did attempt to complete bilat LAQ x5 reps bilat LE however pt only able to get 1/4 of ROM    General Comments General comments (skin integrity, edema, etc.): pts R IV line came out, RN notifed       Pertinent Vitals/Pain Pain Assessment: 0-10 Pain Score: 10-Worst pain ever Pain Location: radiating pain down both Legs, L worse than R Pain Descriptors / Indicators: Radiating Pain Intervention(s): RN gave pain meds during session;Repositioned    Home Living                      Prior Function            PT Goals (current goals can now be found in the care plan section) Acute Rehab  PT Goals Patient Stated Goal: to decrease pain  Progress towards PT goals: Progressing toward goals    Frequency    Min 2X/week      PT Plan Current plan remains appropriate    Co-evaluation              AM-PAC PT "6 Clicks" Mobility   Outcome Measure  Help needed turning from your back to your side while in a flat bed without using bedrails?: Total Help needed moving from lying on your back to sitting on the side of a flat bed without using bedrails?: Total Help needed moving to and from a bed to a chair (including a wheelchair)?: Total Help needed standing up from a chair using your arms (e.g., wheelchair or bedside chair)?: Total Help needed to walk in hospital room?: Total Help needed climbing 3-5 steps with a railing? : Total 6 Click Score: 6    End of Session    Activity Tolerance: Patient limited by pain Patient left: in bed;with call bell/phone within reach;with nursing/sitter in room Nurse Communication: Mobility status PT Visit Diagnosis: Unsteadiness on feet (R26.81);Muscle weakness (generalized) (M62.81);Difficulty in walking, not elsewhere classified (R26.2)     Time: 3403-7096 PT Time Calculation (min) (ACUTE ONLY): 34 min  Charges:  $Therapeutic Activity: 23-37 mins                     Kittie Plater, PT, DPT Acute Rehabilitation Services Pager #: [phone] Office #: [phone]    Berline Lopes 04/09/2020, 2:19 PM

## 2020-04-09 NOTE — TOC Initial Note (Signed)
Transition of Care [facility]) - Initial/Assessment Note    Patient Details  Name: Tracey Armstrong MRN: [id number] Date of Birth: 1935/02/02  Transition of [facility]) CM/SW Contact:    Vinie Sill, [place] Phone Number: 04/09/2020, 1:41 PM  Clinical Narrative:                  12:30pm -CSW visit with patient at bedside and her son,Tracey Armstrong. CSW introduced self and explained role. Patient confirmed she is from [place]. Tracey Armstrong states he has been having conversation with [facility] staff to determine if they can met her needs at home. Patient and son states preference is home verses SNF. Patient's son states he will inform CSW of their decision.CSW was given permission to send SNF referrals as back up plan. CSW explained the SNF process. CSW gave Medicare.gov SNF listing to patient's on. Will provide bed offers once available. No further questions or concerns at this time.   1:54pm-CSW called Rollene Fare with [facility]  with [facility]- advised patient and family wants to come home. She advised CSW keep her posted on the family's decision.  2:12pm- called patient's son to follow up on Home verses SNF- left voice message  Will keep following for final disposition.  Thurmond Butts, MSW, [place] Clinical Social Worker        Patient Goals and CMS Choice        Expected Discharge Plan and Services   In-house Referral: Clinical Social Work     Living arrangements for the past 2 months: [place]                                      Prior Living Arrangements/Services Living arrangements for the past 2 months: [place] Lives with:: Self Patient language and need for interpreter reviewed:: No        Need for Family Participation in Patient Care: Yes (Comment) Care giver support system in place?: Yes (comment)   Criminal Activity/Legal Involvement Pertinent to Current Situation/Hospitalization: No - Comment as  needed  Activities of Daily Living      Permission Sought/Granted Permission sought to share information with : Family Supports, Customer service manager, Case Optician, dispensing granted to share information with : Yes, Verbal Permission Granted  Share Information with NAME: Tracey Armstrong  Permission granted to share info w AGENCY: SNFs  Permission granted to share info w Relationship: son  Permission granted to share info w Contact Information: [phone]  Emotional Assessment Appearance:: Appears stated age Attitude/Demeanor/Rapport: Engaged Affect (typically observed): Accepting, Appropriate, Pleasant Orientation: : Oriented to Situation, Oriented to  Time, Oriented to Place, Oriented to Self Alcohol / Substance Use: Not Applicable Psych Involvement: No (comment)  Admission diagnosis:  Low back pain [M54.5] Hip pain [M25.559] Bilateral hip pain [M25.551, M25.552] L1 vertebral fracture (HCC) [S32.019A] Chronic midline low back pain without sciatica [M54.5, G89.29] Closed compression fracture of body of L1 vertebra (HCC) [S32.010A] Dehydration [E86.0] Patient Active Problem List   Diagnosis Date Noted  . Synovial cyst of lumbar facet joint 04/05/2020  . Bilateral stenosis of lateral recess of lumbar spine 04/05/2020  . Dehydration 04/05/2020  . Closed wedge compression fracture of L1 vertebra ([place]) 04/04/2020  . Ambulatory dysfunction 04/04/2020  . Intractable low back pain 04/04/2020  . L1 vertebral fracture ([place]) 04/04/2020  . Urine, incontinence, stress female 02/07/2019  . Knee osteoarthritis 02/01/2018  . Iron deficiency anemia  10/28/2017  . Rhinitis, allergic 07/27/2017  . GERD (gastroesophageal reflux disease) 06/16/2017  . Chronic pain disorder 06/16/2017  . Vitamin D deficiency 05/25/2017  . Hyperlipidemia 05/25/2017  . 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 Pain 02/26/2017  . Chronic low back pain 12/02/2016  . Abdominal pain 03/26/2016  . Adaptive  colitis 03/26/2016  . Peptic esophagitis 03/26/2016  . Neuralgia neuritis, sciatic nerve 03/26/2016  . 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[place]) 06/20/2015  . Chronic insomnia 03/30/2015  . Chronic migraine 01/10/2015  . Depression 04/20/2014  . Generalized anxiety disorder 04/20/2014  . Chronic back pain 04/20/2014  . Chronic daily headache 04/20/2014  . Essential hypertension 04/20/2014  . Migraine headache 04/19/2014   PCP:  Martinique, [name], MD Pharmacy:   [facility] [place], [place] - [address] AT [address] [place] [address] Phone: [phone] Fax: [phone]     Social Determinants of Health (SDOH) Interventions    Readmission Risk Interventions No flowsheet data found.

## 2020-04-10 MED ORDER — PREGABALIN 50 MG PO CAPS
50.0000 mg | ORAL_CAPSULE | Freq: Three times a day (TID) | ORAL | Status: DC
  Administered 2020-04-10 – 2020-04-13 (×10): 50 mg via ORAL
  Filled 2020-04-10 (×10): qty 1

## 2020-04-10 MED ORDER — SORBITOL 70 % SOLN
960.0000 mL | TOPICAL_OIL | Freq: Once | ORAL | Status: AC
  Administered 2020-04-11: 960 mL via RECTAL
  Filled 2020-04-10: qty 473

## 2020-04-10 MED ORDER — LACTULOSE 10 GM/15ML PO SOLN
10.0000 g | ORAL | Status: AC
  Administered 2020-04-10: 10 g via ORAL
  Filled 2020-04-10: qty 15

## 2020-04-10 NOTE — Progress Notes (Signed)
PROGRESS NOTE    Tracey Armstrong  UYQ:[id number] DOB: [birth year] DOA: 04/04/2020 PCP: Martinique, [name], MD    Brief Narrative:  85 y.o. female with medical history significant for Hypertension, depression and chronic low back pain who presents to the emergency room with a 3-day history of worsening lower back pain and bilateral hip pain to the point where she is unable to get out of bed and ambulate at her baseline.  She denies recent fall or injury.  The pain is getting increasingly worse and is affecting her ability to manage independently as per her baseline.  The pain is aching and of moderate to severe intensity and does not radiate.  She denies bowel or bladder difficulties and denies numbness in the groin and inner thigh area. ED Course: On arrival to the emergency room vitals were within normal limits.  Her blood work was not remarkable.  She initially had an x-ray of the lumbar spine and pelvis showed an L1 compression fracture likely chronic.  Patient was treated with IV narcotics but continued to have intractable pain.  She subsequently had a CTA of the LS spine that showed possibly subacute L1 compression fracture with 30% height loss and 4 mm retropulsion.  No associated spinal stenosis.  So showed chronic mild to moderate spinal stenosis L3-4 and moderate to severe L4-5.  Neurosurgery was consulted from the emergency room.  Recommendations pending at the time of admission.  TLSO brace ordered.  Admission requested due to unsafe discharge to current level of care at her facility wellness for pain management..  Assessment & Plan:   Principal Problem:   Intractable low back pain Active Problems:   Depression   Essential hypertension   Chronic low back pain   Closed wedge compression fracture of L1 vertebra (HCC)   Ambulatory dysfunction   L1 vertebral fracture (HCC)   Dehydration  Principal Problem:   Intractable low back pain   Subacute closed wedge compression fracture of L1  vertebra (HCC) with 4 mm retropulsion without spinal stenosis   Ambulatory dysfunction   Chronic low back pain, moderate to severe spinal stenosis -Patient presents with a 2 to 3-day history of worsening low back pain impairing her ability to ambulate but without bowel or bladder problems or saddle anesthesia.   -CT on presentation showing subacute L1 vertebral fracture with 4 mm retropulsion without associated spinal stenosis.  30% height loss -Brace ordered from the emergency room -emergency room provider spoke with neurosurgery who recommended brace and follow-up in 1 week with Dr. Ronnald Ramp  -On multiple PO as well as IV narcotics overnight -NCCSR reviewed. No recent outpatient opioids have been prescribed -Currently on oxycodone q4hrs. Now on oxycontin 20mg  q12hrs. Continued on neurontin at night. Have since added home TID lyrica today -Patient continues to report marked pain, asking to not participate with PT -Pt understands goal is to not to achieve 0/10 pain, but instead to make pain more tolerable to allow physical therapy -See below. Have scheduled bowel regimen    Depression/anxiety -Continue alprazolam as tolerated    Essential hypertension -Recently hypotensive requiring IVF boluses -BP later improved after verapamil stopped -Will continue lower dose verapamil to allow pt to tolerate analgesia  Dehydration -Improved with IVF hydration -cont to follow renal function  Bladder outlet obstruction - Over 700cc noted on bladder US with nearly 1L out via I/O cath just after admit -Now voiding well since oxybutinin was held  Constipation -Pt and family report no bowel movement in over  one week, likely made worse with narcotic -Failed Mg citrate, bowel prep miralax and stool softner -Will give trial of SMOG enema with lactulose  DVT prophylaxis: Lovenox subq Code Status: DNR Family Communication: Pt in room, family not at bedside  Status is: Inpatient  The patient will  require care spanning > 2 midnights and should be moved to inpatient because: Ongoing active pain requiring inpatient pain management and Unsafe d/c plan  Dispo: The patient is from: Home              Anticipated d/c is to: SNF              Anticipated d/c date is: 2 days              Patient currently is not medically stable to d/c.   Consultants:   Neurosurgery per EDP  Procedures:     Antimicrobials: Anti-infectives (From admission, onward)   None      Subjective: Still with back pain, not wanting to work with therapy  Objective: Vitals:   04/10/20 0311 04/10/20 0834 04/10/20 1228 04/10/20 1435  BP: (!) 156/75 (!) 116/58 131/72 (!) 152/75  Pulse: 84 83 88 93  Resp:  18 20 19   Temp: 98.5 F (36.9 C) 98.2 F (36.8 C)  99 F (37.2 C)  TempSrc: Oral Oral    SpO2:  93% 93% 94%  Weight:      Height:        Intake/Output Summary (Last 24 hours) at 04/10/2020 1608 Last data filed at 04/10/2020 1300 Gross per 24 hour  Intake 720 ml  Output 1300 ml  Net -580 ml   Filed Weights   04/04/20 2018  Weight: 71.8 kg    Examination: General exam: Awake, sitting upright with therapy in room, comfortable but at times grimacing in pain Respiratory system: Normal respiratory effort, no wheezing Cardiovascular system: regular rate, s1, s2 Gastrointestinal system: Soft, nondistended, positive BS Central nervous system: CN2-12 grossly intact, strength intact Extremities: Perfused, no clubbing Skin: Normal skin turgor, no notable skin lesions seen Psychiatry: Mood normal // no visual hallucinations   Data Reviewed: I have personally reviewed following labs and imaging studies  CBC: Recent Labs  Lab 04/04/20 1604  WBC 7.3  NEUTROABS 4.7  HGB 13.7  HCT 41.1  MCV 93.6  PLT 510   Basic Metabolic Panel: Recent Labs  Lab 04/04/20 1604 04/05/20 0624 04/07/20 0656  NA 138  --  132*  K 4.0  --  4.0  CL 105  --  99  CO2 21*  --  25  GLUCOSE 104*  --  122*  BUN 8  --   6*  CREATININE 0.81  --  0.67  CALCIUM 9.2  --  8.7*  MG  --  1.9  --    GFR: Estimated Creatinine Clearance: 49.5 mL/min (by C-G formula based on SCr of 0.67 mg/dL). Liver Function Tests: Recent Labs  Lab 04/07/20 0656  AST 20  ALT 17  ALKPHOS 44  BILITOT 0.5  PROT 5.6*  ALBUMIN 2.5*   No results for input(s): LIPASE, AMYLASE in the last 168 hours. No results for input(s): AMMONIA in the last 168 hours. Coagulation Profile: No results for input(s): INR, PROTIME in the last 168 hours. Cardiac Enzymes: No results for input(s): CKTOTAL, CKMB, CKMBINDEX, TROPONINI in the last 168 hours. BNP (last 3 results) No results for input(s): PROBNP in the last 8760 hours. HbA1C: No results for input(s): HGBA1C in the last  72 hours. CBG: No results for input(s): GLUCAP in the last 168 hours. Lipid Profile: No results for input(s): CHOL, HDL, LDLCALC, TRIG, CHOLHDL, LDLDIRECT in the last 72 hours. Thyroid Function Tests: No results for input(s): TSH, T4TOTAL, FREET4, T3FREE, THYROIDAB in the last 72 hours. Anemia Panel: No results for input(s): VITAMINB12, FOLATE, FERRITIN, TIBC, IRON, RETICCTPCT in the last 72 hours. Sepsis Labs: No results for input(s): PROCALCITON, LATICACIDVEN in the last 168 hours.  Recent Results (from the past 240 hour(s))  SARS Coronavirus 2 by RT PCR (hospital order, performed in [facility] hospital lab) Nasopharyngeal Nasopharyngeal Swab     Status: None   Collection Time: 04/04/20  8:00 PM   Specimen: Nasopharyngeal Swab  Result Value Ref Range Status   SARS Coronavirus 2 NEGATIVE NEGATIVE Final    Comment: (NOTE) SARS-CoV-2 target nucleic acids are NOT DETECTED.  The SARS-CoV-2 RNA is generally detectable in upper and lower respiratory specimens during the acute phase of infection. The lowest concentration of SARS-CoV-2 viral copies this assay can detect is 250 copies / mL. A negative result does not preclude SARS-CoV-2 infection and should not be  used as the sole basis for treatment or other patient management decisions.  A negative result may occur with improper specimen collection / handling, submission of specimen other than nasopharyngeal swab, presence of viral mutation(s) within the areas targeted by this assay, and inadequate number of viral copies (<250 copies / mL). A negative result must be combined with clinical observations, patient history, and epidemiological information.  Fact Sheet for Patients:   StrictlyIdeas.no  Fact Sheet for Healthcare Providers: BankingDealers.co.za  This test is not yet approved or  cleared by the [place] FDA and has been authorized for detection and/or diagnosis of SARS-CoV-2 by FDA under an Emergency Use Authorization (EUA).  This EUA will remain in effect (meaning this test can be used) for the duration of the COVID-19 declaration under Section 564(b)(1) of the Act, 21 U.S.C. section 360bbb-3(b)(1), unless the authorization is terminated or revoked sooner.  Performed at [facility], [place] [address], [place], [place] [address]      Radiology Studies: No results found.  Scheduled Meds: . docusate sodium  100 mg Oral BID  . enoxaparin (LOVENOX) injection  40 mg Subcutaneous Q24H  . gabapentin  300 mg Oral QHS  . magnesium oxide  200 mg Oral Daily  . oxyCODONE  20 mg Oral Q12H  . pantoprazole  20 mg Oral Daily  . pregabalin  50 mg Oral TID  . verapamil  40 mg Oral Q12H   Continuous Infusions:    LOS: 5 days   Marylu Lund, MD [facility] Pager On Amion  If 7PM-7AM, please contact night-coverage 04/10/2020, 4:08 PM

## 2020-04-10 NOTE — Evaluation (Signed)
Occupational Therapy Evaluation Patient Details Name: Tracey Armstrong MRN: [id number] DOB: 06/06/1935 Today's Date: 04/10/2020    History of Present Illness Pt is an 85 y/o female admitted secondary to worsening back pain. Pt found to have subacute L1 compression fx. Being managed with TLSO. PMH includes HTN and chronic back pain.    Clinical Impression   Pt admitted with above. She demonstrates the below listed deficits and will benefit from continued OT to maximize safety and independence with BADLs.  Pt seen in conjunction with PT.  She requires max A +2, progressing to mod A +2 for all aspects of mobility, and min A - total A for ADLs.  She is limited by pain and increased anxiety.  She lives alone at [facility].   Recommend SNF level rehab at discharge to allow her to maximize independence and safety with ADLs and functional mobility.       Follow Up Recommendations  SNF    Equipment Recommendations  3 in 1 bedside commode    Recommendations for Other Services       Precautions / Restrictions Precautions Precautions: Back;Fall Precaution Booklet Issued: No Precaution Comments: Verbally reviewed back precautions.  Required Braces or Orthoses: Spinal Brace Spinal Brace: Thoracolumbosacral orthotic Restrictions Weight Bearing Restrictions: No      Mobility Bed Mobility Overal bed mobility: Needs Assistance Bed Mobility: Rolling;Sidelying to Sit;Sit to Supine Rolling: Max assist Sidelying to sit: Max assist;+2 for physical assistance;+2 for safety/equipment   Sit to supine: Max assist;+2 for physical assistance   General bed mobility comments: Max encouragement and cues. She required assist with all aspects of movement   Transfers Overall transfer level: Needs assistance Equipment used: 2 person hand held assist Transfers: Sit to/from [employer] Transfers Sit to Stand: Max assist;+2 physical assistance;+2 safety/equipment Stand pivot  transfers: Max assist;+2 safety/equipment;+2 physical assistance       General transfer comment: Pt requires assist to move into standing and assist to shift weight and step to chair.  Pt very fearful of moving and of increased pain     Balance Overall balance assessment: Needs assistance Sitting-balance support: Bilateral upper extremity supported;Feet supported Sitting balance-Leahy Scale: Poor Sitting balance - Comments: pt required maxA initially to maintain balance with significant R lateral lean as L hip/thigh is whats painful. Pt then transitioned to min/modA with support only on R side not posteriorly   Standing balance support: Bilateral upper extremity supported Standing balance-Leahy Scale: Poor Standing balance comment: Pt requires max A +2 progressing to mod A +2 for short duration static standing                            ADL either performed or assessed with clinical judgement   ADL Overall ADL's : Needs assistance/impaired Eating/Feeding: Minimal assistance;Sitting   Grooming: Wash/dry hands;Wash/dry face;Oral care;Brushing hair;Maximal assistance;Sitting Grooming Details (indicate cue type and reason): Pt demonstrates difficulty unweighting UEs to participate in ADLs  Upper Body Bathing: Maximal assistance;Sitting;Bed level   Lower Body Bathing: Total assistance;Sit to/from stand   Upper Body Dressing : Maximal assistance;Sitting   Lower Body Dressing: Total assistance;Sit to/from stand   Toilet Transfer: Maximal assistance;+2 for physical assistance;+2 for safety/equipment;Stand-pivot;Comfort height toilet   Toileting- Clothing Manipulation and Hygiene: Total assistance;Sit to/from stand       Functional mobility during ADLs: Maximal assistance;+2 for physical assistance;+2 for safety/equipment       Vision  Perception     Praxis      Pertinent Vitals/Pain Pain Assessment: 0-10 Pain Score: 8  Faces Pain Scale: Hurts whole  lot Pain Location: radiating pain down both Legs, L worse than R Pain Descriptors / Indicators: Grimacing;Guarding;Moaning;Radiating Pain Intervention(s): Monitored during session     Hand Dominance Right   Extremity/Trunk Assessment Upper Extremity Assessment Upper Extremity Assessment: Generalized weakness   Lower Extremity Assessment Lower Extremity Assessment: Defer to PT evaluation   Cervical / Trunk Assessment Cervical / Trunk Assessment: Other exceptions Cervical / Trunk Exceptions: L1 compression fx.    Communication Communication Communication: No difficulties   Cognition Arousal/Alertness: Awake/alert Behavior During Therapy: Anxious Overall Cognitive Status: Within Functional Limits for tasks assessed                                 General Comments: Pt very distracted by pain, but able to redirect.  Cognition WFL for basic tasks , pt appears self limiting   General Comments  VSS, easily distracted    Exercises     Shoulder Instructions      Home Living Family/patient expects to be discharged to:: Other (Comment) (independent living at [facility] ) Living Arrangements: Alone   Type of Home: Independent living facility Home Access: [place]: One level     Bathroom Shower/Tub: Teacher, early years/pre: Handicapped height     Home Equipment: None   Additional Comments: At [facility] ILF       Prior Functioning/Environment Level of Independence: Independent;Independent with assistive device(s)        Comments: Per son, pt utilized RW         OT Problem List: Decreased activity tolerance;Impaired balance (sitting and/or standing);Decreased safety awareness;Decreased knowledge of use of DME or AE;Pain;Decreased knowledge of precautions      OT Treatment/Interventions: DME and/or AE instruction;Self-care/ADL training;Therapeutic activities;Patient/family education;Balance training    OT  Goals(Current goals can be found in the care plan section) Acute Rehab OT Goals Patient Stated Goal: to decrease pain  OT Goal Formulation: With patient Time For Goal Achievement: 04/24/20 Potential to Achieve Goals: Good ADL Goals Pt Will Perform Grooming: with min assist;standing Pt Will Perform Upper Body Bathing: with set-up;with supervision;sitting Pt Will Perform Lower Body Bathing: with mod assist;with adaptive equipment;sit to/from stand Pt Will Perform Upper Body Dressing: with supervision;with set-up;sitting Pt Will Perform Lower Body Dressing: with mod assist;sit to/from stand Pt Will Transfer to Toilet: with min assist;stand pivot transfer;bedside commode Pt Will Perform Toileting - Clothing Manipulation and hygiene: with min assist;sit to/from stand  OT Frequency: Min 2X/week   Barriers to D/C: Decreased caregiver support          Co-evaluation PT/OT/SLP Co-Evaluation/Treatment: Yes Reason for Co-Treatment: For patient/therapist safety;Complexity of the patient's impairments (multi-system involvement) PT goals addressed during session: Mobility/safety with mobility OT goals addressed during session: ADL's and self-care      AM-PAC OT "6 Clicks" Daily Activity     Outcome Measure Help from another person eating meals?: A Little Help from another person taking care of personal grooming?: A Lot Help from another person toileting, which includes using toliet, bedpan, or urinal?: A Lot Help from another person bathing (including washing, rinsing, drying)?: A Lot Help from another person to put on and taking off regular upper body clothing?: A Lot Help from another person to put on and taking off regular lower  body clothing?: Total 6 Click Score: 12   End of Session Equipment Utilized During Treatment: Back brace;Gait belt Nurse Communication: Mobility status  Activity Tolerance: Patient limited by pain Patient left: in chair;with call bell/phone within reach;with bed  alarm set;with family/visitor present;with nursing/sitter in room  OT Visit Diagnosis: Pain Pain - part of body:  (back )                Time: 2536-6440 OT Time Calculation (min): 66 min Charges:  OT General Charges $OT Visit: 1 Visit  Nilsa Nutting., OTR/L Acute Rehabilitation Services Pager [phone] Office [phone]   Lucille Passy M 04/10/2020, 3:56 PM

## 2020-04-10 NOTE — Progress Notes (Signed)
Occupational Therapy Evaluation Addendum    04/10/20 1300  OT Time Calculation  OT Start Time (ACUTE ONLY) 1114  OT Stop Time (ACUTE ONLY) 1220  OT Time Calculation (min) 66 min  OT General Charges  $OT Visit 1 Visit  OT Evaluation  $OT Eval Moderate Complexity 1 Mod  OT Treatments  $Therapeutic Activity 8-22 mins  Nilsa Nutting., OTR/L Acute Rehabilitation Services Pager [phone] Office [phone]

## 2020-04-10 NOTE — Progress Notes (Signed)
Physical Therapy Treatment Patient Details Name: Tracey Armstrong MRN: [id number] DOB: [birth year] Today's Date: 04/10/2020    History of Present Illness Pt is an 85 y/o female admitted secondary to worsening back pain. Pt found to have subacute L1 compression fx. Being managed with TLSO. PMH includes HTN and chronic back pain.     PT Comments    Pt seen with OT today to optimize patient participation and maximize mobility progression. Despite report of 10/10 pain in back and L hip/thigh pt was able to sit EOB x 15 min and complete std pvt transfer to chair with 2 person assist. Pt requiring max encouragement to participate and progress OOB mobility. Pt educated on importance of OOB mobility. Dr. Wyline Copas also support PT/OT and the importance of mobility. Dr. Wyline Copas did start pt back on lyrica per pt request to assist with pain management. Pt continues to be very anxious with a fear of falling and increase in pain with mobility. Pt encouraged to sit up in chair for atleast and hour, Son present and on board with this plan. PT to continue to follow and progress mobility.    Follow Up Recommendations  SNF;Supervision/Assistance - 24 hour     Equipment Recommendations  Other (comment)    Recommendations for Other Services       Precautions / Restrictions Precautions Precautions: Back;Fall Precaution Booklet Issued: No Precaution Comments: Verbally reviewed back precautions.  Required Braces or Orthoses: Spinal Brace Spinal Brace: Thoracolumbosacral orthotic Restrictions Weight Bearing Restrictions: No    Mobility  Bed Mobility Overal bed mobility: Needs Assistance Bed Mobility: Rolling;Sidelying to Sit;Sit to Supine Rolling: Max assist Sidelying to sit: Max assist;+2 for physical assistance;+2 for safety/equipment   Sit to supine: Max assist;+2 for physical assistance   General bed mobility comments: Max encouragement and cues. She required assist with all aspects of movement    Transfers Overall transfer level: Needs assistance Equipment used: 2 person hand held assist Transfers: Sit to/from [employer] Transfers Sit to Stand: Max assist;+2 physical assistance;+2 safety/equipment Stand pivot transfers: Max assist;+2 safety/equipment;+2 physical assistance       General transfer comment: Pt requires assist to move into standing and assist to shift weight and step to chair.  Pt very fearful of moving and of increased pain   Ambulation/Gait             General Gait Details: unable at this time   Stairs             Wheelchair Mobility    Modified Rankin (Stroke Patients Only)       Balance Overall balance assessment: Needs assistance Sitting-balance support: Bilateral upper extremity supported;Feet supported Sitting balance-Leahy Scale: Poor Sitting balance - Comments: pt required maxA initially to maintain balance with significant R lateral lean as L hip/thigh is whats painful. Pt then transitioned to min/modA with support only on R side not posteriorly   Standing balance support: Bilateral upper extremity supported Standing balance-Leahy Scale: Poor Standing balance comment: Pt requires max A +2 progressing to mod A +2 for short duration static standing                             Cognition Arousal/Alertness: Awake/alert Behavior During Therapy: Anxious Overall Cognitive Status: Within Functional Limits for tasks assessed  General Comments: Pt very distracted by pain, but able to redirect.  Cognition WFL for basic tasks , pt appears self limiting      Exercises      General Comments General comments (skin integrity, edema, etc.): VSS, easily distracted      Pertinent Vitals/Pain Pain Assessment: 0-10 Pain Score: 8  Faces Pain Scale: Hurts whole lot Pain Location: radiating pain down both Legs, L worse than R Pain Descriptors / Indicators:  Grimacing;Guarding;Moaning;Radiating Pain Intervention(s): Monitored during session    Home Living Family/patient expects to be discharged to:: Other (Comment) (independent living at [facility] ) Living Arrangements: Alone   Type of Home: Independent living facility Home Access: Elevator   Home Layout: One level Home Equipment: None Additional Comments: At [facility]     Prior Function Level of Independence: Independent;Independent with assistive device(s)      Comments: Per son, pt utilized RW    PT Goals (current goals can now be found in the care plan section) Acute Rehab PT Goals Patient Stated Goal: to decrease pain  Progress towards PT goals: Progressing toward goals    Frequency    Min 3X/week      PT Plan Current plan remains appropriate    Co-evaluation PT/OT/SLP Co-Evaluation/Treatment: Yes Reason for Co-Treatment: For patient/therapist safety;Complexity of the patient's impairments (multi-system involvement) PT goals addressed during session: Mobility/safety with mobility        AM-PAC PT "6 Clicks" Mobility   Outcome Measure  Help needed turning from your back to your side while in a flat bed without using bedrails?: A Lot Help needed moving from lying on your back to sitting on the side of a flat bed without using bedrails?: A Lot Help needed moving to and from a bed to a chair (including a wheelchair)?: A Lot Help needed standing up from a chair using your arms (e.g., wheelchair or bedside chair)?: A Lot Help needed to walk in hospital room?: Total Help needed climbing 3-5 steps with a railing? : Total 6 Click Score: 10    End of Session   Activity Tolerance: Patient limited by pain Patient left: in bed;with call bell/phone within reach;with nursing/sitter in room Nurse Communication: Mobility status PT Visit Diagnosis: Unsteadiness on feet (R26.81);Muscle weakness (generalized) (M62.81);Difficulty in walking, not elsewhere classified  (R26.2)     Time: 3343-5686 PT Time Calculation (min) (ACUTE ONLY): 66 min  Charges:  $Gait Training: 8-22 mins $Therapeutic Activity: 8-22 mins                     Kittie Plater, PT, DPT Acute Rehabilitation Services Pager #: [phone] Office #: [phone]    Berline Lopes 04/10/2020, 2:43 PM

## 2020-04-11 ENCOUNTER — Inpatient Hospital Stay (HOSPITAL_COMMUNITY): Payer: Medicare Other

## 2020-04-11 LAB — COMPREHENSIVE METABOLIC PANEL
ALT: 45 U/L — ABNORMAL HIGH (ref 0–44)
AST: 34 U/L (ref 15–41)
Albumin: 2.3 g/dL — ABNORMAL LOW (ref 3.5–5.0)
Alkaline Phosphatase: 54 U/L (ref 38–126)
Anion gap: 10 (ref 5–15)
BUN: 9 mg/dL (ref 8–23)
CO2: 27 mmol/L (ref 22–32)
Calcium: 9 mg/dL (ref 8.9–10.3)
Chloride: 98 mmol/L (ref 98–111)
Creatinine, Ser: 0.65 mg/dL (ref 0.44–1.00)
GFR calc Af Amer: >60 mL/min (ref >60)
GFR calc non Af Amer: >60 mL/min (ref >60)
Glucose, Bld: 112 mg/dL — ABNORMAL HIGH (ref 70–99)
Potassium: 4.1 mmol/L (ref 3.5–5.1)
Sodium: 135 mmol/L (ref 135–145)
Total Bilirubin: 0.5 mg/dL (ref 0.3–1.2)
Total Protein: 5.4 g/dL — ABNORMAL LOW (ref 6.5–8.1)

## 2020-04-11 LAB — CBC
HCT: 35.4 % — ABNORMAL LOW (ref 36.0–46.0)
Hemoglobin: 12 g/dL (ref 12.0–15.0)
MCH: 31.1 pg (ref 26.0–34.0)
MCHC: 33.9 g/dL (ref 30.0–36.0)
MCV: 91.7 fL (ref 80.0–100.0)
Platelets: 323 10*3/uL (ref 150–400)
RBC: 3.86 MIL/uL — ABNORMAL LOW (ref 3.87–5.11)
RDW: 11.8 % (ref 11.5–15.5)
WBC: 5 10*3/uL (ref 4.0–10.5)
nRBC: 0 % (ref 0.0–0.2)

## 2020-04-11 NOTE — Progress Notes (Signed)
PROGRESS NOTE    Tracey Armstrong  DZH:[id number] DOB: September 18, 1935 DOA: 04/04/2020 PCP: Martin[place], [name], MD    Brief Narrative:  85 y.o. female with medical history significant for Hypertension, depression and chronic low back pain who presents to the emergency room with a 3-day history of worsening lower back pain and bilateral hip pain to the point where she is unable to get out of bed and ambulate at her baseline.  She denies recent fall or injury.  The pain is getting increasingly worse and is affecting her ability to manage independently as per her baseline.  The pain is aching and of moderate to severe intensity and does not radiate.  She denies bowel or bladder difficulties and denies numbness in the groin and inner thigh area. ED Course: On arrival to the emergency room vitals were within normal limits.  Her blood work was not remarkable.  She initially had an x-ray of the lumbar spine and pelvis showed an L1 compression fracture likely chronic.  Patient was treated with IV narcotics but continued to have intractable pain.  She subsequently had a CTA of the LS spine that showed possibly subacute L1 compression fracture with 30% height loss and 4 mm retropulsion.  No associated spinal stenosis.  So showed chronic mild to moderate spinal stenosis L3-4 and moderate to severe L4-5.  Neurosurgery was consulted from the emergency room.  Recommendations pending at the time of admission.  TLSO brace ordered.  Admission requested due to unsafe discharge to current level of care at her facility wellness for pain management..  Assessment & Plan:   Principal Problem:   Intractable low back pain Active Problems:   Depression   Essential hypertension   Chronic low back pain   Closed wedge compression fracture of L1 vertebra (HCC)   Ambulatory dysfunction   L1 vertebral fracture (HCC)   Dehydration  Principal Problem:   Intractable low back pain   Subacute closed wedge compression fracture of L1  vertebra (HCC) with 4 mm retropulsion without spinal stenosis   Ambulatory dysfunction   Chronic low back pain, moderate to severe spinal stenosis -Patient presents with a 2 to 3-day history of worsening low back pain impairing her ability to ambulate but without bowel or bladder problems or saddle anesthesia.   -CT on presentation showing subacute L1 vertebral fracture with 4 mm retropulsion without associated spinal stenosis.  30% height loss -Brace ordered from the emergency room after consulting with neurosurgery. -On multiple PO as well as IV narcotics overnight but patient's pain remains uncontrolled. -Currently on oxycodone q4hrs. Now on oxycontin 20mg  q12hrs. Continued on neurontin at night. Have since added home TID lyrica today -Pt understands goal is to not to achieve 0/10 pain, but instead to make pain more tolerable to allow physical therapy.  She was finally seen in person by neurosurgery today.  They have repeated her x-rays and reviewed them (although official results from radiology are not available to me yet) and they once again recommended continue to brace, continue to work with PT and follow-up as outpatient.  Patient has had no improvement in her pain at all.  She was complaining of pain in the back with me passively moving her legs.  Depression/anxiety -Continue alprazolam as tolerated    Essential hypertension -Recently hypotensive requiring IVF boluses -BP later improved after verapamil stopped -Will continue lower dose verapamil to allow pt to tolerate analgesia.  Dehydration -Improved with IVF hydration -cont to follow renal function  Bladder outlet obstruction -  Over 700cc noted on bladder US with nearly 1L out via I/O cath just after admit -Now voiding well since oxybutinin was held  Constipation Resolved.  She has had 2 bowel movements in last 24 hours.  DVT prophylaxis: Lovenox subq Code Status: DNR Family Communication: Pt in room, family not at  bedside  Status is: Inpatient  The patient will require care spanning > 2 midnights and should be moved to inpatient because: Ongoing active pain requiring inpatient pain management and Unsafe d/c plan  Dispo: The patient is from: Home              Anticipated d/c is to: SNF              Anticipated d/c date is: 1 to 2 days, TOC working with family to finalize a facility twice and other logistics.              Patient currently is medically stable to d/c.   Consultants:   Neurosurgery per EDP  Procedures:     Antimicrobials: Anti-infectives (From admission, onward)   None      Subjective: Patient seen and examined.  Continues to complain of low back pain.  No other complaint.  She started crying when I started to lift her legs only 5 degrees.  Objective: Vitals:   04/11/20 0021 04/11/20 0354 04/11/20 0756 04/11/20 1237  BP: 137/65 (!) 104/47 (!) 154/59 133/66  Pulse: 91 62 93 90  Resp: 18 14 16 20   Temp: 99.1 F (37.3 C) 98.3 F (36.8 C) 98.1 F (36.7 C) 99.8 F (37.7 C)  TempSrc: Oral Oral Oral Oral  SpO2: 95% 98% 93% 94%  Weight:      Height:        Intake/Output Summary (Last 24 hours) at 04/11/2020 1239 Last data filed at 04/10/2020 1900 Gross per 24 hour  Intake 360 ml  Output 700 ml  Net -340 ml   Filed Weights   04/04/20 2018  Weight: 71.8 kg    Examination: General exam: Appears calm and comfortable  Respiratory system: Clear to auscultation. Respiratory effort normal. Cardiovascular system: S1 & S2 heard, RRR. No JVD, murmurs, rubs, gallops or clicks. No pedal edema. Gastrointestinal system: Abdomen is nondistended, soft and nontender. No organomegaly or masses felt. Normal bowel sounds heard. Central nervous system: Alert and oriented.  4/5 power in bilateral lower extremities Extremities: Symmetric 5 x 5 power. Skin: No rashes, lesions or ulcers.  Psychiatry: Judgement and insight appear normal. Mood & affect appropriate.    Data Reviewed:  I have personally reviewed following labs and imaging studies  CBC: Recent Labs  Lab 04/04/20 1604 04/11/20 0538  WBC 7.3 5.0  NEUTROABS 4.7  --   HGB 13.7 12.0  HCT 41.1 35.4*  MCV 93.6 91.7  PLT 219 235   Basic Metabolic Panel: Recent Labs  Lab 04/04/20 1604 04/05/20 0624 04/07/20 0656 04/11/20 0538  NA 138  --  132* 135  K 4.0  --  4.0 4.1  CL 105  --  99 98  CO2 21*  --  25 27  GLUCOSE 104*  --  122* 112*  BUN 8  --  6* 9  CREATININE 0.81  --  0.67 0.65  CALCIUM 9.2  --  8.7* 9.0  MG  --  1.9  --   --    GFR: Estimated Creatinine Clearance: 49.5 mL/min (by C-G formula based on SCr of 0.65 mg/dL). Liver Function Tests: Recent Labs  Lab  04/07/20 0656 04/11/20 0538  AST 20 34  ALT 17 45*  ALKPHOS 44 54  BILITOT 0.5 0.5  PROT 5.6* 5.4*  ALBUMIN 2.5* 2.3*   No results for input(s): LIPASE, AMYLASE in the last 168 hours. No results for input(s): AMMONIA in the last 168 hours. Coagulation Profile: No results for input(s): INR, PROTIME in the last 168 hours. Cardiac Enzymes: No results for input(s): CKTOTAL, CKMB, CKMBINDEX, TROPONINI in the last 168 hours. BNP (last 3 results) No results for input(s): PROBNP in the last 8760 hours. HbA1C: No results for input(s): HGBA1C in the last 72 hours. CBG: No results for input(s): GLUCAP in the last 168 hours. Lipid Profile: No results for input(s): CHOL, HDL, LDLCALC, TRIG, CHOLHDL, LDLDIRECT in the last 72 hours. Thyroid Function Tests: No results for input(s): TSH, T4TOTAL, FREET4, T3FREE, THYROIDAB in the last 72 hours. Anemia Panel: No results for input(s): VITAMINB12, FOLATE, FERRITIN, TIBC, IRON, RETICCTPCT in the last 72 hours. Sepsis Labs: No results for input(s): PROCALCITON, LATICACIDVEN in the last 168 hours.  Recent Results (from the past 240 hour(s))  SARS Coronavirus 2 by RT PCR (hospital order, performed in [facility] hospital lab) Nasopharyngeal Nasopharyngeal Swab     Status: None   Collection  Time: 04/04/20  8:00 PM   Specimen: Nasopharyngeal Swab  Result Value Ref Range Status   SARS Coronavirus 2 NEGATIVE NEGATIVE Final    Comment: (NOTE) SARS-CoV-2 target nucleic acids are NOT DETECTED.  The SARS-CoV-2 RNA is generally detectable in upper and lower respiratory specimens during the acute phase of infection. The lowest concentration of SARS-CoV-2 viral copies this assay can detect is 250 copies / mL. A negative result does not preclude SARS-CoV-2 infection and should not be used as the sole basis for treatment or other patient management decisions.  A negative result may occur with improper specimen collection / handling, submission of specimen other than nasopharyngeal swab, presence of viral mutation(s) within the areas targeted by this assay, and inadequate number of viral copies (<250 copies / mL). A negative result must be combined with clinical observations, patient history, and epidemiological information.  Fact Sheet for Patients:   StrictlyIdeas.no  Fact Sheet for Healthcare Providers: BankingDealers.co.za  This test is not yet approved or  cleared by the [place] FDA and has been authorized for detection and/or diagnosis of SARS-CoV-2 by FDA under an Emergency Use Authorization (EUA).  This EUA will remain in effect (meaning this test can be used) for the duration of the COVID-19 declaration under Section 564(b)(1) of the Act, 21 U.S.C. section 360bbb-3(b)(1), unless the authorization is terminated or revoked sooner.  Performed at [facility], [place] [address], [place], [place] [address]      Radiology Studies: No results found.  Scheduled Meds: . docusate sodium  100 mg Oral BID  . enoxaparin (LOVENOX) injection  40 mg Subcutaneous Q24H  . gabapentin  300 mg Oral QHS  . magnesium oxide  200 mg Oral Daily  . oxyCODONE  20 mg Oral Q12H  . pantoprazole  20 mg Oral Daily  . pregabalin  50 mg  Oral TID  . verapamil  40 mg Oral Q12H   Continuous Infusions:    LOS: 6 days   Darliss Cheney, MD [facility] Pager On Amion  If 7PM-7AM, please contact night-coverage 04/11/2020, 12:39 PM

## 2020-04-11 NOTE — Progress Notes (Signed)
Physical Therapy Treatment Patient Details Name: Tracey Armstrong MRN: [id number] DOB: 1935/05/10 Today's Date: 04/11/2020    History of Present Illness Pt is an 85 y/o female admitted secondary to worsening back pain. Pt found to have subacute L1 compression fx. Being managed with TLSO. PMH includes HTN and chronic back pain.     PT Comments    Pt with improve activity tolerance today and amb 50' with RW with modAx2. Pt with improved ability to move L LE on own and was able to sit EOB without external support today. Pt remains in 8/10 pain but did participate and amb today with PT. Pt strongly desires to return to [facility] not SNF and is motivated to participate and progress to do so. Acute PT to cont to follow.    Follow Up Recommendations  SNF;Supervision/Assistance - 24 hour     Equipment Recommendations       Recommendations for Other Services       Precautions / Restrictions Precautions Precautions: Back;Fall Precaution Booklet Issued: No Precaution Comments: Verbally reviewed back precautions.  Required Braces or Orthoses: Spinal Brace Spinal Brace: Thoracolumbosacral orthotic Restrictions Weight Bearing Restrictions: No    Mobility  Bed Mobility Overal bed mobility: Needs Assistance Bed Mobility: Rolling;Sidelying to Sit Rolling: Mod assist Sidelying to sit: Max assist;+2 for physical assistance       General bed mobility comments: pt max directional verbal cues, pt able to move L LE without assist and use L UE to pull self over with bed rail, maxA for trunk elevation. pt able to sit EOB with bilat UE support but no physical external support  Transfers Overall transfer level: Needs assistance Equipment used: Rolling walker (2 wheeled) Transfers: Sit to/from Stand Sit to Stand: Mod assist;Max assist;+2 physical assistance         General transfer comment: pt requires assist to power up and steady during transition of hands from bed to  RW  Ambulation/Gait Ambulation/Gait assistance: Mod assist;+2 safety/equipment Gait Distance (Feet): 50 Feet Assistive device: Rolling walker (2 wheeled) Gait Pattern/deviations: Step-through pattern;Decreased stride length;Trendelenburg Gait velocity: slow Gait velocity interpretation: <1.8 ft/sec, indicate of risk for recurrent falls General Gait Details: pt sequenced L foot, R foot, modA to maintain forward propelling of RW, max encouragement provided. Pt very dependent on RW, 2nd person for chair follow/line management   Stairs             Wheelchair Mobility    Modified Rankin (Stroke Patients Only)       Balance Overall balance assessment: Needs assistance Sitting-balance support: Bilateral upper extremity supported;Feet supported Sitting balance-Leahy Scale: Poor Sitting balance - Comments: pt dependent on bilat UE support but no longer requires external support   Standing balance support: Bilateral upper extremity supported Standing balance-Leahy Scale: Poor Standing balance comment: pt dependent on RW                            Cognition Arousal/Alertness: Awake/alert Behavior During Therapy: Anxious Overall Cognitive Status: Within Functional Limits for tasks assessed                                 General Comments: pt with improved spirits and attitude today. Pt strongly desires to return to [facility] and not to SNF      Exercises      General Comments General comments (skin integrity, edema, etc.): VSS,  pt with soiled bed, pt dependent for pericare      Pertinent Vitals/Pain Pain Assessment: 0-10 Pain Score: 8  Pain Location: back pain, L LE pain is better Pain Descriptors / Indicators: Grimacing;Guarding;Moaning;Radiating Pain Intervention(s): Monitored during session    Home Living                      Prior Function            PT Goals (current goals can now be found in the care plan section)  Progress towards PT goals: Progressing toward goals    Frequency    Min 3X/week      PT Plan Current plan remains appropriate    Co-evaluation              AM-PAC PT "6 Clicks" Mobility   Outcome Measure  Help needed turning from your back to your side while in a flat bed without using bedrails?: A Little Help needed moving from lying on your back to sitting on the side of a flat bed without using bedrails?: A Lot Help needed moving to and from a bed to a chair (including a wheelchair)?: A Lot Help needed standing up from a chair using your arms (e.g., wheelchair or bedside chair)?: A Lot Help needed to walk in hospital room?: A Lot Help needed climbing 3-5 steps with a railing? : Total 6 Click Score: 12    End of Session Equipment Utilized During Treatment: Gait belt;Back brace Activity Tolerance: Patient tolerated treatment well Patient left: in chair;with call bell/phone within reach;with chair alarm set Nurse Communication: Mobility status PT Visit Diagnosis: Unsteadiness on feet (R26.81);Muscle weakness (generalized) (M62.81);Difficulty in walking, not elsewhere classified (R26.2)     Time: 5329-9242 PT Time Calculation (min) (ACUTE ONLY): 38 min  Charges:  $Gait Training: 23-37 mins $Therapeutic Activity: 8-22 mins                     Kittie Plater, PT, DPT Acute Rehabilitation Services Pager #: [phone] Office #: [phone]    Tracey Armstrong 04/11/2020, 1:35 PM

## 2020-04-11 NOTE — Consult Note (Signed)
Reason for Consult: L1 compression fx Referring Physician: hospitalist  Tracey Armstrong is an 85 y.o. female.   HPI:  85 year old female presented to the ED on the 16th with back pain. She was admitted to the hospitalists service for pain control. She lives at [facility] assisted living. She denies any recent falls or injuries. Denies any radicular pain numbness tingling or weakness in her legs. All of her pain resides in her lower back. She has difficulty ambulating because of the pain. She has slowly increased her activity over the last couple days. Describes her pain as severe.  Past Medical History:  Diagnosis Date  . Anxiety   . Arthritis   . Chronic insomnia 03/30/2015  . Chronic low back pain 12/02/2016  . Depression   . GERD (gastroesophageal reflux disease)   . Hiatal hernia   . High cholesterol   . Hypertelorism   . Hypertension   . Meningioma ([place])   . Migraine     Past Surgical History:  Procedure Laterality Date  . STOMACH SURGERY      Allergies  Allergen Reactions  . Dihydrotachysterol     Other reaction(s): Other (See Comments)  . Trazodone And Nefazodone Other (See Comments)    Worsens headaches  . Valium [Diazepam] Other (See Comments)    headaches  . Citalopram Other (See Comments)    Other reaction(s): Headache  . Other Other (See Comments)    "States all antidepressants cause migraines."    Social History   Tobacco Use  . Smoking status: Never Smoker  . Smokeless tobacco: Never Used  Substance Use Topics  . Alcohol use: No    Family History  Problem Relation Age of Onset  . Migraines Father   . Stroke Father   . Heart Problems Mother   . Epilepsy Brother      Review of Systems  Positive ROS: as above  All other systems have been reviewed and were otherwise negative with the exception of those mentioned in the HPI and as above.  Objective: Vital signs in last 24 hours: Temp:  [98.1 F (36.7 C)-99.9 F (37.7 C)] 98.1 F (36.7 C) (06/23  0756) Pulse Rate:  [62-109] 93 (06/23 0756) Resp:  [14-20] 16 (06/23 0756) BP: (104-154)/(47-75) 154/59 (06/23 0756) SpO2:  [93 %-98 %] 93 % (06/23 0756)  General Appearance: Alert, cooperative, no distress, appears stated age Head: Normocephalic, without obvious abnormality, atraumatic Back: Symmetric, no curvature, ROM normal, no CVA tenderness Lungs:  respirations unlabored Heart: Regular rate and rhythm Skin: Skin color, texture, turgor normal, no rashes or lesions  NEUROLOGIC:   Mental status: A&O x4, no aphasia, good attention span, Memory and fund of knowledge Motor Exam - grossly normal, normal tone and bulk Sensory Exam - grossly normal Reflexes: symmetric, no pathologic reflexes, No Hoffman's, No clonus Coordination - grossly normal Gait - grossly normal Balance - grossly normal Cranial Nerves: I: smell Not tested  II: visual acuity  OS: na    OD: na  II: visual fields Full to confrontation  II: pupils Equal, round, reactive to light  III,VII: ptosis None  III,IV,VI: extraocular muscles    V: mastication   V: facial light touch sensation    V,VII: corneal reflex    VII: facial muscle function - upper    VII: facial muscle function - lower   VIII: hearing   IX: soft palate elevation    IX,X: gag reflex   XI: trapezius strength    XI: sternocleidomastoid  strength   XI: neck flexion strength    XII: tongue strength      Data Review Lab Results  Component Value Date   WBC 5.0 04/11/2020   HGB 12.0 04/11/2020   HCT 35.4 (L) 04/11/2020   MCV 91.7 04/11/2020   PLT 323 04/11/2020   Lab Results  Component Value Date   NA 135 04/11/2020   K 4.1 04/11/2020   CL 98 04/11/2020   CO2 27 04/11/2020   BUN 9 04/11/2020   CREATININE 0.65 04/11/2020   GLUCOSE 112 (H) 04/11/2020   No results found for: INR, PROTIME  Radiology: DG Lumbar Spine Complete  Result Date: 04/04/2020 CLINICAL DATA:  Low back pain while bending, initial encounter EXAM: LUMBAR SPINE -  COMPLETE 4+ VIEW COMPARISON:  07/28/2018 FINDINGS: Five lumbar type vertebral bodies are well visualized. L1 compression deformity is noted which is new from the prior exam but appears chronic in nature. Correlation to point tenderness is recommended. Mild degenerative anterolisthesis of L4 on L5 is noted stable from prior exam. Mild facet hypertrophic changes are noted. Ingested tablets are noted in the right abdomen. No soft tissue abnormality is seen. IMPRESSION: L1 compression deformity which appears chronic but new from a prior exam of 2019. Correlation to point tenderness is recommended. Degenerative change with anterolisthesis of L4 on L5. Electronically Signed   By: Inez Catalina M.D.   On: 04/04/2020 10:23   CT Lumbar Spine Wo Contrast  Result Date: 04/04/2020 CLINICAL DATA:  Low back pain for 3 days. EXAM: CT LUMBAR SPINE WITHOUT CONTRAST TECHNIQUE: Multidetector CT imaging of the lumbar spine was performed without intravenous contrast administration. Multiplanar CT image reconstructions were also generated. COMPARISON:  Lumbar spine radiographs 04/04/2020 and MRI 09/09/2018 FINDINGS: Segmentation: 5 lumbar type vertebrae. Alignment: Mild thoracolumbar levoscoliosis. Grade 1 anterolisthesis of L4 on L5, unchanged from the prior MRI. Vertebrae: L1 superior endplate compression fracture with 30% vertebral body height loss and 4 mm retropulsion of the superior endplate. A fracture line remains visible although there is surrounding sclerosis. Hemangioma in the L4 vertebral body. Paraspinal and other soft tissues: Aortic atherosclerosis without aneurysm. Disc levels: Disc bulging and posterior element hypertrophy result in chronic mild-to-moderate spinal stenosis at L3-4 and moderate to severe spinal stenosis at L4-5. No significant spinal stenosis related to mild L1 retropulsion. IMPRESSION: 1. Possibly subacute L1 compression fracture with 30% height loss and 4 mm retropulsion. No associated spinal  stenosis. 2. Chronic mild-to-moderate spinal stenosis at L3-4 and moderate to severe spinal stenosis at L4-5. 3. Aortic Atherosclerosis (ICD10-I70.0). Electronically Signed   By: Logan Bores M.D.   On: 04/04/2020 17:39   DG HIP UNILAT WITH PELVIS 2-3 VIEWS LEFT  Result Date: 04/04/2020 CLINICAL DATA:  Left hip pain following bending, initial encounter EXAM: DG HIP (WITH OR WITHOUT PELVIS) 2V LEFT COMPARISON:  None. FINDINGS: No acute fracture or dislocation is seen. No significant degenerative changes are noted. No soft tissue abnormality is seen. IMPRESSION: No acute abnormality noted. Electronically Signed   By: Inez Catalina M.D.   On: 04/04/2020 10:24   DG HIP UNILAT WITH PELVIS 2-3 VIEWS RIGHT  Result Date: 04/04/2020 CLINICAL DATA:  Right hip pain following bending, initial encounter EXAM: DG HIP (WITH OR WITHOUT PELVIS) 3V RIGHT COMPARISON:  None. FINDINGS: Pelvic ring is intact. No acute fracture or dislocation is noted. No significant degenerative changes are seen. No soft tissue abnormality is noted. IMPRESSION: No acute abnormality noted. Electronically Signed   By: Inez Catalina  M.D.   On: 04/04/2020 10:26     Assessment/Plan: 85 year old female came in with unrelenting back pain. CT l spine showed a subacute L1 compression fracture with 30% vertebral height loss and mild retropulsion into the canal. Xrays today may show some progression of the fracture but still stable. Recommend bracing and continuing therapies. Can follow up with [place] in the office in a couple weeks with more xrays.    Ocie Cornfield [facility] 04/11/2020 12:17 PM

## 2020-04-12 LAB — SARS CORONAVIRUS 2 BY RT PCR (HOSPITAL ORDER, PERFORMED IN ~~LOC~~ HOSPITAL LAB): SARS Coronavirus 2: NEGATIVE

## 2020-04-12 NOTE — NC FL2 (Signed)
[place] MEDICAID FL2 LEVEL OF CARE SCREENING TOOL     IDENTIFICATION  Patient Name: Tracey Armstrong Birthdate: 1935-05-13 Sex: female Admission Date (Current Location): 04/04/2020  [facility] and [place] Number:  Herbalist and Address:  [facility]. [facility] [place] [address] [place], [place] [address]      Provider Number: [id number]  Attending Physician Name and Address:  Darliss [place], MD  Relative Name and Phone Number:       Current Level of Care: Hospital Recommended Level of Care: [place] Prior Approval Number:    Date Approved/Denied:   PASRR Number: [id number]  Discharge Plan: SNF    Current Diagnoses: Patient Active Problem List   Diagnosis Date Noted  . Synovial cyst of lumbar facet joint 04/05/2020  . Bilateral stenosis of lateral recess of lumbar spine 04/05/2020  . Dehydration 04/05/2020  . Closed wedge compression fracture of L1 vertebra ([place]) 04/04/2020  . Ambulatory dysfunction 04/04/2020  . Intractable low back pain 04/04/2020  . L1 vertebral fracture ([place]) 04/04/2020  . Urine, incontinence, stress female 02/07/2019  . Knee osteoarthritis 02/01/2018  . Iron deficiency anemia 10/28/2017  . Rhinitis, allergic 07/27/2017  . GERD (gastroesophageal reflux disease) 06/16/2017  . Chronic pain disorder 06/16/2017  . Vitamin D deficiency 05/25/2017  . Hyperlipidemia 05/25/2017  . 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 Pain 02/26/2017  . Chronic low back pain 12/02/2016  . Abdominal pain 03/26/2016  . 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 Peptic esophagitis 03/26/2016  . Neuralgia neuritis, sciatic nerve 03/26/2016  . 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[place]) 06/20/2015  . Chronic insomnia 03/30/2015  . Chronic migraine 01/10/2015  . Depression 04/20/2014  . Generalized anxiety disorder 04/20/2014  . Chronic back pain 04/20/2014  . Chronic daily headache 04/20/2014  . Essential hypertension 04/20/2014  . Migraine headache 04/19/2014     Orientation RESPIRATION BLADDER Height & Weight     Self, Time, Situation, Place  Normal External catheter, Continent Weight: 158 lb 4.6 oz (71.8 kg) Height:  5\' 2"  (157.5 cm)  BEHAVIORAL SYMPTOMS/MOOD NEUROLOGICAL BOWEL NUTRITION STATUS      Incontinent Diet (please see discharge summary)  AMBULATORY STATUS COMMUNICATION OF NEEDS Skin     Verbally Normal                       Personal Care Assistance Level of Assistance  Bathing, Dressing, Feeding Bathing Assistance: Limited assistance Feeding assistance: Limited assistance Dressing Assistance: Limited assistance     Functional Limitations Info  Sight, Speech, Hearing Sight Info: Adequate Hearing Info: Adequate Speech Info: Adequate    SPECIAL CARE FACTORS FREQUENCY                       Contractures Contractures Info: Not present    Additional Factors Info  Code Status, Allergies, Psychotropic Code Status Info: DNR Allergies Info: Dihydrotachysterol,Trazodone And Nefazodone,Valium,States all antidepressantscause migraines." Psychotropic Info: ALPRAZolam Duanne Moron) tablet 0.5 mg         Current Medications (04/12/2020):  This is the current hospital active medication list Current Facility-Administered Medications  Medication Dose Route Frequency Provider Last Rate Last Admin  . acetaminophen (TYLENOL) tablet 650 mg  650 mg Oral Q6H PRN Athena Masse, MD   650 mg at 04/12/20 1323   Or  . acetaminophen (TYLENOL) suppository 650 mg  650 mg Rectal Q6H PRN Athena Masse, MD      . ALPRAZolam Duanne Moron) tablet 0.5 mg  0.5  mg Oral BID PRN Athena Masse, MD   0.5 mg at 04/11/20 2147  . docusate sodium (COLACE) capsule 100 mg  100 mg Oral BID Donne Hazel, MD   100 mg at 04/11/20 2139  . gabapentin (NEURONTIN) capsule 300 mg  300 mg Oral QHS Athena Masse, MD   300 mg at 04/11/20 2139  . labetalol (NORMODYNE) injection 5 mg  5 mg Intravenous Q2H PRN Donne Hazel, MD      . lip balm (CARMEX) ointment    Topical PRN Athena Masse, MD   Given at 04/04/20 2217  . magnesium oxide (MAG-OX) tablet 200 mg  200 mg Oral Daily Athena Masse, MD   200 mg at 04/12/20 0855  . ondansetron (ZOFRAN) tablet 4 mg  4 mg Oral Q6H PRN Athena Masse, MD       Or  . ondansetron [facility]) injection 4 mg  4 mg Intravenous Q6H PRN Athena Masse, MD      . oxyCODONE (Oxy IR/ROXICODONE) immediate release tablet 5 mg  5 mg Oral Q4H PRN Donne Hazel, MD   5 mg at 04/12/20 1323  . oxyCODONE (OXYCONTIN) 12 hr tablet 20 mg  20 mg Oral Q12H Donne Hazel, MD   20 mg at 04/12/20 0855  . pantoprazole (PROTONIX) EC tablet 20 mg  20 mg Oral Daily Athena Masse, MD   20 mg at 04/12/20 0855  . polyvinyl alcohol (LIQUIFILM TEARS) 1.4 % ophthalmic solution 1 drop  1 drop Both Eyes PRN Athena Masse, MD   1 drop at 04/06/20 1557  . pregabalin (LYRICA) capsule 50 mg  50 mg Oral TID Donne Hazel, MD   50 mg at 04/12/20 0855  . senna-docusate (Senokot-S) tablet 1 tablet  1 tablet Oral QHS PRN Athena Masse, MD   1 tablet at 04/10/20 1026  . SUMAtriptan (IMITREX) tablet 100 mg  100 mg Oral PRN Athena Masse, MD      . tiZANidine (ZANAFLEX) tablet 4 mg  4 mg Oral BID PRN Athena Masse, MD   4 mg at 04/12/20 0855  . verapamil (CALAN) tablet 40 mg  40 mg Oral Q12H Donne Hazel, MD   40 mg at 04/12/20 4718     Discharge Medications: Please see discharge summary for a list of discharge medications.  Relevant Imaging Results:  Relevant Lab Results:   Additional Information SSN [id number]  Vinie Sill, LCSWA

## 2020-04-12 NOTE — Progress Notes (Addendum)
Spoke with son [name] via [place]- discussed transition of care plans and recommendations- per [name] he has not made any arrangements for private duty care 24/7 for his mom to return to her independent apartment at [facility]. Discussed recommendations made by PT here and [name] also stated that he had spoken with Rollene Fare at [facility]. At this time [name] is agreeable to STSNF placement as the safest transition plan. He request [facility] or [facility] as preference however give permission to fax out to other facilities in [place] as backup to these. TOC will f/u with [facility] for bed offers. Informed son that pt was medically stable per MD note and that once bed found and selected pt would be ready to transition in the next 1-2 days. [name] voiced his understanding.

## 2020-04-12 NOTE — Progress Notes (Signed)
Subjective: Patient reports moderate amount of back pain but resting comfortably in bed. Walked 300 feet yesterday with therapy  Objective: Vital signs in last 24 hours: Temp:  [98.2 F (36.8 C)-99.8 F (37.7 C)] 98.3 F (36.8 C) (06/24 0400) Pulse Rate:  [81-99] 85 (06/24 0400) Resp:  [16-20] 16 (06/24 0400) BP: (130-155)/(61-75) 150/75 (06/24 0400) SpO2:  [94 %-97 %] 97 % (06/24 0400)  Intake/Output from previous day: 06/23 0701 - 06/24 0700 In: -  Out: 1850 [Urine:1850] Intake/Output this shift: No intake/output data recorded.  Neurologic: Grossly normal  Lab Results: Lab Results  Component Value Date   WBC 5.0 04/11/2020   HGB 12.0 04/11/2020   HCT 35.4 (L) 04/11/2020   MCV 91.7 04/11/2020   PLT 323 04/11/2020   No results found for: INR, PROTIME BMET Lab Results  Component Value Date   NA 135 04/11/2020   K 4.1 04/11/2020   CL 98 04/11/2020   CO2 27 04/11/2020   GLUCOSE 112 (H) 04/11/2020   BUN 9 04/11/2020   CREATININE 0.65 04/11/2020   CALCIUM 9.0 04/11/2020    Studies/Results: DG Lumbar Spine 2-3 Views  Result Date: 04/11/2020 CLINICAL DATA:  L1 fracture.  Back pain EXAM: LUMBAR SPINE - 2-3 VIEW COMPARISON:  04/04/2020 FINDINGS: Five lumbar type vertebral segments. Superior endplate compression fracture of the L1 vertebral body with approximately 30% vertebral body height loss. No interval progression in the degree of height loss from prior study. No additional fractures. Unchanged grade 1 anterolisthesis L4 on L5. Intervertebral disc spaces are preserved. Lower lumbar facet arthrosis. Diffuse osseous demineralization. Aortic atherosclerotic calcification. IMPRESSION: Superior endplate compression fracture of the L1 vertebral body with approximately 30% vertebral body height loss. No interval progression in height loss from prior study. Electronically Signed   By: Davina Poke D.O.   On: 04/11/2020 13:08    Assessment/Plan: 85  Year old female with L1  compression fracture. She is really wanting to be [place]. Therapy recommending SNF. Continue therapy today.   LOS: 7 days    Tracey Armstrong Tracey Armstrong 04/12/2020, 8:01 AM

## 2020-04-12 NOTE — Progress Notes (Signed)
Physical Therapy Treatment Patient Details Name: Tracey Armstrong MRN: [id number] DOB: 10/15/1935 Today's Date: 04/12/2020    History of Present Illness Pt is an 85 y/o female admitted secondary to worsening back pain. Pt found to have subacute L1 compression fx. Being managed with TLSO. PMH includes HTN and chronic back pain.     PT Comments    Pt received in bed, agreeable to participation in therapy. She required mod assist bed mobility, mod assist transfers, and mod assist +2 safety ambulation 15' with RW. Pt declining further gait distance due to pain. Pt in recliner at end of session, eating lunch.   Follow Up Recommendations  SNF;Supervision/Assistance - 24 hour     Equipment Recommendations  Other (comment) (TBD)    Recommendations for Other Services       Precautions / Restrictions Precautions Precautions: Back;Fall Precaution Comments: Verbally reviewed back precautions.  Required Braces or Orthoses: Spinal Brace Spinal Brace: Thoracolumbosacral orthotic;Applied in sitting position    Mobility  Bed Mobility Overal bed mobility: Needs Assistance Bed Mobility: Rolling;Sidelying to Sit Rolling: Mod assist Sidelying to sit: Mod assist       General bed mobility comments: cues for sequencing, +rail, assist with BLE and to elevate trunk. Pt unhappy with therapist, stating "You made me get up on the wrong side of the bed. I need to get out of the right side." This information was not relayed until pt was actively transitioning from L sidelying to sit.  Transfers Overall transfer level: Needs assistance Equipment used: Rolling walker (2 wheeled) Transfers: Sit to/from Stand Sit to Stand: Mod assist         General transfer comment: cues for hand placement, assist to power up. Sit to stand x 3 trials with RW (for hygiene/cleaning due to BM in bed).  Ambulation/Gait Ambulation/Gait assistance: +2 safety/equipment;Mod assist Gait Distance (Feet): 15 Feet Assistive  device: Rolling walker (2 wheeled) Gait Pattern/deviations: Step-through pattern;Decreased stride length Gait velocity: decreased Gait velocity interpretation: <1.8 ft/sec, indicate of risk for recurrent falls General Gait Details: Assist to maintain balance and manage RW. Slow, guarded gait. Pt declining further distance due to pain. +2 for chair follow.   Stairs             Wheelchair Mobility    Modified Rankin (Stroke Patients Only)       Balance Overall balance assessment: Needs assistance Sitting-balance support: Feet supported;Single extremity supported Sitting balance-Leahy Scale: Fair     Standing balance support: Bilateral upper extremity supported;During functional activity Standing balance-Leahy Scale: Poor Standing balance comment: reliant on external support                            Cognition Arousal/Alertness: Awake/alert Behavior During Therapy: Agitated;WFL for tasks assessed/performed Overall Cognitive Status: Within Functional Limits for tasks assessed                                 General Comments: Easily agitated. Raising her voice multi time during session, voicing discontent/unhappiness about various things: location of IV, fit of brace, pain, tender bottom due to multi BMs.      Exercises      General Comments        Pertinent Vitals/Pain Pain Assessment: Faces Faces Pain Scale: Hurts even more Pain Location: back Pain Descriptors / Indicators: Discomfort;Sore Pain Intervention(s): Repositioned;Limited activity within patient's tolerance;Monitored during session  Home Living                      Prior Function            PT Goals (current goals can now be found in the care plan section) Acute Rehab PT Goals Patient Stated Goal: to decrease pain  Progress towards PT goals: Progressing toward goals    Frequency    Min 3X/week      PT Plan Current plan remains appropriate     Co-evaluation              AM-PAC PT "6 Clicks" Mobility   Outcome Measure  Help needed turning from your back to your side while in a flat bed without using bedrails?: A Little Help needed moving from lying on your back to sitting on the side of a flat bed without using bedrails?: A Lot Help needed moving to and from a bed to a chair (including a wheelchair)?: A Little Help needed standing up from a chair using your arms (e.g., wheelchair or bedside chair)?: A Little Help needed to walk in hospital room?: A Lot Help needed climbing 3-5 steps with a railing? : Total 6 Click Score: 14    End of Session Equipment Utilized During Treatment: Gait belt;Back brace Activity Tolerance: Patient tolerated treatment well Patient left: in chair;with call bell/phone within reach Nurse Communication: Mobility status PT Visit Diagnosis: Unsteadiness on feet (R26.81);Muscle weakness (generalized) (M62.81);Difficulty in walking, not elsewhere classified (R26.2)     Time: 1601-0932 PT Time Calculation (min) (ACUTE ONLY): 16 min  Charges:  $Gait Training: 8-22 mins                     Lorrin Goodell, PT  Office # [phone] Pager [phone]    Tracey Armstrong 04/12/2020, 1:09 PM

## 2020-04-12 NOTE — Progress Notes (Signed)
PROGRESS NOTE    Tracey Armstrong  CHY:[id number] DOB: Mar 18, 1935 DOA: 04/04/2020 PCP: [place], [name], MD    Brief Narrative:  85 y.o. female with medical history significant for Hypertension, depression and chronic low back pain who presented to the emergency room with a 3-day history of worsening lower back pain and bilateral hip pain to the point where she was unable to get out of bed and ambulate at her baseline.  No recent fall or injury.  The pain was getting increasingly worse and was affecting her ability to manage independently as per her baseline. No history of bowel or bladder difficulties or numbness in the groin and inner thigh area.  On arrival to the emergency room vitals were within normal limits. She initially had an x-ray of the lumbar spine and pelvis showed an L1 compression fracture likely chronic.  Patient was treated with IV narcotics but continued to have intractable pain.  She subsequently had a CTA of the LS spine that showed possibly subacute L1 compression fracture with 30% height loss and 4 mm retropulsion.  No associated spinal stenosis.  So showed chronic mild to moderate spinal stenosis L3-4 and moderate to severe L4-5.  Neurosurgery was consulted from the emergency room.  They recommended TLSO brace and follow-up as outpatient.  Patient was admitted to hospital service for pain control.  TLSO brace was received.  Neurosurgery then saw patient several days later as she was continuing to have pain despite of wearing TLSO brace.  She was working with PT.  Who recommended  SNF discharge.  Assessment & Plan:   Principal Problem:   Intractable low back pain Active Problems:   Depression   Essential hypertension   Chronic low back pain   Closed wedge compression fracture of L1 vertebra (HCC)   Ambulatory dysfunction   L1 vertebral fracture (HCC)   Dehydration  Principal Problem:   Intractable low back pain   Subacute closed wedge compression fracture of L1 vertebra  (HCC) with 4 mm retropulsion without spinal stenosis   Ambulatory dysfunction   Chronic low back pain, moderate to severe spinal stenosis -Patient presents with a 2 to 3-day history of worsening low back pain impairing her ability to ambulate but without bowel or bladder problems or saddle anesthesia.   -CT on presentation showing subacute L1 vertebral fracture with 4 mm retropulsion without associated spinal stenosis.  30% height loss -Brace ordered from the emergency room after consulting with neurosurgery. -On multiple PO as well as IV narcotics overnight but patient's pain remains uncontrolled. -Currently on oxycodone q4hrs. Now on oxycontin 20mg  q12hrs. Continued on neurontin at night. Have since added home TID lyrica today -Pt understands goal is to not to achieve 0/10 pain, but instead to make pain more tolerable to allow physical therapy.  She was finally seen in person by neurosurgery on 04/11/2020.  Patient had repeat x-rays which does not show any change in her L1 compression fracture compared to few days ago.  Patient's pain is slightly better today.  She worked more with PT yesterday.  She does not look as uncomfortable today as she did yesterday.  Depression/anxiety -Continue alprazolam as tolerated    Essential hypertension -Recently hypotensive requiring IVF boluses -BP later improved after verapamil stopped -Will continue lower dose verapamil to allow pt to tolerate analgesia.  Dehydration -Improved with IVF hydration -cont to follow renal function  Bladder outlet obstruction - Over 700cc noted on bladder US with nearly 1L out via I/O cath just after  admit -Now voiding well since oxybutinin was held  Constipation Resolved.  She has had 2 bowel movements in last 24 hours.  DVT prophylaxis: Lovenox subq Code Status: DNR Family Communication: Pt in room, family not at bedside  Status is: Inpatient  The patient will require care spanning > 2 midnights and should be  moved to inpatient because: Ongoing active pain requiring inpatient pain management and Unsafe d/c plan  Dispo: The patient is from: Home              Anticipated d/c is to: SNF              Anticipated d/c date is: 1 to 2 days, TOC working with family to finalize a facility however patient adamant on returning to ALF while PT continues to recommend SNF.              Patient currently is medically stable to d/c.   Consultants:   Neurosurgery per EDP  Procedures:     Antimicrobials: Anti-infectives (From admission, onward)   None      Subjective: Seen and examined.  Patient states that her pain is slightly better and it is mostly controlled with pain medications.  No new complaint.  Objective: Vitals:   04/11/20 2349 04/12/20 0400 04/12/20 0832 04/12/20 1155  BP: 130/61 (!) 150/75 (!) 153/69 (!) 138/50  Pulse: 81 85 90 81  Resp: 18 16 16 16   Temp: 98.4 F (36.9 C) 98.3 F (36.8 C) 97.6 F (36.4 C) 98.3 F (36.8 C)  TempSrc: Oral Oral Oral Oral  SpO2: 96% 97% 95% 96%  Weight:      Height:        Intake/Output Summary (Last 24 hours) at 04/12/2020 1318 Last data filed at 04/12/2020 0900 Gross per 24 hour  Intake --  Output 1650 ml  Net -1650 ml   Filed Weights   04/04/20 2018  Weight: 71.8 kg    Examination: General exam: Appears calm and comfortable  Respiratory system: Clear to auscultation. Respiratory effort normal. Cardiovascular system: S1 & S2 heard, RRR. No JVD, murmurs, rubs, gallops or clicks. No pedal edema. Gastrointestinal system: Abdomen is nondistended, soft and nontender. No organomegaly or masses felt. Normal bowel sounds heard. Central nervous system: Alert and oriented. No focal neurological deficits. Extremities: 4/5 power in bilateral lower extremities.  Bilateral straight leg raise test positive. Skin: No rashes, lesions or ulcers.  Psychiatry: Judgement and insight appear normal. Mood & affect appropriate.   Data Reviewed: I have  personally reviewed following labs and imaging studies  CBC: Recent Labs  Lab 04/11/20 0538  WBC 5.0  HGB 12.0  HCT 35.4*  MCV 91.7  PLT 322   Basic Metabolic Panel: Recent Labs  Lab 04/07/20 0656 04/11/20 0538  NA 132* 135  K 4.0 4.1  CL 99 98  CO2 25 27  GLUCOSE 122* 112*  BUN 6* 9  CREATININE 0.67 0.65  CALCIUM 8.7* 9.0   GFR: Estimated Creatinine Clearance: 49.5 mL/min (by C-G formula based on SCr of 0.65 mg/dL). Liver Function Tests: Recent Labs  Lab 04/07/20 0656 04/11/20 0538  AST 20 34  ALT 17 45*  ALKPHOS 44 54  BILITOT 0.5 0.5  PROT 5.6* 5.4*  ALBUMIN 2.5* 2.3*   No results for input(s): LIPASE, AMYLASE in the last 168 hours. No results for input(s): AMMONIA in the last 168 hours. Coagulation Profile: No results for input(s): INR, PROTIME in the last 168 hours. Cardiac Enzymes: No results for  input(s): CKTOTAL, CKMB, CKMBINDEX, TROPONINI in the last 168 hours. BNP (last 3 results) No results for input(s): PROBNP in the last 8760 hours. HbA1C: No results for input(s): HGBA1C in the last 72 hours. CBG: No results for input(s): GLUCAP in the last 168 hours. Lipid Profile: No results for input(s): CHOL, HDL, LDLCALC, TRIG, CHOLHDL, LDLDIRECT in the last 72 hours. Thyroid Function Tests: No results for input(s): TSH, T4TOTAL, FREET4, T3FREE, THYROIDAB in the last 72 hours. Anemia Panel: No results for input(s): VITAMINB12, FOLATE, FERRITIN, TIBC, IRON, RETICCTPCT in the last 72 hours. Sepsis Labs: No results for input(s): PROCALCITON, LATICACIDVEN in the last 168 hours.  Recent Results (from the past 240 hour(s))  SARS Coronavirus 2 by RT PCR (hospital order, performed in [facility] hospital lab) Nasopharyngeal Nasopharyngeal Swab     Status: None   Collection Time: 04/04/20  8:00 PM   Specimen: Nasopharyngeal Swab  Result Value Ref Range Status   SARS Coronavirus 2 NEGATIVE NEGATIVE Final    Comment: (NOTE) SARS-CoV-2 target nucleic acids are  NOT DETECTED.  The SARS-CoV-2 RNA is generally detectable in upper and lower respiratory specimens during the acute phase of infection. The lowest concentration of SARS-CoV-2 viral copies this assay can detect is 250 copies / mL. A negative result does not preclude SARS-CoV-2 infection and should not be used as the sole basis for treatment or other patient management decisions.  A negative result may occur with improper specimen collection / handling, submission of specimen other than nasopharyngeal swab, presence of viral mutation(s) within the areas targeted by this assay, and inadequate number of viral copies (<250 copies / mL). A negative result must be combined with clinical observations, patient history, and epidemiological information.  Fact Sheet for Patients:   StrictlyIdeas.no  Fact Sheet for Healthcare Providers: BankingDealers.co.za  This test is not yet approved or  cleared by the [place] FDA and has been authorized for detection and/or diagnosis of SARS-CoV-2 by FDA under an Emergency Use Authorization (EUA).  This EUA will remain in effect (meaning this test can be used) for the duration of the COVID-19 declaration under Section 564(b)(1) of the Act, 21 U.S.C. section 360bbb-3(b)(1), unless the authorization is terminated or revoked sooner.  Performed at [facility], [place] [address], [place], [place] [address]      Radiology Studies: DG Lumbar Spine 2-3 Views  Result Date: 04/11/2020 CLINICAL DATA:  L1 fracture.  Back pain EXAM: LUMBAR SPINE - 2-3 VIEW COMPARISON:  04/04/2020 FINDINGS: Five lumbar type vertebral segments. Superior endplate compression fracture of the L1 vertebral body with approximately 30% vertebral body height loss. No interval progression in the degree of height loss from prior study. No additional fractures. Unchanged grade 1 anterolisthesis L4 on L5. Intervertebral disc spaces are  preserved. Lower lumbar facet arthrosis. Diffuse osseous demineralization. Aortic atherosclerotic calcification. IMPRESSION: Superior endplate compression fracture of the L1 vertebral body with approximately 30% vertebral body height loss. No interval progression in height loss from prior study. Electronically Signed   By: Davina Poke D.O.   On: 04/11/2020 13:08    Scheduled Meds: . docusate sodium  100 mg Oral BID  . gabapentin  300 mg Oral QHS  . magnesium oxide  200 mg Oral Daily  . oxyCODONE  20 mg Oral Q12H  . pantoprazole  20 mg Oral Daily  . pregabalin  50 mg Oral TID  . verapamil  40 mg Oral Q12H   Continuous Infusions:    LOS: 7 days  Darliss Cheney, MD [facility] Pager On Amion  If 7PM-7AM, please contact night-coverage 04/12/2020, 1:18 PM

## 2020-04-13 ENCOUNTER — Telehealth: Payer: Self-pay | Admitting: Family Medicine

## 2020-04-13 ENCOUNTER — Telehealth: Payer: Self-pay | Admitting: Physical Medicine and Rehabilitation

## 2020-04-13 LAB — CBC WITH DIFFERENTIAL/PLATELET
Abs Immature Granulocytes: 0.01 10*3/uL (ref 0.00–0.07)
Basophils Absolute: 0 10*3/uL (ref 0.0–0.1)
Basophils Relative: 1 %
Eosinophils Absolute: 0.3 10*3/uL (ref 0.0–0.5)
Eosinophils Relative: 6 %
HCT: 36.7 % (ref 36.0–46.0)
Hemoglobin: 12.4 g/dL (ref 12.0–15.0)
Immature Granulocytes: 0 %
Lymphocytes Relative: 42 %
Lymphs Abs: 1.9 10*3/uL (ref 0.7–4.0)
MCH: 31.5 pg (ref 26.0–34.0)
MCHC: 33.8 g/dL (ref 30.0–36.0)
MCV: 93.1 fL (ref 80.0–100.0)
Monocytes Absolute: 0.4 10*3/uL (ref 0.1–1.0)
Monocytes Relative: 10 %
Neutro Abs: 1.8 10*3/uL (ref 1.7–7.7)
Neutrophils Relative %: 41 %
Platelets: 370 10*3/uL (ref 150–400)
RBC: 3.94 MIL/uL (ref 3.87–5.11)
RDW: 11.9 % (ref 11.5–15.5)
WBC: 4.4 10*3/uL (ref 4.0–10.5)
nRBC: 0 % (ref 0.0–0.2)

## 2020-04-13 LAB — BASIC METABOLIC PANEL
Anion gap: 10 (ref 5–15)
BUN: 7 mg/dL — ABNORMAL LOW (ref 8–23)
CO2: 26 mmol/L (ref 22–32)
Calcium: 8.9 mg/dL (ref 8.9–10.3)
Chloride: 100 mmol/L (ref 98–111)
Creatinine, Ser: 0.69 mg/dL (ref 0.44–1.00)
GFR calc Af Amer: >60 mL/min (ref >60)
GFR calc non Af Amer: >60 mL/min (ref >60)
Glucose, Bld: 106 mg/dL — ABNORMAL HIGH (ref 70–99)
Potassium: 4 mmol/L (ref 3.5–5.1)
Sodium: 136 mmol/L (ref 135–145)

## 2020-04-13 MED ORDER — TIZANIDINE HCL 4 MG PO TABS: 4.0000 mg | ORAL_TABLET | Freq: Two times a day (BID) | ORAL | 0 refills | Status: AC | PRN

## 2020-04-13 MED ORDER — ALPRAZOLAM 1 MG PO TABS: 0.5000 mg | ORAL_TABLET | Freq: Two times a day (BID) | ORAL | 0 refills | Status: DC | PRN

## 2020-04-13 MED ORDER — OXYCODONE HCL 5 MG PO TABS: 5.0000 mg | ORAL_TABLET | ORAL | 0 refills | Status: DC | PRN

## 2020-04-13 NOTE — Telephone Encounter (Signed)
Patient called advised she need to cancel her appointment due to being in [facility]. Patient said she will be moving to a rehab center in the next few days. The number to contact patient is [phone]

## 2020-04-13 NOTE — TOC Progression Note (Signed)
Transition of Care [facility]) - Progression Note    Patient Details  Name: Tracey Armstrong MRN: [id number] Date of Birth: [birth year]  Transition of Care [facility] CM/SW Tracey Armstrong, Tracey Armstrong Phone Number: 04/13/2020, 11:18 AM  Clinical Narrative:     CSW visit with patient at bedside. CSW informed patient her son,Tracey Armstrong requested [facility] or [facility], patient states her preference is [facility]. CSW contacted [facility]- they will confirm if they have availability today.   Patient's son informed of patient's preferred SNF choice.  Thurmond Butts, MSW, [place] Clinical Social Worker        Expected Discharge Plan and Services   In-house Referral: Clinical Social Work     Living arrangements for the past 2 months: [place]                                       Social Determinants of Health (SDOH) Interventions    Readmission Risk Interventions No flowsheet data found.

## 2020-04-13 NOTE — TOC Transition Note (Signed)
Transition of Care [facility]) - CM/SW Discharge Note   Patient Details  Name: Zarielle Cea MRN: [id number] Date of Birth: 1935-06-22  Transition of Care [facility]) CM/SW Contact:  Vinie Sill, [place] Phone Number: 04/13/2020, 12:59 PM   Clinical Narrative:     Patient will DC to: [facility] Date: 04/13/2020 Family Notified: Qunit,son Transport OI:PPGF   Per MD patient is ready for discharge. RN, patient, and facility notified of DC. Discharge Summary sent to facility. RN given number for [phone]. Ambulance transport requested for patient.   Clinical Social Worker signing off.  Thurmond Butts, MSW, LCSWA Clinical Social Worker    Final next level of care: Skilled Nursing Facility Barriers to Discharge: Barriers Resolved   Patient Goals and CMS Choice        Discharge Placement PASRR number recieved: 04/12/20            Patient chooses bed at: [facility] Patient to be transferred to facility by: [place] Name of family member notified: [name], son Patient and family notified of of transfer: 04/13/20  Discharge Plan and Services In-house Referral: Clinical Social Work                                   Social Determinants of Health (SDOH) Interventions     Readmission Risk Interventions No flowsheet data found.

## 2020-04-13 NOTE — Telephone Encounter (Signed)
Appointment cancelled

## 2020-04-13 NOTE — Discharge Summary (Signed)
Physician Discharge Summary  Tracey Armstrong JKK:[id number] DOB: 06/21/1935 DOA: 04/04/2020  PCP: Martinique, [name], MD  Admit date: 04/04/2020 Discharge date: 04/13/2020  Admitted From: Home Disposition: SNF  Recommendations for Outpatient Follow-up:  1. Follow up with PCP in 1-2 weeks 2. Follow-up with neurosurgery/Dr. Ronnald Ramp in 2 to 3 weeks 3. Please obtain BMP/CBC in one week 4. Please follow up with your PCP on the following pending results: Unresulted Labs (From admission, onward) Comment         None       Home Health: None Equipment/Devices: None  Discharge Condition: Stable CODE STATUS: DNR Diet recommendation: Cardiac  Subjective: Seen and examined.  Continues to have back pain however that is improving.  No other complaint.  Son at the bedside.  Willing to go to SNF today.  Son in agreement.  Brief/Interim Summary: 85 y.o.femalewith medical history significant forHypertension, depression and chronic low back pain who presented to the emergency room with a 3-day history ofworsening lower back pain and bilateral hip pain to the point where she was unable to get out of bed and ambulate at her baseline.  No recent fall or injury. The pain was getting increasingly worse and was affecting her ability to manage independently as per her baseline. No history of bowel or bladder difficulties or numbness in the groin and inner thigh area.  On arrival to the emergency room vitals were within normal limits. She initially had an x-ray of the lumbar spine and pelvis showed an L1 compression fracture likely chronic. Patient was treated with IV narcotics but continued to have intractable pain. She subsequently had a CTA of the LS spine that showed possibly subacute L1 compression fracture with 30% height loss and 4 mm retropulsion. No associated spinal stenosis. So showed chronic mild to moderate spinal stenosis L3-4 and moderate to severe L4-5. Neurosurgery was consulted from the  emergency room.  They recommended TLSO brace and follow-up as outpatient.  Patient was admitted to hospital service for pain control.  TLSO brace was obtained.  Neurosurgery then saw patient several days later as she was continuing to have pain despite of wearing TLSO brace.  She was working with PT.  Who recommendedSNF discharge.  Neurosurgery repeated x-ray of the lumbar spine which did not show any change in L1 compression fracture.  In the meantime, patient's pain improved with current pain medications and she started to work better with physical therapy.  Neurosurgery continue to recommend TLSO brace with no surgery and follow-up with them as outpatient in 2 to 3 weeks.  Initially, patient was supposed to go to SNF as she wanted to go back to ALF but after persistent pursuit, she agreed and her son also agreed and thus she is going to be discharged to SNF in stable condition today.  Discharge Diagnoses:  Principal Problem:   Intractable low back pain Active Problems:   Depression   Essential hypertension   Chronic low back pain   Closed wedge compression fracture of L1 vertebra (HCC)   Ambulatory dysfunction   L1 vertebral fracture (HCC)   Dehydration    Discharge Instructions   Allergies as of 04/13/2020      Reactions   Dihydrotachysterol    Other reaction(s): Other (See Comments)   Trazodone And Nefazodone Other (See Comments)   Worsens headaches   Valium [diazepam] Other (See Comments)   headaches   Citalopram Other (See Comments)   Other reaction(s): Headache   Other Other (See Comments)   "  States all antidepressants cause migraines."      Medication List    STOP taking these medications   methylPREDNISolone 4 MG tablet Commonly known as: Medrol     TAKE these medications   ALPRAZolam 1 MG tablet Commonly known as: XANAX Take 0.5 tablets (0.5 mg total) by mouth 2 (two) times daily as needed for up to 10 doses for anxiety. What changed: See the new instructions.    gabapentin 300 MG capsule Commonly known as: NEURONTIN TAKE 1 CAPSULE BY MOUTH AT BEDTIME   lansoprazole 15 MG capsule Commonly known as: PREVACID 1 tab at 7:00 am (30 min before breakfast). What changed:   how much to take  how to take this  when to take this  additional instructions   Magnesium 250 MG Tabs Take 250 mg by mouth daily. 2 PO DAILY   Maxalt-MLT 10 MG disintegrating tablet Generic drug: rizatriptan DISSOLVE ONE TABLET BY MOUTH AS NEEDED HEADACHE What changed: See the new instructions.   oxybutynin 10 MG 24 hr tablet Commonly known as: Ditropan XL Take 1 tablet (10 mg total) by mouth at bedtime.   oxyCODONE 5 MG immediate release tablet Commonly known as: Oxy IR/ROXICODONE Take 1 tablet (5 mg total) by mouth every 4 (four) hours as needed for severe pain.   pregabalin 50 MG capsule Commonly known as: LYRICA TAKE 1 CAPSULE(50 MG) BY MOUTH THREE TIMES DAILY What changed: See the new instructions.   Riboflavin 400 MG Caps Take 1 capsule (400 mg total) by mouth daily.   tiZANidine 4 MG tablet Commonly known as: ZANAFLEX Take 1 tablet (4 mg total) by mouth 2 (two) times daily as needed for up to 5 days for muscle spasms. What changed: See the new instructions.   tretinoin 0.1 % cream Commonly known as: RETIN-A APPLY TO AFFECTED AREA EVERY DAY AT BEDTIME What changed: See the new instructions.   Turmeric 500 MG Caps Take 1 capsule by mouth daily.   verapamil 180 MG 24 hr capsule Commonly known as: VERELAN PM TAKE 1 CAPSULE(180 MG) BY MOUTH DAILY What changed:   how much to take  how to take this  when to take this  additional instructions   Vitamin D (Ergocalciferol) 1.25 MG (50000 UNIT) Caps capsule Commonly known as: DRISDOL 1 cap every 2 weeks. What changed:   how much to take  how to take this  when to take this  additional instructions       Follow-up Information    Martinique, [name], MD Follow up in 1 week(s).    Specialty: Family Medicine Contact information: [place] [address] [phone]        Eustace Moore, MD Follow up in 2 week(s).   Specialty: Neurosurgery Contact information: [address] [place] [address] [phone]              Allergies  Allergen Reactions  . Dihydrotachysterol     Other reaction(s): Other (See Comments)  . Trazodone And Nefazodone Other (See Comments)    Worsens headaches  . Valium [Diazepam] Other (See Comments)    headaches  . Citalopram Other (See Comments)    Other reaction(s): Headache  . Other Other (See Comments)    "States all antidepressants cause migraines."    Consultations: Neurosurgery   Procedures/Studies: DG Lumbar Spine 2-3 Views  Result Date: 04/11/2020 CLINICAL DATA:  L1 fracture.  Back pain EXAM: LUMBAR SPINE - 2-3 VIEW COMPARISON:  04/04/2020  FINDINGS: Five lumbar type vertebral segments. Superior endplate compression fracture of the L1 vertebral body with approximately 30% vertebral body height loss. No interval progression in the degree of height loss from prior study. No additional fractures. Unchanged grade 1 anterolisthesis L4 on L5. Intervertebral disc spaces are preserved. Lower lumbar facet arthrosis. Diffuse osseous demineralization. Aortic atherosclerotic calcification. IMPRESSION: Superior endplate compression fracture of the L1 vertebral body with approximately 30% vertebral body height loss. No interval progression in height loss from prior study. Electronically Signed   By: Davina Poke D.O.   On: 04/11/2020 13:08   DG Lumbar Spine Complete  Result Date: 04/04/2020 CLINICAL DATA:  Low back pain while bending, initial encounter EXAM: LUMBAR SPINE - COMPLETE 4+ VIEW COMPARISON:  07/28/2018 FINDINGS: Five lumbar type vertebral bodies are well visualized. L1 compression deformity is noted which is new from the prior exam but appears chronic in nature. Correlation  to point tenderness is recommended. Mild degenerative anterolisthesis of L4 on L5 is noted stable from prior exam. Mild facet hypertrophic changes are noted. Ingested tablets are noted in the right abdomen. No soft tissue abnormality is seen. IMPRESSION: L1 compression deformity which appears chronic but new from a prior exam of 2019. Correlation to point tenderness is recommended. Degenerative change with anterolisthesis of L4 on L5. Electronically Signed   By: Inez Catalina M.D.   On: 04/04/2020 10:23   CT Lumbar Spine Wo Contrast  Result Date: 04/04/2020 CLINICAL DATA:  Low back pain for 3 days. EXAM: CT LUMBAR SPINE WITHOUT CONTRAST TECHNIQUE: Multidetector CT imaging of the lumbar spine was performed without intravenous contrast administration. Multiplanar CT image reconstructions were also generated. COMPARISON:  Lumbar spine radiographs 04/04/2020 and MRI 09/09/2018 FINDINGS: Segmentation: 5 lumbar type vertebrae. Alignment: Mild thoracolumbar levoscoliosis. Grade 1 anterolisthesis of L4 on L5, unchanged from the prior MRI. Vertebrae: L1 superior endplate compression fracture with 30% vertebral body height loss and 4 mm retropulsion of the superior endplate. A fracture line remains visible although there is surrounding sclerosis. Hemangioma in the L4 vertebral body. Paraspinal and other soft tissues: Aortic atherosclerosis without aneurysm. Disc levels: Disc bulging and posterior element hypertrophy result in chronic mild-to-moderate spinal stenosis at L3-4 and moderate to severe spinal stenosis at L4-5. No significant spinal stenosis related to mild L1 retropulsion. IMPRESSION: 1. Possibly subacute L1 compression fracture with 30% height loss and 4 mm retropulsion. No associated spinal stenosis. 2. Chronic mild-to-moderate spinal stenosis at L3-4 and moderate to severe spinal stenosis at L4-5. 3. Aortic Atherosclerosis (ICD10-I70.0). Electronically Signed   By: Logan Bores M.D.   On: 04/04/2020 17:39    DG HIP UNILAT WITH PELVIS 2-3 VIEWS LEFT  Result Date: 04/04/2020 CLINICAL DATA:  Left hip pain following bending, initial encounter EXAM: DG HIP (WITH OR WITHOUT PELVIS) 2V LEFT COMPARISON:  None. FINDINGS: No acute fracture or dislocation is seen. No significant degenerative changes are noted. No soft tissue abnormality is seen. IMPRESSION: No acute abnormality noted. Electronically Signed   By: Inez Catalina M.D.   On: 04/04/2020 10:24   DG HIP UNILAT WITH PELVIS 2-3 VIEWS RIGHT  Result Date: 04/04/2020 CLINICAL DATA:  Right hip pain following bending, initial encounter EXAM: DG HIP (WITH OR WITHOUT PELVIS) 3V RIGHT COMPARISON:  None. FINDINGS: Pelvic ring is intact. No acute fracture or dislocation is noted. No significant degenerative changes are seen. No soft tissue abnormality is noted. IMPRESSION: No acute abnormality noted. Electronically Signed   By: Inez Catalina M.D.   On: 04/04/2020  10:26      Discharge Exam: Vitals:   04/13/20 0835 04/13/20 1139  BP: (!) 120/49 (!) 131/55  Pulse: 87 77  Resp: 16 17  Temp: 97.9 F (36.6 C) 98.1 F (36.7 C)  SpO2: 95% 100%   Vitals:   04/12/20 2322 04/13/20 0342 04/13/20 0835 04/13/20 1139  BP: (!) 109/54 (!) 151/76 (!) 120/49 (!) 131/55  Pulse: 79 82 87 77  Resp: 19 18 16 17   Temp: 98.2 F (36.8 C) 97.8 F (36.6 C) 97.9 F (36.6 C) 98.1 F (36.7 C)  TempSrc: Oral Oral  Oral  SpO2: 95% 99% 95% 100%  Weight:      Height:        General: Pt is alert, awake, not in acute distress Cardiovascular: RRR, S1/S2 +, no rubs, no gallops Respiratory: CTA bilaterally, no wheezing, no rhonchi Abdominal: Soft, NT, ND, bowel sounds + Extremities: no edema, no cyanosis    The results of significant diagnostics from this hospitalization (including imaging, microbiology, ancillary and laboratory) are listed below for reference.     Microbiology: Recent Results (from the past 240 hour(s))  SARS Coronavirus 2 by RT PCR (hospital order,  performed in [facility] hospital lab) Nasopharyngeal Nasopharyngeal Swab     Status: None   Collection Time: 04/04/20  8:00 PM   Specimen: Nasopharyngeal Swab  Result Value Ref Range Status   SARS Coronavirus 2 NEGATIVE NEGATIVE Final    Comment: (NOTE) SARS-CoV-2 target nucleic acids are NOT DETECTED.  The SARS-CoV-2 RNA is generally detectable in upper and lower respiratory specimens during the acute phase of infection. The lowest concentration of SARS-CoV-2 viral copies this assay can detect is 250 copies / mL. A negative result does not preclude SARS-CoV-2 infection and should not be used as the sole basis for treatment or other patient management decisions.  A negative result may occur with improper specimen collection / handling, submission of specimen other than nasopharyngeal swab, presence of viral mutation(s) within the areas targeted by this assay, and inadequate number of viral copies (<250 copies / mL). A negative result must be combined with clinical observations, patient history, and epidemiological information.  Fact Sheet for Patients:   StrictlyIdeas.no  Fact Sheet for Healthcare Providers: BankingDealers.co.za  This test is not yet approved or  cleared by the [place] FDA and has been authorized for detection and/or diagnosis of SARS-CoV-2 by FDA under an Emergency Use Authorization (EUA).  This EUA will remain in effect (meaning this test can be used) for the duration of the COVID-19 declaration under Section 564(b)(1) of the Act, 21 U.S.C. section 360bbb-3(b)(1), unless the authorization is terminated or revoked sooner.  Performed at [facility], [place] [address] [place], [place] [address]   SARS Coronavirus 2 by RT PCR (hospital order, performed in [facility] hospital lab) Nasopharyngeal Nasopharyngeal Swab     Status: None   Collection Time: 04/12/20  9:52 PM   Specimen: Nasopharyngeal Swab   Result Value Ref Range Status   SARS Coronavirus 2 NEGATIVE NEGATIVE Final    Comment: (NOTE) SARS-CoV-2 target nucleic acids are NOT DETECTED.  The SARS-CoV-2 RNA is generally detectable in upper and lower respiratory specimens during the acute phase of infection. The lowest concentration of SARS-CoV-2 viral copies this assay can detect is 250 copies / mL. A negative result does not preclude SARS-CoV-2 infection and should not be used as the sole basis for treatment or other patient management decisions.  A negative result may occur  with improper specimen collection / handling, submission of specimen other than nasopharyngeal swab, presence of viral mutation(s) within the areas targeted by this assay, and inadequate number of viral copies (<250 copies / mL). A negative result must be combined with clinical observations, patient history, and epidemiological information.  Fact Sheet for Patients:   StrictlyIdeas.no  Fact Sheet for Healthcare Providers: BankingDealers.co.za  This test is not yet approved or  cleared by the [place] FDA and has been authorized for detection and/or diagnosis of SARS-CoV-2 by FDA under an Emergency Use Authorization (EUA).  This EUA will remain in effect (meaning this test can be used) for the duration of the COVID-19 declaration under Section 564(b)(1) of the Act, 21 U.S.C. section 360bbb-3(b)(1), unless the authorization is terminated or revoked sooner.  Performed at [facility], [place] [address], [place], [place] [address]      Labs: BNP (last 3 results) No results for input(s): BNP in the last 8760 hours. Basic Metabolic Panel: Recent Labs  Lab 04/07/20 0656 04/11/20 0538 04/13/20 0505  NA 132* 135 136  K 4.0 4.1 4.0  CL 99 98 100  CO2 25 27 26   GLUCOSE 122* 112* 106*  BUN 6* 9 7*  CREATININE 0.67 0.65 0.69  CALCIUM 8.7* 9.0 8.9   Liver Function Tests: Recent Labs  Lab  04/07/20 0656 04/11/20 0538  AST 20 34  ALT 17 45*  ALKPHOS 44 54  BILITOT 0.5 0.5  PROT 5.6* 5.4*  ALBUMIN 2.5* 2.3*   No results for input(s): LIPASE, AMYLASE in the last 168 hours. No results for input(s): AMMONIA in the last 168 hours. CBC: Recent Labs  Lab 04/11/20 0538 04/13/20 0505  WBC 5.0 4.4  NEUTROABS  --  1.8  HGB 12.0 12.4  HCT 35.4* 36.7  MCV 91.7 93.1  PLT 323 370   Cardiac Enzymes: No results for input(s): CKTOTAL, CKMB, CKMBINDEX, TROPONINI in the last 168 hours. BNP: Invalid input(s): POCBNP CBG: No results for input(s): GLUCAP in the last 168 hours. D-Dimer No results for input(s): DDIMER in the last 72 hours. Hgb A1c No results for input(s): HGBA1C in the last 72 hours. Lipid Profile No results for input(s): CHOL, HDL, LDLCALC, TRIG, CHOLHDL, LDLDIRECT in the last 72 hours. Thyroid function studies No results for input(s): TSH, T4TOTAL, T3FREE, THYROIDAB in the last 72 hours.  Invalid input(s): FREET3 Anemia work up No results for input(s): VITAMINB12, FOLATE, FERRITIN, TIBC, IRON, RETICCTPCT in the last 72 hours. Urinalysis    Component Value Date/Time   BILIRUBINUR neg 06/10/2017 1728   PROTEINUR neg 06/10/2017 1728   UROBILINOGEN 1.0 06/10/2017 1728   NITRITE neg 06/10/2017 1728   LEUKOCYTESUR Negative 06/10/2017 1728   Sepsis Labs Invalid input(s): PROCALCITONIN,  WBC,  LACTICIDVEN Microbiology Recent Results (from the past 240 hour(s))  SARS Coronavirus 2 by RT PCR (hospital order, performed in [facility] hospital lab) Nasopharyngeal Nasopharyngeal Swab     Status: None   Collection Time: 04/04/20  8:00 PM   Specimen: Nasopharyngeal Swab  Result Value Ref Range Status   SARS Coronavirus 2 NEGATIVE NEGATIVE Final    Comment: (NOTE) SARS-CoV-2 target nucleic acids are NOT DETECTED.  The SARS-CoV-2 RNA is generally detectable in upper and lower respiratory specimens during the acute phase of infection. The lowest concentration of  SARS-CoV-2 viral copies this assay can detect is 250 copies / mL. A negative result does not preclude SARS-CoV-2 infection and should not be used as the sole basis for  treatment or other patient management decisions.  A negative result may occur with improper specimen collection / handling, submission of specimen other than nasopharyngeal swab, presence of viral mutation(s) within the areas targeted by this assay, and inadequate number of viral copies (<250 copies / mL). A negative result must be combined with clinical observations, patient history, and epidemiological information.  Fact Sheet for Patients:   StrictlyIdeas.no  Fact Sheet for Healthcare Providers: BankingDealers.co.za  This test is not yet approved or  cleared by the [place] FDA and has been authorized for detection and/or diagnosis of SARS-CoV-2 by FDA under an Emergency Use Authorization (EUA).  This EUA will remain in effect (meaning this test can be used) for the duration of the COVID-19 declaration under Section 564(b)(1) of the Act, 21 U.S.C. section 360bbb-3(b)(1), unless the authorization is terminated or revoked sooner.  Performed at [facility], [place] [address], [place], [place] [address]   SARS Coronavirus 2 by RT PCR (hospital order, performed in [facility] hospital lab) Nasopharyngeal Nasopharyngeal Swab     Status: None   Collection Time: 04/12/20  9:52 PM   Specimen: Nasopharyngeal Swab  Result Value Ref Range Status   SARS Coronavirus 2 NEGATIVE NEGATIVE Final    Comment: (NOTE) SARS-CoV-2 target nucleic acids are NOT DETECTED.  The SARS-CoV-2 RNA is generally detectable in upper and lower respiratory specimens during the acute phase of infection. The lowest concentration of SARS-CoV-2 viral copies this assay can detect is 250 copies / mL. A negative result does not preclude SARS-CoV-2 infection and should not be used as the sole basis  for treatment or other patient management decisions.  A negative result may occur with improper specimen collection / handling, submission of specimen other than nasopharyngeal swab, presence of viral mutation(s) within the areas targeted by this assay, and inadequate number of viral copies (<250 copies / mL). A negative result must be combined with clinical observations, patient history, and epidemiological information.  Fact Sheet for Patients:   StrictlyIdeas.no  Fact Sheet for Healthcare Providers: BankingDealers.co.za  This test is not yet approved or  cleared by the [place] FDA and has been authorized for detection and/or diagnosis of SARS-CoV-2 by FDA under an Emergency Use Authorization (EUA).  This EUA will remain in effect (meaning this test can be used) for the duration of the COVID-19 declaration under Section 564(b)(1) of the Act, 21 U.S.C. section 360bbb-3(b)(1), unless the authorization is terminated or revoked sooner.  Performed at [facility], [place] [address], [place], [place] [address]      Time coordinating discharge: Over 30 minutes  SIGNED:   Darliss Cheney, MD  [facility] 04/13/2020, 12:37 PM  If 7PM-7AM, please contact night-coverage [email or web address]

## 2020-04-13 NOTE — Progress Notes (Signed)
Pt discharged to [facility]. Report was called and given to SNF RN. Pt's PIV removed, room belongings packed, and pain medication given prior to transportation. Pt escorted out on stretcher by [facility].  Justice Rocher, RN

## 2020-04-13 NOTE — Telephone Encounter (Signed)
FYI

## 2020-04-13 NOTE — Discharge Instructions (Signed)
Lumbar Spine Fracture A lumbar spine fracture is a break in one of the bones of the lower back. Lumbar spine fractures can vary from mild to severe. The most severe types are those that:  Cause the broken bones to move out of place (unstable).  Injure or press on the spinal cord. During recovery, it is normal to have pain and stiffness in the lower back for weeks. What are the causes? This condition may be caused by:  A fall.  A car accident.  A gunshot wound.  A hard, direct hit to the back. What increases the risk? You are more likely to develop this condition if:  You are in a situation that could result in a fall or other violent injury.  You have a condition that causes weakness in the bones (osteoporosis). What are the signs or symptoms? The main symptom of this condition is severe pain in the lower back. If a fracture is complex or severe, there may also be:  A misshapen or swollen area on the lower back.  Limited ability to move an area of the lower back.  Inability to empty the bladder (urinary retention).  Loss of bowel or bladder control (incontinence).  Loss of strength or sensation in the legs, feet, and toes.  Inability to move (paralysis). How is this diagnosed? This condition is diagnosed based on:  A physical exam.  Symptoms and what happened just before they developed.  The results of imaging tests, such as an X-ray, CT scan, or MRI. If your nerves have been damaged, you may also have other tests to find out the extent of the damage. How is this treated? Treatment for this condition depends on how severe the injury is. Most fractures can be treated with:  A back brace.  Bed rest and activity restrictions.  Pain medicine.  Physical therapy. Fractures that are complex, involve multiple bones, or make the spine unstable may require surgery. Surgery is done:  To remove pressure from the nerves or spinal cord.  To stabilize the broken pieces of  bone. Follow these instructions at home: Medicines  Take over-the-counter and prescription medicines only as told by your health care provider.  Do not drive or use heavy machinery while taking prescription pain medicine.  If you are taking prescription pain medicine, take actions to prevent or treat constipation. Your health care provider may recommend that you: ? Drink enough fluid to keep your urine pale yellow. ? Eat foods that are high in fiber, such as fresh fruits and vegetables, whole grains, and beans. ? Limit foods that are high in fat and processed sugars, such as fried or sweet foods. ? Take an over-the-counter or prescription medicine for constipation. If you have a brace:  Wear the back brace as told by your health care provider. Remove it only as told by your health care provider.  Keep the brace clean.  If the brace is not waterproof: ? Do not let it get wet. ? Cover it with a watertight covering when you take a bath or a shower. Activity  Stay in bed (on bed rest) only as directed by your health care provider.  Do exercises to improve motion and strength in your back (physical therapy), if your health care provider tells you to do so.  Return to your normal activities as directed by your health care provider. Ask your health care provider what activities are safe for you. Managing pain, stiffness, and swelling   If directed, put ice  on the injured area: ? If you have a removable brace, remove it as told by your health care provider. ? Put ice in a plastic bag. ? Place a towel between your skin and the bag. ? Leave the ice on for 20 minutes, 2-3 times a day. General instructions  Do not use any products that contain nicotine or tobacco, such as cigarettes and e-cigarettes. These can delay healing after injury. If you need help quitting, ask your health care provider.  Do not drink alcohol. Alcohol can interfere with your treatment.  Keep all follow-up visits  as directed by your health care provider. This is important. ? Failing to follow up as recommended could result in permanent injury, disability, or long-lasting (chronic) pain. Contact a health care provider if:  You have a fever.  Your pain medicine is not helping.  Your pain does not get better over time.  You cannot return to your normal activities as planned or expected. Get help right away if:  You have difficulty breathing.  Your pain is very bad and it suddenly gets worse.  You have numbness, tingling, or weakness in any part of your body.  You are unable to empty your bladder.  You cannot control your bladder or bowels.  You are unable to move any body part (paralysis) that is below the level of your injury.  You vomit.  You have pain in your abdomen. Summary  A lumbar spine fracture is a break in one of the bones of the lower back.  The main symptom of this condition is severe pain in the lower back. If a fracture is complex, there may also be numbness, tingling, or paralysis in the legs.  Treatment depends on how severe the injury is. Most fractures can be treated with a back brace, bed rest and activity restrictions, pain medicine, and physical therapy.  Fractures that are complex, involve multiple bones, or make the spine unstable may require surgery. This information is not intended to replace advice given to you by your health care provider. Make sure you discuss any questions you have with your health care provider. Document Revised: 11/21/2017 Document Reviewed: 11/21/2017 Elsevier Patient Education  2020 Elsevier Inc.  

## 2020-04-13 NOTE — Telephone Encounter (Signed)
Pt calling in stating that she is in the hospital with a fracture in her vertebrate and will be probably sent to be sent to a rehab after getting out.  Pt state that she is so sorry about having to cancel her appointment.  Pt is aware that is okay things happen and if she should need [place] to please give [place] a call.

## 2020-04-13 NOTE — TOC Progression Note (Addendum)
Transition of Care [facility]) - Progression Note    Patient Details  Name: Tracey Armstrong MRN: [id number] Date of Birth: 12/30/1934  Transition of Care [facility]) CM/SW [place], [place] Phone Number: 04/13/2020, 12:46 PM  Clinical Narrative:      [facility] confirmed admission today and patient accepted offer. CSW called,LVM with patient's son, [name].  Thurmond Butts, MSW, [place] Clinical Social Worker       Expected Discharge Plan and Services   In-house Referral: Clinical Social Work     Living arrangements for the past 2 months: [place] Expected Discharge Date: 04/13/20                                     Social Determinants of Health (SDOH) Interventions    Readmission Risk Interventions No flowsheet data found.

## 2020-04-18 ENCOUNTER — Encounter: Payer: Medicare Other | Admitting: Physical Medicine and Rehabilitation

## 2020-04-18 ENCOUNTER — Ambulatory Visit: Payer: Medicare Other | Admitting: Family Medicine

## 2020-04-19 DIAGNOSIS — M6281 Muscle weakness (generalized): Secondary | ICD-10-CM | POA: Diagnosis not present

## 2020-04-19 DIAGNOSIS — G8929 Other chronic pain: Secondary | ICD-10-CM | POA: Diagnosis not present

## 2020-04-19 DIAGNOSIS — M48061 Spinal stenosis, lumbar region without neurogenic claudication: Secondary | ICD-10-CM | POA: Diagnosis not present

## 2020-04-19 DIAGNOSIS — F329 Major depressive disorder, single episode, unspecified: Secondary | ICD-10-CM | POA: Diagnosis not present

## 2020-04-19 DIAGNOSIS — S32019D Unspecified fracture of first lumbar vertebra, subsequent encounter for fracture with routine healing: Secondary | ICD-10-CM | POA: Diagnosis not present

## 2020-04-19 DIAGNOSIS — N3289 Other specified disorders of bladder: Secondary | ICD-10-CM | POA: Diagnosis not present

## 2020-04-19 DIAGNOSIS — K219 Gastro-esophageal reflux disease without esophagitis: Secondary | ICD-10-CM | POA: Diagnosis not present

## 2020-04-19 DIAGNOSIS — G2581 Restless legs syndrome: Secondary | ICD-10-CM | POA: Diagnosis not present

## 2020-04-19 DIAGNOSIS — F419 Anxiety disorder, unspecified: Secondary | ICD-10-CM | POA: Diagnosis not present

## 2020-04-19 DIAGNOSIS — R2681 Unsteadiness on feet: Secondary | ICD-10-CM | POA: Diagnosis not present

## 2020-04-19 DIAGNOSIS — E860 Dehydration: Secondary | ICD-10-CM | POA: Diagnosis not present

## 2020-04-19 DIAGNOSIS — R278 Other lack of coordination: Secondary | ICD-10-CM | POA: Diagnosis not present

## 2020-04-19 DIAGNOSIS — M545 Low back pain: Secondary | ICD-10-CM | POA: Diagnosis not present

## 2020-04-19 DIAGNOSIS — G43909 Migraine, unspecified, not intractable, without status migrainosus: Secondary | ICD-10-CM | POA: Diagnosis not present

## 2020-04-19 DIAGNOSIS — I10 Essential (primary) hypertension: Secondary | ICD-10-CM | POA: Diagnosis not present

## 2020-04-19 DIAGNOSIS — S32010D Wedge compression fracture of first lumbar vertebra, subsequent encounter for fracture with routine healing: Secondary | ICD-10-CM | POA: Diagnosis not present

## 2020-04-27 ENCOUNTER — Other Ambulatory Visit: Payer: Self-pay | Admitting: *Deleted

## 2020-04-27 NOTE — Patient Outreach (Signed)
Late entry for 04/26/20  Screened for potential [facility] Care Management needs as a benefit of  [employer].  Mrs. Clinger is receiving skilled therapy at [facility]    Writer attended telephonic interdisciplinary team meeting to assess for disposition needs and transition plan for resident.   Facility reports member will return to [facility] [place] with outpatient therapy at [facility]. Has supportive sons. Patient driven discharge for 05/01/20.  No identifiable [facility] Care Management needs at this time.   Marthenia Rolling, MSN-Ed, RN,BSN [place] Acute Care Coordinator [phone] [facility]) [phone]  (Toll free office)

## 2020-05-02 DIAGNOSIS — M6281 Muscle weakness (generalized): Secondary | ICD-10-CM | POA: Diagnosis not present

## 2020-05-02 DIAGNOSIS — M545 Low back pain: Secondary | ICD-10-CM | POA: Diagnosis not present

## 2020-05-02 DIAGNOSIS — R262 Difficulty in walking, not elsewhere classified: Secondary | ICD-10-CM | POA: Diagnosis not present

## 2020-05-02 DIAGNOSIS — R2681 Unsteadiness on feet: Secondary | ICD-10-CM | POA: Diagnosis not present

## 2020-05-03 DIAGNOSIS — R262 Difficulty in walking, not elsewhere classified: Secondary | ICD-10-CM | POA: Diagnosis not present

## 2020-05-03 DIAGNOSIS — R2681 Unsteadiness on feet: Secondary | ICD-10-CM | POA: Diagnosis not present

## 2020-05-03 DIAGNOSIS — M545 Low back pain: Secondary | ICD-10-CM | POA: Diagnosis not present

## 2020-05-03 DIAGNOSIS — S32010A Wedge compression fracture of first lumbar vertebra, initial encounter for closed fracture: Secondary | ICD-10-CM | POA: Diagnosis not present

## 2020-05-03 DIAGNOSIS — M6281 Muscle weakness (generalized): Secondary | ICD-10-CM | POA: Diagnosis not present

## 2020-05-04 DIAGNOSIS — R2681 Unsteadiness on feet: Secondary | ICD-10-CM | POA: Diagnosis not present

## 2020-05-04 DIAGNOSIS — R262 Difficulty in walking, not elsewhere classified: Secondary | ICD-10-CM | POA: Diagnosis not present

## 2020-05-04 DIAGNOSIS — M545 Low back pain: Secondary | ICD-10-CM | POA: Diagnosis not present

## 2020-05-04 DIAGNOSIS — M6281 Muscle weakness (generalized): Secondary | ICD-10-CM | POA: Diagnosis not present

## 2020-05-07 DIAGNOSIS — M6281 Muscle weakness (generalized): Secondary | ICD-10-CM | POA: Diagnosis not present

## 2020-05-07 DIAGNOSIS — R2681 Unsteadiness on feet: Secondary | ICD-10-CM | POA: Diagnosis not present

## 2020-05-07 DIAGNOSIS — Z1159 Encounter for screening for other viral diseases: Secondary | ICD-10-CM | POA: Diagnosis not present

## 2020-05-07 DIAGNOSIS — Z20828 Contact with and (suspected) exposure to other viral communicable diseases: Secondary | ICD-10-CM | POA: Diagnosis not present

## 2020-05-07 DIAGNOSIS — M545 Low back pain: Secondary | ICD-10-CM | POA: Diagnosis not present

## 2020-05-07 DIAGNOSIS — R262 Difficulty in walking, not elsewhere classified: Secondary | ICD-10-CM | POA: Diagnosis not present

## 2020-05-08 DIAGNOSIS — R2681 Unsteadiness on feet: Secondary | ICD-10-CM | POA: Diagnosis not present

## 2020-05-08 DIAGNOSIS — M6281 Muscle weakness (generalized): Secondary | ICD-10-CM | POA: Diagnosis not present

## 2020-05-08 DIAGNOSIS — M545 Low back pain: Secondary | ICD-10-CM | POA: Diagnosis not present

## 2020-05-08 DIAGNOSIS — R262 Difficulty in walking, not elsewhere classified: Secondary | ICD-10-CM | POA: Diagnosis not present

## 2020-05-09 DIAGNOSIS — R262 Difficulty in walking, not elsewhere classified: Secondary | ICD-10-CM | POA: Diagnosis not present

## 2020-05-09 DIAGNOSIS — M6281 Muscle weakness (generalized): Secondary | ICD-10-CM | POA: Diagnosis not present

## 2020-05-09 DIAGNOSIS — R2681 Unsteadiness on feet: Secondary | ICD-10-CM | POA: Diagnosis not present

## 2020-05-09 DIAGNOSIS — M545 Low back pain: Secondary | ICD-10-CM | POA: Diagnosis not present

## 2020-05-10 DIAGNOSIS — M545 Low back pain: Secondary | ICD-10-CM | POA: Diagnosis not present

## 2020-05-10 DIAGNOSIS — R262 Difficulty in walking, not elsewhere classified: Secondary | ICD-10-CM | POA: Diagnosis not present

## 2020-05-10 DIAGNOSIS — R2681 Unsteadiness on feet: Secondary | ICD-10-CM | POA: Diagnosis not present

## 2020-05-10 DIAGNOSIS — M6281 Muscle weakness (generalized): Secondary | ICD-10-CM | POA: Diagnosis not present

## 2020-05-11 ENCOUNTER — Other Ambulatory Visit: Payer: Self-pay | Admitting: Family Medicine

## 2020-05-14 DIAGNOSIS — Z1159 Encounter for screening for other viral diseases: Secondary | ICD-10-CM | POA: Diagnosis not present

## 2020-05-14 DIAGNOSIS — R2681 Unsteadiness on feet: Secondary | ICD-10-CM | POA: Diagnosis not present

## 2020-05-14 DIAGNOSIS — R262 Difficulty in walking, not elsewhere classified: Secondary | ICD-10-CM | POA: Diagnosis not present

## 2020-05-14 DIAGNOSIS — M545 Low back pain: Secondary | ICD-10-CM | POA: Diagnosis not present

## 2020-05-14 DIAGNOSIS — Z20828 Contact with and (suspected) exposure to other viral communicable diseases: Secondary | ICD-10-CM | POA: Diagnosis not present

## 2020-05-14 DIAGNOSIS — M6281 Muscle weakness (generalized): Secondary | ICD-10-CM | POA: Diagnosis not present

## 2020-05-16 DIAGNOSIS — M545 Low back pain: Secondary | ICD-10-CM | POA: Diagnosis not present

## 2020-05-16 DIAGNOSIS — R262 Difficulty in walking, not elsewhere classified: Secondary | ICD-10-CM | POA: Diagnosis not present

## 2020-05-16 DIAGNOSIS — M6281 Muscle weakness (generalized): Secondary | ICD-10-CM | POA: Diagnosis not present

## 2020-05-16 DIAGNOSIS — R2681 Unsteadiness on feet: Secondary | ICD-10-CM | POA: Diagnosis not present

## 2020-05-17 DIAGNOSIS — S32010A Wedge compression fracture of first lumbar vertebra, initial encounter for closed fracture: Secondary | ICD-10-CM | POA: Diagnosis not present

## 2020-05-18 ENCOUNTER — Inpatient Hospital Stay: Payer: Medicare Other | Admitting: Family Medicine

## 2020-05-21 ENCOUNTER — Telehealth: Payer: Self-pay | Admitting: Physical Medicine and Rehabilitation

## 2020-05-21 DIAGNOSIS — R262 Difficulty in walking, not elsewhere classified: Secondary | ICD-10-CM | POA: Diagnosis not present

## 2020-05-21 DIAGNOSIS — R2681 Unsteadiness on feet: Secondary | ICD-10-CM | POA: Diagnosis not present

## 2020-05-21 DIAGNOSIS — Z20828 Contact with and (suspected) exposure to other viral communicable diseases: Secondary | ICD-10-CM | POA: Diagnosis not present

## 2020-05-21 DIAGNOSIS — Z1159 Encounter for screening for other viral diseases: Secondary | ICD-10-CM | POA: Diagnosis not present

## 2020-05-21 DIAGNOSIS — M545 Low back pain: Secondary | ICD-10-CM | POA: Diagnosis not present

## 2020-05-21 DIAGNOSIS — M6281 Muscle weakness (generalized): Secondary | ICD-10-CM | POA: Diagnosis not present

## 2020-05-21 NOTE — Telephone Encounter (Signed)
Patient called needing to reschedule an appointment with Dr. Ernestina Patches. The number to contact patient is [phone]

## 2020-05-22 NOTE — Telephone Encounter (Signed)
Rescheduled for 8/16 at 1400 with driver.

## 2020-05-23 DIAGNOSIS — M545 Low back pain: Secondary | ICD-10-CM | POA: Diagnosis not present

## 2020-05-23 DIAGNOSIS — R2681 Unsteadiness on feet: Secondary | ICD-10-CM | POA: Diagnosis not present

## 2020-05-23 DIAGNOSIS — M6281 Muscle weakness (generalized): Secondary | ICD-10-CM | POA: Diagnosis not present

## 2020-05-23 DIAGNOSIS — R262 Difficulty in walking, not elsewhere classified: Secondary | ICD-10-CM | POA: Diagnosis not present

## 2020-05-25 DIAGNOSIS — R262 Difficulty in walking, not elsewhere classified: Secondary | ICD-10-CM | POA: Diagnosis not present

## 2020-05-25 DIAGNOSIS — M545 Low back pain: Secondary | ICD-10-CM | POA: Diagnosis not present

## 2020-05-25 DIAGNOSIS — M6281 Muscle weakness (generalized): Secondary | ICD-10-CM | POA: Diagnosis not present

## 2020-05-25 DIAGNOSIS — R2681 Unsteadiness on feet: Secondary | ICD-10-CM | POA: Diagnosis not present

## 2020-05-28 ENCOUNTER — Ambulatory Visit: Payer: Medicare Other | Admitting: Physician Assistant

## 2020-05-28 DIAGNOSIS — M545 Low back pain: Secondary | ICD-10-CM | POA: Diagnosis not present

## 2020-05-28 DIAGNOSIS — R2681 Unsteadiness on feet: Secondary | ICD-10-CM | POA: Diagnosis not present

## 2020-05-28 DIAGNOSIS — R262 Difficulty in walking, not elsewhere classified: Secondary | ICD-10-CM | POA: Diagnosis not present

## 2020-05-28 DIAGNOSIS — M6281 Muscle weakness (generalized): Secondary | ICD-10-CM | POA: Diagnosis not present

## 2020-05-30 DIAGNOSIS — R2681 Unsteadiness on feet: Secondary | ICD-10-CM | POA: Diagnosis not present

## 2020-05-30 DIAGNOSIS — R262 Difficulty in walking, not elsewhere classified: Secondary | ICD-10-CM | POA: Diagnosis not present

## 2020-05-30 DIAGNOSIS — M545 Low back pain: Secondary | ICD-10-CM | POA: Diagnosis not present

## 2020-05-30 DIAGNOSIS — M6281 Muscle weakness (generalized): Secondary | ICD-10-CM | POA: Diagnosis not present

## 2020-06-01 DIAGNOSIS — R262 Difficulty in walking, not elsewhere classified: Secondary | ICD-10-CM | POA: Diagnosis not present

## 2020-06-01 DIAGNOSIS — M545 Low back pain: Secondary | ICD-10-CM | POA: Diagnosis not present

## 2020-06-01 DIAGNOSIS — M6281 Muscle weakness (generalized): Secondary | ICD-10-CM | POA: Diagnosis not present

## 2020-06-01 DIAGNOSIS — R2681 Unsteadiness on feet: Secondary | ICD-10-CM | POA: Diagnosis not present

## 2020-06-02 ENCOUNTER — Inpatient Hospital Stay (HOSPITAL_COMMUNITY): Payer: Medicare Other | Admitting: Anesthesiology

## 2020-06-02 ENCOUNTER — Emergency Department (HOSPITAL_COMMUNITY): Payer: Medicare Other

## 2020-06-02 ENCOUNTER — Encounter (HOSPITAL_COMMUNITY)
Admission: EM | Disposition: A | Discharge status: Skilled Nursing Facility | Payer: Self-pay | Source: Home / Self Care | Attending: Family Medicine

## 2020-06-02 ENCOUNTER — Encounter (HOSPITAL_COMMUNITY): Payer: Self-pay | Admitting: Internal Medicine

## 2020-06-02 ENCOUNTER — Inpatient Hospital Stay (HOSPITAL_COMMUNITY)
Admission: EM | Admit: 2020-06-02 | Discharge: 2020-06-07 | DRG: 481 | Disposition: A | Discharge status: Skilled Nursing Facility | Payer: Medicare Other | Attending: Family Medicine | Admitting: Family Medicine

## 2020-06-02 ENCOUNTER — Other Ambulatory Visit: Payer: Self-pay

## 2020-06-02 ENCOUNTER — Inpatient Hospital Stay (HOSPITAL_COMMUNITY): Payer: Medicare Other

## 2020-06-02 DIAGNOSIS — W1830XA Fall on same level, unspecified, initial encounter: Secondary | ICD-10-CM | POA: Diagnosis present

## 2020-06-02 DIAGNOSIS — M25552 Pain in left hip: Secondary | ICD-10-CM | POA: Diagnosis not present

## 2020-06-02 DIAGNOSIS — Z888 Allergy status to other drugs, medicaments and biological substances status: Secondary | ICD-10-CM

## 2020-06-02 DIAGNOSIS — R262 Difficulty in walking, not elsewhere classified: Secondary | ICD-10-CM | POA: Diagnosis not present

## 2020-06-02 DIAGNOSIS — F419 Anxiety disorder, unspecified: Secondary | ICD-10-CM | POA: Diagnosis present

## 2020-06-02 DIAGNOSIS — E7800 Pure hypercholesterolemia, unspecified: Secondary | ICD-10-CM | POA: Diagnosis present

## 2020-06-02 DIAGNOSIS — R2681 Unsteadiness on feet: Secondary | ICD-10-CM | POA: Diagnosis not present

## 2020-06-02 DIAGNOSIS — S299XXA Unspecified injury of thorax, initial encounter: Secondary | ICD-10-CM | POA: Diagnosis not present

## 2020-06-02 DIAGNOSIS — Z9181 History of falling: Secondary | ICD-10-CM | POA: Diagnosis not present

## 2020-06-02 DIAGNOSIS — M6281 Muscle weakness (generalized): Secondary | ICD-10-CM | POA: Diagnosis not present

## 2020-06-02 DIAGNOSIS — I6782 Cerebral ischemia: Secondary | ICD-10-CM | POA: Diagnosis not present

## 2020-06-02 DIAGNOSIS — D62 Acute posthemorrhagic anemia: Secondary | ICD-10-CM | POA: Diagnosis not present

## 2020-06-02 DIAGNOSIS — R5381 Other malaise: Secondary | ICD-10-CM | POA: Diagnosis not present

## 2020-06-02 DIAGNOSIS — M419 Scoliosis, unspecified: Secondary | ICD-10-CM | POA: Diagnosis not present

## 2020-06-02 DIAGNOSIS — Z79899 Other long term (current) drug therapy: Secondary | ICD-10-CM | POA: Diagnosis not present

## 2020-06-02 DIAGNOSIS — I10 Essential (primary) hypertension: Secondary | ICD-10-CM | POA: Diagnosis present

## 2020-06-02 DIAGNOSIS — E559 Vitamin D deficiency, unspecified: Secondary | ICD-10-CM | POA: Diagnosis present

## 2020-06-02 DIAGNOSIS — W19XXXA Unspecified fall, initial encounter: Secondary | ICD-10-CM

## 2020-06-02 DIAGNOSIS — S7292XK Unspecified fracture of left femur, subsequent encounter for closed fracture with nonunion: Secondary | ICD-10-CM

## 2020-06-02 DIAGNOSIS — Z66 Do not resuscitate: Secondary | ICD-10-CM | POA: Diagnosis present

## 2020-06-02 DIAGNOSIS — S72145K Nondisplaced intertrochanteric fracture of left femur, subsequent encounter for closed fracture with nonunion: Secondary | ICD-10-CM | POA: Diagnosis not present

## 2020-06-02 DIAGNOSIS — S72332A Displaced oblique fracture of shaft of left femur, initial encounter for closed fracture: Principal | ICD-10-CM

## 2020-06-02 DIAGNOSIS — K219 Gastro-esophageal reflux disease without esophagitis: Secondary | ICD-10-CM | POA: Diagnosis present

## 2020-06-02 DIAGNOSIS — F329 Major depressive disorder, single episode, unspecified: Secondary | ICD-10-CM | POA: Diagnosis present

## 2020-06-02 DIAGNOSIS — F5104 Psychophysiologic insomnia: Secondary | ICD-10-CM | POA: Diagnosis present

## 2020-06-02 DIAGNOSIS — R41841 Cognitive communication deficit: Secondary | ICD-10-CM | POA: Diagnosis not present

## 2020-06-02 DIAGNOSIS — S3992XA Unspecified injury of lower back, initial encounter: Secondary | ICD-10-CM | POA: Diagnosis not present

## 2020-06-02 DIAGNOSIS — R52 Pain, unspecified: Secondary | ICD-10-CM | POA: Diagnosis not present

## 2020-06-02 DIAGNOSIS — G894 Chronic pain syndrome: Secondary | ICD-10-CM | POA: Diagnosis present

## 2020-06-02 DIAGNOSIS — Z4789 Encounter for other orthopedic aftercare: Secondary | ICD-10-CM | POA: Diagnosis not present

## 2020-06-02 DIAGNOSIS — G43909 Migraine, unspecified, not intractable, without status migrainosus: Secondary | ICD-10-CM | POA: Diagnosis present

## 2020-06-02 DIAGNOSIS — F411 Generalized anxiety disorder: Secondary | ICD-10-CM

## 2020-06-02 DIAGNOSIS — M4856XA Collapsed vertebra, not elsewhere classified, lumbar region, initial encounter for fracture: Secondary | ICD-10-CM | POA: Diagnosis present

## 2020-06-02 DIAGNOSIS — S72332S Displaced oblique fracture of shaft of left femur, sequela: Secondary | ICD-10-CM | POA: Diagnosis not present

## 2020-06-02 DIAGNOSIS — E785 Hyperlipidemia, unspecified: Secondary | ICD-10-CM | POA: Diagnosis not present

## 2020-06-02 DIAGNOSIS — F418 Other specified anxiety disorders: Secondary | ICD-10-CM | POA: Diagnosis not present

## 2020-06-02 DIAGNOSIS — Z20822 Contact with and (suspected) exposure to covid-19: Secondary | ICD-10-CM | POA: Diagnosis present

## 2020-06-02 DIAGNOSIS — M1990 Unspecified osteoarthritis, unspecified site: Secondary | ICD-10-CM | POA: Diagnosis present

## 2020-06-02 DIAGNOSIS — S72352A Displaced comminuted fracture of shaft of left femur, initial encounter for closed fracture: Secondary | ICD-10-CM | POA: Diagnosis not present

## 2020-06-02 DIAGNOSIS — M2550 Pain in unspecified joint: Secondary | ICD-10-CM | POA: Diagnosis not present

## 2020-06-02 DIAGNOSIS — R2242 Localized swelling, mass and lump, left lower limb: Secondary | ICD-10-CM | POA: Diagnosis not present

## 2020-06-02 DIAGNOSIS — Z419 Encounter for procedure for purposes other than remedying health state, unspecified: Secondary | ICD-10-CM

## 2020-06-02 DIAGNOSIS — M21962 Unspecified acquired deformity of left lower leg: Secondary | ICD-10-CM | POA: Diagnosis not present

## 2020-06-02 DIAGNOSIS — S72142A Displaced intertrochanteric fracture of left femur, initial encounter for closed fracture: Secondary | ICD-10-CM

## 2020-06-02 DIAGNOSIS — S72301D Unspecified fracture of shaft of right femur, subsequent encounter for closed fracture with routine healing: Secondary | ICD-10-CM | POA: Diagnosis not present

## 2020-06-02 DIAGNOSIS — S72302A Unspecified fracture of shaft of left femur, initial encounter for closed fracture: Secondary | ICD-10-CM | POA: Diagnosis not present

## 2020-06-02 DIAGNOSIS — S0990XA Unspecified injury of head, initial encounter: Secondary | ICD-10-CM | POA: Diagnosis not present

## 2020-06-02 DIAGNOSIS — R0902 Hypoxemia: Secondary | ICD-10-CM | POA: Diagnosis not present

## 2020-06-02 DIAGNOSIS — F32A Depression, unspecified: Secondary | ICD-10-CM | POA: Diagnosis present

## 2020-06-02 DIAGNOSIS — S72402A Unspecified fracture of lower end of left femur, initial encounter for closed fracture: Secondary | ICD-10-CM | POA: Diagnosis not present

## 2020-06-02 DIAGNOSIS — R2689 Other abnormalities of gait and mobility: Secondary | ICD-10-CM | POA: Diagnosis not present

## 2020-06-02 DIAGNOSIS — R609 Edema, unspecified: Secondary | ICD-10-CM | POA: Diagnosis not present

## 2020-06-02 DIAGNOSIS — Z7401 Bed confinement status: Secondary | ICD-10-CM | POA: Diagnosis not present

## 2020-06-02 DIAGNOSIS — F29 Unspecified psychosis not due to a substance or known physiological condition: Secondary | ICD-10-CM | POA: Diagnosis not present

## 2020-06-02 HISTORY — PX: FEMUR IM NAIL: SHX1597

## 2020-06-02 LAB — CBC WITH DIFFERENTIAL/PLATELET
Abs Immature Granulocytes: 0.03 10*3/uL (ref 0.00–0.07)
Basophils Absolute: 0.1 10*3/uL (ref 0.0–0.1)
Basophils Relative: 1 %
Eosinophils Absolute: 0 10*3/uL (ref 0.0–0.5)
Eosinophils Relative: 0 %
HCT: 36.8 % (ref 36.0–46.0)
Hemoglobin: 11.8 g/dL — ABNORMAL LOW (ref 12.0–15.0)
Immature Granulocytes: 0 %
Lymphocytes Relative: 10 %
Lymphs Abs: 1.1 10*3/uL (ref 0.7–4.0)
MCH: 30.4 pg (ref 26.0–34.0)
MCHC: 32.1 g/dL (ref 30.0–36.0)
MCV: 94.8 fL (ref 80.0–100.0)
Monocytes Absolute: 0.6 10*3/uL (ref 0.1–1.0)
Monocytes Relative: 5 %
Neutro Abs: 9.3 10*3/uL — ABNORMAL HIGH (ref 1.7–7.7)
Neutrophils Relative %: 84 %
Platelets: 269 10*3/uL (ref 150–400)
RBC: 3.88 MIL/uL (ref 3.87–5.11)
RDW: 14.1 % (ref 11.5–15.5)
WBC: 11 10*3/uL — ABNORMAL HIGH (ref 4.0–10.5)
nRBC: 0 % (ref 0.0–0.2)

## 2020-06-02 LAB — BASIC METABOLIC PANEL
Anion gap: 7 (ref 5–15)
BUN: 8 mg/dL (ref 8–23)
CO2: 23 mmol/L (ref 22–32)
Calcium: 8.7 mg/dL — ABNORMAL LOW (ref 8.9–10.3)
Chloride: 107 mmol/L (ref 98–111)
Creatinine, Ser: 0.86 mg/dL (ref 0.44–1.00)
GFR calc Af Amer: >60 mL/min (ref >60)
GFR calc non Af Amer: >60 mL/min (ref >60)
Glucose, Bld: 126 mg/dL — ABNORMAL HIGH (ref 70–99)
Potassium: 4.7 mmol/L (ref 3.5–5.1)
Sodium: 137 mmol/L (ref 135–145)

## 2020-06-02 LAB — PROTIME-INR
INR: 1.1 (ref 0.8–1.2)
Prothrombin Time: 13.4 seconds (ref 11.4–15.2)

## 2020-06-02 LAB — ABO/RH: ABO/RH(D): A NEG

## 2020-06-02 LAB — SARS CORONAVIRUS 2 BY RT PCR (HOSPITAL ORDER, PERFORMED IN ~~LOC~~ HOSPITAL LAB): SARS Coronavirus 2: NEGATIVE

## 2020-06-02 SURGERY — INSERTION, INTRAMEDULLARY ROD, FEMUR, RETROGRADE
Anesthesia: General | Site: Hip | Laterality: Left

## 2020-06-02 MED ORDER — ALPRAZOLAM 1 MG PO TABS: 1.0000 mg | ORAL_TABLET | Freq: Every day | ORAL | Status: DC

## 2020-06-02 MED ORDER — DEXAMETHASONE SODIUM PHOSPHATE 10 MG/ML IJ SOLN
INTRAMUSCULAR | Status: DC | PRN
  Administered 2020-06-02: 5 mg via INTRAVENOUS

## 2020-06-02 MED ORDER — CHLORHEXIDINE GLUCONATE 0.12 % MT SOLN: 15.0000 mL | Freq: Once | OROMUCOSAL | Status: AC

## 2020-06-02 MED ORDER — ACETAMINOPHEN 160 MG/5ML PO SOLN: 1000.0000 mg | Freq: Once | ORAL | Status: DC | PRN

## 2020-06-02 MED ORDER — FENTANYL CITRATE (PF) 100 MCG/2ML IJ SOLN: 50.0000 ug | Freq: Once | INTRAMUSCULAR | Status: AC

## 2020-06-02 MED ORDER — RIBOFLAVIN 400 MG PO CAPS: 400.0000 mg | ORAL_CAPSULE | Freq: Every day | ORAL | Status: DC

## 2020-06-02 MED ORDER — RIZATRIPTAN BENZOATE 10 MG PO TBDP: 10.0000 mg | ORAL_TABLET | ORAL | Status: DC | PRN

## 2020-06-02 MED ORDER — FENTANYL CITRATE (PF) 250 MCG/5ML IJ SOLN
INTRAMUSCULAR | Status: AC
  Filled 2020-06-02: qty 5

## 2020-06-02 MED ORDER — CEFAZOLIN SODIUM-DEXTROSE 2-4 GM/100ML-% IV SOLN
INTRAVENOUS | Status: AC
  Filled 2020-06-02: qty 100

## 2020-06-02 MED ORDER — ONDANSETRON HCL 4 MG/2ML IJ SOLN
INTRAMUSCULAR | Status: AC
  Filled 2020-06-02: qty 2

## 2020-06-02 MED ORDER — CHLORHEXIDINE GLUCONATE 4 % EX LIQD: 60.0000 mL | Freq: Once | CUTANEOUS | Status: DC

## 2020-06-02 MED ORDER — LIDOCAINE 2% (20 MG/ML) 5 ML SYRINGE
INTRAMUSCULAR | Status: AC
  Filled 2020-06-02: qty 5

## 2020-06-02 MED ORDER — ONDANSETRON HCL 4 MG/2ML IJ SOLN: 4.0000 mg | Freq: Four times a day (QID) | INTRAMUSCULAR | Status: DC | PRN

## 2020-06-02 MED ORDER — ACETAMINOPHEN 500 MG PO TABS: 1000.0000 mg | ORAL_TABLET | Freq: Once | ORAL | Status: DC | PRN

## 2020-06-02 MED ORDER — HYDRALAZINE HCL 20 MG/ML IJ SOLN: 10.0000 mg | Freq: Four times a day (QID) | INTRAMUSCULAR | Status: DC | PRN

## 2020-06-02 MED ORDER — OXYCODONE HCL 5 MG/5ML PO SOLN: 5.0000 mg | Freq: Once | ORAL | Status: DC | PRN

## 2020-06-02 MED ORDER — TIZANIDINE HCL 4 MG PO TABS
4.0000 mg | ORAL_TABLET | Freq: Every day | ORAL | Status: DC
  Administered 2020-06-02 – 2020-06-06 (×5): 4 mg via ORAL
  Filled 2020-06-02 (×5): qty 1

## 2020-06-02 MED ORDER — ACETAMINOPHEN 10 MG/ML IV SOLN: 1000.0000 mg | Freq: Once | INTRAVENOUS | Status: DC | PRN

## 2020-06-02 MED ORDER — OXYCODONE HCL 5 MG PO TABS: 5.0000 mg | ORAL_TABLET | Freq: Once | ORAL | Status: DC | PRN

## 2020-06-02 MED ORDER — MENTHOL 3 MG MT LOZG: 1.0000 | LOZENGE | OROMUCOSAL | Status: DC | PRN

## 2020-06-02 MED ORDER — LIDOCAINE 2% (20 MG/ML) 5 ML SYRINGE
INTRAMUSCULAR | Status: DC | PRN
  Administered 2020-06-02: 50 mg via INTRAVENOUS

## 2020-06-02 MED ORDER — VANCOMYCIN HCL 1000 MG IV SOLR
INTRAVENOUS | Status: AC
  Filled 2020-06-02: qty 1000

## 2020-06-02 MED ORDER — GABAPENTIN 300 MG PO CAPS
300.0000 mg | ORAL_CAPSULE | Freq: Every day | ORAL | Status: DC
  Administered 2020-06-02 – 2020-06-04 (×3): 300 mg via ORAL
  Filled 2020-06-02 (×3): qty 1

## 2020-06-02 MED ORDER — ROCURONIUM BROMIDE 10 MG/ML (PF) SYRINGE
PREFILLED_SYRINGE | INTRAVENOUS | Status: DC | PRN
  Administered 2020-06-02: 60 mg via INTRAVENOUS

## 2020-06-02 MED ORDER — ENSURE PRE-SURGERY PO LIQD: 296.0000 mL | Freq: Once | ORAL | Status: DC

## 2020-06-02 MED ORDER — ONDANSETRON HCL 4 MG PO TABS: 4.0000 mg | ORAL_TABLET | Freq: Four times a day (QID) | ORAL | Status: DC | PRN

## 2020-06-02 MED ORDER — POVIDONE-IODINE 10 % EX SWAB: 2.0000 "application " | Freq: Once | CUTANEOUS | Status: DC

## 2020-06-02 MED ORDER — VERAPAMIL HCL ER 180 MG PO TBCR
180.0000 mg | EXTENDED_RELEASE_TABLET | Freq: Every day | ORAL | Status: DC
  Filled 2020-06-02 (×2): qty 1

## 2020-06-02 MED ORDER — CEFAZOLIN SODIUM-DEXTROSE 2-4 GM/100ML-% IV SOLN
2.0000 g | INTRAVENOUS | Status: AC
  Administered 2020-06-02: 2 g via INTRAVENOUS

## 2020-06-02 MED ORDER — CEFAZOLIN SODIUM-DEXTROSE 2-4 GM/100ML-% IV SOLN
2.0000 g | Freq: Four times a day (QID) | INTRAVENOUS | Status: AC
  Administered 2020-06-02 – 2020-06-03 (×2): 2 g via INTRAVENOUS
  Filled 2020-06-02 (×2): qty 100

## 2020-06-02 MED ORDER — 0.9 % SODIUM CHLORIDE (POUR BTL) OPTIME
TOPICAL | Status: DC | PRN
  Administered 2020-06-02: 1000 mL

## 2020-06-02 MED ORDER — VANCOMYCIN HCL 1000 MG IV SOLR
INTRAVENOUS | Status: DC | PRN
  Administered 2020-06-02: 1000 mg

## 2020-06-02 MED ORDER — FENTANYL CITRATE (PF) 100 MCG/2ML IJ SOLN
INTRAMUSCULAR | Status: AC
  Filled 2020-06-02: qty 2

## 2020-06-02 MED ORDER — ARTIFICIAL TEARS OPHTHALMIC OINT
TOPICAL_OINTMENT | OPHTHALMIC | Status: AC
  Filled 2020-06-02: qty 3.5

## 2020-06-02 MED ORDER — PHENOL 1.4 % MT LIQD: 1.0000 | OROMUCOSAL | Status: DC | PRN

## 2020-06-02 MED ORDER — CHLORHEXIDINE GLUCONATE 0.12 % MT SOLN
OROMUCOSAL | Status: AC
  Administered 2020-06-02: 15 mL via OROMUCOSAL
  Filled 2020-06-02: qty 15

## 2020-06-02 MED ORDER — FENTANYL CITRATE (PF) 100 MCG/2ML IJ SOLN
INTRAMUSCULAR | Status: AC
  Administered 2020-06-02: 50 ug via INTRAVENOUS
  Filled 2020-06-02: qty 2

## 2020-06-02 MED ORDER — PROPOFOL 10 MG/ML IV BOLUS
INTRAVENOUS | Status: DC | PRN
  Administered 2020-06-02: 20 mg via INTRAVENOUS
  Administered 2020-06-02: 30 mg via INTRAVENOUS
  Administered 2020-06-02: 60 mg via INTRAVENOUS

## 2020-06-02 MED ORDER — DEXAMETHASONE SODIUM PHOSPHATE 10 MG/ML IJ SOLN
INTRAMUSCULAR | Status: AC
  Filled 2020-06-02: qty 1

## 2020-06-02 MED ORDER — ONDANSETRON HCL 4 MG/2ML IJ SOLN
INTRAMUSCULAR | Status: DC | PRN
  Administered 2020-06-02: 4 mg via INTRAVENOUS

## 2020-06-02 MED ORDER — FENTANYL CITRATE (PF) 100 MCG/2ML IJ SOLN
INTRAMUSCULAR | Status: DC | PRN
  Administered 2020-06-02 (×8): 25 ug via INTRAVENOUS
  Administered 2020-06-02: 50 ug via INTRAVENOUS

## 2020-06-02 MED ORDER — SENNA 8.6 MG PO TABS
1.0000 | ORAL_TABLET | Freq: Two times a day (BID) | ORAL | Status: DC
  Administered 2020-06-02 – 2020-06-07 (×7): 8.6 mg via ORAL
  Filled 2020-06-02 (×9): qty 1

## 2020-06-02 MED ORDER — ENOXAPARIN SODIUM 40 MG/0.4ML ~~LOC~~ SOLN
40.0000 mg | SUBCUTANEOUS | Status: DC
  Administered 2020-06-03 – 2020-06-07 (×5): 40 mg via SUBCUTANEOUS
  Filled 2020-06-02 (×5): qty 0.4

## 2020-06-02 MED ORDER — MORPHINE SULFATE (PF) 2 MG/ML IV SOLN
0.5000 mg | INTRAVENOUS | Status: DC | PRN
  Administered 2020-06-02 – 2020-06-03 (×2): 0.5 mg via INTRAVENOUS
  Filled 2020-06-02 (×2): qty 1

## 2020-06-02 MED ORDER — METOCLOPRAMIDE HCL 5 MG PO TABS: 5.0000 mg | ORAL_TABLET | Freq: Three times a day (TID) | ORAL | Status: DC | PRN

## 2020-06-02 MED ORDER — DOCUSATE SODIUM 100 MG PO CAPS
100.0000 mg | ORAL_CAPSULE | Freq: Two times a day (BID) | ORAL | Status: DC
  Administered 2020-06-03 – 2020-06-07 (×9): 100 mg via ORAL
  Filled 2020-06-02 (×9): qty 1

## 2020-06-02 MED ORDER — TRANEXAMIC ACID-NACL 1000-0.7 MG/100ML-% IV SOLN: 1000.0000 mg | INTRAVENOUS | Status: DC

## 2020-06-02 MED ORDER — LACTATED RINGERS IV SOLN: INTRAVENOUS | Status: DC

## 2020-06-02 MED ORDER — PHENYLEPHRINE 40 MCG/ML (10ML) SYRINGE FOR IV PUSH (FOR BLOOD PRESSURE SUPPORT)
PREFILLED_SYRINGE | INTRAVENOUS | Status: AC
  Filled 2020-06-02: qty 10

## 2020-06-02 MED ORDER — PROPOFOL 10 MG/ML IV BOLUS
INTRAVENOUS | Status: AC
  Filled 2020-06-02: qty 20

## 2020-06-02 MED ORDER — ROCURONIUM BROMIDE 10 MG/ML (PF) SYRINGE
PREFILLED_SYRINGE | INTRAVENOUS | Status: AC
  Filled 2020-06-02: qty 10

## 2020-06-02 MED ORDER — OXYBUTYNIN CHLORIDE ER 10 MG PO TB24
10.0000 mg | ORAL_TABLET | Freq: Every day | ORAL | Status: DC
  Administered 2020-06-03 – 2020-06-06 (×4): 10 mg via ORAL
  Filled 2020-06-02 (×6): qty 1

## 2020-06-02 MED ORDER — SUGAMMADEX SODIUM 200 MG/2ML IV SOLN
INTRAVENOUS | Status: DC | PRN
  Administered 2020-06-02: 140 mg via INTRAVENOUS

## 2020-06-02 MED ORDER — VITAMIN D (ERGOCALCIFEROL) 1.25 MG (50000 UNIT) PO CAPS
50000.0000 [IU] | ORAL_CAPSULE | ORAL | Status: DC
  Filled 2020-06-02: qty 1

## 2020-06-02 MED ORDER — METOCLOPRAMIDE HCL 5 MG/ML IJ SOLN: 5.0000 mg | Freq: Three times a day (TID) | INTRAMUSCULAR | Status: DC | PRN

## 2020-06-02 MED ORDER — MAGNESIUM OXIDE 400 (241.3 MG) MG PO TABS
400.0000 mg | ORAL_TABLET | Freq: Every day | ORAL | Status: DC
  Administered 2020-06-02 – 2020-06-06 (×5): 400 mg via ORAL
  Filled 2020-06-02 (×5): qty 1

## 2020-06-02 MED ORDER — PHENYLEPHRINE HCL-NACL 10-0.9 MG/250ML-% IV SOLN
INTRAVENOUS | Status: DC | PRN
Start: 2020-06-02 — End: 2020-06-02
  Administered 2020-06-02: 15 ug/min via INTRAVENOUS

## 2020-06-02 MED ORDER — PANTOPRAZOLE SODIUM 40 MG PO TBEC
40.0000 mg | DELAYED_RELEASE_TABLET | Freq: Every day | ORAL | Status: DC
  Administered 2020-06-03 – 2020-06-07 (×5): 40 mg via ORAL
  Filled 2020-06-02 (×5): qty 1

## 2020-06-02 MED ORDER — FENTANYL CITRATE (PF) 100 MCG/2ML IJ SOLN
25.0000 ug | INTRAMUSCULAR | Status: AC | PRN
  Administered 2020-06-02: 50 ug via INTRAVENOUS
  Administered 2020-06-02 (×3): 25 ug via INTRAVENOUS
  Administered 2020-06-02: 50 ug via INTRAVENOUS
  Administered 2020-06-02: 25 ug via INTRAVENOUS

## 2020-06-02 MED ORDER — HYDROMORPHONE HCL 1 MG/ML IJ SOLN
1.0000 mg | Freq: Once | INTRAMUSCULAR | Status: AC
  Administered 2020-06-02: 1 mg via INTRAVENOUS
  Filled 2020-06-02: qty 1

## 2020-06-02 MED ORDER — HYDROCODONE-ACETAMINOPHEN 5-325 MG PO TABS
1.0000 | ORAL_TABLET | Freq: Four times a day (QID) | ORAL | Status: DC | PRN
  Administered 2020-06-02: 1 via ORAL
  Administered 2020-06-03 (×2): 2 via ORAL
  Administered 2020-06-03 – 2020-06-04 (×2): 1 via ORAL
  Filled 2020-06-02: qty 1
  Filled 2020-06-02 (×3): qty 2
  Filled 2020-06-02: qty 1

## 2020-06-02 MED ORDER — PREGABALIN 100 MG PO CAPS
100.0000 mg | ORAL_CAPSULE | Freq: Every day | ORAL | Status: DC
  Administered 2020-06-03 – 2020-06-07 (×5): 100 mg via ORAL
  Filled 2020-06-02 (×5): qty 1

## 2020-06-02 SURGICAL SUPPLY — 78 items
ADH SKN CLS APL DERMABOND .7 (GAUZE/BANDAGES/DRESSINGS) ×4
ALCOHOL 70% 16 OZ (MISCELLANEOUS) ×5 IMPLANT
APL PRP STRL LF DISP 70% ISPRP (MISCELLANEOUS) ×4
BIT DRILL CALIBRATED 4.3MMX365 (DRILL) ×1 IMPLANT
BIT DRILL CROWE PNT TWST 4.5MM (DRILL) ×1 IMPLANT
BLADE SURG 15 STRL LF DISP TIS (BLADE) ×2 IMPLANT
BLADE SURG 15 STRL SS (BLADE) ×4
BNDG COHESIVE 4X5 TAN STRL (GAUZE/BANDAGES/DRESSINGS) ×3 IMPLANT
BNDG COHESIVE 6X5 TAN STRL LF (GAUZE/BANDAGES/DRESSINGS) ×3 IMPLANT
CHLORAPREP W/TINT 26 (MISCELLANEOUS) ×7 IMPLANT
COVER SURGICAL LIGHT HANDLE (MISCELLANEOUS) ×8 IMPLANT
DERMABOND ADVANCED (GAUZE/BANDAGES/DRESSINGS) ×4
DERMABOND ADVANCED .7 DNX12 (GAUZE/BANDAGES/DRESSINGS) ×2 IMPLANT
DRAPE C-ARM 42X72 X-RAY (DRAPES) ×8 IMPLANT
DRAPE C-ARMOR (DRAPES) ×8 IMPLANT
DRAPE HALF SHEET 40X57 (DRAPES) ×6 IMPLANT
DRAPE IMP U-DRAPE 54X76 (DRAPES) ×7 IMPLANT
DRAPE INCISE IOBAN 66X45 STRL (DRAPES) ×4 IMPLANT
DRAPE STERI IOBAN 125X83 (DRAPES) ×4 IMPLANT
DRAPE SURG 17X23 STRL (DRAPES) ×3 IMPLANT
DRAPE U-SHAPE 47X51 STRL (DRAPES) ×4 IMPLANT
DRILL CALIBRATED 4.3MMX365 (DRILL) ×4
DRILL CROWE POINT TWIST 4.5MM (DRILL) ×4
DRSG MEPILEX BORDER 4X4 (GAUZE/BANDAGES/DRESSINGS) ×9 IMPLANT
DRSG MEPILEX BORDER 4X8 (GAUZE/BANDAGES/DRESSINGS) ×3 IMPLANT
ELECT REM PT RETURN 9FT ADLT (ELECTROSURGICAL) ×8
ELECTRODE REM PT RTRN 9FT ADLT (ELECTROSURGICAL) ×4 IMPLANT
FACESHIELD WRAPAROUND (MASK) ×4 IMPLANT
FACESHIELD WRAPAROUND OR TEAM (MASK) ×1 IMPLANT
GLOVE BIO SURGEON STRL SZ8.5 (GLOVE) ×8 IMPLANT
GLOVE BIOGEL PI IND STRL 7.0 (GLOVE) ×2 IMPLANT
GLOVE BIOGEL PI IND STRL 8.5 (GLOVE) ×2 IMPLANT
GLOVE BIOGEL PI INDICATOR 7.0 (GLOVE) ×2
GLOVE BIOGEL PI INDICATOR 8.5 (GLOVE) ×2
GLOVE ECLIPSE 7.0 STRL STRAW (GLOVE) ×4 IMPLANT
GLOVE SKINSENSE NS SZ7.5 (GLOVE) ×2
GLOVE SKINSENSE STRL SZ7.5 (GLOVE) ×2 IMPLANT
GLOVE SURG SYN 7.5  E (GLOVE) ×4
GLOVE SURG SYN 7.5 E (GLOVE) ×2 IMPLANT
GLOVE SURG SYN 7.5 PF PI (GLOVE) ×1 IMPLANT
GOWN STRL REUS W/ TWL LRG LVL3 (GOWN DISPOSABLE) ×6 IMPLANT
GOWN STRL REUS W/TWL 2XL LVL3 (GOWN DISPOSABLE) ×4 IMPLANT
GOWN STRL REUS W/TWL LRG LVL3 (GOWN DISPOSABLE) ×8
GUIDEPIN 3.2X17.5 THRD DISP (PIN) ×6 IMPLANT
GUIDEWIRE BEAD TIP (WIRE) ×3 IMPLANT
KIT BASIN OR (CUSTOM PROCEDURE TRAY) ×4 IMPLANT
KIT TURNOVER KIT B (KITS) ×5 IMPLANT
MANIFOLD NEPTUNE II (INSTRUMENTS) ×5 IMPLANT
NAIL FEM RETRO 10.5X360 (Nail) ×3 IMPLANT
NDL HYPO 25GX1X1/2 BEV (NEEDLE) ×1 IMPLANT
NEEDLE HYPO 25GX1X1/2 BEV (NEEDLE) IMPLANT
NS IRRIG 1000ML POUR BTL (IV SOLUTION) ×7 IMPLANT
PACK ORTHO EXTREMITY (CUSTOM PROCEDURE TRAY) ×4 IMPLANT
PAD ARMBOARD 7.5X6 YLW CONV (MISCELLANEOUS) ×10 IMPLANT
SCREW CORT TI DBL LEAD 5X30 (Screw) ×3 IMPLANT
SCREW CORT TI DBL LEAD 5X34 (Screw) ×3 IMPLANT
SCREW CORT TI DBL LEAD 5X46 (Screw) ×3 IMPLANT
SCREW CORT TI DBL LEAD 5X60 (Screw) ×3 IMPLANT
SCREW CORT TI DBL LEAD 5X70 (Screw) ×3 IMPLANT
SCREW CORT TI DBL LEAD 5X75 (Screw) ×3 IMPLANT
SCREW CORT TI DBLE LEAD 5X54 (Screw) ×3 IMPLANT
STAPLER VISISTAT 35W (STAPLE) ×3 IMPLANT
STOCKINETTE 6  STRL (DRAPES) ×4
STOCKINETTE 6 STRL (DRAPES) ×2 IMPLANT
SUT MNCRL AB 3-0 PS2 27 (SUTURE) ×1 IMPLANT
SUT MON AB 2-0 CT1 27 (SUTURE) ×4 IMPLANT
SUT VIC AB 0 CT1 27 (SUTURE) ×4
SUT VIC AB 0 CT1 27XBRD ANBCTR (SUTURE) ×1 IMPLANT
SUT VIC AB 1 CT1 27 (SUTURE)
SUT VIC AB 1 CT1 27XBRD ANBCTR (SUTURE) ×1 IMPLANT
SUT VIC AB 2-0 CT1 27 (SUTURE) ×4
SUT VIC AB 2-0 CT1 TAPERPNT 27 (SUTURE) ×1 IMPLANT
TOWEL GREEN STERILE (TOWEL DISPOSABLE) ×8 IMPLANT
TOWEL GREEN STERILE FF (TOWEL DISPOSABLE) ×8 IMPLANT
TUBE CONNECTING 20'X1/4 (TUBING) ×1
TUBE CONNECTING 20X1/4 (TUBING) ×3 IMPLANT
WATER STERILE IRR 1000ML POUR (IV SOLUTION) ×4 IMPLANT
YANKAUER SUCT BULB TIP NO VENT (SUCTIONS) ×4 IMPLANT

## 2020-06-02 NOTE — Op Note (Signed)
OPERATIVE REPORT   06/02/2020  8:35 PM  PATIENT:  Tracey Armstrong   SURGEON:  Bertram Savin, MD  ASSISTANT:  Cherlynn June, PA-C.   PREOPERATIVE DIAGNOSIS: Comminuted displaced left distal third femoral shaft fracture.  POSTOPERATIVE DIAGNOSIS:  Same.  PROCEDURE: Retrograde intramedullary fixation of left femur. Interpretation of fluoroscopic images.  ANESTHESIA:   GETA.  ANTIBIOTICS: 2 g Ancef.  IMPLANTS: Biomet [place] retrograde femoral nail, size 10.5 x 360 mm. 5 mm distal interlocking screw x4. 5 mm proximal interlocking screw x1.  SPECIMENS: None.  COMPLICATIONS: None.  DISPOSITION: Stable to PACU.  SURGICAL INDICATIONS:  Tracey Armstrong is a 85 y.o. female with a diagnosis of comminuted displaced left distal third femoral shaft fracture.  She sustained a fracture due to a ground-level fall at her independent living facility.  She was admitted to [facility], and underwent perioperative medical risk ratification by the hospitalist service.  The risks, benefits, and alternatives were discussed with the patient preoperatively including but not limited to the risks of infection, bleeding, nerve / blood vessel injury, malunion, nonunion, cardiopulmonary complications, the need for repeat surgery, among others, and the patient was willing to proceed.  PROCEDURE IN DETAIL: Patient was identified in the holding area using 2 identifiers.  The surgical site was marked by myself.  She was taken to the operating room, and general anesthesia was induced on the stretcher.  She was then transferred to the operating room table and placed in the supine position.  All bony prominences were well-padded.  A bump was placed under the left hip.  The left lower extremity was prepped and draped in the normal sterile surgical fashion.  Timeout was called, verifying site and site of surgery.  She did receive IV antibiotics within 60 minutes of beginning the procedure.  I began by  using fluoroscopy to better evaluate the fracture.  There is a spiral fracture beginning at the distal third femoral shaft proceeding to the supracondylar region of the femur.  There is a large butterfly fragment.  The fracture was reduced with inline traction over a triangle.  Longitudinal incision was created over the distal pole of the patella.  Full-thickness skin flaps were raised.  A limited medial parapatellar arthrotomy was made, taking care to protect the underlying cartilage and medial meniscus.  Using an entry awl, the standard starting point for a retrograde femoral nail was obtained.  Guidepin was placed under AP and lateral fluoroscopy views.  Entry drill was used to create a hole into the medullary canal.  Using the reduction finger and fluoroscopy, I was able to enter the medullary canal of the proximal femur.  I then placed a guidewire through the reduction finger just proximal to the lesser trochanter.  Sequential reaming was performed up to a 12 mm reamer.  The guidewire was measured, and a 360 mm nail was appropriate.  The nail was opened and assembled on the jig on the back table.  The nail was easily impacted into the femur.  The guidewire was removed.  Using the distal targeting jig, I placed 2 transverse screws from lateral to medial, and 2 oblique screws through separate stab incisions using AO technique.  Screwdriver was used to lock the screws through the distal end of the nail.  The extremity was brought into extension, and peripheral circle technique was used to place a single proximal interlocking screw.  Final AP and lateral fluoroscopy views were obtained of the fracture site, proximal, and distal ends of  the nail.  Reduction was acceptable.  Length was adequately restored.  The wounds were copiously irrigated with normal saline.  Meticulous hemostasis was achieved with Bovie electrocautery.  1 g of vancomycin powder was placed into the arthrotomy.  The arthrotomy was closed with #1  Vicryl.  Wounds were closed with 2-0 Monocryl for the deep dermal layer and staples for the skin.  Dermabond was applied to the skin.  Once the glue was adequately dried, Mepilex dressings were placed.  The patient was then extubated and taken to the PACU in stable condition.  Sponge, needle, and instrument counts were correct at the end of the case x2.  POSTOPERATIVE PLAN: The patient will be readmitted to the hospitalist service.  Touchdown weightbearing left lower extremity.  She is able to have full range of motion to the left knee.  Mobilize out of bed with PT/OT.  Begin Lovenox while in-house for DVT prophylaxis.  She will undergo disposition planning.  Return to the office 2 weeks after discharge for suture removal and follow-up radiographs.

## 2020-06-02 NOTE — Consult Note (Addendum)
ORTHOPAEDIC CONSULTATION  REQUESTING PHYSICIAN: Collier Bullock, MD  PCP:  Martinique, [name], MD  Chief Complaint: Left femur injury  HPI: Tracey Armstrong is a 85 y.o. female who complains of left femur pain and inability to weight-bear after she sustained a mechanical ground-level fall at [facility] independent living facility.  She normally uses a Rollator.  She denies other injuries.  She has chronic low back pain from an L2 compression fracture.  Past Medical History:  Diagnosis Date  . Anxiety   . Arthritis   . Chronic insomnia 03/30/2015  . Chronic low back pain 12/02/2016  . Depression   . GERD (gastroesophageal reflux disease)   . Hiatal hernia   . High cholesterol   . Hypertelorism   . Hypertension   . Meningioma ([place])   . Migraine    Past Surgical History:  Procedure Laterality Date  . STOMACH SURGERY     Social History   Socioeconomic History  . Marital status: Widowed    Spouse name: Not on file  . Number of children: 4  . Years of education: 60  . Highest education level: Not on file  Occupational History  . Occupation: Retired  Tobacco Use  . Smoking status: Never Smoker  . Smokeless tobacco: Never Used  Vaping Use  . Vaping Use: Never used  Substance and Sexual Activity  . Alcohol use: No  . Drug use: No  . Sexual activity: Not Currently  Other Topics Concern  . Not on file  Social History Narrative   Lives in [place]      Patient drinks 1 glass of tea daily.      Patient is right handed.   Social Determinants of Health   Financial Resource Strain:   . Difficulty of Paying Living Expenses:   Food Insecurity:   . Worried About Charity fundraiser in the Last Year:   . Arboriculturist in the Last Year:   Transportation Needs:   . Film/video editor (Medical):   Marland Kitchen Lack of Transportation (Non-Medical):   Physical Activity:   . Days of Exercise per Week:   . Minutes of Exercise per Session:   Stress:   . Feeling of Stress :    Social Connections:   . Frequency of Communication with Friends and Family:   . Frequency of Social Gatherings with Friends and Family:   . Attends Religious Services:   . Active Member of Clubs or Organizations:   . Attends Archivist Meetings:   Marland Kitchen Marital Status:    Family History  Problem Relation Age of Onset  . Migraines Father   . Stroke Father   . Heart Problems Mother   . Epilepsy Brother    Allergies  Allergen Reactions  . Dihydrotachysterol Other (See Comments)    Unknown reaction  . Trazodone And Nefazodone Other (See Comments)    Worsens headaches  . Valium [Diazepam] Other (See Comments)    headaches  . Citalopram Other (See Comments)    Other reaction(s): Headache  . Other Other (See Comments)    "States all antidepressants cause migraines." - the only antidepressant tolerated is xanax   Prior to Admission medications   Medication Sig Start Date End Date Taking? Authorizing Provider  acetaminophen (TYLENOL) 650 MG CR tablet Take 975 mg by mouth daily.   Yes [provider]  ALPRAZolam Duanne Moron) 1 MG tablet Take 0.5 tablets (0.5 mg total) by mouth 2 (two) times daily  as needed for up to 10 doses for anxiety. Patient taking differently: Take 1 mg by mouth at bedtime.  04/13/20  Yes Pahwani, Einar Grad, MD  Cyanocobalamin (VITAMIN B-12 PO) Take 1 tablet by mouth daily with lunch.   Yes [provider]  diphenhydramine-acetaminophen (TYLENOL PM) 25-500 MG TABS tablet Take 1 tablet by mouth at bedtime.   Yes [provider]  gabapentin (NEURONTIN) 300 MG capsule TAKE 1 CAPSULE BY MOUTH AT BEDTIME Patient taking differently: Take 300 mg by mouth at bedtime.  02/15/20  Yes Martinique, [name], MD  Magnesium 400 MG TABS Take 400 mg by mouth at bedtime.   Yes [provider]  MAXALT-MLT 10 MG disintegrating tablet DISSOLVE ONE TABLET BY MOUTH AS NEEDED HEADACHE Patient taking differently: Take 10 mg by mouth daily as needed for migraine  (Headache.).  05/31/19  Yes Martinique, [name], MD  Melatonin 10 MG TABS Take 10 mg by mouth at bedtime.   Yes [provider]  omeprazole (PRILOSEC OTC) 20 MG tablet Take 20 mg by mouth daily before breakfast.   Yes [provider]  oxybutynin (DITROPAN XL) 10 MG 24 hr tablet Take 1 tablet (10 mg total) by mouth at bedtime. 12/19/19  Yes Martinique, [name], MD  POTASSIUM PO Take 1 tablet by mouth daily with lunch.   Yes [provider]  pregabalin (LYRICA) 50 MG capsule TAKE 1 CAPSULE(50 MG) BY MOUTH THREE TIMES DAILY Patient taking differently: Take 100 mg by mouth daily with lunch.  03/26/20  Yes Pete Pelt, PA-C  pyridOXINE (VITAMIN B-6) 100 MG tablet Take 400 mg by mouth daily with lunch.   Yes [provider]  riboflavin (VITAMIN B-2) 100 MG TABS tablet Take 400 mg by mouth daily with lunch.   Yes [provider]  tiZANidine (ZANAFLEX) 4 MG tablet Take 1 tablet (4 mg total) by mouth 2 (two) times daily as needed. Patient taking differently: Take 4 mg by mouth at bedtime.  05/22/20  Yes Martinique, [name], MD  tretinoin (RETIN-A) 0.1 % cream APPLY TO AFFECTED AREA EVERY DAY AT BEDTIME Patient taking differently: Apply 1 application topically at bedtime as needed (brown spots).  09/22/18  Yes Martinique, [name], MD  Turmeric 500 MG CAPS Take 500 mg by mouth daily with lunch.    Yes [provider]  verapamil (VERELAN PM) 180 MG 24 hr capsule TAKE 1 CAPSULE(180 MG) BY MOUTH DAILY Patient taking differently: Take 180 mg by mouth daily.  12/19/19  Yes Martinique, [name], MD  Vitamin D, Ergocalciferol, (DRISDOL) 1.25 MG (50000 UNIT) CAPS capsule 1 cap every 2 weeks. Patient taking differently: Take 50,000 Units by mouth See admin instructions. Take one capsule (50,000 units) by mouth every other Sunday night 12/19/19  Yes Martinique, [name], MD  lansoprazole (PREVACID) 15 MG capsule 1 tab at 7:00 am (30 min before breakfast). Patient not taking: Reported on 06/02/2020  06/16/17   Martinique, [name], MD  Magnesium 250 MG TABS Take 250 mg by mouth daily. 2 PO DAILY Patient not taking: Reported on 06/02/2020    [provider]  Riboflavin 400 MG CAPS Take 1 capsule (400 mg total) by mouth daily. Patient not taking: Reported on 06/02/2020 03/30/15   Kathrynn Ducking, MD   DG Chest 1 View  Result Date: 06/02/2020 CLINICAL DATA:  Fall. EXAM: CHEST  1 VIEW COMPARISON:  None. FINDINGS: Lungs are adequately inflated and otherwise clear. Cardiomediastinal silhouette is normal. Diffuse decreased  bone mineralization. No acute fracture. IMPRESSION: No acute findings. Electronically Signed   By: Marin Olp M.D.   On: 06/02/2020 13:52   DG Pelvis 1-2 Views  Result Date: 06/02/2020 CLINICAL DATA:  Left leg deformity post fall. EXAM: PELVIS - 1-2 VIEW COMPARISON:  07/28/2018 FINDINGS: Mild diffuse decreased bone mineralization. Minimal degenerate change of the spine and symphysis pubis joint. Subtle symmetric degenerative change of the hips. No acute fracture or dislocation. IMPRESSION: No acute findings. Electronically Signed   By: Marin Olp M.D.   On: 06/02/2020 13:55   CT HEAD WO CONTRAST  Result Date: 06/02/2020 CLINICAL DATA:  Head trauma post fall. EXAM: CT HEAD WITHOUT CONTRAST TECHNIQUE: Contiguous axial images were obtained from the base of the skull through the vertex without intravenous contrast. COMPARISON:  MRI brain 06/01/2019 FINDINGS: Brain: Ventricles, cisterns and other CSF spaces are within normal. There is mild chronic ischemic microvascular disease. There is no intra-axial mass, mass effect or shift of midline structures. No acute hemorrhage. Evidence of known small right parietal meningioma. No acute infarction. Vascular: No hyperdense vessel or unexpected calcification. Skull: Normal. Negative for fracture or focal lesion. Sinuses/Orbits: No acute finding. Other: None. IMPRESSION: 1. No acute findings. 2. Mild chronic ischemic microvascular disease.  3. Evidence of known small right parietal meningioma stable. Electronically Signed   By: Marin Olp M.D.   On: 06/02/2020 13:58   DG Femur Min 2 Views Left  Result Date: 06/02/2020 CLINICAL DATA:  Left hip pain post fall. EXAM: LEFT FEMUR 2 VIEWS COMPARISON:  04/04/2020 FINDINGS: Diffuse decreased bone mineralization. Examination demonstrates a displaced slightly comminuted oblique fracture of the distal femoral diaphysis with 1.5 shaft's with posterolateral displacement of the distal fragment. Mild degenerate change of the left knee. IMPRESSION: Displaced oblique fracture of the distal femoral diaphysis. Electronically Signed   By: Marin Olp M.D.   On: 06/02/2020 13:54    Positive ROS: All other systems have been reviewed and were otherwise negative with the exception of those mentioned in the HPI and as above.  Physical Exam: General: Alert, no acute distress Cardiovascular: No pedal edema Respiratory: No cyanosis, no use of accessory musculature GI: No organomegaly, abdomen is soft and non-tender Skin: No lesions in the area of chief complaint Neurologic: Sensation intact distally Psychiatric: Patient is competent for consent with normal mood and affect Lymphatic: No axillary or cervical lymphadenopathy  MUSCULOSKELETAL: Examination of the left lower extremity reveals that she is in a traction splint.  She has an obvious deformity to the mid/distal thigh.  No open wounds.  She has active dorsiflexion, plantarflexion, and great toe extension.  Foot is warm and well-perfused.  Sensation is intact to light touch in all distributions.  Assessment: Comminuted, displaced left distal femur fracture  Plan: I discussed the findings with the patient.  Her unstable injury requires surgical stabilization.  We discussed intramedullary fixation of her left femur.  We reviewed the risk, benefits, and alternatives.  Please see statement of risk.  Plan for surgery today.  All questions were  solicited and answered.  The risks, benefits, and alternatives were discussed with the patient. There are risks associated with the surgery including, but not limited to, problems with anesthesia (death), infection, differences in leg length/angulation/rotation, fracture of bones, loosening or failure of implants, malunion, nonunion, hematoma (blood accumulation) which may require surgical drainage, blood clots, pulmonary embolism, nerve injury (foot drop), and blood vessel injury. The patient understands these risks and elects to proceed.  Hilton Cork  [place], MD [phone]    06/02/2020 5:46 PM

## 2020-06-02 NOTE — H&P (Addendum)
History and Physical    Tracey Armstrong EKC:[id number] DOB: June 21, 1935 DOA: 06/02/2020  PCP: Martinique, [name], MD   Patient coming from: [facility]  I have personally briefly reviewed patient's old medical records in [place]  Chief Complaint: Status post fall  HPI: Tracey Armstrong is a 85 y.o. female with medical history significant for anxiety, depression, GERD, hypertension, hiatal hernia was brought to the ER for evaluation after a mechanical fall.  Patient slipped on a wooden floor and was unable to get up.  She landed on her left side and was on the ground for an hour before she could call for help.  Patient has a life alert but it was on her Rollator and she was unable to reach it since she was on the floor.  It took about an hour for her to drink herself towards the Rollator to pressed the life alert.   Her left leg was noted to be rotated and shortened and so patient was placed in traction by EMS and received 250 mcg of fentanyl as well as 4 mg of Zofran prior to coming to the ER. Patient denied feeling dizzy or lightheaded.  She denied having any loss of consciousness.  She denies having any chest pain, shortness of breath, nausea, vomiting, abdominal pain or any changes in her bowel habits. Chest x-ray reviewed by me did not show any acute findings. Left hip x-ray shows an oblique fracture of the distal femur. Labs were reviewed and are within normal limits Twelve-lead EKG reviewed by me shows sinus rhythm  ED Course: Patient is an 85 year old female who resides in an independent living facility.  She presents to the ER for evaluation after a fall.  Patient landed on her left side and has a left femur fracture.  Orthopedic consult has been placed.  She will be admitted to the hospital for further evaluation.  Review of Systems: As per HPI otherwise 10 point review of systems negative.    Past Medical History:  Diagnosis Date   Anxiety     Arthritis    Chronic insomnia 03/30/2015   Chronic low back pain 12/02/2016   Depression    GERD (gastroesophageal reflux disease)    Hiatal hernia    High cholesterol    Hypertelorism    Hypertension    Meningioma (HCC)    Migraine     Past Surgical History:  Procedure Laterality Date   STOMACH SURGERY       reports that she has never smoked. She has never used smokeless tobacco. She reports that she does not drink alcohol and does not use drugs.  Allergies  Allergen Reactions   Dihydrotachysterol     Other reaction(s): Other (See Comments)   Trazodone And Nefazodone Other (See Comments)    Worsens headaches   Valium [Diazepam] Other (See Comments)    headaches   Citalopram Other (See Comments)    Other reaction(s): Headache   Other Other (See Comments)    "States all antidepressants cause migraines."    Family History  Problem Relation Age of Onset   Migraines Father    Stroke Father    Heart Problems Mother    Epilepsy Brother      Prior to Admission medications   Medication Sig Start Date End Date Taking? Authorizing Provider  ALPRAZolam Duanne Moron) 1 MG tablet Take 0.5 tablets (0.5 mg total) by mouth 2 (two) times daily as needed for up to 10 doses for anxiety. 04/13/20  Darliss Cheney, MD  gabapentin (NEURONTIN) 300 MG capsule TAKE 1 CAPSULE BY MOUTH AT BEDTIME Patient taking differently: Take 300 mg by mouth at bedtime.  02/15/20   Martinique, [name], MD  lansoprazole (PREVACID) 15 MG capsule 1 tab at 7:00 am (30 min before breakfast). Patient taking differently: Take 15 mg by mouth daily before breakfast.  06/16/17   Martinique, [name], MD  Magnesium 250 MG TABS Take 250 mg by mouth daily. 2 PO DAILY    [provider]  MAXALT-MLT 10 MG disintegrating tablet DISSOLVE ONE TABLET BY MOUTH AS NEEDED HEADACHE Patient taking differently: Take 10 mg by mouth as needed for migraine (Headache.).  05/31/19   Martinique, [name], MD  oxybutynin (DITROPAN  XL) 10 MG 24 hr tablet Take 1 tablet (10 mg total) by mouth at bedtime. 12/19/19   Martinique, [name], MD  oxyCODONE (OXY IR/ROXICODONE) 5 MG immediate release tablet Take 1 tablet (5 mg total) by mouth every 4 (four) hours as needed for severe pain. 04/13/20   Darliss Cheney, MD  pregabalin (LYRICA) 50 MG capsule TAKE 1 CAPSULE(50 MG) BY MOUTH THREE TIMES DAILY Patient taking differently: Take 50 mg by mouth 3 (three) times daily.  03/26/20   Pete Pelt, PA-C  Riboflavin 400 MG CAPS Take 1 capsule (400 mg total) by mouth daily. 03/30/15   Kathrynn Ducking, MD  tiZANidine (ZANAFLEX) 4 MG tablet Take 1 tablet (4 mg total) by mouth 2 (two) times daily as needed. 05/22/20   Martinique, [name], MD  tretinoin (RETIN-A) 0.1 % cream APPLY TO AFFECTED AREA EVERY DAY AT BEDTIME Patient taking differently: Apply 1 application topically at bedtime.  09/22/18   Martinique, [name], MD  Turmeric 500 MG CAPS Take 1 capsule by mouth daily.    [provider]  verapamil (VERELAN PM) 180 MG 24 hr capsule TAKE 1 CAPSULE(180 MG) BY MOUTH DAILY Patient taking differently: Take 180 mg by mouth daily.  12/19/19   Martinique, [name], MD  Vitamin D, Ergocalciferol, (DRISDOL) 1.25 MG (50000 UNIT) CAPS capsule 1 cap every 2 weeks. Patient taking differently: Take 50,000 Units by mouth every 14 (fourteen) days.  12/19/19   Martinique, [name], MD    Physical Exam: Vitals:   06/02/20 1207 06/02/20 1350 06/02/20 1400 06/02/20 1430  BP:  (!) 150/67 (!) 153/69 139/65  Pulse:  80 (!) 102 73  Resp:  15 (!) 22 19  Temp:      TempSrc:      SpO2:  99% 97% 94%  Weight: 71.8 kg     Height: 5\' 2"  (1.575 m)        Vitals:   06/02/20 1207 06/02/20 1350 06/02/20 1400 06/02/20 1430  BP:  (!) 150/67 (!) 153/69 139/65  Pulse:  80 (!) 102 73  Resp:  15 (!) 22 19  Temp:      TempSrc:      SpO2:  99% 97% 94%  Weight: 71.8 kg     Height: 5\' 2"  (1.575 m)       Constitutional: NAD, alert and oriented x 3 Eyes: PERRL, lids and conjunctivae  normal ENMT: Mucous membranes are moist.  Neck: normal, supple, no masses, no thyromegaly Respiratory: clear to auscultation bilaterally, no wheezing, no crackles. Normal respiratory effort. No accessory muscle use.  Cardiovascular: Regular rate and rhythm, no murmurs / rubs / gallops. No extremity edema. 2+ pedal pulses. No carotid bruits.  Abdomen: no tenderness, no masses palpated. No  hepatosplenomegaly. Bowel sounds positive.  Musculoskeletal: no clubbing / cyanosis.  Left leg in immobilizer.  Skin: no rashes, lesions, ulcers.  Neurologic: Generalized weakness psychiatric: Normal mood and affect.   Labs on Admission: I have personally reviewed following labs and imaging studies  CBC: Recent Labs  Lab 06/02/20 1206  WBC 11.0*  NEUTROABS 9.3*  HGB 11.8*  HCT 36.8  MCV 94.8  PLT 629   Basic Metabolic Panel: Recent Labs  Lab 06/02/20 1206  NA 137  K 4.7  CL 107  CO2 23  GLUCOSE 126*  BUN 8  CREATININE 0.86  CALCIUM 8.7*   GFR: Estimated Creatinine Clearance: 46 mL/min (by C-G formula based on SCr of 0.86 mg/dL). Liver Function Tests: No results for input(s): AST, ALT, ALKPHOS, BILITOT, PROT, ALBUMIN in the last 168 hours. No results for input(s): LIPASE, AMYLASE in the last 168 hours. No results for input(s): AMMONIA in the last 168 hours. Coagulation Profile: Recent Labs  Lab 06/02/20 1206  INR 1.1   Cardiac Enzymes: No results for input(s): CKTOTAL, CKMB, CKMBINDEX, TROPONINI in the last 168 hours. BNP (last 3 results) No results for input(s): PROBNP in the last 8760 hours. HbA1C: No results for input(s): HGBA1C in the last 72 hours. CBG: No results for input(s): GLUCAP in the last 168 hours. Lipid Profile: No results for input(s): CHOL, HDL, LDLCALC, TRIG, CHOLHDL, LDLDIRECT in the last 72 hours. Thyroid Function Tests: No results for input(s): TSH, T4TOTAL, FREET4, T3FREE, THYROIDAB in the last 72 hours. Anemia Panel: No results for input(s):  VITAMINB12, FOLATE, FERRITIN, TIBC, IRON, RETICCTPCT in the last 72 hours. Urine analysis:    Component Value Date/Time   BILIRUBINUR neg 06/10/2017 1728   PROTEINUR neg 06/10/2017 1728   UROBILINOGEN 1.0 06/10/2017 1728   NITRITE neg 06/10/2017 1728   LEUKOCYTESUR Negative 06/10/2017 1728    Radiological Exams on Admission: DG Chest 1 View  Result Date: 06/02/2020 CLINICAL DATA:  Fall. EXAM: CHEST  1 VIEW COMPARISON:  None. FINDINGS: Lungs are adequately inflated and otherwise clear. Cardiomediastinal silhouette is normal. Diffuse decreased bone mineralization. No acute fracture. IMPRESSION: No acute findings. Electronically Signed   By: Marin Olp M.D.   On: 06/02/2020 13:52   DG Pelvis 1-2 Views  Result Date: 06/02/2020 CLINICAL DATA:  Left leg deformity post fall. EXAM: PELVIS - 1-2 VIEW COMPARISON:  07/28/2018 FINDINGS: Mild diffuse decreased bone mineralization. Minimal degenerate change of the spine and symphysis pubis joint. Subtle symmetric degenerative change of the hips. No acute fracture or dislocation. IMPRESSION: No acute findings. Electronically Signed   By: Marin Olp M.D.   On: 06/02/2020 13:55   CT HEAD WO CONTRAST  Result Date: 06/02/2020 CLINICAL DATA:  Head trauma post fall. EXAM: CT HEAD WITHOUT CONTRAST TECHNIQUE: Contiguous axial images were obtained from the base of the skull through the vertex without intravenous contrast. COMPARISON:  MRI brain 06/01/2019 FINDINGS: Brain: Ventricles, cisterns and other CSF spaces are within normal. There is mild chronic ischemic microvascular disease. There is no intra-axial mass, mass effect or shift of midline structures. No acute hemorrhage. Evidence of known small right parietal meningioma. No acute infarction. Vascular: No hyperdense vessel or unexpected calcification. Skull: Normal. Negative for fracture or focal lesion. Sinuses/Orbits: No acute finding. Other: None. IMPRESSION: 1. No acute findings. 2. Mild chronic  ischemic microvascular disease. 3. Evidence of known small right parietal meningioma stable. Electronically Signed   By: Marin Olp M.D.   On: 06/02/2020 13:58   DG Femur Min  2 Views Left  Result Date: 06/02/2020 CLINICAL DATA:  Left hip pain post fall. EXAM: LEFT FEMUR 2 VIEWS COMPARISON:  04/04/2020 FINDINGS: Diffuse decreased bone mineralization. Examination demonstrates a displaced slightly comminuted oblique fracture of the distal femoral diaphysis with 1.5 shaft's with posterolateral displacement of the distal fragment. Mild degenerate change of the left knee. IMPRESSION: Displaced oblique fracture of the distal femoral diaphysis. Electronically Signed   By: Marin Olp M.D.   On: 06/02/2020 13:54    EKG: Independently reviewed.  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  CODE STATUS was discussed and she wants to be placed in a DO NOT RESUSCITATE status.  She lists her son Tailyn Hantz as her healthcare power of attorney Disposition Plan: Back to previous home environment Consults called: Orthopedic surgery    Emmett Bracknell MD [facility]     06/02/2020, 2:52 PM

## 2020-06-02 NOTE — Progress Notes (Signed)
Pt refusing to ice surgical site and float heels at this time

## 2020-06-02 NOTE — Transfer of Care (Signed)
Immediate Anesthesia Transfer of Care Note  Patient: Tracey Armstrong  Procedure(s) Performed: INTRAMEDULLARY (IM) RETROGRADE FEMORAL NAILING - LEFT (Left Hip)  Patient Location: PACU  Anesthesia Type:General  Level of Consciousness: awake, alert  and oriented  Airway & Oxygen Therapy: Patient Spontanous Breathing and Patient connected to nasal cannula oxygen  Post-op Assessment: Report given to RN, Post -op Vital signs reviewed and stable and Patient moving all extremities  Post vital signs: Reviewed and stable  Last Vitals:  Vitals Value Taken Time  BP    Temp    Pulse    Resp    SpO2      Last Pain:  Vitals:   06/02/20 1657  TempSrc: Oral  PainSc:          Complications: No complications documented.

## 2020-06-02 NOTE — Discharge Instructions (Signed)
Dr. Rod Can Adult Hip & Knee Specialist [facility] [address] [place], [place] [address] [phone]   POSTOPERATIVE DIRECTIONS    Rehabilitation, Guidelines Following Surgery   WEIGHT BEARING Other:  Touch down weight bearing left lower extremity   HOME CARE INSTRUCTIONS  Remove items at home which could result in a fall. This includes throw rugs or furniture in walking pathways.  Continue medications as instructed at time of discharge.  You may have some home medications which will be placed on hold until you complete the course of blood thinner medication.  4 days after discharge, you may start showering. No tub baths or soaking your incisions. Do not put on socks or shoes without following the instructions of your caregivers.   Sit on chairs with arms. Use the chair arms to help push yourself up when arising.  Arrange for the use of a toilet seat elevator so you are not sitting low.   Walk with walker as instructed.  You may resume a sexual relationship in one month or when given the OK by your caregiver.  Use walker as long as suggested by your caregivers.  Avoid periods of inactivity such as sitting longer than an hour when not asleep. This helps prevent blood clots.  You may return to work once you are cleared by [employer], production.  Do not drive a car for 6 weeks or until released by your surgeon.  Do not drive while taking narcotics.  Wear elastic stockings for two weeks following surgery during the day but you may remove then at night.  Make sure you keep all of your appointments after your operation with all of your doctors and caregivers. You should call the office at the above phone number and make an appointment for approximately two weeks after the date of your surgery. Please pick up a stool softener and laxative for home use as long as you are requiring pain medications.  ICE to the affected hip every three hours for 30  minutes at a time and then as needed for pain and swelling. Continue to use ice on the hip for pain and swelling from surgery. You may notice swelling that will progress down to the foot and ankle.  This is normal after surgery.  Elevate the leg when you are not up walking on it.   It is important for you to complete the blood thinner medication as prescribed by your doctor.  Continue to use the breathing machine which will help keep your temperature down.  It is common for your temperature to cycle up and down following surgery, especially at night when you are not up moving around and exerting yourself.  The breathing machine keeps your lungs expanded and your temperature down.  RANGE OF MOTION AND STRENGTHENING EXERCISES  These exercises are designed to help you keep full movement of your hip joint. Follow your caregiver's or physical therapist's instructions. Perform all exercises about fifteen times, three times per day or as directed. Exercise both hips, even if you have had only one joint replacement. These exercises can be done on a training (exercise) mat, on the floor, on a table or on a bed. Use whatever works the best and is most comfortable for you. Use music or television while you are exercising so that the exercises are a pleasant break in your day. This will make your life better with the exercises acting as a break in routine you can look forward to.  Lying on your back, slowly slide your foot toward your buttocks, raising your knee up off the floor. Then slowly slide your foot back down until your leg is straight again.  Lying on your back spread your legs as far apart as you can without causing discomfort.  Lying on your side, raise your upper leg and foot straight up from the floor as far as is comfortable. Slowly lower the leg and repeat.  Lying on your back, tighten up the muscle in the front of your thigh (quadriceps muscles). You can do this by keeping your leg straight and trying  to raise your heel off the floor. This helps strengthen the largest muscle supporting your knee.  Lying on your back, tighten up the muscles of your buttocks both with the legs straight and with the knee bent at a comfortable angle while keeping your heel on the floor.   SKILLED REHAB INSTRUCTIONS: If the patient is transferred to a skilled rehab facility following release from the hospital, a list of the current medications will be sent to the facility for the patient to continue.  When discharged from the skilled rehab facility, please have the facility set up the patient's [place] prior to being released. Also, the skilled facility will be responsible for providing the patient with their medications at time of release from the facility to include their pain medication and their blood thinner medication. If the patient is still at the rehab facility at time of the two week follow up appointment, the skilled rehab facility will also need to assist the patient in arranging follow up appointment in our office and any transportation needs.  MAKE SURE YOU:  Understand these instructions.  Will watch your condition.  Will get help right away if you are not doing well or get worse.  Pick up stool softner and laxative for home use following surgery while on pain medications. Daily dry dressing changes as needed. In 4 days, you may remove your dressings and begin taking showers - no tub baths or soaking the incisions. Continue to use ice for pain and swelling after surgery. Do not use any lotions or creams on the incision until instructed by your surgeon.

## 2020-06-02 NOTE — ED Notes (Signed)
Requesting pain medication. RN notified.

## 2020-06-02 NOTE — ED Provider Notes (Signed)
[place] EMERGENCY DEPARTMENT Provider Note   CSN: [id number] Arrival date & time: 06/02/20  1143     History Chief Complaint  Patient presents with  . Fall    Shanisha Lech is a 85 y.o. female.   Fall This is a new problem. The current episode started 3 to 5 hours ago. The problem occurs rarely. The problem has not changed since onset.Pertinent negatives include no chest pain, no headaches and no shortness of breath. Associated symptoms comments: Left leg pain . Exacerbated by: movement. Relieved by: traction and pain medication. Treatments tried: immobilization. The treatment provided mild relief.       Past Medical History:  Diagnosis Date  . Anxiety   . Arthritis   . Chronic insomnia 03/30/2015  . Chronic low back pain 12/02/2016  . Depression   . GERD (gastroesophageal reflux disease)   . Hiatal hernia   . High cholesterol   . Hypertelorism   . Hypertension   . Meningioma ([place])   . Migraine     Patient Active Problem List   Diagnosis Date Noted  . 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 Synovial cyst of lumbar facet joint 04/05/2020  . Bilateral stenosis of lateral recess of lumbar spine 04/05/2020  . Dehydration 04/05/2020  . Closed wedge compression fracture of L1 vertebra ([place]) 04/04/2020  . Ambulatory dysfunction 04/04/2020  . Intractable low back pain 04/04/2020  . L1 vertebral fracture ([place]) 04/04/2020  . Urine, incontinence, stress female 02/07/2019  . Knee osteoarthritis 02/01/2018  . Iron deficiency anemia 10/28/2017  . Rhinitis, allergic 07/27/2017  . GERD (gastroesophageal reflux disease) 06/16/2017  . Chronic pain disorder 06/16/2017  . Vitamin D deficiency 05/25/2017  . Hyperlipidemia 05/25/2017  . 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 Pain 02/26/2017  . Chronic low back pain 12/02/2016  . Abdominal pain 03/26/2016  . 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 Peptic esophagitis 03/26/2016  . Neuralgia neuritis,  sciatic nerve 03/26/2016  . 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[place]) 06/20/2015  . Chronic insomnia 03/30/2015  . Chronic migraine 01/10/2015  . Depression 04/20/2014  . Generalized anxiety disorder 04/20/2014  . Chronic back pain 04/20/2014  . Chronic daily headache 04/20/2014  . Essential hypertension 04/20/2014  . Migraine headache 04/19/2014    Past Surgical History:  Procedure Laterality Date  . STOMACH SURGERY       OB History   No obstetric history on file.     Family History  Problem Relation Age of Onset  . Migraines Father   . Stroke Father   . Heart Problems Mother   . Epilepsy Brother     Social History   Tobacco Use  . Smoking status: Never Smoker  . Smokeless tobacco: Never Used  Vaping Use  . Vaping Use: Never used  Substance Use Topics  . Alcohol use: No  . Drug use: No    Home Medications Prior to Admission medications   Medication Sig Start Date End Date Taking? Authorizing Provider  ALPRAZolam Duanne Moron) 1 MG tablet Take 0.5 tablets (0.5 mg total) by mouth 2 (two) times daily as needed for up to 10 doses for anxiety. 04/13/20   Darliss Cheney, MD  gabapentin (NEURONTIN) 300 MG capsule TAKE 1 CAPSULE BY MOUTH AT BEDTIME Patient taking differently: Take 300 mg by mouth at bedtime.  02/15/20   [place], [name], MD  lansoprazole (PREVACID) 15 MG capsule 1 tab at 7:00 am (30 min before breakfast). Patient taking differently: Take 15 mg by  mouth daily before breakfast.  06/16/17   [place], [name], MD  Magnesium 250 MG TABS Take 250 mg by mouth daily. 2 PO DAILY    [provider]  MAXALT-MLT 10 MG disintegrating tablet DISSOLVE ONE TABLET BY MOUTH AS NEEDED HEADACHE Patient taking differently: Take 10 mg by mouth as needed for migraine (Headache.).  05/31/19   Martin[place], [name], MD  oxybutynin (DITROPAN XL) 10 MG 24 hr tablet Take 1 tablet (10 mg total) by mouth at bedtime. 12/19/19   [place], [name], MD  oxyCODONE (OXY IR/ROXICODONE) 5 MG immediate release  tablet Take 1 tablet (5 mg total) by mouth every 4 (four) hours as needed for severe pain. 04/13/20   Darliss Cheney, MD  pregabalin (LYRICA) 50 MG capsule TAKE 1 CAPSULE(50 MG) BY MOUTH THREE TIMES DAILY Patient taking differently: Take 50 mg by mouth 3 (three) times daily.  03/26/20   Pete Pelt, PA-C  Riboflavin 400 MG CAPS Take 1 capsule (400 mg total) by mouth daily. 03/30/15   Kathrynn Ducking, MD  tiZANidine (ZANAFLEX) 4 MG tablet Take 1 tablet (4 mg total) by mouth 2 (two) times daily as needed. 05/22/20   [place], [name], MD  tretinoin (RETIN-A) 0.1 % cream APPLY TO AFFECTED AREA EVERY DAY AT BEDTIME Patient taking differently: Apply 1 application topically at bedtime.  09/22/18   [place], [name], MD  Turmeric 500 MG CAPS Take 1 capsule by mouth daily.    [provider]  verapamil (VERELAN PM) 180 MG 24 hr capsule TAKE 1 CAPSULE(180 MG) BY MOUTH DAILY Patient taking differently: Take 180 mg by mouth daily.  12/19/19   [place], [name], MD  Vitamin D, Ergocalciferol, (DRISDOL) 1.25 MG (50000 UNIT) CAPS capsule 1 cap every 2 weeks. Patient taking differently: Take 50,000 Units by mouth every 14 (fourteen) days.  12/19/19   [place], [name], MD    Allergies    Dihydrotachysterol, Trazodone and nefazodone, Valium [diazepam], Citalopram, and Other  Review of Systems   Review of Systems  Constitutional: Negative for chills and fever.  HENT: Negative for congestion and rhinorrhea.   Respiratory: Negative for cough and shortness of breath.   Cardiovascular: Negative for chest pain and palpitations.  Gastrointestinal: Negative for diarrhea, nausea and vomiting.  Genitourinary: Negative for difficulty urinating and dysuria.  Musculoskeletal: Positive for arthralgias. Negative for back pain, neck pain and neck stiffness.  Skin: Negative for rash and wound.  Neurological: Negative for light-headedness and headaches.    Physical Exam Updated Vital Signs BP (!) 150/67   Pulse 80    Temp 98.7 F (37.1 C) (Oral)   Resp 15   Ht 5\' 2"  (1.575 m)   Wt 71.8 kg   SpO2 99%   BMI 28.95 kg/m   Physical Exam Vitals and nursing note reviewed. Exam conducted with a chaperone present.  Constitutional:      General: She is not in acute distress.    Appearance: Normal appearance.  HENT:     Head: Normocephalic and atraumatic.     Nose: No rhinorrhea.  Eyes:     General:        Right eye: No discharge.        Left eye: No discharge.     Conjunctiva/sclera: Conjunctivae normal.  Cardiovascular:     Rate and Rhythm: Normal rate and regular rhythm.  Pulmonary:     Effort: Pulmonary effort is normal. No respiratory distress.     Breath sounds: No  stridor.  Abdominal:     General: Abdomen is flat. There is no distension.     Palpations: Abdomen is soft.  Musculoskeletal:        General: Tenderness and signs of injury present.     Cervical back: Normal range of motion and neck supple. No rigidity or tenderness.     Comments: Tenderness to palpation of the midshaft femur, no tenderness at the joint line of the knee or hip.  Palpable pulse motor function and sensation intact  Skin:    General: Skin is warm and dry.  Neurological:     General: No focal deficit present.     Mental Status: She is alert. Mental status is at baseline.     Motor: No weakness.  Psychiatric:        Mood and Affect: Mood normal.        Behavior: Behavior normal.     ED Results / Procedures / Treatments   Labs (all labs ordered are listed, but only abnormal results are displayed) Labs Reviewed  BASIC METABOLIC PANEL - Abnormal; Notable for the following components:      Result Value   Glucose, Bld 126 (*)    Calcium 8.7 (*)    All other components within normal limits  CBC WITH DIFFERENTIAL/PLATELET - Abnormal; Notable for the following components:   WBC 11.0 (*)    Hemoglobin 11.8 (*)    Neutro Abs 9.3 (*)    All other components within normal limits  SARS CORONAVIRUS 2 BY RT PCR  (HOSPITAL ORDER, [facility])  PROTIME-INR  TYPE AND SCREEN    EKG EKG Interpretation  Date/Time:  Saturday June 02 2020 11:51:22 EDT Ventricular Rate:  75 PR Interval:    QRS Duration: 94 QT Interval:  397 QTC Calculation: 444 R Axis:   72 Text Interpretation: Sinus rhythm Consider left atrial enlargement Abnormal R-wave progression, early transition Confirmed by Dewaine Conger [phone]) on 06/02/2020 12:05:28 PM   Radiology DG Chest 1 View  Result Date: 06/02/2020 CLINICAL DATA:  Fall. EXAM: CHEST  1 VIEW COMPARISON:  None. FINDINGS: Lungs are adequately inflated and otherwise clear. Cardiomediastinal silhouette is normal. Diffuse decreased bone mineralization. No acute fracture. IMPRESSION: No acute findings. Electronically Signed   By: Marin Olp M.D.   On: 06/02/2020 13:52   DG Pelvis 1-2 Views  Result Date: 06/02/2020 CLINICAL DATA:  Left leg deformity post fall. EXAM: PELVIS - 1-2 VIEW COMPARISON:  07/28/2018 FINDINGS: Mild diffuse decreased bone mineralization. Minimal degenerate change of the spine and symphysis pubis joint. Subtle symmetric degenerative change of the hips. No acute fracture or dislocation. IMPRESSION: No acute findings. Electronically Signed   By: Marin Olp M.D.   On: 06/02/2020 13:55   CT HEAD WO CONTRAST  Result Date: 06/02/2020 CLINICAL DATA:  Head trauma post fall. EXAM: CT HEAD WITHOUT CONTRAST TECHNIQUE: Contiguous axial images were obtained from the base of the skull through the vertex without intravenous contrast. COMPARISON:  MRI brain 06/01/2019 FINDINGS: Brain: Ventricles, cisterns and other CSF spaces are within normal. There is mild chronic ischemic microvascular disease. There is no intra-axial mass, mass effect or shift of midline structures. No acute hemorrhage. Evidence of known small right parietal meningioma. No acute infarction. Vascular: No hyperdense vessel or unexpected calcification. Skull: Normal. Negative  for fracture or focal lesion. Sinuses/Orbits: No acute finding. Other: None. IMPRESSION: 1. No acute findings. 2. Mild chronic ischemic microvascular disease. 3. Evidence of known small right parietal  meningioma stable. Electronically Signed   By: Marin Olp M.D.   On: 06/02/2020 13:58   DG Femur Min 2 Views Left  Result Date: 06/02/2020 CLINICAL DATA:  Left hip pain post fall. EXAM: LEFT FEMUR 2 VIEWS COMPARISON:  04/04/2020 FINDINGS: Diffuse decreased bone mineralization. Examination demonstrates a displaced slightly comminuted oblique fracture of the distal femoral diaphysis with 1.5 shaft's with posterolateral displacement of the distal fragment. Mild degenerate change of the left knee. IMPRESSION: Displaced oblique fracture of the distal femoral diaphysis. Electronically Signed   By: Marin Olp M.D.   On: 06/02/2020 13:54    Procedures Procedures (including critical care time)  Medications Ordered in ED Medications  fentaNYL (SUBLIMAZE) injection 50 mcg (50 mcg Intravenous Given 06/02/20 1158)  HYDROmorphone (DILAUDID) injection 1 mg (1 mg Intravenous Given 06/02/20 1355)    ED Course  I have reviewed the triage vital signs and the nursing notes.  Pertinent labs & imaging results that were available during my care of the patient were reviewed by me and considered in my medical decision making (see chart for details).    MDM Rules/Calculators/A&P                          Arrived via EMS after fall with left femur deformity.  In traction.  Pain control be given.  Vital signs stable afebrile.  Femur deformity reported in traction now, MVI.  I will get plain films.  Will give pain medicine.  EKG performed shows sinus rhythm without acute ischemic change interval abnormality or arrhythmia.  C-collar was in place by EMS however she has no complaints of neck pain.  She does not seem distracted by the injury to her lower extremity, she is able answer questions appropriately she has no neck  midline tenderness full range of motion normal mental status.  Due to her age and a fall we will make sure there is no damage to bridging veins or subdural hematoma with CT scan of the head.  She will get basic labs and Covid test as well as a chest x-ray as she may need surgical intervention.  Patient agrees to this plan.  Oblique fracture of the distal femur, no other injury found reported.  Medical screening labs are unremarkable.  Covid test is negative.  Chest x-ray is unremarkable.  I spoke to orthopedics and medicine they both agree to admit to help this patient.  Additional pain control was given. Final Clinical Impression(s) / ED Diagnoses Final diagnoses:  Closed displaced oblique fracture of shaft of left femur, initial encounter [facility])    Rx / [place] Orders ED Discharge Orders    None       Breck Coons, MD 06/02/20 1415

## 2020-06-02 NOTE — ED Triage Notes (Addendum)
Pt BIB [facility]. Pt slipped on wood floor from standing. Pt was laying on the ground for 1h before she could call staff for help. L leg has outward rotation, EMS reported L leg 2 in shorter than the left, put pt in traction and now they are almost even. Pt feeling in L foot intact, pulses strong. EMS gave 244mcg fent over last hour and 4mg  zofran.

## 2020-06-02 NOTE — ED Notes (Signed)
Help get patient undress into a gown on the monitor did ekg shown to Dr Ron Parker patient is resting with call bell in reach

## 2020-06-02 NOTE — Anesthesia Procedure Notes (Signed)
Procedure Name: Intubation Date/Time: 06/02/2020 6:19 PM Performed by: Suzy Bouchard, CRNA Pre-anesthesia Checklist: Patient identified, Emergency Drugs available, Suction available and Patient being monitored Patient Re-evaluated:Patient Re-evaluated prior to induction Oxygen Delivery Method: Circle system utilized Preoxygenation: Pre-oxygenation with 100% oxygen Induction Type: IV induction Ventilation: Mask ventilation without difficulty Laryngoscope Size: Mac and 3 Grade View: Grade I Tube type: Oral Tube size: 7.0 mm Number of attempts: 1 Airway Equipment and Method: Stylet and Oral airway Placement Confirmation: ETT inserted through vocal cords under direct vision,  positive ETCO2 and breath sounds checked- equal and bilateral Secured at: 20 cm Tube secured with: Tape Dental Injury: Teeth and Oropharynx as per pre-operative assessment

## 2020-06-02 NOTE — Anesthesia Preprocedure Evaluation (Addendum)
Anesthesia Evaluation  Patient identified by MRN, date of birth, ID band Patient awake    Reviewed: Allergy & Precautions, NPO status , Patient's Chart, lab work & pertinent test results  History of Anesthesia Complications Negative for: history of anesthetic complications  Airway Mallampati: II  TM Distance: >3 FB Neck ROM: Full    Dental  (+) Teeth Intact, Dental Advisory Given   Pulmonary neg pulmonary ROS, neg recent URI,    breath sounds clear to auscultation       Cardiovascular hypertension, Pt. on medications (-) angina(-) Past MI and (-) CHF  Rhythm:Regular     Neuro/Psych  Headaches, PSYCHIATRIC DISORDERS Anxiety Depression  Neuromuscular disease    GI/Hepatic Neg liver ROS, hiatal hernia, GERD  Medicated and Controlled,  Endo/Other  negative endocrine ROS  Renal/GU negative Renal ROS     Musculoskeletal  (+) Arthritis , LEFT FEMUR FRACTURE   Abdominal   Peds  Hematology  (+) Blood dyscrasia, anemia ,   Anesthesia Other Findings   Reproductive/Obstetrics                            Anesthesia Physical Anesthesia Plan  ASA: II  Anesthesia Plan: General   Post-op Pain Management:    Induction: Intravenous  PONV Risk Score and Plan: 3 and Ondansetron and Dexamethasone  Airway Management Planned: Oral ETT  Additional Equipment: None  Intra-op Plan:   Post-operative Plan: Extubation in OR  Informed Consent: I have reviewed the patients History and Physical, chart, labs and discussed the procedure including the risks, benefits and alternatives for the proposed anesthesia with the patient or authorized representative who has indicated his/her understanding and acceptance.     Dental advisory given  Plan Discussed with: CRNA and Surgeon  Anesthesia Plan Comments:         Anesthesia Quick Evaluation

## 2020-06-03 ENCOUNTER — Inpatient Hospital Stay (HOSPITAL_COMMUNITY): Payer: Medicare Other

## 2020-06-03 ENCOUNTER — Encounter (HOSPITAL_COMMUNITY): Payer: Self-pay | Admitting: Internal Medicine

## 2020-06-03 LAB — BASIC METABOLIC PANEL
Anion gap: 10 (ref 5–15)
BUN: 8 mg/dL (ref 8–23)
CO2: 22 mmol/L (ref 22–32)
Calcium: 8.3 mg/dL — ABNORMAL LOW (ref 8.9–10.3)
Chloride: 105 mmol/L (ref 98–111)
Creatinine, Ser: 1.15 mg/dL — ABNORMAL HIGH (ref 0.44–1.00)
GFR calc Af Amer: 51 mL/min — ABNORMAL LOW (ref >60)
GFR calc non Af Amer: 44 mL/min — ABNORMAL LOW (ref >60)
Glucose, Bld: 170 mg/dL — ABNORMAL HIGH (ref 70–99)
Potassium: 4.4 mmol/L (ref 3.5–5.1)
Sodium: 137 mmol/L (ref 135–145)

## 2020-06-03 LAB — CBC
HCT: 25.8 % — ABNORMAL LOW (ref 36.0–46.0)
Hemoglobin: 8.3 g/dL — ABNORMAL LOW (ref 12.0–15.0)
MCH: 30.1 pg (ref 26.0–34.0)
MCHC: 32.2 g/dL (ref 30.0–36.0)
MCV: 93.5 fL (ref 80.0–100.0)
Platelets: 230 10*3/uL (ref 150–400)
RBC: 2.76 MIL/uL — ABNORMAL LOW (ref 3.87–5.11)
RDW: 14 % (ref 11.5–15.5)
WBC: 8 10*3/uL (ref 4.0–10.5)
nRBC: 0 % (ref 0.0–0.2)

## 2020-06-03 MED ORDER — HYDROMORPHONE HCL 1 MG/ML IJ SOLN
1.0000 mg | Freq: Once | INTRAMUSCULAR | Status: AC
  Administered 2020-06-03: 1 mg via INTRAVENOUS
  Filled 2020-06-03: qty 1

## 2020-06-03 MED ORDER — MELATONIN 3 MG PO TABS
9.0000 mg | ORAL_TABLET | Freq: Once | ORAL | Status: DC
  Filled 2020-06-03: qty 3

## 2020-06-03 MED ORDER — MORPHINE SULFATE (PF) 2 MG/ML IV SOLN
0.5000 mg | INTRAVENOUS | Status: DC | PRN
  Administered 2020-06-03 – 2020-06-06 (×13): 1 mg via INTRAVENOUS
  Filled 2020-06-03 (×14): qty 1

## 2020-06-03 MED ORDER — MELATONIN 3 MG PO TABS
9.0000 mg | ORAL_TABLET | Freq: Once | ORAL | Status: AC
  Administered 2020-06-03: 9 mg via ORAL
  Filled 2020-06-03: qty 3

## 2020-06-03 NOTE — Progress Notes (Signed)
Subjective: 1 Day Post-Op Procedure(s) (LRB): INTRAMEDULLARY (IM) RETROGRADE FEMORAL NAILING - LEFT (Left)  Patient reports pain as moderate to severe.  States that she's struggling with pain from her hip and low back fracture.  Denies fever, chills, N/V, CP, SOB.  Reports that she is ready to eat.  Admits to flatus.  Objective:   VITALS:  Temp:  [97.4 F (36.3 C)-98.7 F (37.1 C)] 97.4 F (36.3 C) (08/15 0405) Pulse Rate:  [72-102] 73 (08/15 0405) Resp:  [10-23] 15 (08/15 0405) BP: (105-189)/(48-93) 105/48 (08/15 0405) SpO2:  [93 %-100 %] 97 % (08/15 0405) FiO2 (%):  [21 %] 21 % (08/14 2300) Weight:  [71.8 kg] 71.8 kg (08/14 1207)  General: WDWN patient in NAD. Psych:  Appropriate mood and affect. Neuro:  A&O x 3, Moving all extremities, sensation intact to light touch HEENT:  EOMs intact Chest:  Even non-labored respirations Skin: Dressing C/D/I, no rashes or lesions Extremities: warm/dry, mild edema to L hip, no erythema or echymosis.  No lymphadenopathy. Pulses: Popliteus 2+ MSK:  ROM: TKE, MMT: able to perform quad set, (-) Homan's    LABS Recent Labs    06/02/20 1206 06/03/20 0142  HGB 11.8* 8.3*  WBC 11.0* 8.0  PLT 269 230   Recent Labs    06/02/20 1206 06/03/20 0142  NA 137 137  K 4.7 4.4  CL 107 105  CO2 23 22  BUN 8 8  CREATININE 0.86 1.15*  GLUCOSE 126* 170*   Recent Labs    06/02/20 1206  INR 1.1     Assessment/Plan: 1 Day Post-Op Procedure(s) (LRB): INTRAMEDULLARY (IM) RETROGRADE FEMORAL NAILING - LEFT (Left)  Patient seen in rounds for Dr. Lyla Glassing. TDWB L LE Up with therapy DVT ppx: lovenox Reordered 1 time dose for dilaudid 1 mg. Disp: pending Plan for outpatient post-op visit with Dr. Lyla Glassing.  Mechele Claude PA-C [facility] Office:  [phone]

## 2020-06-03 NOTE — Anesthesia Postprocedure Evaluation (Signed)
Anesthesia Post Note  Patient: Tracey Armstrong  Procedure(s) Performed: INTRAMEDULLARY (IM) RETROGRADE FEMORAL NAILING - LEFT (Left Hip)     Patient location during evaluation: PACU Anesthesia Type: General Level of consciousness: patient cooperative and awake Pain management: pain level controlled Vital Signs Assessment: post-procedure vital signs reviewed and stable Respiratory status: spontaneous breathing, nonlabored ventilation, respiratory function stable and patient connected to nasal cannula oxygen Cardiovascular status: blood pressure returned to baseline and stable Postop Assessment: no apparent nausea or vomiting Anesthetic complications: no   No complications documented.  Last Vitals:  Vitals:   06/02/20 2300 06/03/20 0405  BP:  (!) 105/48  Pulse:  73  Resp:  15  Temp:  (!) 36.3 C  SpO2: 93% 97%    Last Pain:  Vitals:   06/03/20 0405  TempSrc: Oral  PainSc:                  Tai Skelly

## 2020-06-03 NOTE — Progress Notes (Signed)
PT Cancellation Note  Patient Details Name: Tracey Armstrong MRN: [id number] DOB: 10/07/1935   Cancelled Treatment:    Reason Eval/Treat Not Completed: Pain limiting ability to participate   Declining PT/mobility despite our best efforts to educate re: benefits of mobility;   Will reattempt;   Pt did mention wearing a TLSO for management of a compression fracture in June; She was unable to recall if she still needs to wear the brace when getting up, or if the brace has been dc'd by Neurosurgery; She tells me Dr. Sherley Bounds is managing her vertebral fx;   Roney Marion, [place] Pager [phone] Office [phone]    Colletta [name] 06/03/2020, 12:52 PM

## 2020-06-03 NOTE — Progress Notes (Addendum)
PROGRESS NOTE    Tracey Armstrong  WJX:[id number] DOB: October 14, 1935 DOA: 06/02/2020 PCP: Martinique, [name], MD     Brief Narrative:  Patient is an 85 year old female with a history of anxiety/depression, reflux, hypertension who was admitted on 8/14 after sustaining a fall on a wood floor.  X-ray showed an oblique fracture of the distal femur.  She was evaluated by the orthopedic surgery team recommended admission and fixation.   New events last 24 hours / Subjective: Pt is in pain, reports controlled w meds. Taken to OR yesterday.   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  PT  Reglan prn nausea  Active Problems:   Essential hypertension  Hydralazine 10 mg q 6 hrs prn  Calan SR 180 mg/d     GERD  Protonix 40 mg/d     Vit D def  Vit D 50k u q 14 d    OAB    Ditropan XL 10 mg qhs     Hx of compression fx  Given fall and continued pain, will ck XR  DVT prophylaxis:  enoxaparin (LOVENOX) injection 40 mg Start: 06/03/20 1000 SCDs Start: 06/02/20 2300 Place TED hose Start: 06/02/20 2300  Code Status: Full Family Communication: Self Disposition Plan:   Status is: Inpatient  Remains inpatient appropriate because:Ongoing active pain requiring inpatient pain management   Dispo: The patient is from: Independent living facility, [facility]              Anticipated d/c is to: TBD by PT              Anticipated d/c date is: 2 days              Patient currently is not medically stable to d/c.   Consultants:   Ortho  Procedures:   Intramed retrograde femoral nailing, Left; 06/02/2020  Antimicrobials:  Anti-infectives (From admission, onward)   Start     Dose/Rate Route Frequency Ordered Stop   06/03/20 0600  ceFAZolin (ANCEF) IVPB 2g/100 mL premix        2 g 200 mL/hr over 30 Minutes  Intravenous On call to O.R. 06/02/20 1741 06/02/20 1856   06/02/20 2359  ceFAZolin (ANCEF) IVPB 2g/100 mL premix        2 g 200 mL/hr over 30 Minutes Intravenous Every 6 hours 06/02/20 2259 06/03/20 0649   06/02/20 2012  vancomycin (VANCOCIN) powder  Status:  Discontinued          As needed 06/02/20 2012 06/02/20 2038   06/02/20 1744  ceFAZolin (ANCEF) 2-4 GM/100ML-% IVPB       Note to Pharmacy: Henrine Screws   : cabinet override      06/02/20 1744 06/02/20 1831       Objective: Vitals:   06/02/20 2242 06/02/20 2300 06/03/20 0405 06/03/20 0930  BP: 137/68  (!) 105/48 (!) 109/46  Pulse: 93  73 92  Resp: 18  15 16   Temp: 98.7 F (37.1 C)  (!) 97.4 F (36.3 C) 98.2 F (36.8 C)  TempSrc: Oral  Oral Oral  SpO2: 93% 93% 97% 97%  Weight:      Height:        Intake/Output Summary (Last 24 hours) at 06/03/2020 1316 Last data filed at  06/03/2020 1100 Gross per 24 hour  Intake 940 ml  Output 700 ml  Net 240 ml   Filed Weights   06/02/20 1207  Weight: 71.8 kg    Examination:  General exam: Appears calm and comfortable  Respiratory system: Clear to auscultation. Respiratory effort normal. No respiratory distress. No conversational dyspnea.  Cardiovascular system: S1 & S2 heard, RRR. No pedal edema. Gastrointestinal system: Abdomen is nondistended, soft and nontender. Normal bowel sounds heard. Central nervous system: Alert and oriented.  Skin: No rashes, lesions or ulcers on exposed skin  Psychiatry: Judgement and insight appear normal. Mood & affect appropriate.   Data Reviewed: I have personally reviewed following labs and imaging studies  CBC: Recent Labs  Lab 06/02/20 1206 06/03/20 0142  WBC 11.0* 8.0  NEUTROABS 9.3*  --   HGB 11.8* 8.3*  HCT 36.8 25.8*  MCV 94.8 93.5  PLT 269 144   Basic Metabolic Panel: Recent Labs  Lab 06/02/20 1206 06/03/20 0142  NA 137 137  K 4.7 4.4  CL 107 105  CO2 23 22  GLUCOSE 126* 170*  BUN 8 8  CREATININE 0.86 1.15*  CALCIUM  8.7* 8.3*   GFR: Estimated Creatinine Clearance: 34.4 mL/min (A) (by C-G formula based on SCr of 1.15 mg/dL (H)).  Coagulation Profile: Recent Labs  Lab 06/02/20 1206  INR 1.1    Recent Results (from the past 240 hour(s))  SARS Coronavirus 2 by RT PCR (hospital order, performed in [facility] hospital lab) Nasopharyngeal Nasopharyngeal Swab     Status: None   Collection Time: 06/02/20 12:06 PM   Specimen: Nasopharyngeal Swab  Result Value Ref Range Status   SARS Coronavirus 2 NEGATIVE NEGATIVE Final    Comment: (NOTE) SARS-CoV-2 target nucleic acids are NOT DETECTED.  The SARS-CoV-2 RNA is generally detectable in upper and lower respiratory specimens during the acute phase of infection. The lowest concentration of SARS-CoV-2 viral copies this assay can detect is 250 copies / mL. A negative result does not preclude SARS-CoV-2 infection and should not be used as the sole basis for treatment or other patient management decisions.  A negative result may occur with improper specimen collection / handling, submission of specimen other than nasopharyngeal swab, presence of viral mutation(s) within the areas targeted by this assay, and inadequate number of viral copies (<250 copies / mL). A negative result must be combined with clinical observations, patient history, and epidemiological information.  Fact Sheet for Patients:   StrictlyIdeas.no  Fact Sheet for Healthcare Providers: BankingDealers.co.za  This test is not yet approved or  cleared by the [place] FDA and has been authorized for detection and/or diagnosis of SARS-CoV-2 by FDA under an Emergency Use Authorization (EUA).  This EUA will remain in effect (meaning this test can be used) for the duration of the COVID-19 declaration under Section 564(b)(1) of the Act, 21 U.S.C. section 360bbb-3(b)(1), unless the authorization is terminated or revoked sooner.  Performed at  [facility], [place] [address], [place], [place] [address]       Radiology Studies: DG Chest 1 View  Result Date: 06/02/2020 CLINICAL DATA:  Fall. EXAM: CHEST  1 VIEW COMPARISON:  None. FINDINGS: Lungs are adequately inflated and otherwise clear. Cardiomediastinal silhouette is normal. Diffuse decreased bone mineralization. No acute fracture. IMPRESSION: No acute findings. Electronically Signed   By: Marin Olp M.D.   On: 06/02/2020 13:52   DG Pelvis 1-2 Views  Result Date: 06/02/2020 CLINICAL DATA:  Left leg deformity post  fall. EXAM: PELVIS - 1-2 VIEW COMPARISON:  07/28/2018 FINDINGS: Mild diffuse decreased bone mineralization. Minimal degenerate change of the spine and symphysis pubis joint. Subtle symmetric degenerative change of the hips. No acute fracture or dislocation. IMPRESSION: No acute findings. Electronically Signed   By: Marin Olp M.D.   On: 06/02/2020 13:55   CT HEAD WO CONTRAST  Result Date: 06/02/2020 CLINICAL DATA:  Head trauma post fall. EXAM: CT HEAD WITHOUT CONTRAST TECHNIQUE: Contiguous axial images were obtained from the base of the skull through the vertex without intravenous contrast. COMPARISON:  MRI brain 06/01/2019 FINDINGS: Brain: Ventricles, cisterns and other CSF spaces are within normal. There is mild chronic ischemic microvascular disease. There is no intra-axial mass, mass effect or shift of midline structures. No acute hemorrhage. Evidence of known small right parietal meningioma. No acute infarction. Vascular: No hyperdense vessel or unexpected calcification. Skull: Normal. Negative for fracture or focal lesion. Sinuses/Orbits: No acute finding. Other: None. IMPRESSION: 1. No acute findings. 2. Mild chronic ischemic microvascular disease. 3. Evidence of known small right parietal meningioma stable. Electronically Signed   By: Marin Olp M.D.   On: 06/02/2020 13:58   DG C-Arm 1-60 Min  Result Date: 06/02/2020 CLINICAL DATA:  Fracture of the left  femur EXAM: DG C-ARM 1-60 MIN; LEFT FEMUR 2 VIEWS COMPARISON:  None. FINDINGS: Eight intraop views were submitted ORIF with IM nail fixation of the mildly displaced midshaft femur fracture. 211.4 seconds of fluoro 15.37 mGy IMPRESSION: Intraop views of IM nail fixation midshaft femur fracture Electronically Signed   By: Prudencio Pair M.D.   On: 06/02/2020 20:44   DG FEMUR MIN 2 VIEWS LEFT  Result Date: 06/02/2020 CLINICAL DATA:  Fracture of the left femur EXAM: DG C-ARM 1-60 MIN; LEFT FEMUR 2 VIEWS COMPARISON:  None. FINDINGS: Eight intraop views were submitted ORIF with IM nail fixation of the mildly displaced midshaft femur fracture. 211.4 seconds of fluoro 15.37 mGy IMPRESSION: Intraop views of IM nail fixation midshaft femur fracture Electronically Signed   By: Prudencio Pair M.D.   On: 06/02/2020 20:44   DG Femur Min 2 Views Left  Result Date: 06/02/2020 CLINICAL DATA:  Left hip pain post fall. EXAM: LEFT FEMUR 2 VIEWS COMPARISON:  04/04/2020 FINDINGS: Diffuse decreased bone mineralization. Examination demonstrates a displaced slightly comminuted oblique fracture of the distal femoral diaphysis with 1.5 shaft's with posterolateral displacement of the distal fragment. Mild degenerate change of the left knee. IMPRESSION: Displaced oblique fracture of the distal femoral diaphysis. Electronically Signed   By: Marin Olp M.D.   On: 06/02/2020 13:54   DG FEMUR PORT MIN 2 VIEWS LEFT  Result Date: 06/02/2020 CLINICAL DATA:  Left femoral fracture. EXAM: LEFT FEMUR PORTABLE 2 VIEWS COMPARISON:  None. FINDINGS: A radiopaque intramedullary rod is seen throughout the length of the left femoral shaft. Multiple distal fixation screws are noted. A comminuted fracture of the mid left femoral shaft is seen with gross anatomic alignment. There is no evidence of dislocation. Diffuse soft tissue swelling is noted with multiple surgical staple seen overlying the left knee. IMPRESSION: Status post open reduction and  internal fixation of a comminuted fracture of the mid left femoral shaft. Electronically Signed   By: Virgina Norfolk M.D.   On: 06/02/2020 21:42      Scheduled Meds: . docusate sodium  100 mg Oral BID  . enoxaparin (LOVENOX) injection  40 mg Subcutaneous Q24H  . gabapentin  300 mg Oral QHS  . magnesium oxide  400  mg Oral QHS  . oxybutynin  10 mg Oral QHS  . pantoprazole  40 mg Oral Daily  . pregabalin  100 mg Oral Q lunch  . senna  1 tablet Oral BID  . tiZANidine  4 mg Oral QHS  . verapamil  180 mg Oral Daily  . Vitamin D (Ergocalciferol)  50,000 Units Oral Q14 Days   Continuous Infusions:   LOS: 1 day    Time spent: 30 minutes   Shelda Pal, DO [facility] 06/03/2020, 1:16 PM   Available via Epic secure chat 7am-7pm After these hours, please refer to coverage provider listed on [email or web address]

## 2020-06-04 ENCOUNTER — Inpatient Hospital Stay (HOSPITAL_COMMUNITY): Payer: Medicare Other

## 2020-06-04 ENCOUNTER — Encounter (HOSPITAL_COMMUNITY): Payer: Self-pay | Admitting: Orthopedic Surgery

## 2020-06-04 ENCOUNTER — Encounter: Payer: Medicare Other | Admitting: Physical Medicine and Rehabilitation

## 2020-06-04 LAB — HEMOGLOBIN AND HEMATOCRIT, BLOOD
HCT: 30.9 % — ABNORMAL LOW (ref 36.0–46.0)
Hemoglobin: 10.1 g/dL — ABNORMAL LOW (ref 12.0–15.0)

## 2020-06-04 LAB — BASIC METABOLIC PANEL
Anion gap: 8 (ref 5–15)
BUN: 12 mg/dL (ref 8–23)
CO2: 22 mmol/L (ref 22–32)
Calcium: 8.1 mg/dL — ABNORMAL LOW (ref 8.9–10.3)
Chloride: 103 mmol/L (ref 98–111)
Creatinine, Ser: 1.04 mg/dL — ABNORMAL HIGH (ref 0.44–1.00)
GFR calc Af Amer: 58 mL/min — ABNORMAL LOW (ref >60)
GFR calc non Af Amer: 50 mL/min — ABNORMAL LOW (ref >60)
Glucose, Bld: 158 mg/dL — ABNORMAL HIGH (ref 70–99)
Potassium: 4.6 mmol/L (ref 3.5–5.1)
Sodium: 133 mmol/L — ABNORMAL LOW (ref 135–145)

## 2020-06-04 LAB — CBC
HCT: 21.4 % — ABNORMAL LOW (ref 36.0–46.0)
Hemoglobin: 6.9 g/dL — CL (ref 12.0–15.0)
MCH: 30.7 pg (ref 26.0–34.0)
MCHC: 32.2 g/dL (ref 30.0–36.0)
MCV: 95.1 fL (ref 80.0–100.0)
Platelets: 178 10*3/uL (ref 150–400)
RBC: 2.25 MIL/uL — ABNORMAL LOW (ref 3.87–5.11)
RDW: 14.4 % (ref 11.5–15.5)
WBC: 6.1 10*3/uL (ref 4.0–10.5)
nRBC: 0 % (ref 0.0–0.2)

## 2020-06-04 LAB — PREPARE RBC (CROSSMATCH)

## 2020-06-04 MED ORDER — HYDROCODONE-ACETAMINOPHEN 5-325 MG PO TABS: 1.0000 | ORAL_TABLET | ORAL | 0 refills | Status: AC | PRN

## 2020-06-04 MED ORDER — SODIUM CHLORIDE 0.9% IV SOLUTION: Freq: Once | INTRAVENOUS | Status: DC

## 2020-06-04 MED ORDER — HYDROCODONE-ACETAMINOPHEN 5-325 MG PO TABS
1.0000 | ORAL_TABLET | ORAL | Status: DC | PRN
  Administered 2020-06-04 (×4): 2 via ORAL
  Administered 2020-06-05: 1 via ORAL
  Administered 2020-06-05 – 2020-06-07 (×8): 2 via ORAL
  Filled 2020-06-04 (×12): qty 2
  Filled 2020-06-04: qty 1

## 2020-06-04 MED ORDER — ASPIRIN 81 MG PO CHEW
81.0000 mg | CHEWABLE_TABLET | Freq: Two times a day (BID) | ORAL | 0 refills | Status: AC
Start: 2020-06-04 — End: 2020-07-19

## 2020-06-04 NOTE — TOC Initial Note (Signed)
Transition of Care [name]) - Initial/Assessment Note    Patient Details  Name: Tracey Armstrong MRN: [id number] Date of Birth: 10-28-1934  Transition of Care [facility]) CM/SW Contact:    Sharin Mons, RN Phone Number: 06/04/2020, 3:59 PM  Clinical Narrative:            Admitted after a fall, femur fx, s/p IM nailing to femur fx,8/15. From [facility].        RNCM received consult for possible SNF placement at time of discharge. RNCM spoke with patient regarding PT recommendation of SNF placement at time of discharge. Pt doesn't want to go to SNF/REHAB., however, gave NCM the ok to do work up for SNF placement just in case she changes her mind. Patient reported she is currently unable to care for self at  home independently given her current physical needs and fall risk. Patient expressed understanding of PT recommendation. Patient would not give  preference for SNF. RNCM discussed insurance authorization process and provided Medicare SNF ratings list. Patient expressed being hopeful for rehab and to feel better soon. No further questions reported at this time. RNCM to continue to follow and assist with discharge planning needs.  Expected Discharge Plan: Skilled Nursing Facility Barriers to Discharge: Continued Medical Work up, No SNF bed   Patient Goals and CMS Choice Patient states their goals for this hospitalization and ongoing recovery are:: i want to go home and get better CMS Medicare.gov Compare Post Acute Care list provided to:: Patient    Expected Discharge Plan and Services Expected Discharge Plan: [place]                                              Prior Living Arrangements/Services                       Activities of Daily Living Home Assistive Devices/Equipment: Gilford Rile (specify type) ADL Screening (condition at time of admission) Patient's cognitive ability adequate to safely complete daily activities?: Yes Is the patient deaf or  have difficulty hearing?: No Does the patient have difficulty seeing, even when wearing glasses/contacts?: No Does the patient have difficulty concentrating, remembering, or making decisions?: No Patient able to express need for assistance with ADLs?: Yes Does the patient have difficulty dressing or bathing?: No Independently performs ADLs?: Yes (appropriate for developmental age) Does the patient have difficulty walking or climbing stairs?: No Weakness of Legs: Both Weakness of Arms/Hands: None  Permission Sought/Granted                  Emotional Assessment              Admission diagnosis:  Fall [W19.XXXA] Closed displaced oblique fracture of shaft of left femur, initial encounter ([place]) [S72.332A] 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 Synovial cyst of lumbar facet joint 04/05/2020  . Bilateral stenosis of lateral recess of lumbar spine 04/05/2020  . Dehydration 04/05/2020  . Closed wedge compression fracture of L1 vertebra ([place]) 04/04/2020  . Ambulatory dysfunction 04/04/2020  . Intractable low back pain 04/04/2020  . L1 vertebral fracture ([place]) 04/04/2020  . Urine, incontinence, stress female 02/07/2019  . Knee osteoarthritis 02/01/2018  .  Iron deficiency anemia 10/28/2017  . Rhinitis, allergic 07/27/2017  . GERD (gastroesophageal reflux disease) 06/16/2017  . Chronic pain disorder 06/16/2017  . Vitamin D deficiency 05/25/2017  . Hyperlipidemia 05/25/2017  . 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 Pain 02/26/2017  . Chronic low back pain 12/02/2016  . Abdominal pain 03/26/2016  . 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 Peptic esophagitis 03/26/2016  . Neuralgia neuritis, sciatic nerve 03/26/2016  . 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[place]) 06/20/2015  . Chronic insomnia  03/30/2015  . Chronic migraine 01/10/2015  . Depression 04/20/2014  . Generalized anxiety disorder 04/20/2014  . Chronic back pain 04/20/2014  . Chronic daily headache 04/20/2014  . Essential hypertension 04/20/2014  . Migraine headache 04/19/2014   PCP:  Martinique, [name], MD Pharmacy:   [facility] [place], [place] - [address] AT [address] [place] [address] Phone: [phone] Fax: [phone]     Social Determinants of Health (SDOH) Interventions    Readmission Risk Interventions No flowsheet data found.

## 2020-06-04 NOTE — Evaluation (Signed)
Physical Therapy Evaluation Patient Details Name: Tracey Armstrong MRN: [id number] DOB: Apr 27, 1935 Today's Date: 06/04/2020   History of Present Illness  The pt is an 85 yo female presenting s/p IM nail of L femur on 8/14 to repair L femur fx resulting from a fall at her independent living facility ([facility]). The pt received 2 units PRBC 8/16. PMH includes: recent compression fx of L2, anxiety/depression, GERD, and HTN.    Clinical Impression  Pt in bed upon arrival of PT, agreeable to evaluation at this time. Prior to admission the pt was mobilizing with use of a rollator, receiving PT at [facility] where she lived in independent living. The pt now presents with limitations in functional mobility, strength, ROM, activity tolerance, and stability due to above dx and resulting pain, and will continue to benefit from skilled PT to address these deficits. The pt was unable to generate any functional movement of her LLE due to pain during today's session, and required maxA to attempt to sit EOB. The pt was unable to complete the transition to sitting EOB due to pain, and requested return to supine rather than continue with mobility attempt. The pt will benefit from continued skilled therapy to improve mobility and tolerance for activity to allow for transfers and OOB mobility prior to return to [facility].      Follow Up Recommendations SNF;Supervision/Assistance - 24 hour    Equipment Recommendations   (defer to post acute)    Recommendations for Other Services       Precautions / Restrictions Precautions Precautions: Fall Precaution Comments: spinal for comfort (lumbar compression fx) Required Braces or Orthoses:  (pt reports she was no longer wearing her TLSO from last admission) Restrictions Weight Bearing Restrictions: Yes LLE Weight Bearing: Weight bearing as tolerated      Mobility  Bed Mobility Overal bed mobility: Needs Assistance Bed Mobility: Supine to Sit;Sit to Supine      Supine to sit: Max assist Sit to supine: Total assist   General bed mobility comments: pt moves VERY slowly. able to move RLE to EOB, but requires inch by inch movement of LLE by PT to complete, requesting break with guided breathing after each movement of LLE. Yelling in pain with each movement  Transfers Overall transfer level:  (pt unable to achieve sitting EOB at this time, deferred)                        Balance Overall balance assessment: Needs assistance Sitting-balance support: Bilateral upper extremity supported;Feet supported Sitting balance-Leahy Scale: Poor Sitting balance - Comments: pain-limiting, requires assist to maintain                                     Pertinent Vitals/Pain Pain Assessment: Faces Faces Pain Scale: Hurts whole lot Pain Descriptors / Indicators: Grimacing;Sore;Moaning;Crying;Throbbing Pain Intervention(s): Monitored during session;Limited activity within patient's tolerance;Premedicated before session;Repositioned    Home Living Family/patient expects to be discharged to:: Other (Comment)                 Additional Comments: Independent Living at [facility].    Prior Function Level of Independence: Independent;Independent with assistive device(s)         Comments: pt uses rollator at home, was receiving therapy at [facility] until fall     Hand Dominance   Dominant Hand: Right    Extremity/Trunk Assessment   Upper Extremity Assessment  Upper Extremity Assessment: Defer to OT evaluation;Generalized weakness    Lower Extremity Assessment Lower Extremity Assessment: Generalized weakness;LLE deficits/detail LLE Deficits / Details: pt unable to move due to reports of pain, LLE: Unable to fully assess due to pain    Cervical / Trunk Assessment Cervical / Trunk Assessment: Kyphotic  Communication   Communication: No difficulties  Cognition Arousal/Alertness: Awake/alert Behavior During Therapy:  Restless;Anxious Overall Cognitive Status: Impaired/Different from baseline Area of Impairment: Safety/judgement;Memory;Problem solving                     Memory: Decreased short-term memory   Safety/Judgement: Decreased awareness of safety;Decreased awareness of deficits   Problem Solving: Decreased initiation;Requires verbal cues General Comments: Pt with significant anxiety with all movement, very pain-limiting. Needs cues for breathing and discussion for distraction. Pt perseverating on pain through session, ended up repeating all of her complaints ~3 times each through the session      General Comments General comments (skin integrity, edema, etc.): Pt very painful this session despite pre-medication. used guided breathing between wach movement and conversation for distraction which helped the pt a lot    Exercises General Exercises - Lower Extremity Ankle Circles/Pumps: AROM;AAROM;Both;5 reps;Supine Heel Slides: AROM;Right;5 reps;Supine Hip ABduction/ADduction: AROM;Right;10 reps;Supine   Assessment/Plan    PT Assessment Patient needs continued PT services  PT Problem List Decreased strength;Decreased mobility;Decreased safety awareness;Decreased range of motion;Decreased activity tolerance;Decreased balance;Pain       PT Treatment Interventions DME instruction;Therapeutic exercise;Gait training;Stair training;Functional mobility training;Therapeutic activities;Patient/family education;Balance training    PT Goals (Current goals can be found in the Care Plan section)  Acute Rehab PT Goals Patient Stated Goal: return to [facility] PT Goal Formulation: With patient Time For Goal Achievement: 06/18/20 Potential to Achieve Goals: Fair    Frequency Min 3X/week   Barriers to discharge Inaccessible home environment pt from independent living       AM-PAC PT "6 Clicks" Mobility  Outcome Measure Help needed turning from your back to your side while in a flat bed  without using bedrails?: A Lot Help needed moving from lying on your back to sitting on the side of a flat bed without using bedrails?: A Lot Help needed moving to and from a bed to a chair (including a wheelchair)?: Total Help needed standing up from a chair using your arms (e.g., wheelchair or bedside chair)?: Total Help needed to walk in hospital room?: Total Help needed climbing 3-5 steps with a railing? : Total 6 Click Score: 8    End of Session   Activity Tolerance: Patient limited by pain Patient left: in bed;with call bell/phone within reach;with bed alarm set (with CM in room) Nurse Communication: Mobility status;Patient requests pain meds PT Visit Diagnosis: Muscle weakness (generalized) (M62.81);Difficulty in walking, not elsewhere classified (R26.2);Pain Pain - Right/Left: Left Pain - part of body: Hip;Knee;Leg    Time: 4332-9518 PT Time Calculation (min) (ACUTE ONLY): 41 min   Charges:   PT Evaluation $PT Eval Moderate Complexity: 1 Mod PT Treatments $Gait Training: 8-22 mins $Therapeutic Activity: 8-22 mins        Karma Ganja, PT, DPT   Acute Rehabilitation Department Pager #: [phone]  Otho Bellows 06/04/2020, 4:37 PM

## 2020-06-04 NOTE — Progress Notes (Signed)
Subjective: 2 Days Post-Op Procedure(s) (LRB): INTRAMEDULLARY (IM) RETROGRADE FEMORAL NAILING - LEFT (Left)  Patient reports pain as moderate to severe.  States that she's contiuing to struggle with pain from her hip and low back fracture.  Denies fever, chills, N/V, CP, SOB.  Admits to flatus.  Objective:   VITALS:  Temp:  [98.2 F (36.8 C)-98.6 F (37 C)] 98.6 F (37 C) (08/16 0700) Pulse Rate:  [73-100] 87 (08/16 0718) Resp:  [15-18] 15 (08/16 0718) BP: (83-128)/(41-54) 98/47 (08/16 0718) SpO2:  [93 %-100 %] 98 % (08/16 0700)  General: WDWN patient in NAD. Psych:  Appropriate mood and affect. Neuro:  A&O x 3, Moving all extremities, sensation intact to light touch HEENT:  EOMs intact Chest:  Even non-labored respirations Skin: Dressing C/D/I, no rashes or lesions Extremities: warm/dry, mild edema to L hip, no erythema or echymosis.  No lymphadenopathy. Pulses: Popliteus 2+ MSK:  ROM: TKE, MMT: able to perform quad set, (-) Homan's    LABS Recent Labs    06/02/20 1206 06/02/20 1206 06/03/20 0142 06/04/20 0121  HGB 11.8*  --  8.3* 6.9*  WBC 11.0*   < > 8.0 6.1  PLT 269   < > 230 178   < > = values in this interval not displayed.   Recent Labs    06/03/20 0142 06/04/20 0121  NA 137 133*  K 4.4 4.6  CL 105 103  CO2 22 22  BUN 8 12  CREATININE 1.15* 1.04*  GLUCOSE 170* 158*   Recent Labs    06/02/20 1206  INR 1.1     Assessment/Plan: 2 Days Post-Op Procedure(s) (LRB): INTRAMEDULLARY (IM) RETROGRADE FEMORAL NAILING - LEFT (Left)  TDWB L LE Up with therapy DVT ppx: lovenox ABLA: Treat per hospitalist recommendations Disp: pending   Cherylann Parr [facility] Office:  [phone]

## 2020-06-04 NOTE — [place] FL2 (Signed)
[place] MEDICAID FL2 LEVEL OF CARE SCREENING TOOL     IDENTIFICATION  Patient Name: Tracey Armstrong Birthdate: 08/14/1935 Sex: female Admission Date (Current Location): 06/02/2020  [facility] and [place] Number:  Herbalist and Address:  [facility], [place] [address] [place], [place] [address]      Provider Number: [id number]  Attending Physician Name and Address:  Shelda Pal,*  Relative Name and Phone Number:       Current Level of Care: Hospital Recommended Level of Care: [place] Prior Approval Number:    Date Approved/Denied:   PASRR Number: [id number]  Discharge Plan: SNF    Current Diagnoses: Patient Active Problem List   Diagnosis Date Noted  . 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 Synovial cyst of lumbar facet joint 04/05/2020  . Bilateral stenosis of lateral recess of lumbar spine 04/05/2020  . Dehydration 04/05/2020  . Closed wedge compression fracture of L1 vertebra ([place]) 04/04/2020  . Ambulatory dysfunction 04/04/2020  . Intractable low back pain 04/04/2020  . L1 vertebral fracture ([place]) 04/04/2020  . Urine, incontinence, stress female 02/07/2019  . Knee osteoarthritis 02/01/2018  . Iron deficiency anemia 10/28/2017  . Rhinitis, allergic 07/27/2017  . GERD (gastroesophageal reflux disease) 06/16/2017  . Chronic pain disorder 06/16/2017  . Vitamin D deficiency 05/25/2017  . Hyperlipidemia 05/25/2017  . 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 Pain 02/26/2017  . Chronic low back pain 12/02/2016  . Abdominal pain 03/26/2016  . 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 Peptic esophagitis 03/26/2016  . Neuralgia neuritis, sciatic nerve 03/26/2016  . 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[place]) 06/20/2015  . Chronic insomnia 03/30/2015  . Chronic migraine 01/10/2015  . Depression 04/20/2014  . Generalized anxiety disorder 04/20/2014  . Chronic back pain 04/20/2014  . Chronic daily headache 04/20/2014  .  Essential hypertension 04/20/2014  . Migraine headache 04/19/2014    Orientation RESPIRATION BLADDER Height & Weight     Self, Time, Situation, Place  Normal External catheter Weight: 71.8 kg Height:  5\' 2"  (157.5 cm)  BEHAVIORAL SYMPTOMS/MOOD NEUROLOGICAL BOWEL NUTRITION STATUS      Continent Diet (refer to d/c summary)  AMBULATORY STATUS COMMUNICATION OF NEEDS Skin   Extensive Assist Verbally Normal, Surgical wounds (s/p intramedullary fixation of left femur,8/14)                       Personal Care Assistance Level of Assistance  Bathing, Dressing, Feeding Bathing Assistance: Maximum assistance Feeding assistance: Independent Dressing Assistance: Maximum assistance     Functional Limitations Info  Sight, Hearing, Speech Sight Info: Adequate Hearing Info: Adequate Speech Info: Adequate    SPECIAL CARE FACTORS FREQUENCY  PT (By licensed PT), OT (By licensed OT)     PT Frequency: 5x/ week, evaluate and treat OT Frequency: 5x/ week, evaluate and treat            Contractures Contractures Info: Not present    Additional Factors Info  Allergies, Code Status Code Status Info: full code Allergies Info: Dihydrotachysterol, Trazodone And Nefazodone, Valium Diazepam, Citalopram, Other           Current Medications (06/04/2020):  This is the current hospital active medication list Current Facility-Administered Medications  Medication Dose Route Frequency Provider Last Rate Last Admin  . 0.9 %  sodium chloride infusion (Manually program via Guardrails IV Fluids)   Intravenous Once Blount, Scarlette Shorts T, NP      . docusate sodium (COLACE) capsule  100 mg  100 mg Oral BID Cherlynn June B, PA   100 mg at 06/03/20 2233  . enoxaparin (LOVENOX) injection 40 mg  40 mg Subcutaneous Q24H Cherlynn June B, PA   40 mg at 06/04/20 1229  . gabapentin (NEURONTIN) capsule 300 mg  300 mg Oral QHS Cherlynn June B, PA   300 mg at 06/03/20 2232  . hydrALAZINE (APRESOLINE) injection 10  mg  10 mg Intravenous Q6H PRN Dorothyann Peng, PA      . HYDROcodone-acetaminophen (NORCO/VICODIN) 5-325 MG per tablet 1-2 tablet  1-2 tablet Oral Q4H PRN Shelda Pal, DO   2 tablet at 06/04/20 1412  . magnesium oxide (MAG-OX) tablet 400 mg  400 mg Oral QHS Cherlynn June B, PA   400 mg at 06/03/20 2234  . melatonin tablet 9 mg  9 mg Oral Once Lovey Newcomer T, NP      . menthol-cetylpyridinium (CEPACOL) lozenge 3 mg  1 lozenge Oral PRN Cherlynn June B, PA       Or  . phenol (CHLORASEPTIC) mouth spray 1 spray  1 spray Mouth/Throat PRN Cherlynn June B, PA      . metoCLOPramide (REGLAN) tablet 5-10 mg  5-10 mg Oral Q8H PRN Cherlynn June B, PA       Or  . metoCLOPramide (REGLAN) injection 5-10 mg  5-10 mg Intravenous Q8H PRN Cherlynn June B, PA      . morphine 2 MG/ML injection 0.5-1 mg  0.5-1 mg Intravenous Q2H PRN Rod Can, MD   1 mg at 06/04/20 1548  . ondansetron (ZOFRAN) tablet 4 mg  4 mg Oral Q6H PRN Cherlynn June B, PA       Or  . ondansetron (ZOFRAN) injection 4 mg  4 mg Intravenous Q6H PRN Cherlynn June B, PA      . oxybutynin (DITROPAN-XL) 24 hr tablet 10 mg  10 mg Oral QHS Cherlynn June B, PA   10 mg at 06/03/20 2241  . pantoprazole (PROTONIX) EC tablet 40 mg  40 mg Oral Daily Cherlynn June B, [place]   40 mg at 06/04/20 1314  . pregabalin (LYRICA) capsule 100 mg  100 mg Oral Q lunch Cherlynn June B, PA   100 mg at 06/04/20 1229  . senna (SENOKOT) tablet 8.6 mg  1 tablet Oral BID Cherlynn June B, PA   8.6 mg at 06/03/20 2234  . tiZANidine (ZANAFLEX) tablet 4 mg  4 mg Oral QHS Cherlynn June B, PA   4 mg at 06/03/20 2232  . Vitamin D (Ergocalciferol) (DRISDOL) capsule 50,000 Units  50,000 Units Oral Q14 Days Dorothyann Peng, [place]         Discharge Medications: Please see discharge summary for a list of discharge medications.  Relevant Imaging Results:  Relevant Lab Results:   Additional Information    Sharin Mons, RN

## 2020-06-04 NOTE — Progress Notes (Signed)
PROGRESS NOTE    Tracey Armstrong  QJJ:[id number] DOB: Nov 08, 1934 DOA: 06/02/2020 PCP: [place], [name], MD     Brief Narrative:  Patient is an 85 year old female with a history of anxiety/depression, reflux, hypertension who was admitted on 8/14 after sustaining a fall on a wood floor.  X-ray showed an oblique fracture of the distal femur.  She was evaluated by the orthopedic surgery team recommended admission and fixation.   New events last 24 hours / Subjective: Morning labs showed hemoglobin dropping below 7.  She received 2 units of packed red blood cells.  Follow-up CBC is pending.  She continues to have pain.  Due to softer blood pressures, she could not receive morphine.  Norco was written for every 6 hours.  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             PT- she declined eval yesterday, I encouraged her to work with them today and simply do her best             Reglan prn nausea  Active Problems:   Anemia, likely acute blood loss  2u pRBCs  Monitor Hb now and tomorrow AM     Essential hypertension             Hydralazine 10 mg q 6 hrs prn             Will hold Calan SR 180 mg/d     GERD             Protonix 40 mg/d     Vit D def             Vit D 50k u q 14 d    OAB                            Ditropan XL 10 mg qhs     Hx of compression fx  DVT prophylaxis:  enoxaparin (LOVENOX) injection 40 mg Start: 06/03/20 1000 SCDs Start: 06/02/20 2300 Place TED hose Start: 06/02/20 2300  Code Status: Full Family Communication: Self Disposition Plan:   Status is: Inpatient  Remains inpatient appropriate because:Ongoing active pain requiring inpatient pain management   Dispo: The patient is from: Home              Anticipated d/c is to: TBD  pending PT eval              Anticipated d/c date is: 2 days              Patient currently is not medically stable to d/c.  Consultants:   Orthopedic surgery  Procedures:   Intramed retrograde femoral nailing, Left; 06/02/2020  Antimicrobials:  Anti-infectives (From admission, onward)   Start     Dose/Rate Route Frequency Ordered Stop   06/03/20 0600  ceFAZolin (ANCEF) IVPB 2g/100 mL premix        2 g 200 mL/hr over 30 Minutes Intravenous On call to  O.R. 06/02/20 1741 06/02/20 1856   06/02/20 2359  ceFAZolin (ANCEF) IVPB 2g/100 mL premix        2 g 200 mL/hr over 30 Minutes Intravenous Every 6 hours 06/02/20 2259 06/03/20 0649   06/02/20 2012  vancomycin (VANCOCIN) powder  Status:  Discontinued          As needed 06/02/20 2012 06/02/20 2038   06/02/20 1744  ceFAZolin (ANCEF) 2-4 GM/100ML-% IVPB       Note to Pharmacy: Henrine Screws   : cabinet override      06/02/20 1744 06/02/20 1831        Objective: Vitals:   06/04/20 0718 06/04/20 0905 06/04/20 0944 06/04/20 1237  BP: (!) 98/47 (!) 94/45 (!) 90/46 (!) 111/50  Pulse: 87 82 77 96  Resp: 15 16 15 15   Temp:  98.6 F (37 C) 98.5 F (36.9 C) 98.7 F (37.1 C)  TempSrc:  Oral Oral Oral  SpO2:  98% 98% 98%  Weight:      Height:        Intake/Output Summary (Last 24 hours) at 06/04/2020 1339 Last data filed at 06/04/2020 0602 Gross per 24 hour  Intake 240 ml  Output 800 ml  Net -560 ml   Filed Weights   06/02/20 1207  Weight: 71.8 kg    Examination:  General exam: Appears in pain   Respiratory system: Clear to auscultation. Respiratory effort normal. No respiratory distress. No conversational dyspnea.  Cardiovascular system: S1 & S2 heard, RRR. No murmurs. No pedal edema. Gastrointestinal system: Abdomen is nondistended, soft and nontender. Normal bowel sounds heard. Central nervous system: Alert and oriented. No focal neurological deficits. Speech clear.  Extremities: No calf pain Skin: No rashes, lesions or  ulcers on exposed skin  Psychiatry: Judgement and insight appear normal. Mood & affect appropriate.   Data Reviewed: I have personally reviewed following labs and imaging studies  CBC: Recent Labs  Lab 06/02/20 1206 06/03/20 0142 06/04/20 0121  WBC 11.0* 8.0 6.1  NEUTROABS 9.3*  --   --   HGB 11.8* 8.3* 6.9*  HCT 36.8 25.8* 21.4*  MCV 94.8 93.5 95.1  PLT 269 230 250   Basic Metabolic Panel: Recent Labs  Lab 06/02/20 1206 06/03/20 0142 06/04/20 0121  NA 137 137 133*  K 4.7 4.4 4.6  CL 107 105 103  CO2 23 22 22   GLUCOSE 126* 170* 158*  BUN 8 8 12   CREATININE 0.86 1.15* 1.04*  CALCIUM 8.7* 8.3* 8.1*   Coagulation Profile: Recent Labs  Lab 06/02/20 1206  INR 1.1   Recent Results (from the past 240 hour(s))  SARS Coronavirus 2 by RT PCR (hospital order, performed in [facility] hospital lab) Nasopharyngeal Nasopharyngeal Swab     Status: None   Collection Time: 06/02/20 12:06 PM   Specimen: Nasopharyngeal Swab  Result Value Ref Range Status   SARS Coronavirus 2 NEGATIVE NEGATIVE Final    Comment: (NOTE) SARS-CoV-2 target nucleic acids are NOT DETECTED.  The SARS-CoV-2 RNA is generally detectable in upper and lower respiratory specimens during the acute phase of infection. The lowest concentration of SARS-CoV-2 viral copies this assay can detect is 250 copies / mL. A negative result does not preclude SARS-CoV-2 infection and should not be used as the sole basis for treatment or other patient management decisions.  A negative result may occur with improper specimen collection / handling, submission of specimen other than nasopharyngeal swab, presence of viral mutation(s) within the areas targeted by this  assay, and inadequate number of viral copies (<250 copies / mL). A negative result must be combined with clinical observations, patient history, and epidemiological information.  Fact Sheet for Patients:   StrictlyIdeas.no  Fact Sheet  for Healthcare Providers: BankingDealers.co.za  This test is not yet approved or  cleared by the [place] FDA and has been authorized for detection and/or diagnosis of SARS-CoV-2 by FDA under an Emergency Use Authorization (EUA).  This EUA will remain in effect (meaning this test can be used) for the duration of the COVID-19 declaration under Section 564(b)(1) of the Act, 21 U.S.C. section 360bbb-3(b)(1), unless the authorization is terminated or revoked sooner.  Performed at [facility], [place] [address], [place], [place] [address]       Radiology Studies: DG C-Arm 1-60 Min  Result Date: 06/02/2020 CLINICAL DATA:  Fracture of the left femur EXAM: DG C-ARM 1-60 MIN; LEFT FEMUR 2 VIEWS COMPARISON:  None. FINDINGS: Eight intraop views were submitted ORIF with IM nail fixation of the mildly displaced midshaft femur fracture. 211.4 seconds of fluoro 15.37 mGy IMPRESSION: Intraop views of IM nail fixation midshaft femur fracture Electronically Signed   By: Prudencio Pair M.D.   On: 06/02/2020 20:44   DG FEMUR MIN 2 VIEWS LEFT  Result Date: 06/02/2020 CLINICAL DATA:  Fracture of the left femur EXAM: DG C-ARM 1-60 MIN; LEFT FEMUR 2 VIEWS COMPARISON:  None. FINDINGS: Eight intraop views were submitted ORIF with IM nail fixation of the mildly displaced midshaft femur fracture. 211.4 seconds of fluoro 15.37 mGy IMPRESSION: Intraop views of IM nail fixation midshaft femur fracture Electronically Signed   By: Prudencio Pair M.D.   On: 06/02/2020 20:44   DG FEMUR PORT MIN 2 VIEWS LEFT  Result Date: 06/02/2020 CLINICAL DATA:  Left femoral fracture. EXAM: LEFT FEMUR PORTABLE 2 VIEWS COMPARISON:  None. FINDINGS: A radiopaque intramedullary rod is seen throughout the length of the left femoral shaft. Multiple distal fixation screws are noted. A comminuted fracture of the mid left femoral shaft is seen with gross anatomic alignment. There is no evidence of dislocation. Diffuse  soft tissue swelling is noted with multiple surgical staple seen overlying the left knee. IMPRESSION: Status post open reduction and internal fixation of a comminuted fracture of the mid left femoral shaft. Electronically Signed   By: Virgina Norfolk M.D.   On: 06/02/2020 21:42      Scheduled Meds: . sodium chloride   Intravenous Once  . docusate sodium  100 mg Oral BID  . enoxaparin (LOVENOX) injection  40 mg Subcutaneous Q24H  . gabapentin  300 mg Oral QHS  . magnesium oxide  400 mg Oral QHS  . melatonin  9 mg Oral Once  . oxybutynin  10 mg Oral QHS  . pantoprazole  40 mg Oral Daily  . pregabalin  100 mg Oral Q lunch  . senna  1 tablet Oral BID  . tiZANidine  4 mg Oral QHS  . verapamil  180 mg Oral Daily  . Vitamin D (Ergocalciferol)  50,000 Units Oral Q14 Days   Continuous Infusions:   LOS: 2 days   Time spent: 25 minutes   Shelda Pal, DO [facility] 06/04/2020, 1:39 PM   Available via Epic secure chat 7am-7pm After these hours, please refer to coverage provider listed on [email or web address]

## 2020-06-04 NOTE — Progress Notes (Signed)
1st of 2 units PRBC started

## 2020-06-05 LAB — BPAM RBC
Blood Product Expiration Date: 202109072359
Blood Product Expiration Date: 202109072359
ISSUE DATE / TIME: 202108160331
ISSUE DATE / TIME: 202108160918
Unit Type and Rh: 600
Unit Type and Rh: 600

## 2020-06-05 LAB — CBC
HCT: 26.1 % — ABNORMAL LOW (ref 36.0–46.0)
Hemoglobin: 8.7 g/dL — ABNORMAL LOW (ref 12.0–15.0)
MCH: 29.8 pg (ref 26.0–34.0)
MCHC: 33.3 g/dL (ref 30.0–36.0)
MCV: 89.4 fL (ref 80.0–100.0)
Platelets: 151 10*3/uL (ref 150–400)
RBC: 2.92 MIL/uL — ABNORMAL LOW (ref 3.87–5.11)
RDW: 14.7 % (ref 11.5–15.5)
WBC: 6.3 10*3/uL (ref 4.0–10.5)
nRBC: 0 % (ref 0.0–0.2)

## 2020-06-05 LAB — TYPE AND SCREEN
ABO/RH(D): A NEG
Antibody Screen: POSITIVE
Unit division: 0
Unit division: 0

## 2020-06-05 LAB — BASIC METABOLIC PANEL
Anion gap: 6 (ref 5–15)
BUN: 9 mg/dL (ref 8–23)
CO2: 26 mmol/L (ref 22–32)
Calcium: 8.5 mg/dL — ABNORMAL LOW (ref 8.9–10.3)
Chloride: 102 mmol/L (ref 98–111)
Creatinine, Ser: 0.7 mg/dL (ref 0.44–1.00)
GFR calc Af Amer: >60 mL/min (ref >60)
GFR calc non Af Amer: >60 mL/min (ref >60)
Glucose, Bld: 144 mg/dL — ABNORMAL HIGH (ref 70–99)
Potassium: 4.3 mmol/L (ref 3.5–5.1)
Sodium: 134 mmol/L — ABNORMAL LOW (ref 135–145)

## 2020-06-05 MED ORDER — GABAPENTIN 300 MG PO CAPS
300.0000 mg | ORAL_CAPSULE | Freq: Two times a day (BID) | ORAL | Status: DC
  Administered 2020-06-05 – 2020-06-07 (×4): 300 mg via ORAL
  Filled 2020-06-05 (×4): qty 1

## 2020-06-05 MED ORDER — ALPRAZOLAM 0.5 MG PO TABS
0.5000 mg | ORAL_TABLET | Freq: Every evening | ORAL | Status: DC | PRN
  Administered 2020-06-05 – 2020-06-06 (×2): 0.5 mg via ORAL
  Filled 2020-06-05 (×2): qty 1

## 2020-06-05 NOTE — Progress Notes (Signed)
Physical Therapy Treatment Patient Details Name: Tracey Armstrong MRN: [id number] DOB: [birth year] Today's Date: 06/05/2020    History of Present Illness The pt is an 85 yo female presenting s/p IM nail of L femur on 8/14 to repair L femur fx resulting from a fall at her independent living facility ([facility]). The pt received 2 units PRBC 8/16. PMH includes: recent compression fx of L2, anxiety/depression, GERD, and HTN.    PT Comments    Pt supine in bed on arrival this session.  Pt required max cues for encouragement to participate in PT session.  Performed limited ROM to LLE with increased time.  Pt refused to mobilize and required total +2 to reposition in bed to allow for eating her lunch tray.  Pt remains self limiting due to pain but has yet to maximize her true potential.  She resists facilitation at times which hinder progress. Informed RN of patient request for increased pain meds.  Pt continues to benefit from snf placement at d/c.      Follow Up Recommendations  SNF;Supervision/Assistance - 24 hour     Equipment Recommendations   (defer to post acute)    Recommendations for Other Services       Precautions / Restrictions Precautions Precautions: Fall Precaution Comments: spinal for comfort (lumbar compression fx) Required Braces or Orthoses:  (Per eval from patient report-no longer wearing spinal brace.) Restrictions Weight Bearing Restrictions: Yes LLE Weight Bearing: Weight bearing as tolerated    Mobility  Bed Mobility               General bed mobility comments: Pt refused to mobilize to edge of bed bed due to pain.  Performed total +2 to boost to [facility] and placed patient in modified chair position.  Max cues for encouragement to allow for repositioning.  Transfers Overall transfer level:  (NT unable)                  Ambulation/Gait                 Stairs             Wheelchair Mobility    Modified Rankin (Stroke Patients Only)        Balance                                            Cognition Arousal/Alertness: Awake/alert Behavior During Therapy: Restless;Anxious Overall Cognitive Status: Impaired/Different from baseline Area of Impairment: Safety/judgement;Memory;Problem solving                     Memory: Decreased short-term memory   Safety/Judgement: Decreased awareness of safety;Decreased awareness of deficits   Problem Solving: Decreased initiation;Requires verbal cues General Comments: Pt with significant anxiety with all movement, very pain-limiting. Needs cues for breathing and discussion for distraction. Pt perseverating on pain through session, ended up repeating all of her complaints ~10 times each through the session      Exercises General Exercises - Lower Extremity Ankle Circles/Pumps: AROM;Both;10 reps;Supine;AAROM Quad Sets: AROM;Left;10 reps;Supine Heel Slides: AAROM;Left;Supine;Limitations (x3 reps) Heel Slides Limitations: decreased ROM Hip ABduction/ADduction: 10 reps;Supine;Left;AAROM;Limitations Hip Abduction/Adduction Limitations: decreased ROM.    General Comments        Pertinent Vitals/Pain Pain Assessment: Faces Faces Pain Scale: Hurts even more Pain Descriptors / Indicators: Grimacing;Sore;Moaning;Crying;Throbbing Pain Intervention(s): Monitored during session;Repositioned    Home  Living                      Prior Function            PT Goals (current goals can now be found in the care plan section) Acute Rehab PT Goals Patient Stated Goal: to be left alone to eat her corn after PT session Potential to Achieve Goals: Fair Progress towards PT goals: Progressing toward goals    Frequency    Min 3X/week      PT Plan Current plan remains appropriate    Co-evaluation              AM-PAC PT "6 Clicks" Mobility   Outcome Measure  Help needed turning from your back to your side while in a flat bed without using  bedrails?: Total Help needed moving from lying on your back to sitting on the side of a flat bed without using bedrails?: Total Help needed moving to and from a bed to a chair (including a wheelchair)?: Total Help needed standing up from a chair using your arms (e.g., wheelchair or bedside chair)?: Total Help needed to walk in hospital room?: Total Help needed climbing 3-5 steps with a railing? : Total 6 Click Score: 6    End of Session   Activity Tolerance: Patient limited by pain Patient left: in bed;with call bell/phone within reach;with bed alarm set (in modified chair position) Nurse Communication: Mobility status;Patient requests pain meds PT Visit Diagnosis: Muscle weakness (generalized) (M62.81);Difficulty in walking, not elsewhere classified (R26.2);Pain Pain - Right/Left: Left Pain - part of body: Hip;Knee;Leg     Time: 0177-9390 PT Time Calculation (min) (ACUTE ONLY): 29 min  Charges:  $Therapeutic Exercise: 8-22 mins $Therapeutic Activity: 8-22 mins                     Erasmo Leventhal , PTA Acute Rehabilitation Services Pager [phone] Office [phone]     Garielle Mroz Eli Hose 06/05/2020, 3:42 PM

## 2020-06-05 NOTE — TOC CAGE-AID Note (Signed)
Transition of Care [facility]) - CAGE-AID Screening   Patient Details  Name: Tracey Armstrong MRN: [id number] Date of Birth: [birth year]  [facility] CM/SW Contact:    Emeterio Reeve, [place] Phone Number: 06/05/2020, 2:58 PM   Clinical Narrative:  CSW met with pt at bedside. CSW introduced self and explained her role at the hospital.  PT denies alcohol use and substance use. Pt did not need any resources at this time.   Emeterio Reeve, Latanya Presser, [place] Clinical Social Worker [phone]    CAGE-AID Screening:    Have You Ever Felt You Ought to Cut Down on Your Drinking or Drug Use?: No Have People Annoyed You By Critizing Your Drinking Or Drug Use?: No Have You Felt Bad Or Guilty About Your Drinking Or Drug Use?: No Have You Ever Had a Drink or Used Drugs First Thing In The Morning to Steady Your Nerves or to Get Rid of a Hangover?: No CAGE-AID Score: 0  Substance Abuse Education Offered: Yes  Substance abuse interventions: Patient Counseling

## 2020-06-05 NOTE — TOC Progression Note (Signed)
Transition of Care [facility]) - Progression Note    Patient Details  Name: Tracey Armstrong MRN: [id number] Date of Birth: 1935-05-05  Transition [facility]) CM/SW Contact  Sharin Mons, RN Phone Number: 06/05/2020, 2:13 PM  Clinical Narrative:    SNF bed offer accepted by pt. Pt will transition to [facility] once medically ready ... working more therapy needed, PT.   TOC team will continue to monitor and follow for needs...  Expected Discharge Plan: [place] ([facility]) Barriers to Discharge: Continued Medical Work up  Expected Discharge Plan and Services Expected Discharge Plan: [place] ([facility])                                               Social Determinants of Health (SDOH) Interventions    Readmission Risk Interventions No flowsheet data found.

## 2020-06-05 NOTE — Plan of Care (Signed)
  Problem: Activity: Goal: Ability to avoid complications of mobility impairment will improve Outcome: Progressing Goal: Ability to tolerate increased activity will improve Outcome: Progressing   Problem: Activity: Goal: Ability to avoid complications of mobility impairment will improve Outcome: Progressing Goal: Ability to tolerate increased activity will improve Outcome: Progressing   Problem: Activity: Goal: Ability to avoid complications of mobility impairment will improve Outcome: Progressing Goal: Ability to tolerate increased activity will improve Outcome: Progressing

## 2020-06-05 NOTE — Progress Notes (Signed)
PROGRESS NOTE    Tracey Armstrong  PQZ:[id number] DOB: Jun 06, 1935 DOA: 06/02/2020 PCP: [place], [name], MD    Brief Narrative:  85 year old female from independent living facility, history of anxiety/depression, reflux, hypertension, chronic pain syndrome on Lyrica, gabapentin as well as Xanax at night presented to the emergency room after sustaining a fall on a wooden floor on 8/14.  Found to have left distal femoral fracture.  Admitted for surgical fixation.   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 Rod Can. Still has significant pain, using hydrocodone and morphine. On gabapentin at home, will increase dose to 300 mg twice a day.  Continue Lyrica. Toe-touch weightbearing Lovenox 40 mg for 4 weeks Postop surgical follow-up. Skilled nursing facility placement before discharge back to independent place.  Acute blood loss anemia: Anticipated from long bone fracture. Hemoglobin 11-postop 6.9-2 units of PRBC-appropriately responded.  Recheck tomorrow morning to ensure stabilization.  Hypertension: Blood pressures stable.  On as needed medications.  GERD: On PPI.  Continue.  Continue to work with PT OT.  Adequate pain relief.  Bowel regimen.  Refer to SNF.   DVT prophylaxis: enoxaparin (LOVENOX) injection 40 mg Start: 06/03/20 1000 SCDs Start: 06/02/20 2300 Place TED hose Start: 06/02/20 2300   Code Status: Full code Family Communication: None Disposition Plan: Status is: Inpatient  Remains inpatient appropriate because:Unsafe d/c plan   Dispo: The patient is from: Home              Anticipated d/c is to: SNF              Anticipated d/c date is: 2 days              Patient currently is medically stable to d/c.         Consultants:   Orthopedics  Procedures:   IM nail left  femur, 8/14  Antimicrobials:   None   Subjective: Patient seen and examined.  She complained of severe pain on her left knee and hip.  She is anxious. Has not mobilized much.  Objective: Vitals:   06/04/20 1237 06/04/20 1930 06/05/20 0236 06/05/20 0847  BP: (!) 111/50 (!) 157/52 (!) 99/45 (!) 124/58  Pulse: 96 (!) 105 68 (!) 101  Resp: 15 17 16 18   Temp: 98.7 F (37.1 C) 99.2 F (37.3 C) 97.7 F (36.5 C) 98.2 F (36.8 C)  TempSrc: Oral Oral Oral Oral  SpO2: 98% 99% 99% 97%  Weight:      Height:       No intake or output data in the 24 hours ending 06/05/20 1036 Filed Weights   06/02/20 1207  Weight: 71.8 kg    Examination:  General exam: Appears calm and comfortable at rest. Slightly anxious and in mild distress with pain. Respiratory system: Clear to auscultation. Respiratory effort normal. Cardiovascular system: S1 & S2 heard, RRR. No JVD, murmurs, rubs, gallops or clicks. No pedal edema. Gastrointestinal system: Abdomen is nondistended, soft and nontender. No organomegaly or masses felt. Normal bowel sounds heard. Left thigh and knee, appropriate postop swelling, incisions intact and dry Distal neurovascular status intact    Data Reviewed: I have personally reviewed following labs and imaging studies  CBC: Recent Labs  Lab 06/02/20 1206 06/03/20 0142 06/04/20 0121 06/04/20 1439 06/05/20 0228  WBC 11.0* 8.0 6.1  --  6.3  NEUTROABS 9.3*  --   --   --   --   HGB 11.8* 8.3* 6.9* 10.1* 8.7*  HCT 36.8 25.8* 21.4* 30.9* 26.1*  MCV 94.8 93.5 95.1  --  89.4  PLT 269 230 178  --  932   Basic Metabolic Panel: Recent Labs  Lab 06/02/20 1206 06/03/20 0142 06/04/20 0121 06/05/20 0228  NA 137 137 133* 134*  K 4.7 4.4 4.6 4.3  CL 107 105 103 102  CO2 23 22 22 26   GLUCOSE 126* 170* 158* 144*  BUN 8 8 12 9   CREATININE 0.86 1.15* 1.04* 0.70  CALCIUM 8.7* 8.3* 8.1* 8.5*   GFR: Estimated Creatinine Clearance: 49.5 mL/min (by C-G formula based on SCr of 0.7  mg/dL). Liver Function Tests: No results for input(s): AST, ALT, ALKPHOS, BILITOT, PROT, ALBUMIN in the last 168 hours. No results for input(s): LIPASE, AMYLASE in the last 168 hours. No results for input(s): AMMONIA in the last 168 hours. Coagulation Profile: Recent Labs  Lab 06/02/20 1206  INR 1.1   Cardiac Enzymes: No results for input(s): CKTOTAL, CKMB, CKMBINDEX, TROPONINI in the last 168 hours. BNP (last 3 results) No results for input(s): PROBNP in the last 8760 hours. HbA1C: No results for input(s): HGBA1C in the last 72 hours. CBG: No results for input(s): GLUCAP in the last 168 hours. Lipid Profile: No results for input(s): CHOL, HDL, LDLCALC, TRIG, CHOLHDL, LDLDIRECT in the last 72 hours. Thyroid Function Tests: No results for input(s): TSH, T4TOTAL, FREET4, T3FREE, THYROIDAB in the last 72 hours. Anemia Panel: No results for input(s): VITAMINB12, FOLATE, FERRITIN, TIBC, IRON, RETICCTPCT in the last 72 hours. Sepsis Labs: No results for input(s): PROCALCITON, LATICACIDVEN in the last 168 hours.  Recent Results (from the past 240 hour(s))  SARS Coronavirus 2 by RT PCR (hospital order, performed in [facility] hospital lab) Nasopharyngeal Nasopharyngeal Swab     Status: None   Collection Time: 06/02/20 12:06 PM   Specimen: Nasopharyngeal Swab  Result Value Ref Range Status   SARS Coronavirus 2 NEGATIVE NEGATIVE Final    Comment: (NOTE) SARS-CoV-2 target nucleic acids are NOT DETECTED.  The SARS-CoV-2 RNA is generally detectable in upper and lower respiratory specimens during the acute phase of infection. The lowest concentration of SARS-CoV-2 viral copies this assay can detect is 250 copies / mL. A negative result does not preclude SARS-CoV-2 infection and should not be used as the sole basis for treatment or other patient management decisions.  A negative result may occur with improper specimen collection / handling, submission of specimen other than  nasopharyngeal swab, presence of viral mutation(s) within the areas targeted by this assay, and inadequate number of viral copies (<250 copies / mL). A negative result must be combined with clinical observations, patient history, and epidemiological information.  Fact Sheet for Patients:   StrictlyIdeas.no  Fact Sheet for Healthcare Providers: BankingDealers.co.za  This test is not yet approved or  cleared by the [place] FDA and has been authorized for detection and/or diagnosis of SARS-CoV-2 by FDA under an Emergency Use Authorization (EUA).  This EUA will remain in effect (meaning this test can be used) for the duration of the COVID-19 declaration under Section 564(b)(1) of the Act, 21 U.S.C. section 360bbb-3(b)(1), unless the authorization is terminated or revoked sooner.  Performed at [facility], [place] [address], [place], [place] [address]          Radiology Studies: DG Lumbar Spine 2-3 Views  Result  Date: 06/04/2020 CLINICAL DATA:  Status post fall. EXAM: LUMBAR SPINE - 2-3 VIEW COMPARISON:  May 17, 2020 FINDINGS: There is no evidence of an acute lumbar spine fracture. A chronic compression fracture deformity is seen involving the L1 vertebral body. There is mild levoscoliosis of the upper lumbar spine. Mild endplate sclerosis is seen throughout the lumbar spine. Intervertebral disc spaces are maintained. IMPRESSION: Chronic compression fracture deformity of the L1 vertebral body. Electronically Signed   By: Virgina Norfolk M.D.   On: 06/04/2020 21:12        Scheduled Meds: . sodium chloride   Intravenous Once  . docusate sodium  100 mg Oral BID  . enoxaparin (LOVENOX) injection  40 mg Subcutaneous Q24H  . gabapentin  300 mg Oral BID  . magnesium oxide  400 mg Oral QHS  . melatonin  9 mg Oral Once  . oxybutynin  10 mg Oral QHS  . pantoprazole  40 mg Oral Daily  . pregabalin  100 mg Oral Q lunch  . senna   1 tablet Oral BID  . tiZANidine  4 mg Oral QHS  . Vitamin D (Ergocalciferol)  50,000 Units Oral Q14 Days   Continuous Infusions:   LOS: 3 days    Time spent: 30 minutes    Barb Merino, MD [facility] Pager [phone]

## 2020-06-06 LAB — CBC WITH DIFFERENTIAL/PLATELET
Abs Immature Granulocytes: 0.02 10*3/uL (ref 0.00–0.07)
Basophils Absolute: 0 10*3/uL (ref 0.0–0.1)
Basophils Relative: 0 %
Eosinophils Absolute: 0.1 10*3/uL (ref 0.0–0.5)
Eosinophils Relative: 1 %
HCT: 31.5 % — ABNORMAL LOW (ref 36.0–46.0)
Hemoglobin: 10.5 g/dL — ABNORMAL LOW (ref 12.0–15.0)
Immature Granulocytes: 0 %
Lymphocytes Relative: 33 %
Lymphs Abs: 2.1 10*3/uL (ref 0.7–4.0)
MCH: 30.3 pg (ref 26.0–34.0)
MCHC: 33.3 g/dL (ref 30.0–36.0)
MCV: 91 fL (ref 80.0–100.0)
Monocytes Absolute: 0.7 10*3/uL (ref 0.1–1.0)
Monocytes Relative: 11 %
Neutro Abs: 3.5 10*3/uL (ref 1.7–7.7)
Neutrophils Relative %: 55 %
Platelets: 207 10*3/uL (ref 150–400)
RBC: 3.46 MIL/uL — ABNORMAL LOW (ref 3.87–5.11)
RDW: 14.1 % (ref 11.5–15.5)
WBC: 6.4 10*3/uL (ref 4.0–10.5)
nRBC: 0 % (ref 0.0–0.2)

## 2020-06-06 MED ORDER — DOCUSATE SODIUM 100 MG PO CAPS: 100.0000 mg | ORAL_CAPSULE | Freq: Two times a day (BID) | ORAL | 0 refills | Status: DC

## 2020-06-06 MED ORDER — PREGABALIN 100 MG PO CAPS: 100.0000 mg | ORAL_CAPSULE | Freq: Every day | ORAL | Status: DC

## 2020-06-06 MED ORDER — GABAPENTIN 300 MG PO CAPS: 300.0000 mg | ORAL_CAPSULE | Freq: Two times a day (BID) | ORAL | Status: DC

## 2020-06-06 MED ORDER — SENNA 8.6 MG PO TABS: 1.0000 | ORAL_TABLET | Freq: Two times a day (BID) | ORAL | 0 refills | Status: DC

## 2020-06-06 NOTE — TOC Progression Note (Signed)
Transition of Care [facility]) - Progression Note    Patient Details  Name: Tracey Armstrong MRN: [id number] Date of Birth: 01-May-1935  Transition of Care [facility]) CM/SW Contact  Sharin Mons, RN Phone Number: [phone] 06/06/2020, 1:34 PM  Clinical Narrative:     Pt s/p IM nail of L femur on 8/14. D/C to SNF placed on hold for today. Pt with c/o persistent pain, limited work with therapy ... MD to f/u with ortho MD to assess. TOC team will continue to monitor and follow for needs.   Expected Discharge Plan: [place] Barriers to Discharge: Continued Medical Work up  Expected Discharge Plan and Services Expected Discharge Plan: [place]         Expected Discharge Date: 06/06/20                                     Social Determinants of Health (SDOH) Interventions    Readmission Risk Interventions No flowsheet data found.

## 2020-06-06 NOTE — Care Management Important Message (Signed)
Important Message  Patient Details  Name: Tracey Armstrong MRN: [id number] Date of Birth: Jul 19, 1935   Medicare Important Message Given:  Yes     Iysha Iverson Sees 06/06/2020, 4:11 PM

## 2020-06-06 NOTE — Discharge Summary (Addendum)
Physician Discharge Summary  Tracey Armstrong RCV:[id number] DOB: September 03, 1935 DOA: 06/02/2020  PCP: Martinique, [name], MD  Admit date: 06/02/2020 Discharge date: 06/06/2020  Admitted From: Independent living Disposition: SNF  Recommendations for Outpatient Follow-up:  1. Follow up with PCP in 1-2 weeks 2. Please obtain BMP/CBC in one week your next doctors visit.  3. Aspirin for DVT prophylaxis 4. Lyrica and gabapentin prescribed 5. Norco with bowel regimen prescribed 6. Weightbearing as tolerated 7. Follow-up outpatient orthopedic in 2 weeks  Discharge Condition: Stable CODE STATUS: Full code Diet recommendation: 2 g salt  Brief/Interim Summary: 85 year old female from independent living facility, history of anxiety/depression, reflux, hypertension, chronic pain syndrome on Lyrica, gabapentin as well as Xanax at night presented to the emergency room after sustaining a fall on a wooden floor on 8/14.  Found to have left distal femoral fracture.  Admitted for surgical fixation.  Patient underwent IM nailing 8/14 by orthopedic.  Postop had a lot of issues with pain control therefore require further adjustments in her narcotic dosing, gabapentin was increased along with Lyrica.  Aspirin twice daily was prescribed by orthopedic for DVT prophylaxis. Patient is currently stable to be transferred to skilled nursing facility for further care and management.  Patient may require ongoing needs and daily assessment and adjustments in her pain medications.  Son, [name] called and updated.   Closed traumatic left femur fracture status post IM nail 8/14 -Postop pain control and medication adjusted by orthopedic.  Gabapentin dose has been increased.  Continue Lyrica. -Aspirin twice daily prescribed by orthopedic for DVT prophylaxis.  Outpatient follow-up in 2 weeks  Acute blood loss anemia -Anticipated for long bone fracture.  Baseline hemoglobin 11.  Required 2 units PRBC.  Now hemoglobin is stable.  This  morning is 10.5.  Essential hypertension -Resume home meds  GERD -PPI  Chronic low back pain secondary to compression fracture -Continue pain control   Body mass index is 28.95 kg/m.         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  Discharge Exam: Vitals:   06/06/20 0445 06/06/20 0825  BP: (!) 127/55 (!) 140/48  Pulse: 87 94  Resp: 16 16  Temp: 98.6 F (37 C) 97.9 F (36.6 C)  SpO2: 93% 98%   Vitals:   06/05/20 1300 06/05/20 1954 06/06/20 0445 06/06/20 0825  BP: (!) 131/55 (!) 158/81 (!) 127/55 (!) 140/48  Pulse: 88 100 87 94  Resp:  18 16 16   Temp: 98.5 F (36.9 C) 97.9 F (36.6 C) 98.6 F (37 C) 97.9 F (36.6 C)  TempSrc: Oral Oral Oral Oral  SpO2: 96% 97% 93% 98%  Weight:      Height:        General: Pt is alert, awake, not in acute distress Cardiovascular: RRR, S1/S2 +, no rubs, no gallops Respiratory: CTA bilaterally, no wheezing, no rhonchi Abdominal: Soft, NT, ND, bowel sounds + Extremities: no edema, no cyanosis Left lower extremity dressing noted, no obvious evidence of bleeding or infection.  Dressing currently in place.  Discharge Instructions   Allergies as of 06/06/2020      Reactions   Dihydrotachysterol Other (See Comments)   Unknown reaction   Trazodone And Nefazodone Other (See Comments)   Worsens headaches  Valium [diazepam] Other (See Comments)   headaches   Citalopram Other (See Comments)   Other reaction(s): Headache   Other Other (See Comments)   "States all antidepressants cause migraines." - the only antidepressant tolerated is xanax      Medication List    STOP taking these medications   lansoprazole 15 MG capsule Commonly known as: PREVACID     TAKE these  medications   acetaminophen 650 MG CR tablet Commonly known as: TYLENOL Take 975 mg by mouth daily.   ALPRAZolam 1 MG tablet Commonly known as: XANAX Take 0.5 tablets (0.5 mg total) by mouth 2 (two) times daily as needed for up to 10 doses for anxiety. What changed:   how much to take  when to take this   aspirin 81 MG chewable tablet Commonly known as: Aspirin Childrens Chew 1 tablet (81 mg total) by mouth 2 (two) times daily.   diphenhydramine-acetaminophen 25-500 MG Tabs tablet Commonly known as: TYLENOL PM Take 1 tablet by mouth at bedtime.   docusate sodium 100 MG capsule Commonly known as: COLACE Take 1 capsule (100 mg total) by mouth 2 (two) times daily.   gabapentin 300 MG capsule Commonly known as: NEURONTIN Take 1 capsule (300 mg total) by mouth 2 (two) times daily. What changed: when to take this   HYDROcodone-acetaminophen 5-325 MG tablet Commonly known as: NORCO/VICODIN Take 1 tablet by mouth every 4 (four) hours as needed for up to 7 days for moderate pain.   Magnesium 400 MG Tabs Take 400 mg by mouth at bedtime.   Magnesium 250 MG Tabs Take 250 mg by mouth daily. 2 PO DAILY   Maxalt-MLT 10 MG disintegrating tablet Generic drug: rizatriptan DISSOLVE ONE TABLET BY MOUTH AS NEEDED HEADACHE What changed: See the new instructions.   Melatonin 10 MG Tabs Take 10 mg by mouth at bedtime.   omeprazole 20 MG tablet Commonly known as: PRILOSEC OTC Take 20 mg by mouth daily before breakfast.   oxybutynin 10 MG 24 hr tablet Commonly known as: Ditropan XL Take 1 tablet (10 mg total) by mouth at bedtime.   POTASSIUM PO Take 1 tablet by mouth daily with lunch.   pregabalin 100 MG capsule Commonly known as: LYRICA Take 1 capsule (100 mg total) by mouth daily with lunch. What changed:   medication strength  See the new instructions.   pyridOXINE 100 MG tablet Commonly known as: VITAMIN B-6 Take 400 mg by mouth daily with lunch.   riboflavin 100 MG  Tabs tablet Commonly known as: VITAMIN B-2 Take 400 mg by mouth daily with lunch.   Riboflavin 400 MG Caps Take 1 capsule (400 mg total) by mouth daily.   senna 8.6 MG Tabs tablet Commonly known as: SENOKOT Take 1 tablet (8.6 mg total) by mouth 2 (two) times daily.   tiZANidine 4 MG tablet Commonly known as: ZANAFLEX Take 1 tablet (4 mg total) by mouth 2 (two) times daily as needed. What changed: when to take this   tretinoin 0.1 % cream Commonly known as: RETIN-A APPLY TO AFFECTED AREA EVERY DAY AT BEDTIME What changed: See the new instructions.   Turmeric 500 MG Caps Take 500 mg by mouth daily with lunch.   verapamil 180 MG 24 hr capsule Commonly known as: VERELAN PM TAKE 1 CAPSULE(180 MG) BY MOUTH DAILY What changed:   how much to take  how to take this  when to take this  additional instructions   VITAMIN B-12 PO Take 1  tablet by mouth daily with lunch.   Vitamin D (Ergocalciferol) 1.25 MG (50000 UNIT) Caps capsule Commonly known as: DRISDOL 1 cap every 2 weeks. What changed:   how much to take  how to take this  when to take this  additional instructions       Contact information for follow-up providers    Swinteck, Aaron Edelman, MD. Schedule an appointment as soon as possible for a visit in 2 weeks.   Specialty: Orthopedic Surgery Why: For suture removal, For wound re-check Contact information: [address] [place] [address] [phone]        Martinique, [name], MD. Schedule an appointment as soon as possible for a visit in 1 week(s).   Specialty: Family Medicine Contact information: [place] [address] [phone]            Contact information for after-discharge care    Destination    [facility] .   Service: Skilled Nursing Contact information: [place] [address] [phone]                 Allergies  Allergen Reactions  .  Dihydrotachysterol Other (See Comments)    Unknown reaction  . Trazodone And Nefazodone Other (See Comments)    Worsens headaches  . Valium [Diazepam] Other (See Comments)    headaches  . Citalopram Other (See Comments)    Other reaction(s): Headache  . Other Other (See Comments)    "States all antidepressants cause migraines." - the only antidepressant tolerated is xanax    You were cared for by a hospitalist during your hospital stay. If you have any questions about your discharge medications or the care you received while you were in the hospital after you are discharged, you can call the unit and asked to speak with the hospitalist on call if the hospitalist that took care of you is not available. Once you are discharged, your primary care physician will handle any further medical issues. Please note that no refills for any discharge medications will be authorized once you are discharged, as it is imperative that you return to your primary care physician (or establish a relationship with a primary care physician if you do not have one) for your aftercare needs so that they can reassess your need for medications and monitor your lab values.   Procedures/Studies: DG Chest 1 View  Result Date: 06/02/2020 CLINICAL DATA:  Fall. EXAM: CHEST  1 VIEW COMPARISON:  None. FINDINGS: Lungs are adequately inflated and otherwise clear. Cardiomediastinal silhouette is normal. Diffuse decreased bone mineralization. No acute fracture. IMPRESSION: No acute findings. Electronically Signed   By: Marin Olp M.D.   On: 06/02/2020 13:52   DG Lumbar Spine 2-3 Views  Result Date: 06/04/2020 CLINICAL DATA:  Status post fall. EXAM: LUMBAR SPINE - 2-3 VIEW COMPARISON:  May 17, 2020 FINDINGS: There is no evidence of an acute lumbar spine fracture. A chronic compression fracture deformity is seen involving the L1 vertebral body. There is mild levoscoliosis of the upper lumbar spine. Mild endplate sclerosis is seen  throughout the lumbar spine. Intervertebral disc spaces are maintained. IMPRESSION: Chronic compression fracture deformity of the L1 vertebral body. Electronically Signed   By: Virgina Norfolk M.D.   On: 06/04/2020 21:12   DG Pelvis 1-2 Views  Result Date: 06/02/2020 CLINICAL DATA:  Left leg deformity post fall. EXAM: PELVIS - 1-2 VIEW COMPARISON:  07/28/2018 FINDINGS: Mild diffuse decreased bone mineralization.  Minimal degenerate change of the spine and symphysis pubis joint. Subtle symmetric degenerative change of the hips. No acute fracture or dislocation. IMPRESSION: No acute findings. Electronically Signed   By: Marin Olp M.D.   On: 06/02/2020 13:55   CT HEAD WO CONTRAST  Result Date: 06/02/2020 CLINICAL DATA:  Head trauma post fall. EXAM: CT HEAD WITHOUT CONTRAST TECHNIQUE: Contiguous axial images were obtained from the base of the skull through the vertex without intravenous contrast. COMPARISON:  MRI brain 06/01/2019 FINDINGS: Brain: Ventricles, cisterns and other CSF spaces are within normal. There is mild chronic ischemic microvascular disease. There is no intra-axial mass, mass effect or shift of midline structures. No acute hemorrhage. Evidence of known small right parietal meningioma. No acute infarction. Vascular: No hyperdense vessel or unexpected calcification. Skull: Normal. Negative for fracture or focal lesion. Sinuses/Orbits: No acute finding. Other: None. IMPRESSION: 1. No acute findings. 2. Mild chronic ischemic microvascular disease. 3. Evidence of known small right parietal meningioma stable. Electronically Signed   By: Marin Olp M.D.   On: 06/02/2020 13:58   DG C-Arm 1-60 Min  Result Date: 06/02/2020 CLINICAL DATA:  Fracture of the left femur EXAM: DG C-ARM 1-60 MIN; LEFT FEMUR 2 VIEWS COMPARISON:  None. FINDINGS: Eight intraop views were submitted ORIF with IM nail fixation of the mildly displaced midshaft femur fracture. 211.4 seconds of fluoro 15.37 mGy IMPRESSION:  Intraop views of IM nail fixation midshaft femur fracture Electronically Signed   By: Prudencio Pair M.D.   On: 06/02/2020 20:44   DG FEMUR MIN 2 VIEWS LEFT  Result Date: 06/02/2020 CLINICAL DATA:  Fracture of the left femur EXAM: DG C-ARM 1-60 MIN; LEFT FEMUR 2 VIEWS COMPARISON:  None. FINDINGS: Eight intraop views were submitted ORIF with IM nail fixation of the mildly displaced midshaft femur fracture. 211.4 seconds of fluoro 15.37 mGy IMPRESSION: Intraop views of IM nail fixation midshaft femur fracture Electronically Signed   By: Prudencio Pair M.D.   On: 06/02/2020 20:44   DG Femur Min 2 Views Left  Result Date: 06/02/2020 CLINICAL DATA:  Left hip pain post fall. EXAM: LEFT FEMUR 2 VIEWS COMPARISON:  04/04/2020 FINDINGS: Diffuse decreased bone mineralization. Examination demonstrates a displaced slightly comminuted oblique fracture of the distal femoral diaphysis with 1.5 shaft's with posterolateral displacement of the distal fragment. Mild degenerate change of the left knee. IMPRESSION: Displaced oblique fracture of the distal femoral diaphysis. Electronically Signed   By: Marin Olp M.D.   On: 06/02/2020 13:54   DG FEMUR PORT MIN 2 VIEWS LEFT  Result Date: 06/02/2020 CLINICAL DATA:  Left femoral fracture. EXAM: LEFT FEMUR PORTABLE 2 VIEWS COMPARISON:  None. FINDINGS: A radiopaque intramedullary rod is seen throughout the length of the left femoral shaft. Multiple distal fixation screws are noted. A comminuted fracture of the mid left femoral shaft is seen with gross anatomic alignment. There is no evidence of dislocation. Diffuse soft tissue swelling is noted with multiple surgical staple seen overlying the left knee. IMPRESSION: Status post open reduction and internal fixation of a comminuted fracture of the mid left femoral shaft. Electronically Signed   By: Virgina Norfolk M.D.   On: 06/02/2020 21:42      The results of significant diagnostics from this hospitalization (including  imaging, microbiology, ancillary and laboratory) are listed below for reference.     Microbiology: Recent Results (from the past 240 hour(s))  SARS Coronavirus 2 by RT PCR (hospital order, performed in [facility] hospital lab) Nasopharyngeal Nasopharyngeal Swab  Status: None   Collection Time: 06/02/20 12:06 PM   Specimen: Nasopharyngeal Swab  Result Value Ref Range Status   SARS Coronavirus 2 NEGATIVE NEGATIVE Final    Comment: (NOTE) SARS-CoV-2 target nucleic acids are NOT DETECTED.  The SARS-CoV-2 RNA is generally detectable in upper and lower respiratory specimens during the acute phase of infection. The lowest concentration of SARS-CoV-2 viral copies this assay can detect is 250 copies / mL. A negative result does not preclude SARS-CoV-2 infection and should not be used as the sole basis for treatment or other patient management decisions.  A negative result may occur with improper specimen collection / handling, submission of specimen other than nasopharyngeal swab, presence of viral mutation(s) within the areas targeted by this assay, and inadequate number of viral copies (<250 copies / mL). A negative result must be combined with clinical observations, patient history, and epidemiological information.  Fact Sheet for Patients:   StrictlyIdeas.no  Fact Sheet for Healthcare Providers: BankingDealers.co.za  This test is not yet approved or  cleared by the [place] FDA and has been authorized for detection and/or diagnosis of SARS-CoV-2 by FDA under an Emergency Use Authorization (EUA).  This EUA will remain in effect (meaning this test can be used) for the duration of the COVID-19 declaration under Section 564(b)(1) of the Act, 21 U.S.C. section 360bbb-3(b)(1), unless the authorization is terminated or revoked sooner.  Performed at [facility], [place] [address], [place], [place] [address]      Labs: BNP  (last 3 results) No results for input(s): BNP in the last 8760 hours. Basic Metabolic Panel: Recent Labs  Lab 06/02/20 1206 06/03/20 0142 06/04/20 0121 06/05/20 0228  NA 137 137 133* 134*  K 4.7 4.4 4.6 4.3  CL 107 105 103 102  CO2 23 22 22 26   GLUCOSE 126* 170* 158* 144*  BUN 8 8 12 9   CREATININE 0.86 1.15* 1.04* 0.70  CALCIUM 8.7* 8.3* 8.1* 8.5*   Liver Function Tests: No results for input(s): AST, ALT, ALKPHOS, BILITOT, PROT, ALBUMIN in the last 168 hours. No results for input(s): LIPASE, AMYLASE in the last 168 hours. No results for input(s): AMMONIA in the last 168 hours. CBC: Recent Labs  Lab 06/02/20 1206 06/02/20 1206 06/03/20 0142 06/04/20 0121 06/04/20 1439 06/05/20 0228 06/06/20 0247  WBC 11.0*  --  8.0 6.1  --  6.3 6.4  NEUTROABS 9.3*  --   --   --   --   --  3.5  HGB 11.8*   < > 8.3* 6.9* 10.1* 8.7* 10.5*  HCT 36.8   < > 25.8* 21.4* 30.9* 26.1* 31.5*  MCV 94.8  --  93.5 95.1  --  89.4 91.0  PLT 269  --  230 178  --  151 207   < > = values in this interval not displayed.   Cardiac Enzymes: No results for input(s): CKTOTAL, CKMB, CKMBINDEX, TROPONINI in the last 168 hours. BNP: Invalid input(s): POCBNP CBG: No results for input(s): GLUCAP in the last 168 hours. D-Dimer No results for input(s): DDIMER in the last 72 hours. Hgb A1c No results for input(s): HGBA1C in the last 72 hours. Lipid Profile No results for input(s): CHOL, HDL, LDLCALC, TRIG, CHOLHDL, LDLDIRECT in the last 72 hours. Thyroid function studies No results for input(s): TSH, T4TOTAL, T3FREE, THYROIDAB in the last 72 hours.  Invalid input(s): FREET3 Anemia work up No results for input(s): VITAMINB12, FOLATE, FERRITIN, TIBC, IRON, RETICCTPCT in the last 72 hours.  Urinalysis    Component Value Date/Time   BILIRUBINUR neg 06/10/2017 1728   PROTEINUR neg 06/10/2017 1728   UROBILINOGEN 1.0 06/10/2017 1728   NITRITE neg 06/10/2017 1728   LEUKOCYTESUR Negative 06/10/2017 1728    Sepsis Labs Invalid input(s): PROCALCITONIN,  WBC,  LACTICIDVEN Microbiology Recent Results (from the past 240 hour(s))  SARS Coronavirus 2 by RT PCR (hospital order, performed in [facility] hospital lab) Nasopharyngeal Nasopharyngeal Swab     Status: None   Collection Time: 06/02/20 12:06 PM   Specimen: Nasopharyngeal Swab  Result Value Ref Range Status   SARS Coronavirus 2 NEGATIVE NEGATIVE Final    Comment: (NOTE) SARS-CoV-2 target nucleic acids are NOT DETECTED.  The SARS-CoV-2 RNA is generally detectable in upper and lower respiratory specimens during the acute phase of infection. The lowest concentration of SARS-CoV-2 viral copies this assay can detect is 250 copies / mL. A negative result does not preclude SARS-CoV-2 infection and should not be used as the sole basis for treatment or other patient management decisions.  A negative result may occur with improper specimen collection / handling, submission of specimen other than nasopharyngeal swab, presence of viral mutation(s) within the areas targeted by this assay, and inadequate number of viral copies (<250 copies / mL). A negative result must be combined with clinical observations, patient history, and epidemiological information.  Fact Sheet for Patients:   StrictlyIdeas.no  Fact Sheet for Healthcare Providers: BankingDealers.co.za  This test is not yet approved or  cleared by the [place] FDA and has been authorized for detection and/or diagnosis of SARS-CoV-2 by FDA under an Emergency Use Authorization (EUA).  This EUA will remain in effect (meaning this test can be used) for the duration of the COVID-19 declaration under Section 564(b)(1) of the Act, 21 U.S.C. section 360bbb-3(b)(1), unless the authorization is terminated or revoked sooner.  Performed at [facility], [place] [address] [place], [place] [address]      Time coordinating discharge:  I  have spent 35 minutes face to face with the patient and on the ward discussing the patients care, assessment, plan and disposition with other care givers. >50% of the time was devoted counseling the patient about the risks and benefits of treatment/Discharge disposition and coordinating care.   SIGNED:   Damita Lack, MD  [facility] 06/06/2020, 12:32 PM   If 7PM-7AM, please contact night-coverage

## 2020-06-06 NOTE — Consult Note (Signed)
   [facility] CM Inpatient Consult   06/06/2020  Tracey Armstrong 1935/05/07 [id number]  [place] [place] [ACO] Patient: Medicare Next Gen    Patient screened for high risk  for unplanned readmission score noted.   Review of patient's medical record reveals patient is for SNF.   Primary Care Provider is Martinique, [name], MD this provider is listed to provide the transition of care [TOC] for post hospital follow up.  Plan:  Follow up with inpatient [facility] team for disposition needs and progress notes. If she transitions to a [facility] affiliate facility will alert [facility] RN PAC of transition.  Continue to follow progress and disposition to assess for post hospital care management needs.    Please place a [facility] Care Management consult as appropriate and for questions contact:   Natividad Brood, RN BSN [facility] Liaison  [phone] business mobile phone Toll free office [phone]  Fax number: [phone] [email or web address]

## 2020-06-06 NOTE — Progress Notes (Signed)
Physical Therapy Treatment Patient Details Name: Tracey Armstrong MRN: [id number] DOB: 08-16-1935 Today's Date: 06/06/2020    History of Present Illness The pt is an 85 yo female presenting s/p IM nail of L femur on 8/14 to repair L femur fx resulting from a fall at her independent living facility ([facility]). The pt received 2 units PRBC 8/16. PMH includes: recent compression fx of L2, anxiety/depression, GERD, and HTN.    PT Comments    Pt continues to present with self limiting behavior but with direct commands and keeping patient on task she was able to stand in [place] stedy and move from bed to recliner.  Continue to recommend snf placement. ]    Follow Up Recommendations  SNF;Supervision/Assistance - 24 hour     Equipment Recommendations       Recommendations for Other Services       Precautions / Restrictions Precautions Precautions: Fall Precaution Comments: spinal for comfort (lumbar compression fx) Restrictions Weight Bearing Restrictions: Yes LLE Weight Bearing: Weight bearing as tolerated    Mobility  Bed Mobility Overal bed mobility: Needs Assistance Bed Mobility: Supine to Sit     Supine to sit: Mod assist;+2 for physical assistance     General bed mobility comments: Pt required assistance for LE advancement and trunk elevation.  Transfers Overall transfer level: Needs assistance Equipment used: Ambulation equipment used (sara stedy) Transfers: Sit to/from Stand Sit to Stand: Mod assist;+2 physical assistance         General transfer comment: Cues for sequencing and hand placement, utilized bed pad to boost into standing.  Ambulation/Gait                 Stairs             Wheelchair Mobility    Modified Rankin (Stroke Patients Only)       Balance Overall balance assessment: Needs assistance Sitting-balance support: Bilateral upper extremity supported;Feet supported Sitting balance-Leahy Scale: Poor       Standing  balance-Leahy Scale: Poor Standing balance comment: external assistance and BUE support.                            Cognition Arousal/Alertness: Awake/alert Behavior During Therapy: Restless;Anxious Overall Cognitive Status: Impaired/Different from baseline Area of Impairment: Safety/judgement;Memory;Problem solving                     Memory: Decreased short-term memory   Safety/Judgement: Decreased awareness of safety;Decreased awareness of deficits   Problem Solving: Decreased initiation;Requires verbal cues General Comments: Pt responds better to direct commands to avoid perseveration.      Exercises      General Comments        Pertinent Vitals/Pain Pain Assessment: Faces Faces Pain Scale: Hurts even more Pain Descriptors / Indicators: Grimacing;Sore;Moaning;Crying;Throbbing Pain Intervention(s): Monitored during session;Repositioned    Home Living                      Prior Function            PT Goals (current goals can now be found in the care plan section) Acute Rehab PT Goals Patient Stated Goal: to brush her teeth Potential to Achieve Goals: Fair Progress towards PT goals: Progressing toward goals    Frequency           PT Plan Current plan remains appropriate    Co-evaluation  AM-PAC PT "6 Clicks" Mobility   Outcome Measure  Help needed turning from your back to your side while in a flat bed without using bedrails?: Total Help needed moving from lying on your back to sitting on the side of a flat bed without using bedrails?: Total Help needed moving to and from a bed to a chair (including a wheelchair)?: Total Help needed standing up from a chair using your arms (e.g., wheelchair or bedside chair)?: Total Help needed to walk in hospital room?: Total Help needed climbing 3-5 steps with a railing? : Total 6 Click Score: 6    End of Session Equipment Utilized During Treatment: Gait  belt Activity Tolerance: Patient limited by pain Patient left: in bed;with call bell/phone within reach;with bed alarm set Nurse Communication: Mobility status;Patient requests pain meds PT Visit Diagnosis: Muscle weakness (generalized) (M62.81);Difficulty in walking, not elsewhere classified (R26.2);Pain Pain - Right/Left: Left Pain - part of body: Hip;Knee;Leg     Time: 1335-1411 PT Time Calculation (min) (ACUTE ONLY): 36 min  Charges:  $Therapeutic Activity: 23-37 mins                     Erasmo Leventhal , PTA Acute Rehabilitation Services Pager [phone] Office [phone]     Hope Brandenburger Eli Hose 06/06/2020, 5:52 PM

## 2020-06-07 DIAGNOSIS — F39 Unspecified mood [affective] disorder: Secondary | ICD-10-CM | POA: Diagnosis not present

## 2020-06-07 DIAGNOSIS — D329 Benign neoplasm of meninges, unspecified: Secondary | ICD-10-CM | POA: Diagnosis not present

## 2020-06-07 DIAGNOSIS — S72332A Displaced oblique fracture of shaft of left femur, initial encounter for closed fracture: Secondary | ICD-10-CM | POA: Diagnosis not present

## 2020-06-07 DIAGNOSIS — R52 Pain, unspecified: Secondary | ICD-10-CM | POA: Diagnosis not present

## 2020-06-07 DIAGNOSIS — G8929 Other chronic pain: Secondary | ICD-10-CM | POA: Diagnosis not present

## 2020-06-07 DIAGNOSIS — R2681 Unsteadiness on feet: Secondary | ICD-10-CM | POA: Diagnosis not present

## 2020-06-07 DIAGNOSIS — F329 Major depressive disorder, single episode, unspecified: Secondary | ICD-10-CM | POA: Diagnosis not present

## 2020-06-07 DIAGNOSIS — R5381 Other malaise: Secondary | ICD-10-CM | POA: Diagnosis not present

## 2020-06-07 DIAGNOSIS — F29 Unspecified psychosis not due to a substance or known physiological condition: Secondary | ICD-10-CM | POA: Diagnosis not present

## 2020-06-07 DIAGNOSIS — R262 Difficulty in walking, not elsewhere classified: Secondary | ICD-10-CM | POA: Diagnosis not present

## 2020-06-07 DIAGNOSIS — R41841 Cognitive communication deficit: Secondary | ICD-10-CM | POA: Diagnosis not present

## 2020-06-07 DIAGNOSIS — K219 Gastro-esophageal reflux disease without esophagitis: Secondary | ICD-10-CM | POA: Diagnosis not present

## 2020-06-07 DIAGNOSIS — Z20822 Contact with and (suspected) exposure to covid-19: Secondary | ICD-10-CM | POA: Diagnosis not present

## 2020-06-07 DIAGNOSIS — S12090D Other displaced fracture of first cervical vertebra, subsequent encounter for fracture with routine healing: Secondary | ICD-10-CM | POA: Diagnosis not present

## 2020-06-07 DIAGNOSIS — M1388 Other specified arthritis, other site: Secondary | ICD-10-CM | POA: Diagnosis not present

## 2020-06-07 DIAGNOSIS — K5901 Slow transit constipation: Secondary | ICD-10-CM | POA: Diagnosis not present

## 2020-06-07 DIAGNOSIS — M1990 Unspecified osteoarthritis, unspecified site: Secondary | ICD-10-CM | POA: Diagnosis not present

## 2020-06-07 DIAGNOSIS — E782 Mixed hyperlipidemia: Secondary | ICD-10-CM | POA: Diagnosis not present

## 2020-06-07 DIAGNOSIS — Z20828 Contact with and (suspected) exposure to other viral communicable diseases: Secondary | ICD-10-CM | POA: Diagnosis not present

## 2020-06-07 DIAGNOSIS — Z9181 History of falling: Secondary | ICD-10-CM | POA: Diagnosis not present

## 2020-06-07 DIAGNOSIS — I10 Essential (primary) hypertension: Secondary | ICD-10-CM | POA: Diagnosis not present

## 2020-06-07 DIAGNOSIS — D62 Acute posthemorrhagic anemia: Secondary | ICD-10-CM | POA: Diagnosis not present

## 2020-06-07 DIAGNOSIS — Z4789 Encounter for other orthopedic aftercare: Secondary | ICD-10-CM | POA: Diagnosis not present

## 2020-06-07 DIAGNOSIS — U071 COVID-19: Secondary | ICD-10-CM | POA: Diagnosis not present

## 2020-06-07 DIAGNOSIS — S72332S Displaced oblique fracture of shaft of left femur, sequela: Secondary | ICD-10-CM | POA: Diagnosis not present

## 2020-06-07 DIAGNOSIS — Z7401 Bed confinement status: Secondary | ICD-10-CM | POA: Diagnosis not present

## 2020-06-07 DIAGNOSIS — R2689 Other abnormalities of gait and mobility: Secondary | ICD-10-CM | POA: Diagnosis not present

## 2020-06-07 DIAGNOSIS — F411 Generalized anxiety disorder: Secondary | ICD-10-CM | POA: Diagnosis not present

## 2020-06-07 DIAGNOSIS — M2550 Pain in unspecified joint: Secondary | ICD-10-CM | POA: Diagnosis not present

## 2020-06-07 DIAGNOSIS — S12090A Other displaced fracture of first cervical vertebra, initial encounter for closed fracture: Secondary | ICD-10-CM | POA: Diagnosis not present

## 2020-06-07 DIAGNOSIS — S72302D Unspecified fracture of shaft of left femur, subsequent encounter for closed fracture with routine healing: Secondary | ICD-10-CM | POA: Diagnosis not present

## 2020-06-07 DIAGNOSIS — S72302A Unspecified fracture of shaft of left femur, initial encounter for closed fracture: Secondary | ICD-10-CM | POA: Diagnosis not present

## 2020-06-07 DIAGNOSIS — M6281 Muscle weakness (generalized): Secondary | ICD-10-CM | POA: Diagnosis not present

## 2020-06-07 DIAGNOSIS — G43909 Migraine, unspecified, not intractable, without status migrainosus: Secondary | ICD-10-CM | POA: Diagnosis not present

## 2020-06-07 LAB — CBC WITH DIFFERENTIAL/PLATELET
Abs Immature Granulocytes: 0.02 10*3/uL (ref 0.00–0.07)
Basophils Absolute: 0 10*3/uL (ref 0.0–0.1)
Basophils Relative: 0 %
Eosinophils Absolute: 0.1 10*3/uL (ref 0.0–0.5)
Eosinophils Relative: 2 %
HCT: 32 % — ABNORMAL LOW (ref 36.0–46.0)
Hemoglobin: 10.6 g/dL — ABNORMAL LOW (ref 12.0–15.0)
Immature Granulocytes: 0 %
Lymphocytes Relative: 30 %
Lymphs Abs: 2 10*3/uL (ref 0.7–4.0)
MCH: 30.3 pg (ref 26.0–34.0)
MCHC: 33.1 g/dL (ref 30.0–36.0)
MCV: 91.4 fL (ref 80.0–100.0)
Monocytes Absolute: 0.7 10*3/uL (ref 0.1–1.0)
Monocytes Relative: 10 %
Neutro Abs: 4 10*3/uL (ref 1.7–7.7)
Neutrophils Relative %: 58 %
Platelets: 238 10*3/uL (ref 150–400)
RBC: 3.5 MIL/uL — ABNORMAL LOW (ref 3.87–5.11)
RDW: 13.9 % (ref 11.5–15.5)
WBC: 6.9 10*3/uL (ref 4.0–10.5)
nRBC: 0 % (ref 0.0–0.2)

## 2020-06-07 MED ORDER — ALPRAZOLAM 1 MG PO TABS: 1.0000 mg | ORAL_TABLET | Freq: Every evening | ORAL | 0 refills | Status: DC | PRN

## 2020-06-07 NOTE — Code Documentation (Signed)
Patient's baseline Cr is 0.9 to 1.3.  Her creatinine here was stable within that range. AKI ruled out.

## 2020-06-07 NOTE — Progress Notes (Signed)
Discharge paperwork in pt's chart for PTAR to bring to receiving facility.

## 2020-06-07 NOTE — Progress Notes (Signed)
Discharge paperwork given to PTAR. Notified Quint, pt's son, that pt was being transferred to SNF so he could meet his mother at the facility. Pt not in distress and tolerated well.

## 2020-06-07 NOTE — TOC Transition Note (Signed)
Transition of Care [facility]) - CM/SW Discharge Note   Patient Details  Name: Martavia Tye MRN: [id number] Date of Birth: 10-Jul-1935  [facility] CM/SW Contact:  Sharin Mons, RN Phone Number: 06/07/2020, 1:56 PM   Clinical Narrative:    Patient will DC to: [facility] Anticipated DC date:06/07/2020 Family notified: [name] ( son) Transport by: Corey Harold   Per MD patient ready for DC today .RN, patient, patient's family, and facility notified of DC. Discharge Summary and FL2 sent to facility. RN to call report prior to discharge [phone]).  Rm#  [phone] - P. DC packet on chart. Ambulance transport requested for patient.   RNCM will sign off for now as intervention is no longer needed. Please consult [place] again if new needs arise.    Final next level of care: [facility] ([facility]) Barriers to Discharge: No Barriers Identified   Patient Goals and CMS Choice Patient states their goals for this hospitalization and ongoing recovery are:: i want to go home and get better CMS Medicare.gov Compare Post Acute Care list provided to:: Patient    Discharge Placement                       Discharge Plan and Services                                     Social Determinants of Health (SDOH) Interventions     Readmission Risk Interventions No flowsheet data found.

## 2020-06-07 NOTE — Discharge Summary (Signed)
Physician Discharge Summary  Tracey Armstrong ION:[id number] DOB: 11-28-1934 DOA: 06/02/2020  PCP: Martinique, [name], MD  Admit date: 06/02/2020 Discharge date: 06/07/2020  Admitted From: [facility]  Disposition:  SNF [facility]   Recommendations for Outpatient Follow-up:  1. Follow up with PCP in 1-2 weeks 2. Please obtain BMP/CBC in one week your next doctors visit.  3. Aspirin for DVT prophylaxis 4. Lyrica and gabapentin prescribed 5. Norco with bowel regimen prescribed 6. Weightbearing as tolerated 7. Follow-up outpatient orthopedic in 2 weeks      Home Health: N/A  Equipment/Devices: TBD at SNF  Discharge Condition: Good  CODE STATUS: FULL Diet recommendation: Low sodium  Brief/Interim Summary: This is an 85 y.o. F with chronic pain on Lyrica, anxiety/depression on Xanax and HTN who presented after a fall.  Fell on a wooden floor and found to have distal LEFT femur fracture in the ER.  Orthopedics consulted and recommended operative repair.         PRINCIPAL HOSPITAL DIAGNOSIS: Distal left femur fracture    Discharge Diagnoses:   Closed traumatic left femur fracture status post IM nail 8/14 Patient admitted and underwent retrograde intramedullary fixation of left femur by Dr. Delfino Lovett on 8/14.  Her post-op course was complicated by expected blood loss.    Gabapentin dose has been increased.  Continue Lyrica.  Aspirin twice daily prescribed by orthopedic for DVT prophylaxis.  Outpatient follow-up in 2 weeks  Acute blood loss anemia Baseline hemoglobin 11.  Required 2 units PRBC.  Now hemoglobin is stable.     Essential hypertension No change to home regimen  GERD  Chronic low back pain secondary to compression fracture          Discharge Instructions   Allergies as of 06/07/2020      Reactions   Dihydrotachysterol Other (See Comments)   Unknown reaction   Trazodone And Nefazodone Other (See Comments)   Worsens headaches    Valium [diazepam] Other (See Comments)   headaches   Citalopram Other (See Comments)   Other reaction(s): Headache   Other Other (See Comments)   "States all antidepressants cause migraines." - the only antidepressant tolerated is xanax      Medication List    STOP taking these medications   lansoprazole 15 MG capsule Commonly known as: PREVACID     TAKE these medications   acetaminophen 650 MG CR tablet Commonly known as: TYLENOL Take 975 mg by mouth daily.   ALPRAZolam 1 MG tablet Commonly known as: XANAX Take 1 tablet (1 mg total) by mouth at bedtime as needed for up to 10 doses for anxiety. What changed:   how much to take  when to take this   aspirin 81 MG chewable tablet Commonly known as: Aspirin Childrens Chew 1 tablet (81 mg total) by mouth 2 (two) times daily.   diphenhydramine-acetaminophen 25-500 MG Tabs tablet Commonly known as: TYLENOL PM Take 1 tablet by mouth at bedtime.   docusate sodium 100 MG capsule Commonly known as: COLACE Take 1 capsule (100 mg total) by mouth 2 (two) times daily.   gabapentin 300 MG capsule Commonly known as: NEURONTIN Take 1 capsule (300 mg total) by mouth 2 (two) times daily. What changed: when to take this   HYDROcodone-acetaminophen 5-325 MG tablet Commonly known as: NORCO/VICODIN Take 1 tablet by mouth every 4 (four) hours as needed for up to 7 days for moderate pain.   Magnesium 400 MG Tabs Take 400 mg by mouth at bedtime.  Magnesium 250 MG Tabs Take 250 mg by mouth daily. 2 PO DAILY   Maxalt-MLT 10 MG disintegrating tablet Generic drug: rizatriptan DISSOLVE ONE TABLET BY MOUTH AS NEEDED HEADACHE What changed: See the new instructions.   Melatonin 10 MG Tabs Take 10 mg by mouth at bedtime.   omeprazole 20 MG tablet Commonly known as: PRILOSEC OTC Take 20 mg by mouth daily before breakfast.   oxybutynin 10 MG 24 hr tablet Commonly known as: Ditropan XL Take 1 tablet (10 mg total) by mouth at  bedtime.   POTASSIUM PO Take 1 tablet by mouth daily with lunch.   pregabalin 100 MG capsule Commonly known as: LYRICA Take 1 capsule (100 mg total) by mouth daily with lunch. What changed:   medication strength  See the new instructions.   pyridOXINE 100 MG tablet Commonly known as: VITAMIN B-6 Take 400 mg by mouth daily with lunch.   riboflavin 100 MG Tabs tablet Commonly known as: VITAMIN B-2 Take 400 mg by mouth daily with lunch.   Riboflavin 400 MG Caps Take 1 capsule (400 mg total) by mouth daily.   senna 8.6 MG Tabs tablet Commonly known as: SENOKOT Take 1 tablet (8.6 mg total) by mouth 2 (two) times daily.   tiZANidine 4 MG tablet Commonly known as: ZANAFLEX Take 1 tablet (4 mg total) by mouth 2 (two) times daily as needed. What changed: when to take this   tretinoin 0.1 % cream Commonly known as: RETIN-A APPLY TO AFFECTED AREA EVERY DAY AT BEDTIME What changed: See the new instructions.   Turmeric 500 MG Caps Take 500 mg by mouth daily with lunch.   verapamil 180 MG 24 hr capsule Commonly known as: VERELAN PM TAKE 1 CAPSULE(180 MG) BY MOUTH DAILY What changed:   how much to take  how to take this  when to take this  additional instructions   VITAMIN B-12 PO Take 1 tablet by mouth daily with lunch.   Vitamin D (Ergocalciferol) 1.25 MG (50000 UNIT) Caps capsule Commonly known as: DRISDOL 1 cap every 2 weeks. What changed:   how much to take  how to take this  when to take this  additional instructions       Contact information for follow-up providers    Swinteck, Aaron Edelman, MD. Schedule an appointment as soon as possible for a visit in 2 weeks.   Specialty: Orthopedic Surgery Why: For suture removal, For wound re-check Contact information: [address] [place] [address] [phone]        Martinique, [name], MD. Schedule an appointment as soon as possible for a visit in 1 week(s).   Specialty: Family  Medicine Contact information: [place] [address] [phone]            Contact information for after-discharge care    Destination    [facility] .   Service: Skilled Nursing Contact information: [place] [address] [phone]                 Allergies  Allergen Reactions  . Dihydrotachysterol Other (See Comments)    Unknown reaction  . Trazodone And Nefazodone Other (See Comments)    Worsens headaches  . Valium [Diazepam] Other (See Comments)    headaches  . Citalopram Other (See Comments)    Other reaction(s): Headache  . Other Other (See Comments)    "States all antidepressants cause migraines." - the only antidepressant tolerated is  xanax    Consultations:  Orthopedics   Procedures/Studies: DG Chest 1 View  Result Date: 06/02/2020 CLINICAL DATA:  Fall. EXAM: CHEST  1 VIEW COMPARISON:  None. FINDINGS: Lungs are adequately inflated and otherwise clear. Cardiomediastinal silhouette is normal. Diffuse decreased bone mineralization. No acute fracture. IMPRESSION: No acute findings. Electronically Signed   By: Marin Olp M.D.   On: 06/02/2020 13:52   DG Lumbar Spine 2-3 Views  Result Date: 06/04/2020 CLINICAL DATA:  Status post fall. EXAM: LUMBAR SPINE - 2-3 VIEW COMPARISON:  May 17, 2020 FINDINGS: There is no evidence of an acute lumbar spine fracture. A chronic compression fracture deformity is seen involving the L1 vertebral body. There is mild levoscoliosis of the upper lumbar spine. Mild endplate sclerosis is seen throughout the lumbar spine. Intervertebral disc spaces are maintained. IMPRESSION: Chronic compression fracture deformity of the L1 vertebral body. Electronically Signed   By: Virgina Norfolk M.D.   On: 06/04/2020 21:12   DG Pelvis 1-2 Views  Result Date: 06/02/2020 CLINICAL DATA:  Left leg deformity post fall. EXAM: PELVIS - 1-2 VIEW COMPARISON:  07/28/2018  FINDINGS: Mild diffuse decreased bone mineralization. Minimal degenerate change of the spine and symphysis pubis joint. Subtle symmetric degenerative change of the hips. No acute fracture or dislocation. IMPRESSION: No acute findings. Electronically Signed   By: Marin Olp M.D.   On: 06/02/2020 13:55   CT HEAD WO CONTRAST  Result Date: 06/02/2020 CLINICAL DATA:  Head trauma post fall. EXAM: CT HEAD WITHOUT CONTRAST TECHNIQUE: Contiguous axial images were obtained from the base of the skull through the vertex without intravenous contrast. COMPARISON:  MRI brain 06/01/2019 FINDINGS: Brain: Ventricles, cisterns and other CSF spaces are within normal. There is mild chronic ischemic microvascular disease. There is no intra-axial mass, mass effect or shift of midline structures. No acute hemorrhage. Evidence of known small right parietal meningioma. No acute infarction. Vascular: No hyperdense vessel or unexpected calcification. Skull: Normal. Negative for fracture or focal lesion. Sinuses/Orbits: No acute finding. Other: None. IMPRESSION: 1. No acute findings. 2. Mild chronic ischemic microvascular disease. 3. Evidence of known small right parietal meningioma stable. Electronically Signed   By: Marin Olp M.D.   On: 06/02/2020 13:58   DG C-Arm 1-60 Min  Result Date: 06/02/2020 CLINICAL DATA:  Fracture of the left femur EXAM: DG C-ARM 1-60 MIN; LEFT FEMUR 2 VIEWS COMPARISON:  None. FINDINGS: Eight intraop views were submitted ORIF with IM nail fixation of the mildly displaced midshaft femur fracture. 211.4 seconds of fluoro 15.37 mGy IMPRESSION: Intraop views of IM nail fixation midshaft femur fracture Electronically Signed   By: Prudencio Pair M.D.   On: 06/02/2020 20:44   DG FEMUR MIN 2 VIEWS LEFT  Result Date: 06/02/2020 CLINICAL DATA:  Fracture of the left femur EXAM: DG C-ARM 1-60 MIN; LEFT FEMUR 2 VIEWS COMPARISON:  None. FINDINGS: Eight intraop views were submitted ORIF with IM nail fixation of the  mildly displaced midshaft femur fracture. 211.4 seconds of fluoro 15.37 mGy IMPRESSION: Intraop views of IM nail fixation midshaft femur fracture Electronically Signed   By: Prudencio Pair M.D.   On: 06/02/2020 20:44   DG Femur Min 2 Views Left  Result Date: 06/02/2020 CLINICAL DATA:  Left hip pain post fall. EXAM: LEFT FEMUR 2 VIEWS COMPARISON:  04/04/2020 FINDINGS: Diffuse decreased bone mineralization. Examination demonstrates a displaced slightly comminuted oblique fracture of the distal femoral diaphysis with 1.5 shaft's with posterolateral displacement of the distal fragment. Mild degenerate change of  the left knee. IMPRESSION: Displaced oblique fracture of the distal femoral diaphysis. Electronically Signed   By: Marin Olp M.D.   On: 06/02/2020 13:54   DG FEMUR PORT MIN 2 VIEWS LEFT  Result Date: 06/02/2020 CLINICAL DATA:  Left femoral fracture. EXAM: LEFT FEMUR PORTABLE 2 VIEWS COMPARISON:  None. FINDINGS: A radiopaque intramedullary rod is seen throughout the length of the left femoral shaft. Multiple distal fixation screws are noted. A comminuted fracture of the mid left femoral shaft is seen with gross anatomic alignment. There is no evidence of dislocation. Diffuse soft tissue swelling is noted with multiple surgical staple seen overlying the left knee. IMPRESSION: Status post open reduction and internal fixation of a comminuted fracture of the mid left femoral shaft. Electronically Signed   By: Virgina Norfolk M.D.   On: 06/02/2020 21:42       Subjective: Patient is feeling well.  Still has moderate leg pain with movement, tolerable at rest.  She is interactive and pleasant.  No fever or respiratory symptoms.  Discharge Exam: Vitals:   06/07/20 0306 06/07/20 0806  BP: (!) 144/73 (!) 148/54  Pulse: 97 90  Resp: 16 15  Temp: 98.7 F (37.1 C) 98 F (36.7 C)  SpO2: 100% 95%   Vitals:   06/06/20 1502 06/06/20 1947 06/07/20 0306 06/07/20 0806  BP: 137/63 (!) 158/65 (!) 144/73  (!) 148/54  Pulse: 89 100 97 90  Resp: 17 16 16 15   Temp: 99.1 F (37.3 C) 98.9 F (37.2 C) 98.7 F (37.1 C) 98 F (36.7 C)  TempSrc: Oral Oral Oral Oral  SpO2: 99% 98% 100% 95%  Weight:      Height:        General: Pt is alert, awake, not in acute distress, lying in bed, interactive, daughter at bedside Cardiovascular: RRR, nl S1-S2, no murmurs appreciated.   No LE edema.   Respiratory: Normal respiratory rate and rhythm.  CTAB without rales or wheezes. Abdominal: Abdomen soft and non-tender.  No distension or HSM.   Neuro/Psych: Strength symmetric in upper extremities.  Judgment and insight appear normal.   The results of significant diagnostics from this hospitalization (including imaging, microbiology, ancillary and laboratory) are listed below for reference.     Microbiology: Recent Results (from the past 240 hour(s))  SARS Coronavirus 2 by RT PCR (hospital order, performed in [facility] hospital lab) Nasopharyngeal Nasopharyngeal Swab     Status: None   Collection Time: 06/02/20 12:06 PM   Specimen: Nasopharyngeal Swab  Result Value Ref Range Status   SARS Coronavirus 2 NEGATIVE NEGATIVE Final    Comment: (NOTE) SARS-CoV-2 target nucleic acids are NOT DETECTED.  The SARS-CoV-2 RNA is generally detectable in upper and lower respiratory specimens during the acute phase of infection. The lowest concentration of SARS-CoV-2 viral copies this assay can detect is 250 copies / mL. A negative result does not preclude SARS-CoV-2 infection and should not be used as the sole basis for treatment or other patient management decisions.  A negative result may occur with improper specimen collection / handling, submission of specimen other than nasopharyngeal swab, presence of viral mutation(s) within the areas targeted by this assay, and inadequate number of viral copies (<250 copies / mL). A negative result must be combined with clinical observations, patient history, and  epidemiological information.  Fact Sheet for Patients:   StrictlyIdeas.no  Fact Sheet for Healthcare Providers: BankingDealers.co.za  This test is not yet approved or  cleared by the [place] FDA  and has been authorized for detection and/or diagnosis of SARS-CoV-2 by FDA under an Emergency Use Authorization (EUA).  This EUA will remain in effect (meaning this test can be used) for the duration of the COVID-19 declaration under Section 564(b)(1) of the Act, 21 U.S.C. section 360bbb-3(b)(1), unless the authorization is terminated or revoked sooner.  Performed at [facility], [place] [address] [place], [place] [address]      Labs: BNP (last 3 results) No results for input(s): BNP in the last 8760 hours. Basic Metabolic Panel: Recent Labs  Lab 06/02/20 1206 06/03/20 0142 06/04/20 0121 06/05/20 0228  NA 137 137 133* 134*  K 4.7 4.4 4.6 4.3  CL 107 105 103 102  CO2 23 22 22 26   GLUCOSE 126* 170* 158* 144*  BUN 8 8 12 9   CREATININE 0.86 1.15* 1.04* 0.70  CALCIUM 8.7* 8.3* 8.1* 8.5*   Liver Function Tests: No results for input(s): AST, ALT, ALKPHOS, BILITOT, PROT, ALBUMIN in the last 168 hours. No results for input(s): LIPASE, AMYLASE in the last 168 hours. No results for input(s): AMMONIA in the last 168 hours. CBC: Recent Labs  Lab 06/02/20 1206 06/02/20 1206 06/03/20 0142 06/03/20 0142 06/04/20 0121 06/04/20 1439 06/05/20 0228 06/06/20 0247 06/07/20 0222  WBC 11.0*   < > 8.0  --  6.1  --  6.3 6.4 6.9  NEUTROABS 9.3*  --   --   --   --   --   --  3.5 4.0  HGB 11.8*   < > 8.3*   < > 6.9* 10.1* 8.7* 10.5* 10.6*  HCT 36.8   < > 25.8*   < > 21.4* 30.9* 26.1* 31.5* 32.0*  MCV 94.8   < > 93.5  --  95.1  --  89.4 91.0 91.4  PLT 269   < > 230  --  178  --  151 207 238   < > = values in this interval not displayed.   Cardiac Enzymes: No results for input(s): CKTOTAL, CKMB, CKMBINDEX, TROPONINI in the last 168  hours. BNP: Invalid input(s): POCBNP CBG: No results for input(s): GLUCAP in the last 168 hours. D-Dimer No results for input(s): DDIMER in the last 72 hours. Hgb A1c No results for input(s): HGBA1C in the last 72 hours. Lipid Profile No results for input(s): CHOL, HDL, LDLCALC, TRIG, CHOLHDL, LDLDIRECT in the last 72 hours. Thyroid function studies No results for input(s): TSH, T4TOTAL, T3FREE, THYROIDAB in the last 72 hours.  Invalid input(s): FREET3 Anemia work up No results for input(s): VITAMINB12, FOLATE, FERRITIN, TIBC, IRON, RETICCTPCT in the last 72 hours. Urinalysis    Component Value Date/Time   BILIRUBINUR neg 06/10/2017 1728   PROTEINUR neg 06/10/2017 1728   UROBILINOGEN 1.0 06/10/2017 1728   NITRITE neg 06/10/2017 1728   LEUKOCYTESUR Negative 06/10/2017 1728   Sepsis Labs Invalid input(s): PROCALCITONIN,  WBC,  LACTICIDVEN Microbiology Recent Results (from the past 240 hour(s))  SARS Coronavirus 2 by RT PCR (hospital order, performed in [facility] hospital lab) Nasopharyngeal Nasopharyngeal Swab     Status: None   Collection Time: 06/02/20 12:06 PM   Specimen: Nasopharyngeal Swab  Result Value Ref Range Status   SARS Coronavirus 2 NEGATIVE NEGATIVE Final    Comment: (NOTE) SARS-CoV-2 target nucleic acids are NOT DETECTED.  The SARS-CoV-2 RNA is generally detectable in upper and lower respiratory specimens during the acute phase of infection. The lowest concentration of SARS-CoV-2 viral copies this assay can detect is 250  copies / mL. A negative result does not preclude SARS-CoV-2 infection and should not be used as the sole basis for treatment or other patient management decisions.  A negative result may occur with improper specimen collection / handling, submission of specimen other than nasopharyngeal swab, presence of viral mutation(s) within the areas targeted by this assay, and inadequate number of viral copies (<250 copies / mL). A negative result  must be combined with clinical observations, patient history, and epidemiological information.  Fact Sheet for Patients:   StrictlyIdeas.no  Fact Sheet for Healthcare Providers: BankingDealers.co.za  This test is not yet approved or  cleared by the [place] FDA and has been authorized for detection and/or diagnosis of SARS-CoV-2 by FDA under an Emergency Use Authorization (EUA).  This EUA will remain in effect (meaning this test can be used) for the duration of the COVID-19 declaration under Section 564(b)(1) of the Act, 21 U.S.C. section 360bbb-3(b)(1), unless the authorization is terminated or revoked sooner.  Performed at [facility], [place] [address], [place], [place] [address]      Time coordinating discharge: 35 minutes The [place] controlled substances registry was reviewed for this patient prior to filling the <5 days supply controlled substances script.      SIGNED:   Edwin Dada, MD  [facility] 06/07/2020, 11:16 AM

## 2020-06-07 NOTE — Progress Notes (Signed)
Report given to nurse at receiving facility.

## 2020-06-08 ENCOUNTER — Ambulatory Visit: Payer: Medicare Other | Admitting: Family Medicine

## 2020-06-08 DIAGNOSIS — F39 Unspecified mood [affective] disorder: Secondary | ICD-10-CM | POA: Diagnosis not present

## 2020-06-08 DIAGNOSIS — G8929 Other chronic pain: Secondary | ICD-10-CM | POA: Diagnosis not present

## 2020-06-08 DIAGNOSIS — S72302A Unspecified fracture of shaft of left femur, initial encounter for closed fracture: Secondary | ICD-10-CM | POA: Diagnosis not present

## 2020-06-08 DIAGNOSIS — D62 Acute posthemorrhagic anemia: Secondary | ICD-10-CM | POA: Diagnosis not present

## 2020-06-08 DIAGNOSIS — K5901 Slow transit constipation: Secondary | ICD-10-CM | POA: Diagnosis not present

## 2020-06-08 DIAGNOSIS — I10 Essential (primary) hypertension: Secondary | ICD-10-CM | POA: Diagnosis not present

## 2020-06-08 DIAGNOSIS — D329 Benign neoplasm of meninges, unspecified: Secondary | ICD-10-CM | POA: Diagnosis not present

## 2020-06-08 DIAGNOSIS — M1388 Other specified arthritis, other site: Secondary | ICD-10-CM | POA: Diagnosis not present

## 2020-06-08 DIAGNOSIS — S12090A Other displaced fracture of first cervical vertebra, initial encounter for closed fracture: Secondary | ICD-10-CM | POA: Diagnosis not present

## 2020-06-10 ENCOUNTER — Other Ambulatory Visit: Payer: Self-pay | Admitting: Family Medicine

## 2020-06-11 ENCOUNTER — Ambulatory Visit: Payer: Medicare Other | Admitting: Family Medicine

## 2020-06-11 ENCOUNTER — Other Ambulatory Visit: Payer: Self-pay | Admitting: *Deleted

## 2020-06-11 NOTE — Patient Outreach (Signed)
Member screened for potential [facility] Care Management needs as a benefit of [place] Medicare.  Per Patient Tracey Armstrong, Mrs. Tinch is receiving skilled therapy at [facility].  Communication sent to [facility] to collaborate on anticipated dc plans and potential [facility] Care Management needs.   Marthenia Rolling, MSN-Ed, RN,BSN [place] Acute Care Coordinator [phone] [facility]) [phone]  (Toll free office)

## 2020-06-12 ENCOUNTER — Other Ambulatory Visit: Payer: Self-pay | Admitting: *Deleted

## 2020-06-12 NOTE — Patient Outreach (Signed)
[facility] Post- Acute Care Coordinator follow up.   Update received from [facility] SW. Transition plan is for member to return to [facility].  Will continue to follow for potential [facility] Care Management needs.   Marthenia Rolling, MSN-Ed, RN,BSN [place] Acute Care Coordinator [phone] [facility]) [phone]  (Toll free office)

## 2020-06-13 DIAGNOSIS — K219 Gastro-esophageal reflux disease without esophagitis: Secondary | ICD-10-CM | POA: Diagnosis not present

## 2020-06-13 DIAGNOSIS — S72302D Unspecified fracture of shaft of left femur, subsequent encounter for closed fracture with routine healing: Secondary | ICD-10-CM | POA: Diagnosis not present

## 2020-06-13 DIAGNOSIS — D62 Acute posthemorrhagic anemia: Secondary | ICD-10-CM | POA: Diagnosis not present

## 2020-06-13 DIAGNOSIS — K5901 Slow transit constipation: Secondary | ICD-10-CM | POA: Diagnosis not present

## 2020-06-13 DIAGNOSIS — S12090D Other displaced fracture of first cervical vertebra, subsequent encounter for fracture with routine healing: Secondary | ICD-10-CM | POA: Diagnosis not present

## 2020-06-13 DIAGNOSIS — G8929 Other chronic pain: Secondary | ICD-10-CM | POA: Diagnosis not present

## 2020-06-13 DIAGNOSIS — I10 Essential (primary) hypertension: Secondary | ICD-10-CM | POA: Diagnosis not present

## 2020-06-19 DIAGNOSIS — Z4789 Encounter for other orthopedic aftercare: Secondary | ICD-10-CM | POA: Diagnosis not present

## 2020-06-20 ENCOUNTER — Other Ambulatory Visit: Payer: Self-pay | Admitting: *Deleted

## 2020-06-20 NOTE — Patient Outreach (Signed)
[facility]  Post- Acute Care Coordinator follow up. Member screened for potential [facility] Care Management needs as a benefit of [place] Medicare.  Communication sent to [facility] SW to request an update.  Will continue to follow while member resides in SNF.    Marthenia Rolling, MSN-Ed, RN,BSN [place] Acute Care Coordinator [phone] [facility]) [phone]  (Toll free office)

## 2020-06-21 DIAGNOSIS — I10 Essential (primary) hypertension: Secondary | ICD-10-CM | POA: Diagnosis not present

## 2020-06-21 DIAGNOSIS — D62 Acute posthemorrhagic anemia: Secondary | ICD-10-CM | POA: Diagnosis not present

## 2020-06-21 DIAGNOSIS — S12090D Other displaced fracture of first cervical vertebra, subsequent encounter for fracture with routine healing: Secondary | ICD-10-CM | POA: Diagnosis not present

## 2020-06-21 DIAGNOSIS — S72302D Unspecified fracture of shaft of left femur, subsequent encounter for closed fracture with routine healing: Secondary | ICD-10-CM | POA: Diagnosis not present

## 2020-06-26 DIAGNOSIS — K219 Gastro-esophageal reflux disease without esophagitis: Secondary | ICD-10-CM | POA: Diagnosis not present

## 2020-06-28 DIAGNOSIS — K219 Gastro-esophageal reflux disease without esophagitis: Secondary | ICD-10-CM | POA: Diagnosis not present

## 2020-06-28 DIAGNOSIS — I10 Essential (primary) hypertension: Secondary | ICD-10-CM | POA: Diagnosis not present

## 2020-06-28 DIAGNOSIS — D62 Acute posthemorrhagic anemia: Secondary | ICD-10-CM | POA: Diagnosis not present

## 2020-06-28 DIAGNOSIS — S72302D Unspecified fracture of shaft of left femur, subsequent encounter for closed fracture with routine healing: Secondary | ICD-10-CM | POA: Diagnosis not present

## 2020-06-29 DIAGNOSIS — F411 Generalized anxiety disorder: Secondary | ICD-10-CM | POA: Diagnosis not present

## 2020-06-29 DIAGNOSIS — F329 Major depressive disorder, single episode, unspecified: Secondary | ICD-10-CM | POA: Diagnosis not present

## 2020-06-29 DIAGNOSIS — S72332S Displaced oblique fracture of shaft of left femur, sequela: Secondary | ICD-10-CM | POA: Diagnosis not present

## 2020-06-29 DIAGNOSIS — G43909 Migraine, unspecified, not intractable, without status migrainosus: Secondary | ICD-10-CM | POA: Diagnosis not present

## 2020-06-29 DIAGNOSIS — E782 Mixed hyperlipidemia: Secondary | ICD-10-CM | POA: Diagnosis not present

## 2020-06-29 DIAGNOSIS — K219 Gastro-esophageal reflux disease without esophagitis: Secondary | ICD-10-CM | POA: Diagnosis not present

## 2020-06-29 DIAGNOSIS — R262 Difficulty in walking, not elsewhere classified: Secondary | ICD-10-CM | POA: Diagnosis not present

## 2020-06-29 DIAGNOSIS — I10 Essential (primary) hypertension: Secondary | ICD-10-CM | POA: Diagnosis not present

## 2020-06-29 DIAGNOSIS — M1990 Unspecified osteoarthritis, unspecified site: Secondary | ICD-10-CM | POA: Diagnosis not present

## 2020-06-29 DIAGNOSIS — M6281 Muscle weakness (generalized): Secondary | ICD-10-CM | POA: Diagnosis not present

## 2020-06-30 ENCOUNTER — Other Ambulatory Visit: Payer: Self-pay | Admitting: Family Medicine

## 2020-06-30 DIAGNOSIS — F411 Generalized anxiety disorder: Secondary | ICD-10-CM

## 2020-07-02 DIAGNOSIS — D62 Acute posthemorrhagic anemia: Secondary | ICD-10-CM | POA: Diagnosis not present

## 2020-07-02 DIAGNOSIS — S12090D Other displaced fracture of first cervical vertebra, subsequent encounter for fracture with routine healing: Secondary | ICD-10-CM | POA: Diagnosis not present

## 2020-07-02 DIAGNOSIS — M6281 Muscle weakness (generalized): Secondary | ICD-10-CM | POA: Diagnosis not present

## 2020-07-02 DIAGNOSIS — I10 Essential (primary) hypertension: Secondary | ICD-10-CM | POA: Diagnosis not present

## 2020-07-02 DIAGNOSIS — K219 Gastro-esophageal reflux disease without esophagitis: Secondary | ICD-10-CM | POA: Diagnosis not present

## 2020-07-02 DIAGNOSIS — S72332S Displaced oblique fracture of shaft of left femur, sequela: Secondary | ICD-10-CM | POA: Diagnosis not present

## 2020-07-02 DIAGNOSIS — E782 Mixed hyperlipidemia: Secondary | ICD-10-CM | POA: Diagnosis not present

## 2020-07-02 DIAGNOSIS — G43909 Migraine, unspecified, not intractable, without status migrainosus: Secondary | ICD-10-CM | POA: Diagnosis not present

## 2020-07-02 DIAGNOSIS — S72302D Unspecified fracture of shaft of left femur, subsequent encounter for closed fracture with routine healing: Secondary | ICD-10-CM | POA: Diagnosis not present

## 2020-07-02 DIAGNOSIS — F411 Generalized anxiety disorder: Secondary | ICD-10-CM | POA: Diagnosis not present

## 2020-07-02 DIAGNOSIS — R262 Difficulty in walking, not elsewhere classified: Secondary | ICD-10-CM | POA: Diagnosis not present

## 2020-07-02 DIAGNOSIS — F329 Major depressive disorder, single episode, unspecified: Secondary | ICD-10-CM | POA: Diagnosis not present

## 2020-07-02 DIAGNOSIS — M1990 Unspecified osteoarthritis, unspecified site: Secondary | ICD-10-CM | POA: Diagnosis not present

## 2020-07-02 NOTE — Telephone Encounter (Signed)
Pt is calling in stating that she is out of Rx alprazolam (XANAX) 1 MG and gabapentin (NEURONTIN) 300 MG  Pharm:  [facility].  Pt state that when she gets better from her leg (at [facility] for rehab) she will call to make an appointment.

## 2020-07-04 DIAGNOSIS — M6281 Muscle weakness (generalized): Secondary | ICD-10-CM | POA: Diagnosis not present

## 2020-07-04 DIAGNOSIS — M25552 Pain in left hip: Secondary | ICD-10-CM | POA: Diagnosis not present

## 2020-07-04 DIAGNOSIS — R278 Other lack of coordination: Secondary | ICD-10-CM | POA: Diagnosis not present

## 2020-07-04 DIAGNOSIS — M25662 Stiffness of left knee, not elsewhere classified: Secondary | ICD-10-CM | POA: Diagnosis not present

## 2020-07-04 DIAGNOSIS — Z9181 History of falling: Secondary | ICD-10-CM | POA: Diagnosis not present

## 2020-07-04 DIAGNOSIS — R262 Difficulty in walking, not elsewhere classified: Secondary | ICD-10-CM | POA: Diagnosis not present

## 2020-07-04 DIAGNOSIS — N3941 Urge incontinence: Secondary | ICD-10-CM | POA: Diagnosis not present

## 2020-07-05 DIAGNOSIS — M6281 Muscle weakness (generalized): Secondary | ICD-10-CM | POA: Diagnosis not present

## 2020-07-05 DIAGNOSIS — M25662 Stiffness of left knee, not elsewhere classified: Secondary | ICD-10-CM | POA: Diagnosis not present

## 2020-07-05 DIAGNOSIS — N3941 Urge incontinence: Secondary | ICD-10-CM | POA: Diagnosis not present

## 2020-07-05 DIAGNOSIS — M25552 Pain in left hip: Secondary | ICD-10-CM | POA: Diagnosis not present

## 2020-07-05 DIAGNOSIS — R262 Difficulty in walking, not elsewhere classified: Secondary | ICD-10-CM | POA: Diagnosis not present

## 2020-07-05 DIAGNOSIS — R278 Other lack of coordination: Secondary | ICD-10-CM | POA: Diagnosis not present

## 2020-07-06 DIAGNOSIS — R278 Other lack of coordination: Secondary | ICD-10-CM | POA: Diagnosis not present

## 2020-07-06 DIAGNOSIS — M6281 Muscle weakness (generalized): Secondary | ICD-10-CM | POA: Diagnosis not present

## 2020-07-06 DIAGNOSIS — R262 Difficulty in walking, not elsewhere classified: Secondary | ICD-10-CM | POA: Diagnosis not present

## 2020-07-06 DIAGNOSIS — N3941 Urge incontinence: Secondary | ICD-10-CM | POA: Diagnosis not present

## 2020-07-06 DIAGNOSIS — M25552 Pain in left hip: Secondary | ICD-10-CM | POA: Diagnosis not present

## 2020-07-06 DIAGNOSIS — M25662 Stiffness of left knee, not elsewhere classified: Secondary | ICD-10-CM | POA: Diagnosis not present

## 2020-07-09 DIAGNOSIS — N3941 Urge incontinence: Secondary | ICD-10-CM | POA: Diagnosis not present

## 2020-07-09 DIAGNOSIS — Z20828 Contact with and (suspected) exposure to other viral communicable diseases: Secondary | ICD-10-CM | POA: Diagnosis not present

## 2020-07-09 DIAGNOSIS — M25662 Stiffness of left knee, not elsewhere classified: Secondary | ICD-10-CM | POA: Diagnosis not present

## 2020-07-09 DIAGNOSIS — R278 Other lack of coordination: Secondary | ICD-10-CM | POA: Diagnosis not present

## 2020-07-09 DIAGNOSIS — R262 Difficulty in walking, not elsewhere classified: Secondary | ICD-10-CM | POA: Diagnosis not present

## 2020-07-09 DIAGNOSIS — Z1159 Encounter for screening for other viral diseases: Secondary | ICD-10-CM | POA: Diagnosis not present

## 2020-07-09 DIAGNOSIS — M6281 Muscle weakness (generalized): Secondary | ICD-10-CM | POA: Diagnosis not present

## 2020-07-09 DIAGNOSIS — M25552 Pain in left hip: Secondary | ICD-10-CM | POA: Diagnosis not present

## 2020-07-10 DIAGNOSIS — M25552 Pain in left hip: Secondary | ICD-10-CM | POA: Diagnosis not present

## 2020-07-10 DIAGNOSIS — R278 Other lack of coordination: Secondary | ICD-10-CM | POA: Diagnosis not present

## 2020-07-10 DIAGNOSIS — R262 Difficulty in walking, not elsewhere classified: Secondary | ICD-10-CM | POA: Diagnosis not present

## 2020-07-10 DIAGNOSIS — N3941 Urge incontinence: Secondary | ICD-10-CM | POA: Diagnosis not present

## 2020-07-10 DIAGNOSIS — M25662 Stiffness of left knee, not elsewhere classified: Secondary | ICD-10-CM | POA: Diagnosis not present

## 2020-07-10 DIAGNOSIS — M6281 Muscle weakness (generalized): Secondary | ICD-10-CM | POA: Diagnosis not present

## 2020-07-11 ENCOUNTER — Other Ambulatory Visit: Payer: Self-pay | Admitting: *Deleted

## 2020-07-11 DIAGNOSIS — M25662 Stiffness of left knee, not elsewhere classified: Secondary | ICD-10-CM | POA: Diagnosis not present

## 2020-07-11 DIAGNOSIS — R262 Difficulty in walking, not elsewhere classified: Secondary | ICD-10-CM | POA: Diagnosis not present

## 2020-07-11 DIAGNOSIS — N3941 Urge incontinence: Secondary | ICD-10-CM | POA: Diagnosis not present

## 2020-07-11 DIAGNOSIS — M25552 Pain in left hip: Secondary | ICD-10-CM | POA: Diagnosis not present

## 2020-07-11 DIAGNOSIS — M6281 Muscle weakness (generalized): Secondary | ICD-10-CM | POA: Diagnosis not present

## 2020-07-11 DIAGNOSIS — R278 Other lack of coordination: Secondary | ICD-10-CM | POA: Diagnosis not present

## 2020-07-11 NOTE — Patient Outreach (Signed)
[place] Coordinator follow up. Member screened for potential [facility] Care Management needs as a benefit of [place] Medicare.  Verified with [facility] that member returned to [facility].  No identifiable [facility] Care Management services at this time.    Marthenia Rolling, MSN-Ed, RN,BSN [place] Acute Care Coordinator [phone] [facility]) [phone]  (Toll free office)

## 2020-07-12 DIAGNOSIS — N3941 Urge incontinence: Secondary | ICD-10-CM | POA: Diagnosis not present

## 2020-07-12 DIAGNOSIS — R262 Difficulty in walking, not elsewhere classified: Secondary | ICD-10-CM | POA: Diagnosis not present

## 2020-07-12 DIAGNOSIS — M25662 Stiffness of left knee, not elsewhere classified: Secondary | ICD-10-CM | POA: Diagnosis not present

## 2020-07-12 DIAGNOSIS — M6281 Muscle weakness (generalized): Secondary | ICD-10-CM | POA: Diagnosis not present

## 2020-07-12 DIAGNOSIS — M25552 Pain in left hip: Secondary | ICD-10-CM | POA: Diagnosis not present

## 2020-07-12 DIAGNOSIS — R278 Other lack of coordination: Secondary | ICD-10-CM | POA: Diagnosis not present

## 2020-07-13 ENCOUNTER — Telehealth: Payer: Self-pay | Admitting: Family Medicine

## 2020-07-13 DIAGNOSIS — N3941 Urge incontinence: Secondary | ICD-10-CM | POA: Diagnosis not present

## 2020-07-13 DIAGNOSIS — R262 Difficulty in walking, not elsewhere classified: Secondary | ICD-10-CM | POA: Diagnosis not present

## 2020-07-13 DIAGNOSIS — M6281 Muscle weakness (generalized): Secondary | ICD-10-CM | POA: Diagnosis not present

## 2020-07-13 DIAGNOSIS — R278 Other lack of coordination: Secondary | ICD-10-CM | POA: Diagnosis not present

## 2020-07-13 DIAGNOSIS — M25552 Pain in left hip: Secondary | ICD-10-CM | POA: Diagnosis not present

## 2020-07-13 DIAGNOSIS — M25662 Stiffness of left knee, not elsewhere classified: Secondary | ICD-10-CM | POA: Diagnosis not present

## 2020-07-13 NOTE — Progress Notes (Signed)
°  Chronic Care Management   Outreach Note  07/13/2020 Name: Tracey Armstrong MRN: [id number] DOB: 12-12-1934  Referred by: Martinique, [name], MD Reason for referral : No chief complaint on file.   An unsuccessful telephone outreach was attempted today. The patient was referred to the pharmacist for assistance with care management and care coordination.   Follow Up Plan:   Carley Perdue [employer] Scheduler

## 2020-07-14 DIAGNOSIS — M25552 Pain in left hip: Secondary | ICD-10-CM | POA: Diagnosis not present

## 2020-07-14 DIAGNOSIS — R262 Difficulty in walking, not elsewhere classified: Secondary | ICD-10-CM | POA: Diagnosis not present

## 2020-07-14 DIAGNOSIS — R278 Other lack of coordination: Secondary | ICD-10-CM | POA: Diagnosis not present

## 2020-07-14 DIAGNOSIS — N3941 Urge incontinence: Secondary | ICD-10-CM | POA: Diagnosis not present

## 2020-07-14 DIAGNOSIS — M25662 Stiffness of left knee, not elsewhere classified: Secondary | ICD-10-CM | POA: Diagnosis not present

## 2020-07-14 DIAGNOSIS — M6281 Muscle weakness (generalized): Secondary | ICD-10-CM | POA: Diagnosis not present

## 2020-07-16 DIAGNOSIS — R262 Difficulty in walking, not elsewhere classified: Secondary | ICD-10-CM | POA: Diagnosis not present

## 2020-07-16 DIAGNOSIS — M25662 Stiffness of left knee, not elsewhere classified: Secondary | ICD-10-CM | POA: Diagnosis not present

## 2020-07-16 DIAGNOSIS — Z1159 Encounter for screening for other viral diseases: Secondary | ICD-10-CM | POA: Diagnosis not present

## 2020-07-16 DIAGNOSIS — M25552 Pain in left hip: Secondary | ICD-10-CM | POA: Diagnosis not present

## 2020-07-16 DIAGNOSIS — M6281 Muscle weakness (generalized): Secondary | ICD-10-CM | POA: Diagnosis not present

## 2020-07-16 DIAGNOSIS — R278 Other lack of coordination: Secondary | ICD-10-CM | POA: Diagnosis not present

## 2020-07-16 DIAGNOSIS — N3941 Urge incontinence: Secondary | ICD-10-CM | POA: Diagnosis not present

## 2020-07-16 DIAGNOSIS — Z20828 Contact with and (suspected) exposure to other viral communicable diseases: Secondary | ICD-10-CM | POA: Diagnosis not present

## 2020-07-18 DIAGNOSIS — R262 Difficulty in walking, not elsewhere classified: Secondary | ICD-10-CM | POA: Diagnosis not present

## 2020-07-18 DIAGNOSIS — M25662 Stiffness of left knee, not elsewhere classified: Secondary | ICD-10-CM | POA: Diagnosis not present

## 2020-07-18 DIAGNOSIS — M6281 Muscle weakness (generalized): Secondary | ICD-10-CM | POA: Diagnosis not present

## 2020-07-18 DIAGNOSIS — M25552 Pain in left hip: Secondary | ICD-10-CM | POA: Diagnosis not present

## 2020-07-18 DIAGNOSIS — S72142A Displaced intertrochanteric fracture of left femur, initial encounter for closed fracture: Secondary | ICD-10-CM | POA: Insufficient documentation

## 2020-07-18 DIAGNOSIS — R278 Other lack of coordination: Secondary | ICD-10-CM | POA: Diagnosis not present

## 2020-07-18 DIAGNOSIS — N3941 Urge incontinence: Secondary | ICD-10-CM | POA: Diagnosis not present

## 2020-07-19 DIAGNOSIS — R262 Difficulty in walking, not elsewhere classified: Secondary | ICD-10-CM | POA: Diagnosis not present

## 2020-07-19 DIAGNOSIS — R278 Other lack of coordination: Secondary | ICD-10-CM | POA: Diagnosis not present

## 2020-07-19 DIAGNOSIS — M25662 Stiffness of left knee, not elsewhere classified: Secondary | ICD-10-CM | POA: Diagnosis not present

## 2020-07-19 DIAGNOSIS — M25552 Pain in left hip: Secondary | ICD-10-CM | POA: Diagnosis not present

## 2020-07-19 DIAGNOSIS — M6281 Muscle weakness (generalized): Secondary | ICD-10-CM | POA: Diagnosis not present

## 2020-07-19 DIAGNOSIS — N3941 Urge incontinence: Secondary | ICD-10-CM | POA: Diagnosis not present

## 2020-07-20 DIAGNOSIS — N3941 Urge incontinence: Secondary | ICD-10-CM | POA: Diagnosis not present

## 2020-07-20 DIAGNOSIS — M25552 Pain in left hip: Secondary | ICD-10-CM | POA: Diagnosis not present

## 2020-07-20 DIAGNOSIS — Z9181 History of falling: Secondary | ICD-10-CM | POA: Diagnosis not present

## 2020-07-20 DIAGNOSIS — M25662 Stiffness of left knee, not elsewhere classified: Secondary | ICD-10-CM | POA: Diagnosis not present

## 2020-07-20 DIAGNOSIS — R278 Other lack of coordination: Secondary | ICD-10-CM | POA: Diagnosis not present

## 2020-07-20 DIAGNOSIS — R262 Difficulty in walking, not elsewhere classified: Secondary | ICD-10-CM | POA: Diagnosis not present

## 2020-07-20 DIAGNOSIS — M6281 Muscle weakness (generalized): Secondary | ICD-10-CM | POA: Diagnosis not present

## 2020-07-23 ENCOUNTER — Telehealth: Payer: Self-pay | Admitting: Family Medicine

## 2020-07-23 DIAGNOSIS — R262 Difficulty in walking, not elsewhere classified: Secondary | ICD-10-CM | POA: Diagnosis not present

## 2020-07-23 DIAGNOSIS — Z20828 Contact with and (suspected) exposure to other viral communicable diseases: Secondary | ICD-10-CM | POA: Diagnosis not present

## 2020-07-23 DIAGNOSIS — Z4789 Encounter for other orthopedic aftercare: Secondary | ICD-10-CM | POA: Diagnosis not present

## 2020-07-23 DIAGNOSIS — R278 Other lack of coordination: Secondary | ICD-10-CM | POA: Diagnosis not present

## 2020-07-23 DIAGNOSIS — Z1159 Encounter for screening for other viral diseases: Secondary | ICD-10-CM | POA: Diagnosis not present

## 2020-07-23 DIAGNOSIS — M25552 Pain in left hip: Secondary | ICD-10-CM | POA: Diagnosis not present

## 2020-07-23 DIAGNOSIS — M25662 Stiffness of left knee, not elsewhere classified: Secondary | ICD-10-CM | POA: Diagnosis not present

## 2020-07-23 DIAGNOSIS — M6281 Muscle weakness (generalized): Secondary | ICD-10-CM | POA: Diagnosis not present

## 2020-07-23 DIAGNOSIS — N3941 Urge incontinence: Secondary | ICD-10-CM | POA: Diagnosis not present

## 2020-07-23 NOTE — Telephone Encounter (Signed)
Left message for patient to call back and schedule Medicare Annual Wellness Visit (AWV) either virtually or in office.  Last AWV 01/06/18; please schedule at anytime with [facility] Nurse Health Advisor 2.  This should be a 45 minute visit.

## 2020-07-23 NOTE — Progress Notes (Signed)
  Chronic Care Management   Outreach Note  07/23/2020 Name: Albirtha Grinage MRN: [id number] DOB: 1935/04/10  Referred by: Martinique, [name], MD Reason for referral : No chief complaint on file.   A second unsuccessful telephone outreach was attempted today. The patient was referred to pharmacist for assistance with care management and care coordination.  Follow Up Plan:   Carley Perdue [employer] Scheduler

## 2020-07-24 DIAGNOSIS — M25552 Pain in left hip: Secondary | ICD-10-CM | POA: Diagnosis not present

## 2020-07-24 DIAGNOSIS — R278 Other lack of coordination: Secondary | ICD-10-CM | POA: Diagnosis not present

## 2020-07-24 DIAGNOSIS — M6281 Muscle weakness (generalized): Secondary | ICD-10-CM | POA: Diagnosis not present

## 2020-07-24 DIAGNOSIS — N3941 Urge incontinence: Secondary | ICD-10-CM | POA: Diagnosis not present

## 2020-07-24 DIAGNOSIS — M25662 Stiffness of left knee, not elsewhere classified: Secondary | ICD-10-CM | POA: Diagnosis not present

## 2020-07-24 DIAGNOSIS — R262 Difficulty in walking, not elsewhere classified: Secondary | ICD-10-CM | POA: Diagnosis not present

## 2020-07-25 DIAGNOSIS — M6281 Muscle weakness (generalized): Secondary | ICD-10-CM | POA: Diagnosis not present

## 2020-07-25 DIAGNOSIS — R262 Difficulty in walking, not elsewhere classified: Secondary | ICD-10-CM | POA: Diagnosis not present

## 2020-07-25 DIAGNOSIS — M25662 Stiffness of left knee, not elsewhere classified: Secondary | ICD-10-CM | POA: Diagnosis not present

## 2020-07-25 DIAGNOSIS — R278 Other lack of coordination: Secondary | ICD-10-CM | POA: Diagnosis not present

## 2020-07-25 DIAGNOSIS — N3941 Urge incontinence: Secondary | ICD-10-CM | POA: Diagnosis not present

## 2020-07-25 DIAGNOSIS — M25552 Pain in left hip: Secondary | ICD-10-CM | POA: Diagnosis not present

## 2020-07-26 DIAGNOSIS — R278 Other lack of coordination: Secondary | ICD-10-CM | POA: Diagnosis not present

## 2020-07-26 DIAGNOSIS — N3941 Urge incontinence: Secondary | ICD-10-CM | POA: Diagnosis not present

## 2020-07-26 DIAGNOSIS — R262 Difficulty in walking, not elsewhere classified: Secondary | ICD-10-CM | POA: Diagnosis not present

## 2020-07-26 DIAGNOSIS — M6281 Muscle weakness (generalized): Secondary | ICD-10-CM | POA: Diagnosis not present

## 2020-07-26 DIAGNOSIS — M25552 Pain in left hip: Secondary | ICD-10-CM | POA: Diagnosis not present

## 2020-07-26 DIAGNOSIS — M25662 Stiffness of left knee, not elsewhere classified: Secondary | ICD-10-CM | POA: Diagnosis not present

## 2020-07-27 DIAGNOSIS — R278 Other lack of coordination: Secondary | ICD-10-CM | POA: Diagnosis not present

## 2020-07-27 DIAGNOSIS — M25552 Pain in left hip: Secondary | ICD-10-CM | POA: Diagnosis not present

## 2020-07-27 DIAGNOSIS — R262 Difficulty in walking, not elsewhere classified: Secondary | ICD-10-CM | POA: Diagnosis not present

## 2020-07-27 DIAGNOSIS — N3941 Urge incontinence: Secondary | ICD-10-CM | POA: Diagnosis not present

## 2020-07-27 DIAGNOSIS — M6281 Muscle weakness (generalized): Secondary | ICD-10-CM | POA: Diagnosis not present

## 2020-07-27 DIAGNOSIS — M25662 Stiffness of left knee, not elsewhere classified: Secondary | ICD-10-CM | POA: Diagnosis not present

## 2020-07-30 DIAGNOSIS — R278 Other lack of coordination: Secondary | ICD-10-CM | POA: Diagnosis not present

## 2020-07-30 DIAGNOSIS — N3941 Urge incontinence: Secondary | ICD-10-CM | POA: Diagnosis not present

## 2020-07-30 DIAGNOSIS — M6281 Muscle weakness (generalized): Secondary | ICD-10-CM | POA: Diagnosis not present

## 2020-07-30 DIAGNOSIS — M25662 Stiffness of left knee, not elsewhere classified: Secondary | ICD-10-CM | POA: Diagnosis not present

## 2020-07-30 DIAGNOSIS — Z20828 Contact with and (suspected) exposure to other viral communicable diseases: Secondary | ICD-10-CM | POA: Diagnosis not present

## 2020-07-30 DIAGNOSIS — Z1159 Encounter for screening for other viral diseases: Secondary | ICD-10-CM | POA: Diagnosis not present

## 2020-07-30 DIAGNOSIS — R262 Difficulty in walking, not elsewhere classified: Secondary | ICD-10-CM | POA: Diagnosis not present

## 2020-07-30 DIAGNOSIS — M25552 Pain in left hip: Secondary | ICD-10-CM | POA: Diagnosis not present

## 2020-07-31 ENCOUNTER — Telehealth: Payer: Self-pay | Admitting: Family Medicine

## 2020-07-31 NOTE — Progress Notes (Signed)
  Chronic Care Management   Outreach Note  07/31/2020 Name: Westlynn Fifer MRN: [id number] DOB: 14-Feb-1935  Referred by: Martinique, [name], MD Reason for referral : No chief complaint on file.   Third unsuccessful telephone outreach was attempted today. The patient was referred to the pharmacist for assistance with care management and care coordination.   Follow Up Plan:   Carley Perdue [employer] Scheduler

## 2020-08-01 DIAGNOSIS — M6281 Muscle weakness (generalized): Secondary | ICD-10-CM | POA: Diagnosis not present

## 2020-08-01 DIAGNOSIS — M25552 Pain in left hip: Secondary | ICD-10-CM | POA: Diagnosis not present

## 2020-08-01 DIAGNOSIS — M25662 Stiffness of left knee, not elsewhere classified: Secondary | ICD-10-CM | POA: Diagnosis not present

## 2020-08-01 DIAGNOSIS — R262 Difficulty in walking, not elsewhere classified: Secondary | ICD-10-CM | POA: Diagnosis not present

## 2020-08-01 DIAGNOSIS — R278 Other lack of coordination: Secondary | ICD-10-CM | POA: Diagnosis not present

## 2020-08-01 DIAGNOSIS — N3941 Urge incontinence: Secondary | ICD-10-CM | POA: Diagnosis not present

## 2020-08-02 DIAGNOSIS — N3941 Urge incontinence: Secondary | ICD-10-CM | POA: Diagnosis not present

## 2020-08-02 DIAGNOSIS — R278 Other lack of coordination: Secondary | ICD-10-CM | POA: Diagnosis not present

## 2020-08-02 DIAGNOSIS — M25552 Pain in left hip: Secondary | ICD-10-CM | POA: Diagnosis not present

## 2020-08-02 DIAGNOSIS — R262 Difficulty in walking, not elsewhere classified: Secondary | ICD-10-CM | POA: Diagnosis not present

## 2020-08-02 DIAGNOSIS — M25662 Stiffness of left knee, not elsewhere classified: Secondary | ICD-10-CM | POA: Diagnosis not present

## 2020-08-02 DIAGNOSIS — M6281 Muscle weakness (generalized): Secondary | ICD-10-CM | POA: Diagnosis not present

## 2020-08-03 DIAGNOSIS — M25552 Pain in left hip: Secondary | ICD-10-CM | POA: Diagnosis not present

## 2020-08-03 DIAGNOSIS — R278 Other lack of coordination: Secondary | ICD-10-CM | POA: Diagnosis not present

## 2020-08-03 DIAGNOSIS — N3941 Urge incontinence: Secondary | ICD-10-CM | POA: Diagnosis not present

## 2020-08-03 DIAGNOSIS — R262 Difficulty in walking, not elsewhere classified: Secondary | ICD-10-CM | POA: Diagnosis not present

## 2020-08-03 DIAGNOSIS — M6281 Muscle weakness (generalized): Secondary | ICD-10-CM | POA: Diagnosis not present

## 2020-08-03 DIAGNOSIS — M25662 Stiffness of left knee, not elsewhere classified: Secondary | ICD-10-CM | POA: Diagnosis not present

## 2020-08-06 ENCOUNTER — Telehealth: Payer: Self-pay | Admitting: Family Medicine

## 2020-08-06 DIAGNOSIS — N3941 Urge incontinence: Secondary | ICD-10-CM | POA: Diagnosis not present

## 2020-08-06 DIAGNOSIS — R278 Other lack of coordination: Secondary | ICD-10-CM | POA: Diagnosis not present

## 2020-08-06 DIAGNOSIS — Z1159 Encounter for screening for other viral diseases: Secondary | ICD-10-CM | POA: Diagnosis not present

## 2020-08-06 DIAGNOSIS — M25662 Stiffness of left knee, not elsewhere classified: Secondary | ICD-10-CM | POA: Diagnosis not present

## 2020-08-06 DIAGNOSIS — M25552 Pain in left hip: Secondary | ICD-10-CM | POA: Diagnosis not present

## 2020-08-06 DIAGNOSIS — M6281 Muscle weakness (generalized): Secondary | ICD-10-CM | POA: Diagnosis not present

## 2020-08-06 DIAGNOSIS — Z20828 Contact with and (suspected) exposure to other viral communicable diseases: Secondary | ICD-10-CM | POA: Diagnosis not present

## 2020-08-06 DIAGNOSIS — R262 Difficulty in walking, not elsewhere classified: Secondary | ICD-10-CM | POA: Diagnosis not present

## 2020-08-06 NOTE — Telephone Encounter (Signed)
Tracey Armstrong at [facility] called to report: Began walking after leg fracture but heart rate from 115-125 even after resting for 15 minutes. No diagnosis of tachycardia. BP 162/96 resting. Pt has been taking BP medication. Rosana Hoes did not exercise pt today due to heart rate. Per Rosana Hoes, Son [place] [phone] says will bring pt to appt. Rosana Hoes [phone].

## 2020-08-07 NOTE — Telephone Encounter (Signed)
Fyi - pt has an appointment for 10/25

## 2020-08-07 NOTE — Telephone Encounter (Signed)
Continue monitoring HR and BP. If she develops MS changes,chest pain, and/or dyspnea, she needs to be evaluated in the ER. Thanks, BJ

## 2020-08-07 NOTE — Telephone Encounter (Signed)
I tried contacting patient's son to schedule appt, mailbox is not set up.

## 2020-08-07 NOTE — Telephone Encounter (Signed)
I left a message for Tracey Armstrong to call the office back.

## 2020-08-08 DIAGNOSIS — R278 Other lack of coordination: Secondary | ICD-10-CM | POA: Diagnosis not present

## 2020-08-08 DIAGNOSIS — N3941 Urge incontinence: Secondary | ICD-10-CM | POA: Diagnosis not present

## 2020-08-08 DIAGNOSIS — M6281 Muscle weakness (generalized): Secondary | ICD-10-CM | POA: Diagnosis not present

## 2020-08-08 DIAGNOSIS — M25662 Stiffness of left knee, not elsewhere classified: Secondary | ICD-10-CM | POA: Diagnosis not present

## 2020-08-08 DIAGNOSIS — M25552 Pain in left hip: Secondary | ICD-10-CM | POA: Diagnosis not present

## 2020-08-08 DIAGNOSIS — R262 Difficulty in walking, not elsewhere classified: Secondary | ICD-10-CM | POA: Diagnosis not present

## 2020-08-08 NOTE — Telephone Encounter (Signed)
I spoke with Tracey Armstrong. They are monitoring pt's BP and heart rate. Patient has no chest pain or distress. Pt has been taking vitamins at lunch & then her heart rate is higher in the afternoon. Pt will bring all her medications to her visit.

## 2020-08-09 DIAGNOSIS — R278 Other lack of coordination: Secondary | ICD-10-CM | POA: Diagnosis not present

## 2020-08-09 DIAGNOSIS — M25552 Pain in left hip: Secondary | ICD-10-CM | POA: Diagnosis not present

## 2020-08-09 DIAGNOSIS — N3941 Urge incontinence: Secondary | ICD-10-CM | POA: Diagnosis not present

## 2020-08-09 DIAGNOSIS — R262 Difficulty in walking, not elsewhere classified: Secondary | ICD-10-CM | POA: Diagnosis not present

## 2020-08-09 DIAGNOSIS — M25662 Stiffness of left knee, not elsewhere classified: Secondary | ICD-10-CM | POA: Diagnosis not present

## 2020-08-09 DIAGNOSIS — M6281 Muscle weakness (generalized): Secondary | ICD-10-CM | POA: Diagnosis not present

## 2020-08-10 DIAGNOSIS — M25662 Stiffness of left knee, not elsewhere classified: Secondary | ICD-10-CM | POA: Diagnosis not present

## 2020-08-10 DIAGNOSIS — R262 Difficulty in walking, not elsewhere classified: Secondary | ICD-10-CM | POA: Diagnosis not present

## 2020-08-10 DIAGNOSIS — N3941 Urge incontinence: Secondary | ICD-10-CM | POA: Diagnosis not present

## 2020-08-10 DIAGNOSIS — R278 Other lack of coordination: Secondary | ICD-10-CM | POA: Diagnosis not present

## 2020-08-10 DIAGNOSIS — M6281 Muscle weakness (generalized): Secondary | ICD-10-CM | POA: Diagnosis not present

## 2020-08-10 DIAGNOSIS — M25552 Pain in left hip: Secondary | ICD-10-CM | POA: Diagnosis not present

## 2020-08-13 ENCOUNTER — Ambulatory Visit (INDEPENDENT_AMBULATORY_CARE_PROVIDER_SITE_OTHER): Payer: Medicare Other | Admitting: Family Medicine

## 2020-08-13 ENCOUNTER — Encounter: Payer: Self-pay | Admitting: Family Medicine

## 2020-08-13 ENCOUNTER — Other Ambulatory Visit: Payer: Self-pay

## 2020-08-13 VITALS — BP 128/80 | HR 80 | Resp 16 | Ht 62.0 in

## 2020-08-13 DIAGNOSIS — R000 Tachycardia, unspecified: Secondary | ICD-10-CM | POA: Diagnosis not present

## 2020-08-13 DIAGNOSIS — F411 Generalized anxiety disorder: Secondary | ICD-10-CM

## 2020-08-13 DIAGNOSIS — R278 Other lack of coordination: Secondary | ICD-10-CM | POA: Diagnosis not present

## 2020-08-13 DIAGNOSIS — M25552 Pain in left hip: Secondary | ICD-10-CM | POA: Diagnosis not present

## 2020-08-13 DIAGNOSIS — I10 Essential (primary) hypertension: Secondary | ICD-10-CM

## 2020-08-13 DIAGNOSIS — D329 Benign neoplasm of meninges, unspecified: Secondary | ICD-10-CM

## 2020-08-13 DIAGNOSIS — N393 Stress incontinence (female) (male): Secondary | ICD-10-CM | POA: Diagnosis not present

## 2020-08-13 DIAGNOSIS — Z20828 Contact with and (suspected) exposure to other viral communicable diseases: Secondary | ICD-10-CM | POA: Diagnosis not present

## 2020-08-13 DIAGNOSIS — D509 Iron deficiency anemia, unspecified: Secondary | ICD-10-CM | POA: Diagnosis not present

## 2020-08-13 DIAGNOSIS — Z1159 Encounter for screening for other viral diseases: Secondary | ICD-10-CM | POA: Diagnosis not present

## 2020-08-13 DIAGNOSIS — M6281 Muscle weakness (generalized): Secondary | ICD-10-CM | POA: Diagnosis not present

## 2020-08-13 DIAGNOSIS — N3941 Urge incontinence: Secondary | ICD-10-CM | POA: Diagnosis not present

## 2020-08-13 DIAGNOSIS — M25662 Stiffness of left knee, not elsewhere classified: Secondary | ICD-10-CM | POA: Diagnosis not present

## 2020-08-13 DIAGNOSIS — R262 Difficulty in walking, not elsewhere classified: Secondary | ICD-10-CM | POA: Diagnosis not present

## 2020-08-13 MED ORDER — GABAPENTIN 300 MG PO CAPS: 300.0000 mg | ORAL_CAPSULE | Freq: Two times a day (BID) | ORAL | 2 refills | Status: DC

## 2020-08-13 MED ORDER — TIZANIDINE HCL 4 MG PO TABS: 4.0000 mg | ORAL_TABLET | Freq: Two times a day (BID) | ORAL | 1 refills | Status: DC | PRN

## 2020-08-13 MED ORDER — OXYBUTYNIN CHLORIDE ER 10 MG PO TB24: 10.0000 mg | ORAL_TABLET | Freq: Every day | ORAL | 2 refills | Status: DC

## 2020-08-13 MED ORDER — ALPRAZOLAM 1 MG PO TABS: ORAL_TABLET | ORAL | 2 refills | Status: DC

## 2020-08-13 NOTE — Assessment & Plan Note (Signed)
BP is adequately controlled. Pain during PT could be contributing to elevation during therapy. Continue Verapamil 180 mg daily and monitoring BP regularly.

## 2020-08-13 NOTE — Progress Notes (Signed)
HPI: Tracey Armstrong is a 85 y.o. female, who is here today for 6 months follow up.   She was last seen on 12/19/19. HTN: There was a concerned about elevated BP and tachycardia. She thinks BP and HR were elevated because pain during PT. She is on Verapamil 180 mg daily..  Lab Results  Component Value Date   CREATININE 0.70 06/05/2020   BUN 9 06/05/2020   NA 134 (L) 06/05/2020   K 4.3 06/05/2020   CL 102 06/05/2020   CO2 26 06/05/2020   Negative for severe/frequent headache, visual changes, chest pain, dyspnea, palpitation,  focal weakness. She has checked BP when she is at rest and BP's are better: 110-140's/80's most, a few 150's/90's. HR: 80's-100, 116/min after going for a walk.  Since her last visit she underwent retrograde intramedullary fixation of left femur on 06/02/20. She fell on wooden floor on 6/38/75, complicated with left distal femoral fracture.   She is at independent living [facility]. LE edema L>R since left hip surgery,progressively getting better. Negative for decreased urine output. She is on Hydrocodone-Acetaminophen, she was taking 1/2 tab daily.  Decreased dose because it was aggravating constipation.  Next appt with surgeon 08/23/20.  Anemia: She received 2 U of PRBC's. She is on Fe Sulfate 325 mg daily. She has not noted blood in stool or melena.  Lab Results  Component Value Date   WBC 6.9 06/07/2020   HGB 10.6 (L) 06/07/2020   HCT 32.0 (L) 06/07/2020   MCV 91.4 06/07/2020   PLT 238 06/07/2020   Requesting refills on Gabapentin. Hx of lower back pain with radiculopathy. On med list is Lyrica,statys that she is planningon stopping it because Gabapentin helps more. She is also taking Zanaflex 4 mg tid prn, has helped with back pain.  Gabapentin also helps with migraine.  Urine frequency and incontinence: Oxybutynin XL 10 mg daily. Medication has helped. Negative for dysuria or gross hematuria.  Review of Systems  Constitutional:  Positive for fatigue. Negative for activity change, appetite change and fever.  HENT: Negative for mouth sores, nosebleeds and sore throat.   Respiratory: Negative for cough and wheezing.   Gastrointestinal: Negative for abdominal pain, nausea and vomiting.       Negative for changes in bowel habits.  Musculoskeletal: Positive for arthralgias, back pain and gait problem.  Neurological: Negative for syncope and facial asymmetry.  Psychiatric/Behavioral: Positive for sleep disturbance. Negative for confusion. The patient is nervous/anxious.   Rest of ROS, see pertinent positives sand negatives in HPI  Current Outpatient Medications on File Prior to Visit  Medication Sig Dispense Refill  . acetaminophen (TYLENOL) 650 MG CR tablet Take 975 mg by mouth daily.    . Cyanocobalamin (VITAMIN B-12 PO) Take 1 tablet by mouth daily with lunch.    . diphenhydramine-acetaminophen (TYLENOL PM) 25-500 MG TABS tablet Take 1 tablet by mouth at bedtime.    . docusate sodium (COLACE) 100 MG capsule Take 1 capsule (100 mg total) by mouth 2 (two) times daily. 10 capsule 0  . Magnesium 400 MG TABS Take 400 mg by mouth at bedtime.    Marland Kitchen MAXALT-MLT 10 MG disintegrating tablet DISSOLVE ONE TABLET BY MOUTH AS NEEDED HEADACHE (Patient taking differently: Take 10 mg by mouth daily as needed for migraine (Headache.). ) 12 tablet 1  . Melatonin 10 MG TABS Take 10 mg by mouth at bedtime.    Marland Kitchen omeprazole (PRILOSEC OTC) 20 MG tablet Take 20 mg by mouth  daily before breakfast.    . POTASSIUM PO Take 1 tablet by mouth daily with lunch.    . pyridOXINE (VITAMIN B-6) 100 MG tablet Take 400 mg by mouth daily with lunch.    . riboflavin (VITAMIN B-2) 100 MG TABS tablet Take 400 mg by mouth daily with lunch.    . senna (SENOKOT) 8.6 MG TABS tablet Take 1 tablet (8.6 mg total) by mouth 2 (two) times daily. 120 tablet 0  . tretinoin (RETIN-A) 0.1 % cream APPLY TO AFFECTED AREA EVERY DAY AT BEDTIME (Patient taking differently: Apply 1  application topically at bedtime as needed (brown spots). ) 45 g 2  . Turmeric 500 MG CAPS Take 500 mg by mouth daily with lunch.     . verapamil (VERELAN PM) 180 MG 24 hr capsule TAKE 1 CAPSULE(180 MG) BY MOUTH DAILY (Patient taking differently: Take 180 mg by mouth daily. ) 90 capsule 3  . Vitamin D, Ergocalciferol, (DRISDOL) 1.25 MG (50000 UNIT) CAPS capsule 1 cap every 2 weeks. (Patient taking differently: Take 50,000 Units by mouth See admin instructions. Take one capsule (50,000 units) by mouth every other Sunday night) 7 capsule 2   No current facility-administered medications on file prior to visit.   Past Medical History:  Diagnosis Date  . Anxiety   . Arthritis   . Chronic insomnia 03/30/2015  . Chronic low back pain 12/02/2016  . Depression   . GERD (gastroesophageal reflux disease)   . Hiatal hernia   . High cholesterol   . Hypertelorism   . Hypertension   . Meningioma ([place])   . Migraine    Allergies  Allergen Reactions  . Dihydrotachysterol Other (See Comments)    Unknown reaction  . Trazodone And Nefazodone Other (See Comments)    Worsens headaches  . Valium [Diazepam] Other (See Comments)    headaches  . Citalopram Other (See Comments)    Other reaction(s): Headache  . Other Other (See Comments)    "States all antidepressants cause migraines." - the only antidepressant tolerated is xanax    Social History   Socioeconomic History  . Marital status: Widowed    Spouse name: Not on file  . Number of children: 4  . Years of education: 19  . Highest education level: Not on file  Occupational History  . Occupation: Retired  Tobacco Use  . Smoking status: Never Smoker  . Smokeless tobacco: Never Used  Vaping Use  . Vaping Use: Never used  Substance and Sexual Activity  . Alcohol use: No  . Drug use: No  . Sexual activity: Not Currently  Other Topics Concern  . Not on file  Social History Narrative   Lives in [facility]      Patient drinks 1 glass of  tea daily.      Patient is right handed.   Social Determinants of Health   Financial Resource Strain:   . Difficulty of Paying Living Expenses: Not on file  Food Insecurity:   . Worried About Charity fundraiser in the Last Year: Not on file  . Ran Out of Food in the Last Year: Not on file  Transportation Needs:   . Lack of Transportation (Medical): Not on file  . Lack of Transportation (Non-Medical): Not on file  Physical Activity:   . Days of Exercise per Week: Not on file  . Minutes of Exercise per Session: Not on file  Stress:   . Feeling of Stress : Not on file  Social Connections:   . Frequency of Communication with Friends and Family: Not on file  . Frequency of Social Gatherings with Friends and Family: Not on file  . Attends Religious Services: Not on file  . Active Member of Clubs or Organizations: Not on file  . Attends Archivist Meetings: Not on file  . Marital Status: Not on file   Vitals:   08/13/20 0955  BP: 128/80  Pulse: 80  Resp: 16  SpO2: 98%   Body mass index is 28.95 kg/m.  Physical Exam Vitals and nursing note reviewed.  Constitutional:      General: She is not in acute distress.    Appearance: She is well-developed.  HENT:     Head: Normocephalic and atraumatic.     Mouth/Throat:     Mouth: Mucous membranes are moist.  Eyes:     Conjunctiva/sclera: Conjunctivae normal.  Cardiovascular:     Rate and Rhythm: Normal rate and regular rhythm.     Pulses:          Dorsalis pedis pulses are 2+ on the right side and 2+ on the left side.     Heart sounds: No murmur heard.      Comments: Trace pitting LE edema, bilateral. Pulmonary:     Effort: Pulmonary effort is normal. No respiratory distress.     Breath sounds: Normal breath sounds.  Abdominal:     Palpations: Abdomen is soft. There is no mass.     Tenderness: There is no abdominal tenderness.  Lymphadenopathy:     Cervical: No cervical adenopathy.  Skin:    General: Skin is  warm.     Findings: No erythema or rash.  Neurological:     Mental Status: She is alert and oriented to person, place, and time.     Cranial Nerves: No cranial nerve deficit.     Comments: In a wheel chair.  Psychiatric:        Mood and Affect: Mood is anxious.     Comments: Well groomed, good eye contact.   ASSESSMENT AND PLAN:  Tracey Armstrong was seen today for 6 months follow-up.  Orders Placed This Encounter  Procedures  . BASIC METABOLIC PANEL WITH GFR  . CBC    Iron deficiency anemia Continue Fe Sulfate 325 mg daily. We will plan on checking CBC next visit.  Generalized anxiety disorder Mildly worse due to recent health events. Xanax 1 mg at bedtime and 1/2 at noon as needed. We discussed some side effects.  Urine, incontinence, stress female Continue Oxybutynin XL 10 mg daily. Some side effects discussed  Essential hypertension BP is adequately controlled. Pain during PT could be contributing to elevation during therapy. Continue Verapamil 180 mg daily and monitoring BP regularly.  Meningioma ([place]) Last brain MRI 06/01/19:9 x 5 mm right parietal meningioma, unchanged in size since brain MRI 06/20/2016. Mild generalized parenchymal atrophy with chronic small vessel ischemic disease.  Sinus tachycardia We discussed possible causes, seems to be during PT/activity. Today HR in normal range. Continue monitoring HR. Instructed about warning signs.   Return in about 4 months (around 12/14/2020).   Naelani Lafrance G. Martinique, MD  [facility]. [place] office.   A few things to remember from today's visit:  Essential hypertension  Meningioma ([place]), Chronic  Generalized anxiety disorder - Plan: ALPRAZolam (XANAX) 1 MG tablet  Sinus tachycardia  Urine, incontinence, stress female - Plan: oxybutynin (DITROPAN XL) 10 MG 24 hr tablet  I sent Gabapentin to  your pharmacy. Lyrica stopped.  Continue monitoring blood pressure and heart rate.Elevations  could be caused by pain.  Xanax 1 mg 1/2 tab noon and 1 tab at bedtime. We can check labs next visit. If you need refills please call your pharmacy. Do not use My Chart to request refills or for acute issues that need immediate attention.   Please be sure medication list is accurate. If a new problem present, please set up appointment sooner than planned today.

## 2020-08-13 NOTE — Assessment & Plan Note (Addendum)
Continue Oxybutynin XL 10 mg daily. Some side effects discussed

## 2020-08-13 NOTE — Patient Instructions (Addendum)
A few things to remember from today's visit:  Essential hypertension  Meningioma ([place]), Chronic  Generalized anxiety disorder - Plan: ALPRAZolam (XANAX) 1 MG tablet  Sinus tachycardia  Urine, incontinence, stress female - Plan: oxybutynin (DITROPAN XL) 10 MG 24 hr tablet  I sent Gabapentin to your pharmacy. Lyrica stopped.  Continue monitoring blood pressure and heart rate.Elevations could be caused by pain.  Xanax 1 mg 1/2 tab noon and 1 tab at bedtime. We can check labs next visit. If you need refills please call your pharmacy. Do not use My Chart to request refills or for acute issues that need immediate attention.   Please be sure medication list is accurate. If a new problem present, please set up appointment sooner than planned today.

## 2020-08-13 NOTE — Assessment & Plan Note (Signed)
Continue Fe Sulfate 325 mg daily. We will plan on checking CBC next visit.

## 2020-08-13 NOTE — Assessment & Plan Note (Signed)
Mildly worse due to recent health events. Xanax 1 mg at bedtime and 1/2 at noon as needed. We discussed some side effects.

## 2020-08-14 DIAGNOSIS — M25662 Stiffness of left knee, not elsewhere classified: Secondary | ICD-10-CM | POA: Diagnosis not present

## 2020-08-14 DIAGNOSIS — R262 Difficulty in walking, not elsewhere classified: Secondary | ICD-10-CM | POA: Diagnosis not present

## 2020-08-14 DIAGNOSIS — R278 Other lack of coordination: Secondary | ICD-10-CM | POA: Diagnosis not present

## 2020-08-14 DIAGNOSIS — M25552 Pain in left hip: Secondary | ICD-10-CM | POA: Diagnosis not present

## 2020-08-14 DIAGNOSIS — N3941 Urge incontinence: Secondary | ICD-10-CM | POA: Diagnosis not present

## 2020-08-14 DIAGNOSIS — M6281 Muscle weakness (generalized): Secondary | ICD-10-CM | POA: Diagnosis not present

## 2020-08-15 DIAGNOSIS — R262 Difficulty in walking, not elsewhere classified: Secondary | ICD-10-CM | POA: Diagnosis not present

## 2020-08-15 DIAGNOSIS — M25552 Pain in left hip: Secondary | ICD-10-CM | POA: Diagnosis not present

## 2020-08-15 DIAGNOSIS — M6281 Muscle weakness (generalized): Secondary | ICD-10-CM | POA: Diagnosis not present

## 2020-08-15 DIAGNOSIS — M25662 Stiffness of left knee, not elsewhere classified: Secondary | ICD-10-CM | POA: Diagnosis not present

## 2020-08-15 DIAGNOSIS — R278 Other lack of coordination: Secondary | ICD-10-CM | POA: Diagnosis not present

## 2020-08-15 DIAGNOSIS — N3941 Urge incontinence: Secondary | ICD-10-CM | POA: Diagnosis not present

## 2020-08-16 DIAGNOSIS — M25552 Pain in left hip: Secondary | ICD-10-CM | POA: Diagnosis not present

## 2020-08-16 DIAGNOSIS — R262 Difficulty in walking, not elsewhere classified: Secondary | ICD-10-CM | POA: Diagnosis not present

## 2020-08-16 DIAGNOSIS — M25662 Stiffness of left knee, not elsewhere classified: Secondary | ICD-10-CM | POA: Diagnosis not present

## 2020-08-16 DIAGNOSIS — R278 Other lack of coordination: Secondary | ICD-10-CM | POA: Diagnosis not present

## 2020-08-16 DIAGNOSIS — M6281 Muscle weakness (generalized): Secondary | ICD-10-CM | POA: Diagnosis not present

## 2020-08-16 DIAGNOSIS — N3941 Urge incontinence: Secondary | ICD-10-CM | POA: Diagnosis not present

## 2020-08-17 DIAGNOSIS — R278 Other lack of coordination: Secondary | ICD-10-CM | POA: Diagnosis not present

## 2020-08-17 DIAGNOSIS — N3941 Urge incontinence: Secondary | ICD-10-CM | POA: Diagnosis not present

## 2020-08-17 DIAGNOSIS — M25552 Pain in left hip: Secondary | ICD-10-CM | POA: Diagnosis not present

## 2020-08-17 DIAGNOSIS — M25662 Stiffness of left knee, not elsewhere classified: Secondary | ICD-10-CM | POA: Diagnosis not present

## 2020-08-17 DIAGNOSIS — R262 Difficulty in walking, not elsewhere classified: Secondary | ICD-10-CM | POA: Diagnosis not present

## 2020-08-17 DIAGNOSIS — M6281 Muscle weakness (generalized): Secondary | ICD-10-CM | POA: Diagnosis not present

## 2020-08-18 ENCOUNTER — Encounter: Payer: Self-pay | Admitting: Family Medicine

## 2020-08-20 DIAGNOSIS — R278 Other lack of coordination: Secondary | ICD-10-CM | POA: Diagnosis not present

## 2020-08-20 DIAGNOSIS — Z20828 Contact with and (suspected) exposure to other viral communicable diseases: Secondary | ICD-10-CM | POA: Diagnosis not present

## 2020-08-20 DIAGNOSIS — R262 Difficulty in walking, not elsewhere classified: Secondary | ICD-10-CM | POA: Diagnosis not present

## 2020-08-20 DIAGNOSIS — Z1159 Encounter for screening for other viral diseases: Secondary | ICD-10-CM | POA: Diagnosis not present

## 2020-08-20 DIAGNOSIS — M25662 Stiffness of left knee, not elsewhere classified: Secondary | ICD-10-CM | POA: Diagnosis not present

## 2020-08-20 DIAGNOSIS — M6281 Muscle weakness (generalized): Secondary | ICD-10-CM | POA: Diagnosis not present

## 2020-08-20 DIAGNOSIS — M25552 Pain in left hip: Secondary | ICD-10-CM | POA: Diagnosis not present

## 2020-08-20 DIAGNOSIS — Z9181 History of falling: Secondary | ICD-10-CM | POA: Diagnosis not present

## 2020-08-21 DIAGNOSIS — Z9181 History of falling: Secondary | ICD-10-CM | POA: Diagnosis not present

## 2020-08-21 DIAGNOSIS — M25552 Pain in left hip: Secondary | ICD-10-CM | POA: Diagnosis not present

## 2020-08-21 DIAGNOSIS — R262 Difficulty in walking, not elsewhere classified: Secondary | ICD-10-CM | POA: Diagnosis not present

## 2020-08-21 DIAGNOSIS — M6281 Muscle weakness (generalized): Secondary | ICD-10-CM | POA: Diagnosis not present

## 2020-08-21 DIAGNOSIS — M25662 Stiffness of left knee, not elsewhere classified: Secondary | ICD-10-CM | POA: Diagnosis not present

## 2020-08-21 DIAGNOSIS — R278 Other lack of coordination: Secondary | ICD-10-CM | POA: Diagnosis not present

## 2020-08-22 DIAGNOSIS — R262 Difficulty in walking, not elsewhere classified: Secondary | ICD-10-CM | POA: Diagnosis not present

## 2020-08-22 DIAGNOSIS — M25662 Stiffness of left knee, not elsewhere classified: Secondary | ICD-10-CM | POA: Diagnosis not present

## 2020-08-22 DIAGNOSIS — R278 Other lack of coordination: Secondary | ICD-10-CM | POA: Diagnosis not present

## 2020-08-22 DIAGNOSIS — M25552 Pain in left hip: Secondary | ICD-10-CM | POA: Diagnosis not present

## 2020-08-22 DIAGNOSIS — Z9181 History of falling: Secondary | ICD-10-CM | POA: Diagnosis not present

## 2020-08-22 DIAGNOSIS — M6281 Muscle weakness (generalized): Secondary | ICD-10-CM | POA: Diagnosis not present

## 2020-08-24 DIAGNOSIS — M25662 Stiffness of left knee, not elsewhere classified: Secondary | ICD-10-CM | POA: Diagnosis not present

## 2020-08-24 DIAGNOSIS — R278 Other lack of coordination: Secondary | ICD-10-CM | POA: Diagnosis not present

## 2020-08-24 DIAGNOSIS — R262 Difficulty in walking, not elsewhere classified: Secondary | ICD-10-CM | POA: Diagnosis not present

## 2020-08-24 DIAGNOSIS — M6281 Muscle weakness (generalized): Secondary | ICD-10-CM | POA: Diagnosis not present

## 2020-08-24 DIAGNOSIS — Z9181 History of falling: Secondary | ICD-10-CM | POA: Diagnosis not present

## 2020-08-24 DIAGNOSIS — M25552 Pain in left hip: Secondary | ICD-10-CM | POA: Diagnosis not present

## 2020-08-27 ENCOUNTER — Telehealth: Payer: Self-pay

## 2020-08-27 DIAGNOSIS — R262 Difficulty in walking, not elsewhere classified: Secondary | ICD-10-CM | POA: Diagnosis not present

## 2020-08-27 DIAGNOSIS — M25662 Stiffness of left knee, not elsewhere classified: Secondary | ICD-10-CM | POA: Diagnosis not present

## 2020-08-27 DIAGNOSIS — Z9181 History of falling: Secondary | ICD-10-CM | POA: Diagnosis not present

## 2020-08-27 DIAGNOSIS — M25552 Pain in left hip: Secondary | ICD-10-CM | POA: Diagnosis not present

## 2020-08-27 DIAGNOSIS — Z1159 Encounter for screening for other viral diseases: Secondary | ICD-10-CM | POA: Diagnosis not present

## 2020-08-27 DIAGNOSIS — Z20828 Contact with and (suspected) exposure to other viral communicable diseases: Secondary | ICD-10-CM | POA: Diagnosis not present

## 2020-08-27 DIAGNOSIS — M6281 Muscle weakness (generalized): Secondary | ICD-10-CM | POA: Diagnosis not present

## 2020-08-27 DIAGNOSIS — R278 Other lack of coordination: Secondary | ICD-10-CM | POA: Diagnosis not present

## 2020-08-27 DIAGNOSIS — Z23 Encounter for immunization: Secondary | ICD-10-CM | POA: Diagnosis not present

## 2020-08-27 NOTE — Telephone Encounter (Signed)
Can you arrange a lab appt for patient? Thank you!

## 2020-08-27 NOTE — Telephone Encounter (Signed)
Labs can't be done - they only do urinalysis. Do you want her to come in for a lab appt or have them done at her f/u in February?

## 2020-08-27 NOTE — Telephone Encounter (Signed)
I prefer not to wait that long. She can come to the office when it is convenient for her. Thanks, BJ

## 2020-08-27 NOTE — Telephone Encounter (Signed)
-----   Message from [name] Martinique, MD sent at 08/18/2020  6:56 PM EDT ----- Regarding: Labs I placed future labs to follow on anemia. I wonder if labs can be done at [facility]. Thanks, BJ

## 2020-08-28 NOTE — Telephone Encounter (Signed)
LMVM for the patient to contact the office to schedule labs

## 2020-08-29 DIAGNOSIS — R278 Other lack of coordination: Secondary | ICD-10-CM | POA: Diagnosis not present

## 2020-08-29 DIAGNOSIS — M6281 Muscle weakness (generalized): Secondary | ICD-10-CM | POA: Diagnosis not present

## 2020-08-29 DIAGNOSIS — Z9181 History of falling: Secondary | ICD-10-CM | POA: Diagnosis not present

## 2020-08-29 DIAGNOSIS — R262 Difficulty in walking, not elsewhere classified: Secondary | ICD-10-CM | POA: Diagnosis not present

## 2020-08-29 DIAGNOSIS — M25662 Stiffness of left knee, not elsewhere classified: Secondary | ICD-10-CM | POA: Diagnosis not present

## 2020-08-29 DIAGNOSIS — M25552 Pain in left hip: Secondary | ICD-10-CM | POA: Diagnosis not present

## 2020-08-30 DIAGNOSIS — M6281 Muscle weakness (generalized): Secondary | ICD-10-CM | POA: Diagnosis not present

## 2020-08-30 DIAGNOSIS — M25662 Stiffness of left knee, not elsewhere classified: Secondary | ICD-10-CM | POA: Diagnosis not present

## 2020-08-30 DIAGNOSIS — M25552 Pain in left hip: Secondary | ICD-10-CM | POA: Diagnosis not present

## 2020-08-30 DIAGNOSIS — R278 Other lack of coordination: Secondary | ICD-10-CM | POA: Diagnosis not present

## 2020-08-30 DIAGNOSIS — R262 Difficulty in walking, not elsewhere classified: Secondary | ICD-10-CM | POA: Diagnosis not present

## 2020-08-30 DIAGNOSIS — Z9181 History of falling: Secondary | ICD-10-CM | POA: Diagnosis not present

## 2020-08-31 DIAGNOSIS — R278 Other lack of coordination: Secondary | ICD-10-CM | POA: Diagnosis not present

## 2020-08-31 DIAGNOSIS — M25662 Stiffness of left knee, not elsewhere classified: Secondary | ICD-10-CM | POA: Diagnosis not present

## 2020-08-31 DIAGNOSIS — M6281 Muscle weakness (generalized): Secondary | ICD-10-CM | POA: Diagnosis not present

## 2020-08-31 DIAGNOSIS — M25552 Pain in left hip: Secondary | ICD-10-CM | POA: Diagnosis not present

## 2020-08-31 DIAGNOSIS — R262 Difficulty in walking, not elsewhere classified: Secondary | ICD-10-CM | POA: Diagnosis not present

## 2020-08-31 DIAGNOSIS — Z9181 History of falling: Secondary | ICD-10-CM | POA: Diagnosis not present

## 2020-09-03 DIAGNOSIS — M7062 Trochanteric bursitis, left hip: Secondary | ICD-10-CM | POA: Diagnosis not present

## 2020-09-03 DIAGNOSIS — Z1159 Encounter for screening for other viral diseases: Secondary | ICD-10-CM | POA: Diagnosis not present

## 2020-09-03 DIAGNOSIS — S72142D Displaced intertrochanteric fracture of left femur, subsequent encounter for closed fracture with routine healing: Secondary | ICD-10-CM | POA: Diagnosis not present

## 2020-09-03 DIAGNOSIS — M7052 Other bursitis of knee, left knee: Secondary | ICD-10-CM | POA: Insufficient documentation

## 2020-09-03 DIAGNOSIS — Z20828 Contact with and (suspected) exposure to other viral communicable diseases: Secondary | ICD-10-CM | POA: Diagnosis not present

## 2020-09-04 DIAGNOSIS — R278 Other lack of coordination: Secondary | ICD-10-CM | POA: Diagnosis not present

## 2020-09-04 DIAGNOSIS — M25552 Pain in left hip: Secondary | ICD-10-CM | POA: Diagnosis not present

## 2020-09-04 DIAGNOSIS — M6281 Muscle weakness (generalized): Secondary | ICD-10-CM | POA: Diagnosis not present

## 2020-09-04 DIAGNOSIS — R262 Difficulty in walking, not elsewhere classified: Secondary | ICD-10-CM | POA: Diagnosis not present

## 2020-09-04 DIAGNOSIS — Z9181 History of falling: Secondary | ICD-10-CM | POA: Diagnosis not present

## 2020-09-04 DIAGNOSIS — M25662 Stiffness of left knee, not elsewhere classified: Secondary | ICD-10-CM | POA: Diagnosis not present

## 2020-09-05 ENCOUNTER — Other Ambulatory Visit: Payer: Self-pay

## 2020-09-05 ENCOUNTER — Other Ambulatory Visit: Payer: Medicare Other

## 2020-09-05 DIAGNOSIS — I10 Essential (primary) hypertension: Secondary | ICD-10-CM | POA: Diagnosis not present

## 2020-09-05 DIAGNOSIS — D509 Iron deficiency anemia, unspecified: Secondary | ICD-10-CM

## 2020-09-05 DIAGNOSIS — R000 Tachycardia, unspecified: Secondary | ICD-10-CM | POA: Diagnosis not present

## 2020-09-06 DIAGNOSIS — R278 Other lack of coordination: Secondary | ICD-10-CM | POA: Diagnosis not present

## 2020-09-06 DIAGNOSIS — M25662 Stiffness of left knee, not elsewhere classified: Secondary | ICD-10-CM | POA: Diagnosis not present

## 2020-09-06 DIAGNOSIS — M25552 Pain in left hip: Secondary | ICD-10-CM | POA: Diagnosis not present

## 2020-09-06 DIAGNOSIS — Z9181 History of falling: Secondary | ICD-10-CM | POA: Diagnosis not present

## 2020-09-06 DIAGNOSIS — M6281 Muscle weakness (generalized): Secondary | ICD-10-CM | POA: Diagnosis not present

## 2020-09-06 DIAGNOSIS — R262 Difficulty in walking, not elsewhere classified: Secondary | ICD-10-CM | POA: Diagnosis not present

## 2020-09-06 LAB — CBC
HCT: 38.8 % (ref 35.0–45.0)
Hemoglobin: 12.8 g/dL (ref 11.7–15.5)
MCH: 30.4 pg (ref 27.0–33.0)
MCHC: 33 g/dL (ref 32.0–36.0)
MCV: 92.2 fL (ref 80.0–100.0)
MPV: 10.2 fL (ref 7.5–12.5)
Platelets: 274 10*3/uL (ref 140–400)
RBC: 4.21 10*6/uL (ref 3.80–5.10)
RDW: 13.1 % (ref 11.0–15.0)
WBC: 5.8 10*3/uL (ref 3.8–10.8)

## 2020-09-06 LAB — BASIC METABOLIC PANEL WITH GFR
BUN/Creatinine Ratio: 20 (calc) (ref 6–22)
BUN: 20 mg/dL (ref 7–25)
CO2: 26 mmol/L (ref 20–32)
Calcium: 10.4 mg/dL (ref 8.6–10.4)
Chloride: 107 mmol/L (ref 98–110)
Creat: 1 mg/dL — ABNORMAL HIGH (ref 0.60–0.88)
GFR, Est African American: 60 mL/min/{1.73_m2} (ref >=60)
GFR, Est Non African American: 52 mL/min/{1.73_m2} — ABNORMAL LOW (ref >=60)
Glucose, Bld: 104 mg/dL — ABNORMAL HIGH (ref 65–99)
Potassium: 5.1 mmol/L (ref 3.5–5.3)
Sodium: 143 mmol/L (ref 135–146)

## 2020-09-09 DIAGNOSIS — M25552 Pain in left hip: Secondary | ICD-10-CM | POA: Diagnosis not present

## 2020-09-09 DIAGNOSIS — Z9181 History of falling: Secondary | ICD-10-CM | POA: Diagnosis not present

## 2020-09-09 DIAGNOSIS — M25662 Stiffness of left knee, not elsewhere classified: Secondary | ICD-10-CM | POA: Diagnosis not present

## 2020-09-09 DIAGNOSIS — R278 Other lack of coordination: Secondary | ICD-10-CM | POA: Diagnosis not present

## 2020-09-09 DIAGNOSIS — M6281 Muscle weakness (generalized): Secondary | ICD-10-CM | POA: Diagnosis not present

## 2020-09-09 DIAGNOSIS — R262 Difficulty in walking, not elsewhere classified: Secondary | ICD-10-CM | POA: Diagnosis not present

## 2020-09-10 DIAGNOSIS — R278 Other lack of coordination: Secondary | ICD-10-CM | POA: Diagnosis not present

## 2020-09-10 DIAGNOSIS — Z1159 Encounter for screening for other viral diseases: Secondary | ICD-10-CM | POA: Diagnosis not present

## 2020-09-10 DIAGNOSIS — Z9181 History of falling: Secondary | ICD-10-CM | POA: Diagnosis not present

## 2020-09-10 DIAGNOSIS — Z20828 Contact with and (suspected) exposure to other viral communicable diseases: Secondary | ICD-10-CM | POA: Diagnosis not present

## 2020-09-10 DIAGNOSIS — R262 Difficulty in walking, not elsewhere classified: Secondary | ICD-10-CM | POA: Diagnosis not present

## 2020-09-10 DIAGNOSIS — M25662 Stiffness of left knee, not elsewhere classified: Secondary | ICD-10-CM | POA: Diagnosis not present

## 2020-09-10 DIAGNOSIS — M25552 Pain in left hip: Secondary | ICD-10-CM | POA: Diagnosis not present

## 2020-09-10 DIAGNOSIS — M6281 Muscle weakness (generalized): Secondary | ICD-10-CM | POA: Diagnosis not present

## 2020-09-12 DIAGNOSIS — R278 Other lack of coordination: Secondary | ICD-10-CM | POA: Diagnosis not present

## 2020-09-12 DIAGNOSIS — M25552 Pain in left hip: Secondary | ICD-10-CM | POA: Diagnosis not present

## 2020-09-12 DIAGNOSIS — Z9181 History of falling: Secondary | ICD-10-CM | POA: Diagnosis not present

## 2020-09-12 DIAGNOSIS — M6281 Muscle weakness (generalized): Secondary | ICD-10-CM | POA: Diagnosis not present

## 2020-09-12 DIAGNOSIS — R262 Difficulty in walking, not elsewhere classified: Secondary | ICD-10-CM | POA: Diagnosis not present

## 2020-09-12 DIAGNOSIS — M25662 Stiffness of left knee, not elsewhere classified: Secondary | ICD-10-CM | POA: Diagnosis not present

## 2020-09-17 DIAGNOSIS — M6281 Muscle weakness (generalized): Secondary | ICD-10-CM | POA: Diagnosis not present

## 2020-09-17 DIAGNOSIS — Z20828 Contact with and (suspected) exposure to other viral communicable diseases: Secondary | ICD-10-CM | POA: Diagnosis not present

## 2020-09-17 DIAGNOSIS — Z1159 Encounter for screening for other viral diseases: Secondary | ICD-10-CM | POA: Diagnosis not present

## 2020-09-17 DIAGNOSIS — M25662 Stiffness of left knee, not elsewhere classified: Secondary | ICD-10-CM | POA: Diagnosis not present

## 2020-09-17 DIAGNOSIS — M25552 Pain in left hip: Secondary | ICD-10-CM | POA: Diagnosis not present

## 2020-09-17 DIAGNOSIS — R278 Other lack of coordination: Secondary | ICD-10-CM | POA: Diagnosis not present

## 2020-09-17 DIAGNOSIS — Z9181 History of falling: Secondary | ICD-10-CM | POA: Diagnosis not present

## 2020-09-17 DIAGNOSIS — R262 Difficulty in walking, not elsewhere classified: Secondary | ICD-10-CM | POA: Diagnosis not present

## 2020-09-18 DIAGNOSIS — M25552 Pain in left hip: Secondary | ICD-10-CM | POA: Diagnosis not present

## 2020-09-18 DIAGNOSIS — M6281 Muscle weakness (generalized): Secondary | ICD-10-CM | POA: Diagnosis not present

## 2020-09-18 DIAGNOSIS — Z9181 History of falling: Secondary | ICD-10-CM | POA: Diagnosis not present

## 2020-09-18 DIAGNOSIS — M25662 Stiffness of left knee, not elsewhere classified: Secondary | ICD-10-CM | POA: Diagnosis not present

## 2020-09-18 DIAGNOSIS — R262 Difficulty in walking, not elsewhere classified: Secondary | ICD-10-CM | POA: Diagnosis not present

## 2020-09-18 DIAGNOSIS — R278 Other lack of coordination: Secondary | ICD-10-CM | POA: Diagnosis not present

## 2020-09-20 DIAGNOSIS — Z9181 History of falling: Secondary | ICD-10-CM | POA: Diagnosis not present

## 2020-09-20 DIAGNOSIS — R278 Other lack of coordination: Secondary | ICD-10-CM | POA: Diagnosis not present

## 2020-09-20 DIAGNOSIS — M25552 Pain in left hip: Secondary | ICD-10-CM | POA: Diagnosis not present

## 2020-09-20 DIAGNOSIS — R262 Difficulty in walking, not elsewhere classified: Secondary | ICD-10-CM | POA: Diagnosis not present

## 2020-09-20 DIAGNOSIS — M6281 Muscle weakness (generalized): Secondary | ICD-10-CM | POA: Diagnosis not present

## 2020-09-20 DIAGNOSIS — M25662 Stiffness of left knee, not elsewhere classified: Secondary | ICD-10-CM | POA: Diagnosis not present

## 2020-09-21 ENCOUNTER — Other Ambulatory Visit: Payer: Self-pay | Admitting: Family Medicine

## 2020-09-24 ENCOUNTER — Telehealth: Payer: Self-pay | Admitting: Family Medicine

## 2020-09-24 ENCOUNTER — Other Ambulatory Visit: Payer: Self-pay | Admitting: Family Medicine

## 2020-09-24 DIAGNOSIS — Z1159 Encounter for screening for other viral diseases: Secondary | ICD-10-CM | POA: Diagnosis not present

## 2020-09-24 DIAGNOSIS — Z20828 Contact with and (suspected) exposure to other viral communicable diseases: Secondary | ICD-10-CM | POA: Diagnosis not present

## 2020-09-24 NOTE — Telephone Encounter (Signed)
pt would like her lab results from 09/05/2020 mailed to her home address on file .

## 2020-09-24 NOTE — Telephone Encounter (Signed)
Labs printed & placed up front to be mailed out.

## 2020-09-25 DIAGNOSIS — R262 Difficulty in walking, not elsewhere classified: Secondary | ICD-10-CM | POA: Diagnosis not present

## 2020-09-25 DIAGNOSIS — Z9181 History of falling: Secondary | ICD-10-CM | POA: Diagnosis not present

## 2020-09-25 DIAGNOSIS — M25662 Stiffness of left knee, not elsewhere classified: Secondary | ICD-10-CM | POA: Diagnosis not present

## 2020-09-25 DIAGNOSIS — M25552 Pain in left hip: Secondary | ICD-10-CM | POA: Diagnosis not present

## 2020-09-25 DIAGNOSIS — R278 Other lack of coordination: Secondary | ICD-10-CM | POA: Diagnosis not present

## 2020-09-25 DIAGNOSIS — M6281 Muscle weakness (generalized): Secondary | ICD-10-CM | POA: Diagnosis not present

## 2020-09-27 DIAGNOSIS — M6281 Muscle weakness (generalized): Secondary | ICD-10-CM | POA: Diagnosis not present

## 2020-09-27 DIAGNOSIS — M25662 Stiffness of left knee, not elsewhere classified: Secondary | ICD-10-CM | POA: Diagnosis not present

## 2020-09-27 DIAGNOSIS — M25552 Pain in left hip: Secondary | ICD-10-CM | POA: Diagnosis not present

## 2020-09-27 DIAGNOSIS — Z9181 History of falling: Secondary | ICD-10-CM | POA: Diagnosis not present

## 2020-09-27 DIAGNOSIS — R278 Other lack of coordination: Secondary | ICD-10-CM | POA: Diagnosis not present

## 2020-09-27 DIAGNOSIS — R262 Difficulty in walking, not elsewhere classified: Secondary | ICD-10-CM | POA: Diagnosis not present

## 2020-10-01 DIAGNOSIS — Z1159 Encounter for screening for other viral diseases: Secondary | ICD-10-CM | POA: Diagnosis not present

## 2020-10-01 DIAGNOSIS — S72142D Displaced intertrochanteric fracture of left femur, subsequent encounter for closed fracture with routine healing: Secondary | ICD-10-CM | POA: Diagnosis not present

## 2020-10-01 DIAGNOSIS — Z20828 Contact with and (suspected) exposure to other viral communicable diseases: Secondary | ICD-10-CM | POA: Diagnosis not present

## 2020-10-01 DIAGNOSIS — M1712 Unilateral primary osteoarthritis, left knee: Secondary | ICD-10-CM | POA: Diagnosis not present

## 2020-10-02 DIAGNOSIS — M25552 Pain in left hip: Secondary | ICD-10-CM | POA: Diagnosis not present

## 2020-10-02 DIAGNOSIS — M6281 Muscle weakness (generalized): Secondary | ICD-10-CM | POA: Diagnosis not present

## 2020-10-02 DIAGNOSIS — Z9181 History of falling: Secondary | ICD-10-CM | POA: Diagnosis not present

## 2020-10-02 DIAGNOSIS — R262 Difficulty in walking, not elsewhere classified: Secondary | ICD-10-CM | POA: Diagnosis not present

## 2020-10-02 DIAGNOSIS — M25662 Stiffness of left knee, not elsewhere classified: Secondary | ICD-10-CM | POA: Diagnosis not present

## 2020-10-02 DIAGNOSIS — R278 Other lack of coordination: Secondary | ICD-10-CM | POA: Diagnosis not present

## 2020-10-03 ENCOUNTER — Telehealth: Payer: Self-pay

## 2020-10-03 NOTE — Telephone Encounter (Signed)
After going over pt's lab results with her - she mentioned that her ortho told her she had osteoporosis and was going to leave the treatment plan up to you.

## 2020-10-04 ENCOUNTER — Other Ambulatory Visit: Payer: Self-pay | Admitting: Family Medicine

## 2020-10-04 DIAGNOSIS — E559 Vitamin D deficiency, unspecified: Secondary | ICD-10-CM

## 2020-10-04 DIAGNOSIS — S32010A Wedge compression fracture of first lumbar vertebra, initial encounter for closed fracture: Secondary | ICD-10-CM | POA: Diagnosis not present

## 2020-10-05 DIAGNOSIS — R262 Difficulty in walking, not elsewhere classified: Secondary | ICD-10-CM | POA: Diagnosis not present

## 2020-10-05 DIAGNOSIS — Z9181 History of falling: Secondary | ICD-10-CM | POA: Diagnosis not present

## 2020-10-05 DIAGNOSIS — M25662 Stiffness of left knee, not elsewhere classified: Secondary | ICD-10-CM | POA: Diagnosis not present

## 2020-10-05 DIAGNOSIS — M6281 Muscle weakness (generalized): Secondary | ICD-10-CM | POA: Diagnosis not present

## 2020-10-05 DIAGNOSIS — R278 Other lack of coordination: Secondary | ICD-10-CM | POA: Diagnosis not present

## 2020-10-05 DIAGNOSIS — M25552 Pain in left hip: Secondary | ICD-10-CM | POA: Diagnosis not present

## 2020-10-08 ENCOUNTER — Telehealth: Payer: Self-pay | Admitting: Family Medicine

## 2020-10-08 DIAGNOSIS — R262 Difficulty in walking, not elsewhere classified: Secondary | ICD-10-CM | POA: Diagnosis not present

## 2020-10-08 DIAGNOSIS — R278 Other lack of coordination: Secondary | ICD-10-CM | POA: Diagnosis not present

## 2020-10-08 DIAGNOSIS — Z9181 History of falling: Secondary | ICD-10-CM | POA: Diagnosis not present

## 2020-10-08 DIAGNOSIS — Z1159 Encounter for screening for other viral diseases: Secondary | ICD-10-CM | POA: Diagnosis not present

## 2020-10-08 DIAGNOSIS — Z20828 Contact with and (suspected) exposure to other viral communicable diseases: Secondary | ICD-10-CM | POA: Diagnosis not present

## 2020-10-08 DIAGNOSIS — M25552 Pain in left hip: Secondary | ICD-10-CM | POA: Diagnosis not present

## 2020-10-08 DIAGNOSIS — M25662 Stiffness of left knee, not elsewhere classified: Secondary | ICD-10-CM | POA: Diagnosis not present

## 2020-10-08 DIAGNOSIS — M6281 Muscle weakness (generalized): Secondary | ICD-10-CM | POA: Diagnosis not present

## 2020-10-08 NOTE — Telephone Encounter (Signed)
LMVM for the patient to contact the office to schedule a telephone visit to discuss different treatment options.

## 2020-10-08 NOTE — Telephone Encounter (Signed)
Can you help pt set up a telephone visit to discuss different treatment options?

## 2020-10-08 NOTE — Telephone Encounter (Signed)
Appointment scheduled.

## 2020-10-09 DIAGNOSIS — R262 Difficulty in walking, not elsewhere classified: Secondary | ICD-10-CM | POA: Diagnosis not present

## 2020-10-09 DIAGNOSIS — Z9181 History of falling: Secondary | ICD-10-CM | POA: Diagnosis not present

## 2020-10-09 DIAGNOSIS — M25552 Pain in left hip: Secondary | ICD-10-CM | POA: Diagnosis not present

## 2020-10-09 DIAGNOSIS — M6281 Muscle weakness (generalized): Secondary | ICD-10-CM | POA: Diagnosis not present

## 2020-10-09 DIAGNOSIS — R278 Other lack of coordination: Secondary | ICD-10-CM | POA: Diagnosis not present

## 2020-10-09 DIAGNOSIS — M25662 Stiffness of left knee, not elsewhere classified: Secondary | ICD-10-CM | POA: Diagnosis not present

## 2020-10-10 DIAGNOSIS — Z9181 History of falling: Secondary | ICD-10-CM | POA: Diagnosis not present

## 2020-10-10 DIAGNOSIS — R262 Difficulty in walking, not elsewhere classified: Secondary | ICD-10-CM | POA: Diagnosis not present

## 2020-10-10 DIAGNOSIS — M25552 Pain in left hip: Secondary | ICD-10-CM | POA: Diagnosis not present

## 2020-10-10 DIAGNOSIS — R278 Other lack of coordination: Secondary | ICD-10-CM | POA: Diagnosis not present

## 2020-10-10 DIAGNOSIS — M25662 Stiffness of left knee, not elsewhere classified: Secondary | ICD-10-CM | POA: Diagnosis not present

## 2020-10-10 DIAGNOSIS — M6281 Muscle weakness (generalized): Secondary | ICD-10-CM | POA: Diagnosis not present

## 2020-10-15 DIAGNOSIS — R262 Difficulty in walking, not elsewhere classified: Secondary | ICD-10-CM | POA: Diagnosis not present

## 2020-10-15 DIAGNOSIS — M25552 Pain in left hip: Secondary | ICD-10-CM | POA: Diagnosis not present

## 2020-10-15 DIAGNOSIS — M6281 Muscle weakness (generalized): Secondary | ICD-10-CM | POA: Diagnosis not present

## 2020-10-15 DIAGNOSIS — R278 Other lack of coordination: Secondary | ICD-10-CM | POA: Diagnosis not present

## 2020-10-15 DIAGNOSIS — Z20828 Contact with and (suspected) exposure to other viral communicable diseases: Secondary | ICD-10-CM | POA: Diagnosis not present

## 2020-10-15 DIAGNOSIS — Z9181 History of falling: Secondary | ICD-10-CM | POA: Diagnosis not present

## 2020-10-15 DIAGNOSIS — M25662 Stiffness of left knee, not elsewhere classified: Secondary | ICD-10-CM | POA: Diagnosis not present

## 2020-10-15 DIAGNOSIS — Z1159 Encounter for screening for other viral diseases: Secondary | ICD-10-CM | POA: Diagnosis not present

## 2020-10-16 DIAGNOSIS — Z9181 History of falling: Secondary | ICD-10-CM | POA: Diagnosis not present

## 2020-10-16 DIAGNOSIS — M25662 Stiffness of left knee, not elsewhere classified: Secondary | ICD-10-CM | POA: Diagnosis not present

## 2020-10-16 DIAGNOSIS — M6281 Muscle weakness (generalized): Secondary | ICD-10-CM | POA: Diagnosis not present

## 2020-10-16 DIAGNOSIS — R262 Difficulty in walking, not elsewhere classified: Secondary | ICD-10-CM | POA: Diagnosis not present

## 2020-10-16 DIAGNOSIS — M25552 Pain in left hip: Secondary | ICD-10-CM | POA: Diagnosis not present

## 2020-10-16 DIAGNOSIS — R278 Other lack of coordination: Secondary | ICD-10-CM | POA: Diagnosis not present

## 2020-10-18 DIAGNOSIS — M25662 Stiffness of left knee, not elsewhere classified: Secondary | ICD-10-CM | POA: Diagnosis not present

## 2020-10-18 DIAGNOSIS — M6281 Muscle weakness (generalized): Secondary | ICD-10-CM | POA: Diagnosis not present

## 2020-10-18 DIAGNOSIS — M25552 Pain in left hip: Secondary | ICD-10-CM | POA: Diagnosis not present

## 2020-10-18 DIAGNOSIS — R262 Difficulty in walking, not elsewhere classified: Secondary | ICD-10-CM | POA: Diagnosis not present

## 2020-10-18 DIAGNOSIS — Z9181 History of falling: Secondary | ICD-10-CM | POA: Diagnosis not present

## 2020-10-18 DIAGNOSIS — R278 Other lack of coordination: Secondary | ICD-10-CM | POA: Diagnosis not present

## 2020-10-22 DIAGNOSIS — M7062 Trochanteric bursitis, left hip: Secondary | ICD-10-CM | POA: Diagnosis not present

## 2020-10-22 DIAGNOSIS — Z1159 Encounter for screening for other viral diseases: Secondary | ICD-10-CM | POA: Diagnosis not present

## 2020-10-22 DIAGNOSIS — S72142D Displaced intertrochanteric fracture of left femur, subsequent encounter for closed fracture with routine healing: Secondary | ICD-10-CM | POA: Diagnosis not present

## 2020-10-22 DIAGNOSIS — Z20828 Contact with and (suspected) exposure to other viral communicable diseases: Secondary | ICD-10-CM | POA: Diagnosis not present

## 2020-10-24 DIAGNOSIS — R278 Other lack of coordination: Secondary | ICD-10-CM | POA: Diagnosis not present

## 2020-10-24 DIAGNOSIS — M25662 Stiffness of left knee, not elsewhere classified: Secondary | ICD-10-CM | POA: Diagnosis not present

## 2020-10-24 DIAGNOSIS — Z9181 History of falling: Secondary | ICD-10-CM | POA: Diagnosis not present

## 2020-10-24 DIAGNOSIS — R262 Difficulty in walking, not elsewhere classified: Secondary | ICD-10-CM | POA: Diagnosis not present

## 2020-10-24 DIAGNOSIS — M6281 Muscle weakness (generalized): Secondary | ICD-10-CM | POA: Diagnosis not present

## 2020-10-24 DIAGNOSIS — M25552 Pain in left hip: Secondary | ICD-10-CM | POA: Diagnosis not present

## 2020-10-26 DIAGNOSIS — M6281 Muscle weakness (generalized): Secondary | ICD-10-CM | POA: Diagnosis not present

## 2020-10-26 DIAGNOSIS — Z9181 History of falling: Secondary | ICD-10-CM | POA: Diagnosis not present

## 2020-10-26 DIAGNOSIS — R262 Difficulty in walking, not elsewhere classified: Secondary | ICD-10-CM | POA: Diagnosis not present

## 2020-10-26 DIAGNOSIS — R278 Other lack of coordination: Secondary | ICD-10-CM | POA: Diagnosis not present

## 2020-10-26 DIAGNOSIS — M25662 Stiffness of left knee, not elsewhere classified: Secondary | ICD-10-CM | POA: Diagnosis not present

## 2020-10-26 DIAGNOSIS — M25552 Pain in left hip: Secondary | ICD-10-CM | POA: Diagnosis not present

## 2020-10-29 DIAGNOSIS — Z20828 Contact with and (suspected) exposure to other viral communicable diseases: Secondary | ICD-10-CM | POA: Diagnosis not present

## 2020-10-29 DIAGNOSIS — Z1159 Encounter for screening for other viral diseases: Secondary | ICD-10-CM | POA: Diagnosis not present

## 2020-10-30 DIAGNOSIS — M25552 Pain in left hip: Secondary | ICD-10-CM | POA: Diagnosis not present

## 2020-10-30 DIAGNOSIS — Z9181 History of falling: Secondary | ICD-10-CM | POA: Diagnosis not present

## 2020-10-30 DIAGNOSIS — R262 Difficulty in walking, not elsewhere classified: Secondary | ICD-10-CM | POA: Diagnosis not present

## 2020-10-30 DIAGNOSIS — M25662 Stiffness of left knee, not elsewhere classified: Secondary | ICD-10-CM | POA: Diagnosis not present

## 2020-10-30 DIAGNOSIS — R278 Other lack of coordination: Secondary | ICD-10-CM | POA: Diagnosis not present

## 2020-10-30 DIAGNOSIS — M6281 Muscle weakness (generalized): Secondary | ICD-10-CM | POA: Diagnosis not present

## 2020-11-01 DIAGNOSIS — R262 Difficulty in walking, not elsewhere classified: Secondary | ICD-10-CM | POA: Diagnosis not present

## 2020-11-01 DIAGNOSIS — M6281 Muscle weakness (generalized): Secondary | ICD-10-CM | POA: Diagnosis not present

## 2020-11-01 DIAGNOSIS — Z9181 History of falling: Secondary | ICD-10-CM | POA: Diagnosis not present

## 2020-11-01 DIAGNOSIS — M25552 Pain in left hip: Secondary | ICD-10-CM | POA: Diagnosis not present

## 2020-11-01 DIAGNOSIS — M25662 Stiffness of left knee, not elsewhere classified: Secondary | ICD-10-CM | POA: Diagnosis not present

## 2020-11-01 DIAGNOSIS — R278 Other lack of coordination: Secondary | ICD-10-CM | POA: Diagnosis not present

## 2020-11-05 DIAGNOSIS — M6281 Muscle weakness (generalized): Secondary | ICD-10-CM | POA: Diagnosis not present

## 2020-11-05 DIAGNOSIS — Z20828 Contact with and (suspected) exposure to other viral communicable diseases: Secondary | ICD-10-CM | POA: Diagnosis not present

## 2020-11-05 DIAGNOSIS — Z9181 History of falling: Secondary | ICD-10-CM | POA: Diagnosis not present

## 2020-11-05 DIAGNOSIS — M25662 Stiffness of left knee, not elsewhere classified: Secondary | ICD-10-CM | POA: Diagnosis not present

## 2020-11-05 DIAGNOSIS — Z1159 Encounter for screening for other viral diseases: Secondary | ICD-10-CM | POA: Diagnosis not present

## 2020-11-05 DIAGNOSIS — M25552 Pain in left hip: Secondary | ICD-10-CM | POA: Diagnosis not present

## 2020-11-05 DIAGNOSIS — R262 Difficulty in walking, not elsewhere classified: Secondary | ICD-10-CM | POA: Diagnosis not present

## 2020-11-05 DIAGNOSIS — R278 Other lack of coordination: Secondary | ICD-10-CM | POA: Diagnosis not present

## 2020-11-07 DIAGNOSIS — R278 Other lack of coordination: Secondary | ICD-10-CM | POA: Diagnosis not present

## 2020-11-07 DIAGNOSIS — Z9181 History of falling: Secondary | ICD-10-CM | POA: Diagnosis not present

## 2020-11-07 DIAGNOSIS — R262 Difficulty in walking, not elsewhere classified: Secondary | ICD-10-CM | POA: Diagnosis not present

## 2020-11-07 DIAGNOSIS — M25662 Stiffness of left knee, not elsewhere classified: Secondary | ICD-10-CM | POA: Diagnosis not present

## 2020-11-07 DIAGNOSIS — G894 Chronic pain syndrome: Secondary | ICD-10-CM | POA: Diagnosis not present

## 2020-11-07 DIAGNOSIS — M6281 Muscle weakness (generalized): Secondary | ICD-10-CM | POA: Diagnosis not present

## 2020-11-07 DIAGNOSIS — M25552 Pain in left hip: Secondary | ICD-10-CM | POA: Diagnosis not present

## 2020-11-08 ENCOUNTER — Other Ambulatory Visit: Payer: Self-pay | Admitting: Family Medicine

## 2020-11-08 DIAGNOSIS — M25662 Stiffness of left knee, not elsewhere classified: Secondary | ICD-10-CM | POA: Diagnosis not present

## 2020-11-08 DIAGNOSIS — M25552 Pain in left hip: Secondary | ICD-10-CM | POA: Diagnosis not present

## 2020-11-08 DIAGNOSIS — M6281 Muscle weakness (generalized): Secondary | ICD-10-CM | POA: Diagnosis not present

## 2020-11-08 DIAGNOSIS — R262 Difficulty in walking, not elsewhere classified: Secondary | ICD-10-CM | POA: Diagnosis not present

## 2020-11-08 DIAGNOSIS — R278 Other lack of coordination: Secondary | ICD-10-CM | POA: Diagnosis not present

## 2020-11-08 DIAGNOSIS — Z9181 History of falling: Secondary | ICD-10-CM | POA: Diagnosis not present

## 2020-11-08 NOTE — Telephone Encounter (Signed)
Last OV 08/13/20 Last refill 09/21/20 #30/1 Next OV 12/12/20

## 2020-11-12 DIAGNOSIS — Z1159 Encounter for screening for other viral diseases: Secondary | ICD-10-CM | POA: Diagnosis not present

## 2020-11-12 DIAGNOSIS — Z9181 History of falling: Secondary | ICD-10-CM | POA: Diagnosis not present

## 2020-11-12 DIAGNOSIS — R262 Difficulty in walking, not elsewhere classified: Secondary | ICD-10-CM | POA: Diagnosis not present

## 2020-11-12 DIAGNOSIS — R278 Other lack of coordination: Secondary | ICD-10-CM | POA: Diagnosis not present

## 2020-11-12 DIAGNOSIS — Z20828 Contact with and (suspected) exposure to other viral communicable diseases: Secondary | ICD-10-CM | POA: Diagnosis not present

## 2020-11-12 DIAGNOSIS — M6281 Muscle weakness (generalized): Secondary | ICD-10-CM | POA: Diagnosis not present

## 2020-11-12 DIAGNOSIS — M25552 Pain in left hip: Secondary | ICD-10-CM | POA: Diagnosis not present

## 2020-11-12 DIAGNOSIS — M25662 Stiffness of left knee, not elsewhere classified: Secondary | ICD-10-CM | POA: Diagnosis not present

## 2020-11-14 DIAGNOSIS — R262 Difficulty in walking, not elsewhere classified: Secondary | ICD-10-CM | POA: Diagnosis not present

## 2020-11-14 DIAGNOSIS — M25552 Pain in left hip: Secondary | ICD-10-CM | POA: Diagnosis not present

## 2020-11-14 DIAGNOSIS — R278 Other lack of coordination: Secondary | ICD-10-CM | POA: Diagnosis not present

## 2020-11-14 DIAGNOSIS — Z9181 History of falling: Secondary | ICD-10-CM | POA: Diagnosis not present

## 2020-11-14 DIAGNOSIS — M6281 Muscle weakness (generalized): Secondary | ICD-10-CM | POA: Diagnosis not present

## 2020-11-14 DIAGNOSIS — M25662 Stiffness of left knee, not elsewhere classified: Secondary | ICD-10-CM | POA: Diagnosis not present

## 2020-11-19 DIAGNOSIS — M25662 Stiffness of left knee, not elsewhere classified: Secondary | ICD-10-CM | POA: Diagnosis not present

## 2020-11-19 DIAGNOSIS — Z1159 Encounter for screening for other viral diseases: Secondary | ICD-10-CM | POA: Diagnosis not present

## 2020-11-19 DIAGNOSIS — M6281 Muscle weakness (generalized): Secondary | ICD-10-CM | POA: Diagnosis not present

## 2020-11-19 DIAGNOSIS — Z9181 History of falling: Secondary | ICD-10-CM | POA: Diagnosis not present

## 2020-11-19 DIAGNOSIS — R278 Other lack of coordination: Secondary | ICD-10-CM | POA: Diagnosis not present

## 2020-11-19 DIAGNOSIS — R262 Difficulty in walking, not elsewhere classified: Secondary | ICD-10-CM | POA: Diagnosis not present

## 2020-11-19 DIAGNOSIS — M25552 Pain in left hip: Secondary | ICD-10-CM | POA: Diagnosis not present

## 2020-11-19 DIAGNOSIS — Z20828 Contact with and (suspected) exposure to other viral communicable diseases: Secondary | ICD-10-CM | POA: Diagnosis not present

## 2020-11-21 DIAGNOSIS — M25662 Stiffness of left knee, not elsewhere classified: Secondary | ICD-10-CM | POA: Diagnosis not present

## 2020-11-21 DIAGNOSIS — M25552 Pain in left hip: Secondary | ICD-10-CM | POA: Diagnosis not present

## 2020-11-21 DIAGNOSIS — Z9181 History of falling: Secondary | ICD-10-CM | POA: Diagnosis not present

## 2020-11-21 DIAGNOSIS — M6281 Muscle weakness (generalized): Secondary | ICD-10-CM | POA: Diagnosis not present

## 2020-11-21 DIAGNOSIS — R262 Difficulty in walking, not elsewhere classified: Secondary | ICD-10-CM | POA: Diagnosis not present

## 2020-11-21 DIAGNOSIS — R278 Other lack of coordination: Secondary | ICD-10-CM | POA: Diagnosis not present

## 2020-11-22 DIAGNOSIS — M25552 Pain in left hip: Secondary | ICD-10-CM | POA: Diagnosis not present

## 2020-11-22 DIAGNOSIS — Z9181 History of falling: Secondary | ICD-10-CM | POA: Diagnosis not present

## 2020-11-22 DIAGNOSIS — R262 Difficulty in walking, not elsewhere classified: Secondary | ICD-10-CM | POA: Diagnosis not present

## 2020-11-22 DIAGNOSIS — M25662 Stiffness of left knee, not elsewhere classified: Secondary | ICD-10-CM | POA: Diagnosis not present

## 2020-11-22 DIAGNOSIS — R278 Other lack of coordination: Secondary | ICD-10-CM | POA: Diagnosis not present

## 2020-11-22 DIAGNOSIS — M6281 Muscle weakness (generalized): Secondary | ICD-10-CM | POA: Diagnosis not present

## 2020-11-26 DIAGNOSIS — M6281 Muscle weakness (generalized): Secondary | ICD-10-CM | POA: Diagnosis not present

## 2020-11-26 DIAGNOSIS — Z9181 History of falling: Secondary | ICD-10-CM | POA: Diagnosis not present

## 2020-11-26 DIAGNOSIS — M25552 Pain in left hip: Secondary | ICD-10-CM | POA: Diagnosis not present

## 2020-11-26 DIAGNOSIS — Z1159 Encounter for screening for other viral diseases: Secondary | ICD-10-CM | POA: Diagnosis not present

## 2020-11-26 DIAGNOSIS — Z20828 Contact with and (suspected) exposure to other viral communicable diseases: Secondary | ICD-10-CM | POA: Diagnosis not present

## 2020-11-26 DIAGNOSIS — R262 Difficulty in walking, not elsewhere classified: Secondary | ICD-10-CM | POA: Diagnosis not present

## 2020-11-26 DIAGNOSIS — M25662 Stiffness of left knee, not elsewhere classified: Secondary | ICD-10-CM | POA: Diagnosis not present

## 2020-11-26 DIAGNOSIS — R278 Other lack of coordination: Secondary | ICD-10-CM | POA: Diagnosis not present

## 2020-11-28 ENCOUNTER — Other Ambulatory Visit: Payer: Self-pay | Admitting: Family Medicine

## 2020-11-28 DIAGNOSIS — Z9181 History of falling: Secondary | ICD-10-CM | POA: Diagnosis not present

## 2020-11-28 DIAGNOSIS — M6281 Muscle weakness (generalized): Secondary | ICD-10-CM | POA: Diagnosis not present

## 2020-11-28 DIAGNOSIS — R262 Difficulty in walking, not elsewhere classified: Secondary | ICD-10-CM | POA: Diagnosis not present

## 2020-11-28 DIAGNOSIS — M25662 Stiffness of left knee, not elsewhere classified: Secondary | ICD-10-CM | POA: Diagnosis not present

## 2020-11-28 DIAGNOSIS — F411 Generalized anxiety disorder: Secondary | ICD-10-CM

## 2020-11-28 DIAGNOSIS — R278 Other lack of coordination: Secondary | ICD-10-CM | POA: Diagnosis not present

## 2020-11-28 DIAGNOSIS — M25552 Pain in left hip: Secondary | ICD-10-CM | POA: Diagnosis not present

## 2020-11-28 NOTE — Telephone Encounter (Signed)
Last filled 10/17/20

## 2020-11-29 DIAGNOSIS — M25552 Pain in left hip: Secondary | ICD-10-CM | POA: Diagnosis not present

## 2020-11-29 DIAGNOSIS — R278 Other lack of coordination: Secondary | ICD-10-CM | POA: Diagnosis not present

## 2020-11-29 DIAGNOSIS — M25662 Stiffness of left knee, not elsewhere classified: Secondary | ICD-10-CM | POA: Diagnosis not present

## 2020-11-29 DIAGNOSIS — M6281 Muscle weakness (generalized): Secondary | ICD-10-CM | POA: Diagnosis not present

## 2020-11-29 DIAGNOSIS — R262 Difficulty in walking, not elsewhere classified: Secondary | ICD-10-CM | POA: Diagnosis not present

## 2020-11-29 DIAGNOSIS — Z9181 History of falling: Secondary | ICD-10-CM | POA: Diagnosis not present

## 2020-12-03 DIAGNOSIS — Z1159 Encounter for screening for other viral diseases: Secondary | ICD-10-CM | POA: Diagnosis not present

## 2020-12-03 DIAGNOSIS — M25662 Stiffness of left knee, not elsewhere classified: Secondary | ICD-10-CM | POA: Diagnosis not present

## 2020-12-03 DIAGNOSIS — R278 Other lack of coordination: Secondary | ICD-10-CM | POA: Diagnosis not present

## 2020-12-03 DIAGNOSIS — Z20828 Contact with and (suspected) exposure to other viral communicable diseases: Secondary | ICD-10-CM | POA: Diagnosis not present

## 2020-12-03 DIAGNOSIS — R262 Difficulty in walking, not elsewhere classified: Secondary | ICD-10-CM | POA: Diagnosis not present

## 2020-12-03 DIAGNOSIS — Z9181 History of falling: Secondary | ICD-10-CM | POA: Diagnosis not present

## 2020-12-03 DIAGNOSIS — M6281 Muscle weakness (generalized): Secondary | ICD-10-CM | POA: Diagnosis not present

## 2020-12-03 DIAGNOSIS — M25552 Pain in left hip: Secondary | ICD-10-CM | POA: Diagnosis not present

## 2020-12-05 DIAGNOSIS — M47816 Spondylosis without myelopathy or radiculopathy, lumbar region: Secondary | ICD-10-CM | POA: Diagnosis not present

## 2020-12-05 DIAGNOSIS — G894 Chronic pain syndrome: Secondary | ICD-10-CM | POA: Diagnosis not present

## 2020-12-07 DIAGNOSIS — R262 Difficulty in walking, not elsewhere classified: Secondary | ICD-10-CM | POA: Diagnosis not present

## 2020-12-07 DIAGNOSIS — M25552 Pain in left hip: Secondary | ICD-10-CM | POA: Diagnosis not present

## 2020-12-07 DIAGNOSIS — M6281 Muscle weakness (generalized): Secondary | ICD-10-CM | POA: Diagnosis not present

## 2020-12-07 DIAGNOSIS — R278 Other lack of coordination: Secondary | ICD-10-CM | POA: Diagnosis not present

## 2020-12-07 DIAGNOSIS — Z9181 History of falling: Secondary | ICD-10-CM | POA: Diagnosis not present

## 2020-12-07 DIAGNOSIS — M25662 Stiffness of left knee, not elsewhere classified: Secondary | ICD-10-CM | POA: Diagnosis not present

## 2020-12-10 DIAGNOSIS — Z1159 Encounter for screening for other viral diseases: Secondary | ICD-10-CM | POA: Diagnosis not present

## 2020-12-10 DIAGNOSIS — Z20828 Contact with and (suspected) exposure to other viral communicable diseases: Secondary | ICD-10-CM | POA: Diagnosis not present

## 2020-12-12 ENCOUNTER — Other Ambulatory Visit: Payer: Self-pay

## 2020-12-12 ENCOUNTER — Encounter: Payer: Self-pay | Admitting: Family Medicine

## 2020-12-12 ENCOUNTER — Ambulatory Visit (INDEPENDENT_AMBULATORY_CARE_PROVIDER_SITE_OTHER): Payer: Medicare Other | Admitting: Family Medicine

## 2020-12-12 VITALS — BP 126/80 | HR 92 | Resp 16 | Ht 62.0 in | Wt 137.0 lb

## 2020-12-12 DIAGNOSIS — R739 Hyperglycemia, unspecified: Secondary | ICD-10-CM

## 2020-12-12 DIAGNOSIS — I10 Essential (primary) hypertension: Secondary | ICD-10-CM | POA: Diagnosis not present

## 2020-12-12 DIAGNOSIS — R682 Dry mouth, unspecified: Secondary | ICD-10-CM

## 2020-12-12 DIAGNOSIS — M48061 Spinal stenosis, lumbar region without neurogenic claudication: Secondary | ICD-10-CM

## 2020-12-12 DIAGNOSIS — E559 Vitamin D deficiency, unspecified: Secondary | ICD-10-CM

## 2020-12-12 DIAGNOSIS — N393 Stress incontinence (female) (male): Secondary | ICD-10-CM

## 2020-12-12 DIAGNOSIS — F411 Generalized anxiety disorder: Secondary | ICD-10-CM | POA: Diagnosis not present

## 2020-12-12 DIAGNOSIS — R944 Abnormal results of kidney function studies: Secondary | ICD-10-CM

## 2020-12-12 LAB — POCT GLYCOSYLATED HEMOGLOBIN (HGB A1C): Hemoglobin A1C: 5.5 % (ref 4.0–5.6)

## 2020-12-12 LAB — COMPREHENSIVE METABOLIC PANEL
ALT: 12 U/L (ref 0–35)
AST: 18 U/L (ref 0–37)
Albumin: 3.8 g/dL (ref 3.5–5.2)
Alkaline Phosphatase: 64 U/L (ref 39–117)
BUN: 15 mg/dL (ref 6–23)
CO2: 26 mEq/L (ref 19–32)
Calcium: 9.7 mg/dL (ref 8.4–10.5)
Chloride: 102 mEq/L (ref 96–112)
Creatinine, Ser: 1.07 mg/dL (ref 0.40–1.20)
GFR: 47.88 mL/min — ABNORMAL LOW (ref >60.00)
Glucose, Bld: 85 mg/dL (ref 70–99)
Potassium: 4.3 mEq/L (ref 3.5–5.1)
Sodium: 136 mEq/L (ref 135–145)
Total Bilirubin: 0.3 mg/dL (ref 0.2–1.2)
Total Protein: 6.8 g/dL (ref 6.0–8.3)

## 2020-12-12 LAB — MICROALBUMIN / CREATININE URINE RATIO
Creatinine,U: 102.7 mg/dL
Microalb Creat Ratio: 1.5 mg/g (ref 0.0–30.0)
Microalb, Ur: 1.5 mg/dL (ref 0.0–1.9)

## 2020-12-12 LAB — VITAMIN D 25 HYDROXY (VIT D DEFICIENCY, FRACTURES): VITD: 52.03 ng/mL (ref 30.00–100.00)

## 2020-12-12 MED ORDER — GABAPENTIN 300 MG PO CAPS: 300.0000 mg | ORAL_CAPSULE | Freq: Two times a day (BID) | ORAL | 2 refills | Status: DC

## 2020-12-12 MED ORDER — MAGIC MOUTHWASH: 5.0000 mL | Freq: Every day | ORAL | 2 refills | Status: DC | PRN

## 2020-12-12 NOTE — Patient Instructions (Addendum)
A few things to remember from today's visit:   Abnormal renal function test - Plan: Microalbumin / creatinine urine ratio, Comprehensive metabolic panel  Hyperglycemia  Essential hypertension  Generalized anxiety disorder  Vitamin D deficiency, unspecified - Plan: VITAMIN D 25 Hydroxy (Vit-D Deficiency, Fractures)  Dry mouth - Plan: magic mouthwash SOLN  If you need refills please call your pharmacy. Do not use My Chart to request refills or for acute issues that need immediate attention.   Gabapentin increased to 2 caps daily. Fall prevention, most of your meds increase risk.  Please be sure medication list is accurate. If a new problem present, please set up appointment sooner than planned today.

## 2020-12-12 NOTE — Progress Notes (Signed)
HPI: Tracey Armstrong is a 86 y.o. female with hx of chronic pain,anxiety,OA, chronic headaches,and HTN here today with her aid for 5-6 months follow up.   She was last seen on 08/13/20. Since her last visit she had a fall complicated by left hip fracture.  Hospitalized from 06/02/20 to 06/07/20. She underwent intramedullary retrograde femoral nailing on 06/02/20.  Discharged to a SNF. She completed PT. She is using a walker.  Requesting refills on Gabapentin, she is on 300 mg at bedtime. She is not longer on Lyrica, she would like to have Gabapentin dose increased from 300 mg daily to 600 mg. Medication has helped with radicular lumbar pain. She sees Dr Nelva Bush for pain management. She is on Hydrocodone-Acetaminophen 5-325 mg bid prn. Planning on having "shots" in her back.  Anxiety and insomnia: She is on Xanax 1 mg 1.5 tab daily prn. Tolerating medication well, still helping.  Urinary frequency and incontinence: Nocturia x 1. Oxybutynin XL 10 mg daily is helping. No side effects reported.  Dry mouth: No oral lesions. Requesting Rx for magic mouth wash, recommended by family member. Negative for sore throat and dysphagia.  Constipation: Mg 400 mg at night. Bowel movements q 1-2 days.  Lab Results  Component Value Date   WBC 5.8 09/05/2020   HGB 12.8 09/05/2020   HCT 38.8 09/05/2020   MCV 92.2 09/05/2020   PLT 274 09/05/2020   Lab Results  Component Value Date   ALT 45 (H) 04/11/2020   AST 34 04/11/2020   ALKPHOS 54 04/11/2020   BILITOT 0.5 04/11/2020   HTN: Currently she is on Verapamil 180 mg daily. Negative for severe/frequent headache, visual changes, chest pain, dyspnea, palpitation, focal weakness. Stable LE edema. Renal function has been mildly abnormal. Mildly elevated glucose 112-122, no hx of diabetes.  Negative for gross hematuria,foam in urine,or decreased urine output.  Lab Results  Component Value Date   CREATININE 1.00 (H) 09/05/2020   BUN  20 09/05/2020   NA 143 09/05/2020   K 5.1 09/05/2020   CL 107 09/05/2020   CO2 26 09/05/2020   Vit D deficiency: She is on Ergocalciferol 50,000 U every 2 weeks. Last 25 OH vit D 54 in 12/2019.  Review of Systems  Constitutional: Positive for fatigue. Negative for activity change, appetite change and fever.  HENT: Negative for mouth sores and nosebleeds.   Eyes: Negative for redness and visual disturbance.  Respiratory: Negative for cough and wheezing.   Gastrointestinal: Negative for abdominal pain, nausea and vomiting.       Negative for changes in bowel habits.  Musculoskeletal: Positive for arthralgias, back pain and gait problem.  Neurological: Negative for syncope, facial asymmetry and weakness.  Psychiatric/Behavioral: Positive for sleep disturbance. Negative for confusion and hallucinations. The patient is nervous/anxious.   Rest of ROS, see pertinent positives sand negatives in HPI  Current Outpatient Medications on File Prior to Visit  Medication Sig Dispense Refill  . acetaminophen (TYLENOL) 650 MG CR tablet Take 975 mg by mouth daily.    Marland Kitchen ALPRAZolam (XANAX) 1 MG tablet TAKE 1/2 TABLET BY MOUTH AT NOON AND 1 TABLET AT BEDTIME 45 tablet 2  . Cyanocobalamin (VITAMIN B-12 PO) Take 1 tablet by mouth daily with lunch.    . diphenhydramine-acetaminophen (TYLENOL PM) 25-500 MG TABS tablet Take 1 tablet by mouth at bedtime.    . docusate sodium (COLACE) 100 MG capsule Take 1 capsule (100 mg total) by mouth 2 (two) times daily. 10 capsule  0  . HYDROcodone-acetaminophen (NORCO/VICODIN) 5-325 MG tablet Take 1 tablet by mouth 2 (two) times daily as needed.    . Magnesium 400 MG TABS Take 400 mg by mouth at bedtime.    Marland Kitchen MAXALT-MLT 10 MG disintegrating tablet DISSOLVE ONE TABLET BY MOUTH AS NEEDED HEADACHE (Patient taking differently: Take 10 mg by mouth daily as needed for migraine (Headache.).) 12 tablet 1  . Melatonin 10 MG TABS Take 10 mg by mouth at bedtime.    Marland Kitchen omeprazole  (PRILOSEC OTC) 20 MG tablet Take 20 mg by mouth daily before breakfast.    . oxybutynin (DITROPAN XL) 10 MG 24 hr tablet Take 1 tablet (10 mg total) by mouth at bedtime. 90 tablet 2  . POTASSIUM PO Take 1 tablet by mouth daily with lunch.    . pyridOXINE (VITAMIN B-6) 100 MG tablet Take 400 mg by mouth daily with lunch.    . riboflavin (VITAMIN B-2) 100 MG TABS tablet Take 400 mg by mouth daily with lunch.    . senna (SENOKOT) 8.6 MG TABS tablet Take 1 tablet (8.6 mg total) by mouth 2 (two) times daily. 120 tablet 0  . tiZANidine (ZANAFLEX) 4 MG tablet TAKE 1 TABLET(4 MG) BY MOUTH TWICE DAILY AS NEEDED 60 tablet 1  . tretinoin (RETIN-A) 0.1 % cream APPLY TO AFFECTED AREA EVERY DAY AT BEDTIME (Patient taking differently: Apply 1 application topically at bedtime as needed (brown spots).) 45 g 2  . Turmeric 500 MG CAPS Take 500 mg by mouth daily with lunch.     . verapamil (VERELAN PM) 180 MG 24 hr capsule TAKE 1 CAPSULE(180 MG) BY MOUTH DAILY (Patient taking differently: Take 180 mg by mouth daily.) 90 capsule 3  . Vitamin D, Ergocalciferol, (DRISDOL) 1.25 MG (50000 UNIT) CAPS capsule TAKE 1 CAPSULE BY MOUTH EVERY 2 WEEKS 7 capsule 2   No current facility-administered medications on file prior to visit.   Past Medical History:  Diagnosis Date  . Anxiety   . Arthritis   . Chronic insomnia 03/30/2015  . Chronic low back pain 12/02/2016  . Depression   . GERD (gastroesophageal reflux disease)   . Hiatal hernia   . High cholesterol   . Hypertelorism   . Hypertension   . Meningioma ([place])   . Migraine    Allergies  Allergen Reactions  . Dihydrotachysterol Other (See Comments)    Unknown reaction  . Trazodone And Nefazodone Other (See Comments)    Worsens headaches  . Valium [Diazepam] Other (See Comments)    headaches  . Citalopram Other (See Comments)    Other reaction(s): Headache  . Other Other (See Comments)    "States all antidepressants cause migraines." - the only antidepressant  tolerated is xanax    Social History   Socioeconomic History  . Marital status: Widowed    Spouse name: Not on file  . Number of children: 4  . Years of education: 66  . Highest education level: Not on file  Occupational History  . Occupation: Retired  Tobacco Use  . Smoking status: Never Smoker  . Smokeless tobacco: Never Used  Vaping Use  . Vaping Use: Never used  Substance and Sexual Activity  . Alcohol use: No  . Drug use: No  . Sexual activity: Not Currently  Other Topics Concern  . Not on file  Social History Narrative   Lives in [place]      Patient drinks 1 glass of tea daily.  Patient is right handed.   Social Determinants of Health   Financial Resource Strain: Not on file  Food Insecurity: Not on file  Transportation Needs: Not on file  Physical Activity: Not on file  Stress: Not on file  Social Connections: Not on file   Vitals:   12/12/20 0948  BP: 126/80  Pulse: 92  Resp: 16  SpO2: 98%   Body mass index is 25.06 kg/m.  Physical Exam Vitals and nursing note reviewed.  Constitutional:      General: She is not in acute distress.    Appearance: She is well-developed.  HENT:     Head: Normocephalic and atraumatic.     Mouth/Throat:     Mouth: Oropharynx is clear and moist. Mucous membranes are dry.  Eyes:     Conjunctiva/sclera: Conjunctivae normal.     Pupils: Pupils are equal, round, and reactive to light.  Cardiovascular:     Rate and Rhythm: Normal rate and regular rhythm.     Pulses:          Dorsalis pedis pulses are 2+ on the right side and 2+ on the left side.     Heart sounds: No murmur heard.   Pulmonary:     Effort: Pulmonary effort is normal. No respiratory distress.     Breath sounds: Normal breath sounds.  Abdominal:     Palpations: Abdomen is soft. There is no hepatomegaly or mass.     Tenderness: There is no abdominal tenderness.  Musculoskeletal:        General: No edema.     Comments: Antalgic gait,  assisted by a cane.  Lymphadenopathy:     Cervical: No cervical adenopathy.  Skin:    General: Skin is warm.     Findings: No erythema or rash.  Neurological:     Mental Status: She is alert and oriented to person, place, and time.     Cranial Nerves: No cranial nerve deficit.     Deep Tendon Reflexes: Strength normal.  Psychiatric:        Mood and Affect: Mood and affect normal.     Comments: Well groomed, good eye contact.   ASSESSMENT AND PLAN:  Ms. Mertice Uffelman was seen today for 5-6 months follow-up.  Orders Placed This Encounter  Procedures  . VITAMIN D 25 Hydroxy (Vit-D Deficiency, Fractures)  . Microalbumin / creatinine urine ratio  . Comprehensive metabolic panel  . POC HgB A1c   Lab Results  Component Value Date   HGBA1C 5.5 12/12/2020   Lab Results  Component Value Date   CREATININE 1.07 12/12/2020   BUN 15 12/12/2020   NA 136 12/12/2020   K 4.3 12/12/2020   CL 102 12/12/2020   CO2 26 12/12/2020   Lab Results  Component Value Date   ALT 12 12/12/2020   AST 18 12/12/2020   ALKPHOS 64 12/12/2020   BILITOT 0.3 12/12/2020   Lab Results  Component Value Date   MICROALBUR 1.5 12/12/2020   MICROALBUR <0.7 08/17/2019   Abnormal renal function test Adequate hydration and low salt diet. Avoid NSAID's. Further recommendations according to lab results.  Hyperglycemia HgA1C in normal range. Healthy life style for primary prevention recommended.  Essential hypertension BP adequately controlled. Continue Verapamil 180 mg daily and low salt diet.  Generalized anxiety disorder Stable. Continue Xanax 1 mg at bedtime and extra 1/2 tab during the day if needed. Some side effects discussed as well as the risk of interaction with opioid meds.  Vitamin D deficiency, unspecified Continue Ergocalciferol 50,000 U q 2 weeks. Further recommendations according to 25 OH vit D result.  Dry mouth Some of her meds could aggravate  Magic mouth wash Rx given as  requested. Recommend following with her dentist as needed.  -     magic mouthwash SOLN; Take 5 mLs by mouth daily as needed.  Bilateral stenosis of lateral recess of lumbar spine Pain worse since she stop Lyrica. She thinks Gabapentin works better. Gabapentin dose increased from 300 mg daily to bid.   -     gabapentin (NEURONTIN) 300 MG capsule; Take 1 capsule (300 mg total) by mouth 2 (two) times daily.  Urine, incontinence, stress female Oxybutynin XL 10 mg daily is helping. Some side effects discussed.  Return in about 4 months (around 04/11/2021) for Follow up.   Tracey G. Martinique, MD  [facility]. [place] office.  A few things to remember from today's visit:   Abnormal renal function test - Plan: Microalbumin / creatinine urine ratio, Comprehensive metabolic panel  Hyperglycemia  Essential hypertension  Generalized anxiety disorder  Vitamin D deficiency, unspecified - Plan: VITAMIN D 25 Hydroxy (Vit-D Deficiency, Fractures)  Dry mouth - Plan: magic mouthwash SOLN  If you need refills please call your pharmacy. Do not use My Chart to request refills or for acute issues that need immediate attention.   Gabapentin increased to 2 caps daily. Fall prevention, most of your meds increase risk.  Please be sure medication list is accurate. If a new problem present, please set up appointment sooner than planned today.

## 2020-12-17 DIAGNOSIS — Z1159 Encounter for screening for other viral diseases: Secondary | ICD-10-CM | POA: Diagnosis not present

## 2020-12-17 DIAGNOSIS — Z20828 Contact with and (suspected) exposure to other viral communicable diseases: Secondary | ICD-10-CM | POA: Diagnosis not present

## 2020-12-19 DIAGNOSIS — M6281 Muscle weakness (generalized): Secondary | ICD-10-CM | POA: Diagnosis not present

## 2020-12-19 DIAGNOSIS — Z9181 History of falling: Secondary | ICD-10-CM | POA: Diagnosis not present

## 2020-12-19 DIAGNOSIS — R278 Other lack of coordination: Secondary | ICD-10-CM | POA: Diagnosis not present

## 2020-12-20 DIAGNOSIS — M47816 Spondylosis without myelopathy or radiculopathy, lumbar region: Secondary | ICD-10-CM | POA: Diagnosis not present

## 2020-12-24 DIAGNOSIS — Z1159 Encounter for screening for other viral diseases: Secondary | ICD-10-CM | POA: Diagnosis not present

## 2020-12-24 DIAGNOSIS — Z20828 Contact with and (suspected) exposure to other viral communicable diseases: Secondary | ICD-10-CM | POA: Diagnosis not present

## 2020-12-26 ENCOUNTER — Other Ambulatory Visit: Payer: Self-pay

## 2020-12-26 DIAGNOSIS — M6281 Muscle weakness (generalized): Secondary | ICD-10-CM | POA: Diagnosis not present

## 2020-12-26 DIAGNOSIS — R278 Other lack of coordination: Secondary | ICD-10-CM | POA: Diagnosis not present

## 2020-12-26 DIAGNOSIS — Z9181 History of falling: Secondary | ICD-10-CM | POA: Diagnosis not present

## 2020-12-26 MED ORDER — BIOTENE DRY MOUTH MT LIQD: OROMUCOSAL | 0 refills | Status: AC

## 2020-12-31 DIAGNOSIS — Z1159 Encounter for screening for other viral diseases: Secondary | ICD-10-CM | POA: Diagnosis not present

## 2020-12-31 DIAGNOSIS — Z20828 Contact with and (suspected) exposure to other viral communicable diseases: Secondary | ICD-10-CM | POA: Diagnosis not present

## 2021-01-01 DIAGNOSIS — Z9181 History of falling: Secondary | ICD-10-CM | POA: Diagnosis not present

## 2021-01-01 DIAGNOSIS — R278 Other lack of coordination: Secondary | ICD-10-CM | POA: Diagnosis not present

## 2021-01-01 DIAGNOSIS — M6281 Muscle weakness (generalized): Secondary | ICD-10-CM | POA: Diagnosis not present

## 2021-01-07 DIAGNOSIS — Z20828 Contact with and (suspected) exposure to other viral communicable diseases: Secondary | ICD-10-CM | POA: Diagnosis not present

## 2021-01-07 DIAGNOSIS — Z1159 Encounter for screening for other viral diseases: Secondary | ICD-10-CM | POA: Diagnosis not present

## 2021-01-14 DIAGNOSIS — Z1159 Encounter for screening for other viral diseases: Secondary | ICD-10-CM | POA: Diagnosis not present

## 2021-01-14 DIAGNOSIS — Z20828 Contact with and (suspected) exposure to other viral communicable diseases: Secondary | ICD-10-CM | POA: Diagnosis not present

## 2021-01-16 DIAGNOSIS — R262 Difficulty in walking, not elsewhere classified: Secondary | ICD-10-CM | POA: Diagnosis not present

## 2021-01-16 DIAGNOSIS — M6281 Muscle weakness (generalized): Secondary | ICD-10-CM | POA: Diagnosis not present

## 2021-01-16 DIAGNOSIS — M25562 Pain in left knee: Secondary | ICD-10-CM | POA: Diagnosis not present

## 2021-01-16 DIAGNOSIS — M5459 Other low back pain: Secondary | ICD-10-CM | POA: Diagnosis not present

## 2021-01-21 DIAGNOSIS — R262 Difficulty in walking, not elsewhere classified: Secondary | ICD-10-CM | POA: Diagnosis not present

## 2021-01-21 DIAGNOSIS — M7062 Trochanteric bursitis, left hip: Secondary | ICD-10-CM | POA: Diagnosis not present

## 2021-01-21 DIAGNOSIS — M25562 Pain in left knee: Secondary | ICD-10-CM | POA: Diagnosis not present

## 2021-01-21 DIAGNOSIS — Z20828 Contact with and (suspected) exposure to other viral communicable diseases: Secondary | ICD-10-CM | POA: Diagnosis not present

## 2021-01-21 DIAGNOSIS — M6281 Muscle weakness (generalized): Secondary | ICD-10-CM | POA: Diagnosis not present

## 2021-01-21 DIAGNOSIS — S72142D Displaced intertrochanteric fracture of left femur, subsequent encounter for closed fracture with routine healing: Secondary | ICD-10-CM | POA: Diagnosis not present

## 2021-01-21 DIAGNOSIS — Z1159 Encounter for screening for other viral diseases: Secondary | ICD-10-CM | POA: Diagnosis not present

## 2021-01-21 DIAGNOSIS — M5459 Other low back pain: Secondary | ICD-10-CM | POA: Diagnosis not present

## 2021-01-22 ENCOUNTER — Telehealth: Payer: Self-pay | Admitting: Physical Medicine and Rehabilitation

## 2021-01-22 NOTE — Telephone Encounter (Signed)
Left L5 TF 10/12/2019. Ok to repeat if helped, same problem/side, and no new injury?

## 2021-01-22 NOTE — Telephone Encounter (Signed)
Pt called stating she would like to get a back injection appt set up  [phone]

## 2021-01-22 NOTE — Telephone Encounter (Signed)
It appears that she has been seeing Dr. Suella Broad at [facility] and if she is established there he can do the exact same types of procedure that we can.  You can see what story she has in general.  It looks like she also had an intertrochanteric hip fracture.

## 2021-01-23 DIAGNOSIS — R262 Difficulty in walking, not elsewhere classified: Secondary | ICD-10-CM | POA: Diagnosis not present

## 2021-01-23 DIAGNOSIS — M25562 Pain in left knee: Secondary | ICD-10-CM | POA: Diagnosis not present

## 2021-01-23 DIAGNOSIS — M5459 Other low back pain: Secondary | ICD-10-CM | POA: Diagnosis not present

## 2021-01-23 DIAGNOSIS — M6281 Muscle weakness (generalized): Secondary | ICD-10-CM | POA: Diagnosis not present

## 2021-01-23 NOTE — Telephone Encounter (Signed)
Patient states that she is having back pain. She states that Dr. Nelva Bush did an injection on 12/20/20 and she has had severe pain since. She does not want any more injections from Dr. Nelva Bush. I have scheduled her for an OV and asked that she get records from Dr. Nelva Bush.

## 2021-01-24 DIAGNOSIS — M25562 Pain in left knee: Secondary | ICD-10-CM | POA: Diagnosis not present

## 2021-01-24 DIAGNOSIS — M6281 Muscle weakness (generalized): Secondary | ICD-10-CM | POA: Diagnosis not present

## 2021-01-24 DIAGNOSIS — M5459 Other low back pain: Secondary | ICD-10-CM | POA: Diagnosis not present

## 2021-01-24 DIAGNOSIS — R262 Difficulty in walking, not elsewhere classified: Secondary | ICD-10-CM | POA: Diagnosis not present

## 2021-01-28 ENCOUNTER — Encounter: Payer: Self-pay | Admitting: Physician Assistant

## 2021-01-28 ENCOUNTER — Ambulatory Visit (INDEPENDENT_AMBULATORY_CARE_PROVIDER_SITE_OTHER): Payer: Medicare Other | Admitting: Physician Assistant

## 2021-01-28 DIAGNOSIS — M1712 Unilateral primary osteoarthritis, left knee: Secondary | ICD-10-CM

## 2021-01-28 DIAGNOSIS — M1711 Unilateral primary osteoarthritis, right knee: Secondary | ICD-10-CM

## 2021-01-28 MED ORDER — LIDOCAINE HCL 1 % IJ SOLN
0.5000 mL | INTRAMUSCULAR | Status: AC | PRN
  Administered 2021-01-28: .5 mL

## 2021-01-28 MED ORDER — METHYLPREDNISOLONE ACETATE 40 MG/ML IJ SUSP
40.0000 mg | INTRAMUSCULAR | Status: AC | PRN
Start: 2021-01-28 — End: 2021-01-28
  Administered 2021-01-28: 40 mg via INTRA_ARTICULAR

## 2021-01-28 NOTE — Progress Notes (Signed)
   Procedure Note  Patient: Tracey Armstrong             Date of Birth: 09-02-1935           MRN: [id number]             Visit Date: 01/28/2021 HPI: Ms. Laviolette comes in today wanting injections in both knees.  Patient unfortunately did have a fall and sustained a left femur fracture seen by Dr. Marge Duncans for this.  She is having pain in both knees today.  She is nondiabetic.  She has known osteoarthritis left knee.  She also has arthritis involving the right knee.  She has had cortisone injections as needed since February 21.  Physical exam bilateral knees: Good range of motion of both knees no abnormal warmth erythema or effusion of either knee.  Tenderness along medial lateral joint line of both knees.   Procedures: Visit Diagnoses:  1. Primary osteoarthritis of left knee   2. Primary osteoarthritis of right knee     Large Joint Inj: bilateral knee on 01/28/2021 9:55 AM Indications: pain Details: 22 G 1.5 in needle, anterolateral approach  Arthrogram: No  Medications (Right): 0.5 mL lidocaine 1 %; 40 mg methylPREDNISolone acetate 40 MG/ML Medications (Left): 0.5 mL lidocaine 1 %; 40 mg methylPREDNISolone acetate 40 MG/ML Outcome: tolerated well, no immediate complications Procedure, treatment alternatives, risks and benefits explained, specific risks discussed. Consent was given by the patient. Immediately prior to procedure a time out was called to verify the correct patient, procedure, equipment, support staff and site/side marked as required. Patient was prepped and draped in the usual sterile fashion.     Plan: She understands to wait at least 3 months between injections.  Questions encouraged and answered at length.  Follow-up as needed

## 2021-01-29 DIAGNOSIS — M6281 Muscle weakness (generalized): Secondary | ICD-10-CM | POA: Diagnosis not present

## 2021-01-29 DIAGNOSIS — M5459 Other low back pain: Secondary | ICD-10-CM | POA: Diagnosis not present

## 2021-01-29 DIAGNOSIS — R262 Difficulty in walking, not elsewhere classified: Secondary | ICD-10-CM | POA: Diagnosis not present

## 2021-01-29 DIAGNOSIS — M25562 Pain in left knee: Secondary | ICD-10-CM | POA: Diagnosis not present

## 2021-01-30 DIAGNOSIS — M5459 Other low back pain: Secondary | ICD-10-CM | POA: Diagnosis not present

## 2021-01-30 DIAGNOSIS — M6281 Muscle weakness (generalized): Secondary | ICD-10-CM | POA: Diagnosis not present

## 2021-01-30 DIAGNOSIS — M25562 Pain in left knee: Secondary | ICD-10-CM | POA: Diagnosis not present

## 2021-01-30 DIAGNOSIS — R262 Difficulty in walking, not elsewhere classified: Secondary | ICD-10-CM | POA: Diagnosis not present

## 2021-02-04 DIAGNOSIS — Z20828 Contact with and (suspected) exposure to other viral communicable diseases: Secondary | ICD-10-CM | POA: Diagnosis not present

## 2021-02-04 DIAGNOSIS — M5459 Other low back pain: Secondary | ICD-10-CM | POA: Diagnosis not present

## 2021-02-04 DIAGNOSIS — Z1159 Encounter for screening for other viral diseases: Secondary | ICD-10-CM | POA: Diagnosis not present

## 2021-02-04 DIAGNOSIS — M25562 Pain in left knee: Secondary | ICD-10-CM | POA: Diagnosis not present

## 2021-02-04 DIAGNOSIS — M6281 Muscle weakness (generalized): Secondary | ICD-10-CM | POA: Diagnosis not present

## 2021-02-04 DIAGNOSIS — R262 Difficulty in walking, not elsewhere classified: Secondary | ICD-10-CM | POA: Diagnosis not present

## 2021-02-06 ENCOUNTER — Other Ambulatory Visit: Payer: Self-pay | Admitting: Family Medicine

## 2021-02-06 ENCOUNTER — Ambulatory Visit (INDEPENDENT_AMBULATORY_CARE_PROVIDER_SITE_OTHER): Payer: Medicare Other | Admitting: Physical Medicine and Rehabilitation

## 2021-02-06 ENCOUNTER — Other Ambulatory Visit: Payer: Self-pay

## 2021-02-06 ENCOUNTER — Encounter: Payer: Self-pay | Admitting: Physical Medicine and Rehabilitation

## 2021-02-06 VITALS — BP 131/80 | HR 71

## 2021-02-06 DIAGNOSIS — M5416 Radiculopathy, lumbar region: Secondary | ICD-10-CM

## 2021-02-06 DIAGNOSIS — M6281 Muscle weakness (generalized): Secondary | ICD-10-CM | POA: Diagnosis not present

## 2021-02-06 DIAGNOSIS — M25562 Pain in left knee: Secondary | ICD-10-CM | POA: Diagnosis not present

## 2021-02-06 DIAGNOSIS — G894 Chronic pain syndrome: Secondary | ICD-10-CM | POA: Diagnosis not present

## 2021-02-06 DIAGNOSIS — M48062 Spinal stenosis, lumbar region with neurogenic claudication: Secondary | ICD-10-CM

## 2021-02-06 DIAGNOSIS — M5459 Other low back pain: Secondary | ICD-10-CM | POA: Diagnosis not present

## 2021-02-06 DIAGNOSIS — I10 Essential (primary) hypertension: Secondary | ICD-10-CM

## 2021-02-06 DIAGNOSIS — S32010S Wedge compression fracture of first lumbar vertebra, sequela: Secondary | ICD-10-CM | POA: Diagnosis not present

## 2021-02-06 DIAGNOSIS — R262 Difficulty in walking, not elsewhere classified: Secondary | ICD-10-CM | POA: Diagnosis not present

## 2021-02-06 NOTE — Progress Notes (Signed)
Pt state lower back pain that travels down to her left leg. Pt state walking, standing and sitting makes the pain worse. Pt state she feels pain when she tries to get out of bed. Pt state she take pain meds to help ease her pain.  Numeric Pain Rating Scale and Functional Assessment Average Pain 10 Pain Right Now 7 My pain is constant and aching Pain is worse with: walking, sitting, standing and some activites Pain improves with: rest, therapy/exercise and medication   In the last MONTH (on 0-10 scale) has pain interfered with the following?  1. General activity like being  able to carry out your everyday physical activities such as walking, climbing stairs, carrying groceries, or moving a chair?  Rating(9)  2. Relation with others like being able to carry out your usual social activities and roles such as  activities at home, at work and in your community. Rating(9)  3. Enjoyment of life such that you have  been bothered by emotional problems such as feeling anxious, depressed or irritable?  Rating(9)

## 2021-02-11 DIAGNOSIS — M6281 Muscle weakness (generalized): Secondary | ICD-10-CM | POA: Diagnosis not present

## 2021-02-11 DIAGNOSIS — M5459 Other low back pain: Secondary | ICD-10-CM | POA: Diagnosis not present

## 2021-02-11 DIAGNOSIS — Z20828 Contact with and (suspected) exposure to other viral communicable diseases: Secondary | ICD-10-CM | POA: Diagnosis not present

## 2021-02-11 DIAGNOSIS — R262 Difficulty in walking, not elsewhere classified: Secondary | ICD-10-CM | POA: Diagnosis not present

## 2021-02-11 DIAGNOSIS — M25562 Pain in left knee: Secondary | ICD-10-CM | POA: Diagnosis not present

## 2021-02-11 DIAGNOSIS — Z1159 Encounter for screening for other viral diseases: Secondary | ICD-10-CM | POA: Diagnosis not present

## 2021-02-13 DIAGNOSIS — M6281 Muscle weakness (generalized): Secondary | ICD-10-CM | POA: Diagnosis not present

## 2021-02-13 DIAGNOSIS — R262 Difficulty in walking, not elsewhere classified: Secondary | ICD-10-CM | POA: Diagnosis not present

## 2021-02-13 DIAGNOSIS — M25562 Pain in left knee: Secondary | ICD-10-CM | POA: Diagnosis not present

## 2021-02-13 DIAGNOSIS — M5459 Other low back pain: Secondary | ICD-10-CM | POA: Diagnosis not present

## 2021-02-14 ENCOUNTER — Ambulatory Visit: Payer: Self-pay

## 2021-02-14 ENCOUNTER — Encounter: Payer: Self-pay | Admitting: Physical Medicine and Rehabilitation

## 2021-02-14 ENCOUNTER — Ambulatory Visit (INDEPENDENT_AMBULATORY_CARE_PROVIDER_SITE_OTHER): Payer: Medicare Other | Admitting: Physical Medicine and Rehabilitation

## 2021-02-14 VITALS — BP 128/83 | HR 125

## 2021-02-14 DIAGNOSIS — M5416 Radiculopathy, lumbar region: Secondary | ICD-10-CM

## 2021-02-14 MED ORDER — BETAMETHASONE SOD PHOS & ACET 6 (3-3) MG/ML IJ SUSP
12.0000 mg | Freq: Once | INTRAMUSCULAR | Status: AC
  Administered 2021-02-14: 12 mg

## 2021-02-14 NOTE — Progress Notes (Signed)
Tracey Armstrong - 86 y.o. female MRN [id number]  Date of birth: 1935-06-23  Office Visit Note: Visit Date: 02/14/2021 PCP: Martinique, [name], MD Referred by: Martinique, [name], MD  Subjective: Chief Complaint  Patient presents with  . Lower Back - Pain   HPI:  Tracey Armstrong is a 86 y.o. female who comes in today for planned Left L5-S1 Lumbar epidural steroid injection with fluoroscopic guidance.  The patient has failed conservative care including home exercise, medications, time and activity modification.  This injection will be diagnostic and hopefully therapeutic.  Please see requesting physician notes for further details and justification.   ROS Otherwise per HPI.  Assessment & Plan: Visit Diagnoses:    ICD-10-CM   1. Lumbar radiculopathy  M54.16 XR C-ARM NO REPORT    Epidural Steroid injection    betamethasone acetate-betamethasone sodium phosphate (CELESTONE) injection 12 mg    Plan: No additional findings.   Meds & Orders:  Meds ordered this encounter  Medications  . betamethasone acetate-betamethasone sodium phosphate (CELESTONE) injection 12 mg    Orders Placed This Encounter  Procedures  . XR C-ARM NO REPORT  . Epidural Steroid injection    Follow-up: Return if symptoms worsen or fail to improve.   Procedures: No procedures performed  Lumbar Epidural Steroid Injection - Interlaminar Approach with Fluoroscopic Guidance  Patient: Tracey Armstrong      Date of Birth: 01-27-1935 MRN: [id number] PCP: Martinique, [name], MD      Visit Date: 02/14/2021   Universal Protocol:     Consent Given By: the patient  Position: PRONE  Additional Comments: Vital signs were monitored before and after the procedure. Patient was prepped and draped in the usual sterile fashion. The correct patient, procedure, and site was verified.   Injection Procedure Details:   Procedure diagnoses: Lumbar radiculopathy [M54.16]   Meds Administered:  Meds ordered this encounter  Medications   . betamethasone acetate-betamethasone sodium phosphate (CELESTONE) injection 12 mg     Laterality: Left  Location/Site:  L5-S1  Needle: 3.5 in., 20 ga. Tuohy  Needle Placement: Paramedian epidural  Findings:   -Comments: Excellent flow of contrast into the epidural space.  Procedure Details: Using a paramedian approach from the side mentioned above, the region overlying the inferior lamina was localized under fluoroscopic visualization and the soft tissues overlying this structure were infiltrated with 4 ml. of 1% Lidocaine without Epinephrine. The Tuohy needle was inserted into the epidural space using a paramedian approach.   The epidural space was localized using loss of resistance along with counter oblique bi-planar fluoroscopic views.  After negative aspirate for air, blood, and CSF, a 2 ml. volume of Isovue-250 was injected into the epidural space and the flow of contrast was observed. Radiographs were obtained for documentation purposes.    The injectate was administered into the level noted above.   Additional Comments:  No complications occurred Dressing: 2 x 2 sterile gauze and Band-Aid    Post-procedure details: Patient was observed during the procedure. Post-procedure instructions were reviewed.  Patient left the clinic in stable condition.     Clinical History: CT LUMBAR SPINE WITHOUT CONTRAST   TECHNIQUE:  Multidetector CT imaging of the lumbar spine was performed without  intravenous contrast administration. Multiplanar CT image  reconstructions were also generated.   COMPARISON: Lumbar spine radiographs 04/04/2020 and MRI 09/09/2018   FINDINGS:  Segmentation: 5 lumbar type vertebrae.   Alignment: Mild thoracolumbar levoscoliosis. Grade 1 anterolisthesis  of L4 on L5, unchanged from  the prior MRI.   Vertebrae: L1 superior endplate compression fracture with 30%  vertebral body height loss and 4 mm retropulsion of the superior  endplate. A  fracture line remains visible although there is  surrounding sclerosis. Hemangioma in the L4 vertebral body.   Paraspinal and other soft tissues: Aortic atherosclerosis without  aneurysm.   Disc levels: Disc bulging and posterior element hypertrophy result  in chronic mild-to-moderate spinal stenosis at L3-4 and moderate to  severe spinal stenosis at L4-5. No significant spinal stenosis  related to mild L1 retropulsion.   IMPRESSION:  1. Possibly subacute L1 compression fracture with 30% height loss  and 4 mm retropulsion. No associated spinal stenosis.  2. Chronic mild-to-moderate spinal stenosis at L3-4 and moderate to  severe spinal stenosis at L4-5.  3. Aortic Atherosclerosis (ICD10-I70.0).    Electronically Signed  By: Logan Bores M.D.  On: 04/04/2020 17:39 ---   MRI LUMBAR SPINE WITHOUT CONTRAST  TECHNIQUE: Multiplanar, multisequence MR imaging of the lumbar spine was performed. No intravenous contrast was administered.  COMPARISON: Lumbar MRI 06/20/2016  FINDINGS: Segmentation: Normal  Alignment: Mild retrolisthesis T12-L1, L1-2, L2-3. 5 mm anterolisthesis L4-5 has progressed in the interval.  Vertebrae: Normal bone marrow. Negative for fracture or mass.  Conus medullaris and cauda equina: Conus extends to the L2 level. Conus and cauda equina appear normal.  Paraspinal and other soft tissues: Negative for paraspinous mass or fluid collection. Retroperitoneal structures negative.  Disc levels:  T12-L1: Mild disc bulging without stenosis  L1-2: Small central disc protrusion unchanged. Mild disc and facet degeneration without significant stenosis  L2-3: Mild disc and moderate facet degeneration. No significant stenosis.  L3-4: Disc degeneration with diffuse disc bulging. Moderate facet hypertrophy. Mild spinal stenosis and mild subarticular stenosis bilaterally  L4-5: 4 mm anterolisthesis has progressed in the interval.  Advanced facet degeneration. 4 mm synovial cyst on the left projecting into the subarticular zone and causing subarticular stenosis on the left. Moderate spinal stenosis. Moderate subarticular stenosis on the left could affect the left L5 nerve root.  L5-S1: Mild disc degeneration. Moderate facet degeneration. Small left-sided synovial cyst has improved in the interval. No significant stenosis.  IMPRESSION: Small central disc protrusion L1-2 unchanged  Mild spinal stenosis and mild subarticular stenosis bilaterally at L3-4  Progressive anterolisthesis L4-5. 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  Improvement in left-sided synovial cyst L5-S1.   Electronically Signed By: Franchot Gallo M.D. On: 09/09/2018 10:49     Objective:  VS:  HT:    WT:   BMI:     BP:128/83  HR:(!) 125bpm  TEMP: ( )  RESP:  Physical Exam Vitals and nursing note reviewed.  Constitutional:      General: She is not in acute distress.    Appearance: Normal appearance. She is not ill-appearing.  HENT:     Head: Normocephalic and atraumatic.     Right Ear: External ear normal.     Left Ear: External ear normal.  Eyes:     Extraocular Movements: Extraocular movements intact.  Cardiovascular:     Rate and Rhythm: Normal rate.     Pulses: Normal pulses.  Pulmonary:     Effort: Pulmonary effort is normal. No respiratory distress.  Abdominal:     General: There is no distension.     Palpations: Abdomen is soft.  Musculoskeletal:        General: Tenderness present.     Cervical  back: Neck supple.     Right lower leg: No edema.     Left lower leg: No edema.     Comments: Patient has good distal strength with no pain over the greater trochanters.  No clonus or focal weakness.  Ambulates with rolling walker.  Skin:    Findings: No erythema, lesion or rash.  Neurological:     General: No focal deficit present.     Mental  Status: She is alert and oriented to person, place, and time.     Sensory: No sensory deficit.     Motor: No weakness or abnormal muscle tone.     Coordination: Coordination normal.     Gait: Gait abnormal.  Psychiatric:        Mood and Affect: Mood normal.        Behavior: Behavior normal.      Imaging: No results found.

## 2021-02-14 NOTE — Procedures (Signed)
Lumbar Epidural Steroid Injection - Interlaminar Approach with Fluoroscopic Guidance  Patient: Tracey Armstrong      Date of Birth: 1935-09-20 MRN: [id number] PCP: Martinique, [name], MD      Visit Date: 02/14/2021   Universal Protocol:     Consent Given By: the patient  Position: PRONE  Additional Comments: Vital signs were monitored before and after the procedure. Patient was prepped and draped in the usual sterile fashion. The correct patient, procedure, and site was verified.   Injection Procedure Details:   Procedure diagnoses: Lumbar radiculopathy [M54.16]   Meds Administered:  Meds ordered this encounter  Medications  . betamethasone acetate-betamethasone sodium phosphate (CELESTONE) injection 12 mg     Laterality: Left  Location/Site:  L5-S1  Needle: 3.5 in., 20 ga. Tuohy  Needle Placement: Paramedian epidural  Findings:   -Comments: Excellent flow of contrast into the epidural space.  Procedure Details: Using a paramedian approach from the side mentioned above, the region overlying the inferior lamina was localized under fluoroscopic visualization and the soft tissues overlying this structure were infiltrated with 4 ml. of 1% Lidocaine without Epinephrine. The Tuohy needle was inserted into the epidural space using a paramedian approach.   The epidural space was localized using loss of resistance along with counter oblique bi-planar fluoroscopic views.  After negative aspirate for air, blood, and CSF, a 2 ml. volume of Isovue-250 was injected into the epidural space and the flow of contrast was observed. Radiographs were obtained for documentation purposes.    The injectate was administered into the level noted above.   Additional Comments:  No complications occurred Dressing: 2 x 2 sterile gauze and Band-Aid    Post-procedure details: Patient was observed during the procedure. Post-procedure instructions were reviewed.  Patient left the clinic in stable  condition.

## 2021-02-14 NOTE — Progress Notes (Signed)
Pt state lower back pain. Pt state walking, standing and sitting makes the pain worse. Pt state she take pain meds.  Numeric Pain Rating Scale and Functional Assessment Average Pain 5   In the last MONTH (on 0-10 scale) has pain interfered with the following?  1. General activity like being  able to carry out your everyday physical activities such as walking, climbing stairs, carrying groceries, or moving a chair?  Rating(8)   +Driver, -BT, -Dye Allergies.

## 2021-02-14 NOTE — Patient Instructions (Signed)

## 2021-02-18 DIAGNOSIS — M5459 Other low back pain: Secondary | ICD-10-CM | POA: Diagnosis not present

## 2021-02-18 DIAGNOSIS — Z1159 Encounter for screening for other viral diseases: Secondary | ICD-10-CM | POA: Diagnosis not present

## 2021-02-18 DIAGNOSIS — R262 Difficulty in walking, not elsewhere classified: Secondary | ICD-10-CM | POA: Diagnosis not present

## 2021-02-18 DIAGNOSIS — Z20828 Contact with and (suspected) exposure to other viral communicable diseases: Secondary | ICD-10-CM | POA: Diagnosis not present

## 2021-02-18 DIAGNOSIS — M25562 Pain in left knee: Secondary | ICD-10-CM | POA: Diagnosis not present

## 2021-02-18 DIAGNOSIS — M6281 Muscle weakness (generalized): Secondary | ICD-10-CM | POA: Diagnosis not present

## 2021-02-20 ENCOUNTER — Ambulatory Visit: Payer: Medicare Other

## 2021-02-20 DIAGNOSIS — R262 Difficulty in walking, not elsewhere classified: Secondary | ICD-10-CM | POA: Diagnosis not present

## 2021-02-20 DIAGNOSIS — M5459 Other low back pain: Secondary | ICD-10-CM | POA: Diagnosis not present

## 2021-02-20 DIAGNOSIS — M25562 Pain in left knee: Secondary | ICD-10-CM | POA: Diagnosis not present

## 2021-02-20 DIAGNOSIS — M6281 Muscle weakness (generalized): Secondary | ICD-10-CM | POA: Diagnosis not present

## 2021-02-25 DIAGNOSIS — Z20828 Contact with and (suspected) exposure to other viral communicable diseases: Secondary | ICD-10-CM | POA: Diagnosis not present

## 2021-02-25 DIAGNOSIS — M6281 Muscle weakness (generalized): Secondary | ICD-10-CM | POA: Diagnosis not present

## 2021-02-25 DIAGNOSIS — R262 Difficulty in walking, not elsewhere classified: Secondary | ICD-10-CM | POA: Diagnosis not present

## 2021-02-25 DIAGNOSIS — Z1159 Encounter for screening for other viral diseases: Secondary | ICD-10-CM | POA: Diagnosis not present

## 2021-02-25 DIAGNOSIS — M5459 Other low back pain: Secondary | ICD-10-CM | POA: Diagnosis not present

## 2021-02-25 DIAGNOSIS — M25562 Pain in left knee: Secondary | ICD-10-CM | POA: Diagnosis not present

## 2021-02-26 ENCOUNTER — Other Ambulatory Visit: Payer: Self-pay | Admitting: Family Medicine

## 2021-02-26 DIAGNOSIS — F411 Generalized anxiety disorder: Secondary | ICD-10-CM

## 2021-02-26 DIAGNOSIS — N393 Stress incontinence (female) (male): Secondary | ICD-10-CM

## 2021-02-26 NOTE — Telephone Encounter (Signed)
Last filled 01/29/21, can you advise in pcp's absence?

## 2021-02-27 DIAGNOSIS — M6281 Muscle weakness (generalized): Secondary | ICD-10-CM | POA: Diagnosis not present

## 2021-02-27 DIAGNOSIS — M5459 Other low back pain: Secondary | ICD-10-CM | POA: Diagnosis not present

## 2021-02-27 DIAGNOSIS — R262 Difficulty in walking, not elsewhere classified: Secondary | ICD-10-CM | POA: Diagnosis not present

## 2021-02-27 DIAGNOSIS — M25562 Pain in left knee: Secondary | ICD-10-CM | POA: Diagnosis not present

## 2021-02-28 ENCOUNTER — Telehealth: Payer: Self-pay | Admitting: Family Medicine

## 2021-02-28 NOTE — Telephone Encounter (Signed)
Pt is calling to check the status of her Rx alprazolam Duanne Moron) 1 MG pt is aware that is has been sent to another provider to assist with it in the absence of her provider.

## 2021-02-28 NOTE — Progress Notes (Signed)
  Chronic Care Management   Note  02/28/2021 Name: Tracey Armstrong MRN: [id number] DOB: 09/29/1935  Tracey Armstrong is a 86 y.o. year old female who is a primary care patient of Martinique, Malka So, MD. I reached out to [employer] by phone today in response to a referral sent by Tracey Armstrong's PCP, Martinique, [name], MD.   Tracey Armstrong was given information about Chronic Care Management services today including:  1. CCM service includes personalized support from designated clinical staff supervised by her physician, including individualized plan of care and coordination with other care providers 2. 24/7 contact phone numbers for assistance for urgent and routine care needs. 3. Service will only be billed when office clinical staff spend 20 minutes or more in a month to coordinate care. 4. Only one practitioner may furnish and bill the service in a calendar month. 5. The patient may stop CCM services at any time (effective at the end of the month) by phone call to the office staff.   Patient wishes to consider information provided and/or speak with a member of the care team before deciding about enrollment in care management services.   Follow up plan:   Tracey Armstrong [employer] Scheduler

## 2021-03-04 DIAGNOSIS — M25562 Pain in left knee: Secondary | ICD-10-CM | POA: Diagnosis not present

## 2021-03-04 DIAGNOSIS — M6281 Muscle weakness (generalized): Secondary | ICD-10-CM | POA: Diagnosis not present

## 2021-03-04 DIAGNOSIS — M5459 Other low back pain: Secondary | ICD-10-CM | POA: Diagnosis not present

## 2021-03-04 DIAGNOSIS — R262 Difficulty in walking, not elsewhere classified: Secondary | ICD-10-CM | POA: Diagnosis not present

## 2021-03-04 DIAGNOSIS — Z20828 Contact with and (suspected) exposure to other viral communicable diseases: Secondary | ICD-10-CM | POA: Diagnosis not present

## 2021-03-04 DIAGNOSIS — Z1159 Encounter for screening for other viral diseases: Secondary | ICD-10-CM | POA: Diagnosis not present

## 2021-03-06 DIAGNOSIS — M25562 Pain in left knee: Secondary | ICD-10-CM | POA: Diagnosis not present

## 2021-03-06 DIAGNOSIS — M5459 Other low back pain: Secondary | ICD-10-CM | POA: Diagnosis not present

## 2021-03-06 DIAGNOSIS — M6281 Muscle weakness (generalized): Secondary | ICD-10-CM | POA: Diagnosis not present

## 2021-03-06 DIAGNOSIS — R262 Difficulty in walking, not elsewhere classified: Secondary | ICD-10-CM | POA: Diagnosis not present

## 2021-03-08 ENCOUNTER — Other Ambulatory Visit: Payer: Self-pay | Admitting: Family Medicine

## 2021-03-08 NOTE — Telephone Encounter (Signed)
Last OV: 12/12/2020 Last Refill: 09/24/2020 Disp: 60  R: 0 Future OV: 04/15/2021

## 2021-03-11 ENCOUNTER — Other Ambulatory Visit: Payer: Self-pay

## 2021-03-11 DIAGNOSIS — M25562 Pain in left knee: Secondary | ICD-10-CM | POA: Diagnosis not present

## 2021-03-11 DIAGNOSIS — R262 Difficulty in walking, not elsewhere classified: Secondary | ICD-10-CM | POA: Diagnosis not present

## 2021-03-11 DIAGNOSIS — Z20828 Contact with and (suspected) exposure to other viral communicable diseases: Secondary | ICD-10-CM | POA: Diagnosis not present

## 2021-03-11 DIAGNOSIS — M6281 Muscle weakness (generalized): Secondary | ICD-10-CM | POA: Diagnosis not present

## 2021-03-11 DIAGNOSIS — M5459 Other low back pain: Secondary | ICD-10-CM | POA: Diagnosis not present

## 2021-03-11 DIAGNOSIS — Z1159 Encounter for screening for other viral diseases: Secondary | ICD-10-CM | POA: Diagnosis not present

## 2021-03-11 MED ORDER — TIZANIDINE HCL 4 MG PO TABS: ORAL_TABLET | ORAL | 0 refills | Status: DC

## 2021-03-13 DIAGNOSIS — M6281 Muscle weakness (generalized): Secondary | ICD-10-CM | POA: Diagnosis not present

## 2021-03-13 DIAGNOSIS — M25562 Pain in left knee: Secondary | ICD-10-CM | POA: Diagnosis not present

## 2021-03-13 DIAGNOSIS — R262 Difficulty in walking, not elsewhere classified: Secondary | ICD-10-CM | POA: Diagnosis not present

## 2021-03-13 DIAGNOSIS — M5459 Other low back pain: Secondary | ICD-10-CM | POA: Diagnosis not present

## 2021-03-14 DIAGNOSIS — Z20828 Contact with and (suspected) exposure to other viral communicable diseases: Secondary | ICD-10-CM | POA: Diagnosis not present

## 2021-03-14 DIAGNOSIS — Z1159 Encounter for screening for other viral diseases: Secondary | ICD-10-CM | POA: Diagnosis not present

## 2021-03-18 DIAGNOSIS — Z1159 Encounter for screening for other viral diseases: Secondary | ICD-10-CM | POA: Diagnosis not present

## 2021-03-18 DIAGNOSIS — Z20828 Contact with and (suspected) exposure to other viral communicable diseases: Secondary | ICD-10-CM | POA: Diagnosis not present

## 2021-03-19 DIAGNOSIS — M25562 Pain in left knee: Secondary | ICD-10-CM | POA: Diagnosis not present

## 2021-03-19 DIAGNOSIS — M6281 Muscle weakness (generalized): Secondary | ICD-10-CM | POA: Diagnosis not present

## 2021-03-19 DIAGNOSIS — R262 Difficulty in walking, not elsewhere classified: Secondary | ICD-10-CM | POA: Diagnosis not present

## 2021-03-19 DIAGNOSIS — M5459 Other low back pain: Secondary | ICD-10-CM | POA: Diagnosis not present

## 2021-03-21 DIAGNOSIS — M6281 Muscle weakness (generalized): Secondary | ICD-10-CM | POA: Diagnosis not present

## 2021-03-21 DIAGNOSIS — M5459 Other low back pain: Secondary | ICD-10-CM | POA: Diagnosis not present

## 2021-03-21 DIAGNOSIS — M25562 Pain in left knee: Secondary | ICD-10-CM | POA: Diagnosis not present

## 2021-03-21 DIAGNOSIS — R262 Difficulty in walking, not elsewhere classified: Secondary | ICD-10-CM | POA: Diagnosis not present

## 2021-03-25 DIAGNOSIS — Z1159 Encounter for screening for other viral diseases: Secondary | ICD-10-CM | POA: Diagnosis not present

## 2021-03-25 DIAGNOSIS — Z20828 Contact with and (suspected) exposure to other viral communicable diseases: Secondary | ICD-10-CM | POA: Diagnosis not present

## 2021-03-26 DIAGNOSIS — M25562 Pain in left knee: Secondary | ICD-10-CM | POA: Diagnosis not present

## 2021-03-26 DIAGNOSIS — M5459 Other low back pain: Secondary | ICD-10-CM | POA: Diagnosis not present

## 2021-03-26 DIAGNOSIS — M6281 Muscle weakness (generalized): Secondary | ICD-10-CM | POA: Diagnosis not present

## 2021-03-26 DIAGNOSIS — R262 Difficulty in walking, not elsewhere classified: Secondary | ICD-10-CM | POA: Diagnosis not present

## 2021-03-27 ENCOUNTER — Other Ambulatory Visit: Payer: Self-pay

## 2021-03-27 NOTE — Progress Notes (Incomplete)
Virtual Visit via Telephone Note  I connected with  Tracey Armstrong on 03/27/21 at  3:15 PM EDT by telephone and verified that I am speaking with the correct person using two identifiers.  Medicare Annual Wellness visit completed telephonically due to Covid-19 pandemic.   Persons participating in this call: This Health Coach and this patient. Location: Patient: Home Provider: Office   I discussed the limitations, risks, security and privacy concerns of performing an evaluation and management service by telephone and the availability of in person appointments. The patient expressed understanding and agreed to proceed.  Unable to perform video visit due to video visit attempted and failed and/or patient does not have video capability.  Some vital signs may be absent or patient reported.   Willette Brace, LPN    Subjective:   Tracey Armstrong is a 86 y.o. female who presents for Medicare Annual (Subsequent) preventive examination.  Review of Systems           Objective:    There were no vitals filed for this visit. There is no height or weight on file to calculate BMI.  Advanced Directives 06/02/2020 04/04/2020 03/30/2015  Does Patient Have a Medical Advance Directive? No No;Yes Yes  Type of Advance Directive - Living will Healthcare Power of [place];Living will  Would patient like information on creating a medical advance directive? No - Patient declined - -    Current Medications (verified) Outpatient Encounter Medications as of 03/27/2021  Medication Sig   acetaminophen (TYLENOL) 650 MG CR tablet Take 975 mg by mouth daily.   ALPRAZolam (XANAX) 1 MG tablet TAKE 1/2 TABLET BY MOUTH AT NOON AND 1 TABLET AT BEDTIME   antiseptic oral rinse (BIOTENE) LIQD Use 3 mls 3-4 times per day for dry mouth. Do not swallow.   Cyanocobalamin (VITAMIN B-12 PO) Take 1 tablet by mouth daily with lunch.   diphenhydramine-acetaminophen (TYLENOL PM) 25-500 MG TABS tablet Take 1 tablet by mouth  at bedtime.   docusate sodium (COLACE) 100 MG capsule Take 1 capsule (100 mg total) by mouth 2 (two) times daily.   gabapentin (NEURONTIN) 300 MG capsule Take 1 capsule (300 mg total) by mouth 2 (two) times daily.   HYDROcodone-acetaminophen (NORCO/VICODIN) 5-325 MG tablet Take 1 tablet by mouth 2 (two) times daily as needed.   Magnesium 400 MG TABS Take 400 mg by mouth at bedtime.   MAXALT-MLT 10 MG disintegrating tablet DISSOLVE ONE TABLET BY MOUTH AS NEEDED HEADACHE (Patient taking differently: Take 10 mg by mouth daily as needed for migraine (Headache.).)   Melatonin 10 MG TABS Take 10 mg by mouth at bedtime.   omeprazole (PRILOSEC OTC) 20 MG tablet Take 20 mg by mouth daily before breakfast.   oxybutynin (DITROPAN-XL) 10 MG 24 hr tablet TAKE 1 TABLET(10 MG) BY MOUTH AT BEDTIME   POTASSIUM PO Take 1 tablet by mouth daily with lunch.   pyridOXINE (VITAMIN B-6) 100 MG tablet Take 400 mg by mouth daily with lunch.   riboflavin (VITAMIN B-2) 100 MG TABS tablet Take 400 mg by mouth daily with lunch.   senna (SENOKOT) 8.6 MG TABS tablet Take 1 tablet (8.6 mg total) by mouth 2 (two) times daily.   tiZANidine (ZANAFLEX) 4 MG tablet TAKE 1 TABLET(4 MG) BY MOUTH TWICE DAILY AS NEEDED   tretinoin (RETIN-A) 0.1 % cream APPLY TO AFFECTED AREA EVERY DAY AT BEDTIME (Patient taking differently: Apply 1 application topically at bedtime as needed (brown spots).)   Turmeric 500 MG CAPS Take  500 mg by mouth daily with lunch.    verapamil (VERELAN PM) 180 MG 24 hr capsule TAKE 1 CAPSULE(180 MG) BY MOUTH DAILY   Vitamin D, Ergocalciferol, (DRISDOL) 1.25 MG (50000 UNIT) CAPS capsule TAKE 1 CAPSULE BY MOUTH EVERY 2 WEEKS   No facility-administered encounter medications on file as of 03/27/2021.    Allergies (verified) Dihydrotachysterol, Trazodone and nefazodone, Valium [diazepam], Citalopram, and Other   History: Past Medical History:  Diagnosis Date   Anxiety    Arthritis    Chronic  insomnia 03/30/2015   Chronic low back pain 12/02/2016   Depression    GERD (gastroesophageal reflux disease)    Hiatal hernia    High cholesterol    Hypertelorism    Hypertension    Meningioma (HCC)    Migraine    Past Surgical History:  Procedure Laterality Date   FEMUR IM NAIL Left 06/02/2020   Procedure: INTRAMEDULLARY (IM) RETROGRADE FEMORAL NAILING - LEFT;  Surgeon: Rod Can, MD;  Location: [facility];  Service: Orthopedics;  Laterality: Left;   STOMACH SURGERY     Family History  Problem Relation Age of Onset   Migraines Father    Stroke Father    Heart Problems Mother    Epilepsy Brother    Social History   Socioeconomic History   Marital status: Widowed    Spouse name: Not on file   Number of children: 4   Years of education: 12   Highest education level: Not on file  Occupational History   Occupation: Retired  Tobacco Use   Smoking status: Never Smoker   Smokeless tobacco: Never Used  Scientific laboratory technician Use: Never used  Substance and Sexual Activity   Alcohol use: No   Drug use: No   Sexual activity: Not Currently  Other Topics Concern   Not on file  Social History Narrative   Lives in [place]      Patient drinks 1 glass of tea daily.      Patient is right handed.   Social Determinants of Health   Financial Resource Strain: Not on file  Food Insecurity: Not on file  Transportation Needs: Not on file  Physical Activity: Not on file  Stress: Not on file  Social Connections: Not on file    Tobacco Counseling Counseling given: Not Answered   Clinical Intake:                 Diabetic?No         Activities of Daily Living In your present state of health, do you have any difficulty performing the following activities: 06/03/2020  Hearing? N  Vision? N  Difficulty concentrating or making decisions? N  Walking or climbing stairs? N  Dressing or bathing? N  Doing errands, shopping? N  Some  recent data might be hidden    Patient Care Team: Martinique, [name], MD as PCP - General (Family Medicine) Kristeen Miss, MD as Consulting Physician (Neurosurgery)  Indicate any recent Medical Services you may have received from other than [facility] providers in the past year (date may be approximate).     Assessment:   This is a routine wellness examination for Tracey Armstrong.  Hearing/Vision screen No exam data present  Dietary issues and exercise activities discussed:    Goals Addressed   None    Depression Screen PHQ 2/9 Scores 01/06/2018  PHQ - 2 Score 1    Fall Risk Fall Risk  09/14/2019 05/16/2019 01/06/2018  Falls in the  past year? 1 1 Yes  Comment Emmi Telephone Survey: data to providers prior to load - -  Number falls in past yr: 1 1 1   Comment Emmi Telephone Survey Actual Response = 6 - -  Injury with Fall? 1 0 -  Follow up - Falls evaluation completed Education provided    [place]:  Any stairs in or around the home? {YES/NO:21197} If so, are there any without handrails? {YES/NO:21197} Home free of loose throw rugs in walkways, pet beds, electrical cords, etc? Yes  Adequate lighting in your home to reduce risk of falls? Yes   ASSISTIVE DEVICES UTILIZED TO PREVENT FALLS:  Life alert? {YES/NO:21197} Use of a cane, walker or w/c? {YES/NO:21197} Grab bars in the bathroom? {YES/NO:21197} Shower chair or bench in shower? {YES/NO:21197} Elevated toilet seat or a handicapped toilet? {YES/NO:21197}  TIMED UP AND GO:  Was the test performed? No     Cognitive Function: MMSE - Mini Mental State Exam 01/06/2018  Not completed: (No Data)        Immunizations  There is no immunization history on file for this patient.  TDAP status: Up to date  Flu Vaccine status: Declined, Education has been provided regarding the importance of this vaccine but patient still declined. Advised may receive this vaccine at local pharmacy or Health Dept.  Aware to provide a copy of the vaccination record if obtained from local pharmacy or Health Dept. Verbalized acceptance and understanding.    {Covid-19 vaccine status:2101808}  Qualifies for Shingles Vaccine? Yes   Zostavax completed No   Shingrix Completed?: No.    Education has been provided regarding the importance of this vaccine. Patient has been advised to call insurance company to determine out of pocket expense if they have not yet received this vaccine. Advised may also receive vaccine at local pharmacy or Health Dept. Verbalized acceptance and understanding.  Screening Tests Health Maintenance  Topic Date Due   COVID-19 Vaccine (1) Never done   Zoster Vaccines- Shingrix (1 of 2) Never done   TETANUS/TDAP  08/30/2024   DEXA SCAN  Completed   Pneumococcal Vaccine 58-59 Years old  Aged Out   HPV VACCINES  Aged Out   INFLUENZA VACCINE  Discontinued   PNA vac Low Risk Adult  Discontinued    Health Maintenance  Health Maintenance Due  Topic Date Due   COVID-19 Vaccine (1) Never done   Zoster Vaccines- Shingrix (1 of 2) Never done    Colorectal cancer screening: No longer required.   Mammogram status: No longer required due to age.  Bone Density status: Completed 01/22/18. Results reflect: Bone density results: OSTEOPOROSIS. Repeat every 3-5 years.    Additional Screening:  Vision Screening: Recommended annual ophthalmology exams for early detection of glaucoma and other disorders of the eye. Is the patient up to date with their annual eye exam?  {YES/NO:21197} Who is the provider or what is the name of the office in which the patient attends annual eye exams? *** If pt is not established with a provider, would they like to be referred to a provider to establish care? {YES/NO:21197}.   Dental Screening: Recommended annual dental exams for proper oral hygiene  Community Resource Referral / Chronic Care Management: CRR required this visit?   {YES/NO:21197}  CCM required this visit?  {YES/NO:21197}     Plan:     I have personally reviewed and noted the following in the patients chart:    Medical and social history  Use of alcohol, tobacco or illicit drugs   Current medications and supplements including opioid prescriptions.   Functional ability and status  Nutritional status  Physical activity  Advanced directives  List of other physicians  Hospitalizations, surgeries, and ER visits in previous 12 months  Vitals  Screenings to include cognitive, depression, and falls  Referrals and appointments  In addition, I have reviewed and discussed with patient certain preventive protocols, quality metrics, and best practice recommendations. A written personalized care plan for preventive services as well as general preventive health recommendations were provided to patient.     Willette Brace, LPN   0/04/2181   Nurse Notes: ***

## 2021-03-28 DIAGNOSIS — M25562 Pain in left knee: Secondary | ICD-10-CM | POA: Diagnosis not present

## 2021-03-28 DIAGNOSIS — M6281 Muscle weakness (generalized): Secondary | ICD-10-CM | POA: Diagnosis not present

## 2021-03-28 DIAGNOSIS — M5459 Other low back pain: Secondary | ICD-10-CM | POA: Diagnosis not present

## 2021-03-28 DIAGNOSIS — R262 Difficulty in walking, not elsewhere classified: Secondary | ICD-10-CM | POA: Diagnosis not present

## 2021-04-01 DIAGNOSIS — Z20828 Contact with and (suspected) exposure to other viral communicable diseases: Secondary | ICD-10-CM | POA: Diagnosis not present

## 2021-04-01 DIAGNOSIS — Z1159 Encounter for screening for other viral diseases: Secondary | ICD-10-CM | POA: Diagnosis not present

## 2021-04-02 ENCOUNTER — Emergency Department (HOSPITAL_COMMUNITY)
Admission: EM | Admit: 2021-04-02 | Discharge: 2021-04-02 | Disposition: A | Discharge status: Home / Self Care | Payer: Medicare Other | Attending: Emergency Medicine | Admitting: Emergency Medicine

## 2021-04-02 ENCOUNTER — Emergency Department (HOSPITAL_COMMUNITY): Payer: Medicare Other

## 2021-04-02 DIAGNOSIS — R531 Weakness: Secondary | ICD-10-CM | POA: Diagnosis not present

## 2021-04-02 DIAGNOSIS — E860 Dehydration: Secondary | ICD-10-CM | POA: Diagnosis not present

## 2021-04-02 DIAGNOSIS — U071 COVID-19: Secondary | ICD-10-CM | POA: Diagnosis not present

## 2021-04-02 DIAGNOSIS — R001 Bradycardia, unspecified: Secondary | ICD-10-CM | POA: Diagnosis not present

## 2021-04-02 DIAGNOSIS — I959 Hypotension, unspecified: Secondary | ICD-10-CM | POA: Insufficient documentation

## 2021-04-02 DIAGNOSIS — J9811 Atelectasis: Secondary | ICD-10-CM | POA: Diagnosis not present

## 2021-04-02 DIAGNOSIS — M5459 Other low back pain: Secondary | ICD-10-CM | POA: Diagnosis not present

## 2021-04-02 DIAGNOSIS — R262 Difficulty in walking, not elsewhere classified: Secondary | ICD-10-CM | POA: Diagnosis not present

## 2021-04-02 DIAGNOSIS — I10 Essential (primary) hypertension: Secondary | ICD-10-CM | POA: Insufficient documentation

## 2021-04-02 DIAGNOSIS — M25562 Pain in left knee: Secondary | ICD-10-CM | POA: Diagnosis not present

## 2021-04-02 DIAGNOSIS — R42 Dizziness and giddiness: Secondary | ICD-10-CM | POA: Diagnosis not present

## 2021-04-02 DIAGNOSIS — M6281 Muscle weakness (generalized): Secondary | ICD-10-CM | POA: Diagnosis not present

## 2021-04-02 LAB — CBC WITH DIFFERENTIAL/PLATELET
Abs Immature Granulocytes: 0.01 10*3/uL (ref 0.00–0.07)
Basophils Absolute: 0 10*3/uL (ref 0.0–0.1)
Basophils Relative: 1 %
Eosinophils Absolute: 0.1 10*3/uL (ref 0.0–0.5)
Eosinophils Relative: 2 %
HCT: 32 % — ABNORMAL LOW (ref 36.0–46.0)
Hemoglobin: 10.3 g/dL — ABNORMAL LOW (ref 12.0–15.0)
Immature Granulocytes: 0 %
Lymphocytes Relative: 44 %
Lymphs Abs: 2.3 10*3/uL (ref 0.7–4.0)
MCH: 32.4 pg (ref 26.0–34.0)
MCHC: 32.2 g/dL (ref 30.0–36.0)
MCV: 100.6 fL — ABNORMAL HIGH (ref 80.0–100.0)
Monocytes Absolute: 0.4 10*3/uL (ref 0.1–1.0)
Monocytes Relative: 8 %
Neutro Abs: 2.4 10*3/uL (ref 1.7–7.7)
Neutrophils Relative %: 45 %
Platelets: 212 10*3/uL (ref 150–400)
RBC: 3.18 MIL/uL — ABNORMAL LOW (ref 3.87–5.11)
RDW: 13 % (ref 11.5–15.5)
WBC: 5.2 10*3/uL (ref 4.0–10.5)
nRBC: 0 % (ref 0.0–0.2)

## 2021-04-02 LAB — COMPREHENSIVE METABOLIC PANEL
ALT: 16 U/L (ref 0–44)
AST: 20 U/L (ref 15–41)
Albumin: 2.9 g/dL — ABNORMAL LOW (ref 3.5–5.0)
Alkaline Phosphatase: 36 U/L — ABNORMAL LOW (ref 38–126)
Anion gap: 8 (ref 5–15)
BUN: 13 mg/dL (ref 8–23)
CO2: 22 mmol/L (ref 22–32)
Calcium: 8.4 mg/dL — ABNORMAL LOW (ref 8.9–10.3)
Chloride: 108 mmol/L (ref 98–111)
Creatinine, Ser: 1.05 mg/dL — ABNORMAL HIGH (ref 0.44–1.00)
GFR, Estimated: 52 mL/min — ABNORMAL LOW (ref >60)
Glucose, Bld: 106 mg/dL — ABNORMAL HIGH (ref 70–99)
Potassium: 4.2 mmol/L (ref 3.5–5.1)
Sodium: 138 mmol/L (ref 135–145)
Total Bilirubin: 0.5 mg/dL (ref 0.3–1.2)
Total Protein: 5.3 g/dL — ABNORMAL LOW (ref 6.5–8.1)

## 2021-04-02 LAB — RESP PANEL BY RT-PCR (FLU A&B, COVID) ARPGX2
Influenza A by PCR: NEGATIVE
Influenza B by PCR: NEGATIVE
SARS Coronavirus 2 by RT PCR: POSITIVE — AB

## 2021-04-02 LAB — TROPONIN I (HIGH SENSITIVITY): Troponin I (High Sensitivity): 9 ng/L (ref <18)

## 2021-04-02 LAB — LIPASE, BLOOD: Lipase: 23 U/L (ref 11–51)

## 2021-04-02 LAB — LACTIC ACID, PLASMA: Lactic Acid, Venous: 1.3 mmol/L (ref 0.5–1.9)

## 2021-04-02 MED ORDER — SODIUM CHLORIDE 0.9 % IV BOLUS
1000.0000 mL | Freq: Once | INTRAVENOUS | Status: AC
  Administered 2021-04-02: 1000 mL via INTRAVENOUS

## 2021-04-02 NOTE — ED Notes (Signed)
Received call from microbiology informing that pt is COVID positive.

## 2021-04-02 NOTE — ED Triage Notes (Signed)
BIB [facility] after staff at [facility] called to report pt feeling dizzy and hypotensive. Per EMS, pt 60/40 on scene and given 250 NS bolus enroute. B/p on arrival to [facility].   HX: HTN, Migraines, backpain

## 2021-04-02 NOTE — ED Notes (Signed)
Completed lab redraw of lavender and light green tubes as requested. Hand delivered to lab.

## 2021-04-02 NOTE — Discharge Instructions (Addendum)
You were seen in the emergency department today with low blood pressures.  I suspect this is due to dehydration and would encourage you to drink plenty of hydrating fluids at home.  Please follow closely with your primary care doctor.  If you develop any fever, burning with urination, pain in the chest or abdomen, other sudden/severe symptoms you should call 911 and/or return to the emergency department immediately for reevaluation. It was a pleasure taking care of you today.

## 2021-04-02 NOTE — ED Provider Notes (Signed)
Emergency Department Provider Note   I have reviewed the triage vital signs and the nursing notes.   HISTORY  Chief Complaint Hypotension   HPI Tracey Armstrong is a 86 y.o. female with past medical history reviewed below presents to the emergency department with fatigue, lightheadedness, low blood pressures.  Patient tells me she has had issues with low blood pressure in the past but is unsure of the cause.  She began feeling badly today.  Her physical therapy team showed up at [facility] to perform physical therapy and she felt too weak to do it.  They checked her blood pressure and found it to be low.  She denies any pain in her chest, shortness of breath, fever/chills.  No black or blood in the bowel movements.  No abdominal or back pain.  No urinary tract infection symptoms.  She denies taking any new medications.  She states that she stopped taking her blood pressure medicine 1 month ago at the recommendation of her physical therapy team also with low blood pressures at that time. No radiation of symptoms or modifying factors.   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   Synovial cyst of lumbar facet joint 04/05/2020   Spinal stenosis of lumbar region with neurogenic claudication 04/05/2020   Dehydration 04/05/2020   Closed wedge compression fracture of L1 vertebra (HCC) 04/04/2020   Ambulatory dysfunction 04/04/2020   Intractable low back pain 04/04/2020   L1 vertebral fracture (HCC) 04/04/2020   Urine, incontinence, stress female 02/07/2019   Knee osteoarthritis 02/01/2018   Iron  deficiency anemia 10/28/2017   Rhinitis, allergic 07/27/2017   GERD (gastroesophageal reflux disease) 06/16/2017   Chronic pain disorder 06/16/2017   Vitamin D deficiency, unspecified 05/25/2017   Hyperlipidemia 05/25/2017   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   Pain 02/26/2017   Chronic low back pain 12/02/2016   Abdominal pain 03/26/2016   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   Peptic esophagitis 03/26/2016   Neuralgia neuritis, sciatic nerve 03/26/2016   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   Chronic insomnia 03/30/2015   Chronic migraine 01/10/2015   Depression 04/20/2014   Generalized anxiety disorder 04/20/2014   Chronic back pain 04/20/2014   Chronic daily headache 04/20/2014   Essential hypertension 04/20/2014   Migraine headache 04/19/2014    Past Surgical History:  Procedure Laterality Date   FEMUR IM NAIL Left 06/02/2020   Procedure: INTRAMEDULLARY (IM) RETROGRADE FEMORAL NAILING - LEFT;  Surgeon: Rod Can, MD;  Location: [facility];  Service: Orthopedics;  Laterality: Left;   STOMACH SURGERY      Allergies Dihydrotachysterol, Trazodone and nefazodone, Valium [diazepam], Citalopram, and Other  Family History  Problem Relation Age of Onset   Migraines Father    Stroke Father    Heart Problems Mother    Epilepsy Brother     Social History Social History   Tobacco Use   Smoking status: Never   Smokeless tobacco: Never  Vaping Use   Vaping Use: Never used  Substance Use Topics  Alcohol use: No   Drug use: No    Review of Systems  Constitutional: No fever/chills. Positive generalized weakness.  Eyes: No visual changes. ENT: No sore throat. Cardiovascular: Denies chest pain. Respiratory: Denies shortness of breath. Gastrointestinal: No abdominal pain.  No nausea, no vomiting.  No diarrhea.  No constipation. Genitourinary: Negative for dysuria. Musculoskeletal: Negative for back pain. Skin: Negative for rash. Neurological: Negative for headaches, focal  weakness or numbness.  10-point ROS otherwise negative.  ____________________________________________   PHYSICAL EXAM:  VITAL SIGNS: ED Triage Vitals  Enc Vitals Group     BP 04/02/21 1501 (!) 87/53     Pulse Rate 04/02/21 1501 (!) 56     Resp 04/02/21 1501 16     Temp 04/02/21 1501 98 F (36.7 C)     Temp src --      SpO2 04/02/21 1454 100 %    Constitutional: Alert and oriented. Well appearing and in no acute distress. Eyes: Conjunctivae are normal. Head: Atraumatic. Nose: No congestion/rhinnorhea. Mouth/Throat: Mucous membranes are slightly dry.  Neck: No stridor.  Cardiovascular: Normal rate, regular rhythm. Good peripheral circulation. Grossly normal heart sounds.   Respiratory: Normal respiratory effort.  No retractions. Lungs CTAB. Gastrointestinal: Soft and nontender. No distention.  Musculoskeletal: No lower extremity tenderness nor edema. No gross deformities of extremities. Neurologic:  Normal speech and language. No gross focal neurologic deficits are appreciated.  Skin:  Skin is warm, dry and intact. No rash noted.  ____________________________________________   LABS (all labs ordered are listed, but only abnormal results are displayed)  Labs Reviewed  RESP PANEL BY RT-PCR (FLU A&B, COVID) ARPGX2 - Abnormal; Notable for the following components:      Result Value   SARS Coronavirus 2 by RT PCR POSITIVE (*)    All other components within normal limits  COMPREHENSIVE METABOLIC PANEL - Abnormal; Notable for the following components:   Glucose, Bld 106 (*)    Creatinine, Ser 1.05 (*)    Calcium 8.4 (*)    Total Protein 5.3 (*)    Albumin 2.9 (*)    Alkaline Phosphatase 36 (*)    GFR, Estimated 52 (*)    All other components within normal limits  CBC WITH DIFFERENTIAL/PLATELET - Abnormal; Notable for the following components:   RBC 3.18 (*)    Hemoglobin 10.3 (*)    HCT 32.0 (*)    MCV 100.6 (*)    All other components within normal limits  LIPASE,  BLOOD  LACTIC ACID, PLASMA  URINALYSIS, ROUTINE W REFLEX MICROSCOPIC  TROPONIN I (HIGH SENSITIVITY)  TROPONIN I (HIGH SENSITIVITY)   ____________________________________________  EKG   EKG Interpretation  Date/Time:  Tuesday April 02 2021 15:05:43 EDT Ventricular Rate:  58 PR Interval:  146 QRS Duration: 88 QT Interval:  478 QTC Calculation: 470 R Axis:   66 Text Interpretation: Sinus rhythm Nonspecific ST changes Confirmed by Nanda Quinton [phone]) on 04/02/2021 3:10:54 PM         ____________________________________________  RADIOLOGY  DG Chest Portable 1 View  Result Date: 04/02/2021 CLINICAL DATA:  Hypotension and dizziness EXAM: PORTABLE CHEST 1 VIEW COMPARISON:  June 02, 2020 FINDINGS: There is slight left base atelectasis. Lungs elsewhere are clear. Heart size and pulmonary vascularity are normal. No adenopathy. There is aortic atherosclerosis. No bone lesions. IMPRESSION: Left base atelectasis. No edema or airspace opacity. Heart size normal. Aortic Atherosclerosis (ICD10-I70.0). Electronically Signed   By: Lowella Grip III M.D.   On: 04/02/2021 15:58  ____________________________________________   PROCEDURES  Procedure(s) performed:   Procedures  CRITICAL CARE Performed by: Margette Fast Total critical care time: 35 minutes Critical care time was exclusive of separately billable procedures and treating other patients. Critical care was necessary to treat or prevent imminent or life-threatening deterioration. Critical care was time spent personally by me on the following activities: development of treatment plan with patient and/or surrogate as well as nursing, discussions with consultants, evaluation of patient's response to treatment, examination of patient, obtaining history from patient or surrogate, ordering and performing treatments and interventions, ordering and review of laboratory studies, ordering and review of radiographic studies, pulse  oximetry and re-evaluation of patient's condition.  Nanda Quinton, MD Emergency Medicine  ____________________________________________   INITIAL IMPRESSION / ASSESSMENT AND PLAN / ED COURSE  Pertinent labs & imaging results that were available during my care of the patient were reviewed by me and considered in my medical decision making (see chart for details).   Patient presents to the emergency department with fatigue, weakness, hypotension.  No fever or tachycardia.  Patient is awake and alert although feeling subjectively dizzy.  Does seem dehydrated clinically.  Patient also on medications for pain and muscle relaxation with chronic back pain and leg pain. Doubt sepsis, GI bleed, CP. Question medication issue vs dehydration. Despite BP patient is well appearing.   Patient's COVID test has come back positive. Not sure this explains hypotension which has been fluid responsive here. Apparently patient has had COVID recently (last several weeks) but had a negative pre-op test since. Only older tests in our system are from 2021 during my brief chart review.   06:25 PM  Blood pressures have made dramatic improvement.  Patient is awake, alert, feeling well.  Her son is at bedside and states that she is intermittently tested positive for COVID over the past several weeks.  She has no active symptoms.  Doubt acute infection or reinfection.  Following with the lab regarding her blood work results.  08:32 PM  Patient's lab work resulting showing no leukocytosis.  No acute kidney injury.  Troponin is within normal limits.  She has yet to give a urine sample but denies any dysuria, hesitancy, urgency.  Clinically she is doing much better.  Her blood pressure has remained normal to elevated after initial IV fluid bolus and has not required additional IV fluids.  Doubt sepsis or cardiogenic shock.  Patient is feeling much better and anxious to return home.  She has a follow-up appointment with her primary  care doctor in the coming week.  We discussed strict ED return precautions.  The patient's son is at bedside for the discussion.  Patient is comfortable with the plan at discharge.  ____________________________________________  FINAL CLINICAL IMPRESSION(S) / ED DIAGNOSES  Final diagnoses:  Dehydration  Hypotension, unspecified hypotension type    MEDICATIONS GIVEN DURING THIS VISIT:  Medications  sodium chloride 0.9 % bolus 1,000 mL (0 mLs Intravenous Stopped 04/02/21 1710)    Note:  This document was prepared using Dragon voice recognition software and may include unintentional dictation errors.  Nanda Quinton, MD, [facility] Emergency Medicine    Ronak Duquette, [facility], MD 04/02/21 2034

## 2021-04-02 NOTE — ED Notes (Signed)
Orthostatics:   Lying: 105/77 Sitting: 133/82 Standing: 167/85

## 2021-04-02 NOTE — ED Notes (Signed)
Pt sleeping, moderately difficult to awake post ativan administration. Pt to be ambulated once more awake per MD.

## 2021-04-02 NOTE — ED Notes (Signed)
Called lab to find out why labs are not in process at this time after being sent down at 1520

## 2021-04-02 NOTE — ED Notes (Signed)
Per lab, light green, lavender and blue tops can't be found in the lab. Will redraw labs.

## 2021-04-04 DIAGNOSIS — M5459 Other low back pain: Secondary | ICD-10-CM | POA: Diagnosis not present

## 2021-04-04 DIAGNOSIS — M25562 Pain in left knee: Secondary | ICD-10-CM | POA: Diagnosis not present

## 2021-04-04 DIAGNOSIS — M6281 Muscle weakness (generalized): Secondary | ICD-10-CM | POA: Diagnosis not present

## 2021-04-04 DIAGNOSIS — R262 Difficulty in walking, not elsewhere classified: Secondary | ICD-10-CM | POA: Diagnosis not present

## 2021-04-07 ENCOUNTER — Other Ambulatory Visit: Payer: Self-pay | Admitting: Family Medicine

## 2021-04-08 ENCOUNTER — Emergency Department (HOSPITAL_COMMUNITY): Payer: Medicare Other

## 2021-04-08 ENCOUNTER — Inpatient Hospital Stay (HOSPITAL_COMMUNITY)
Admission: EM | Admit: 2021-04-08 | Discharge: 2021-04-14 | DRG: 100 | Disposition: A | Discharge status: Home Health | Payer: Medicare Other | Source: Skilled Nursing Facility | Attending: Family Medicine | Admitting: Family Medicine

## 2021-04-08 DIAGNOSIS — Z66 Do not resuscitate: Secondary | ICD-10-CM | POA: Diagnosis present

## 2021-04-08 DIAGNOSIS — F32A Depression, unspecified: Secondary | ICD-10-CM | POA: Diagnosis not present

## 2021-04-08 DIAGNOSIS — Z1159 Encounter for screening for other viral diseases: Secondary | ICD-10-CM | POA: Diagnosis not present

## 2021-04-08 DIAGNOSIS — Z885 Allergy status to narcotic agent status: Secondary | ICD-10-CM

## 2021-04-08 DIAGNOSIS — Z820 Family history of epilepsy and other diseases of the nervous system: Secondary | ICD-10-CM

## 2021-04-08 DIAGNOSIS — B961 Klebsiella pneumoniae [K. pneumoniae] as the cause of diseases classified elsewhere: Secondary | ICD-10-CM | POA: Diagnosis present

## 2021-04-08 DIAGNOSIS — K219 Gastro-esophageal reflux disease without esophagitis: Secondary | ICD-10-CM

## 2021-04-08 DIAGNOSIS — E872 Acidosis, unspecified: Secondary | ICD-10-CM | POA: Diagnosis present

## 2021-04-08 DIAGNOSIS — U071 COVID-19: Secondary | ICD-10-CM | POA: Diagnosis present

## 2021-04-08 DIAGNOSIS — G4700 Insomnia, unspecified: Secondary | ICD-10-CM | POA: Diagnosis present

## 2021-04-08 DIAGNOSIS — Z1612 Extended spectrum beta lactamase (ESBL) resistance: Secondary | ICD-10-CM | POA: Diagnosis present

## 2021-04-08 DIAGNOSIS — Z20828 Contact with and (suspected) exposure to other viral communicable diseases: Secondary | ICD-10-CM | POA: Diagnosis not present

## 2021-04-08 DIAGNOSIS — R4701 Aphasia: Secondary | ICD-10-CM | POA: Diagnosis present

## 2021-04-08 DIAGNOSIS — G40901 Epilepsy, unspecified, not intractable, with status epilepticus: Principal | ICD-10-CM | POA: Diagnosis present

## 2021-04-08 DIAGNOSIS — B9689 Other specified bacterial agents as the cause of diseases classified elsewhere: Secondary | ICD-10-CM

## 2021-04-08 DIAGNOSIS — R000 Tachycardia, unspecified: Secondary | ICD-10-CM | POA: Diagnosis not present

## 2021-04-08 DIAGNOSIS — Z79899 Other long term (current) drug therapy: Secondary | ICD-10-CM

## 2021-04-08 DIAGNOSIS — F05 Delirium due to known physiological condition: Secondary | ICD-10-CM | POA: Diagnosis not present

## 2021-04-08 DIAGNOSIS — E559 Vitamin D deficiency, unspecified: Secondary | ICD-10-CM | POA: Diagnosis present

## 2021-04-08 DIAGNOSIS — F5104 Psychophysiologic insomnia: Secondary | ICD-10-CM | POA: Diagnosis present

## 2021-04-08 DIAGNOSIS — Z888 Allergy status to other drugs, medicaments and biological substances status: Secondary | ICD-10-CM

## 2021-04-08 DIAGNOSIS — I10 Essential (primary) hypertension: Secondary | ICD-10-CM | POA: Diagnosis present

## 2021-04-08 DIAGNOSIS — N390 Urinary tract infection, site not specified: Secondary | ICD-10-CM | POA: Diagnosis not present

## 2021-04-08 DIAGNOSIS — B9620 Unspecified Escherichia coli [E. coli] as the cause of diseases classified elsewhere: Secondary | ICD-10-CM | POA: Diagnosis present

## 2021-04-08 DIAGNOSIS — F411 Generalized anxiety disorder: Secondary | ICD-10-CM | POA: Diagnosis present

## 2021-04-08 DIAGNOSIS — G894 Chronic pain syndrome: Secondary | ICD-10-CM | POA: Diagnosis present

## 2021-04-08 DIAGNOSIS — K5900 Constipation, unspecified: Secondary | ICD-10-CM | POA: Diagnosis present

## 2021-04-08 DIAGNOSIS — R404 Transient alteration of awareness: Secondary | ICD-10-CM | POA: Diagnosis not present

## 2021-04-08 DIAGNOSIS — R4182 Altered mental status, unspecified: Secondary | ICD-10-CM

## 2021-04-08 DIAGNOSIS — J309 Allergic rhinitis, unspecified: Secondary | ICD-10-CM | POA: Diagnosis present

## 2021-04-08 DIAGNOSIS — R7989 Other specified abnormal findings of blood chemistry: Secondary | ICD-10-CM | POA: Diagnosis not present

## 2021-04-08 DIAGNOSIS — D320 Benign neoplasm of cerebral meninges: Secondary | ICD-10-CM | POA: Diagnosis present

## 2021-04-08 DIAGNOSIS — Q752 Hypertelorism: Secondary | ICD-10-CM

## 2021-04-08 DIAGNOSIS — D329 Benign neoplasm of meninges, unspecified: Secondary | ICD-10-CM | POA: Diagnosis present

## 2021-04-08 DIAGNOSIS — G9341 Metabolic encephalopathy: Secondary | ICD-10-CM | POA: Diagnosis not present

## 2021-04-08 DIAGNOSIS — G319 Degenerative disease of nervous system, unspecified: Secondary | ICD-10-CM | POA: Diagnosis not present

## 2021-04-08 DIAGNOSIS — R569 Unspecified convulsions: Secondary | ICD-10-CM

## 2021-04-08 DIAGNOSIS — R8290 Unspecified abnormal findings in urine: Secondary | ICD-10-CM | POA: Diagnosis present

## 2021-04-08 DIAGNOSIS — E876 Hypokalemia: Secondary | ICD-10-CM | POA: Diagnosis present

## 2021-04-08 DIAGNOSIS — R0602 Shortness of breath: Secondary | ICD-10-CM

## 2021-04-08 DIAGNOSIS — I160 Hypertensive urgency: Secondary | ICD-10-CM | POA: Diagnosis present

## 2021-04-08 DIAGNOSIS — Z823 Family history of stroke: Secondary | ICD-10-CM

## 2021-04-08 LAB — COMPREHENSIVE METABOLIC PANEL
ALT: 16 U/L (ref 0–44)
AST: 25 U/L (ref 15–41)
Albumin: 3.6 g/dL (ref 3.5–5.0)
Alkaline Phosphatase: 42 U/L (ref 38–126)
Anion gap: 14 (ref 5–15)
BUN: 13 mg/dL (ref 8–23)
CO2: 22 mmol/L (ref 22–32)
Calcium: 9.4 mg/dL (ref 8.9–10.3)
Chloride: 102 mmol/L (ref 98–111)
Creatinine, Ser: 0.94 mg/dL (ref 0.44–1.00)
GFR, Estimated: 60 mL/min — ABNORMAL LOW (ref >60)
Glucose, Bld: 150 mg/dL — ABNORMAL HIGH (ref 70–99)
Potassium: 4.2 mmol/L (ref 3.5–5.1)
Sodium: 138 mmol/L (ref 135–145)
Total Bilirubin: 0.8 mg/dL (ref 0.3–1.2)
Total Protein: 6.5 g/dL (ref 6.5–8.1)

## 2021-04-08 LAB — CBC WITH DIFFERENTIAL/PLATELET
Abs Immature Granulocytes: 0.04 10*3/uL (ref 0.00–0.07)
Basophils Absolute: 0 10*3/uL (ref 0.0–0.1)
Basophils Relative: 0 %
Eosinophils Absolute: 0 10*3/uL (ref 0.0–0.5)
Eosinophils Relative: 0 %
HCT: 38.4 % (ref 36.0–46.0)
Hemoglobin: 12.6 g/dL (ref 12.0–15.0)
Immature Granulocytes: 0 %
Lymphocytes Relative: 11 %
Lymphs Abs: 1.2 10*3/uL (ref 0.7–4.0)
MCH: 31.3 pg (ref 26.0–34.0)
MCHC: 32.8 g/dL (ref 30.0–36.0)
MCV: 95.5 fL (ref 80.0–100.0)
Monocytes Absolute: 0.6 10*3/uL (ref 0.1–1.0)
Monocytes Relative: 6 %
Neutro Abs: 8.3 10*3/uL — ABNORMAL HIGH (ref 1.7–7.7)
Neutrophils Relative %: 83 %
Platelets: 313 10*3/uL (ref 150–400)
RBC: 4.02 MIL/uL (ref 3.87–5.11)
RDW: 13.5 % (ref 11.5–15.5)
WBC: 10.1 10*3/uL (ref 4.0–10.5)
nRBC: 0 % (ref 0.0–0.2)

## 2021-04-08 LAB — LACTIC ACID, PLASMA
Lactic Acid, Venous: 2.2 mmol/L (ref 0.5–1.9)
Lactic Acid, Venous: 1.5 mmol/L (ref 0.5–1.9)

## 2021-04-08 LAB — PROTIME-INR
INR: 1.1 (ref 0.8–1.2)
Prothrombin Time: 13.7 seconds (ref 11.4–15.2)

## 2021-04-08 LAB — APTT: aPTT: 26 seconds (ref 24–36)

## 2021-04-08 MED ORDER — SODIUM CHLORIDE 0.9 % IV BOLUS
1000.0000 mL | Freq: Once | INTRAVENOUS | Status: AC
  Administered 2021-04-08: 1000 mL via INTRAVENOUS

## 2021-04-08 MED ORDER — LABETALOL HCL 5 MG/ML IV SOLN
20.0000 mg | Freq: Once | INTRAVENOUS | Status: AC
  Administered 2021-04-08: 20 mg via INTRAVENOUS
  Filled 2021-04-08: qty 4

## 2021-04-08 NOTE — ED Provider Notes (Signed)
Care assumed from Dr. Almyra Free, patient presented from [facility] with altered mental status, possible UTI, possible stroke.  Currently pending MRI of brain and urinalysis.  Anticipate need for hospital admission.  MRI of the brain showed no acute process.  Urinalysis suggest UTI with positive nitrite and many bacteria although no significant pyuria.  She is started on ceftriaxone.  Initial lactate elevation is improved.  Case is discussed with Dr. Cyd Silence of [facility] hospitalist, who agrees to admit the patient.  Results for orders placed or performed during the hospital encounter of 04/08/21  Lactic acid, plasma  Result Value Ref Range   Lactic Acid, Venous 2.2 (HH) 0.5 - 1.9 mmol/L  Lactic acid, plasma  Result Value Ref Range   Lactic Acid, Venous 1.5 0.5 - 1.9 mmol/L  Comprehensive metabolic panel  Result Value Ref Range   Sodium 138 135 - 145 mmol/L   Potassium 4.2 3.5 - 5.1 mmol/L   Chloride 102 98 - 111 mmol/L   CO2 22 22 - 32 mmol/L   Glucose, Bld 150 (H) 70 - 99 mg/dL   BUN 13 8 - 23 mg/dL   Creatinine, Ser 0.94 0.44 - 1.00 mg/dL   Calcium 9.4 8.9 - 10.3 mg/dL   Total Protein 6.5 6.5 - 8.1 g/dL   Albumin 3.6 3.5 - 5.0 g/dL   AST 25 15 - 41 U/L   ALT 16 0 - 44 U/L   Alkaline Phosphatase 42 38 - 126 U/L   Total Bilirubin 0.8 0.3 - 1.2 mg/dL   GFR, Estimated 60 (L) >60 mL/min   Anion gap 14 5 - 15  CBC WITH DIFFERENTIAL  Result Value Ref Range   WBC 10.1 4.0 - 10.5 K/uL   RBC 4.02 3.87 - 5.11 MIL/uL   Hemoglobin 12.6 12.0 - 15.0 g/dL   HCT 38.4 36.0 - 46.0 %   MCV 95.5 80.0 - 100.0 fL   MCH 31.3 26.0 - 34.0 pg   MCHC 32.8 30.0 - 36.0 g/dL   RDW 13.5 11.5 - 15.5 %   Platelets 313 150 - 400 K/uL   nRBC 0.0 0.0 - 0.2 %   Neutrophils Relative % 83 %   Neutro Abs 8.3 (H) 1.7 - 7.7 K/uL   Lymphocytes Relative 11 %   Lymphs Abs 1.2 0.7 - 4.0 K/uL   Monocytes Relative 6 %   Monocytes Absolute 0.6 0.1 - 1.0 K/uL   Eosinophils Relative 0 %   Eosinophils Absolute 0.0 0.0 - 0.5  K/uL   Basophils Relative 0 %   Basophils Absolute 0.0 0.0 - 0.1 K/uL   Immature Granulocytes 0 %   Abs Immature Granulocytes 0.04 0.00 - 0.07 K/uL  Protime-INR  Result Value Ref Range   Prothrombin Time 13.7 11.4 - 15.2 seconds   INR 1.1 0.8 - 1.2  APTT  Result Value Ref Range   aPTT 26 24 - 36 seconds  Urinalysis, Routine w reflex microscopic Urine, Catheterized  Result Value Ref Range   Color, Urine YELLOW YELLOW   APPearance CLOUDY (A) CLEAR   Specific Gravity, Urine 1.015 1.005 - 1.030   pH 6.0 5.0 - 8.0   Glucose, UA NEGATIVE NEGATIVE mg/dL   Hgb urine dipstick NEGATIVE NEGATIVE   Bilirubin Urine NEGATIVE NEGATIVE   Ketones, ur 20 (A) NEGATIVE mg/dL   Protein, ur NEGATIVE NEGATIVE mg/dL   Nitrite POSITIVE (A) NEGATIVE   Leukocytes,Ua NEGATIVE NEGATIVE   RBC / HPF 0-5 0 - 5 RBC/hpf   WBC, UA  0-5 0 - 5 WBC/hpf   Bacteria, UA MANY (A) NONE SEEN   Squamous Epithelial / LPF 6-10 0 - 5   CT Head Wo Contrast  Result Date: 04/08/2021 CLINICAL DATA:  Mental status change EXAM: CT HEAD WITHOUT CONTRAST TECHNIQUE: Contiguous axial images were obtained from the base of the skull through the vertex without intravenous contrast. COMPARISON:  CT brain 06/02/2020 FINDINGS: Brain: No acute territorial infarction, hemorrhage or new intracranial mass. Right parietal convexity calcified meningioma. Mild atrophy. Mild to moderate chronic small vessel ischemic change of the white matter. Stable ventricle size. Vascular: No hyperdense vessels.  Carotid vascular calcification Skull: Normal. Negative for fracture or focal lesion. Sinuses/Orbits: Patchy mucosal disease in the ethmoid sinus Other: None IMPRESSION: 1. No CT evidence for acute intracranial abnormality. 2. Atrophy and chronic small vessel ischemic change of the white matter Electronically Signed   By: Donavan Foil M.D.   On: 04/08/2021 19:05   MR BRAIN WO CONTRAST  Result Date: 04/09/2021 CLINICAL DATA:  Initial evaluation for acute  mental status change. EXAM: MRI HEAD WITHOUT CONTRAST TECHNIQUE: Multiplanar, multiecho pulse sequences of the brain and surrounding structures were obtained without intravenous contrast. COMPARISON:  Prior head CT from 04/08/2021. FINDINGS: Brain: Cerebral volume within normal limits for age. Patchy T2/FLAIR hyperintensity within the periventricular and deep white matter both cerebral hemispheres most consistent with chronic small vessel ischemic disease, mild for age. No abnormal foci of restricted diffusion to suggest acute or subacute ischemia. Gray-white matter differentiation maintained. No encephalomalacia to suggest chronic cortical infarction. No evidence for acute or chronic intracranial hemorrhage. 1 cm calcified meningioma overlying the right parietal convexity again noted, relatively stable from previous. No associated mass effect. No other mass lesion, midline shift or mass effect. No hydrocephalus or extra-axial fluid collection. Pituitary gland suprasellar region within normal limits. Midline structures intact. Vascular: Major intracranial vascular flow voids are maintained. Skull and upper cervical spine: Craniocervical junction within normal limits. Bone marrow signal intensity normal. No scalp soft tissue abnormality. Sinuses/Orbits: Patient status post bilateral ocular lens replacement. Globes and orbital soft tissues demonstrate no acute finding. Scattered mucosal thickening noted within the ethmoidal air cells. Paranasal sinuses are otherwise largely clear. No mastoid effusion. Inner ear structures grossly normal. Other: None. IMPRESSION: 1. No acute intracranial abnormality. 2. Mild age-related cerebral atrophy with chronic microvascular ischemic disease. 3. 1 cm calcified meningioma overlying the right parietal convexity without associated mass effect, stable. Electronically Signed   By: Jeannine Boga M.D.   On: 04/09/2021 01:38   DG Chest Port 1 View  Result Date:  04/08/2021 CLINICAL DATA:  Pt arrives via [facility] from [place] c/o AMS and possible UTI. Pt has no hx of memory issues EXAM: PORTABLE CHEST 1 VIEW COMPARISON:  04/02/2021. FINDINGS: Cardiac silhouette partly obscured but grossly normal in size. No mediastinal or hilar masses. Bilateral prominent interstitial markings. Additional opacity at the left lung base is noted consistent with atelectasis with a possible small effusion. Lungs otherwise clear. No convincing right pleural effusion and no pneumothorax. Skeletal structures are grossly intact. IMPRESSION: No acute cardiopulmonary disease. Electronically Signed   By: Lajean Manes M.D.   On: 04/08/2021 18:19   DG Chest Portable 1 View  Result Date: 04/02/2021 CLINICAL DATA:  Hypotension and dizziness EXAM: PORTABLE CHEST 1 VIEW COMPARISON:  June 02, 2020 FINDINGS: There is slight left base atelectasis. Lungs elsewhere are clear. Heart size and pulmonary vascularity are normal. No adenopathy. There is aortic atherosclerosis. No bone  lesions. IMPRESSION: Left base atelectasis. No edema or airspace opacity. Heart size normal. Aortic Atherosclerosis (ICD10-I70.0). Electronically Signed   By: Lowella Grip III M.D.   On: 04/02/2021 15:58    Images viewed by me.    Delora Fuel, MD 34/94/94 [phone]

## 2021-04-08 NOTE — ED Provider Notes (Signed)
[facility] EMERGENCY DEPARTMENT Provider Note   CSN: [id number] Arrival date & time: 04/08/21  1730     History No chief complaint on file.   Tracey Armstrong is a 86 y.o. female.  Patient presents to the ER chief complaint of confusion.  Reportedly lives at a assisted living facility.  She was noted by caregiver to be more confused than normal today.  Having memory issues and decreased activity.  No additional reports of fevers or cough or vomiting or diarrhea.  Patient herself denies having any pain.  Caregiver states patient was noted to be confused today and unknown when the patient was last known well.      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   Synovial cyst of lumbar facet joint 04/05/2020   Spinal stenosis of lumbar region with neurogenic claudication 04/05/2020   Dehydration 04/05/2020   Closed wedge compression fracture of L1 vertebra (HCC) 04/04/2020   Ambulatory dysfunction 04/04/2020   Intractable low back pain 04/04/2020   L1 vertebral fracture (HCC) 04/04/2020   Urine, incontinence, stress female 02/07/2019   Knee osteoarthritis 02/01/2018   Iron deficiency anemia 10/28/2017   Rhinitis, allergic 07/27/2017   GERD (gastroesophageal reflux disease) 06/16/2017   Chronic pain disorder 06/16/2017   Vitamin D deficiency, unspecified 05/25/2017   Hyperlipidemia 05/25/2017   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   Pain 02/26/2017   Chronic low back pain 12/02/2016   Abdominal pain 03/26/2016   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   Peptic esophagitis 03/26/2016    Neuralgia neuritis, sciatic nerve 03/26/2016   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     Family History  Problem Relation Age of Onset   Migraines Father    Stroke Father    Heart Problems Mother    Epilepsy Brother     Social History   Tobacco Use   Smoking status: Never   Smokeless tobacco: Never  Vaping Use   Vaping Use: Never used  Substance Use Topics   Alcohol use: No   Drug use: No    Home Medications Prior to Admission medications   Medication Sig Start Date End Date Taking? Authorizing Provider  ALPRAZolam (XANAX) 1 MG tablet TAKE 1/2 TABLET BY MOUTH AT NOON AND 1 TABLET AT BEDTIME Patient taking differently: Take 1.5 mg by mouth daily as needed for anxiety. 03/01/21  Yes Billie Ruddy, MD  antiseptic oral rinse (BIOTENE) LIQD  Use 3 mls 3-4 times per day for dry mouth. Do not swallow. 12/26/20  Yes Martinique, [name], MD  Cyanocobalamin (VITAMIN B-12 PO) Take 1 tablet by mouth daily with lunch.   Yes [provider]  Dextran 70-Hypromellose (ARTIFICIAL TEARS PF OP) Place 1-2 drops into both eyes See admin instructions. Instill 1-2 drops into both eyes at bedtime and an additional 2-3 times a day as needed for dryness   Yes [provider]  diphenhydramine-acetaminophen (TYLENOL PM) 25-500 MG TABS tablet Take 1.5 tablets by mouth at bedtime.   Yes [provider]   gabapentin (NEURONTIN) 300 MG capsule Take 1 capsule (300 mg total) by mouth 2 (two) times daily. 12/12/20  Yes Martinique, [name], MD  Magnesium 400 MG TABS Take 400 mg by mouth at bedtime.   Yes [provider]  MAXALT-MLT 10 MG disintegrating tablet DISSOLVE ONE TABLET BY MOUTH AS NEEDED HEADACHE Patient taking differently: Take 10 mg by mouth daily as needed for migraine (or headaches- dissolve orally). 05/31/19  Yes Martin[place], [name], MD  Melatonin 10 MG TABS Take 10 mg by mouth at bedtime as needed (for sleep).   Yes [provider]  omeprazole (PRILOSEC OTC) 20 MG tablet Take 20 mg by mouth daily before breakfast.   Yes [provider]  oxybutynin (DITROPAN-XL) 10 MG 24 hr tablet TAKE 1 TABLET(10 MG) BY MOUTH AT BEDTIME Patient taking differently: Take 10 mg by mouth at bedtime. 02/26/21  Yes [place], [name], MD  riboflavin (VITAMIN B-2) 100 MG TABS tablet Take 400 mg by mouth daily with lunch.   Yes [provider]  senna (SENOKOT) 8.6 MG TABS tablet Take 1 tablet (8.6 mg total) by mouth 2 (two) times daily. Patient taking differently: Take 1 tablet by mouth daily. 06/06/20  Yes Amin, Ankit Chirag, MD  tiZANidine (ZANAFLEX) 4 MG tablet TAKE 1 TABLET(4 MG) BY MOUTH TWICE DAILY AS NEEDED Patient taking differently: Take 4 mg by mouth 2 (two) times daily as needed for muscle spasms. 04/08/21  Yes [place], [name], MD  tretinoin (RETIN-A) 0.1 % cream APPLY TO AFFECTED AREA EVERY DAY AT BEDTIME Patient taking differently: Apply 1 application topically at bedtime as needed (brown spots). 09/22/18  Yes [place], [name], MD  verapamil (VERELAN PM) 180 MG 24 hr capsule TAKE 1 CAPSULE(180 MG) BY MOUTH DAILY Patient taking differently: Take 180 mg by mouth at bedtime. 02/06/21  Yes [place], [name], MD  Vitamin D, Ergocalciferol, (DRISDOL) 1.25 MG (50000 UNIT) CAPS capsule TAKE 1 CAPSULE BY MOUTH EVERY 2 WEEKS Patient taking differently: Take 50,000 Units by mouth See admin  instructions. Take 50,000 units by mouth every other Sunday 10/05/20  Yes [place], [name], MD  acetaminophen (TYLENOL) 650 MG CR tablet Take 975 mg by mouth daily.    [provider]  docusate sodium (COLACE) 100 MG capsule Take 1 capsule (100 mg total) by mouth 2 (two) times daily. 06/06/20   Amin, Jeanella Flattery, MD  POTASSIUM PO Take 1 tablet by mouth daily with lunch.    [provider]  pyridOXINE (VITAMIN B-6) 100 MG tablet Take 400 mg by mouth daily with lunch.    [provider]    Allergies    Dihydrotachysterol, Trazodone and nefazodone, Valium [diazepam], Citalopram, and Other  Review of Systems   Review of Systems  Constitutional:  Negative for fever.  HENT:  Negative for ear pain.   Eyes:  Negative for pain.  Respiratory:  Negative for cough.   Cardiovascular:  Negative for chest pain.  Gastrointestinal:  Negative for abdominal pain.  Genitourinary:  Negative for flank pain.  Musculoskeletal:  Negative for back pain.  Skin:  Negative for rash.  Neurological:  Negative for headaches.   Physical Exam Updated Vital Signs BP (!) 186/100   Pulse 90   Temp 98.8 F (37.1 C) (Oral)   Resp (!) 24   Ht 5\' 2"  (1.575 m)   Wt 62 kg   SpO2 94%   BMI 25.00 kg/m   Physical Exam Constitutional:      General: She is not in acute distress.    Appearance: Normal appearance.  HENT:     Head: Normocephalic.     Nose: Nose normal.  Eyes:     Extraocular Movements: Extraocular movements intact.  Cardiovascular:     Rate and Rhythm: Normal rate.  Pulmonary:     Effort: Pulmonary effort is normal.  Musculoskeletal:        General: Normal range of motion.     Cervical back: Normal range of motion.  Neurological:     Mental Status: She is alert.     Comments: When asked any questions the patient appears very confused.  She does attempt to follow commands.  But when I ask what year it is she does not answer, when I ask where she is, she is able to state  that she is in the hospital initially, however afterwards she appears confused and cannot recall where she is.  When asked what she used to do for living patient mumbles incoherently.  She does appear to be moving all extremities although she appears to have generalized weakness in all extremities.    ED Results / Procedures / Treatments   Labs (all labs ordered are listed, but only abnormal results are displayed) Labs Reviewed  LACTIC ACID, PLASMA - Abnormal; Notable for the following components:      Result Value   Lactic Acid, Venous 2.2 (*)    All other components within normal limits  COMPREHENSIVE METABOLIC PANEL - Abnormal; Notable for the following components:   Glucose, Bld 150 (*)    GFR, Estimated 60 (*)    All other components within normal limits  CBC WITH DIFFERENTIAL/PLATELET - Abnormal; Notable for the following components:   Neutro Abs 8.3 (*)    All other components within normal limits  CULTURE, BLOOD (SINGLE)  URINE CULTURE  PROTIME-INR  APTT  LACTIC ACID, PLASMA  URINALYSIS, ROUTINE W REFLEX MICROSCOPIC    EKG EKG Interpretation  Date/Time:  Monday April 08 2021 19:34:27 EDT Ventricular Rate:  106 PR Interval:    QRS Duration: 80 QT Interval:  350 QTC Calculation: 465 R Axis:   73 Text Interpretation: sinus tachycardia, No ST elevation, No ST depression Confirmed by Thamas Jaegers ([id number]) on 04/08/2021 8:19:34 PM  Radiology CT Head Wo Contrast  Result Date: 04/08/2021 CLINICAL DATA:  Mental status change EXAM: CT HEAD WITHOUT CONTRAST TECHNIQUE: Contiguous axial images were obtained from the base of the skull through the vertex without intravenous contrast. COMPARISON:  CT brain 06/02/2020 FINDINGS: Brain: No acute territorial infarction, hemorrhage or new intracranial mass. Right parietal convexity calcified meningioma. Mild atrophy. Mild to moderate chronic small vessel ischemic change of the white matter. Stable ventricle size. Vascular: No hyperdense  vessels.  Carotid vascular calcification Skull: Normal. Negative for fracture or focal lesion. Sinuses/Orbits: Patchy mucosal disease in the ethmoid sinus Other: None IMPRESSION: 1. No CT evidence  for acute intracranial abnormality. 2. Atrophy and chronic small vessel ischemic change of the white matter Electronically Signed   By: Donavan Foil M.D.   On: 04/08/2021 19:05   DG Chest Port 1 View  Result Date: 04/08/2021 CLINICAL DATA:  Pt arrives via [facility] from [facility] c/o AMS and possible UTI. Pt has no hx of memory issues EXAM: PORTABLE CHEST 1 VIEW COMPARISON:  04/02/2021. FINDINGS: Cardiac silhouette partly obscured but grossly normal in size. No mediastinal or hilar masses. Bilateral prominent interstitial markings. Additional opacity at the left lung base is noted consistent with atelectasis with a possible small effusion. Lungs otherwise clear. No convincing right pleural effusion and no pneumothorax. Skeletal structures are grossly intact. IMPRESSION: No acute cardiopulmonary disease. Electronically Signed   By: Lajean Manes M.D.   On: 04/08/2021 18:19    Procedures Procedures   Medications Ordered in ED Medications  labetalol (NORMODYNE) injection 20 mg (20 mg Intravenous Given 04/08/21 2018)  sodium chloride 0.9 % bolus 1,000 mL (1,000 mLs Intravenous New Bag/Given 04/08/21 2019)    ED Course  I have reviewed the triage vital signs and the nursing notes.  Pertinent labs & imaging results that were available during my care of the patient were reviewed by me and considered in my medical decision making (see chart for details).    MDM Rules/Calculators/A&P                          Initially anticipated possible UTI induced confusion.  However patient has been afebrile labs are unremarkable and she is yet to produce urinalysis.  CT of the brain is unremarkable no acute changes.  Given neuro findings today pursuing MRI for possible strokelike event.   Final  Clinical Impression(s) / ED Diagnoses Final diagnoses:  None    Rx / DC Orders ED Discharge Orders     None        Luna Fuse, MD 04/08/21 2244

## 2021-04-08 NOTE — ED Triage Notes (Signed)
Pt arrives via [facility] from [place] c/o AMS and possible UTI. Pt has no hx of memory issues, for EMS GCS 14, A+Ox2 (alert to person and place).   EMS last VS - 192/120, HR 120, RR 16, 100% on RA, CBG 186, temp 98.1.   #20 L AC by EMS.

## 2021-04-09 ENCOUNTER — Emergency Department (HOSPITAL_COMMUNITY): Payer: Medicare Other

## 2021-04-09 ENCOUNTER — Inpatient Hospital Stay (HOSPITAL_COMMUNITY): Payer: Medicare Other

## 2021-04-09 ENCOUNTER — Encounter (HOSPITAL_COMMUNITY): Payer: Self-pay | Admitting: Internal Medicine

## 2021-04-09 DIAGNOSIS — I160 Hypertensive urgency: Secondary | ICD-10-CM | POA: Diagnosis present

## 2021-04-09 DIAGNOSIS — I639 Cerebral infarction, unspecified: Secondary | ICD-10-CM | POA: Diagnosis not present

## 2021-04-09 DIAGNOSIS — I63233 Cerebral infarction due to unspecified occlusion or stenosis of bilateral carotid arteries: Secondary | ICD-10-CM | POA: Diagnosis not present

## 2021-04-09 DIAGNOSIS — F32A Depression, unspecified: Secondary | ICD-10-CM | POA: Diagnosis present

## 2021-04-09 DIAGNOSIS — R109 Unspecified abdominal pain: Secondary | ICD-10-CM | POA: Diagnosis not present

## 2021-04-09 DIAGNOSIS — E872 Acidosis, unspecified: Secondary | ICD-10-CM | POA: Diagnosis present

## 2021-04-09 DIAGNOSIS — G319 Degenerative disease of nervous system, unspecified: Secondary | ICD-10-CM | POA: Diagnosis not present

## 2021-04-09 DIAGNOSIS — D329 Benign neoplasm of meninges, unspecified: Secondary | ICD-10-CM

## 2021-04-09 DIAGNOSIS — G894 Chronic pain syndrome: Secondary | ICD-10-CM | POA: Diagnosis present

## 2021-04-09 DIAGNOSIS — I10 Essential (primary) hypertension: Secondary | ICD-10-CM | POA: Diagnosis present

## 2021-04-09 DIAGNOSIS — R4182 Altered mental status, unspecified: Secondary | ICD-10-CM | POA: Diagnosis not present

## 2021-04-09 DIAGNOSIS — G9341 Metabolic encephalopathy: Secondary | ICD-10-CM | POA: Diagnosis present

## 2021-04-09 DIAGNOSIS — Z66 Do not resuscitate: Secondary | ICD-10-CM | POA: Diagnosis present

## 2021-04-09 DIAGNOSIS — R8290 Unspecified abnormal findings in urine: Secondary | ICD-10-CM

## 2021-04-09 DIAGNOSIS — U071 COVID-19: Secondary | ICD-10-CM | POA: Diagnosis present

## 2021-04-09 DIAGNOSIS — K5900 Constipation, unspecified: Secondary | ICD-10-CM | POA: Diagnosis present

## 2021-04-09 DIAGNOSIS — R569 Unspecified convulsions: Secondary | ICD-10-CM | POA: Diagnosis not present

## 2021-04-09 DIAGNOSIS — B9620 Unspecified Escherichia coli [E. coli] as the cause of diseases classified elsewhere: Secondary | ICD-10-CM | POA: Diagnosis present

## 2021-04-09 DIAGNOSIS — F5104 Psychophysiologic insomnia: Secondary | ICD-10-CM | POA: Diagnosis present

## 2021-04-09 DIAGNOSIS — K219 Gastro-esophageal reflux disease without esophagitis: Secondary | ICD-10-CM | POA: Diagnosis present

## 2021-04-09 DIAGNOSIS — R4701 Aphasia: Secondary | ICD-10-CM | POA: Diagnosis present

## 2021-04-09 DIAGNOSIS — D320 Benign neoplasm of cerebral meninges: Secondary | ICD-10-CM | POA: Diagnosis present

## 2021-04-09 DIAGNOSIS — R0602 Shortness of breath: Secondary | ICD-10-CM | POA: Diagnosis not present

## 2021-04-09 DIAGNOSIS — E876 Hypokalemia: Secondary | ICD-10-CM | POA: Diagnosis present

## 2021-04-09 DIAGNOSIS — Z1612 Extended spectrum beta lactamase (ESBL) resistance: Secondary | ICD-10-CM | POA: Diagnosis present

## 2021-04-09 DIAGNOSIS — G4700 Insomnia, unspecified: Secondary | ICD-10-CM | POA: Diagnosis present

## 2021-04-09 DIAGNOSIS — I6523 Occlusion and stenosis of bilateral carotid arteries: Secondary | ICD-10-CM | POA: Diagnosis not present

## 2021-04-09 DIAGNOSIS — F411 Generalized anxiety disorder: Secondary | ICD-10-CM | POA: Diagnosis present

## 2021-04-09 DIAGNOSIS — N390 Urinary tract infection, site not specified: Secondary | ICD-10-CM | POA: Diagnosis present

## 2021-04-09 DIAGNOSIS — B961 Klebsiella pneumoniae [K. pneumoniae] as the cause of diseases classified elsewhere: Secondary | ICD-10-CM | POA: Diagnosis present

## 2021-04-09 DIAGNOSIS — E559 Vitamin D deficiency, unspecified: Secondary | ICD-10-CM | POA: Diagnosis present

## 2021-04-09 DIAGNOSIS — Q752 Hypertelorism: Secondary | ICD-10-CM | POA: Diagnosis not present

## 2021-04-09 DIAGNOSIS — G40901 Epilepsy, unspecified, not intractable, with status epilepticus: Secondary | ICD-10-CM | POA: Diagnosis present

## 2021-04-09 DIAGNOSIS — F05 Delirium due to known physiological condition: Secondary | ICD-10-CM | POA: Diagnosis not present

## 2021-04-09 DIAGNOSIS — I6389 Other cerebral infarction: Secondary | ICD-10-CM | POA: Diagnosis not present

## 2021-04-09 LAB — COMPREHENSIVE METABOLIC PANEL
ALT: 14 U/L (ref 0–44)
AST: 21 U/L (ref 15–41)
Albumin: 3.2 g/dL — ABNORMAL LOW (ref 3.5–5.0)
Alkaline Phosphatase: 41 U/L (ref 38–126)
Anion gap: 9 (ref 5–15)
BUN: 12 mg/dL (ref 8–23)
CO2: 23 mmol/L (ref 22–32)
Calcium: 8.9 mg/dL (ref 8.9–10.3)
Chloride: 104 mmol/L (ref 98–111)
Creatinine, Ser: 0.86 mg/dL (ref 0.44–1.00)
GFR, Estimated: >60 mL/min (ref >60)
Glucose, Bld: 143 mg/dL — ABNORMAL HIGH (ref 70–99)
Potassium: 4 mmol/L (ref 3.5–5.1)
Sodium: 136 mmol/L (ref 135–145)
Total Bilirubin: 0.5 mg/dL (ref 0.3–1.2)
Total Protein: 6.1 g/dL — ABNORMAL LOW (ref 6.5–8.1)

## 2021-04-09 LAB — URINALYSIS, ROUTINE W REFLEX MICROSCOPIC
Bilirubin Urine: NEGATIVE
Glucose, UA: NEGATIVE mg/dL
Hgb urine dipstick: NEGATIVE
Ketones, ur: 20 mg/dL — AB
Leukocytes,Ua: NEGATIVE
Nitrite: POSITIVE — AB
Protein, ur: NEGATIVE mg/dL
Specific Gravity, Urine: 1.015 (ref 1.005–1.030)
pH: 6 (ref 5.0–8.0)

## 2021-04-09 LAB — CBC WITH DIFFERENTIAL/PLATELET
Abs Immature Granulocytes: 0.03 10*3/uL (ref 0.00–0.07)
Basophils Absolute: 0 10*3/uL (ref 0.0–0.1)
Basophils Relative: 0 %
Eosinophils Absolute: 0 10*3/uL (ref 0.0–0.5)
Eosinophils Relative: 0 %
HCT: 36.1 % (ref 36.0–46.0)
Hemoglobin: 11.8 g/dL — ABNORMAL LOW (ref 12.0–15.0)
Immature Granulocytes: 0 %
Lymphocytes Relative: 20 %
Lymphs Abs: 1.7 10*3/uL (ref 0.7–4.0)
MCH: 31.6 pg (ref 26.0–34.0)
MCHC: 32.7 g/dL (ref 30.0–36.0)
MCV: 96.8 fL (ref 80.0–100.0)
Monocytes Absolute: 0.6 10*3/uL (ref 0.1–1.0)
Monocytes Relative: 7 %
Neutro Abs: 6.1 10*3/uL (ref 1.7–7.7)
Neutrophils Relative %: 73 %
Platelets: 270 10*3/uL (ref 150–400)
RBC: 3.73 MIL/uL — ABNORMAL LOW (ref 3.87–5.11)
RDW: 13.5 % (ref 11.5–15.5)
WBC: 8.4 10*3/uL (ref 4.0–10.5)
nRBC: 0 % (ref 0.0–0.2)

## 2021-04-09 LAB — VITAMIN B12: Vitamin B-12: 1454 pg/mL — ABNORMAL HIGH (ref 180–914)

## 2021-04-09 LAB — FOLATE: Folate: 5.2 ng/mL — ABNORMAL LOW (ref >5.9)

## 2021-04-09 LAB — MAGNESIUM: Magnesium: 1.8 mg/dL (ref 1.7–2.4)

## 2021-04-09 LAB — PROCALCITONIN: Procalcitonin: <0.1 ng/mL

## 2021-04-09 LAB — TSH: TSH: 1.163 u[IU]/mL (ref 0.350–4.500)

## 2021-04-09 LAB — C-REACTIVE PROTEIN: CRP: 0.6 mg/dL (ref <1.0)

## 2021-04-09 MED ORDER — DOCUSATE SODIUM 100 MG PO CAPS
100.0000 mg | ORAL_CAPSULE | Freq: Two times a day (BID) | ORAL | Status: DC
  Administered 2021-04-09 – 2021-04-10 (×3): 100 mg via ORAL
  Filled 2021-04-09 (×4): qty 1

## 2021-04-09 MED ORDER — OXYBUTYNIN CHLORIDE ER 5 MG PO TB24
10.0000 mg | ORAL_TABLET | Freq: Every day | ORAL | Status: DC
  Administered 2021-04-10 – 2021-04-13 (×5): 10 mg via ORAL
  Filled 2021-04-09: qty 1
  Filled 2021-04-09: qty 2
  Filled 2021-04-09 (×6): qty 1

## 2021-04-09 MED ORDER — SODIUM CHLORIDE 0.9 % IV SOLN: INTRAVENOUS | Status: DC

## 2021-04-09 MED ORDER — SODIUM CHLORIDE 0.9 % IV SOLN
2.0000 g | INTRAVENOUS | Status: AC
  Administered 2021-04-10 – 2021-04-11 (×2): 2 g via INTRAVENOUS
  Filled 2021-04-09 (×2): qty 20

## 2021-04-09 MED ORDER — LABETALOL HCL 5 MG/ML IV SOLN
20.0000 mg | Freq: Once | INTRAVENOUS | Status: AC
  Administered 2021-04-09: 20 mg via INTRAVENOUS
  Filled 2021-04-09: qty 4

## 2021-04-09 MED ORDER — ENOXAPARIN SODIUM 40 MG/0.4ML IJ SOSY
40.0000 mg | PREFILLED_SYRINGE | Freq: Every day | INTRAMUSCULAR | Status: DC
  Administered 2021-04-09 – 2021-04-11 (×3): 40 mg via SUBCUTANEOUS
  Filled 2021-04-09 (×3): qty 0.4

## 2021-04-09 MED ORDER — ACETAMINOPHEN 325 MG PO TABS
650.0000 mg | ORAL_TABLET | Freq: Once | ORAL | Status: AC
  Administered 2021-04-09: 650 mg via ORAL
  Filled 2021-04-09: qty 2

## 2021-04-09 MED ORDER — POLYETHYLENE GLYCOL 3350 17 G PO PACK
17.0000 g | PACK | Freq: Every day | ORAL | Status: DC
  Administered 2021-04-09 – 2021-04-14 (×4): 17 g via ORAL
  Filled 2021-04-09 (×6): qty 1

## 2021-04-09 MED ORDER — ACETAMINOPHEN 325 MG PO TABS
650.0000 mg | ORAL_TABLET | ORAL | Status: DC | PRN
  Administered 2021-04-09 – 2021-04-10 (×4): 650 mg via ORAL
  Filled 2021-04-09 (×4): qty 2

## 2021-04-09 MED ORDER — VERAPAMIL HCL ER 180 MG PO TBCR: 180.0000 mg | EXTENDED_RELEASE_TABLET | Freq: Every day | ORAL | Status: DC

## 2021-04-09 MED ORDER — IOHEXOL 300 MG/ML  SOLN
100.0000 mL | Freq: Once | INTRAMUSCULAR | Status: AC
  Administered 2021-04-09: 75 mL via INTRAVENOUS

## 2021-04-09 MED ORDER — HYDRALAZINE HCL 20 MG/ML IJ SOLN
10.0000 mg | Freq: Four times a day (QID) | INTRAMUSCULAR | Status: DC | PRN
  Administered 2021-04-09 (×2): 10 mg via INTRAVENOUS
  Filled 2021-04-09 (×2): qty 1

## 2021-04-09 MED ORDER — PANTOPRAZOLE SODIUM 40 MG PO TBEC
40.0000 mg | DELAYED_RELEASE_TABLET | Freq: Every day | ORAL | Status: DC
  Administered 2021-04-09 – 2021-04-11 (×3): 40 mg via ORAL
  Filled 2021-04-09 (×3): qty 1

## 2021-04-09 MED ORDER — SENNA 8.6 MG PO TABS
1.0000 | ORAL_TABLET | Freq: Two times a day (BID) | ORAL | Status: DC
  Filled 2021-04-09: qty 1

## 2021-04-09 MED ORDER — VERAPAMIL HCL ER 120 MG PO TBCR
120.0000 mg | EXTENDED_RELEASE_TABLET | Freq: Every day | ORAL | Status: DC
  Administered 2021-04-09 – 2021-04-11 (×3): 120 mg via ORAL
  Filled 2021-04-09 (×3): qty 1

## 2021-04-09 MED ORDER — SENNOSIDES-DOCUSATE SODIUM 8.6-50 MG PO TABS
1.0000 | ORAL_TABLET | Freq: Two times a day (BID) | ORAL | Status: DC
  Administered 2021-04-09 – 2021-04-14 (×10): 1 via ORAL
  Filled 2021-04-09 (×11): qty 1

## 2021-04-09 MED ORDER — SODIUM CHLORIDE 0.9 % IV SOLN
2.0000 g | Freq: Once | INTRAVENOUS | Status: AC
  Administered 2021-04-09: 2 g via INTRAVENOUS
  Filled 2021-04-09: qty 20

## 2021-04-09 NOTE — Plan of Care (Signed)
Seen and rounded on in ER this morning. No charge for note. 86 yo female admitted with AMS. Per son she's typically Aox3. In ER she was oriented to name, president, year. Did not know where she was (thought [place] still).  Only acute complaint seemed to be some lower abdominal pain.  UA was notable for having nitrites.  She did not complain of obvious urinary symptoms otherwise.  She was started on Rocephin for empiric treatment of UTI on admission which may possibly be causing some of her encephalopathy as well. Blood pressure was also significantly elevated and her son had reported that she self discontinued her antihypertensives.  Medications were resumed on admission.  Plan: - s/p CT A/P: shows urinary bladder wall thickening consistent with probable cystitis (and possibly pyelo?), agree with continuing Rocephin and follow-up urine culture.  Also shows moderate stool and concern for stercoral colits.  - start bowel regimen - monitor for mentation improvement  - continue BP control  - Incidental COVID, asymptomatic. No treatment indicated. CT also >30  Dwyane Dee, MD [facility] 04/09/2021, 1:36 PM

## 2021-04-09 NOTE — ED Notes (Signed)
Patient transported to CT 

## 2021-04-09 NOTE — H&P (Signed)
History and Physical    Tracey Armstrong ONG:[id number] DOB: 1935-01-27 DOA: 04/08/2021  PCP: Martinique, [name], MD  Patient coming from: [place]   Chief Complaint: confusion    HPI:    86 year old female with past medical history of depression, gastroesophageal reflux disease, anemia, hypertension and chronic pain syndrome who presents to [facility] emergency department via EMS from her independent living facility due to concerns for confusion.  Patient is an extremely poor historian and therefore majority the history has been obtained from the son via phone conversation.  Since I explains that patient has no history of dementia or memory issues at baseline.  Patient is typically AAO x3.  Son explains that approximately 1 weeks ago, patient was beginning to experience lightheadedness and weakness.   Son denies any knowledge of fevers nausea vomiting dysuria cough shortness of breath.  Patient presented to the [facility] emergency department for evaluation where the patient was found to be hypotensive.  Patient underwent a basic work-up for infection which was found to be negative.  Patient was found to be COVID PCR positive but it was determined at that time that the patient was actually found to be positive at her independent living facility 2 weeks prior and it was felt that the current positive test on 6/14 was not clinically relevant.  Patient was given intravenous fluids with substantial improvement in her blood pressures.  Patient was discharged home.    Son reports that since that emergency room visit the patient is upon herself to discontinue all of her antihypertensives.    Now in the past several days the patient has been exhibiting confusion and disorientation at the facility.  Because of this increasing confusion and disorientation EMS was contacted and the patient was eventually brought into [facility] emergency department for  evaluation.  Upon evaluation in the emergency department patient was found to have a mild lactic acidosis of 2.2.  Urinalysis was found to be nitrate positive in the emergency department staff initiated intravenous ceftriaxone.  Patient was found to be markedly hypertensive throughout the emergency department stay and was administered a dose of intravenous labetalol.  The hospitalist group was then called to assess the patient for admission to the hospital.  Review of Systems:   Review of Systems  Unable to perform ROS: Mental status change   Past Medical History:  Diagnosis Date   Anxiety    Arthritis    Chronic insomnia 03/30/2015   Chronic low back pain 12/02/2016   Depression    GERD (gastroesophageal reflux disease)    Hiatal hernia    High cholesterol    Hypertelorism    Hypertension    Meningioma (HCC)    Migraine     Past Surgical History:  Procedure Laterality Date   FEMUR IM NAIL Left 06/02/2020   Procedure: INTRAMEDULLARY (IM) RETROGRADE FEMORAL NAILING - LEFT;  Surgeon: Rod Can, MD;  Location: [facility];  Service: Orthopedics;  Laterality: Left;   STOMACH SURGERY       reports that she has never smoked. She has never used smokeless tobacco. She reports that she does not drink alcohol and does not use drugs.  Allergies  Allergen Reactions   Dihydrotachysterol Other (See Comments)    Dihydrotachysterol is a form of vitamin D. Might be "DHT Intensol" (Name Brand)- Reaction unknown- tolerating 50,000 units of Vitamin D-2 (Drisdol) in 2022   Trazodone And Nefazodone Other (See Comments)    Worsens headaches  Valium [Diazepam] Other (See Comments)    headaches   Citalopram Other (See Comments)    Other reaction(s): Headache   Other Other (See Comments)    "States all antidepressants cause migraines." - tolerates Xanax     Family History  Problem Relation Age of Onset   Migraines Father    Stroke Father    Heart Problems Mother    Epilepsy Brother       Prior to Admission medications   Medication Sig Start Date End Date Taking? Authorizing Provider  ALPRAZolam (XANAX) 1 MG tablet TAKE 1/2 TABLET BY MOUTH AT NOON AND 1 TABLET AT BEDTIME Patient taking differently: Take 1.5 mg by mouth daily as needed for anxiety. 03/01/21  Yes Billie Ruddy, MD  antiseptic oral rinse (BIOTENE) LIQD Use 3 mls 3-4 times per day for dry mouth. Do not swallow. 12/26/20  Yes Martinique, [name], MD  Cyanocobalamin (VITAMIN B-12 PO) Take 1 tablet by mouth daily with lunch.   Yes [provider]  Dextran 70-Hypromellose (ARTIFICIAL TEARS PF OP) Place 1-2 drops into both eyes See admin instructions. Instill 1-2 drops into both eyes at bedtime and an additional 2-3 times a day as needed for dryness   Yes [provider]  diphenhydramine-acetaminophen (TYLENOL PM) 25-500 MG TABS tablet Take 1.5 tablets by mouth at bedtime.   Yes [provider]  gabapentin (NEURONTIN) 300 MG capsule Take 1 capsule (300 mg total) by mouth 2 (two) times daily. 12/12/20  Yes Martinique, [name], MD  Magnesium 400 MG TABS Take 400 mg by mouth at bedtime.   Yes [provider]  MAXALT-MLT 10 MG disintegrating tablet DISSOLVE ONE TABLET BY MOUTH AS NEEDED HEADACHE Patient taking differently: Take 10 mg by mouth daily as needed for migraine (or headaches- dissolve orally). 05/31/19  Yes Martinique, [name], MD  Melatonin 10 MG TABS Take 10 mg by mouth at bedtime as needed (for sleep).   Yes [provider]  omeprazole (PRILOSEC OTC) 20 MG tablet Take 20 mg by mouth daily before breakfast.   Yes [provider]  oxybutynin (DITROPAN-XL) 10 MG 24 hr tablet TAKE 1 TABLET(10 MG) BY MOUTH AT BEDTIME Patient taking differently: Take 10 mg by mouth at bedtime. 02/26/21  Yes Martinique, [name], MD  riboflavin (VITAMIN B-2) 100 MG TABS tablet Take 400 mg by mouth daily with lunch.   Yes [provider]  senna (SENOKOT) 8.6 MG TABS tablet Take 1 tablet (8.6  mg total) by mouth 2 (two) times daily. Patient taking differently: Take 1 tablet by mouth daily. 06/06/20  Yes Amin, Ankit Chirag, MD  tiZANidine (ZANAFLEX) 4 MG tablet TAKE 1 TABLET(4 MG) BY MOUTH TWICE DAILY AS NEEDED Patient taking differently: Take 4 mg by mouth 2 (two) times daily as needed for muscle spasms. 04/08/21  Yes Martinique, [name], MD  tretinoin (RETIN-A) 0.1 % cream APPLY TO AFFECTED AREA EVERY DAY AT BEDTIME Patient taking differently: Apply 1 application topically at bedtime as needed (brown spots). 09/22/18  Yes Martinique, [name], MD  verapamil (VERELAN PM) 180 MG 24 hr capsule TAKE 1 CAPSULE(180 MG) BY MOUTH DAILY Patient taking differently: Take 180 mg by mouth at bedtime. 02/06/21  Yes Martinique, [name], MD  Vitamin D, Ergocalciferol, (DRISDOL) 1.25 MG (50000 UNIT) CAPS capsule TAKE 1 CAPSULE BY MOUTH EVERY 2 WEEKS Patient taking differently: Take 50,000 Units by mouth See admin instructions. Take 50,000 units by mouth every other Sunday 10/05/20  Yes Martinique,  Malka So, MD  acetaminophen (TYLENOL) 650 MG CR tablet Take 975 mg by mouth daily.    [provider]  docusate sodium (COLACE) 100 MG capsule Take 1 capsule (100 mg total) by mouth 2 (two) times daily. 06/06/20   Amin, Jeanella Flattery, MD  POTASSIUM PO Take 1 tablet by mouth daily with lunch.    [provider]  pyridOXINE (VITAMIN B-6) 100 MG tablet Take 400 mg by mouth daily with lunch.    [provider]    Physical Exam: Vitals:   04/09/21 0400 04/09/21 0415 04/09/21 0700 04/09/21 0730  BP: (!) 184/86 (!) 154/72 (!) 191/103 (!) 193/100  Pulse: 97 84 85 87  Resp: 14 20 (!) 28 19  Temp:      TempSrc:      SpO2: 95% 91% 95% 97%  Weight:      Height:        Constitutional: Lethargic but arousable and oriented x1, no associated distress.   Skin: no rashes, no lesions, extremely poor skin turgor noted. Eyes: Pupils are equally reactive to light.  No evidence of scleral icterus or conjunctival  pallor.  ENMT: Dry mucous membranes noted.  Posterior pharynx clear of any exudate or lesions.   Neck: normal, supple, no masses, no thyromegaly.  No evidence of jugular venous distension.   Respiratory: clear to auscultation bilaterally, no wheezing, no crackles. Normal respiratory effort. No accessory muscle use.  Cardiovascular: Regular rate and rhythm, no murmurs / rubs / gallops. No extremity edema. 2+ pedal pulses. No carotid bruits.  Chest:   Nontender without crepitus or deformity.   Back:   Nontender without crepitus or deformity. Abdomen: Notable generalized abdominal tenderness.  Abdomen is soft however..  No evidence of intra-abdominal masses.  Positive bowel sounds noted in all quadrants.   Musculoskeletal: No joint deformity upper and lower extremities. Good ROM, no contractures. Normal muscle tone.  Neurologic: Lethargic but arousable and oriented x1, CN 2-12 grossly intact. Sensation intact.  Patient moving all 4 extremities spontaneously.  Patient is following all commands.  Patient is responsive to verbal stimuli.   Psychiatric: Unable to fully assess due to degree of confusion.  Patient currently does not seem to possess insight as to her current situation.  Labs on Admission: I have personally reviewed following labs and imaging studies -   CBC: Recent Labs  Lab 04/02/21 1831 04/08/21 1928 04/09/21 0447  WBC 5.2 10.1 8.4  NEUTROABS 2.4 8.3* 6.1  HGB 10.3* 12.6 11.8*  HCT 32.0* 38.4 36.1  MCV 100.6* 95.5 96.8  PLT 212 313 341   Basic Metabolic Panel: Recent Labs  Lab 04/02/21 1831 04/08/21 1928 04/09/21 0447  NA 138 138 136  K 4.2 4.2 4.0  CL 108 102 104  CO2 22 22 23   GLUCOSE 106* 150* 143*  BUN 13 13 12   CREATININE 1.05* 0.94 0.86  CALCIUM 8.4* 9.4 8.9  MG  --   --  1.8   GFR: Estimated Creatinine Clearance: 42.2 mL/min (by C-G formula based on SCr of 0.86 mg/dL). Liver Function Tests: Recent Labs  Lab 04/02/21 1831 04/08/21 1928 04/09/21 0447   AST 20 25 21   ALT 16 16 14   ALKPHOS 36* 42 41  BILITOT 0.5 0.8 0.5  PROT 5.3* 6.5 6.1*  ALBUMIN 2.9* 3.6 3.2*   Recent Labs  Lab 04/02/21 1831  LIPASE 23   No results for input(s): AMMONIA in the last 168 hours. Coagulation Profile: Recent Labs  Lab 04/08/21 1928  INR 1.1   Cardiac Enzymes: No results for input(s): CKTOTAL, CKMB, CKMBINDEX, TROPONINI in the last 168 hours. BNP (last 3 results) No results for input(s): PROBNP in the last 8760 hours. HbA1C: No results for input(s): HGBA1C in the last 72 hours. CBG: No results for input(s): GLUCAP in the last 168 hours. Lipid Profile: No results for input(s): CHOL, HDL, LDLCALC, TRIG, CHOLHDL, LDLDIRECT in the last 72 hours. Thyroid Function Tests: Recent Labs    04/09/21 0447  TSH 1.163   Anemia Panel: Recent Labs    04/09/21 0447  VITAMINB12 1,454*  FOLATE 5.2*   Urine analysis:    Component Value Date/Time   COLORURINE YELLOW 04/09/2021 0228   APPEARANCEUR CLOUDY (A) 04/09/2021 0228   LABSPEC 1.015 04/09/2021 0228   PHURINE 6.0 04/09/2021 0228   GLUCOSEU NEGATIVE 04/09/2021 0228   HGBUR NEGATIVE 04/09/2021 0228   BILIRUBINUR NEGATIVE 04/09/2021 0228   BILIRUBINUR neg 06/10/2017 1728   KETONESUR 20 (A) 04/09/2021 0228   PROTEINUR NEGATIVE 04/09/2021 0228   UROBILINOGEN 1.0 06/10/2017 1728   NITRITE POSITIVE (A) 04/09/2021 0228   LEUKOCYTESUR NEGATIVE 04/09/2021 0228    Radiological Exams on Admission - Personally Reviewed: CT Head Wo Contrast  Result Date: 04/08/2021 CLINICAL DATA:  Mental status change EXAM: CT HEAD WITHOUT CONTRAST TECHNIQUE: Contiguous axial images were obtained from the base of the skull through the vertex without intravenous contrast. COMPARISON:  CT brain 06/02/2020 FINDINGS: Brain: No acute territorial infarction, hemorrhage or new intracranial mass. Right parietal convexity calcified meningioma. Mild atrophy. Mild to moderate chronic small vessel ischemic change of the white  matter. Stable ventricle size. Vascular: No hyperdense vessels.  Carotid vascular calcification Skull: Normal. Negative for fracture or focal lesion. Sinuses/Orbits: Patchy mucosal disease in the ethmoid sinus Other: None IMPRESSION: 1. No CT evidence for acute intracranial abnormality. 2. Atrophy and chronic small vessel ischemic change of the white matter Electronically Signed   By: Donavan Foil M.D.   On: 04/08/2021 19:05   MR BRAIN WO CONTRAST  Result Date: 04/09/2021 CLINICAL DATA:  Initial evaluation for acute mental status change. EXAM: MRI HEAD WITHOUT CONTRAST TECHNIQUE: Multiplanar, multiecho pulse sequences of the brain and surrounding structures were obtained without intravenous contrast. COMPARISON:  Prior head CT from 04/08/2021. FINDINGS: Brain: Cerebral volume within normal limits for age. Patchy T2/FLAIR hyperintensity within the periventricular and deep white matter both cerebral hemispheres most consistent with chronic small vessel ischemic disease, mild for age. No abnormal foci of restricted diffusion to suggest acute or subacute ischemia. Gray-white matter differentiation maintained. No encephalomalacia to suggest chronic cortical infarction. No evidence for acute or chronic intracranial hemorrhage. 1 cm calcified meningioma overlying the right parietal convexity again noted, relatively stable from previous. No associated mass effect. No other mass lesion, midline shift or mass effect. No hydrocephalus or extra-axial fluid collection. Pituitary gland suprasellar region within normal limits. Midline structures intact. Vascular: Major intracranial vascular flow voids are maintained. Skull and upper cervical spine: Craniocervical junction within normal limits. Bone marrow signal intensity normal. No scalp soft tissue abnormality. Sinuses/Orbits: Patient status post bilateral ocular lens replacement. Globes and orbital soft tissues demonstrate no acute finding. Scattered mucosal thickening  noted within the ethmoidal air cells. Paranasal sinuses are otherwise largely clear. No mastoid effusion. Inner ear structures grossly normal. Other: None. IMPRESSION: 1. No acute intracranial abnormality. 2. Mild age-related cerebral atrophy with chronic microvascular ischemic disease. 3. 1 cm calcified meningioma overlying the right parietal convexity without associated mass effect, stable. Electronically Signed  By: Jeannine Boga M.D.   On: 04/09/2021 01:38   DG Chest Port 1 View  Result Date: 04/08/2021 CLINICAL DATA:  Pt arrives via [facility] from [place] c/o AMS and possible UTI. Pt has no hx of memory issues EXAM: PORTABLE CHEST 1 VIEW COMPARISON:  04/02/2021. FINDINGS: Cardiac silhouette partly obscured but grossly normal in size. No mediastinal or hilar masses. Bilateral prominent interstitial markings. Additional opacity at the left lung base is noted consistent with atelectasis with a possible small effusion. Lungs otherwise clear. No convincing right pleural effusion and no pneumothorax. Skeletal structures are grossly intact. IMPRESSION: No acute cardiopulmonary disease. Electronically Signed   By: Lajean Manes M.D.   On: 04/08/2021 18:19    EKG: Personally reviewed.  Rhythm is sinus tachycardia with heart rate of 120 bpm.  No dynamic ST segment changes appreciated.  Assessment/Plan Principal Problem:   Acute metabolic encephalopathy  Patient presenting with rather sudden onset of confusion and disorientation of unclear etiology Confusion seems to have developed approximately 1 week after patient suddenly stopped all of her antihypertensives so hypertensive encephalopathy is a possibility To address the possibility of hypertensive encephalopathy we will slowly restart oral antihypertensives starting with low-dose verapamil. Additionally, urinalysis is nitrite positive although the remainder of the sample is not quite compelling for urinary tract infection.   Considering patient's abdominal tenderness on examination and confusion we will continue intravenous ceftriaxone started by the emergency department staff for now and obtain CT imaging of the abdomen. Patient found to be COVID-positive on 6/14 however I have run a CT level on the sample she is 38.6 in the infection is resolved or near resolved and likely not clinically relevant. Obtaining TSH, folate, vitamin B12 and inflammatory markers Noncontrast CT imaging of the head as well as an MRI of the brain performed by the emergency department staff are unremarkable. Continue" we will monitoring, monitor for symptomatic improvement as blood pressures are corrected and patient is gently hydrated.  Active Problems:   Hypertensive urgency  As mentioned above, patient is markedly hypertensive throughout emergency department stay after discontinuing her antihypertensives 1 week ago Place patient on low-dose verapamil As needed intravenous hydralazine for markedly elevated blood pressure. Titrate antihypertensive therapy slowly over the next several days.    Lactic acidosis  Mild lactic acidosis likely secondary to some degree of volume depletion Hydrating patient gently Serial lactic acid levels to ensure downtrending and resolution.    Abnormal urinalysis  Nitrate positive urinalysis -possible urinary tract infection in the setting of abdominal tenderness See remainder of assessment and plan above.    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HCC)  Meningioma noted on MRI imaging, outpatient follow-up.    GERD without esophagitis  Continuing home regimen of daily PPI therapy.    Lab test positive for detection of COVID-19 virus  As mentioned above, positive COVID test on 6/14 head CT level 30.6 suggestive of old infections that is likely not contagious and contributing to patient's current presentation.   Code Status:  DNR Family Communication: Patient care discussed with son via phone  conversation.  Status is: Inpatient  Remains inpatient appropriate because:Ongoing diagnostic testing needed not appropriate for outpatient work up, IV treatments appropriate due to intensity of illness or inability to take PO, and Inpatient level of care appropriate due to severity of illness  Dispo: The patient is from: ALF              Anticipated d/c is to: ALF  Patient currently is not medically stable to d/c.   Difficult to place patient No        Vernelle Emerald MD [facility] Pager [phone]  If 7PM-7AM, please contact night-coverage [email or web address] Use universal [place] password for that web site. If you do not have the password, please call the hospital operator.  04/09/2021, 7:58 AM

## 2021-04-09 NOTE — ED Notes (Signed)
RN attempted report x1.  

## 2021-04-09 NOTE — ED Notes (Signed)
On assessment, patient is A/O and reports back pain 3/10. Patient states this is one of the symptoms she came to ED to have evaluated. VS monitored via cardiac monitor. Patient declined lunch; was offered PO fluids, but says she has been drinking water.Patient has sitter at bedside.

## 2021-04-09 NOTE — Plan of Care (Signed)
  Problem: Safety: Goal: Ability to remain free from injury will improve Outcome: Progressing   

## 2021-04-10 LAB — CBC WITH DIFFERENTIAL/PLATELET
Abs Immature Granulocytes: 0.03 10*3/uL (ref 0.00–0.07)
Basophils Absolute: 0 10*3/uL (ref 0.0–0.1)
Basophils Relative: 0 %
Eosinophils Absolute: 0 10*3/uL (ref 0.0–0.5)
Eosinophils Relative: 0 %
HCT: 35 % — ABNORMAL LOW (ref 36.0–46.0)
Hemoglobin: 11.8 g/dL — ABNORMAL LOW (ref 12.0–15.0)
Immature Granulocytes: 0 %
Lymphocytes Relative: 16 %
Lymphs Abs: 1.6 10*3/uL (ref 0.7–4.0)
MCH: 32.2 pg (ref 26.0–34.0)
MCHC: 33.7 g/dL (ref 30.0–36.0)
MCV: 95.4 fL (ref 80.0–100.0)
Monocytes Absolute: 0.6 10*3/uL (ref 0.1–1.0)
Monocytes Relative: 7 %
Neutro Abs: 7.5 10*3/uL (ref 1.7–7.7)
Neutrophils Relative %: 77 %
Platelets: 294 10*3/uL (ref 150–400)
RBC: 3.67 MIL/uL — ABNORMAL LOW (ref 3.87–5.11)
RDW: 13.6 % (ref 11.5–15.5)
WBC: 9.7 10*3/uL (ref 4.0–10.5)
nRBC: 0 % (ref 0.0–0.2)

## 2021-04-10 LAB — HEMOGLOBIN A1C
Hgb A1c MFr Bld: 5.6 % (ref 4.8–5.6)
Mean Plasma Glucose: 114 mg/dL

## 2021-04-10 LAB — BASIC METABOLIC PANEL
Anion gap: 11 (ref 5–15)
BUN: 13 mg/dL (ref 8–23)
CO2: 19 mmol/L — ABNORMAL LOW (ref 22–32)
Calcium: 8.9 mg/dL (ref 8.9–10.3)
Chloride: 103 mmol/L (ref 98–111)
Creatinine, Ser: 0.75 mg/dL (ref 0.44–1.00)
GFR, Estimated: >60 mL/min (ref >60)
Glucose, Bld: 129 mg/dL — ABNORMAL HIGH (ref 70–99)
Potassium: 3.4 mmol/L — ABNORMAL LOW (ref 3.5–5.1)
Sodium: 133 mmol/L — ABNORMAL LOW (ref 135–145)

## 2021-04-10 LAB — MAGNESIUM: Magnesium: 1.8 mg/dL (ref 1.7–2.4)

## 2021-04-10 MED ORDER — BISACODYL 10 MG RE SUPP: 10.0000 mg | Freq: Every day | RECTAL | Status: DC | PRN

## 2021-04-10 MED ORDER — POTASSIUM CHLORIDE CRYS ER 20 MEQ PO TBCR
40.0000 meq | EXTENDED_RELEASE_TABLET | Freq: Once | ORAL | Status: AC
  Administered 2021-04-10: 40 meq via ORAL
  Filled 2021-04-10: qty 2

## 2021-04-10 NOTE — Plan of Care (Signed)
  Problem: Clinical Measurements: Goal: Respiratory complications will improve Outcome: Progressing   Problem: Coping: Goal: Level of anxiety will decrease Outcome: Progressing   Problem: Safety: Goal: Ability to remain free from injury will improve Outcome: Progressing   

## 2021-04-10 NOTE — Progress Notes (Signed)
PROGRESS NOTE                                                                                                                                                                                                             Patient Demographics:    Tracey Armstrong, is a 86 y.o. female, DOB - 1934-12-15, JKD:[id number]  Outpatient Primary MD for the patient is [place], [name], MD   Admit date - 04/08/2021   LOS - 1  No chief complaint on file.      Brief Narrative: Patient is a 86 y.o. female with PMHx of depression, HTN, chronic pain, anemia-who presented from her independent living facility for confusion  COVID-19 vaccinated status: Vaccinated including booster  Significant Events: 6/20>> Admit to [facility] for evaluation of confusion  Significant studies: 6/20>>Chest x-ray: No pneumonia 6/20>> CT head: No acute intracranial abnormality. 6/21>> MRI brain: 1 cm calcified meningioma over the right parietal convexity without any mass-effect. 6/21>> CT abdomen/pelvis: Mild thickening of the urinary bladder, moderate volume stool  COVID-19 medications: None  Antibiotics: Rocephin: 6/20>>  Microbiology data: 6/21>> urine culture: Gram-negative rods   Procedures: None  Consults: None  DVT prophylaxis: enoxaparin (LOVENOX) injection 40 mg Start: 04/09/21 1000    Subjective:    Tracey Armstrong today seems to have improved-she is able to answer most of my questions appropriately-but at times needs reorientation.   Assessment  & Plan :   Acute metabolic encephalopathy: Etiology unclear (multiple culprits)--recently diagnosed with COVID-elevated BP on admission-on as needed benzodiazepines/Zanaflex-possible UTI-CT head without any structural abnormalities-encephalopathy seems to be improving-needs reorientation at times.  TSH/vitamin B12 levels stable.  Continue supportive care-hold Xanax/Zanaflex/Neurontin for now.  Follow  clinical course-if no improvement-we can contemplate further work-up.  Hypertensive urgency: BP improving-on verapamil-stopping IV fluids-reassess tomorrow.  Hypokalemia: Replete and recheck.  UTI: On Rocephin-await final cultures.  COVID-19 infection: Diagnosed on 6/14-Per H&P-cycle threshold of 38.6-no URI symptoms-no pneumonia on imaging-not hypoxic.  Apart from observation-doubt she requires any therapy at this point.  Continue isolation for total of 10 days from day of diagnosis.  Meningioma: Incidentally seen on MRI brain-stable for outpatient monitoring/follow-up  GERD: Continue PPI  Chronic back pain: Holding Neurontin/Zanaflex for now-we will manage with Tylenol.  Anxiety/insomnia: Hold Xanax for now-see above  Constipation: Continue MiraLAX/senna-add as needed Dulcolax if no BM over the next  few days.  ABG: No results found for: PHART, PCO2ART, PO2ART, HCO3, TCO2, ACIDBASEDEF, O2SAT  Vent Settings: N/A  Condition - Stable  Family Communication  :  Son-Quint [phone] updated over the phone 6/22  Code Status :  DNR  Diet :  Diet Order             Diet Heart Room service appropriate? Yes; Fluid consistency: Thin  Diet effective now                    Disposition Plan  :   Status is: Inpatient  Remains inpatient appropriate because:Inpatient level of care appropriate due to severity of illness  Dispo: The patient is from:  ILF              Anticipated d/c is to:  ILF              Patient currently is not medically stable to d/c.   Difficult to place patient No    Barriers to discharge: Resolving encephalopathy-not yet at baseline.  Antimicorbials  :    Anti-infectives (From admission, onward)    Start     Dose/Rate Route Frequency Ordered Stop   04/10/21 0800  cefTRIAXone (ROCEPHIN) 2 g in sodium chloride 0.9 % 100 mL IVPB        2 g 200 mL/hr over 30 Minutes Intravenous Every 24 hours 04/09/21 0744 04/12/21 0759   04/09/21 0300   cefTRIAXone (ROCEPHIN) 2 g in sodium chloride 0.9 % 100 mL IVPB        2 g 200 mL/hr over 30 Minutes Intravenous  Once 04/09/21 0257 04/09/21 0447       Inpatient Medications  Scheduled Meds:  docusate sodium  100 mg Oral BID   enoxaparin (LOVENOX) injection  40 mg Subcutaneous Daily   oxybutynin  10 mg Oral QHS   pantoprazole  40 mg Oral QAC breakfast   polyethylene glycol  17 g Oral Daily   senna-docusate  1 tablet Oral BID   verapamil  120 mg Oral Daily   Continuous Infusions:  sodium chloride 75 mL/hr at 04/09/21 1102   cefTRIAXone (ROCEPHIN)  IV 2 g (04/10/21 0909)   PRN Meds:.acetaminophen, hydrALAZINE   Time Spent in minutes  35   See all Orders from today for further details   Oren Binet M.D on 04/10/2021 at 10:37 AM  To page go to [email or web address] - use universal password  [facility] -  Office  [phone]    Objective:   Vitals:   04/10/21 0104 04/10/21 0456 04/10/21 0502 04/10/21 0806  BP: (!) 176/80  (!) 176/80 (!) 155/68  Pulse: (!) 113  (!) 113 (!) 120  Resp: 18  18   Temp: 99.6 F (37.6 C)  99.6 F (37.6 C) 98.9 F (37.2 C)  TempSrc:   Oral Oral  SpO2:   96% 97%  Weight:  58.2 kg    Height:        Wt Readings from Last 3 Encounters:  04/10/21 58.2 kg  04/02/21 62.1 kg  12/12/20 62.1 kg     Intake/Output Summary (Last 24 hours) at 04/10/2021 1037 Last data filed at 04/10/2021 0456 Gross per 24 hour  Intake 1315 ml  Output 200 ml  Net 1115 ml     Physical Exam Gen Exam:Alert awake-not in any distress HEENT:atraumatic, normocephalic Chest: B/L clear to auscultation anteriorly CVS:S1S2 regular Abdomen:soft non tender, non distended Extremities:no edema Neurology: Non focal Skin: no rash  Data Review:    CBC Recent Labs  Lab 04/08/21 1928 04/09/21 0447 04/10/21 0307  WBC 10.1 8.4 9.7  HGB 12.6 11.8* 11.8*  HCT 38.4 36.1 35.0*  PLT 313 270 294  MCV 95.5 96.8 95.4  MCH 31.3 31.6 32.2  MCHC 32.8 32.7 33.7   RDW 13.5 13.5 13.6  LYMPHSABS 1.2 1.7 1.6  MONOABS 0.6 0.6 0.6  EOSABS 0.0 0.0 0.0  BASOSABS 0.0 0.0 0.0    Chemistries  Recent Labs  Lab 04/08/21 1928 04/09/21 0447 04/10/21 0307  NA 138 136 133*  K 4.2 4.0 3.4*  CL 102 104 103  CO2 22 23 19*  GLUCOSE 150* 143* 129*  BUN 13 12 13   CREATININE 0.94 0.86 0.75  CALCIUM 9.4 8.9 8.9  MG  --  1.8 1.8  AST 25 21  --   ALT 16 14  --   ALKPHOS 42 41  --   BILITOT 0.8 0.5  --    ------------------------------------------------------------------------------------------------------------------ No results for input(s): CHOL, HDL, LDLCALC, TRIG, CHOLHDL, LDLDIRECT in the last 72 hours.  Lab Results  Component Value Date   HGBA1C 5.6 04/09/2021   ------------------------------------------------------------------------------------------------------------------ Recent Labs    04/09/21 0447  TSH 1.163   ------------------------------------------------------------------------------------------------------------------ Recent Labs    04/09/21 0447  VITAMINB12 1,454*  FOLATE 5.2*    Coagulation profile Recent Labs  Lab 04/08/21 1928  INR 1.1    No results for input(s): DDIMER in the last 72 hours.  Cardiac Enzymes No results for input(s): CKMB, TROPONINI, MYOGLOBIN in the last 168 hours.  Invalid input(s): CK ------------------------------------------------------------------------------------------------------------------ No results found for: BNP  Micro Results Recent Results (from the past 240 hour(s))  Resp Panel by RT-PCR (Flu A&B, Covid) Nasopharyngeal Swab     Status: Abnormal   Collection Time: 04/02/21  3:20 PM   Specimen: Nasopharyngeal Swab; Nasopharyngeal(NP) swabs in vial transport medium  Result Value Ref Range Status   SARS Coronavirus 2 by RT PCR POSITIVE (A) NEGATIVE Final    Comment: RESULT CALLED TO, READ BACK BY AND VERIFIED WITHHarvel Quale RN 1720 04/02/21 A BROWNING (NOTE) SARS-CoV-2 target  nucleic acids are DETECTED.  The SARS-CoV-2 RNA is generally detectable in upper respiratory specimens during the acute phase of infection. Positive results are indicative of the presence of the identified virus, but do not rule out bacterial infection or co-infection with other pathogens not detected by the test. Clinical correlation with patient history and other diagnostic information is necessary to determine patient infection status. The expected result is Negative.  Fact Sheet for Patients: EntrepreneurPulse.com.au  Fact Sheet for Healthcare Providers: IncredibleEmployment.be  This test is not yet approved or cleared by the [place] FDA and  has been authorized for detection and/or diagnosis of SARS-CoV-2 by FDA under an Emergency Use Authorization (EUA).  This EUA will remain in effect (meaning this test can  be used) for the duration of  the COVID-19 declaration under Section 564(b)(1) of the Act, 21 U.S.C. section 360bbb-3(b)(1), unless the authorization is terminated or revoked sooner.     Influenza A by PCR NEGATIVE NEGATIVE Final   Influenza B by PCR NEGATIVE NEGATIVE Final    Comment: (NOTE) The Xpert Xpress SARS-CoV-2/FLU/RSV plus assay is intended as an aid in the diagnosis of influenza from Nasopharyngeal swab specimens and should not be used as a sole basis for treatment. Nasal washings and aspirates are unacceptable for Xpert Xpress SARS-CoV-2/FLU/RSV testing.  Fact Sheet for Patients: EntrepreneurPulse.com.au  Fact Sheet for Healthcare Providers:  IncredibleEmployment.be  This test is not yet approved or cleared by the [place] and has been authorized for detection and/or diagnosis of SARS-CoV-2 by FDA under an Emergency Use Authorization (EUA). This EUA will remain in effect (meaning this test can be used) for the duration of the COVID-19 declaration under Section  564(b)(1) of the Act, 21 U.S.C. section 360bbb-3(b)(1), unless the authorization is terminated or revoked.  Performed at [facility], [place] [address], [place], [place] [address]   Urine culture     Status: Abnormal (Preliminary result)   Collection Time: 04/09/21  2:00 AM   Specimen: In/Out Cath Urine  Result Value Ref Range Status   Specimen Description IN/OUT CATH URINE  Final   Special Requests NONE  Final   Culture (A)  Final    >=100,000 COLONIES/mL GRAM NEGATIVE RODS CULTURE REINCUBATED FOR BETTER GROWTH Performed at [facility], [place] [address] [place], [place] [address]    Report Status PENDING  Incomplete    Radiology Reports CT Head Wo Contrast  Result Date: 04/08/2021 CLINICAL DATA:  Mental status change EXAM: CT HEAD WITHOUT CONTRAST TECHNIQUE: Contiguous axial images were obtained from the base of the skull through the vertex without intravenous contrast. COMPARISON:  CT brain 06/02/2020 FINDINGS: Brain: No acute territorial infarction, hemorrhage or new intracranial mass. Right parietal convexity calcified meningioma. Mild atrophy. Mild to moderate chronic small vessel ischemic change of the white matter. Stable ventricle size. Vascular: No hyperdense vessels.  Carotid vascular calcification Skull: Normal. Negative for fracture or focal lesion. Sinuses/Orbits: Patchy mucosal disease in the ethmoid sinus Other: None IMPRESSION: 1. No CT evidence for acute intracranial abnormality. 2. Atrophy and chronic small vessel ischemic change of the white matter Electronically Signed   By: Donavan Foil M.D.   On: 04/08/2021 19:05   MR BRAIN WO CONTRAST  Result Date: 04/09/2021 CLINICAL DATA:  Initial evaluation for acute mental status change. EXAM: MRI HEAD WITHOUT CONTRAST TECHNIQUE: Multiplanar, multiecho pulse sequences of the brain and surrounding structures were obtained without intravenous contrast. COMPARISON:  Prior head CT from 04/08/2021. FINDINGS: Brain:  Cerebral volume within normal limits for age. Patchy T2/FLAIR hyperintensity within the periventricular and deep white matter both cerebral hemispheres most consistent with chronic small vessel ischemic disease, mild for age. No abnormal foci of restricted diffusion to suggest acute or subacute ischemia. Gray-white matter differentiation maintained. No encephalomalacia to suggest chronic cortical infarction. No evidence for acute or chronic intracranial hemorrhage. 1 cm calcified meningioma overlying the right parietal convexity again noted, relatively stable from previous. No associated mass effect. No other mass lesion, midline shift or mass effect. No hydrocephalus or extra-axial fluid collection. Pituitary gland suprasellar region within normal limits. Midline structures intact. Vascular: Major intracranial vascular flow voids are maintained. Skull and upper cervical spine: Craniocervical junction within normal limits. Bone marrow signal intensity normal. No scalp soft tissue abnormality. Sinuses/Orbits: Patient status post bilateral ocular lens replacement. Globes and orbital soft tissues demonstrate no acute finding. Scattered mucosal thickening noted within the ethmoidal air cells. Paranasal sinuses are otherwise largely clear. No mastoid effusion. Inner ear structures grossly normal. Other: None. IMPRESSION: 1. No acute intracranial abnormality. 2. Mild age-related cerebral atrophy with chronic microvascular ischemic disease. 3. 1 cm calcified meningioma overlying the right parietal convexity without associated mass effect, stable. Electronically Signed   By: Jeannine Boga M.D.   On: 04/09/2021 01:38   CT ABDOMEN PELVIS W CONTRAST  Result Date: 04/09/2021 CLINICAL DATA:  Abdominal  pain, acute, nonlocalized, concern for UTI EXAM: CT ABDOMEN AND PELVIS WITH CONTRAST TECHNIQUE: Multidetector CT imaging of the abdomen and pelvis was performed using the standard protocol following bolus administration  of intravenous contrast. CONTRAST:  47mL OMNIPAQUE IOHEXOL 300 MG/ML  SOLN COMPARISON:  CT lumbar spine April 04, 2020. FINDINGS: Lower chest: Bibasilar atelectasis. Coronary artery calcifications. Normal size heart. No significant pericardial effusion/thickening. Moderate hiatal hernia. Hepatobiliary: No suspicious hepatic lesion. Subcentimeter hypodense lesion in the posterior right hepatic lobe on image 20/3 too small to accurately characterize but favored represent cysts. Gallbladder is unremarkable. No biliary ductal dilation. Pancreas: Within normal limits. Spleen: Normal size spleen. 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 Adrenals/Urinary Tract: Mild bilateral adrenal thickening, favor hyperplasia. No hydronephrosis. Although evaluation is slightly limited by respiratory motion there appear to be 2 focal areas of relative hypoenhancement of the right renal parenchyma for involving the lower pole on image 56/6 and upper pole on image 60/6. There is some right-sided urothelial hyperenhancement and adjacent stranding involving the renal pelvis. No solid enhancing renal lesions. Mild thickening of the urinary bladder Stomach/Bowel: Moderate-sized hiatal hernia otherwise the stomach is grossly unremarkable. No pathologic dilation of small bowel. The appendix and terminal ileum are grossly unremarkable. Sigmoid colonic diverticulosis without findings of acute diverticulitis. Rectum is distended with a moderate volume of stool with rectal wall thickening and adjacent inflammatory stranding Vascular/Lymphatic: Aortic atherosclerosis without aneurysmal dilation. Prominent periportal lymph nodes measuring up to 6 mm. No pathologically enlarged abdominal or pelvic lymph nodes. Reproductive: Uterus and bilateral adnexa are unremarkable. Other: No abdominopelvic ascites.  Musculoskeletal: Diffuse demineralization of bone. Increased height loss involving the L1 vertebral body compression deformity now with near complete vertebral plana. T9, L3 and L4 vertebral body hemangiomas. Diffuse demineralization of bone. Multilevel degenerative changes spine. Partially visualized left femoral intramedullary fixation rod. IMPRESSION: 1. Mild thickening of the urinary bladder wall which may represent cystitis. Additionally although evaluation is slightly limited by respiratory motion there 2 apparent areas of relative hypoenhancement of the right renal parenchyma with some urothelial hyperenhancement and adjacent inflammatory stranding of the renal pelvis, findings which may reflect pyelonephritis. 2. Rectum is distended with a moderate volume of stool and rectal wall thickening with adjacent inflammatory stranding, suggestive of stercoral colitis. 3. Increased height loss involving the chronic L1 vertebral body compression deformity now with near complete vertebral plana. 4. Moderate-sized hiatal hernia. 5. Sigmoid colonic diverticulosis without findings of acute diverticulitis. 6. Aortic atherosclerosis. Aortic Atherosclerosis (ICD10-I70.0). Electronically Signed   By: Dahlia Bailiff MD   On: 04/09/2021 09:16   DG Chest Port 1 View  Result Date: 04/08/2021 CLINICAL DATA:  Pt arrives via [facility] from [place] c/o AMS and possible UTI. Pt has no hx of memory issues EXAM: PORTABLE CHEST 1 VIEW COMPARISON:  04/02/2021. FINDINGS: Cardiac silhouette partly obscured but grossly normal in size. No mediastinal or hilar masses. Bilateral prominent interstitial markings. Additional opacity at the left lung base is noted consistent with atelectasis with a possible small effusion. Lungs otherwise clear. No convincing right pleural effusion and no pneumothorax. Skeletal structures are grossly intact. IMPRESSION: No acute cardiopulmonary disease. Electronically Signed   By: Lajean Manes M.D.   On: 04/08/2021 18:19   DG Chest Portable 1 View  Result Date: 04/02/2021 CLINICAL DATA:  Hypotension and dizziness EXAM: PORTABLE CHEST 1 VIEW COMPARISON:  June 02, 2020 FINDINGS: There is slight left base atelectasis. Lungs elsewhere are clear. Heart size and pulmonary vascularity are normal. No adenopathy. There is aortic atherosclerosis. No bone lesions. IMPRESSION: Left base atelectasis. No edema or airspace opacity. Heart size normal. Aortic Atherosclerosis (ICD10-I70.0). Electronically Signed   By: Lowella Grip III M.D.   On: 04/02/2021 15:58

## 2021-04-10 NOTE — Evaluation (Addendum)
Physical Therapy Evaluation Patient Details Name: Tracey Armstrong MRN: [id number] DOB: [birth year] Today's Date: 04/10/2021   History of Present Illness  Pt adm 6/20 with AMS possibly due to UTI. Pt also hypertensive after she stopped taking antihypertensives 1 week prior to DC. Pt also covid + recently at facility and in ED but now off precautions. PMH - Lt femur fx,  compression fx of L2, anxiety/depression, GERD, chronic pain, and HTN.  Clinical Impression  Pt presents to PT with decr mobility due to weakness and decr cognition. Pt perseverating throughout treatment on finding her red rollator and her shoes. Attempted to reorient to being in the hospital and that likely her shoes and equipment are at home. Pt with hired caregiver in the room. Appears pt has hired caregivers throughout at home. Recommend return home with hired caregivers.     Follow Up Recommendations Home health PT;Supervision for mobility/OOB    Equipment Recommendations  None recommended by PT    Recommendations for Other Services       Precautions / Restrictions Precautions Precautions: Fall      Mobility  Bed Mobility Overal bed mobility: Needs Assistance Bed Mobility: Supine to Sit     Supine to sit: Min assist     General bed mobility comments: assist to elevate trunk into sitting and bring hips to EOB. Pt very anxious about falling off of bed    Transfers Overall transfer level: Needs assistance Equipment used: 4-wheeled walker Transfers: Sit to/from [facility] Sit to Stand: Min assist Stand pivot transfers: Min assist       General transfer comment: Assist to bring hips up and for balance. Pt very anxious about falling  Ambulation/Gait Ambulation/Gait assistance: Min guard Gait Distance (Feet): 15 Feet Assistive device: 4-wheeled walker Gait Pattern/deviations: Step-through pattern;Decreased step length - right;Decreased step length - left;Trunk flexed Gait velocity:  decr Gait velocity interpretation: <1.8 ft/sec, indicate of risk for recurrent falls General Gait Details: Assist for safety and lines  Stairs            Wheelchair Mobility    Modified Rankin (Stroke Patients Only)       Balance Overall balance assessment: Needs assistance Sitting-balance support: No upper extremity supported;Feet supported Sitting balance-Leahy Scale: Fair     Standing balance support: Bilateral upper extremity supported Standing balance-Leahy Scale: Poor Standing balance comment: rollator and min guard for static standing                             Pertinent Vitals/Pain Pain Assessment: Faces Faces Pain Scale: Hurts a little bit Pain Location: generalized Pain Descriptors / Indicators: Sore Pain Intervention(s): Limited activity within patient's tolerance;Premedicated before session    Home Living Family/patient expects to be discharged to:: Private residence Living Arrangements: Alone Available Help at Discharge: Personal care attendant;Available 24 hours/day Type of Home: Independent living facility Home Access: Level entry;Elevator     Home Layout: One level Home Equipment: Walker - 4 wheels      Prior Function Level of Independence: Needs assistance   Gait / Transfers Assistance Needed: amb with rollator with assist at times           Hand Dominance   Dominant Hand: Right    Extremity/Trunk Assessment   Upper Extremity Assessment Upper Extremity Assessment: Defer to OT evaluation    Lower Extremity Assessment Lower Extremity Assessment: Generalized weakness       Communication   Communication: No difficulties  Cognition Arousal/Alertness: Awake/alert Behavior During Therapy: Anxious Overall Cognitive Status: Impaired/Different from baseline Area of Impairment: Orientation;Attention;Memory;Following commands;Safety/judgement;Awareness;Problem solving                 Orientation Level: Disoriented  to;Situation;Time;Place (answers hospital to question where are you but within seconds acting like she is at home) Current Attention Level: Sustained Memory: Decreased short-term memory;Decreased recall of precautions Following Commands: Follows one step commands consistently;Follows one step commands with increased time Safety/Judgement: Decreased awareness of safety;Decreased awareness of deficits Awareness: Intellectual Problem Solving: Requires verbal cues;Requires tactile cues;Slow processing        General Comments      Exercises     Assessment/Plan    PT Assessment Patient needs continued PT services  PT Problem List Decreased strength;Decreased activity tolerance;Decreased balance;Decreased mobility;Decreased cognition       PT Treatment Interventions DME instruction;Gait training;Functional mobility training;Therapeutic activities;Therapeutic exercise;Balance training;Patient/family education    PT Goals (Current goals can be found in the Care Plan section)  Acute Rehab PT Goals Patient Stated Goal: to find her shoes and rollator PT Goal Formulation: Patient unable to participate in goal setting Time For Goal Achievement: 04/24/21 Potential to Achieve Goals: Good    Frequency Min 3X/week   Barriers to discharge        Co-evaluation               AM-PAC PT "6 Clicks" Mobility  Outcome Measure Help needed turning from your back to your side while in a flat bed without using bedrails?: A Little Help needed moving from lying on your back to sitting on the side of a flat bed without using bedrails?: A Little Help needed moving to and from a bed to a chair (including a wheelchair)?: A Little Help needed standing up from a chair using your arms (e.g., wheelchair or bedside chair)?: A Little Help needed to walk in hospital room?: A Little Help needed climbing 3-5 steps with a railing? : A Lot 6 Click Score: 17    End of Session Equipment Utilized During  Treatment: Gait belt Activity Tolerance: Patient tolerated treatment well Patient left: in chair;with call bell/phone within reach;with chair alarm set;with family/visitor present Nurse Communication: Mobility status PT Visit Diagnosis: Other abnormalities of gait and mobility (R26.89);Muscle weakness (generalized) (M62.81)    Time: 5974-1638 PT Time Calculation (min) (ACUTE ONLY): 36 min   Charges:   PT Evaluation $PT Eval Moderate Complexity: 1 Mod PT Treatments $Gait Training: 8-22 mins        [place] Pager [phone] [place] 04/10/2021, 12:21 PM

## 2021-04-10 NOTE — Evaluation (Signed)
Occupational Therapy Evaluation Patient Details Name: Tracey Armstrong MRN: [id number] DOB: 1935/10/07 Today's Date: 04/10/2021    History of Present Illness Pt adm 6/20 with AMS possibly due to UTI. Pt also hypertensive after she stopped taking antihypertensives 1 week prior to DC. Pt also covid + at ED visit (6/14). PMH - Lt femur fx,  compression fx of L2, anxiety/depression, GERD, chronic pain, and HTN.   Clinical Impression   Pt PTA: Pt at [facility], has PCA 12 hours daily to assist with ADL/iADL and mobility. Pt currently, limited by decreased strength, decreased ability to care for herself and chronic pain.  Pt set-upA to minA for ADL and minA overall for mobility with RW in room. Pt fatigued easily. Pt very anxious of falling. Pt would benefit from continued OT skilled services. OT following acutely.    Follow Up Recommendations  Home health OT;Supervision - Intermittent    Equipment Recommendations  None recommended by OT    Recommendations for Other Services       Precautions / Restrictions Precautions Precautions: Fall Restrictions Weight Bearing Restrictions: No      Mobility Bed Mobility Overal bed mobility: Needs Assistance Bed Mobility: Sit to Supine     Supine to sit: Min assist Sit to supine: Supervision   General bed mobility comments: Pt use of rail; getting  BLEs onto bed    Transfers Overall transfer level: Needs assistance Equipment used: Rolling walker (2 wheeled) Transfers: Sit to/from [facility] Sit to Stand: Min assist Stand pivot transfers: Min assist       General transfer comment: Assist to bring hips up and for balance. Pt very anxious about falling    Balance Overall balance assessment: Needs assistance Sitting-balance support: No upper extremity supported;Feet supported Sitting balance-Leahy Scale: Fair     Standing balance support: Bilateral upper extremity supported Standing balance-Leahy Scale:  Poor Standing balance comment: RW and min guard for static standing                           ADL either performed or assessed with clinical judgement   ADL Overall ADL's : Needs assistance/impaired Eating/Feeding: Set up;Sitting   Grooming: Set up;Sitting   Upper Body Bathing: Set up;Sitting   Lower Body Bathing: Minimal assistance;Sitting/lateral leans;Sit to/from stand;Cueing for safety   Upper Body Dressing : Set up;Sitting   Lower Body Dressing: Minimal assistance;Cueing for safety;Sitting/lateral leans;Sit to/from stand   Toilet Transfer: Minimal assistance;Cueing for safety;Stand-pivot;Grab Insurance underwriter Details (indicate cue type and reason): BSC over commode Toileting- Clothing Manipulation and Hygiene: Minimal assistance;Sitting/lateral lean;Sit to/from stand;Cueing for safety Toileting - Clothing Manipulation Details (indicate cue type and reason): Pt reaching from sitting, but unsteady with standing requiring assist     Functional mobility during ADLs: Minimal assistance;Rolling walker;Cueing for safety General ADL Comments: Pt limited by minimal cognitive deficits in memory, safety and decreased ability to care for self.     Vision Baseline Vision/History: No visual deficits Patient Visual Report: No change from baseline Vision Assessment?: No apparent visual deficits     Perception     Praxis      Pertinent Vitals/Pain Pain Assessment: Faces Faces Pain Scale: Hurts even more Pain Location: back, knee, L hand Pain Descriptors / Indicators: Sore Pain Intervention(s): Monitored during session;Repositioned     Hand Dominance Right   Extremity/Trunk Assessment Upper Extremity Assessment Upper Extremity Assessment: Generalized weakness   Lower Extremity Assessment Lower Extremity Assessment: Generalized weakness  Cervical / Trunk Assessment Cervical / Trunk Assessment: Kyphotic   Communication Communication Communication: No  difficulties   Cognition Arousal/Alertness: Awake/alert Behavior During Therapy: Anxious Overall Cognitive Status: Impaired/Different from baseline Area of Impairment: Orientation;Attention;Memory;Following commands;Safety/judgement;Awareness;Problem solving                 Orientation Level: Disoriented to;Situation;Time;Place (answers hospital to question where are you but within seconds acting like she is at home) Current Attention Level: Sustained Memory: Decreased short-term memory;Decreased recall of precautions Following Commands: Follows one step commands consistently;Follows one step commands with increased time Safety/Judgement: Decreased awareness of safety;Decreased awareness of deficits Awareness: Intellectual Problem Solving: Requires verbal cues;Requires tactile cues;Slow processing     General Comments  Pt's PCA in room from 9a-3pm today.    Exercises     Shoulder Instructions      Home Living Family/patient expects to be discharged to:: Private residence Living Arrangements: Alone Available Help at Discharge: Personal care attendant;Available 24 hours/day Type of Home: Independent living facility Home Access: Level entry;Elevator     Home Layout: One level     Bathroom Shower/Tub: Teacher, early years/pre: Handicapped height     Home Equipment: Environmental consultant - 4 wheels          Prior Functioning/Environment Level of Independence: Needs assistance  Gait / Transfers Assistance Needed: amb with rollator with assist at times ADL's / [place] Needed: mostly independent with ADL, but has PCA 9a-9p 7 days weekly.            OT Problem List: Decreased strength;Decreased activity tolerance;Impaired balance (sitting and/or standing);Decreased safety awareness;Pain;Increased edema      OT Treatment/Interventions: Self-care/ADL training;Therapeutic exercise;Therapeutic activities;Cognitive remediation/compensation;Patient/family  education;Balance training;Energy conservation    OT Goals(Current goals can be found in the care plan section) Acute Rehab OT Goals Patient Stated Goal: to find her shoes and go to bed OT Goal Formulation: With patient Time For Goal Achievement: 04/24/21 Potential to Achieve Goals: Good ADL Goals Pt Will Perform Lower Body Dressing: with supervision;sit to/from stand Pt Will Transfer to Toilet: ambulating;with supervision;grab bars;bedside commode Additional ADL Goal #1: Pt will follow 1-2 step commands with good safety awareness with minimal cues in 2/3 trials.  OT Frequency: Min 2X/week   Barriers to D/C:            Co-evaluation              AM-PAC OT "6 Clicks" Daily Activity     Outcome Measure Help from another person eating meals?: None Help from another person taking care of personal grooming?: A Little Help from another person toileting, which includes using toliet, bedpan, or urinal?: A Little Help from another person bathing (including washing, rinsing, drying)?: A Little Help from another person to put on and taking off regular upper body clothing?: None Help from another person to put on and taking off regular lower body clothing?: A Little 6 Click Score: 20   End of Session Equipment Utilized During Treatment: Rolling walker Nurse Communication: Mobility status  Activity Tolerance: Patient tolerated treatment well Patient left: in bed;with call bell/phone within reach;with bed alarm set;with family/visitor present  OT Visit Diagnosis: Unsteadiness on feet (R26.81);Muscle weakness (generalized) (M62.81);Pain Pain - Right/Left: Left Pain - part of body: Knee (low back and L hand)                Time: 1310-1350 OT Time Calculation (min): 40 min Charges:  OT General Charges $OT Visit: 1 Visit OT Evaluation $  OT Eval Moderate Complexity: 1 Mod OT Treatments $Self Care/Home Management : 8-22 mins $Therapeutic Activity: 8-22 mins Jefferey Pica, OTR/L Acute  Rehabilitation Services Pager: [phone] Office: [phone]   Adonte Vanriper C 04/10/2021, 2:24 PM

## 2021-04-11 ENCOUNTER — Inpatient Hospital Stay (HOSPITAL_COMMUNITY): Payer: Medicare Other

## 2021-04-11 DIAGNOSIS — N390 Urinary tract infection, site not specified: Secondary | ICD-10-CM

## 2021-04-11 DIAGNOSIS — G40901 Epilepsy, unspecified, not intractable, with status epilepticus: Principal | ICD-10-CM

## 2021-04-11 LAB — CBC WITH DIFFERENTIAL/PLATELET
Abs Immature Granulocytes: 0.03 10*3/uL (ref 0.00–0.07)
Basophils Absolute: 0 10*3/uL (ref 0.0–0.1)
Basophils Relative: 0 %
Eosinophils Absolute: 0 10*3/uL (ref 0.0–0.5)
Eosinophils Relative: 0 %
HCT: 33.1 % — ABNORMAL LOW (ref 36.0–46.0)
Hemoglobin: 11.2 g/dL — ABNORMAL LOW (ref 12.0–15.0)
Immature Granulocytes: 0 %
Lymphocytes Relative: 27 %
Lymphs Abs: 2.1 10*3/uL (ref 0.7–4.0)
MCH: 31.8 pg (ref 26.0–34.0)
MCHC: 33.8 g/dL (ref 30.0–36.0)
MCV: 94 fL (ref 80.0–100.0)
Monocytes Absolute: 0.6 10*3/uL (ref 0.1–1.0)
Monocytes Relative: 8 %
Neutro Abs: 5 10*3/uL (ref 1.7–7.7)
Neutrophils Relative %: 65 %
Platelets: 240 10*3/uL (ref 150–400)
RBC: 3.52 MIL/uL — ABNORMAL LOW (ref 3.87–5.11)
RDW: 13.5 % (ref 11.5–15.5)
WBC: 7.8 10*3/uL (ref 4.0–10.5)
nRBC: 0 % (ref 0.0–0.2)

## 2021-04-11 LAB — BASIC METABOLIC PANEL
Anion gap: 11 (ref 5–15)
BUN: 11 mg/dL (ref 8–23)
CO2: 21 mmol/L — ABNORMAL LOW (ref 22–32)
Calcium: 8.9 mg/dL (ref 8.9–10.3)
Chloride: 102 mmol/L (ref 98–111)
Creatinine, Ser: 0.8 mg/dL (ref 0.44–1.00)
GFR, Estimated: >60 mL/min (ref >60)
Glucose, Bld: 112 mg/dL — ABNORMAL HIGH (ref 70–99)
Potassium: 3.3 mmol/L — ABNORMAL LOW (ref 3.5–5.1)
Sodium: 134 mmol/L — ABNORMAL LOW (ref 135–145)

## 2021-04-11 LAB — URINALYSIS, COMPLETE (UACMP) WITH MICROSCOPIC
Bilirubin Urine: NEGATIVE
Glucose, UA: NEGATIVE mg/dL
Hgb urine dipstick: NEGATIVE
Ketones, ur: NEGATIVE mg/dL
Leukocytes,Ua: NEGATIVE
Nitrite: NEGATIVE
Protein, ur: NEGATIVE mg/dL
Specific Gravity, Urine: >1.046 — ABNORMAL HIGH (ref 1.005–1.030)
pH: 5 (ref 5.0–8.0)

## 2021-04-11 LAB — RAPID URINE DRUG SCREEN, HOSP PERFORMED
Amphetamines: NOT DETECTED
Barbiturates: NOT DETECTED
Benzodiazepines: NOT DETECTED
Cocaine: NOT DETECTED
Opiates: NOT DETECTED
Tetrahydrocannabinol: NOT DETECTED

## 2021-04-11 LAB — MAGNESIUM: Magnesium: 1.8 mg/dL (ref 1.7–2.4)

## 2021-04-11 LAB — MRSA NEXT GEN BY PCR, NASAL: MRSA by PCR Next Gen: NOT DETECTED

## 2021-04-11 MED ORDER — SODIUM CHLORIDE 0.9 % IV SOLN: 50.0000 mL | Freq: Once | INTRAVENOUS | Status: DC

## 2021-04-11 MED ORDER — SENNOSIDES-DOCUSATE SODIUM 8.6-50 MG PO TABS: 1.0000 | ORAL_TABLET | Freq: Every evening | ORAL | Status: DC | PRN

## 2021-04-11 MED ORDER — LEVETIRACETAM IN NACL 500 MG/100ML IV SOLN
500.0000 mg | Freq: Two times a day (BID) | INTRAVENOUS | Status: DC
  Administered 2021-04-12 – 2021-04-13 (×3): 500 mg via INTRAVENOUS
  Filled 2021-04-11 (×3): qty 100

## 2021-04-11 MED ORDER — CHLORHEXIDINE GLUCONATE CLOTH 2 % EX PADS
6.0000 | MEDICATED_PAD | Freq: Every day | CUTANEOUS | Status: DC
  Administered 2021-04-12: 6 via TOPICAL

## 2021-04-11 MED ORDER — SODIUM CHLORIDE 0.9 % IV SOLN
INTRAVENOUS | Status: DC
Start: 2021-04-11 — End: 2021-04-13

## 2021-04-11 MED ORDER — LORAZEPAM 2 MG/ML IJ SOLN: 2.0000 mg | INTRAMUSCULAR | Status: AC

## 2021-04-11 MED ORDER — ALTEPLASE (STROKE) FULL DOSE INFUSION
0.9000 mg/kg | Freq: Once | INTRAVENOUS | Status: AC
  Administered 2021-04-11: 52.6 mg via INTRAVENOUS
  Filled 2021-04-11: qty 100

## 2021-04-11 MED ORDER — ACETAMINOPHEN 325 MG PO TABS
650.0000 mg | ORAL_TABLET | ORAL | Status: DC | PRN
  Administered 2021-04-12 – 2021-04-14 (×8): 650 mg via ORAL
  Filled 2021-04-11 (×9): qty 2

## 2021-04-11 MED ORDER — VERAPAMIL HCL ER 180 MG PO TBCR
180.0000 mg | EXTENDED_RELEASE_TABLET | Freq: Every day | ORAL | Status: DC
  Administered 2021-04-12 – 2021-04-14 (×3): 180 mg via ORAL
  Filled 2021-04-11 (×3): qty 1

## 2021-04-11 MED ORDER — GABAPENTIN 100 MG PO CAPS
100.0000 mg | ORAL_CAPSULE | Freq: Two times a day (BID) | ORAL | Status: DC
  Administered 2021-04-11 – 2021-04-14 (×7): 100 mg via ORAL
  Filled 2021-04-11 (×7): qty 1

## 2021-04-11 MED ORDER — SODIUM CHLORIDE 0.9 % IV SOLN
INTRAVENOUS | Status: DC | PRN
  Administered 2021-04-11: 250 mL via INTRAVENOUS

## 2021-04-11 MED ORDER — ACETAMINOPHEN 160 MG/5ML PO SOLN
650.0000 mg | ORAL | Status: DC | PRN
  Filled 2021-04-11 (×2): qty 20.3

## 2021-04-11 MED ORDER — VITAMIN B-6 100 MG PO TABS
400.0000 mg | ORAL_TABLET | Freq: Every day | ORAL | Status: DC
  Administered 2021-04-11 – 2021-04-14 (×4): 400 mg via ORAL
  Filled 2021-04-11 (×4): qty 4

## 2021-04-11 MED ORDER — STROKE: EARLY STAGES OF RECOVERY BOOK
Freq: Once | Status: DC
  Filled 2021-04-11: qty 1

## 2021-04-11 MED ORDER — ACETAMINOPHEN 650 MG RE SUPP
650.0000 mg | RECTAL | Status: DC | PRN
  Administered 2021-04-11: 650 mg via RECTAL
  Filled 2021-04-11: qty 1

## 2021-04-11 MED ORDER — LEVETIRACETAM IN NACL 1000 MG/100ML IV SOLN
1000.0000 mg | Freq: Once | INTRAVENOUS | Status: AC
  Administered 2021-04-11: 1000 mg via INTRAVENOUS
  Filled 2021-04-11: qty 100

## 2021-04-11 MED ORDER — PANTOPRAZOLE SODIUM 40 MG IV SOLR
40.0000 mg | Freq: Every day | INTRAVENOUS | Status: DC
  Administered 2021-04-11 – 2021-04-12 (×2): 40 mg via INTRAVENOUS
  Filled 2021-04-11 (×2): qty 40

## 2021-04-11 MED ORDER — LORAZEPAM 2 MG/ML IJ SOLN
INTRAMUSCULAR | Status: AC
  Administered 2021-04-11: 2 mg
  Filled 2021-04-11: qty 1

## 2021-04-11 MED ORDER — TIZANIDINE HCL 4 MG PO TABS: 4.0000 mg | ORAL_TABLET | Freq: Three times a day (TID) | ORAL | Status: DC | PRN

## 2021-04-11 MED ORDER — ALPRAZOLAM 0.25 MG PO TABS
0.2500 mg | ORAL_TABLET | Freq: Two times a day (BID) | ORAL | Status: DC | PRN
  Administered 2021-04-11 – 2021-04-12 (×2): 0.25 mg via ORAL
  Filled 2021-04-11 (×2): qty 1

## 2021-04-11 MED ORDER — POTASSIUM CHLORIDE CRYS ER 20 MEQ PO TBCR
40.0000 meq | EXTENDED_RELEASE_TABLET | ORAL | Status: AC
  Administered 2021-04-11 (×2): 40 meq via ORAL
  Filled 2021-04-11 (×2): qty 2

## 2021-04-11 MED ORDER — TIZANIDINE HCL 4 MG PO TABS
2.0000 mg | ORAL_TABLET | Freq: Three times a day (TID) | ORAL | Status: DC | PRN
  Administered 2021-04-12 – 2021-04-13 (×3): 2 mg via ORAL
  Filled 2021-04-11 (×5): qty 1

## 2021-04-11 MED ORDER — IOHEXOL 350 MG/ML SOLN
100.0000 mL | Freq: Once | INTRAVENOUS | Status: AC | PRN
  Administered 2021-04-11: 100 mL via INTRAVENOUS

## 2021-04-11 NOTE — Significant Event (Signed)
Rapid Response Event Note   Reason for Call :  Code Stroke, pt not speaking She was last known well today at 23 when the bedside RN had a conversation with her regarding her medications   Initial Focused Assessment:  Patient is lying in the bed.  She is alert but she is mute. BP 161/75  HR 109  RR 20  O2 sat 96% on RA She is hospitalized for treatment of UTI and metabolic encephalopathy. Pt with tremors,  per caregiver and bedside RN this is baseline for patient.  NIHSS 18:  Left Gaze preference, facial droop, Not answering questions or following commands, some weakness in all extremities, Mute.  Transported urgently to radiology for stat head CT Dr Leonie Man at bedside to assess patient Dr Leonie Man spoke with son via phone  Stat head CT 2nd PIV placed, right AC 18 ga CTA head and neck, and CTP done  1532  TPA started  1550  Pt transported to 4N19 for ICU care She is starting to say a few words Hand off with Lubertha South RN      Interventions:    Plan of Care:     Event Summary:   MD Notified:  Call Time: Arrival Time: End Time:  Raliegh Ip, RN

## 2021-04-11 NOTE — Progress Notes (Signed)
PT Cancellation Note  Patient Details Name: Tracey Armstrong MRN: [id number] DOB: 03-19-1935   Cancelled Treatment:    Reason Eval/Treat Not Completed: Patient at procedure or test/unavailable STAT CT  Tracey Armstrong, DPT Acute Rehabilitation Services [phone]    Tracey Armstrong 04/11/2021, 2:57 PM

## 2021-04-11 NOTE — Progress Notes (Signed)
STAT EEG completed; results pending, Dr Yadav notified. 

## 2021-04-11 NOTE — Consult Note (Signed)
Reason for Consult: Code stroke Referring Physician: Tyreona Armstrong is an 86 y.o. female.  HPI: [name] is a 86 year old Caucasian lady with past medical history of gastroesophageal flux disease, anemia, hypertension, depression, chronic pain syndrome was admitted to [facility] on 04/09/2021 with altered mental status with confusion and disorientation.  She was initially found to be hypotensive but work-up for sepsis was negative.  She was found to be COVID PCR positive but it was determined later that she had actually been positive more than 2 weeks before she was not considered to be infectious.  She was discharged home and then returned with increased confusion disorientation and was found to have mild lactic acidosis of 2.2 and UA was positive for nitrates and she was started on IV ceftriaxone.  Patient was seen by medical hospitalist lab this morning on a.m. rounds and was found to be improving and able to speak and communicate.  The patient's nurse last spoke to her today at 11:30 AM given lunch and she speak all right.  When the nurse went back to the room at 3 PM patient was unresponsive and not speaking though she was awake and alert.  She was also not following commands.  She had left gaze deviation.  There was no witnessed seizure activity noted.  No tongue bite or incontinence.  She has no known prior history of seizures.  Emergent CT scan of the head was obtained which showed no acute abnormality.  Spoke to the patient's son over the phone and obtained verbal consent for administration.  Explained risk benefits including risk of bleeding intracerebral hemorrhage expressed understanding to proceed.  tPA was administered after considering inclusion inclusion criteria.  Emergent CT angiogram was obtained which showed no evidence of LVO and CT perfusion showed no core infarct or l penumbra last seen normal 11:30 AM sick/20 12/2020 IV tPA yes at 1531 Mechanical thrombectomy  no as no LVO on CT angio Past Medical History:  Diagnosis Date   Anxiety    Arthritis    Chronic insomnia 03/30/2015   Chronic low back pain 12/02/2016   Depression    GERD (gastroesophageal reflux disease)    Hiatal hernia    High cholesterol    Hypertelorism    Hypertension    Meningioma (HCC)    Migraine     Past Surgical History:  Procedure Laterality Date   FEMUR IM NAIL Left 06/02/2020   Procedure: INTRAMEDULLARY (IM) RETROGRADE FEMORAL NAILING - LEFT;  Surgeon: Rod Can, MD;  Location: [place];  Service: Orthopedics;  Laterality: Left;   STOMACH SURGERY      Family History  Problem Relation Age of Onset   Migraines Father    Stroke Father    Heart Problems Mother    Epilepsy Brother     Social History:  reports that she has never smoked. She has never used smokeless tobacco. She reports that she does not drink alcohol and does not use drugs.  Allergies:  Allergies  Allergen Reactions   Dihydrotachysterol Other (See Comments)    Dihydrotachysterol is a form of vitamin D. Might be "DHT Intensol" (Name Brand)- Reaction unknown- tolerating 50,000 units of Vitamin D-2 (Drisdol) in 2022   Trazodone And Nefazodone Other (See Comments)    Worsens headaches   Valium [Diazepam] Other (See Comments)    headaches   Citalopram Other (See Comments)    Other reaction(s): Headache   Other Other (See Comments)    "States all antidepressants  cause migraines." - tolerates Xanax     Medications: I have reviewed the patient's current medications.  Results for orders placed or performed during the hospital encounter of 04/08/21 (from the past 48 hour(s))  Basic metabolic panel     Status: Abnormal   Collection Time: 04/10/21  3:07 AM  Result Value Ref Range   Sodium 133 (L) 135 - 145 mmol/L   Potassium 3.4 (L) 3.5 - 5.1 mmol/L   Chloride 103 98 - 111 mmol/L   CO2 19 (L) 22 - 32 mmol/L   Glucose, Bld 129 (H) 70 - 99 mg/dL    Comment: Glucose reference range applies only  to samples taken after fasting for at least 8 hours.   BUN 13 8 - 23 mg/dL   Creatinine, Ser 0.75 0.44 - 1.00 mg/dL   Calcium 8.9 8.9 - 10.3 mg/dL   GFR, Estimated >60 >60 mL/min    Comment: (NOTE) Calculated using the CKD-EPI Creatinine Equation (2021)    Anion gap 11 5 - 15    Comment: Performed at [facility] [address] [place], [place] [address]  CBC with Differential/Platelet     Status: Abnormal   Collection Time: 04/10/21  3:07 AM  Result Value Ref Range   WBC 9.7 4.0 - 10.5 K/uL   RBC 3.67 (L) 3.87 - 5.11 MIL/uL   Hemoglobin 11.8 (L) 12.0 - 15.0 g/dL   HCT 35.0 (L) 36.0 - 46.0 %   MCV 95.4 80.0 - 100.0 fL   MCH 32.2 26.0 - 34.0 pg   MCHC 33.7 30.0 - 36.0 g/dL   RDW 13.6 11.5 - 15.5 %   Platelets 294 150 - 400 K/uL   nRBC 0.0 0.0 - 0.2 %   Neutrophils Relative % 77 %   Neutro Abs 7.5 1.7 - 7.7 K/uL   Lymphocytes Relative 16 %   Lymphs Abs 1.6 0.7 - 4.0 K/uL   Monocytes Relative 7 %   Monocytes Absolute 0.6 0.1 - 1.0 K/uL   Eosinophils Relative 0 %   Eosinophils Absolute 0.0 0.0 - 0.5 K/uL   Basophils Relative 0 %   Basophils Absolute 0.0 0.0 - 0.1 K/uL   Immature Granulocytes 0 %   Abs Immature Granulocytes 0.03 0.00 - 0.07 K/uL    Comment: Performed at [facility], [address], [place], [place] [address]  Magnesium     Status: None   Collection Time: 04/10/21  3:07 AM  Result Value Ref Range   Magnesium 1.8 1.7 - 2.4 mg/dL    Comment: Performed at [facility] [address], [place], [place] [address]  Basic metabolic panel     Status: Abnormal   Collection Time: 04/11/21  3:00 AM  Result Value Ref Range   Sodium 134 (L) 135 - 145 mmol/L   Potassium 3.3 (L) 3.5 - 5.1 mmol/L   Chloride 102 98 - 111 mmol/L   CO2 21 (L) 22 - 32 mmol/L   Glucose, Bld 112 (H) 70 - 99 mg/dL    Comment: Glucose reference range applies only to samples taken after fasting for at least 8 hours.   BUN 11 8 - 23 mg/dL   Creatinine, Ser 0.80 0.44 - 1.00  mg/dL   Calcium 8.9 8.9 - 10.3 mg/dL   GFR, Estimated >60 >60 mL/min    Comment: (NOTE) Calculated using the CKD-EPI Creatinine Equation (2021)    Anion gap 11 5 - 15    Comment: Performed  at [facility], [place] [address], [place], [place] [address]  CBC with Differential/Platelet     Status: Abnormal   Collection Time: 04/11/21  3:00 AM  Result Value Ref Range   WBC 7.8 4.0 - 10.5 K/uL   RBC 3.52 (L) 3.87 - 5.11 MIL/uL   Hemoglobin 11.2 (L) 12.0 - 15.0 g/dL   HCT 33.1 (L) 36.0 - 46.0 %   MCV 94.0 80.0 - 100.0 fL   MCH 31.8 26.0 - 34.0 pg   MCHC 33.8 30.0 - 36.0 g/dL   RDW 13.5 11.5 - 15.5 %   Platelets 240 150 - 400 K/uL   nRBC 0.0 0.0 - 0.2 %   Neutrophils Relative % 65 %   Neutro Abs 5.0 1.7 - 7.7 K/uL   Lymphocytes Relative 27 %   Lymphs Abs 2.1 0.7 - 4.0 K/uL   Monocytes Relative 8 %   Monocytes Absolute 0.6 0.1 - 1.0 K/uL   Eosinophils Relative 0 %   Eosinophils Absolute 0.0 0.0 - 0.5 K/uL   Basophils Relative 0 %   Basophils Absolute 0.0 0.0 - 0.1 K/uL   Immature Granulocytes 0 %   Abs Immature Granulocytes 0.03 0.00 - 0.07 K/uL    Comment: Performed at [facility] [address] [place], [place] [address]  Magnesium     Status: None   Collection Time: 04/11/21  3:00 AM  Result Value Ref Range   Magnesium 1.8 1.7 - 2.4 mg/dL    Comment: Performed at [facility] [address], [place], [place] [address]    CT CEREBRAL PERFUSION W CONTRAST  Result Date: 04/11/2021 CLINICAL DATA:  Code stroke follow-up EXAM: CT ANGIOGRAPHY HEAD AND NECK CT PERFUSION BRAIN TECHNIQUE: Multidetector CT imaging of the head and neck was performed using the standard protocol during bolus administration of intravenous contrast. Multiplanar CT image reconstructions and MIPs were obtained to evaluate the vascular anatomy. Carotid stenosis measurements (when applicable) are obtained utilizing NASCET criteria, using the distal internal carotid diameter as the denominator.  Multiphase CT imaging of the brain was performed following IV bolus contrast injection. Subsequent parametric perfusion maps were calculated using RAPID software. CONTRAST:  129mL OMNIPAQUE IOHEXOL 350 MG/ML SOLN COMPARISON:  None. FINDINGS: CTA NECK Aortic arch: Calcified and noncalcified plaque along the arch and patent great vessel origins. No high-grade stenosis of the proximal subclavian arteries. Right carotid system: Patent. Noncalcified plaque along the proximal internal carotid causing less than 50% stenosis. Left carotid system: Patent. Mild mixed plaque along the proximal internal carotid causing minimal stenosis. Vertebral arteries: Patent. Right vertebral artery slightly dominant. No stenosis or evidence of dissection. Skeleton: Degenerative changes of the cervical spine. Other neck: Unremarkable. Upper chest: No apical lung mass. Review of the MIP images confirms the above findings CTA HEAD Anterior circulation: Intracranial internal carotid arteries are patent with minor calcified plaque. Anterior and middle cerebral arteries are patent. Posterior circulation: Intracranial vertebral arteries are patent. Basilar artery is patent. Major cerebellar artery origins are patent. Posterior communicating arteries are present. Posterior cerebral arteries are patent. Venous sinuses: Patent as allowed by contrast bolus timing. Review of the MIP images confirms the above findings CT Brain Perfusion Findings: ASPECTS: 10 CBF (<30%) Volume: 61mL Perfusion (Tmax>6.0s) volume: 57mL Mismatch Volume: 27mL Infarction Location: None. IMPRESSION: No large vessel occlusion, hemodynamically significant stenosis, or evidence of dissection. Perfusion imaging demonstrates no evidence of core infarction or penumbra. These results were communicated to Dr. Leonie Man at 3:26 pm  on 04/11/2021 by text page via the [facility] messaging system. Electronically Signed   By: Macy Mis M.D.   On: 04/11/2021 15:36   CT HEAD CODE STROKE WO  CONTRAST  Result Date: 04/11/2021 CLINICAL DATA:  Code stroke. EXAM: CT HEAD WITHOUT CONTRAST TECHNIQUE: Contiguous axial images were obtained from the base of the skull through the vertex without intravenous contrast. COMPARISON:  04/08/2021 FINDINGS: Brain: No acute intracranial hemorrhage, mass effect, or edema. No new loss of gray-white differentiation. Patchy hypoattenuation in the supratentorial white matter likely reflects stable chronic microvascular ischemic changes. There is a stable small calcified meningioma along the right parietal convexity. Ventricles and sulci are stable in size and configuration. No extra-axial collection. Vascular: No hyperdense vessel. Skull: Unremarkable. Sinuses/Orbits: No acute finding. Other: Mastoid air cells are clear. ASPECTS ([place] Stroke Program Early CT Score) - Ganglionic level infarction (caudate, lentiform nuclei, internal capsule, insula, M1-M3 cortex): 7 - Supraganglionic infarction (M4-M6 cortex): 3 Total score (0-10 with 10 being normal): 10 IMPRESSION: There is no acute intracranial hemorrhage or evidence of acute infarction. ASPECT score is 10. No significant change since recent prior study. These results were communicated to Dr. Leonie Man at 3:22 pm on 04/11/2021 by text page via the [facility] messaging system. Electronically Signed   By: Macy Mis M.D.   On: 04/11/2021 15:26   CT ANGIO HEAD CODE STROKE  Result Date: 04/11/2021 CLINICAL DATA:  Code stroke follow-up EXAM: CT ANGIOGRAPHY HEAD AND NECK CT PERFUSION BRAIN TECHNIQUE: Multidetector CT imaging of the head and neck was performed using the standard protocol during bolus administration of intravenous contrast. Multiplanar CT image reconstructions and MIPs were obtained to evaluate the vascular anatomy. Carotid stenosis measurements (when applicable) are obtained utilizing NASCET criteria, using the distal internal carotid diameter as the denominator. Multiphase CT imaging of the brain was performed  following IV bolus contrast injection. Subsequent parametric perfusion maps were calculated using RAPID software. CONTRAST:  148mL OMNIPAQUE IOHEXOL 350 MG/ML SOLN COMPARISON:  None. FINDINGS: CTA NECK Aortic arch: Calcified and noncalcified plaque along the arch and patent great vessel origins. No high-grade stenosis of the proximal subclavian arteries. Right carotid system: Patent. Noncalcified plaque along the proximal internal carotid causing less than 50% stenosis. Left carotid system: Patent. Mild mixed plaque along the proximal internal carotid causing minimal stenosis. Vertebral arteries: Patent. Right vertebral artery slightly dominant. No stenosis or evidence of dissection. Skeleton: Degenerative changes of the cervical spine. Other neck: Unremarkable. Upper chest: No apical lung mass. Review of the MIP images confirms the above findings CTA HEAD Anterior circulation: Intracranial internal carotid arteries are patent with minor calcified plaque. Anterior and middle cerebral arteries are patent. Posterior circulation: Intracranial vertebral arteries are patent. Basilar artery is patent. Major cerebellar artery origins are patent. Posterior communicating arteries are present. Posterior cerebral arteries are patent. Venous sinuses: Patent as allowed by contrast bolus timing. Review of the MIP images confirms the above findings CT Brain Perfusion Findings: ASPECTS: 10 CBF (<30%) Volume: 42mL Perfusion (Tmax>6.0s) volume: 15mL Mismatch Volume: 35mL Infarction Location: None. IMPRESSION: No large vessel occlusion, hemodynamically significant stenosis, or evidence of dissection. Perfusion imaging demonstrates no evidence of core infarction or penumbra. These results were communicated to Dr. Leonie Man at 3:26 pm on 04/11/2021 by text page via the [facility] messaging system. Electronically Signed   By: Macy Mis M.D.   On: 04/11/2021 15:36   CT ANGIO NECK CODE STROKE  Result Date: 04/11/2021 CLINICAL DATA:  Code  stroke follow-up EXAM: CT ANGIOGRAPHY  HEAD AND NECK CT PERFUSION BRAIN TECHNIQUE: Multidetector CT imaging of the head and neck was performed using the standard protocol during bolus administration of intravenous contrast. Multiplanar CT image reconstructions and MIPs were obtained to evaluate the vascular anatomy. Carotid stenosis measurements (when applicable) are obtained utilizing NASCET criteria, using the distal internal carotid diameter as the denominator. Multiphase CT imaging of the brain was performed following IV bolus contrast injection. Subsequent parametric perfusion maps were calculated using RAPID software. CONTRAST:  136mL OMNIPAQUE IOHEXOL 350 MG/ML SOLN COMPARISON:  None. FINDINGS: CTA NECK Aortic arch: Calcified and noncalcified plaque along the arch and patent great vessel origins. No high-grade stenosis of the proximal subclavian arteries. Right carotid system: Patent. Noncalcified plaque along the proximal internal carotid causing less than 50% stenosis. Left carotid system: Patent. Mild mixed plaque along the proximal internal carotid causing minimal stenosis. Vertebral arteries: Patent. Right vertebral artery slightly dominant. No stenosis or evidence of dissection. Skeleton: Degenerative changes of the cervical spine. Other neck: Unremarkable. Upper chest: No apical lung mass. Review of the MIP images confirms the above findings CTA HEAD Anterior circulation: Intracranial internal carotid arteries are patent with minor calcified plaque. Anterior and middle cerebral arteries are patent. Posterior circulation: Intracranial vertebral arteries are patent. Basilar artery is patent. Major cerebellar artery origins are patent. Posterior communicating arteries are present. Posterior cerebral arteries are patent. Venous sinuses: Patent as allowed by contrast bolus timing. Review of the MIP images confirms the above findings CT Brain Perfusion Findings: ASPECTS: 10 CBF (<30%) Volume: 54mL Perfusion  (Tmax>6.0s) volume: 9mL Mismatch Volume: 82mL Infarction Location: None. IMPRESSION: No large vessel occlusion, hemodynamically significant stenosis, or evidence of dissection. Perfusion imaging demonstrates no evidence of core infarction or penumbra. These results were communicated to Dr. Leonie Man at 3:26 pm on 04/11/2021 by text page via the [facility] messaging system. Electronically Signed   By: Macy Mis M.D.   On: 04/11/2021 15:36       ROS Blood pressure (!) 174/83, pulse (!) 104, temperature 99 F (37.2 C), temperature source Rectal, resp. rate (!) 23, height 5\' 2"  (1.575 m), weight 58.4 kg, SpO2 96 %. Physical Exam Frail elderly Caucasian lady not in distress. . Afebrile. Head is nontraumatic. Neck is supple without bruit.    Cardiac exam no murmur or gallop. Lungs are clear to auscultation. Distal pulses are well felt.  Neurological Exam :  Patient is awake alert but globally aphasic with being mute and unable to speak any words.  She does follow occasional midline and one-step commands but not consistently.  She has left gaze deviation can track To the right past midline but not all the way.  Face appears symmetric versus not cooperative.  Tongue midline motor system exam she has point tenderness antigravity movements in all 4 extremities that does not appear to be focal weakness.  She withdraws to painful stimuli in all 4 extremities.  Both plantars are downgoing.  Gait not tested. She does have intermittent tremulous movements of her head and both outstretched upper extremities.  Assessment/Plan: 85 year old Caucasian lady admitted for altered mental status thought to be encephalopathy related to urinary tract infection who was doing well and has had sudden onset of expressive greater than receptive aphasia without extremity weakness.  Differential includes left MCA branch infarct versus seizures with postictal state versus ongoing encephalopathy from UTI medication effect  ceftriaxone.  Recommend  IV tPA administration given relatively sudden onset of profound disabling aphasia.  I discussed risk benefit with the patient's  son phone and reviewed inclusion/exclusion criteria.   Admit to neurological intensive care unit for close neurological monitoring and strict blood pressure control as per post tPA protocol.  Check MRI scan of the brain.   Check stat EEG is abnormal mainly overnight EEG monitoring.  Low threshold for starting anticonvulsants if has seizure-like movements. Discontinue ceftriaxone consider alternative antibiotics for UTI. Long discussion with son over the phone and with Dr. Sloan Leiter and answered questions This patient is critically ill and at significant risk of neurological worsening, death and care requires constant monitoring of vital signs, hemodynamics,respiratory and cardiac monitoring, extensive review of multiple databases, frequent neurological assessment, discussion with family, other specialists and medical decision making of high complexity.I have made any additions or clarifications directly to the above note.This critical care time does not reflect procedure time, or teaching time or supervisory time of PA/NP/Med Resident etc but could involve care discussion time.  I spent 60 minutes of neurocritical care time  in the care of  this patient.     Antony Contras 04/11/2021, 5:11 PM    Note: This document was prepared with digital dictation and possible smart phrase technology. Any transcriptional errors that result from this process are unintentional.

## 2021-04-11 NOTE — Procedures (Signed)
Patient Name: Tracey Armstrong  MRN: [id number]  Epilepsy Attending: Lora Havens  Referring Physician/Provider: Anibal Henderson, NP Date: 04/11/2021 Duration: 20.25 mins  Patient history: 86yo F with sudden worsening of mental status. EEG to evaluate for seizure  Level of alertness: Awake  AEDs during EEG study: None  Technical aspects: This EEG study was done with scalp electrodes positioned according to the 10-20 International system of electrode placement. Electrical activity was acquired at a sampling rate of 500Hz  and reviewed with a high frequency filter of 70Hz  and a low frequency filter of 1Hz . EEG data were recorded continuously and digitally stored.   Description: No posterior dominant rhythm was seen. EEG initially showed generalized, maximal bifrontal periodic epileptiform  discharges at 3hz  consistent with convulsive status epilepticus. On video, no clnical signs were noted. EEG spontaneously improved after 1645. EEG then showed continuous generalized 3 to 6 Hz theta-delta slowing. Iv ativan 2mg  was administered at 1648 after which eeg showed generalized and lateralized right hemisphere 3-6hz  theta-delta slowing admixed with 15-18hz  generalized beta activity. Hyperventilation and photic stimulation were not performed.     ABNORMALITY - Non convulsive status epilepticus, generalized  - Continuous slow, generalized - Excessive beta, generalized  IMPRESSION: This study was initially suggestive of non convulsive status epilepticus which improved after 1645. EEG was then suggestive of cortical dysfunction arising from right hemisphere, likely secondary to underlying structural abnormality, post-ictal state. Additionally there is moderate diffuse encephalopathy, nonspecific etiology but likely related to sedation, seizure.   Dr Lorrin Goodell was notified.  Aidynn Polendo Barbra Sarks

## 2021-04-11 NOTE — Progress Notes (Addendum)
Patient arrived to 4NICU from [facility] receiving TPA at 1532.  Bedside report was given by 3E RN, Stroke RN, and Rapid RN.  Patient followed commands, able to answer basic questions, and moving all extremities during 1600 check with Stroke Response RN.  At 1615 RN assess patient and patient is mute, not following commands, and not withdrawing to pain. Patient also has some tremors.  EEG tech currently setting up EEG.  Neurology paged and updated on neuro change.  TPA flush currently running and was told to stop per Neurologist.  Keppra order by Neurologist.  STAT Head CT to be completed after EEG.     1655:  EEG tech updated Dr. Hortense Ramal.  See provider note.  Verbal order to give 2 mg ativan.  Patient will be hook up to Continuous EEG and RN will take patient for STAT CT afterward.  Neurologist and Dr. Hortense Ramal aware and updated.    1735:  STAT CT completed.  Neurology paged and updated. Pt back on LEEG.

## 2021-04-11 NOTE — Progress Notes (Signed)
Belongings on transfer:  Games developer (no wallet or money)

## 2021-04-11 NOTE — Progress Notes (Signed)
Pharmacist Code Stroke Response  Notified to mix tPA at 1526 by Dr. Leonie Man Delivered tPA to RN at 1531  tPA dose = 5.3mg  bolus over 1 minute followed by 47.3mg  for a total dose of 52.6mg  over 1 hour  Issues/delays encountered (if applicable): None   Albertina Parr, PharmD., BCPS, BCCCP Clinical Pharmacist Please refer to [facility] for unit-specific pharmacist

## 2021-04-11 NOTE — Progress Notes (Signed)
Brief Neuro Update:  Notified by RN about seizure like activity with head twitching and Left sided twitching with eyes deviated to the right. tPA was completed, flush and I asked to stop the flush. She was being hooked up to the cEEG.  She was protecting her airway so Keppra 1000mg  Iv once was given along with Ativan 2mg  with resolution of seizures.  Recs: - tPA was stopped. Dicussed with Dr. Leonie Man, this is likely not a stroke and probably a seizure. - STAT CTH was obtained and was negative for an ICH - tPA flush was not resumed 2/2 low suspicion for stroke - cEEG obtained and notified by Dr. Hortense Ramal that the study initial ly is suggestive of non convulsive status epilepticus which improved after 1645.  [place] Pager Number [phone]

## 2021-04-11 NOTE — Progress Notes (Signed)
Called to the room by co-worker find patient slump over on her right  side Patient .not  responding  to nurse question RR called. took patient to CT, MD paged neuro doctor at bedside. See epic for order and intervention. Report given to 4N nurse. Patient transfer to 4N per order.

## 2021-04-11 NOTE — Progress Notes (Signed)
LTM started; pt heading to STAT CT; advised [facility] LTM should start directly after CT. Nurse educated on how to reconnect LTM.

## 2021-04-11 NOTE — Progress Notes (Signed)
PROGRESS NOTE                                                                                                                                                                                                             Patient Demographics:    Tracey Armstrong, is a 86 y.o. female, DOB - 06-28-1935, QXI:[id number]  Outpatient Primary MD for the patient is Martin[place], [name], MD   Admit date - 04/08/2021   LOS - 2  No chief complaint on file.      Brief Narrative: Patient is a 86 y.o. female with PMHx of depression, HTN, chronic pain, anemia-who presented from her independent living facility for confusion  COVID-19 vaccinated status: Vaccinated including booster  Significant Events: 6/20>> Admit to [facility] for evaluation of confusion  Significant studies: 6/20>>Chest x-ray: No pneumonia 6/20>> CT head: No acute intracranial abnormality. 6/21>> MRI brain: 1 cm calcified meningioma over the right parietal convexity without any mass-effect. 6/21>> CT abdomen/pelvis: Mild thickening of the urinary bladder, moderate volume stool  COVID-19 medications: None  Antibiotics: Rocephin: 6/20>>  Microbiology data: 6/21>> urine culture: E. coli, Klebsiella pneumoniae.  Procedures: None  Consults: None  DVT prophylaxis: enoxaparin (LOVENOX) injection 40 mg Start: 04/09/21 1000    Subjective:   Completely awake and alert this morning-more fluent with answers.  Per nursing staff-she had some delirium last night.   Assessment  & Plan :   Acute metabolic encephalopathy: Etiology unclear (multiple culprits)--recently diagnosed with COVID-elevated BP on admission-on as needed benzodiazepines/Zanaflex-possible UTI-CT head without any structural abnormalities-encephalopathy has improved with supportive care-we will go ahead and resume Xanax/Zanaflex/Neurontin at half of home doses-and see how she does.  TSH/vitamin B12 stable.  Remains  on IV Rocephin as well.    Hypertensive urgency: BP although better-still on the higher side-increase verapamil to 180 mg.  Reassess 6/24.  Hypokalemia: Replete and recheck.  UTI: Afebrile-mentation has improved-continue IV Rocephin.  COVID-19 infection: Diagnosed on 6/14-Per H&P-cycle threshold of 38.6-no URI symptoms-no pneumonia on imaging-not hypoxic.  Apart from observation-doubt she requires any therapy at this point.  Continue isolation for total of 10 days from day of diagnosis.  Meningioma: Incidentally seen on MRI brain-stable for outpatient monitoring/follow-up  GERD: Continue PPI  Chronic back pain: Neurontin/Zanaflex initially held-but since mental status is improved-resumed both Neurontin and Zanaflex at half of her usual  dosing.   Anxiety/insomnia: On Xanax as outpatient-initially held due to mental status change-since improved-have resumed Xanax at half of home dosing.  Constipation: Continue MiraLAX/senna-add as needed Dulcolax if no BM over the next few days.  ABG: No results found for: PHART, PCO2ART, PO2ART, HCO3, TCO2, ACIDBASEDEF, O2SAT  Vent Settings: N/A  Condition - Stable  Family Communication  :  Son-Quint [phone] voicemail left on 6/23  Code Status :  DNR  Diet :  Diet Order             Diet Heart Room service appropriate? Yes; Fluid consistency: Thin  Diet effective now                    Disposition Plan  :   Status is: Inpatient  Remains inpatient appropriate because:Inpatient level of care appropriate due to severity of illness  Dispo: The patient is from:  ILF              Anticipated d/c is to:  ILF              Patient currently is not medically stable to d/c.   Difficult to place patient No    Barriers to discharge: Resolving encephalopathy-awaiting culture data  Antimicorbials  :    Anti-infectives (From admission, onward)    Start     Dose/Rate Route Frequency Ordered Stop   04/10/21 0800  cefTRIAXone  (ROCEPHIN) 2 g in sodium chloride 0.9 % 100 mL IVPB        2 g 200 mL/hr over 30 Minutes Intravenous Every 24 hours 04/09/21 0744 04/11/21 1022   04/09/21 0300  cefTRIAXone (ROCEPHIN) 2 g in sodium chloride 0.9 % 100 mL IVPB        2 g 200 mL/hr over 30 Minutes Intravenous  Once 04/09/21 0257 04/09/21 0447       Inpatient Medications  Scheduled Meds:  enoxaparin (LOVENOX) injection  40 mg Subcutaneous Daily   gabapentin  100 mg Oral BID   oxybutynin  10 mg Oral QHS   pantoprazole  40 mg Oral QAC breakfast   polyethylene glycol  17 g Oral Daily   pyridOXINE  400 mg Oral Q lunch   senna-docusate  1 tablet Oral BID   verapamil  120 mg Oral Daily   Continuous Infusions:   PRN Meds:.acetaminophen, ALPRAZolam, bisacodyl, hydrALAZINE, tiZANidine   Time Spent in minutes  25   See all Orders from today for further details   Oren Binet M.D on 04/11/2021 at 2:10 PM  To page go to [email or web address] - use universal password  [facility] -  Office  [phone]    Objective:   Vitals:   04/11/21 0103 04/11/21 0144 04/11/21 0442 04/11/21 0749  BP: (!) 168/75 (!) 168/75 (!) 168/75 (!) 172/84  Pulse: (!) 108 (!) 108 (!) 108 (!) 101  Resp: 18 18 18    Temp: 98.2 F (36.8 C) 98.2 F (36.8 C) 98.2 F (36.8 C) 98.2 F (36.8 C)  TempSrc:   Oral Oral  SpO2:   97% 98%  Weight:      Height:        Wt Readings from Last 3 Encounters:  04/11/21 58.4 kg  04/02/21 62.1 kg  12/12/20 62.1 kg     Intake/Output Summary (Last 24 hours) at 04/11/2021 1410 Last data filed at 04/11/2021 1241 Gross per 24 hour  Intake 720 ml  Output 200 ml  Net 520 ml      Physical Exam  Gen Exam:Alert awake-not in any distress HEENT:atraumatic, normocephalic Chest: B/L clear to auscultation anteriorly CVS:S1S2 regular Abdomen:soft non tender, non distended Extremities:no edema Neurology: Non focal Skin: no rash    Data Review:    CBC Recent Labs  Lab 04/08/21 1928  04/09/21 0447 04/10/21 0307 04/11/21 0300  WBC 10.1 8.4 9.7 7.8  HGB 12.6 11.8* 11.8* 11.2*  HCT 38.4 36.1 35.0* 33.1*  PLT 313 270 294 240  MCV 95.5 96.8 95.4 94.0  MCH 31.3 31.6 32.2 31.8  MCHC 32.8 32.7 33.7 33.8  RDW 13.5 13.5 13.6 13.5  LYMPHSABS 1.2 1.7 1.6 2.1  MONOABS 0.6 0.6 0.6 0.6  EOSABS 0.0 0.0 0.0 0.0  BASOSABS 0.0 0.0 0.0 0.0     Chemistries  Recent Labs  Lab 04/08/21 1928 04/09/21 0447 04/10/21 0307 04/11/21 0300  NA 138 136 133* 134*  K 4.2 4.0 3.4* 3.3*  CL 102 104 103 102  CO2 22 23 19* 21*  GLUCOSE 150* 143* 129* 112*  BUN 13 12 13 11   CREATININE 0.94 0.86 0.75 0.80  CALCIUM 9.4 8.9 8.9 8.9  MG  --  1.8 1.8 1.8  AST 25 21  --   --   ALT 16 14  --   --   ALKPHOS 42 41  --   --   BILITOT 0.8 0.5  --   --     ------------------------------------------------------------------------------------------------------------------ No results for input(s): CHOL, HDL, LDLCALC, TRIG, CHOLHDL, LDLDIRECT in the last 72 hours.  Lab Results  Component Value Date   HGBA1C 5.6 04/09/2021   ------------------------------------------------------------------------------------------------------------------ Recent Labs    04/09/21 0447  TSH 1.163    ------------------------------------------------------------------------------------------------------------------ Recent Labs    04/09/21 0447  VITAMINB12 1,454*  FOLATE 5.2*     Coagulation profile Recent Labs  Lab 04/08/21 1928  INR 1.1     No results for input(s): DDIMER in the last 72 hours.  Cardiac Enzymes No results for input(s): CKMB, TROPONINI, MYOGLOBIN in the last 168 hours.  Invalid input(s): CK ------------------------------------------------------------------------------------------------------------------ No results found for: BNP  Micro Results Recent Results (from the past 240 hour(s))  Resp Panel by RT-PCR (Flu A&B, Covid) Nasopharyngeal Swab     Status: Abnormal   Collection  Time: 04/02/21  3:20 PM   Specimen: Nasopharyngeal Swab; Nasopharyngeal(NP) swabs in vial transport medium  Result Value Ref Range Status   SARS Coronavirus 2 by RT PCR POSITIVE (A) NEGATIVE Final    Comment: RESULT CALLED TO, READ BACK BY AND VERIFIED WITHHarvel Quale RN 1720 04/02/21 A BROWNING (NOTE) SARS-CoV-2 target nucleic acids are DETECTED.  The SARS-CoV-2 RNA is generally detectable in upper respiratory specimens during the acute phase of infection. Positive results are indicative of the presence of the identified virus, but do not rule out bacterial infection or co-infection with other pathogens not detected by the test. Clinical correlation with patient history and other diagnostic information is necessary to determine patient infection status. The expected result is Negative.  Fact Sheet for Patients: EntrepreneurPulse.com.au  Fact Sheet for Healthcare Providers: IncredibleEmployment.be  This test is not yet approved or cleared by the [place] FDA and  has been authorized for detection and/or diagnosis of SARS-CoV-2 by FDA under an Emergency Use Authorization (EUA).  This EUA will remain in effect (meaning this test can  be used) for the duration of  the COVID-19 declaration under Section 564(b)(1) of the Act, 21 U.S.C. section 360bbb-3(b)(1), unless the authorization is terminated or revoked sooner.     Influenza  A by PCR NEGATIVE NEGATIVE Final   Influenza B by PCR NEGATIVE NEGATIVE Final    Comment: (NOTE) The Xpert Xpress SARS-CoV-2/FLU/RSV plus assay is intended as an aid in the diagnosis of influenza from Nasopharyngeal swab specimens and should not be used as a sole basis for treatment. Nasal washings and aspirates are unacceptable for Xpert Xpress SARS-CoV-2/FLU/RSV testing.  Fact Sheet for Patients: EntrepreneurPulse.com.au  Fact Sheet for Healthcare  Providers: IncredibleEmployment.be  This test is not yet approved or cleared by the [place] FDA and has been authorized for detection and/or diagnosis of SARS-CoV-2 by FDA under an Emergency Use Authorization (EUA). This EUA will remain in effect (meaning this test can be used) for the duration of the COVID-19 declaration under Section 564(b)(1) of the Act, 21 U.S.C. section 360bbb-3(b)(1), unless the authorization is terminated or revoked.  Performed at [facility], [place] [address], [place], [place] [address]   Urine culture     Status: Abnormal (Preliminary result)   Collection Time: 04/09/21  2:00 AM   Specimen: In/Out Cath Urine  Result Value Ref Range Status   Specimen Description IN/OUT CATH URINE  Final   Special Requests NONE  Final   Culture (A)  Final    >=100,000 COLONIES/mL ESCHERICHIA COLI >=100,000 COLONIES/mL KLEBSIELLA PNEUMONIAE SUSCEPTIBILITIES TO FOLLOW Performed at [facility], [place] [address], [place], [place] [address]    Report Status PENDING  Incomplete    Radiology Reports CT Head Wo Contrast  Result Date: 04/08/2021 CLINICAL DATA:  Mental status change EXAM: CT HEAD WITHOUT CONTRAST TECHNIQUE: Contiguous axial images were obtained from the base of the skull through the vertex without intravenous contrast. COMPARISON:  CT brain 06/02/2020 FINDINGS: Brain: No acute territorial infarction, hemorrhage or new intracranial mass. Right parietal convexity calcified meningioma. Mild atrophy. Mild to moderate chronic small vessel ischemic change of the white matter. Stable ventricle size. Vascular: No hyperdense vessels.  Carotid vascular calcification Skull: Normal. Negative for fracture or focal lesion. Sinuses/Orbits: Patchy mucosal disease in the ethmoid sinus Other: None IMPRESSION: 1. No CT evidence for acute intracranial abnormality. 2. Atrophy and chronic small vessel ischemic change of the white matter Electronically Signed    By: Donavan Foil M.D.   On: 04/08/2021 19:05   MR BRAIN WO CONTRAST  Result Date: 04/09/2021 CLINICAL DATA:  Initial evaluation for acute mental status change. EXAM: MRI HEAD WITHOUT CONTRAST TECHNIQUE: Multiplanar, multiecho pulse sequences of the brain and surrounding structures were obtained without intravenous contrast. COMPARISON:  Prior head CT from 04/08/2021. FINDINGS: Brain: Cerebral volume within normal limits for age. Patchy T2/FLAIR hyperintensity within the periventricular and deep white matter both cerebral hemispheres most consistent with chronic small vessel ischemic disease, mild for age. No abnormal foci of restricted diffusion to suggest acute or subacute ischemia. Gray-white matter differentiation maintained. No encephalomalacia to suggest chronic cortical infarction. No evidence for acute or chronic intracranial hemorrhage. 1 cm calcified meningioma overlying the right parietal convexity again noted, relatively stable from previous. No associated mass effect. No other mass lesion, midline shift or mass effect. No hydrocephalus or extra-axial fluid collection. Pituitary gland suprasellar region within normal limits. Midline structures intact. Vascular: Major intracranial vascular flow voids are maintained. Skull and upper cervical spine: Craniocervical junction within normal limits. Bone marrow signal intensity normal. No scalp soft tissue abnormality. Sinuses/Orbits: Patient status post bilateral ocular lens replacement. Globes and orbital soft tissues demonstrate no acute finding. Scattered mucosal thickening noted within the ethmoidal air cells. Paranasal  sinuses are otherwise largely clear. No mastoid effusion. Inner ear structures grossly normal. Other: None. IMPRESSION: 1. No acute intracranial abnormality. 2. Mild age-related cerebral atrophy with chronic microvascular ischemic disease. 3. 1 cm calcified meningioma overlying the right parietal convexity without associated mass  effect, stable. Electronically Signed   By: Jeannine Boga M.D.   On: 04/09/2021 01:38   CT ABDOMEN PELVIS W CONTRAST  Result Date: 04/09/2021 CLINICAL DATA:  Abdominal pain, acute, nonlocalized, concern for UTI EXAM: CT ABDOMEN AND PELVIS WITH CONTRAST TECHNIQUE: Multidetector CT imaging of the abdomen and pelvis was performed using the standard protocol following bolus administration of intravenous contrast. CONTRAST:  35mL OMNIPAQUE IOHEXOL 300 MG/ML  SOLN COMPARISON:  CT lumbar spine April 04, 2020. FINDINGS: Lower chest: Bibasilar atelectasis. Coronary artery calcifications. Normal size heart. No significant pericardial effusion/thickening. Moderate hiatal hernia. Hepatobiliary: No suspicious hepatic lesion. Subcentimeter hypodense lesion in the posterior right hepatic lobe on image 20/3 too small to accurately characterize but favored represent cysts. Gallbladder is unremarkable. No biliary ductal dilation. Pancreas: Within normal limits. Spleen: Normal size spleen. 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 Adrenals/Urinary Tract: Mild bilateral adrenal thickening, favor hyperplasia. No hydronephrosis. Although evaluation is slightly limited by respiratory motion there appear to be 2 focal areas of relative hypoenhancement of the right renal parenchyma for involving the lower pole on image 56/6 and upper pole on image 60/6. There is some right-sided urothelial hyperenhancement and adjacent stranding involving the renal pelvis. No solid enhancing renal lesions. Mild thickening of the urinary bladder Stomach/Bowel: Moderate-sized hiatal hernia otherwise the stomach is grossly unremarkable. No pathologic dilation of small bowel. The appendix and terminal ileum are grossly unremarkable. Sigmoid colonic diverticulosis without findings of acute diverticulitis. Rectum  is distended with a moderate volume of stool with rectal wall thickening and adjacent inflammatory stranding Vascular/Lymphatic: Aortic atherosclerosis without aneurysmal dilation. Prominent periportal lymph nodes measuring up to 6 mm. No pathologically enlarged abdominal or pelvic lymph nodes. Reproductive: Uterus and bilateral adnexa are unremarkable. Other: No abdominopelvic ascites. Musculoskeletal: Diffuse demineralization of bone. Increased height loss involving the L1 vertebral body compression deformity now with near complete vertebral plana. T9, L3 and L4 vertebral body hemangiomas. Diffuse demineralization of bone. Multilevel degenerative changes spine. Partially visualized left femoral intramedullary fixation rod. IMPRESSION: 1. Mild thickening of the urinary bladder wall which may represent cystitis. Additionally although evaluation is slightly limited by respiratory motion there 2 apparent areas of relative hypoenhancement of the right renal parenchyma with some urothelial hyperenhancement and adjacent inflammatory stranding of the renal pelvis, findings which may reflect pyelonephritis. 2. Rectum is distended with a moderate volume of stool and rectal wall thickening with adjacent inflammatory stranding, suggestive of stercoral colitis. 3. Increased height loss involving the chronic L1 vertebral body compression deformity now with near complete vertebral plana. 4. Moderate-sized hiatal hernia. 5. Sigmoid colonic diverticulosis without findings of acute diverticulitis. 6. Aortic atherosclerosis. Aortic Atherosclerosis (ICD10-I70.0). Electronically Signed   By: Dahlia Bailiff MD   On: 04/09/2021 09:16   DG Chest Port 1 View  Result Date: 04/08/2021 CLINICAL DATA:  Pt arrives via [facility] from [facility] c/o AMS and possible UTI. Pt has no hx of memory issues EXAM: PORTABLE CHEST 1 VIEW COMPARISON:  04/02/2021. FINDINGS: Cardiac silhouette partly obscured but grossly normal in size. No  mediastinal or hilar masses. Bilateral prominent interstitial markings. Additional  opacity at the left lung base is noted consistent with atelectasis with a possible small effusion. Lungs otherwise clear. No convincing right pleural effusion and no pneumothorax. Skeletal structures are grossly intact. IMPRESSION: No acute cardiopulmonary disease. Electronically Signed   By: Lajean Manes M.D.   On: 04/08/2021 18:19   DG Chest Portable 1 View  Result Date: 04/02/2021 CLINICAL DATA:  Hypotension and dizziness EXAM: PORTABLE CHEST 1 VIEW COMPARISON:  June 02, 2020 FINDINGS: There is slight left base atelectasis. Lungs elsewhere are clear. Heart size and pulmonary vascularity are normal. No adenopathy. There is aortic atherosclerosis. No bone lesions. IMPRESSION: Left base atelectasis. No edema or airspace opacity. Heart size normal. Aortic Atherosclerosis (ICD10-I70.0). Electronically Signed   By: Lowella Grip III M.D.   On: 04/02/2021 15:58

## 2021-04-12 ENCOUNTER — Inpatient Hospital Stay (HOSPITAL_COMMUNITY): Payer: Medicare Other

## 2021-04-12 DIAGNOSIS — I6389 Other cerebral infarction: Secondary | ICD-10-CM

## 2021-04-12 LAB — URINE CULTURE: Culture: >=100000 — AB

## 2021-04-12 LAB — COMPREHENSIVE METABOLIC PANEL
ALT: 42 U/L (ref 0–44)
AST: 34 U/L (ref 15–41)
Albumin: 2.9 g/dL — ABNORMAL LOW (ref 3.5–5.0)
Alkaline Phosphatase: 33 U/L — ABNORMAL LOW (ref 38–126)
Anion gap: 8 (ref 5–15)
BUN: 9 mg/dL (ref 8–23)
CO2: 24 mmol/L (ref 22–32)
Calcium: 9 mg/dL (ref 8.9–10.3)
Chloride: 102 mmol/L (ref 98–111)
Creatinine, Ser: 0.8 mg/dL (ref 0.44–1.00)
GFR, Estimated: >60 mL/min (ref >60)
Glucose, Bld: 87 mg/dL (ref 70–99)
Potassium: 3.9 mmol/L (ref 3.5–5.1)
Sodium: 134 mmol/L — ABNORMAL LOW (ref 135–145)
Total Bilirubin: 0.3 mg/dL (ref 0.3–1.2)
Total Protein: 5.1 g/dL — ABNORMAL LOW (ref 6.5–8.1)

## 2021-04-12 LAB — CBC WITH DIFFERENTIAL/PLATELET
Abs Immature Granulocytes: 0.03 10*3/uL (ref 0.00–0.07)
Basophils Absolute: 0 10*3/uL (ref 0.0–0.1)
Basophils Relative: 0 %
Eosinophils Absolute: 0 10*3/uL (ref 0.0–0.5)
Eosinophils Relative: 0 %
HCT: 29.8 % — ABNORMAL LOW (ref 36.0–46.0)
Hemoglobin: 10.2 g/dL — ABNORMAL LOW (ref 12.0–15.0)
Immature Granulocytes: 0 %
Lymphocytes Relative: 37 %
Lymphs Abs: 2.6 10*3/uL (ref 0.7–4.0)
MCH: 32.2 pg (ref 26.0–34.0)
MCHC: 34.2 g/dL (ref 30.0–36.0)
MCV: 94 fL (ref 80.0–100.0)
Monocytes Absolute: 0.7 10*3/uL (ref 0.1–1.0)
Monocytes Relative: 10 %
Neutro Abs: 3.7 10*3/uL (ref 1.7–7.7)
Neutrophils Relative %: 53 %
Platelets: 206 10*3/uL (ref 150–400)
RBC: 3.17 MIL/uL — ABNORMAL LOW (ref 3.87–5.11)
RDW: 13.4 % (ref 11.5–15.5)
WBC: 7.1 10*3/uL (ref 4.0–10.5)
nRBC: 0 % (ref 0.0–0.2)

## 2021-04-12 LAB — ECHOCARDIOGRAM COMPLETE
Area-P 1/2: 3.53 cm2
Calc EF: 57.5 %
Height: 62 in
S' Lateral: 2.4 cm
Single Plane A2C EF: 55.2 %
Single Plane A4C EF: 54.3 %
Weight: 2059.98 oz

## 2021-04-12 LAB — HEMOGLOBIN A1C
Hgb A1c MFr Bld: 5.2 % (ref 4.8–5.6)
Mean Plasma Glucose: 102.54 mg/dL

## 2021-04-12 LAB — LIPID PANEL
Cholesterol: 207 mg/dL — ABNORMAL HIGH (ref 0–200)
HDL: 70 mg/dL (ref >40)
LDL Cholesterol: 121 mg/dL — ABNORMAL HIGH (ref 0–99)
Total CHOL/HDL Ratio: 3 RATIO
Triglycerides: 80 mg/dL (ref <150)
VLDL: 16 mg/dL (ref 0–40)

## 2021-04-12 LAB — MAGNESIUM: Magnesium: 1.9 mg/dL (ref 1.7–2.4)

## 2021-04-12 MED ORDER — SODIUM CHLORIDE 0.9 % IV SOLN
1.0000 g | INTRAVENOUS | Status: DC
  Administered 2021-04-12 – 2021-04-13 (×2): 1000 mg via INTRAVENOUS
  Filled 2021-04-12 (×4): qty 1

## 2021-04-12 NOTE — Progress Notes (Signed)
LTM disconnected; minor skin irritation behind A1.

## 2021-04-12 NOTE — Progress Notes (Signed)
LTM maintenance completed; no skin breakdown was seen.

## 2021-04-12 NOTE — Progress Notes (Signed)
MRI not able to get pt in before post 24 hr TPA time of 1532.  Verbal order from Dr. Leonie Man to get CT w/o contrast now and MRI when available.

## 2021-04-12 NOTE — Progress Notes (Addendum)
PROGRESS NOTE                                                                                                                                                                                                             Patient Demographics:    Tracey Armstrong, is a 86 y.o. female, DOB - [birth year], BOF:[id number]  Outpatient Primary MD for the patient is Armstrong, Tracey G, MD   Admit date - 04/08/2021   LOS - 3  No chief complaint on file.      Brief Narrative: Patient is a 86 y.o. female with PMHx of depression, HTN, chronic pain, anemia-who presented from her independent living facility for confusion  COVID-19 vaccinated status: Vaccinated including booster  Significant Events: 6/20>> Admit to [facility] for evaluation of confusion 6/23>> sudden onset of aphasia-code stroke called-neuro started tPA-moved to [place] then had seizure-like activity with positive EEG.  Started on Keppra.  Significant studies: 6/20>>Chest x-ray: No pneumonia 6/20>> CT head: No acute intracranial abnormality. 6/21>> MRI brain: 1 cm calcified meningioma over the right parietal convexity without any mass-effect. 6/21>> CT abdomen/pelvis: Mild thickening of the urinary bladder, moderate volume stool 6/23>> CT head-code stroke: No ICH-no evidence of acute infarction. 6/23>> CT angio head/neck: No LVO. 6/23>> spot EEG: Nonconvulsive status epilepticus 6/23>> CT head: No acute abnormality 6/23-6/24>> LTM EEG: Nonspecific cortical dysfunction arising from right hemisphere, moderate diffuse encephalopathy  COVID-19 medications: None  Antibiotics: Rocephin: 6/20>> 6/23 Ertapenem: 6/24>>  Microbiology data: 6/21>> urine culture: E. coli, Klebsiella pneumoniae-ESBL 6/23>> blood culture: Pending  Procedures: None  Consults: None  DVT prophylaxis: SCD's Start: 04/11/21 1549    Subjective:   Completely awake and alert this morning.    Assessment  & Plan :   Acute metabolic encephalopathy likely due to new onset seizures-status epilepticus: Initially etiology was felt to be multifactorial (COVID/elevated BP/medications/UTI)-however on 6/23-observed to have aphasia with left gaze preference-initially thought to have CVA-and started on tPA-but when she was moved to the ICU-she was found to have seizures-and was likely in status epilepticus.  Started on Keppra-with significant clinical improvement.  Await further recommendations from neurology-continuous EEG currently in progress.  Neurology planning to repeat MRI brain today.  Note-CVA now ruled out.  Fever: Febrile last night-given new onset seizures-concern that this could be encephalitis-however clinically patient appears to be remarkably well-recent MRI on  admission was without features consistent with encephalitis.  Furthermore-she does have dysuria/polyuria-her urine culture has come back positive for E. coli/Klebsiella-ESBL positive.  Suspect her fever is likely due to complicated UTI-spoke with ID (Dr Vu)-recommendations are for ertapenem x7 days.  Subsequently spoke with Dr. Carilyn Goodpasture MD-monitor with IV antibiotics-if fever reoccurs/encephalopathy reoccurs-can contemplate lumbar puncture at some point however currently contraindicated due to the fact that patient received tPA on 6/23.  HTN: BP controlled-on verapamil.  Hypokalemia: Repleted  UTI: See above  COVID-19 infection: Diagnosed on 6/14-Per H&P-cycle threshold of 38.6-no URI symptoms-no pneumonia on imaging-not hypoxic.  Apart from observation-doubt she requires any therapy at this point.  Continue isolation for total of 10 days from day of diagnosis.  Meningioma: Incidentally seen on MRI brain-stable for outpatient monitoring/follow-up  GERD: Continue PPI  Chronic back pain: Neurontin/Zanaflex initially held-but since mental status is improved-resumed both Neurontin and Zanaflex at half of her usual dosing.    Anxiety/insomnia: On Xanax as outpatient-initially held due to mental status change-since improved-have resumed Xanax at half of home dosing.  Constipation: Continue MiraLAX/senna-add as needed Dulcolax if no BM over the next few days.  Chronic tremors  ABG: No results found for: PHART, PCO2ART, PO2ART, HCO3, TCO2, ACIDBASEDEF, O2SAT  Vent Settings: N/A  Condition - Stable  Family Communication  :  Son-Quint Stailey-[phone] on 6/24  Code Status :  DNR-note-spoke with son Glynna Failla on 6/24-reconfirmed DNR.  Diet :  Diet Order             Diet Heart Room service appropriate? Yes; Fluid consistency: Thin  Diet effective now                    Disposition Plan  :   Status is: Inpatient  Remains inpatient appropriate because:Inpatient level of care appropriate due to severity of illness  Dispo: The patient is from:  ILF              Anticipated d/c is to:  ILF              Patient currently is not medically stable to d/c.   Difficult to place patient No    Barriers to discharge: Seizure/status epilepticus-continuous EEG in progress-needs continuous inpatient monitoring for the next few days.  Also on IV Invanz for complicated ESBL UTI.  Antimicorbials  :    Anti-infectives (From admission, onward)    Start     Dose/Rate Route Frequency Ordered Stop   04/10/21 0800  cefTRIAXone (ROCEPHIN) 2 g in sodium chloride 0.9 % 100 mL IVPB        2 g 200 mL/hr over 30 Minutes Intravenous Every 24 hours 04/09/21 0744 04/11/21 1022   04/09/21 0300  cefTRIAXone (ROCEPHIN) 2 g in sodium chloride 0.9 % 100 mL IVPB        2 g 200 mL/hr over 30 Minutes Intravenous  Once 04/09/21 0257 04/09/21 0447       Inpatient Medications  Scheduled Meds:   stroke: mapping our early stages of recovery book   Does not apply Once   Chlorhexidine Gluconate Cloth  6 each Topical Q0600   gabapentin  100 mg Oral BID   oxybutynin  10 mg Oral QHS   pantoprazole (PROTONIX) IV  40 mg  Intravenous QHS   polyethylene glycol  17 g Oral Daily   pyridOXINE  400 mg Oral Q lunch   senna-docusate  1 tablet Oral BID   verapamil  180 mg Oral Daily  Continuous Infusions:  sodium chloride     sodium chloride 50 mL/hr at 04/12/21 1100   sodium chloride Stopped (04/12/21 0845)   levETIRAcetam Stopped (04/12/21 0451)    PRN Meds:.sodium chloride, acetaminophen **OR** acetaminophen (TYLENOL) oral liquid 160 mg/5 mL **OR** acetaminophen, ALPRAZolam, bisacodyl, hydrALAZINE, senna-docusate, tiZANidine   Time Spent in minutes  35   See all Orders from today for further details   Oren Binet M.D on 04/12/2021 at 12:15 PM  To page go to [email or web address] - use universal password  [facility] -  Office  [phone]    Objective:   Vitals:   04/12/21 0900 04/12/21 1000 04/12/21 1100 04/12/21 1200  BP: 96/81 119/72 128/72   Pulse: (!) 110 97 94   Resp: (!) 21 (!) 23 16   Temp:    99.3 F (37.4 C)  TempSrc:    Oral  SpO2: 98% 98% 98%   Weight:      Height:        Wt Readings from Last 3 Encounters:  04/11/21 58.4 kg  04/02/21 62.1 kg  12/12/20 62.1 kg     Intake/Output Summary (Last 24 hours) at 04/12/2021 1215 Last data filed at 04/12/2021 1100 Gross per 24 hour  Intake 1139.44 ml  Output 1750 ml  Net -610.56 ml      Physical Exam Gen Exam:Alert awake-not in any distress HEENT:atraumatic, normocephalic Chest: B/L clear to auscultation anteriorly CVS:S1S2 regular Abdomen:soft non tender, non distended Extremities:no edema Neurology: Non focal Skin: no rash    Data Review:    CBC Recent Labs  Lab 04/08/21 1928 04/09/21 0447 04/10/21 0307 04/11/21 0300 04/12/21 0421  WBC 10.1 8.4 9.7 7.8 7.1  HGB 12.6 11.8* 11.8* 11.2* 10.2*  HCT 38.4 36.1 35.0* 33.1* 29.8*  PLT 313 270 294 240 206  MCV 95.5 96.8 95.4 94.0 94.0  MCH 31.3 31.6 32.2 31.8 32.2  MCHC 32.8 32.7 33.7 33.8 34.2  RDW 13.5 13.5 13.6 13.5 13.4  LYMPHSABS 1.2 1.7 1.6 2.1 2.6   MONOABS 0.6 0.6 0.6 0.6 0.7  EOSABS 0.0 0.0 0.0 0.0 0.0  BASOSABS 0.0 0.0 0.0 0.0 0.0     Chemistries  Recent Labs  Lab 04/08/21 1928 04/09/21 0447 04/10/21 0307 04/11/21 0300 04/12/21 0421  NA 138 136 133* 134* 134*  K 4.2 4.0 3.4* 3.3* 3.9  CL 102 104 103 102 102  CO2 22 23 19* 21* 24  GLUCOSE 150* 143* 129* 112* 87  BUN 13 12 13 11 9   CREATININE 0.94 0.86 0.75 0.80 0.80  CALCIUM 9.4 8.9 8.9 8.9 9.0  MG  --  1.8 1.8 1.8 1.9  AST 25 21  --   --  34  ALT 16 14  --   --  42  ALKPHOS 42 41  --   --  33*  BILITOT 0.8 0.5  --   --  0.3    ------------------------------------------------------------------------------------------------------------------ Recent Labs    04/12/21 0421  CHOL 207*  HDL 70  LDLCALC 121*  TRIG 80  CHOLHDL 3.0    Lab Results  Component Value Date   HGBA1C 5.2 04/12/2021   ------------------------------------------------------------------------------------------------------------------ No results for input(s): TSH, T4TOTAL, T3FREE, THYROIDAB in the last 72 hours.  Invalid input(s): FREET3  ------------------------------------------------------------------------------------------------------------------ No results for input(s): VITAMINB12, FOLATE, FERRITIN, TIBC, IRON, RETICCTPCT in the last 72 hours.   Coagulation profile Recent Labs  Lab 04/08/21 1928  INR 1.1     No results for input(s): DDIMER in the last 72 hours.  Cardiac Enzymes No results for input(s): CKMB, TROPONINI, MYOGLOBIN in the last 168 hours.  Invalid input(s): CK ------------------------------------------------------------------------------------------------------------------ No results found for: BNP  Micro Results Recent Results (from the past 240 hour(s))  Resp Panel by RT-PCR (Flu A&B, Covid) Nasopharyngeal Swab     Status: Abnormal   Collection Time: 04/02/21  3:20 PM   Specimen: Nasopharyngeal Swab; Nasopharyngeal(NP) swabs in vial transport medium   Result Value Ref Range Status   SARS Coronavirus 2 by RT PCR POSITIVE (A) NEGATIVE Final    Comment: RESULT CALLED TO, READ BACK BY AND VERIFIED WITHHarvel Quale RN 1720 04/02/21 A BROWNING (NOTE) SARS-CoV-2 target nucleic acids are DETECTED.  The SARS-CoV-2 RNA is generally detectable in upper respiratory specimens during the acute phase of infection. Positive results are indicative of the presence of the identified virus, but do not rule out bacterial infection or co-infection with other pathogens not detected by the test. Clinical correlation with patient history and other diagnostic information is necessary to determine patient infection status. The expected result is Negative.  Fact Sheet for Patients: EntrepreneurPulse.com.au  Fact Sheet for Healthcare Providers: IncredibleEmployment.be  This test is not yet approved or cleared by the [place] FDA and  has been authorized for detection and/or diagnosis of SARS-CoV-2 by FDA under an Emergency Use Authorization (EUA).  This EUA will remain in effect (meaning this test can  be used) for the duration of  the COVID-19 declaration under Section 564(b)(1) of the Act, 21 U.S.C. section 360bbb-3(b)(1), unless the authorization is terminated or revoked sooner.     Influenza A by PCR NEGATIVE NEGATIVE Final   Influenza B by PCR NEGATIVE NEGATIVE Final    Comment: (NOTE) The Xpert Xpress SARS-CoV-2/FLU/RSV plus assay is intended as an aid in the diagnosis of influenza from Nasopharyngeal swab specimens and should not be used as a sole basis for treatment. Nasal washings and aspirates are unacceptable for Xpert Xpress SARS-CoV-2/FLU/RSV testing.  Fact Sheet for Patients: EntrepreneurPulse.com.au  Fact Sheet for Healthcare Providers: IncredibleEmployment.be  This test is not yet approved or cleared by the [place] FDA and has been authorized for  detection and/or diagnosis of SARS-CoV-2 by FDA under an Emergency Use Authorization (EUA). This EUA will remain in effect (meaning this test can be used) for the duration of the COVID-19 declaration under Section 564(b)(1) of the Act, 21 U.S.C. section 360bbb-3(b)(1), unless the authorization is terminated or revoked.  Performed at [facility], [place] [address], [place], [place] [address]   Urine culture     Status: Abnormal   Collection Time: 04/09/21  2:00 AM   Specimen: In/Out Cath Urine  Result Value Ref Range Status   Specimen Description IN/OUT CATH URINE  Final   Special Requests   Final    NONE Performed at [facility], [place] [address], [place], [place] [address]    Culture (A)  Final    >=100,000 COLONIES/mL ESCHERICHIA COLI >=100,000 COLONIES/mL KLEBSIELLA PNEUMONIAE Confirmed Extended Spectrum Beta-Lactamase Producer (ESBL).  In bloodstream infections from ESBL organisms, carbapenems are preferred over piperacillin/tazobactam. They are shown to have a lower risk of mortality.    Report Status 04/12/2021 FINAL  Final   Organism ID, Bacteria ESCHERICHIA COLI (A)  Final   Organism ID, Bacteria KLEBSIELLA PNEUMONIAE (A)  Final      Susceptibility   Escherichia coli - MIC*    AMPICILLIN 8 SENSITIVE Sensitive     CEFAZOLIN <=4 SENSITIVE Sensitive     CEFEPIME <=  0.12 SENSITIVE Sensitive     CEFTRIAXONE <=0.25 SENSITIVE Sensitive     CIPROFLOXACIN <=0.25 SENSITIVE Sensitive     GENTAMICIN <=1 SENSITIVE Sensitive     IMIPENEM <=0.25 SENSITIVE Sensitive     NITROFURANTOIN 32 SENSITIVE Sensitive     TRIMETH/SULFA <=20 SENSITIVE Sensitive     AMPICILLIN/SULBACTAM 4 SENSITIVE Sensitive     PIP/TAZO <=4 SENSITIVE Sensitive     * >=100,000 COLONIES/mL ESCHERICHIA COLI   Klebsiella pneumoniae - MIC*    AMPICILLIN >=32 RESISTANT Resistant     CEFAZOLIN >=64 RESISTANT Resistant     CEFEPIME >=32 RESISTANT Resistant     CEFTRIAXONE >=64 RESISTANT Resistant      CIPROFLOXACIN 0.5 SENSITIVE Sensitive     GENTAMICIN <=1 SENSITIVE Sensitive     IMIPENEM 1 SENSITIVE Sensitive     NITROFURANTOIN 64 INTERMEDIATE Intermediate     TRIMETH/SULFA >=320 RESISTANT Resistant     AMPICILLIN/SULBACTAM >=32 RESISTANT Resistant     PIP/TAZO <=4 SENSITIVE Sensitive     * >=100,000 COLONIES/mL KLEBSIELLA PNEUMONIAE  MRSA Next Gen by PCR, Nasal     Status: None   Collection Time: 04/11/21  4:03 PM   Specimen: Nasal Mucosa; Nasal Swab  Result Value Ref Range Status   MRSA by PCR Next Gen NOT DETECTED NOT DETECTED Final    Comment: (NOTE) The GeneXpert MRSA Assay (FDA approved for NASAL specimens only), is one component of a comprehensive MRSA colonization surveillance program. It is not intended to diagnose MRSA infection nor to guide or monitor treatment for MRSA infections. Test performance is not FDA approved in patients less than 19 years old. Performed at [facility], [place] [address] [place], [place] [address]     Radiology Reports CT HEAD WO CONTRAST  Result Date: 04/11/2021 CLINICAL DATA:  Altered mental status EXAM: CT HEAD WITHOUT CONTRAST TECHNIQUE: Contiguous axial images were obtained from the base of the skull through the vertex without intravenous contrast. COMPARISON:  CT from earlier in the same day. FINDINGS: Brain: No evidence of acute infarction, hemorrhage, hydrocephalus, extra-axial collection or mass lesion/mass effect. Chronic atrophic and ischemic changes are again identified similar to that seen on prior exam. Calcified right parietal meningioma is again seen and stable. Residual contrast is noted from prior CTA of the head Vascular: No hyperdense vessel or unexpected calcification. Skull: Normal. Negative for fracture or focal lesion. Sinuses/Orbits: No acute finding. Other: None. IMPRESSION: Chronic atrophic and ischemic changes without acute abnormality. Electronically Signed   By: Inez Catalina M.D.   On: 04/11/2021 17:58   CT Head  Wo Contrast  Result Date: 04/08/2021 CLINICAL DATA:  Mental status change EXAM: CT HEAD WITHOUT CONTRAST TECHNIQUE: Contiguous axial images were obtained from the base of the skull through the vertex without intravenous contrast. COMPARISON:  CT brain 06/02/2020 FINDINGS: Brain: No acute territorial infarction, hemorrhage or new intracranial mass. Right parietal convexity calcified meningioma. Mild atrophy. Mild to moderate chronic small vessel ischemic change of the white matter. Stable ventricle size. Vascular: No hyperdense vessels.  Carotid vascular calcification Skull: Normal. Negative for fracture or focal lesion. Sinuses/Orbits: Patchy mucosal disease in the ethmoid sinus Other: None IMPRESSION: 1. No CT evidence for acute intracranial abnormality. 2. Atrophy and chronic small vessel ischemic change of the white matter Electronically Signed   By: Donavan Foil M.D.   On: 04/08/2021 19:05   MR BRAIN WO CONTRAST  Result Date: 04/09/2021 CLINICAL DATA:  Initial evaluation for acute mental status change. EXAM: MRI HEAD  WITHOUT CONTRAST TECHNIQUE: Multiplanar, multiecho pulse sequences of the brain and surrounding structures were obtained without intravenous contrast. COMPARISON:  Prior head CT from 04/08/2021. FINDINGS: Brain: Cerebral volume within normal limits for age. Patchy T2/FLAIR hyperintensity within the periventricular and deep white matter both cerebral hemispheres most consistent with chronic small vessel ischemic disease, mild for age. No abnormal foci of restricted diffusion to suggest acute or subacute ischemia. Gray-white matter differentiation maintained. No encephalomalacia to suggest chronic cortical infarction. No evidence for acute or chronic intracranial hemorrhage. 1 cm calcified meningioma overlying the right parietal convexity again noted, relatively stable from previous. No associated mass effect. No other mass lesion, midline shift or mass effect. No hydrocephalus or extra-axial  fluid collection. Pituitary gland suprasellar region within normal limits. Midline structures intact. Vascular: Major intracranial vascular flow voids are maintained. Skull and upper cervical spine: Craniocervical junction within normal limits. Bone marrow signal intensity normal. No scalp soft tissue abnormality. Sinuses/Orbits: Patient status post bilateral ocular lens replacement. Globes and orbital soft tissues demonstrate no acute finding. Scattered mucosal thickening noted within the ethmoidal air cells. Paranasal sinuses are otherwise largely clear. No mastoid effusion. Inner ear structures grossly normal. Other: None. IMPRESSION: 1. No acute intracranial abnormality. 2. Mild age-related cerebral atrophy with chronic microvascular ischemic disease. 3. 1 cm calcified meningioma overlying the right parietal convexity without associated mass effect, stable. Electronically Signed   By: Jeannine Boga M.D.   On: 04/09/2021 01:38   CT ABDOMEN PELVIS W CONTRAST  Result Date: 04/09/2021 CLINICAL DATA:  Abdominal pain, acute, nonlocalized, concern for UTI EXAM: CT ABDOMEN AND PELVIS WITH CONTRAST TECHNIQUE: Multidetector CT imaging of the abdomen and pelvis was performed using the standard protocol following bolus administration of intravenous contrast. CONTRAST:  45mL OMNIPAQUE IOHEXOL 300 MG/ML  SOLN COMPARISON:  CT lumbar spine April 04, 2020. FINDINGS: Lower chest: Bibasilar atelectasis. Coronary artery calcifications. Normal size heart. No significant pericardial effusion/thickening. Moderate hiatal hernia. Hepatobiliary: No suspicious hepatic lesion. Subcentimeter hypodense lesion in the posterior right hepatic lobe on image 20/3 too small to accurately characterize but favored represent cysts. Gallbladder is unremarkable. No biliary ductal dilation. Pancreas: Within normal limits. Spleen: Normal size spleen. 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 Adrenals/Urinary Tract: Mild bilateral adrenal thickening, favor hyperplasia. No hydronephrosis. Although evaluation is slightly limited by respiratory motion there appear to be 2 focal areas of relative hypoenhancement of the right renal parenchyma for involving the lower pole on image 56/6 and upper pole on image 60/6. There is some right-sided urothelial hyperenhancement and adjacent stranding involving the renal pelvis. No solid enhancing renal lesions. Mild thickening of the urinary bladder Stomach/Bowel: Moderate-sized hiatal hernia otherwise the stomach is grossly unremarkable. No pathologic dilation of small bowel. The appendix and terminal ileum are grossly unremarkable. Sigmoid colonic diverticulosis without findings of acute diverticulitis. Rectum is distended with a moderate volume of stool with rectal wall thickening and adjacent inflammatory stranding Vascular/Lymphatic: Aortic atherosclerosis without aneurysmal dilation. Prominent periportal lymph nodes measuring up to 6 mm. No pathologically enlarged abdominal or pelvic lymph nodes. Reproductive: Uterus and bilateral adnexa are unremarkable. Other: No abdominopelvic ascites. Musculoskeletal: Diffuse demineralization of bone. Increased height loss involving the L1 vertebral body compression deformity now with near complete vertebral plana. T9, L3 and L4 vertebral body hemangiomas. Diffuse demineralization of bone. Multilevel degenerative changes spine. Partially visualized left femoral intramedullary fixation  rod. IMPRESSION: 1. Mild thickening of the urinary bladder wall which may represent cystitis. Additionally although evaluation is slightly limited by respiratory motion there 2 apparent areas of relative hypoenhancement of the right renal parenchyma with some urothelial hyperenhancement and adjacent inflammatory stranding of the renal pelvis, findings  which may reflect pyelonephritis. 2. Rectum is distended with a moderate volume of stool and rectal wall thickening with adjacent inflammatory stranding, suggestive of stercoral colitis. 3. Increased height loss involving the chronic L1 vertebral body compression deformity now with near complete vertebral plana. 4. Moderate-sized hiatal hernia. 5. Sigmoid colonic diverticulosis without findings of acute diverticulitis. 6. Aortic atherosclerosis. Aortic Atherosclerosis (ICD10-I70.0). Electronically Signed   By: Dahlia Bailiff MD   On: 04/09/2021 09:16   CT CEREBRAL PERFUSION W CONTRAST  Result Date: 04/11/2021 CLINICAL DATA:  Code stroke follow-up EXAM: CT ANGIOGRAPHY HEAD AND NECK CT PERFUSION BRAIN TECHNIQUE: Multidetector CT imaging of the head and neck was performed using the standard protocol during bolus administration of intravenous contrast. Multiplanar CT image reconstructions and MIPs were obtained to evaluate the vascular anatomy. Carotid stenosis measurements (when applicable) are obtained utilizing NASCET criteria, using the distal internal carotid diameter as the denominator. Multiphase CT imaging of the brain was performed following IV bolus contrast injection. Subsequent parametric perfusion maps were calculated using RAPID software. CONTRAST:  156mL OMNIPAQUE IOHEXOL 350 MG/ML SOLN COMPARISON:  None. FINDINGS: CTA NECK Aortic arch: Calcified and noncalcified plaque along the arch and patent great vessel origins. No high-grade stenosis of the proximal subclavian arteries. Right carotid system: Patent. Noncalcified plaque along the proximal internal carotid causing less than 50% stenosis. Left carotid system: Patent. Mild mixed plaque along the proximal internal carotid causing minimal stenosis. Vertebral arteries: Patent. Right vertebral artery slightly dominant. No stenosis or evidence of dissection. Skeleton: Degenerative changes of the cervical spine. Other neck: Unremarkable. Upper chest: No  apical lung mass. Review of the MIP images confirms the above findings CTA HEAD Anterior circulation: Intracranial internal carotid arteries are patent with minor calcified plaque. Anterior and middle cerebral arteries are patent. Posterior circulation: Intracranial vertebral arteries are patent. Basilar artery is patent. Major cerebellar artery origins are patent. Posterior communicating arteries are present. Posterior cerebral arteries are patent. Venous sinuses: Patent as allowed by contrast bolus timing. Review of the MIP images confirms the above findings CT Brain Perfusion Findings: ASPECTS: 10 CBF (<30%) Volume: 5mL Perfusion (Tmax>6.0s) volume: 86mL Mismatch Volume: 51mL Infarction Location: None. IMPRESSION: No large vessel occlusion, hemodynamically significant stenosis, or evidence of dissection. Perfusion imaging demonstrates no evidence of core infarction or penumbra. These results were communicated to Dr. Leonie Man at 3:26 pm on 04/11/2021 by text page via the [employer] [employer] messaging system. Electronically Signed   By: Macy Mis M.D.   On: 04/11/2021 15:36   DG Chest Port 1 View  Result Date: 04/08/2021 CLINICAL DATA:  Pt arrives via [facility] from [facility] c/o AMS and possible UTI. Pt has no hx of memory issues EXAM: PORTABLE CHEST 1 VIEW COMPARISON:  04/02/2021. FINDINGS: Cardiac silhouette partly obscured but grossly normal in size. No mediastinal or hilar masses. Bilateral prominent interstitial markings. Additional opacity at the left lung base is noted consistent with atelectasis with a possible small effusion. Lungs otherwise clear. No convincing right pleural effusion and no pneumothorax. Skeletal structures are grossly intact. IMPRESSION: No acute cardiopulmonary disease. Electronically Signed   By: Lajean Manes M.D.   On: 04/08/2021 18:19   DG Chest Portable 1 View  Result Date: 04/02/2021  CLINICAL DATA:  Hypotension and dizziness EXAM: PORTABLE CHEST 1 VIEW COMPARISON:   June 02, 2020 FINDINGS: There is slight left base atelectasis. Lungs elsewhere are clear. Heart size and pulmonary vascularity are normal. No adenopathy. There is aortic atherosclerosis. No bone lesions. IMPRESSION: Left base atelectasis. No edema or airspace opacity. Heart size normal. Aortic Atherosclerosis (ICD10-I70.0). Electronically Signed   By: Lowella Grip III M.D.   On: 04/02/2021 15:58   DG Chest Port 1V same Day  Result Date: 04/12/2021 CLINICAL DATA:  Shortness of breath. EXAM: PORTABLE CHEST 1 VIEW COMPARISON:  04/08/2021 FINDINGS: Single view of the chest was obtained. Mild blunting at the left costophrenic angle but minimal change. Otherwise, the lungs are clear. Heart and mediastinum are within normal limits. Evidence for leads overlying the left upper chest. Negative for pneumothorax. IMPRESSION: 1. No acute chest abnormality. 2. Mild blunting at the left costophrenic angle. Findings could be related to scarring or minimal pleural fluid. Electronically Signed   By: Markus Daft M.D.   On: 04/12/2021 11:00   EEG adult  Result Date: 04/11/2021 Lora Havens, MD     04/11/2021  5:21 PM Patient Name: Tracey Armstrong MRN: [id number] Epilepsy Attending: Lora Havens Referring Physician/Provider: Anibal Henderson, NP Date: 04/11/2021 Duration: 20.25 mins Patient history: 86yo F with sudden worsening of mental status. EEG to evaluate for seizure Level of alertness: Awake AEDs during EEG study: None Technical aspects: This EEG study was done with scalp electrodes positioned according to the 10-20 International system of electrode placement. Electrical activity was acquired at a sampling rate of 500Hz  and reviewed with a high frequency filter of 70Hz  and a low frequency filter of 1Hz . EEG data were recorded continuously and digitally stored. Description: No posterior dominant rhythm was seen. EEG initially showed generalized, maximal bifrontal periodic epileptiform  discharges at 3hz  consistent  with convulsive status epilepticus. On video, no clnical signs were noted. EEG spontaneously improved after 1645. EEG then showed continuous generalized 3 to 6 Hz theta-delta slowing. Iv ativan 2mg  was administered at 1648 after which eeg showed generalized and lateralized right hemisphere 3-6hz  theta-delta slowing admixed with 15-18hz  generalized beta activity. Hyperventilation and photic stimulation were not performed.   ABNORMALITY - Non convulsive status epilepticus, generalized - Continuous slow, generalized - Excessive beta, generalized IMPRESSION: This study was initially suggestive of non convulsive status epilepticus which improved after 1645. EEG was then suggestive of cortical dysfunction arising from right hemisphere, likely secondary to underlying structural abnormality, post-ictal state. Additionally there is moderate diffuse encephalopathy, nonspecific etiology but likely related to sedation, seizure. Dr Lorrin Goodell was notified. Priyanka Barbra Sarks   Overnight EEG with video  Result Date: 04/12/2021 Lora Havens, MD     04/12/2021 11:14 AM Patient Name: Tracey Armstrong MRN: [id number] Epilepsy Attending: Lora Havens Referring Physician/Provider: Dr Antony Contras Duration: 04/11/2021 1721 to 04/12/2021 1100  Patient history: 86yo F with sudden worsening of mental status. EEG to evaluate for seizure  Level of alertness: Awake, asleep  AEDs during EEG study: LEV, GBP  Technical aspects: This EEG study was done with scalp electrodes positioned according to the 10-20 International system of electrode placement. Electrical activity was acquired at a sampling rate of 500Hz  and reviewed with a high frequency filter of 70Hz  and a low frequency filter of 1Hz . EEG data were recorded continuously and digitally stored.  Description: No posterior dominant rhythm was seen.  Sleep was characterized by sleep spindles (12 to 14 Hz), maximal frontocentral region.  EEG showed intermittent generalized and lateralized  right hemisphere 3-6hz  theta-delta slowing. Hyperventilation and photic stimulation were not performed.    ABNORMALITY -Intermittent slow, generalized and lateralized right hemisphere  IMPRESSION: This study is suggestive of non specific cortical dysfunction arising from right hemisphere. Additionally there is mild to moderate diffuse encephalopathy, nonspecific etiology but likely related to sedation, seizure. EEG appears to be improving compared to previous day.  Lora Havens   CT HEAD CODE STROKE WO CONTRAST  Result Date: 04/11/2021 CLINICAL DATA:  Code stroke. EXAM: CT HEAD WITHOUT CONTRAST TECHNIQUE: Contiguous axial images were obtained from the base of the skull through the vertex without intravenous contrast. COMPARISON:  04/08/2021 FINDINGS: Brain: No acute intracranial hemorrhage, mass effect, or edema. No new loss of gray-white differentiation. Patchy hypoattenuation in the supratentorial white matter likely reflects stable chronic microvascular ischemic changes. There is a stable small calcified meningioma along the right parietal convexity. Ventricles and sulci are stable in size and configuration. No extra-axial collection. Vascular: No hyperdense vessel. Skull: Unremarkable. Sinuses/Orbits: No acute finding. Other: Mastoid air cells are clear. ASPECTS ([place] Stroke Program Early CT Score) - Ganglionic level infarction (caudate, lentiform nuclei, internal capsule, insula, M1-M3 cortex): 7 - Supraganglionic infarction (M4-M6 cortex): 3 Total score (0-10 with 10 being normal): 10 IMPRESSION: There is no acute intracranial hemorrhage or evidence of acute infarction. ASPECT score is 10. No significant change since recent prior study. These results were communicated to Dr. Leonie Man at 3:22 pm on 04/11/2021 by text page via the [facility] messaging system. Electronically Signed   By: Macy Mis M.D.   On: 04/11/2021 15:26   CT ANGIO HEAD CODE STROKE  Result Date: 04/11/2021 CLINICAL DATA:  Code  stroke follow-up EXAM: CT ANGIOGRAPHY HEAD AND NECK CT PERFUSION BRAIN TECHNIQUE: Multidetector CT imaging of the head and neck was performed using the standard protocol during bolus administration of intravenous contrast. Multiplanar CT image reconstructions and MIPs were obtained to evaluate the vascular anatomy. Carotid stenosis measurements (when applicable) are obtained utilizing NASCET criteria, using the distal internal carotid diameter as the denominator. Multiphase CT imaging of the brain was performed following IV bolus contrast injection. Subsequent parametric perfusion maps were calculated using RAPID software. CONTRAST:  173mL OMNIPAQUE IOHEXOL 350 MG/ML SOLN COMPARISON:  None. FINDINGS: CTA NECK Aortic arch: Calcified and noncalcified plaque along the arch and patent great vessel origins. No high-grade stenosis of the proximal subclavian arteries. Right carotid system: Patent. Noncalcified plaque along the proximal internal carotid causing less than 50% stenosis. Left carotid system: Patent. Mild mixed plaque along the proximal internal carotid causing minimal stenosis. Vertebral arteries: Patent. Right vertebral artery slightly dominant. No stenosis or evidence of dissection. Skeleton: Degenerative changes of the cervical spine. Other neck: Unremarkable. Upper chest: No apical lung mass. Review of the MIP images confirms the above findings CTA HEAD Anterior circulation: Intracranial internal carotid arteries are patent with minor calcified plaque. Anterior and middle cerebral arteries are patent. Posterior circulation: Intracranial vertebral arteries are patent. Basilar artery is patent. Major cerebellar artery origins are patent. Posterior communicating arteries are present. Posterior cerebral arteries are patent. Venous sinuses: Patent as allowed by contrast bolus timing. Review of the MIP images confirms the above findings CT Brain Perfusion Findings: ASPECTS: 10 CBF (<30%) Volume: 34mL Perfusion  (Tmax>6.0s) volume: 60mL Mismatch Volume: 50mL Infarction Location: None. IMPRESSION: No large vessel occlusion, hemodynamically significant stenosis, or evidence of dissection. Perfusion imaging demonstrates no evidence of core infarction or penumbra. These results were communicated to  Dr. Leonie Man at 3:26 pm on 04/11/2021 by text page via the [facility] messaging system. Electronically Signed   By: Macy Mis M.D.   On: 04/11/2021 15:36   CT ANGIO NECK CODE STROKE  Result Date: 04/11/2021 CLINICAL DATA:  Code stroke follow-up EXAM: CT ANGIOGRAPHY HEAD AND NECK CT PERFUSION BRAIN TECHNIQUE: Multidetector CT imaging of the head and neck was performed using the standard protocol during bolus administration of intravenous contrast. Multiplanar CT image reconstructions and MIPs were obtained to evaluate the vascular anatomy. Carotid stenosis measurements (when applicable) are obtained utilizing NASCET criteria, using the distal internal carotid diameter as the denominator. Multiphase CT imaging of the brain was performed following IV bolus contrast injection. Subsequent parametric perfusion maps were calculated using RAPID software. CONTRAST:  140mL OMNIPAQUE IOHEXOL 350 MG/ML SOLN COMPARISON:  None. FINDINGS: CTA NECK Aortic arch: Calcified and noncalcified plaque along the arch and patent great vessel origins. No high-grade stenosis of the proximal subclavian arteries. Right carotid system: Patent. Noncalcified plaque along the proximal internal carotid causing less than 50% stenosis. Left carotid system: Patent. Mild mixed plaque along the proximal internal carotid causing minimal stenosis. Vertebral arteries: Patent. Right vertebral artery slightly dominant. No stenosis or evidence of dissection. Skeleton: Degenerative changes of the cervical spine. Other neck: Unremarkable. Upper chest: No apical lung mass. Review of the MIP images confirms the above findings CTA HEAD Anterior circulation: Intracranial internal  carotid arteries are patent with minor calcified plaque. Anterior and middle cerebral arteries are patent. Posterior circulation: Intracranial vertebral arteries are patent. Basilar artery is patent. Major cerebellar artery origins are patent. Posterior communicating arteries are present. Posterior cerebral arteries are patent. Venous sinuses: Patent as allowed by contrast bolus timing. Review of the MIP images confirms the above findings CT Brain Perfusion Findings: ASPECTS: 10 CBF (<30%) Volume: 48mL Perfusion (Tmax>6.0s) volume: 65mL Mismatch Volume: 75mL Infarction Location: None. IMPRESSION: No large vessel occlusion, hemodynamically significant stenosis, or evidence of dissection. Perfusion imaging demonstrates no evidence of core infarction or penumbra. These results were communicated to Dr. Leonie Man at 3:26 pm on 04/11/2021 by text page via the [facility] messaging system. Electronically Signed   By: Macy Mis M.D.   On: 04/11/2021 15:36

## 2021-04-12 NOTE — Progress Notes (Signed)
Per MRI, pt may not be able to get MRI until after shift change d/ t overabundance of MRIs.  Will attempt again.

## 2021-04-12 NOTE — Progress Notes (Signed)
PT Cancellation Note  Patient Details Name: Tracey Armstrong MRN: [id number] DOB: 30-Apr-1935   Cancelled Treatment:    Reason Eval/Treat Not Completed: Active bedrest order remains this morning. PT will continue to follow and evaluate as appropriate.   Karma Ganja, PT, DPT   Acute Rehabilitation Department Pager #: [phone]   Otho Bellows 04/12/2021, 7:50 AM

## 2021-04-12 NOTE — Progress Notes (Signed)
  Echocardiogram 2D Echocardiogram has been performed.  Tracey Armstrong 04/12/2021, 11:58 AM

## 2021-04-12 NOTE — Procedures (Addendum)
Patient Name: Tracey Armstrong  MRN: [id number]  Epilepsy Attending: Lora Havens  Referring Physician/Provider: Dr Antony Contras Duration: 04/11/2021 1721 to 04/12/2021 1354   Patient history: 86yo F with sudden worsening of mental status. EEG to evaluate for seizure   Level of alertness: Awake, asleep   AEDs during EEG study: LEV, GBP   Technical aspects: This EEG study was done with scalp electrodes positioned according to the 10-20 International system of electrode placement. Electrical activity was acquired at a sampling rate of 500Hz  and reviewed with a high frequency filter of 70Hz  and a low frequency filter of 1Hz . EEG data were recorded continuously and digitally stored.   Description: No posterior dominant rhythm was seen.  Sleep was characterized by sleep spindles (12 to 14 Hz), maximal frontocentral region.  EEG showed intermittent generalized and lateralized right hemisphere 3-6hz  theta-delta slowing. Hyperventilation and photic stimulation were not performed.      ABNORMALITY -Intermittent slow, generalized and lateralized right hemisphere   IMPRESSION: This study is suggestive of non specific cortical dysfunction arising from right hemisphere. Additionally there is mild to moderate diffuse encephalopathy, nonspecific etiology but likely related to sedation, seizure.  EEG appears to be improving compared to previous day.    Marie Borowski Barbra Sarks

## 2021-04-12 NOTE — Progress Notes (Signed)
Pharmacy Antibiotic Note  Yicel Shannon is a 86 y.o. female admitted on 04/08/2021 with confusion. Code stroke activated yesterday and patient received alteplase then later developed seizures and flush for alteplase was not completed. Prior to this patient received 3 doses of ceftriaxone for concern of UTI. Pharmacy has been consulted for Ertapenem dosing for ESBL UTI per ID. Tmax 101.3. WBC wnl.   Plan: Ertapenem 1g q24h x 7 days - stop date in place  Monitor renal function, cultures, and clinical progression  Height: 5\' 2"  (157.5 cm) Weight: 58.4 kg (128 lb 12 oz) IBW/kg (Calculated) : 50.1  Temp (24hrs), Avg:99.7 F (37.6 C), Min:98.4 F (36.9 C), Max:101.3 F (38.5 C)  Recent Labs  Lab 04/08/21 1920 04/08/21 1928 04/08/21 2244 04/09/21 0447 04/10/21 0307 04/11/21 0300 04/12/21 0421  WBC  --  10.1  --  8.4 9.7 7.8 7.1  CREATININE  --  0.94  --  0.86 0.75 0.80 0.80  LATICACIDVEN 2.2*  --  1.5  --   --   --   --     Estimated Creatinine Clearance: 41.4 mL/min (by C-G formula based on SCr of 0.8 mg/dL).    Allergies  Allergen Reactions   Dihydrotachysterol Other (See Comments)    Dihydrotachysterol is a form of vitamin D. Might be "DHT Intensol" (Name Brand)- Reaction unknown- tolerating 50,000 units of Vitamin D-2 (Drisdol) in 2022   Trazodone And Nefazodone Other (See Comments)    Worsens headaches   Valium [Diazepam] Other (See Comments)    headaches   Citalopram Other (See Comments)    Other reaction(s): Headache   Other Other (See Comments)    "States all antidepressants cause migraines." - tolerates Xanax     Antimicrobials this admission: CTX 6/21 >> 6/23 Ertapenem 6/24 >> (6/30)  Dose adjustments this admission: N/a  Microbiology results: 6/21 ucx: 100k e. Coli, 100k k. pneumoniae, ESBL 6/24 bcx:   Thank you for allowing pharmacy to be a part of this patient's care.  Cristela Felt, PharmD Clinical Pharmacist  04/12/2021 12:27 PM

## 2021-04-12 NOTE — Progress Notes (Signed)
STROKE TEAM PROGRESS NOTE   SUBJECTIVE Presented yesterday as a code stroke with sudden onset of mutism and expressive aphasia with left gaze deviation and received IV tPA and then subsequently was found to have focal seizure and stat EEG showed nonconvulsive status which resolved with IV Ativan and Keppra.  She has done well overnight alert and oriented and not had any further seizures.  Blood pressure adequately controlled.  Ceftriaxone has been discontinued and pharmacy recommends changing to IV and ertapenem for E. coli and ESBL in the urine Patient did spike temperature 101.3 last night but appears afebrile this morning.  She was placed on long-term EEG monitoring and overnight no further seizures have been noted. OBJECTIVE Vitals:   04/12/21 1100 04/12/21 1200  BP: 128/72 135/69  Pulse: 94 94  Resp: 16 (!) 26  Temp:  99.3 F (37.4 C)  SpO2: 98% 98%     Physical Exam Frail elderly Caucasian lady not in distress. . Afebrile. Head is nontraumatic. Neck is supple without bruit.    Cardiac exam no murmur or gallop. Lungs are clear to auscultation. Distal pulses are well felt.  Neurological Exam :  She is awake alert oriented to person and place only.  She has diminished attention, registration and recall.  Speech appears clear and fluent without hesitancy or aphasia.  She has good comprehension.  She follows commands well.  Extraocular movements are full range without nystagmus.  She blinks to threat bilaterally.  Face is symmetric without weakness.  Tongue midline.  Motor system exam shows symmetric upper and lower extremity strength without focal weakness.  Sensation intact bilaterally.  Coordination is slow but accurate.  Gait not tested.  Pertinent Laboratory Studies (past 3 days) / Diagnostics (past 24h) Recent Labs    04/10/21 0307 04/11/21 0300 04/12/21 0421  WBC 9.7 7.8 7.1  HGB 11.8* 11.2* 10.2*  PLT 294 240 206  NA 133* 134* 134*  K 3.4* 3.3* 3.9  CREATININE 0.75 0.80 0.80   GLUCOSE 129* 112* 87    CT HEAD WO CONTRAST  Result Date: 04/11/2021 CLINICAL DATA:  Altered mental status EXAM: CT HEAD WITHOUT CONTRAST TECHNIQUE: Contiguous axial images were obtained from the base of the skull through the vertex without intravenous contrast. COMPARISON:  CT from earlier in the same day. FINDINGS: Brain: No evidence of acute infarction, hemorrhage, hydrocephalus, extra-axial collection or mass lesion/mass effect. Chronic atrophic and ischemic changes are again identified similar to that seen on prior exam. Calcified right parietal meningioma is again seen and stable. Residual contrast is noted from prior CTA of the head Vascular: No hyperdense vessel or unexpected calcification. Skull: Normal. Negative for fracture or focal lesion. Sinuses/Orbits: No acute finding. Other: None. IMPRESSION: Chronic atrophic and ischemic changes without acute abnormality. Electronically Signed   By: Inez Catalina M.D.   On: 04/11/2021 17:58   CT CEREBRAL PERFUSION W CONTRAST  Result Date: 04/11/2021 CLINICAL DATA:  Code stroke follow-up EXAM: CT ANGIOGRAPHY HEAD AND NECK CT PERFUSION BRAIN TECHNIQUE: Multidetector CT imaging of the head and neck was performed using the standard protocol during bolus administration of intravenous contrast. Multiplanar CT image reconstructions and MIPs were obtained to evaluate the vascular anatomy. Carotid stenosis measurements (when applicable) are obtained utilizing NASCET criteria, using the distal internal carotid diameter as the denominator. Multiphase CT imaging of the brain was performed following IV bolus contrast injection. Subsequent parametric perfusion maps were calculated using RAPID software. CONTRAST:  163mL OMNIPAQUE IOHEXOL 350 MG/ML SOLN COMPARISON:  None.  FINDINGS: CTA NECK Aortic arch: Calcified and noncalcified plaque along the arch and patent great vessel origins. No high-grade stenosis of the proximal subclavian arteries. Right carotid system:  Patent. Noncalcified plaque along the proximal internal carotid causing less than 50% stenosis. Left carotid system: Patent. Mild mixed plaque along the proximal internal carotid causing minimal stenosis. Vertebral arteries: Patent. Right vertebral artery slightly dominant. No stenosis or evidence of dissection. Skeleton: Degenerative changes of the cervical spine. Other neck: Unremarkable. Upper chest: No apical lung mass. Review of the MIP images confirms the above findings CTA HEAD Anterior circulation: Intracranial internal carotid arteries are patent with minor calcified plaque. Anterior and middle cerebral arteries are patent. Posterior circulation: Intracranial vertebral arteries are patent. Basilar artery is patent. Major cerebellar artery origins are patent. Posterior communicating arteries are present. Posterior cerebral arteries are patent. Venous sinuses: Patent as allowed by contrast bolus timing. Review of the MIP images confirms the above findings CT Brain Perfusion Findings: ASPECTS: 10 CBF (<30%) Volume: 17mL Perfusion (Tmax>6.0s) volume: 81mL Mismatch Volume: 97mL Infarction Location: None. IMPRESSION: No large vessel occlusion, hemodynamically significant stenosis, or evidence of dissection. Perfusion imaging demonstrates no evidence of core infarction or penumbra. These results were communicated to Dr. Leonie Man at 3:26 pm on 04/11/2021 by text page via the [facility] messaging system. Electronically Signed   By: Macy Mis M.D.   On: 04/11/2021 15:36   DG Chest Port 1V same Day  Result Date: 04/12/2021 CLINICAL DATA:  Shortness of breath. EXAM: PORTABLE CHEST 1 VIEW COMPARISON:  04/08/2021 FINDINGS: Single view of the chest was obtained. Mild blunting at the left costophrenic angle but minimal change. Otherwise, the lungs are clear. Heart and mediastinum are within normal limits. Evidence for leads overlying the left upper chest. Negative for pneumothorax. IMPRESSION: 1. No acute chest abnormality.  2. Mild blunting at the left costophrenic angle. Findings could be related to scarring or minimal pleural fluid. Electronically Signed   By: Markus Daft M.D.   On: 04/12/2021 11:00   EEG adult  Result Date: 04/11/2021 Lora Havens, MD     04/11/2021  5:21 PM Patient Name: Tracey Armstrong MRN: [id number] Epilepsy Attending: Lora Havens Referring Physician/Provider: Anibal Henderson, NP Date: 04/11/2021 Duration: 20.25 mins Patient history: 86yo F with sudden worsening of mental status. EEG to evaluate for seizure Level of alertness: Awake AEDs during EEG study: None Technical aspects: This EEG study was done with scalp electrodes positioned according to the 10-20 International system of electrode placement. Electrical activity was acquired at a sampling rate of 500Hz  and reviewed with a high frequency filter of 70Hz  and a low frequency filter of 1Hz . EEG data were recorded continuously and digitally stored. Description: No posterior dominant rhythm was seen. EEG initially showed generalized, maximal bifrontal periodic epileptiform  discharges at 3hz  consistent with convulsive status epilepticus. On video, no clnical signs were noted. EEG spontaneously improved after 1645. EEG then showed continuous generalized 3 to 6 Hz theta-delta slowing. Iv ativan 2mg  was administered at 1648 after which eeg showed generalized and lateralized right hemisphere 3-6hz  theta-delta slowing admixed with 15-18hz  generalized beta activity. Hyperventilation and photic stimulation were not performed.   ABNORMALITY - Non convulsive status epilepticus, generalized - Continuous slow, generalized - Excessive beta, generalized IMPRESSION: This study was initially suggestive of non convulsive status epilepticus which improved after 1645. EEG was then suggestive of cortical dysfunction arising from right hemisphere, likely secondary to underlying structural abnormality, post-ictal state. Additionally there is moderate diffuse encephalopathy,  nonspecific etiology but likely related to sedation, seizure. Dr Lorrin Goodell was notified. Priyanka Barbra Sarks   Overnight EEG with video  Result Date: 04/12/2021 Lora Havens, MD     04/12/2021 11:14 AM Patient Name: Jaleiah Asay MRN: [id number] Epilepsy Attending: Lora Havens Referring Physician/Provider: Dr Antony Contras Duration: 04/11/2021 1721 to 04/12/2021 1100  Patient history: 86yo F with sudden worsening of mental status. EEG to evaluate for seizure  Level of alertness: Awake, asleep  AEDs during EEG study: LEV, GBP  Technical aspects: This EEG study was done with scalp electrodes positioned according to the 10-20 International system of electrode placement. Electrical activity was acquired at a sampling rate of 500Hz  and reviewed with a high frequency filter of 70Hz  and a low frequency filter of 1Hz . EEG data were recorded continuously and digitally stored.  Description: No posterior dominant rhythm was seen.  Sleep was characterized by sleep spindles (12 to 14 Hz), maximal frontocentral region.  EEG showed intermittent generalized and lateralized right hemisphere 3-6hz  theta-delta slowing. Hyperventilation and photic stimulation were not performed.    ABNORMALITY -Intermittent slow, generalized and lateralized right hemisphere  IMPRESSION: This study is suggestive of non specific cortical dysfunction arising from right hemisphere. Additionally there is mild to moderate diffuse encephalopathy, nonspecific etiology but likely related to sedation, seizure. EEG appears to be improving compared to previous day.  Lora Havens   CT HEAD CODE STROKE WO CONTRAST  Result Date: 04/11/2021 CLINICAL DATA:  Code stroke. EXAM: CT HEAD WITHOUT CONTRAST TECHNIQUE: Contiguous axial images were obtained from the base of the skull through the vertex without intravenous contrast. COMPARISON:  04/08/2021 FINDINGS: Brain: No acute intracranial hemorrhage, mass effect, or edema. No new loss of gray-white  differentiation. Patchy hypoattenuation in the supratentorial white matter likely reflects stable chronic microvascular ischemic changes. There is a stable small calcified meningioma along the right parietal convexity. Ventricles and sulci are stable in size and configuration. No extra-axial collection. Vascular: No hyperdense vessel. Skull: Unremarkable. Sinuses/Orbits: No acute finding. Other: Mastoid air cells are clear. ASPECTS ([place] Stroke Program Early CT Score) - Ganglionic level infarction (caudate, lentiform nuclei, internal capsule, insula, M1-M3 cortex): 7 - Supraganglionic infarction (M4-M6 cortex): 3 Total score (0-10 with 10 being normal): 10 IMPRESSION: There is no acute intracranial hemorrhage or evidence of acute infarction. ASPECT score is 10. No significant change since recent prior study. These results were communicated to Dr. Leonie Man at 3:22 pm on 04/11/2021 by text page via the [facility] messaging system. Electronically Signed   By: Macy Mis M.D.   On: 04/11/2021 15:26   CT ANGIO HEAD CODE STROKE  Result Date: 04/11/2021 CLINICAL DATA:  Code stroke follow-up EXAM: CT ANGIOGRAPHY HEAD AND NECK CT PERFUSION BRAIN TECHNIQUE: Multidetector CT imaging of the head and neck was performed using the standard protocol during bolus administration of intravenous contrast. Multiplanar CT image reconstructions and MIPs were obtained to evaluate the vascular anatomy. Carotid stenosis measurements (when applicable) are obtained utilizing NASCET criteria, using the distal internal carotid diameter as the denominator. Multiphase CT imaging of the brain was performed following IV bolus contrast injection. Subsequent parametric perfusion maps were calculated using RAPID software. CONTRAST:  158mL OMNIPAQUE IOHEXOL 350 MG/ML SOLN COMPARISON:  None. FINDINGS: CTA NECK Aortic arch: Calcified and noncalcified plaque along the arch and patent great vessel origins. No high-grade stenosis of the proximal subclavian  arteries. Right carotid system: Patent. Noncalcified plaque along the proximal internal carotid causing less than 50% stenosis. Left carotid system:  Patent. Mild mixed plaque along the proximal internal carotid causing minimal stenosis. Vertebral arteries: Patent. Right vertebral artery slightly dominant. No stenosis or evidence of dissection. Skeleton: Degenerative changes of the cervical spine. Other neck: Unremarkable. Upper chest: No apical lung mass. Review of the MIP images confirms the above findings CTA HEAD Anterior circulation: Intracranial internal carotid arteries are patent with minor calcified plaque. Anterior and middle cerebral arteries are patent. Posterior circulation: Intracranial vertebral arteries are patent. Basilar artery is patent. Major cerebellar artery origins are patent. Posterior communicating arteries are present. Posterior cerebral arteries are patent. Venous sinuses: Patent as allowed by contrast bolus timing. Review of the MIP images confirms the above findings CT Brain Perfusion Findings: ASPECTS: 10 CBF (<30%) Volume: 47mL Perfusion (Tmax>6.0s) volume: 25mL Mismatch Volume: 4mL Infarction Location: None. IMPRESSION: No large vessel occlusion, hemodynamically significant stenosis, or evidence of dissection. Perfusion imaging demonstrates no evidence of core infarction or penumbra. These results were communicated to Dr. Leonie Man at 3:26 pm on 04/11/2021 by text page via the [facility] messaging system. Electronically Signed   By: Macy Mis M.D.   On: 04/11/2021 15:36   CT ANGIO NECK CODE STROKE  Result Date: 04/11/2021 CLINICAL DATA:  Code stroke follow-up EXAM: CT ANGIOGRAPHY HEAD AND NECK CT PERFUSION BRAIN TECHNIQUE: Multidetector CT imaging of the head and neck was performed using the standard protocol during bolus administration of intravenous contrast. Multiplanar CT image reconstructions and MIPs were obtained to evaluate the vascular anatomy. Carotid stenosis measurements (when  applicable) are obtained utilizing NASCET criteria, using the distal internal carotid diameter as the denominator. Multiphase CT imaging of the brain was performed following IV bolus contrast injection. Subsequent parametric perfusion maps were calculated using RAPID software. CONTRAST:  178mL OMNIPAQUE IOHEXOL 350 MG/ML SOLN COMPARISON:  None. FINDINGS: CTA NECK Aortic arch: Calcified and noncalcified plaque along the arch and patent great vessel origins. No high-grade stenosis of the proximal subclavian arteries. Right carotid system: Patent. Noncalcified plaque along the proximal internal carotid causing less than 50% stenosis. Left carotid system: Patent. Mild mixed plaque along the proximal internal carotid causing minimal stenosis. Vertebral arteries: Patent. Right vertebral artery slightly dominant. No stenosis or evidence of dissection. Skeleton: Degenerative changes of the cervical spine. Other neck: Unremarkable. Upper chest: No apical lung mass. Review of the MIP images confirms the above findings CTA HEAD Anterior circulation: Intracranial internal carotid arteries are patent with minor calcified plaque. Anterior and middle cerebral arteries are patent. Posterior circulation: Intracranial vertebral arteries are patent. Basilar artery is patent. Major cerebellar artery origins are patent. Posterior communicating arteries are present. Posterior cerebral arteries are patent. Venous sinuses: Patent as allowed by contrast bolus timing. Review of the MIP images confirms the above findings CT Brain Perfusion Findings: ASPECTS: 10 CBF (<30%) Volume: 49mL Perfusion (Tmax>6.0s) volume: 9mL Mismatch Volume: 9mL Infarction Location: None. IMPRESSION: No large vessel occlusion, hemodynamically significant stenosis, or evidence of dissection. Perfusion imaging demonstrates no evidence of core infarction or penumbra. These results were communicated to Dr. Leonie Man at 3:26 pm on 04/11/2021 by text page via the [facility]  messaging system. Electronically Signed   By: Macy Mis M.D.   On: 04/11/2021 15:36     ASSESSMENT [name] is a 86 y.o. female with with sudden onset of mental status change with aphasia left gaze deviation thought to be left MCA branch infarct treated with IV tPA but subsequently found to have focal seizures and nonconvulsive status which is responded well to IV Ativan and Keppra.  She has an ongoing urinary tract infection with E. coli and ESBL was on ceftriaxone which has since now been .  Discontinued.  She spiked fever overnight but has been started now on ertapenem   IMPRESSION Strokelike episode likely due to unwitnessed seizure followed by postictal state and nonconvulsive status epilepticus.  Underlying etiology likely encephalopathy from urinary tract infection and possibly antibiotic effect.   RECOMMENDATIONS Continue close neurological monitoring and strict blood pressure control as per post tPA protocol.  Discontinue long-term EEG monitoring.  Continue Keppra 500 twice daily for seizure.  Check MRI scan of the brain later today.  Continue treatment for UTI as per primary team.  Mobilize out of bed.  Therapy consults.  Patient will be transferred back to medical hospitalist Dr.Ghimire`s care tomorrow. Long discussion with patient's son at the bedside and answered questions about her condition. This patient is critically ill and at significant risk of neurological worsening, death and care requires constant monitoring of vital signs, hemodynamics,respiratory and cardiac monitoring, extensive review of multiple databases, frequent neurological assessment, discussion with family, other specialists and medical decision making of high complexity.I have made any additions or clarifications directly to the above note.This critical care time does not reflect procedure time, or teaching time or supervisory time of PA/NP/Med Resident etc but could involve care discussion time.  I  spent 35 minutes of neurocritical care time  in the care of  this patient.     Hospital day # [place], [place] for Schedule & Pager information 04/12/2021 12:56 PM    To contact Stroke Continuity provider, please refer to [email or web address] After hours, contact General Neurology

## 2021-04-13 DIAGNOSIS — N390 Urinary tract infection, site not specified: Secondary | ICD-10-CM

## 2021-04-13 DIAGNOSIS — B9689 Other specified bacterial agents as the cause of diseases classified elsewhere: Secondary | ICD-10-CM

## 2021-04-13 DIAGNOSIS — R569 Unspecified convulsions: Secondary | ICD-10-CM

## 2021-04-13 LAB — CBC WITH DIFFERENTIAL/PLATELET
Abs Immature Granulocytes: 0.02 10*3/uL (ref 0.00–0.07)
Basophils Absolute: 0 10*3/uL (ref 0.0–0.1)
Basophils Relative: 1 %
Eosinophils Absolute: 0.1 10*3/uL (ref 0.0–0.5)
Eosinophils Relative: 2 %
HCT: 29.9 % — ABNORMAL LOW (ref 36.0–46.0)
Hemoglobin: 10.3 g/dL — ABNORMAL LOW (ref 12.0–15.0)
Immature Granulocytes: 0 %
Lymphocytes Relative: 45 %
Lymphs Abs: 2.6 10*3/uL (ref 0.7–4.0)
MCH: 32.7 pg (ref 26.0–34.0)
MCHC: 34.4 g/dL (ref 30.0–36.0)
MCV: 94.9 fL (ref 80.0–100.0)
Monocytes Absolute: 0.5 10*3/uL (ref 0.1–1.0)
Monocytes Relative: 9 %
Neutro Abs: 2.5 10*3/uL (ref 1.7–7.7)
Neutrophils Relative %: 43 %
Platelets: 188 10*3/uL (ref 150–400)
RBC: 3.15 MIL/uL — ABNORMAL LOW (ref 3.87–5.11)
RDW: 13.3 % (ref 11.5–15.5)
WBC: 5.7 10*3/uL (ref 4.0–10.5)
nRBC: 0 % (ref 0.0–0.2)

## 2021-04-13 LAB — BASIC METABOLIC PANEL
Anion gap: 6 (ref 5–15)
BUN: 8 mg/dL (ref 8–23)
CO2: 24 mmol/L (ref 22–32)
Calcium: 8.6 mg/dL — ABNORMAL LOW (ref 8.9–10.3)
Chloride: 103 mmol/L (ref 98–111)
Creatinine, Ser: 0.83 mg/dL (ref 0.44–1.00)
GFR, Estimated: >60 mL/min (ref >60)
Glucose, Bld: 92 mg/dL (ref 70–99)
Potassium: 3.8 mmol/L (ref 3.5–5.1)
Sodium: 133 mmol/L — ABNORMAL LOW (ref 135–145)

## 2021-04-13 LAB — MAGNESIUM: Magnesium: 1.9 mg/dL (ref 1.7–2.4)

## 2021-04-13 MED ORDER — PANTOPRAZOLE SODIUM 40 MG PO TBEC
40.0000 mg | DELAYED_RELEASE_TABLET | Freq: Every day | ORAL | Status: DC
  Administered 2021-04-13 – 2021-04-14 (×2): 40 mg via ORAL
  Filled 2021-04-13 (×2): qty 1

## 2021-04-13 MED ORDER — LEVETIRACETAM 500 MG PO TABS
500.0000 mg | ORAL_TABLET | Freq: Two times a day (BID) | ORAL | Status: DC
  Administered 2021-04-13 – 2021-04-14 (×3): 500 mg via ORAL
  Filled 2021-04-13 (×3): qty 1

## 2021-04-13 NOTE — Progress Notes (Signed)
Pharmacy Antibiotic Note  Tracey Armstrong is a 86 y.o. female admitted on 04/08/2021 with  UTI .  Pharmacy has been consulted for Ertapenem dosing.   ID: s/p 3d CTX for UTI > starting ertapenem for ESBL UTI, UA neg but urinary symp per MD + febrile.  - COVID-19 infection: Diagnosed on 6/14. Observation - 6/23 UA: neg leuko - 621 UA: neg leuko  - Tmax 99.3. WBC 5.7, Scr <1  CTX 6/21 >> 6/23 Ertapenem 6/24 >> (6/30)  6/21 Ucx: 100k e. Coli, 100k k pneumo (EBSL) 6/24 bcx> IP  Plan: Starting ertapenem x7d per ID rec 1000mg /24h (adjust if CrCl<30) Pharmacy will sign off. Please reconsult for further dosing assitance.     Height: 5\' 2"  (157.5 cm) Weight: 58.4 kg (128 lb 12 oz) IBW/kg (Calculated) : 50.1  Temp (24hrs), Avg:98.5 F (36.9 C), Min:98 F (36.7 C), Max:99.1 F (37.3 C)  Recent Labs  Lab 04/08/21 1920 04/08/21 1928 04/08/21 2244 04/09/21 0447 04/10/21 0307 04/11/21 0300 04/12/21 0421 04/13/21 0217  WBC  --    < >  --  8.4 9.7 7.8 7.1 5.7  CREATININE  --    < >  --  0.86 0.75 0.80 0.80 0.83  LATICACIDVEN 2.2*  --  1.5  --   --   --   --   --    < > = values in this interval not displayed.    Estimated Creatinine Clearance: 39.9 mL/min (by C-G formula based on SCr of 0.83 mg/dL).    Allergies  Allergen Reactions   Dihydrotachysterol Other (See Comments)    Dihydrotachysterol is a form of vitamin D. Might be "DHT Intensol" (Name Brand)- Reaction unknown- tolerating 50,000 units of Vitamin D-2 (Drisdol) in 2022   Trazodone And Nefazodone Other (See Comments)    Worsens headaches   Valium [Diazepam] Other (See Comments)    headaches   Citalopram Other (See Comments)    Other reaction(s): Headache   Other Other (See Comments)    "States all antidepressants cause migraines." - tolerates Xanax     Abdulwahab Demelo S. Alford Highland, PharmD, BCPS Clinical Staff Pharmacist [email or web address]  Wayland Salinas 04/13/2021 12:02 PM

## 2021-04-13 NOTE — Evaluation (Signed)
Physical Therapy Re-Evaluation Patient Details Name: Tracey Armstrong MRN: [id number] DOB: May 06, 1935 Today's Date: 04/13/2021   History of Present Illness  Pt adm 6/20 with AMS possibly due to UTI. Pt also hypertensive after she stopped taking antihypertensives 1 week prior to DC. Pt also covid + at ED visit (6/14). Hospital stay complicated by seziures. MRI brain: 1 cm calcified meningioma over the right parietal convexity without any mass-effect. PMH - Lt femur fx,  compression fx of L2, anxiety/depression, GERD, chronic pain, and HTN.   Clinical Impression  Pt's hospital stay complicated by pt having seizures, thus performed a PT Re-Evaluation to assess changes. Pt demonstrates fairly symmetrical and intact bil UE and lower extremity coordination and sensation and generalized weakness. Pt with balance deficits and decreased activity tolerance. Cognition appears to be intact and at her baseline. Pt also with a fear of falling, but no LOB while utilizing a rollator for mobility this date. Pt needing min guard assist for all functional mobility currently. Will continue to follow acutely. Pt would benefit from follow-up with HHPT.    Follow Up Recommendations Home health PT;Supervision for mobility/OOB    Equipment Recommendations  None recommended by PT    Recommendations for Other Services       Precautions / Restrictions Precautions Precautions: Fall Restrictions Weight Bearing Restrictions: No      Mobility  Bed Mobility Overal bed mobility: Needs Assistance Bed Mobility: Supine to Sit;Sit to Supine     Supine to sit: Min guard Sit to supine: Min guard   General bed mobility comments: Pt needing increased time for all bed mobility, demonstrating ability to transition supine <> sit EOB with bed flat and no use of rails with min guard.    Transfers Overall transfer level: Needs assistance Equipment used: 4-wheeled walker Transfers: Sit to/from [facility] Sit  to Stand: Min guard Stand pivot transfers: Min guard       General transfer comment: Pt recalls verbally need to lock brakes prior to transfers. Min guard for safety with increased time to power to stand. No LOB.  Ambulation/Gait Ambulation/Gait assistance: Min guard Gait Distance (Feet): 40 Feet (x4 bouts of ~40 ft > ~3 ft > ~15 ft > ~15 ft) Assistive device: 4-wheeled walker Gait Pattern/deviations: Step-through pattern;Decreased step length - right;Decreased step length - left;Trunk flexed Gait velocity: decr Gait velocity interpretation: <1.8 ft/sec, indicate of risk for recurrent falls General Gait Details: Pt with kyphotic posture, taking small bil steps at decreased pace. No LOB, but pt anxious in regards to fear of falling requetsing PT hold onto her, min guard for safety.  Stairs            Wheelchair Mobility    Modified Rankin (Stroke Patients Only) Modified Rankin (Stroke Patients Only) Pre-Morbid Rankin Score: Moderately severe disability Modified Rankin: Moderately severe disability     Balance Overall balance assessment: Needs assistance Sitting-balance support: No upper extremity supported;Feet supported Sitting balance-Leahy Scale: Fair Sitting balance - Comments: Static sitting EOB with supervision.   Standing balance support: Bilateral upper extremity supported Standing balance-Leahy Scale: Poor Standing balance comment: rollator and min guard for static standing                             Pertinent Vitals/Pain Pain Assessment: Faces Faces Pain Scale: Hurts a little bit Pain Location: back Pain Descriptors / Indicators: Sore Pain Intervention(s): Limited activity within patient's tolerance;Monitored during session;Repositioned;Other (comment) (applied lotion to  red, itchy area and notified RN)    Home Living Family/patient expects to be discharged to:: Private residence Living Arrangements: Alone Available Help at Discharge:  Personal care attendant;Available 24 hours/day Type of Home: Independent living facility Home Access: Level entry;Elevator     Home Layout: One level Home Equipment: Walker - 4 wheels      Prior Function Level of Independence: Needs assistance   Gait / Transfers Assistance Needed: amb with rollator with assist at times           Hand Dominance   Dominant Hand: Right    Extremity/Trunk Assessment   Upper Extremity Assessment Upper Extremity Assessment: Generalized weakness (fairly symmetrical strength with MMT, intact sensation bil, intact coordination bil)    Lower Extremity Assessment Lower Extremity Assessment: Generalized weakness (fairly symmetrical strength with MMT, intact sensation bil, intact coordination bil)    Cervical / Trunk Assessment Cervical / Trunk Assessment: Kyphotic  Communication   Communication: No difficulties  Cognition Arousal/Alertness: Awake/alert Behavior During Therapy: Anxious Overall Cognitive Status: Within Functional Limits for tasks assessed                                 General Comments: A&Ox4. Pt perseverating on asking her sons to retrieve some clothes from her home, but states this is her normal motherly instincts. Pt anxious and reports fear of falling, asking therapist to walk behind her and hold onto her with mobility.      General Comments General comments (skin integrity, edema, etc.): Pt's sons present during session; edema noted R hand and forearm, RN notified    Exercises     Assessment/Plan    PT Assessment Patient needs continued PT services  PT Problem List Decreased strength;Decreased activity tolerance;Decreased balance;Decreased mobility;Decreased range of motion       PT Treatment Interventions DME instruction;Gait training;Functional mobility training;Therapeutic activities;Therapeutic exercise;Balance training;Patient/family education;Neuromuscular re-education    PT Goals (Current goals  can be found in the Care Plan section)  Acute Rehab PT Goals Patient Stated Goal: to get her clothes from her home PT Goal Formulation: With patient/family Time For Goal Achievement: 04/27/21 Potential to Achieve Goals: Good    Frequency Min 3X/week   Barriers to discharge        Co-evaluation               AM-PAC PT "6 Clicks" Mobility  Outcome Measure Help needed turning from your back to your side while in a flat bed without using bedrails?: A Little Help needed moving from lying on your back to sitting on the side of a flat bed without using bedrails?: A Little Help needed moving to and from a bed to a chair (including a wheelchair)?: A Little Help needed standing up from a chair using your arms (e.g., wheelchair or bedside chair)?: A Little Help needed to walk in hospital room?: A Little Help needed climbing 3-5 steps with a railing? : A Lot 6 Click Score: 17    End of Session Equipment Utilized During Treatment: Gait belt Activity Tolerance: Patient tolerated treatment well Patient left: with call bell/phone within reach;in bed;with bed alarm set Nurse Communication: Mobility status;Other (comment);Patient requests pain meds (redness on back, edema in R hand/forearm) PT Visit Diagnosis: Other abnormalities of gait and mobility (R26.89);Muscle weakness (generalized) (M62.81);Unsteadiness on feet (R26.81);Difficulty in walking, not elsewhere classified (R26.2);History of falling (Z91.81)    Time: 9622-2979 PT Time Calculation (min) (ACUTE ONLY):  54 min   Charges:   PT Evaluation $PT Re-evaluation: 1 Re-eval PT Treatments $Gait Training: 23-37 mins $Therapeutic Activity: 8-22 mins        Moishe Spice, PT, DPT Acute Rehabilitation Services  Pager: [phone] Office: [place] 04/13/2021, 2:27 PM

## 2021-04-13 NOTE — Progress Notes (Signed)
STROKE TEAM PROGRESS NOTE   SUBJECTIVE RN at bedside, patient lying in bed, awake alert, orientated, neuro stable, no seizure episode overnight.  Change Keppra from IV to p.o.  OBJECTIVE Vitals:   04/13/21 1100 04/13/21 1200  BP: (!) 156/74 (!) 133/58  Pulse:  81  Resp:    Temp:  98.9 F (37.2 C)  SpO2:  95%     Physical Exam Frail elderly Caucasian lady not in distress. . Afebrile. Head is nontraumatic. Neck is supple without bruit.    Cardiac exam no murmur or gallop. Lungs are clear to auscultation. Distal pulses are well felt.  Neurological Exam :  She is awake alert oriented to person and place only.  She has diminished attention, registration and recall.  Speech appears clear and fluent without hesitancy or aphasia.  She has good comprehension.  She follows commands well.  Extraocular movements are full range without nystagmus.  She blinks to threat bilaterally.  Face is symmetric without weakness.  Tongue midline.  Motor system exam shows symmetric upper and lower extremity strength without focal weakness.  Sensation intact bilaterally.  Coordination is slow but accurate.  Gait not tested.  Pertinent Laboratory Studies (past 3 days) / Diagnostics (past 24h) Recent Labs    04/11/21 0300 04/12/21 0421 04/13/21 0217  WBC 7.8 7.1 5.7  HGB 11.2* 10.2* 10.3*  PLT 240 206 188  NA 134* 134* 133*  K 3.3* 3.9 3.8  CREATININE 0.80 0.80 0.83  GLUCOSE 112* 87 92    MR BRAIN WO CONTRAST  Result Date: 04/13/2021 CLINICAL DATA:  Follow-up examination for stroke. EXAM: MRI HEAD WITHOUT CONTRAST TECHNIQUE: Multiplanar, multiecho pulse sequences of the brain and surrounding structures were obtained without intravenous contrast. COMPARISON:  Prior CTs from earlier the same day as well as from 04/11/2021. FINDINGS: Brain: Mild age-related cerebral atrophy with chronic small vessel ischemic disease. No evidence for acute or interval infarction. Gray-white matter differentiation maintained. No  evidence for acute or chronic intracranial hemorrhage. 1 cm calcified meningioma overlying the right parietal convexity again noted. No associated mass effect. No other mass lesion, mass effect, or midline shift. No hydrocephalus or extra-axial fluid collection. Pituitary gland suprasellar region normal. Midline structures intact. Vascular: Major intracranial vascular flow voids are maintained. Skull and upper cervical spine: Craniocervical junction normal. Bone marrow signal intensity normal. No scalp soft tissue abnormality. Sinuses/Orbits: Prior bilateral ocular lens replacement. Scattered mucosal thickening within the ethmoidal air cells. No mastoid effusion. Other: None. IMPRESSION: 1. Stable brain MRI. No acute intracranial infarct or other abnormality. 2. Atrophy with chronic microvascular ischemic disease. 3. 1 cm right parietal convexity meningioma. Electronically Signed   By: Jeannine Boga M.D.   On: 04/13/2021 04:53     ASSESSMENT [name] is a 86 y.o. female with with sudden onset of mental status change with aphasia left gaze deviation thought to be left MCA branch infarct treated with IV tPA but subsequently found to have focal seizures and nonconvulsive status which is responded well to IV Ativan and Keppra.  She has an ongoing urinary tract infection with E. coli and ESBL was on ceftriaxone which has since now been .  Discontinued.  She spiked fever overnight but has been started now on ertapenem   IMPRESSION Strokelike episode likely due to unwitnessed seizure followed by postictal state and nonconvulsive status epilepticus.  Underlying etiology likely encephalopathy from urinary tract infection and possibly antibiotic effect.   RECOMMENDATIONS - MRI negative for acute stroke.   -  transfer to hospitalist service and transfer out of ICU - Discontinued long-term EEG monitoring.   - Continue Keppra 500 twice daily for seizure. Changed to po  - Continue treatment for UTI  as per primary team.  Blood culture pending - PT/OT  Neurology will sign off. Please call with questions. Pt will follow up with Dr. Leonie Armstrong at [facility] in about 4 weeks.   Hospital day # 4  Tracey Hawking, MD PhD Stroke Neurology 04/13/2021 3:45 PM    To contact Stroke Continuity provider, please refer to [email or web address] After hours, contact General Neurology

## 2021-04-13 NOTE — Plan of Care (Signed)

## 2021-04-13 NOTE — Progress Notes (Signed)
Received from ICU in bed.  Oriented to room and surroundings.

## 2021-04-13 NOTE — Progress Notes (Signed)
PROGRESS NOTE    Tracey Armstrong  CBU:[id number] DOB: 1935/09/01 DOA: 04/08/2021 PCP: [place], [name], MD   Brief Narrative: Tracey Armstrong is a 86 y.o. female with a history of hypertension, chronic pain, anemia. She presented from her ILF secondary to confusion. While admitted, she was found to have a UTI. More significantly, she was found to have concerns for acute stroke with aphasia and left-sided gaze and was given tPA. During evaluation she was found to be in status epilepticus, resolved with Keppra.   Assessment & Plan:   Principal Problem:   Acute metabolic encephalopathy Active Problems:   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HCC)   GERD without esophagitis   Hypertensive urgency   Lactic acidosis   Lab test positive for detection of COVID-19 virus   Abnormal urinalysis   Urinary tract infection due to ESBL Klebsiella   Acute metabolic encephalopathy Initially thought secondary to COVID infection, UTI, medications. On 6/23, patient was noticed to have new aphasia with left-sided gaze preference with initial concern for acute CVA.  She started on tPA on 6/23 and moved to the ICU where she was found to have evidence of status epilepticus.  She is started on Keppra with improvement.  Neurology was consulted at the time.  Fever Thought to be secondary to seizures with initial concern for possible encephalitis.  MRI without features consistent with encephalitis.  No associated leukocytosis.  T-max of 101.3 F with no recurrence.  Afebrile for the last 48 hours. Blood cultures with no growth to date -Follow-up blood cultures  Primary hypertension -Continue verapamil 180 mg  UTI ESBL Klebsiella and E. Coli on urine culture.  Empirically started on ceftriaxone and transition to ertapenem on 6/24 for treatment.  Will treat for total of 5 days.  If patient is ready for discharge prior to completing IV course, will consider ertapenem IM as an outpatient versus fosfomycin dose prior to  discharge -Continue ertapenem IV  COVID-19 infection Incidental finding.  Patient is fully vaccinated.  No symptoms.  Diagnosed on 6/14 and was on isolation for 10 days.  Isolation discontinued.  Meningioma Incidental on MRI.  Stable.  Outpatient follow-up  GERD -Continue Protonix  Chronic back pain -Continue Neurontin and Zanaflex  Anxiety -Continue Xanax as needed  Constipation -Continue MiraLAX and senna in addition to Dulcolax   DVT prophylaxis: sCDs Code Status:   Code Status: DNR Family Communication: None at bedside Disposition Plan: Discharge home vs SNF likely in 2-5 days   Consultants:  Neurology  Procedures:  EEG (6/23) IMPRESSION: This study was initially suggestive of non convulsive status epilepticus which improved after 1645. EEG was then suggestive of cortical dysfunction arising from right hemisphere, likely secondary to underlying structural abnormality, post-ictal state. Additionally there is moderate diffuse encephalopathy, nonspecific etiology but likely related to sedation, seizure.  Antimicrobials: Ceftriaxone IV Ertapenem IV    Subjective: No concerns this morning  Objective: Vitals:   04/13/21 0500 04/13/21 0600 04/13/21 1100 04/13/21 1200  BP:   (!) 156/74   Pulse: 80 81    Resp: 17 14    Temp:    98.9 F (37.2 C)  TempSrc:    Axillary  SpO2: 95% 95%    Weight:      Height:        Intake/Output Summary (Last 24 hours) at 04/13/2021 1220 Last data filed at 04/13/2021 0600 Gross per 24 hour  Intake 1077.23 ml  Output 1500 ml  Net -422.77 ml   [employer]  04/08/21 1951 04/10/21 0456 04/11/21 0101  Weight: 62 kg 58.2 kg 58.4 kg    Examination:  General exam: Appears calm and comfortable Respiratory system: Clear to auscultation. Respiratory effort normal. Cardiovascular system: S1 & S2 heard, RRR. No murmurs, rubs, gallops or clicks. Gastrointestinal system: Abdomen is nondistended, soft and nontender. No organomegaly  or masses felt. Normal bowel sounds heard. Central nervous system: Alert and oriented. No focal neurological deficits. Musculoskeletal: No edema. No calf tenderness Skin: No cyanosis. No rashes Psychiatry: Judgement and insight appear normal. Mood & affect appropriate.     Data Reviewed: I have personally reviewed following labs and imaging studies  CBC Lab Results  Component Value Date   WBC 5.7 04/13/2021   RBC 3.15 (L) 04/13/2021   HGB 10.3 (L) 04/13/2021   HCT 29.9 (L) 04/13/2021   MCV 94.9 04/13/2021   MCH 32.7 04/13/2021   PLT 188 04/13/2021   MCHC 34.4 04/13/2021   RDW 13.3 04/13/2021   LYMPHSABS 2.6 04/13/2021   MONOABS 0.5 04/13/2021   EOSABS 0.1 04/13/2021   BASOSABS 0.0 31/59/4585     Last metabolic panel Lab Results  Component Value Date   NA 133 (L) 04/13/2021   K 3.8 04/13/2021   CL 103 04/13/2021   CO2 24 04/13/2021   BUN 8 04/13/2021   CREATININE 0.83 04/13/2021   GLUCOSE 92 04/13/2021   GFRNONAA >60 04/13/2021   GFRAA 60 09/05/2020   CALCIUM 8.6 (L) 04/13/2021   PROT 5.1 (L) 04/12/2021   ALBUMIN 2.9 (L) 04/12/2021   LABGLOB 2.5 02/26/2017   AGRATIO 1.7 02/26/2017   BILITOT 0.3 04/12/2021   ALKPHOS 33 (L) 04/12/2021   AST 34 04/12/2021   ALT 42 04/12/2021   ANIONGAP 6 04/13/2021    CBG (last 3)  No results for input(s): GLUCAP in the last 72 hours.   GFR: Estimated Creatinine Clearance: 39.9 mL/min (by C-G formula based on SCr of 0.83 mg/dL).  Coagulation Profile: Recent Labs  Lab 04/08/21 1928  INR 1.1    Recent Results (from the past 240 hour(s))  Urine culture     Status: Abnormal   Collection Time: 04/09/21  2:00 AM   Specimen: In/Out Cath Urine  Result Value Ref Range Status   Specimen Description IN/OUT CATH URINE  Final   Special Requests   Final    NONE Performed at [facility], [address] [place], [place] [address]    Culture (A)  Final    >=100,000 COLONIES/mL ESCHERICHIA COLI >=100,000 COLONIES/mL  KLEBSIELLA PNEUMONIAE Confirmed Extended Spectrum Beta-Lactamase Producer (ESBL).  In bloodstream infections from ESBL organisms, carbapenems are preferred over piperacillin/tazobactam. They are shown to have a lower risk of mortality.    Report Status 04/12/2021 FINAL  Final   Organism ID, Bacteria ESCHERICHIA COLI (A)  Final   Organism ID, Bacteria KLEBSIELLA PNEUMONIAE (A)  Final      Susceptibility   Escherichia coli - MIC*    AMPICILLIN 8 SENSITIVE Sensitive     CEFAZOLIN <=4 SENSITIVE Sensitive     CEFEPIME <=0.12 SENSITIVE Sensitive     CEFTRIAXONE <=0.25 SENSITIVE Sensitive     CIPROFLOXACIN <=0.25 SENSITIVE Sensitive     GENTAMICIN <=1 SENSITIVE Sensitive     IMIPENEM <=0.25 SENSITIVE Sensitive     NITROFURANTOIN 32 SENSITIVE Sensitive     TRIMETH/SULFA <=20 SENSITIVE Sensitive     AMPICILLIN/SULBACTAM 4 SENSITIVE Sensitive     PIP/TAZO <=4 SENSITIVE Sensitive     * >=100,000 COLONIES/mL ESCHERICHIA  COLI   Klebsiella pneumoniae - MIC*    AMPICILLIN >=32 RESISTANT Resistant     CEFAZOLIN >=64 RESISTANT Resistant     CEFEPIME >=32 RESISTANT Resistant     CEFTRIAXONE >=64 RESISTANT Resistant     CIPROFLOXACIN 0.5 SENSITIVE Sensitive     GENTAMICIN <=1 SENSITIVE Sensitive     IMIPENEM 1 SENSITIVE Sensitive     NITROFURANTOIN 64 INTERMEDIATE Intermediate     TRIMETH/SULFA >=320 RESISTANT Resistant     AMPICILLIN/SULBACTAM >=32 RESISTANT Resistant     PIP/TAZO <=4 SENSITIVE Sensitive     * >=100,000 COLONIES/mL KLEBSIELLA PNEUMONIAE  MRSA Next Gen by PCR, Nasal     Status: None   Collection Time: 04/11/21  4:03 PM   Specimen: Nasal Mucosa; Nasal Swab  Result Value Ref Range Status   MRSA by PCR Next Gen NOT DETECTED NOT DETECTED Final    Comment: (NOTE) The GeneXpert MRSA Assay (FDA approved for NASAL specimens only), is one component of a comprehensive MRSA colonization surveillance program. It is not intended to diagnose MRSA infection nor to guide or monitor treatment  for MRSA infections. Test performance is not FDA approved in patients less than 63 years old. Performed at [facility], [place] [address] [place], [place] [address]   Culture, blood (routine x 2)     Status: None (Preliminary result)   Collection Time: 04/12/21  9:30 AM   Specimen: BLOOD LEFT HAND  Result Value Ref Range Status   Specimen Description BLOOD LEFT HAND  Final   Special Requests   Final    BOTTLES DRAWN AEROBIC ONLY Blood Culture adequate volume   Culture   Final    NO GROWTH < 24 HOURS Performed at [facility], [place] [address], [place], [place] [address]    Report Status PENDING  Incomplete  Culture, blood (routine x 2)     Status: None (Preliminary result)   Collection Time: 04/12/21  9:40 AM   Specimen: BLOOD RIGHT HAND  Result Value Ref Range Status   Specimen Description BLOOD RIGHT HAND  Final   Special Requests   Final    BOTTLES DRAWN AEROBIC ONLY Blood Culture adequate volume   Culture   Final    NO GROWTH < 24 HOURS Performed at [facility], [place] [address], [place], [place] [address]    Report Status PENDING  Incomplete        Radiology Studies: CT HEAD WO CONTRAST  Result Date: 04/12/2021 CLINICAL DATA:  Stroke follow-up. EXAM: CT HEAD WITHOUT CONTRAST TECHNIQUE: Contiguous axial images were obtained from the base of the skull through the vertex without intravenous contrast. COMPARISON:  CT head May 11, 2021. FINDINGS: Brain: No evidence of acute large vascular territory infarction, hemorrhage, hydrocephalus, extra-axial collection or mass lesion/mass effect. Similar patchy white matter hypoattenuation, compatible with chronic microvascular ischemic disease. Similar mild atrophy with ex vacuo ventricular dilation. Vascular: No hyperdense vessel identified. Calcified atherosclerosis. Skull: No acute fracture. Sinuses/Orbits: Opacified right posterior ethmoid air cell. Maxillary torus. No acute or orbital abnormality. Other: No mastoid  effusions. IMPRESSION: 1. No evidence of acute intracranial abnormality. 2. Chronic microvascular disease and atrophy. Electronically Signed   By: Margaretha Sheffield MD   On: 04/12/2021 15:36   CT HEAD WO CONTRAST  Result Date: 04/11/2021 CLINICAL DATA:  Altered mental status EXAM: CT HEAD WITHOUT CONTRAST TECHNIQUE: Contiguous axial images were obtained from the base of the skull through the vertex without intravenous contrast. COMPARISON:  CT  from earlier in the same day. FINDINGS: Brain: No evidence of acute infarction, hemorrhage, hydrocephalus, extra-axial collection or mass lesion/mass effect. Chronic atrophic and ischemic changes are again identified similar to that seen on prior exam. Calcified right parietal meningioma is again seen and stable. Residual contrast is noted from prior CTA of the head Vascular: No hyperdense vessel or unexpected calcification. Skull: Normal. Negative for fracture or focal lesion. Sinuses/Orbits: No acute finding. Other: None. IMPRESSION: Chronic atrophic and ischemic changes without acute abnormality. Electronically Signed   By: Inez Catalina M.D.   On: 04/11/2021 17:58   MR BRAIN WO CONTRAST  Result Date: 04/13/2021 CLINICAL DATA:  Follow-up examination for stroke. EXAM: MRI HEAD WITHOUT CONTRAST TECHNIQUE: Multiplanar, multiecho pulse sequences of the brain and surrounding structures were obtained without intravenous contrast. COMPARISON:  Prior CTs from earlier the same day as well as from 04/11/2021. FINDINGS: Brain: Mild age-related cerebral atrophy with chronic small vessel ischemic disease. No evidence for acute or interval infarction. Gray-white matter differentiation maintained. No evidence for acute or chronic intracranial hemorrhage. 1 cm calcified meningioma overlying the right parietal convexity again noted. No associated mass effect. No other mass lesion, mass effect, or midline shift. No hydrocephalus or extra-axial fluid collection. Pituitary gland  suprasellar region normal. Midline structures intact. Vascular: Major intracranial vascular flow voids are maintained. Skull and upper cervical spine: Craniocervical junction normal. Bone marrow signal intensity normal. No scalp soft tissue abnormality. Sinuses/Orbits: Prior bilateral ocular lens replacement. Scattered mucosal thickening within the ethmoidal air cells. No mastoid effusion. Other: None. IMPRESSION: 1. Stable brain MRI. No acute intracranial infarct or other abnormality. 2. Atrophy with chronic microvascular ischemic disease. 3. 1 cm right parietal convexity meningioma. Electronically Signed   By: Jeannine Boga M.D.   On: 04/13/2021 04:53   CT CEREBRAL PERFUSION W CONTRAST  Result Date: 04/11/2021 CLINICAL DATA:  Code stroke follow-up EXAM: CT ANGIOGRAPHY HEAD AND NECK CT PERFUSION BRAIN TECHNIQUE: Multidetector CT imaging of the head and neck was performed using the standard protocol during bolus administration of intravenous contrast. Multiplanar CT image reconstructions and MIPs were obtained to evaluate the vascular anatomy. Carotid stenosis measurements (when applicable) are obtained utilizing NASCET criteria, using the distal internal carotid diameter as the denominator. Multiphase CT imaging of the brain was performed following IV bolus contrast injection. Subsequent parametric perfusion maps were calculated using RAPID software. CONTRAST:  113mL OMNIPAQUE IOHEXOL 350 MG/ML SOLN COMPARISON:  None. FINDINGS: CTA NECK Aortic arch: Calcified and noncalcified plaque along the arch and patent great vessel origins. No high-grade stenosis of the proximal subclavian arteries. Right carotid system: Patent. Noncalcified plaque along the proximal internal carotid causing less than 50% stenosis. Left carotid system: Patent. Mild mixed plaque along the proximal internal carotid causing minimal stenosis. Vertebral arteries: Patent. Right vertebral artery slightly dominant. No stenosis or evidence  of dissection. Skeleton: Degenerative changes of the cervical spine. Other neck: Unremarkable. Upper chest: No apical lung mass. Review of the MIP images confirms the above findings CTA HEAD Anterior circulation: Intracranial internal carotid arteries are patent with minor calcified plaque. Anterior and middle cerebral arteries are patent. Posterior circulation: Intracranial vertebral arteries are patent. Basilar artery is patent. Major cerebellar artery origins are patent. Posterior communicating arteries are present. Posterior cerebral arteries are patent. Venous sinuses: Patent as allowed by contrast bolus timing. Review of the MIP images confirms the above findings CT Brain Perfusion Findings: ASPECTS: 10 CBF (<30%) Volume: 8mL Perfusion (Tmax>6.0s) volume: 66mL Mismatch Volume: 1mL Infarction Location: None.  IMPRESSION: No large vessel occlusion, hemodynamically significant stenosis, or evidence of dissection. Perfusion imaging demonstrates no evidence of core infarction or penumbra. These results were communicated to Dr. Leonie Man at 3:26 pm on 04/11/2021 by text page via the [facility] messaging system. Electronically Signed   By: Macy Mis M.D.   On: 04/11/2021 15:36   DG Chest Port 1V same Day  Result Date: 04/12/2021 CLINICAL DATA:  Shortness of breath. EXAM: PORTABLE CHEST 1 VIEW COMPARISON:  04/08/2021 FINDINGS: Single view of the chest was obtained. Mild blunting at the left costophrenic angle but minimal change. Otherwise, the lungs are clear. Heart and mediastinum are within normal limits. Evidence for leads overlying the left upper chest. Negative for pneumothorax. IMPRESSION: 1. No acute chest abnormality. 2. Mild blunting at the left costophrenic angle. Findings could be related to scarring or minimal pleural fluid. Electronically Signed   By: Markus Daft M.D.   On: 04/12/2021 11:00   EEG adult  Result Date: 04/11/2021 Lora Havens, MD     04/11/2021  5:21 PM Patient Name: Tracey Armstrong MRN:  [id number] Epilepsy Attending: Lora Havens Referring Physician/Provider: Anibal Henderson, NP Date: 04/11/2021 Duration: 20.25 mins Patient history: 86yo F with sudden worsening of mental status. EEG to evaluate for seizure Level of alertness: Awake AEDs during EEG study: None Technical aspects: This EEG study was done with scalp electrodes positioned according to the 10-20 International system of electrode placement. Electrical activity was acquired at a sampling rate of 500Hz  and reviewed with a high frequency filter of 70Hz  and a low frequency filter of 1Hz . EEG data were recorded continuously and digitally stored. Description: No posterior dominant rhythm was seen. EEG initially showed generalized, maximal bifrontal periodic epileptiform  discharges at 3hz  consistent with convulsive status epilepticus. On video, no clnical signs were noted. EEG spontaneously improved after 1645. EEG then showed continuous generalized 3 to 6 Hz theta-delta slowing. Iv ativan 2mg  was administered at 1648 after which eeg showed generalized and lateralized right hemisphere 3-6hz  theta-delta slowing admixed with 15-18hz  generalized beta activity. Hyperventilation and photic stimulation were not performed.   ABNORMALITY - Non convulsive status epilepticus, generalized - Continuous slow, generalized - Excessive beta, generalized IMPRESSION: This study was initially suggestive of non convulsive status epilepticus which improved after 1645. EEG was then suggestive of cortical dysfunction arising from right hemisphere, likely secondary to underlying structural abnormality, post-ictal state. Additionally there is moderate diffuse encephalopathy, nonspecific etiology but likely related to sedation, seizure. Dr Lorrin Goodell was notified. Priyanka Barbra Sarks   Overnight EEG with video  Result Date: 04/12/2021 Lora Havens, MD     04/12/2021  2:26 PM Patient Name: Tracey Armstrong MRN: [id number] Epilepsy Attending: Lora Havens Referring  Physician/Provider: Dr Antony Contras Duration: 04/11/2021 1721 to 04/12/2021 1354  Patient history: 86yo F with sudden worsening of mental status. EEG to evaluate for seizure  Level of alertness: Awake, asleep  AEDs during EEG study: LEV, GBP  Technical aspects: This EEG study was done with scalp electrodes positioned according to the 10-20 International system of electrode placement. Electrical activity was acquired at a sampling rate of 500Hz  and reviewed with a high frequency filter of 70Hz  and a low frequency filter of 1Hz . EEG data were recorded continuously and digitally stored.  Description: No posterior dominant rhythm was seen.  Sleep was characterized by sleep spindles (12 to 14 Hz), maximal frontocentral region.  EEG showed intermittent generalized and lateralized right hemisphere 3-6hz  theta-delta slowing. Hyperventilation and photic stimulation were  not performed.    ABNORMALITY -Intermittent slow, generalized and lateralized right hemisphere  IMPRESSION: This study is suggestive of non specific cortical dysfunction arising from right hemisphere. Additionally there is mild to moderate diffuse encephalopathy, nonspecific etiology but likely related to sedation, seizure. EEG appears to be improving compared to previous day.  Lora Havens   ECHOCARDIOGRAM COMPLETE  Result Date: 04/12/2021    ECHOCARDIOGRAM REPORT   Patient Name:   Tracey Armstrong Date of Exam: 04/12/2021 Medical Rec #:  [id number]     Height:       62.0 in Accession #:    [id number]    Weight:       128.7 lb Date of Birth:  11-02-1934      BSA:          1.585 m Patient Age:    44 years      BP:           147/79 mmHg Patient Gender: F             HR:           93 bpm. Exam Location:  Inpatient Procedure: 2D Echo, 3D Echo, Cardiac Doppler and Color Doppler Indications:    Stroke.  History:        Patient has prior history of Echocardiogram examinations, most                 recent 11/09/2015. CAD; Risk Factors:Hypertension and                  Dyslipidemia.  Sonographer:    Roseanna Rainbow RDCS Referring Phys: [id number] [place]  Sonographer Comments: Technically difficult study due to poor echo windows. Chest wall deformity. IMPRESSIONS  1. Left ventricular ejection fraction, by estimation, is 60 to 65%. The left ventricle has normal function. The left ventricle has no regional wall motion abnormalities. There is mild left ventricular hypertrophy of the basal-septal segment. Left ventricular diastolic parameters are consistent with Grade I diastolic dysfunction (impaired relaxation).  2. Right ventricular systolic function is normal. The right ventricular size is normal.  3. The mitral valve is normal in structure. Trivial mitral valve regurgitation. No evidence of mitral stenosis.  4. The aortic valve is tricuspid. Aortic valve regurgitation is not visualized. Mild aortic valve sclerosis is present, with no evidence of aortic valve stenosis.  5. The inferior vena cava is normal in size with greater than 50% respiratory variability, suggesting right atrial pressure of 3 mmHg. FINDINGS  Left Ventricle: Left ventricular ejection fraction, by estimation, is 60 to 65%. The left ventricle has normal function. The left ventricle has no regional wall motion abnormalities. The left ventricular internal cavity size was normal in size. There is  mild left ventricular hypertrophy of the basal-septal segment. Left ventricular diastolic parameters are consistent with Grade I diastolic dysfunction (impaired relaxation). Indeterminate filling pressures. Right Ventricle: The right ventricular size is normal. Right ventricular systolic function is normal. Left Atrium: Left atrial size was normal in size. Right Atrium: Right atrial size was normal in size. Pericardium: There is no evidence of pericardial effusion. Mitral Valve: The mitral valve is normal in structure. Mild mitral annular calcification. Trivial mitral valve regurgitation. No evidence of mitral valve  stenosis. Tricuspid Valve: The tricuspid valve is normal in structure. Tricuspid valve regurgitation is trivial. No evidence of tricuspid stenosis. Aortic Valve: The aortic valve is tricuspid. Aortic valve regurgitation is not visualized. Mild aortic valve sclerosis is present, with no evidence  of aortic valve stenosis. Pulmonic Valve: The pulmonic valve was normal in structure. Pulmonic valve regurgitation is not visualized. No evidence of pulmonic stenosis. Aorta: The aortic root is normal in size and structure. Venous: The inferior vena cava is normal in size with greater than 50% respiratory variability, suggesting right atrial pressure of 3 mmHg. IAS/Shunts: No atrial level shunt detected by color flow Doppler.  LEFT VENTRICLE PLAX 2D LVIDd:         4.20 cm     Diastology LVIDs:         2.40 cm     LV e' medial:    5.98 cm/s LV PW:         0.90 cm     LV E/e' medial:  13.7 LV IVS:        1.20 cm     LV e' lateral:   7.40 cm/s LVOT diam:     1.90 cm     LV E/e' lateral: 11.1 LV SV:         71 LV SV Index:   45 LVOT Area:     2.84 cm  LV Volumes (MOD) LV vol d, MOD A2C: 54.9 ml LV vol d, MOD A4C: 43.3 ml LV vol s, MOD A2C: 24.6 ml LV vol s, MOD A4C: 19.8 ml LV SV MOD A2C:     30.3 ml LV SV MOD A4C:     43.3 ml LV SV MOD BP:      29.6 ml RIGHT VENTRICLE             IVC RV S prime:     17.60 cm/s  IVC diam: 2.00 cm TAPSE (M-mode): 1.9 cm LEFT ATRIUM             Index       RIGHT ATRIUM           Index LA diam:        3.70 cm 2.33 cm/m  RA Area:     11.20 cm LA Vol (A2C):   36.3 ml 22.90 ml/m RA Volume:   23.10 ml  14.57 ml/m LA Vol (A4C):   37.1 ml 23.40 ml/m LA Biplane Vol: 37.4 ml 23.59 ml/m  AORTIC VALVE LVOT Vmax:   132.00 cm/s LVOT Vmean:  91.100 cm/s LVOT VTI:    0.249 m  AORTA Ao Root diam: 2.90 cm Ao Asc diam:  3.10 cm MITRAL VALVE MV Area (PHT): 3.53 cm     SHUNTS MV Decel Time: 215 msec     Systemic VTI:  0.25 m MV E velocity: 82.10 cm/s   Systemic Diam: 1.90 cm MV A velocity: 130.00 cm/s MV E/A  ratio:  0.63 Kirk Ruths MD Electronically signed by Kirk Ruths MD Signature Date/Time: 04/12/2021/1:19:04 PM    Final    CT HEAD CODE STROKE WO CONTRAST  Result Date: 04/11/2021 CLINICAL DATA:  Code stroke. EXAM: CT HEAD WITHOUT CONTRAST TECHNIQUE: Contiguous axial images were obtained from the base of the skull through the vertex without intravenous contrast. COMPARISON:  04/08/2021 FINDINGS: Brain: No acute intracranial hemorrhage, mass effect, or edema. No new loss of gray-white differentiation. Patchy hypoattenuation in the supratentorial white matter likely reflects stable chronic microvascular ischemic changes. There is a stable small calcified meningioma along the right parietal convexity. Ventricles and sulci are stable in size and configuration. No extra-axial collection. Vascular: No hyperdense vessel. Skull: Unremarkable. Sinuses/Orbits: No acute finding. Other: Mastoid air cells are clear. ASPECTS [facility] Stroke Program Early CT Score) -  Ganglionic level infarction (caudate, lentiform nuclei, internal capsule, insula, M1-M3 cortex): 7 - Supraganglionic infarction (M4-M6 cortex): 3 Total score (0-10 with 10 being normal): 10 IMPRESSION: There is no acute intracranial hemorrhage or evidence of acute infarction. ASPECT score is 10. No significant change since recent prior study. These results were communicated to Dr. Leonie Man at 3:22 pm on 04/11/2021 by text page via the [facility] messaging system. Electronically Signed   By: Macy Mis M.D.   On: 04/11/2021 15:26   CT ANGIO HEAD CODE STROKE  Result Date: 04/11/2021 CLINICAL DATA:  Code stroke follow-up EXAM: CT ANGIOGRAPHY HEAD AND NECK CT PERFUSION BRAIN TECHNIQUE: Multidetector CT imaging of the head and neck was performed using the standard protocol during bolus administration of intravenous contrast. Multiplanar CT image reconstructions and MIPs were obtained to evaluate the vascular anatomy. Carotid stenosis measurements (when applicable)  are obtained utilizing NASCET criteria, using the distal internal carotid diameter as the denominator. Multiphase CT imaging of the brain was performed following IV bolus contrast injection. Subsequent parametric perfusion maps were calculated using RAPID software. CONTRAST:  131mL OMNIPAQUE IOHEXOL 350 MG/ML SOLN COMPARISON:  None. FINDINGS: CTA NECK Aortic arch: Calcified and noncalcified plaque along the arch and patent great vessel origins. No high-grade stenosis of the proximal subclavian arteries. Right carotid system: Patent. Noncalcified plaque along the proximal internal carotid causing less than 50% stenosis. Left carotid system: Patent. Mild mixed plaque along the proximal internal carotid causing minimal stenosis. Vertebral arteries: Patent. Right vertebral artery slightly dominant. No stenosis or evidence of dissection. Skeleton: Degenerative changes of the cervical spine. Other neck: Unremarkable. Upper chest: No apical lung mass. Review of the MIP images confirms the above findings CTA HEAD Anterior circulation: Intracranial internal carotid arteries are patent with minor calcified plaque. Anterior and middle cerebral arteries are patent. Posterior circulation: Intracranial vertebral arteries are patent. Basilar artery is patent. Major cerebellar artery origins are patent. Posterior communicating arteries are present. Posterior cerebral arteries are patent. Venous sinuses: Patent as allowed by contrast bolus timing. Review of the MIP images confirms the above findings CT Brain Perfusion Findings: ASPECTS: 10 CBF (<30%) Volume: 48mL Perfusion (Tmax>6.0s) volume: 74mL Mismatch Volume: 48mL Infarction Location: None. IMPRESSION: No large vessel occlusion, hemodynamically significant stenosis, or evidence of dissection. Perfusion imaging demonstrates no evidence of core infarction or penumbra. These results were communicated to Dr. Leonie Man at 3:26 pm on 04/11/2021 by text page via the [facility] messaging system.  Electronically Signed   By: Macy Mis M.D.   On: 04/11/2021 15:36   CT ANGIO NECK CODE STROKE  Result Date: 04/11/2021 CLINICAL DATA:  Code stroke follow-up EXAM: CT ANGIOGRAPHY HEAD AND NECK CT PERFUSION BRAIN TECHNIQUE: Multidetector CT imaging of the head and neck was performed using the standard protocol during bolus administration of intravenous contrast. Multiplanar CT image reconstructions and MIPs were obtained to evaluate the vascular anatomy. Carotid stenosis measurements (when applicable) are obtained utilizing NASCET criteria, using the distal internal carotid diameter as the denominator. Multiphase CT imaging of the brain was performed following IV bolus contrast injection. Subsequent parametric perfusion maps were calculated using RAPID software. CONTRAST:  139mL OMNIPAQUE IOHEXOL 350 MG/ML SOLN COMPARISON:  None. FINDINGS: CTA NECK Aortic arch: Calcified and noncalcified plaque along the arch and patent great vessel origins. No high-grade stenosis of the proximal subclavian arteries. Right carotid system: Patent. Noncalcified plaque along the proximal internal carotid causing less than 50% stenosis. Left carotid system: Patent. Mild mixed plaque along the proximal internal carotid causing  minimal stenosis. Vertebral arteries: Patent. Right vertebral artery slightly dominant. No stenosis or evidence of dissection. Skeleton: Degenerative changes of the cervical spine. Other neck: Unremarkable. Upper chest: No apical lung mass. Review of the MIP images confirms the above findings CTA HEAD Anterior circulation: Intracranial internal carotid arteries are patent with minor calcified plaque. Anterior and middle cerebral arteries are patent. Posterior circulation: Intracranial vertebral arteries are patent. Basilar artery is patent. Major cerebellar artery origins are patent. Posterior communicating arteries are present. Posterior cerebral arteries are patent. Venous sinuses: Patent as allowed by  contrast bolus timing. Review of the MIP images confirms the above findings CT Brain Perfusion Findings: ASPECTS: 10 CBF (<30%) Volume: 41mL Perfusion (Tmax>6.0s) volume: 84mL Mismatch Volume: 64mL Infarction Location: None. IMPRESSION: No large vessel occlusion, hemodynamically significant stenosis, or evidence of dissection. Perfusion imaging demonstrates no evidence of core infarction or penumbra. These results were communicated to Dr. Leonie Man at 3:26 pm on 04/11/2021 by text page via the [facility] messaging system. Electronically Signed   By: Macy Mis M.D.   On: 04/11/2021 15:36        Scheduled Meds:   stroke: mapping our early stages of recovery book   Does not apply Once   Chlorhexidine Gluconate Cloth  6 each Topical Q0600   gabapentin  100 mg Oral BID   levETIRAcetam  500 mg Oral BID   oxybutynin  10 mg Oral QHS   pantoprazole  40 mg Oral Daily   polyethylene glycol  17 g Oral Daily   pyridOXINE  400 mg Oral Q lunch   senna-docusate  1 tablet Oral BID   verapamil  180 mg Oral Daily   Continuous Infusions:  sodium chloride 10 mL/hr at 04/13/21 0600   ertapenem Stopped (04/12/21 1527)     LOS: 4 days     Cordelia Poche, MD [facility] 04/13/2021, 12:20 PM  If 7PM-7AM, please contact night-coverage [email or web address]

## 2021-04-14 LAB — BASIC METABOLIC PANEL
Anion gap: 8 (ref 5–15)
BUN: 10 mg/dL (ref 8–23)
CO2: 23 mmol/L (ref 22–32)
Calcium: 8.8 mg/dL — ABNORMAL LOW (ref 8.9–10.3)
Chloride: 105 mmol/L (ref 98–111)
Creatinine, Ser: 0.68 mg/dL (ref 0.44–1.00)
GFR, Estimated: >60 mL/min (ref >60)
Glucose, Bld: 91 mg/dL (ref 70–99)
Potassium: 3.9 mmol/L (ref 3.5–5.1)
Sodium: 136 mmol/L (ref 135–145)

## 2021-04-14 LAB — CBC WITH DIFFERENTIAL/PLATELET
Abs Immature Granulocytes: 0.01 10*3/uL (ref 0.00–0.07)
Basophils Absolute: 0 10*3/uL (ref 0.0–0.1)
Basophils Relative: 0 %
Eosinophils Absolute: 0.1 10*3/uL (ref 0.0–0.5)
Eosinophils Relative: 3 %
HCT: 29.3 % — ABNORMAL LOW (ref 36.0–46.0)
Hemoglobin: 9.7 g/dL — ABNORMAL LOW (ref 12.0–15.0)
Immature Granulocytes: 0 %
Lymphocytes Relative: 41 %
Lymphs Abs: 2 10*3/uL (ref 0.7–4.0)
MCH: 31.9 pg (ref 26.0–34.0)
MCHC: 33.1 g/dL (ref 30.0–36.0)
MCV: 96.4 fL (ref 80.0–100.0)
Monocytes Absolute: 0.4 10*3/uL (ref 0.1–1.0)
Monocytes Relative: 9 %
Neutro Abs: 2.3 10*3/uL (ref 1.7–7.7)
Neutrophils Relative %: 47 %
Platelets: 194 10*3/uL (ref 150–400)
RBC: 3.04 MIL/uL — ABNORMAL LOW (ref 3.87–5.11)
RDW: 13.2 % (ref 11.5–15.5)
WBC: 4.9 10*3/uL (ref 4.0–10.5)
nRBC: 0 % (ref 0.0–0.2)

## 2021-04-14 LAB — MAGNESIUM: Magnesium: 1.9 mg/dL (ref 1.7–2.4)

## 2021-04-14 MED ORDER — TIZANIDINE HCL 4 MG PO TABS: ORAL_TABLET | ORAL | 0 refills | Status: DC

## 2021-04-14 MED ORDER — LEVETIRACETAM 500 MG PO TABS: 500.0000 mg | ORAL_TABLET | Freq: Two times a day (BID) | ORAL | 2 refills | Status: DC

## 2021-04-14 MED ORDER — FOSFOMYCIN TROMETHAMINE 3 G PO PACK: 3.0000 g | PACK | Freq: Once | ORAL | Status: DC

## 2021-04-14 MED ORDER — CIPROFLOXACIN HCL 500 MG PO TABS
250.0000 mg | ORAL_TABLET | Freq: Two times a day (BID) | ORAL | Status: DC
  Administered 2021-04-14: 250 mg via ORAL
  Filled 2021-04-14: qty 1

## 2021-04-14 MED ORDER — CIPROFLOXACIN HCL 250 MG PO TABS: 250.0000 mg | ORAL_TABLET | Freq: Two times a day (BID) | ORAL | 0 refills | Status: AC

## 2021-04-14 NOTE — Discharge Instructions (Addendum)
Tracey Armstrong,  You are in the hospital because of confusion.  This was initially thought to be secondary to your very elevated blood pressure but you are also found to have a urinary tract infection.  During hospitalization however, you are observed to have seizure type activity.  Stroke work-up was pursued but thankfully there was no evidence for an acute stroke.  You have been started on antiseizure medication called Keppra.  For your urinary tract infection, you have received antibiotics and will discharge with continued antibiotics.  Please follow-up with your primary care physician in addition to the neurologist.  I have included information with regard to your ability to drive per [place] DMV guidelines.  Cordelia Poche, MD [facility]  ---------------------------------------------------------------------------------  Per [facility] statutes, patients with seizures are not allowed to drive until  they have been seizure-free for six months. Use caution when using heavy equipment or power tools. Avoid working on ladders or at heights. Take showers instead of baths. Ensure the water temperature is not too high on the home water heater. Do not go swimming alone. When caring for infants or small children, sit down when holding, feeding, or changing them to minimize risk of injury to the child in the event you have a seizure.    Also, Maintain good sleep hygiene. Avoid alcohol.   --> Call 911 and bring the patient back to the ED if:               A.  The seizure lasts longer than 5 minutes.                  B.  The patient doesn't awaken shortly after the seizure             C.  The patient has new problems such as difficulty seeing, speaking or moving             D.  The patient was injured during the seizure             E.  The patient has a temperature over 102 F (39C)             F.  The patient vomited and now is having trouble breathing

## 2021-04-14 NOTE — TOC Transition Note (Signed)
Transition of Care [facility]) - CM/SW Discharge Note   Patient Details  Name: Tracey Armstrong MRN: [id number] Date of Birth: [birth year]  [facility] CM/SW Contact:  Carles Collet, RN Phone Number: 04/14/2021, 11:47 AM   Clinical Narrative:    Damaris Schooner w patient and family at bedside. They would like patient to continue services at [facility] with [facility]. Orders modified and faxed to Philippa Sicks.  They decline any DME needs, patient has RW and modified bathroom.    Final next level of care: Home/Self Care Barriers to Discharge: No Barriers Identified   Patient Goals and CMS Choice Patient states their goals for this hospitalization and ongoing recovery are:: return to [facility] w PT OT through [facility].gov Compare Post Acute Care list provided to:: Patient Choice offered to / list presented to : Patient, Adult Children  Discharge Placement                       Discharge Plan and Services                DME Arranged: N/A         HH Arranged: OT, PT   Date HH Agency Contacted: 04/14/21 Time [place]: [id number] Representative spoke with at [place]: orders faxed to [facility] at [place] ([place] Interventions     Readmission Risk Interventions No flowsheet data found.

## 2021-04-14 NOTE — Discharge Summary (Signed)
Physician Discharge Summary  Tanikka Bresnan VEL:[id number] DOB: 1934-11-17 DOA: 04/08/2021  PCP: Martinique, [name], MD  Admit date: 04/08/2021 Discharge date: 04/14/2021  Admitted From: ILF Disposition: ILF  Recommendations for Outpatient Follow-up:  Follow up with PCP in 1 week Follow up with neurology in 6 weeks Please follow up on the following pending results: Blood cultures  Home Health: PT, OT Equipment/Devices: None  Discharge Condition: Stable CODE STATUS: DNR Diet recommendation: Regular diet   Brief/Interim Summary:  Admission HPI written by Vernelle Emerald, MD   HPI:     86 year old female with past medical history of depression, gastroesophageal reflux disease, anemia, hypertension and chronic pain syndrome who presents to [facility] emergency department via EMS from her independent living facility due to concerns for confusion.   Patient is an extremely poor historian and therefore majority the history has been obtained from the son via phone conversation.   Since I explains that patient has no history of dementia or memory issues at baseline.  Patient is typically AAO x3.  Son explains that approximately 1 weeks ago, patient was beginning to experience lightheadedness and weakness.   Son denies any knowledge of fevers nausea vomiting dysuria cough shortness of breath.  Patient presented to the [facility] emergency department for evaluation where the patient was found to be hypotensive.  Patient underwent a basic work-up for infection which was found to be negative.  Patient was found to be COVID PCR positive but it was determined at that time that the patient was actually found to be positive at her independent living facility 2 weeks prior and it was felt that the current positive test on 6/14 was not clinically relevant.  Patient was given intravenous fluids with substantial improvement in her blood pressures.  Patient was discharged home.     Son reports  that since that emergency room visit the patient is upon herself to discontinue all of her antihypertensives.    Now in the past several days the patient has been exhibiting confusion and disorientation at the facility.  Because of this increasing confusion and disorientation EMS was contacted and the patient was eventually brought into [facility] emergency department for evaluation.   Upon evaluation in the emergency department patient was found to have a mild lactic acidosis of 2.2.  Urinalysis was found to be nitrate positive in the emergency department staff initiated intravenous ceftriaxone.  Patient was found to be markedly hypertensive throughout the emergency department stay and was administered a dose of intravenous labetalol.  The hospitalist group was then called to assess the patient for admission to the hospital.   Hospital course:  Acute metabolic encephalopathy Initially thought secondary to COVID infection, UTI, medications. On 6/23, patient was noticed to have new aphasia with left-sided gaze preference with initial concern for acute CVA.  Neurology was consulted. She started on tPA on 6/23 and moved to the ICU where she was found to have evidence of status epilepticus.  She was started on Keppra with improvement.    Fever Thought to be secondary to seizures with initial concern for possible encephalitis.  MRI without features consistent with encephalitis.  No associated leukocytosis.  T-max of 101.3 F with no recurrence.  Afebrile for the last 48 hours. Blood cultures with no growth to date  Seizures New onset. As mentioned above. Started on Keppra. Discussed driving restrictions. Neurology follow-up as an outpatient.   Primary hypertension -Continue verapamil 258 mg   UTI Complicated  in setting of fever. ESBL Klebsiella and E. Coli on urine culture.  Empirically started on ceftriaxone and transition to ertapenem on 6/24 for treatment. She received 2 doses of  Ertapenem and was transitioned fo ciprofloxacin for discharge per ID recommendations.   COVID-19 infection Incidental finding.  Patient is fully vaccinated.  No symptoms.  Diagnosed on 6/14 and was on isolation for 10 days.  Isolation discontinued.   Meningioma Incidental on MRI.  Stable.  Outpatient follow-up  GERD Continue Protonix   Chronic back pain Continue Neurontin and Zanaflex   Anxiety Continue Xanax as needed   Constipation Treated with MiraLAX and senna in addition to Dulcolax  Discharge Diagnoses:  Principal Problem:   Acute metabolic encephalopathy Active Problems:   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HCC)   GERD without esophagitis   Hypertensive urgency   Lactic acidosis   Lab test positive for detection of COVID-19 virus   Abnormal urinalysis   Urinary tract infection due to ESBL Klebsiella    Discharge Instructions  Discharge Instructions     Ambulatory referral to Neurology   Complete by: As directed    Follow up with Dr. Leonie Man at [facility] in 4-6 weeks. Thanks.      Allergies as of 04/14/2021       Reactions   Dihydrotachysterol Other (See Comments)   Dihydrotachysterol is a form of vitamin D. Might be "DHT Intensol" (Name Brand)- Reaction unknown- tolerating 50,000 units of Vitamin D-2 (Drisdol) in 2022   Trazodone And Nefazodone Other (See Comments)   Worsens headaches   Valium [diazepam] Other (See Comments)   headaches   Citalopram Other (See Comments)   Other reaction(s): Headache   Other Other (See Comments)   "States all antidepressants cause migraines." - tolerates Xanax         Medication List     TAKE these medications    acetaminophen 650 MG CR tablet Commonly known as: TYLENOL Take 975 mg by mouth daily.   ALPRAZolam 1 MG tablet Commonly known as: XANAX TAKE 1/2 TABLET BY MOUTH AT NOON AND 1 TABLET AT BEDTIME What changed: See the new instructions.   antiseptic oral rinse Liqd Use 3 mls 3-4 times per day for dry mouth. Do  not swallow.   ARTIFICIAL TEARS PF OP Place 1-2 drops into both eyes See admin instructions. Instill 1-2 drops into both eyes at bedtime and an additional 2-3 times a day as needed for dryness   ciprofloxacin 250 MG tablet Commonly known as: CIPRO Take 1 tablet (250 mg total) by mouth 2 (two) times daily for 5 days.   diphenhydramine-acetaminophen 25-500 MG Tabs tablet Commonly known as: TYLENOL PM Take 1.5 tablets by mouth at bedtime.   docusate sodium 100 MG capsule Commonly known as: COLACE Take 1 capsule (100 mg total) by mouth 2 (two) times daily.   gabapentin 300 MG capsule Commonly known as: NEURONTIN Take 1 capsule (300 mg total) by mouth 2 (two) times daily.   levETIRAcetam 500 MG tablet Commonly known as: KEPPRA Take 1 tablet (500 mg total) by mouth 2 (two) times daily.   Magnesium 400 MG Tabs Take 400 mg by mouth at bedtime.   Maxalt-MLT 10 MG disintegrating tablet Generic drug: rizatriptan DISSOLVE ONE TABLET BY MOUTH AS NEEDED HEADACHE What changed: See the new instructions.   Melatonin 10 MG Tabs Take 10 mg by mouth at bedtime as needed (for sleep).   omeprazole 20 MG tablet Commonly known as: PRILOSEC OTC Take 20 mg by mouth  daily before breakfast.   oxybutynin 10 MG 24 hr tablet Commonly known as: DITROPAN-XL TAKE 1 TABLET(10 MG) BY MOUTH AT BEDTIME What changed: See the new instructions.   POTASSIUM PO Take 1 tablet by mouth daily with lunch.   pyridOXINE 100 MG tablet Commonly known as: VITAMIN B-6 Take 400 mg by mouth daily with lunch.   riboflavin 100 MG Tabs tablet Commonly known as: VITAMIN B-2 Take 400 mg by mouth daily with lunch.   senna 8.6 MG Tabs tablet Commonly known as: SENOKOT Take 1 tablet (8.6 mg total) by mouth 2 (two) times daily. What changed: when to take this   tiZANidine 4 MG tablet Commonly known as: ZANAFLEX Hold while taking Ciprofloxacin What changed: See the new instructions.   tretinoin 0.1 %  cream Commonly known as: RETIN-A APPLY TO AFFECTED AREA EVERY DAY AT BEDTIME What changed: See the new instructions.   verapamil 180 MG 24 hr capsule Commonly known as: VERELAN PM TAKE 1 CAPSULE(180 MG) BY MOUTH DAILY What changed: See the new instructions.   VITAMIN B-12 PO Take 1 tablet by mouth daily with lunch.   Vitamin D (Ergocalciferol) 1.25 MG (50000 UNIT) Caps capsule Commonly known as: DRISDOL TAKE 1 CAPSULE BY MOUTH EVERY 2 WEEKS What changed:  how much to take how to take this when to take this additional instructions        Follow-up Information     Garvin Fila, MD Follow up in 6 day(s).   Specialties: Neurology, Radiology Why: stroke clinic Contact information: [address] [place] [address] [phone]         Martinique, [name], MD. Schedule an appointment as soon as possible for a visit in 1 week(s).   Specialty: Family Medicine Why: For hospital follow-up Contact information: [address] [place] [address] [phone]                Allergies  Allergen Reactions   Dihydrotachysterol Other (See Comments)    Dihydrotachysterol is a form of vitamin D. Might be "DHT Intensol" (Name Brand)- Reaction unknown- tolerating 50,000 units of Vitamin D-2 (Drisdol) in 2022   Trazodone And Nefazodone Other (See Comments)    Worsens headaches   Valium [Diazepam] Other (See Comments)    headaches   Citalopram Other (See Comments)    Other reaction(s): Headache   Other Other (See Comments)    "States all antidepressants cause migraines." - tolerates Xanax     Consultations: Neurology   Procedures/Studies: CT HEAD WO CONTRAST  Result Date: 04/12/2021 CLINICAL DATA:  Stroke follow-up. EXAM: CT HEAD WITHOUT CONTRAST TECHNIQUE: Contiguous axial images were obtained from the base of the skull through the vertex without intravenous contrast. COMPARISON:  CT head May 11, 2021. FINDINGS: Brain: No evidence of acute  large vascular territory infarction, hemorrhage, hydrocephalus, extra-axial collection or mass lesion/mass effect. Similar patchy white matter hypoattenuation, compatible with chronic microvascular ischemic disease. Similar mild atrophy with ex vacuo ventricular dilation. Vascular: No hyperdense vessel identified. Calcified atherosclerosis. Skull: No acute fracture. Sinuses/Orbits: Opacified right posterior ethmoid air cell. Maxillary torus. No acute or orbital abnormality. Other: No mastoid effusions. IMPRESSION: 1. No evidence of acute intracranial abnormality. 2. Chronic microvascular disease and atrophy. Electronically Signed   By: Margaretha Sheffield MD   On: 04/12/2021 15:36   CT HEAD WO CONTRAST  Result Date: 04/11/2021 CLINICAL DATA:  Altered mental status EXAM: CT HEAD WITHOUT CONTRAST TECHNIQUE: Contiguous axial images were obtained from the base  of the skull through the vertex without intravenous contrast. COMPARISON:  CT from earlier in the same day. FINDINGS: Brain: No evidence of acute infarction, hemorrhage, hydrocephalus, extra-axial collection or mass lesion/mass effect. Chronic atrophic and ischemic changes are again identified similar to that seen on prior exam. Calcified right parietal meningioma is again seen and stable. Residual contrast is noted from prior CTA of the head Vascular: No hyperdense vessel or unexpected calcification. Skull: Normal. Negative for fracture or focal lesion. Sinuses/Orbits: No acute finding. Other: None. IMPRESSION: Chronic atrophic and ischemic changes without acute abnormality. Electronically Signed   By: Inez Catalina M.D.   On: 04/11/2021 17:58   CT Head Wo Contrast  Result Date: 04/08/2021 CLINICAL DATA:  Mental status change EXAM: CT HEAD WITHOUT CONTRAST TECHNIQUE: Contiguous axial images were obtained from the base of the skull through the vertex without intravenous contrast. COMPARISON:  CT brain 06/02/2020 FINDINGS: Brain: No acute territorial  infarction, hemorrhage or new intracranial mass. Right parietal convexity calcified meningioma. Mild atrophy. Mild to moderate chronic small vessel ischemic change of the white matter. Stable ventricle size. Vascular: No hyperdense vessels.  Carotid vascular calcification Skull: Normal. Negative for fracture or focal lesion. Sinuses/Orbits: Patchy mucosal disease in the ethmoid sinus Other: None IMPRESSION: 1. No CT evidence for acute intracranial abnormality. 2. Atrophy and chronic small vessel ischemic change of the white matter Electronically Signed   By: Donavan Foil M.D.   On: 04/08/2021 19:05   MR BRAIN WO CONTRAST  Result Date: 04/13/2021 CLINICAL DATA:  Follow-up examination for stroke. EXAM: MRI HEAD WITHOUT CONTRAST TECHNIQUE: Multiplanar, multiecho pulse sequences of the brain and surrounding structures were obtained without intravenous contrast. COMPARISON:  Prior CTs from earlier the same day as well as from 04/11/2021. FINDINGS: Brain: Mild age-related cerebral atrophy with chronic small vessel ischemic disease. No evidence for acute or interval infarction. Gray-white matter differentiation maintained. No evidence for acute or chronic intracranial hemorrhage. 1 cm calcified meningioma overlying the right parietal convexity again noted. No associated mass effect. No other mass lesion, mass effect, or midline shift. No hydrocephalus or extra-axial fluid collection. Pituitary gland suprasellar region normal. Midline structures intact. Vascular: Major intracranial vascular flow voids are maintained. Skull and upper cervical spine: Craniocervical junction normal. Bone marrow signal intensity normal. No scalp soft tissue abnormality. Sinuses/Orbits: Prior bilateral ocular lens replacement. Scattered mucosal thickening within the ethmoidal air cells. No mastoid effusion. Other: None. IMPRESSION: 1. Stable brain MRI. No acute intracranial infarct or other abnormality. 2. Atrophy with chronic microvascular  ischemic disease. 3. 1 cm right parietal convexity meningioma. Electronically Signed   By: Jeannine Boga M.D.   On: 04/13/2021 04:53   MR BRAIN WO CONTRAST  Result Date: 04/09/2021 CLINICAL DATA:  Initial evaluation for acute mental status change. EXAM: MRI HEAD WITHOUT CONTRAST TECHNIQUE: Multiplanar, multiecho pulse sequences of the brain and surrounding structures were obtained without intravenous contrast. COMPARISON:  Prior head CT from 04/08/2021. FINDINGS: Brain: Cerebral volume within normal limits for age. Patchy T2/FLAIR hyperintensity within the periventricular and deep white matter both cerebral hemispheres most consistent with chronic small vessel ischemic disease, mild for age. No abnormal foci of restricted diffusion to suggest acute or subacute ischemia. Gray-white matter differentiation maintained. No encephalomalacia to suggest chronic cortical infarction. No evidence for acute or chronic intracranial hemorrhage. 1 cm calcified meningioma overlying the right parietal convexity again noted, relatively stable from previous. No associated mass effect. No other mass lesion, midline shift or mass effect. No hydrocephalus or  extra-axial fluid collection. Pituitary gland suprasellar region within normal limits. Midline structures intact. Vascular: Major intracranial vascular flow voids are maintained. Skull and upper cervical spine: Craniocervical junction within normal limits. Bone marrow signal intensity normal. No scalp soft tissue abnormality. Sinuses/Orbits: Patient status post bilateral ocular lens replacement. Globes and orbital soft tissues demonstrate no acute finding. Scattered mucosal thickening noted within the ethmoidal air cells. Paranasal sinuses are otherwise largely clear. No mastoid effusion. Inner ear structures grossly normal. Other: None. IMPRESSION: 1. No acute intracranial abnormality. 2. Mild age-related cerebral atrophy with chronic microvascular ischemic disease. 3. 1  cm calcified meningioma overlying the right parietal convexity without associated mass effect, stable. Electronically Signed   By: Jeannine Boga M.D.   On: 04/09/2021 01:38   CT ABDOMEN PELVIS W CONTRAST  Result Date: 04/09/2021 CLINICAL DATA:  Abdominal pain, acute, nonlocalized, concern for UTI EXAM: CT ABDOMEN AND PELVIS WITH CONTRAST TECHNIQUE: Multidetector CT imaging of the abdomen and pelvis was performed using the standard protocol following bolus administration of intravenous contrast. CONTRAST:  30mL OMNIPAQUE IOHEXOL 300 MG/ML  SOLN COMPARISON:  CT lumbar spine April 04, 2020. FINDINGS: Lower chest: Bibasilar atelectasis. Coronary artery calcifications. Normal size heart. No significant pericardial effusion/thickening. Moderate hiatal hernia. Hepatobiliary: No suspicious hepatic lesion. Subcentimeter hypodense lesion in the posterior right hepatic lobe on image 20/3 too small to accurately characterize but favored represent cysts. Gallbladder is unremarkable. No biliary ductal dilation. Pancreas: Within normal limits. Spleen: Normal size spleen. 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 Adrenals/Urinary Tract: Mild bilateral adrenal thickening, favor hyperplasia. No hydronephrosis. Although evaluation is slightly limited by respiratory motion there appear to be 2 focal areas of relative hypoenhancement of the right renal parenchyma for involving the lower pole on image 56/6 and upper pole on image 60/6. There is some right-sided urothelial hyperenhancement and adjacent stranding involving the renal pelvis. No solid enhancing renal lesions. Mild thickening of the urinary bladder Stomach/Bowel: Moderate-sized hiatal hernia otherwise the stomach is grossly unremarkable. No pathologic dilation of small bowel. The appendix and terminal ileum are grossly  unremarkable. Sigmoid colonic diverticulosis without findings of acute diverticulitis. Rectum is distended with a moderate volume of stool with rectal wall thickening and adjacent inflammatory stranding Vascular/Lymphatic: Aortic atherosclerosis without aneurysmal dilation. Prominent periportal lymph nodes measuring up to 6 mm. No pathologically enlarged abdominal or pelvic lymph nodes. Reproductive: Uterus and bilateral adnexa are unremarkable. Other: No abdominopelvic ascites. Musculoskeletal: Diffuse demineralization of bone. Increased height loss involving the L1 vertebral body compression deformity now with near complete vertebral plana. T9, L3 and L4 vertebral body hemangiomas. Diffuse demineralization of bone. Multilevel degenerative changes spine. Partially visualized left femoral intramedullary fixation rod. IMPRESSION: 1. Mild thickening of the urinary bladder wall which may represent cystitis. Additionally although evaluation is slightly limited by respiratory motion there 2 apparent areas of relative hypoenhancement of the right renal parenchyma with some urothelial hyperenhancement and adjacent inflammatory stranding of the renal pelvis, findings which may reflect pyelonephritis. 2. Rectum is distended with a moderate volume of stool and rectal wall thickening with adjacent inflammatory stranding, suggestive of stercoral colitis. 3. Increased height loss involving the chronic L1 vertebral body compression deformity now with near complete vertebral plana. 4. Moderate-sized hiatal hernia. 5. Sigmoid colonic diverticulosis without findings of acute diverticulitis. 6. Aortic atherosclerosis. Aortic Atherosclerosis (ICD10-I70.0). Electronically Signed   By: Dahlia Bailiff MD  On: 04/09/2021 09:16   CT CEREBRAL PERFUSION W CONTRAST  Result Date: 04/11/2021 CLINICAL DATA:  Code stroke follow-up EXAM: CT ANGIOGRAPHY HEAD AND NECK CT PERFUSION BRAIN TECHNIQUE: Multidetector CT imaging of the head and neck  was performed using the standard protocol during bolus administration of intravenous contrast. Multiplanar CT image reconstructions and MIPs were obtained to evaluate the vascular anatomy. Carotid stenosis measurements (when applicable) are obtained utilizing NASCET criteria, using the distal internal carotid diameter as the denominator. Multiphase CT imaging of the brain was performed following IV bolus contrast injection. Subsequent parametric perfusion maps were calculated using RAPID software. CONTRAST:  138mL OMNIPAQUE IOHEXOL 350 MG/ML SOLN COMPARISON:  None. FINDINGS: CTA NECK Aortic arch: Calcified and noncalcified plaque along the arch and patent great vessel origins. No high-grade stenosis of the proximal subclavian arteries. Right carotid system: Patent. Noncalcified plaque along the proximal internal carotid causing less than 50% stenosis. Left carotid system: Patent. Mild mixed plaque along the proximal internal carotid causing minimal stenosis. Vertebral arteries: Patent. Right vertebral artery slightly dominant. No stenosis or evidence of dissection. Skeleton: Degenerative changes of the cervical spine. Other neck: Unremarkable. Upper chest: No apical lung mass. Review of the MIP images confirms the above findings CTA HEAD Anterior circulation: Intracranial internal carotid arteries are patent with minor calcified plaque. Anterior and middle cerebral arteries are patent. Posterior circulation: Intracranial vertebral arteries are patent. Basilar artery is patent. Major cerebellar artery origins are patent. Posterior communicating arteries are present. Posterior cerebral arteries are patent. Venous sinuses: Patent as allowed by contrast bolus timing. Review of the MIP images confirms the above findings CT Brain Perfusion Findings: ASPECTS: 10 CBF (<30%) Volume: 77mL Perfusion (Tmax>6.0s) volume: 78mL Mismatch Volume: 27mL Infarction Location: None. IMPRESSION: No large vessel occlusion, hemodynamically  significant stenosis, or evidence of dissection. Perfusion imaging demonstrates no evidence of core infarction or penumbra. These results were communicated to Dr. Leonie Man at 3:26 pm on 04/11/2021 by text page via the [facility] messaging system. Electronically Signed   By: Macy Mis M.D.   On: 04/11/2021 15:36   DG Chest Port 1 View  Result Date: 04/08/2021 CLINICAL DATA:  Pt arrives via [facility] from [place] c/o AMS and possible UTI. Pt has no hx of memory issues EXAM: PORTABLE CHEST 1 VIEW COMPARISON:  04/02/2021. FINDINGS: Cardiac silhouette partly obscured but grossly normal in size. No mediastinal or hilar masses. Bilateral prominent interstitial markings. Additional opacity at the left lung base is noted consistent with atelectasis with a possible small effusion. Lungs otherwise clear. No convincing right pleural effusion and no pneumothorax. Skeletal structures are grossly intact. IMPRESSION: No acute cardiopulmonary disease. Electronically Signed   By: Lajean Manes M.D.   On: 04/08/2021 18:19   DG Chest Portable 1 View  Result Date: 04/02/2021 CLINICAL DATA:  Hypotension and dizziness EXAM: PORTABLE CHEST 1 VIEW COMPARISON:  June 02, 2020 FINDINGS: There is slight left base atelectasis. Lungs elsewhere are clear. Heart size and pulmonary vascularity are normal. No adenopathy. There is aortic atherosclerosis. No bone lesions. IMPRESSION: Left base atelectasis. No edema or airspace opacity. Heart size normal. Aortic Atherosclerosis (ICD10-I70.0). Electronically Signed   By: Lowella Grip III M.D.   On: 04/02/2021 15:58   DG Chest Port 1V same Day  Result Date: 04/12/2021 CLINICAL DATA:  Shortness of breath. EXAM: PORTABLE CHEST 1 VIEW COMPARISON:  04/08/2021 FINDINGS: Single view of the chest was obtained. Mild blunting at the left costophrenic angle but minimal change. Otherwise, the lungs are clear.  Heart and mediastinum are within normal limits. Evidence for leads  overlying the left upper chest. Negative for pneumothorax. IMPRESSION: 1. No acute chest abnormality. 2. Mild blunting at the left costophrenic angle. Findings could be related to scarring or minimal pleural fluid. Electronically Signed   By: Markus Daft M.D.   On: 04/12/2021 11:00   EEG adult  Result Date: 04/11/2021 Lora Havens, MD     04/11/2021  5:21 PM Patient Name: Ziyana Morikawa MRN: [id number] Epilepsy Attending: Lora Havens Referring Physician/Provider: Anibal Henderson, NP Date: 04/11/2021 Duration: 20.25 mins Patient history: 86yo F with sudden worsening of mental status. EEG to evaluate for seizure Level of alertness: Awake AEDs during EEG study: None Technical aspects: This EEG study was done with scalp electrodes positioned according to the 10-20 International system of electrode placement. Electrical activity was acquired at a sampling rate of 500Hz  and reviewed with a high frequency filter of 70Hz  and a low frequency filter of 1Hz . EEG data were recorded continuously and digitally stored. Description: No posterior dominant rhythm was seen. EEG initially showed generalized, maximal bifrontal periodic epileptiform  discharges at 3hz  consistent with convulsive status epilepticus. On video, no clnical signs were noted. EEG spontaneously improved after 1645. EEG then showed continuous generalized 3 to 6 Hz theta-delta slowing. Iv ativan 2mg  was administered at 1648 after which eeg showed generalized and lateralized right hemisphere 3-6hz  theta-delta slowing admixed with 15-18hz  generalized beta activity. Hyperventilation and photic stimulation were not performed.   ABNORMALITY - Non convulsive status epilepticus, generalized - Continuous slow, generalized - Excessive beta, generalized IMPRESSION: This study was initially suggestive of non convulsive status epilepticus which improved after 1645. EEG was then suggestive of cortical dysfunction arising from right hemisphere, likely secondary to  underlying structural abnormality, post-ictal state. Additionally there is moderate diffuse encephalopathy, nonspecific etiology but likely related to sedation, seizure. Dr Lorrin Goodell was notified. Priyanka Barbra Sarks   Overnight EEG with video  Result Date: 04/12/2021 Lora Havens, MD     04/12/2021  2:26 PM Patient Name: Aviyanna Colbaugh MRN: [id number] Epilepsy Attending: Lora Havens Referring Physician/Provider: Dr Antony Contras Duration: 04/11/2021 1721 to 04/12/2021 1354  Patient history: 86yo F with sudden worsening of mental status. EEG to evaluate for seizure  Level of alertness: Awake, asleep  AEDs during EEG study: LEV, GBP  Technical aspects: This EEG study was done with scalp electrodes positioned according to the 10-20 International system of electrode placement. Electrical activity was acquired at a sampling rate of 500Hz  and reviewed with a high frequency filter of 70Hz  and a low frequency filter of 1Hz . EEG data were recorded continuously and digitally stored.  Description: No posterior dominant rhythm was seen.  Sleep was characterized by sleep spindles (12 to 14 Hz), maximal frontocentral region.  EEG showed intermittent generalized and lateralized right hemisphere 3-6hz  theta-delta slowing. Hyperventilation and photic stimulation were not performed.    ABNORMALITY -Intermittent slow, generalized and lateralized right hemisphere  IMPRESSION: This study is suggestive of non specific cortical dysfunction arising from right hemisphere. Additionally there is mild to moderate diffuse encephalopathy, nonspecific etiology but likely related to sedation, seizure. EEG appears to be improving compared to previous day.  Lora Havens   ECHOCARDIOGRAM COMPLETE  Result Date: 04/12/2021    ECHOCARDIOGRAM REPORT   Patient Name:   Aspen Willet Date of Exam: 04/12/2021 Medical Rec #:  [id number]     Height:       62.0 in Accession #:  [id number]    Weight:       128.7 lb Date of Birth:  [birth year]       BSA:          1.585 m Patient Age:    86 years      BP:           147/79 mmHg Patient Gender: F             HR:           93 bpm. Exam Location:  Inpatient Procedure: 2D Echo, 3D Echo, Cardiac Doppler and Color Doppler Indications:    Stroke.  History:        Patient has prior history of Echocardiogram examinations, most                 recent 11/09/2015. CAD; Risk Factors:Hypertension and                 Dyslipidemia.  Sonographer:    Roseanna Rainbow RDCS Referring Phys: [id number] [place]  Sonographer Comments: Technically difficult study due to poor echo windows. Chest wall deformity. IMPRESSIONS  1. Left ventricular ejection fraction, by estimation, is 60 to 65%. The left ventricle has normal function. The left ventricle has no regional wall motion abnormalities. There is mild left ventricular hypertrophy of the basal-septal segment. Left ventricular diastolic parameters are consistent with Grade I diastolic dysfunction (impaired relaxation).  2. Right ventricular systolic function is normal. The right ventricular size is normal.  3. The mitral valve is normal in structure. Trivial mitral valve regurgitation. No evidence of mitral stenosis.  4. The aortic valve is tricuspid. Aortic valve regurgitation is not visualized. Mild aortic valve sclerosis is present, with no evidence of aortic valve stenosis.  5. The inferior vena cava is normal in size with greater than 50% respiratory variability, suggesting right atrial pressure of 3 mmHg. FINDINGS  Left Ventricle: Left ventricular ejection fraction, by estimation, is 60 to 65%. The left ventricle has normal function. The left ventricle has no regional wall motion abnormalities. The left ventricular internal cavity size was normal in size. There is  mild left ventricular hypertrophy of the basal-septal segment. Left ventricular diastolic parameters are consistent with Grade I diastolic dysfunction (impaired relaxation). Indeterminate filling pressures. Right  Ventricle: The right ventricular size is normal. Right ventricular systolic function is normal. Left Atrium: Left atrial size was normal in size. Right Atrium: Right atrial size was normal in size. Pericardium: There is no evidence of pericardial effusion. Mitral Valve: The mitral valve is normal in structure. Mild mitral annular calcification. Trivial mitral valve regurgitation. No evidence of mitral valve stenosis. Tricuspid Valve: The tricuspid valve is normal in structure. Tricuspid valve regurgitation is trivial. No evidence of tricuspid stenosis. Aortic Valve: The aortic valve is tricuspid. Aortic valve regurgitation is not visualized. Mild aortic valve sclerosis is present, with no evidence of aortic valve stenosis. Pulmonic Valve: The pulmonic valve was normal in structure. Pulmonic valve regurgitation is not visualized. No evidence of pulmonic stenosis. Aorta: The aortic root is normal in size and structure. Venous: The inferior vena cava is normal in size with greater than 50% respiratory variability, suggesting right atrial pressure of 3 mmHg. IAS/Shunts: No atrial level shunt detected by color flow Doppler.  LEFT VENTRICLE PLAX 2D LVIDd:         4.20 cm     Diastology LVIDs:         2.40 cm     LV e' medial:  5.98 cm/s LV PW:         0.90 cm     LV E/e' medial:  13.7 LV IVS:        1.20 cm     LV e' lateral:   7.40 cm/s LVOT diam:     1.90 cm     LV E/e' lateral: 11.1 LV SV:         71 LV SV Index:   45 LVOT Area:     2.84 cm  LV Volumes (MOD) LV vol d, MOD A2C: 54.9 ml LV vol d, MOD A4C: 43.3 ml LV vol s, MOD A2C: 24.6 ml LV vol s, MOD A4C: 19.8 ml LV SV MOD A2C:     30.3 ml LV SV MOD A4C:     43.3 ml LV SV MOD BP:      29.6 ml RIGHT VENTRICLE             IVC RV S prime:     17.60 cm/s  IVC diam: 2.00 cm TAPSE (M-mode): 1.9 cm LEFT ATRIUM             Index       RIGHT ATRIUM           Index LA diam:        3.70 cm 2.33 cm/m  RA Area:     11.20 cm LA Vol (A2C):   36.3 ml 22.90 ml/m RA Volume:    23.10 ml  14.57 ml/m LA Vol (A4C):   37.1 ml 23.40 ml/m LA Biplane Vol: 37.4 ml 23.59 ml/m  AORTIC VALVE LVOT Vmax:   132.00 cm/s LVOT Vmean:  91.100 cm/s LVOT VTI:    0.249 m  AORTA Ao Root diam: 2.90 cm Ao Asc diam:  3.10 cm MITRAL VALVE MV Area (PHT): 3.53 cm     SHUNTS MV Decel Time: 215 msec     Systemic VTI:  0.25 m MV E velocity: 82.10 cm/s   Systemic Diam: 1.90 cm MV A velocity: 130.00 cm/s MV E/A ratio:  0.63 Kirk Ruths MD Electronically signed by Kirk Ruths MD Signature Date/Time: 04/12/2021/1:19:04 PM    Final    CT HEAD CODE STROKE WO CONTRAST  Result Date: 04/11/2021 CLINICAL DATA:  Code stroke. EXAM: CT HEAD WITHOUT CONTRAST TECHNIQUE: Contiguous axial images were obtained from the base of the skull through the vertex without intravenous contrast. COMPARISON:  04/08/2021 FINDINGS: Brain: No acute intracranial hemorrhage, mass effect, or edema. No new loss of gray-white differentiation. Patchy hypoattenuation in the supratentorial white matter likely reflects stable chronic microvascular ischemic changes. There is a stable small calcified meningioma along the right parietal convexity. Ventricles and sulci are stable in size and configuration. No extra-axial collection. Vascular: No hyperdense vessel. Skull: Unremarkable. Sinuses/Orbits: No acute finding. Other: Mastoid air cells are clear. ASPECTS ([place]yles Stroke Program Early CT Score) - Ganglionic level infarction (caudate, lentiform nuclei, internal capsule, insula, M1-M3 cortex): 7 - Supraganglionic infarction (M4-M6 cortex): 3 Total score (0-10 with 10 being normal): 10 IMPRESSION: There is no acute intracranial hemorrhage or evidence of acute infarction. ASPECT score is 10. No significant change since recent prior study. These results were communicated to Dr. Leonie Man at 3:22 pm on 04/11/2021 by text page via the [facility] messaging system. Electronically Signed   By: Macy Mis M.D.   On: 04/11/2021 15:26   CT ANGIO HEAD CODE  STROKE  Result Date: 04/11/2021 CLINICAL DATA:  Code stroke follow-up EXAM: CT ANGIOGRAPHY HEAD AND NECK  CT PERFUSION BRAIN TECHNIQUE: Multidetector CT imaging of the head and neck was performed using the standard protocol during bolus administration of intravenous contrast. Multiplanar CT image reconstructions and MIPs were obtained to evaluate the vascular anatomy. Carotid stenosis measurements (when applicable) are obtained utilizing NASCET criteria, using the distal internal carotid diameter as the denominator. Multiphase CT imaging of the brain was performed following IV bolus contrast injection. Subsequent parametric perfusion maps were calculated using RAPID software. CONTRAST:  181mL OMNIPAQUE IOHEXOL 350 MG/ML SOLN COMPARISON:  None. FINDINGS: CTA NECK Aortic arch: Calcified and noncalcified plaque along the arch and patent great vessel origins. No high-grade stenosis of the proximal subclavian arteries. Right carotid system: Patent. Noncalcified plaque along the proximal internal carotid causing less than 50% stenosis. Left carotid system: Patent. Mild mixed plaque along the proximal internal carotid causing minimal stenosis. Vertebral arteries: Patent. Right vertebral artery slightly dominant. No stenosis or evidence of dissection. Skeleton: Degenerative changes of the cervical spine. Other neck: Unremarkable. Upper chest: No apical lung mass. Review of the MIP images confirms the above findings CTA HEAD Anterior circulation: Intracranial internal carotid arteries are patent with minor calcified plaque. Anterior and middle cerebral arteries are patent. Posterior circulation: Intracranial vertebral arteries are patent. Basilar artery is patent. Major cerebellar artery origins are patent. Posterior communicating arteries are present. Posterior cerebral arteries are patent. Venous sinuses: Patent as allowed by contrast bolus timing. Review of the MIP images confirms the above findings CT Brain Perfusion  Findings: ASPECTS: 10 CBF (<30%) Volume: 26mL Perfusion (Tmax>6.0s) volume: 39mL Mismatch Volume: 74mL Infarction Location: None. IMPRESSION: No large vessel occlusion, hemodynamically significant stenosis, or evidence of dissection. Perfusion imaging demonstrates no evidence of core infarction or penumbra. These results were communicated to Dr. Leonie Man at 3:26 pm on 04/11/2021 by text page via the [facility] messaging system. Electronically Signed   By: Macy Mis M.D.   On: 04/11/2021 15:36   CT ANGIO NECK CODE STROKE  Result Date: 04/11/2021 CLINICAL DATA:  Code stroke follow-up EXAM: CT ANGIOGRAPHY HEAD AND NECK CT PERFUSION BRAIN TECHNIQUE: Multidetector CT imaging of the head and neck was performed using the standard protocol during bolus administration of intravenous contrast. Multiplanar CT image reconstructions and MIPs were obtained to evaluate the vascular anatomy. Carotid stenosis measurements (when applicable) are obtained utilizing NASCET criteria, using the distal internal carotid diameter as the denominator. Multiphase CT imaging of the brain was performed following IV bolus contrast injection. Subsequent parametric perfusion maps were calculated using RAPID software. CONTRAST:  171mL OMNIPAQUE IOHEXOL 350 MG/ML SOLN COMPARISON:  None. FINDINGS: CTA NECK Aortic arch: Calcified and noncalcified plaque along the arch and patent great vessel origins. No high-grade stenosis of the proximal subclavian arteries. Right carotid system: Patent. Noncalcified plaque along the proximal internal carotid causing less than 50% stenosis. Left carotid system: Patent. Mild mixed plaque along the proximal internal carotid causing minimal stenosis. Vertebral arteries: Patent. Right vertebral artery slightly dominant. No stenosis or evidence of dissection. Skeleton: Degenerative changes of the cervical spine. Other neck: Unremarkable. Upper chest: No apical lung mass. Review of the MIP images confirms the above findings CTA  HEAD Anterior circulation: Intracranial internal carotid arteries are patent with minor calcified plaque. Anterior and middle cerebral arteries are patent. Posterior circulation: Intracranial vertebral arteries are patent. Basilar artery is patent. Major cerebellar artery origins are patent. Posterior communicating arteries are present. Posterior cerebral arteries are patent. Venous sinuses: Patent as allowed by contrast bolus timing. Review of the MIP images confirms the above  findings CT Brain Perfusion Findings: ASPECTS: 10 CBF (<30%) Volume: 74mL Perfusion (Tmax>6.0s) volume: 42mL Mismatch Volume: 4mL Infarction Location: None. IMPRESSION: No large vessel occlusion, hemodynamically significant stenosis, or evidence of dissection. Perfusion imaging demonstrates no evidence of core infarction or penumbra. These results were communicated to Dr. Leonie Man at 3:26 pm on 04/11/2021 by text page via the [facility] messaging system. Electronically Signed   By: Macy Mis M.D.   On: 04/11/2021 15:36    EEG (6/23)  IMPRESSION: This study was initially suggestive of non convulsive status epilepticus which improved after 1645. EEG was then suggestive of cortical dysfunction arising from right hemisphere, likely secondary to underlying structural abnormality, post-ictal state. Additionally there is moderate diffuse encephalopathy, nonspecific etiology but likely related to sedation, seizure.   Subjective: Feels well. No concerns today.  Discharge Exam: Vitals:   04/14/21 0813 04/14/21 1135  BP: (!) 155/64 138/70  Pulse: 91 95  Resp: 16 16  Temp: 98 F (36.7 C) 98.1 F (36.7 C)  SpO2: 95% 97%   Vitals:   04/14/21 0026 04/14/21 0403 04/14/21 0813 04/14/21 1135  BP: (!) 147/69 (!) 149/63 (!) 155/64 138/70  Pulse: 83 79 91 95  Resp: 17 18 16 16   Temp: 98.5 F (36.9 C) 97.9 F (36.6 C) 98 F (36.7 C) 98.1 F (36.7 C)  TempSrc: Oral Oral Oral Oral  SpO2: 97% 98% 95% 97%  Weight:      Height:         General: Pt is alert, awake, not in acute distress Cardiovascular: RRR, S1/S2 +, no rubs, no gallops Respiratory: CTA bilaterally, no wheezing, no rhonchi Abdominal: Soft, NT, ND, bowel sounds + Extremities: no edema, no cyanosis    The results of significant diagnostics from this hospitalization (including imaging, microbiology, ancillary and laboratory) are listed below for reference.     Microbiology: Recent Results (from the past 240 hour(s))  Urine culture     Status: Abnormal   Collection Time: 04/09/21  2:00 AM   Specimen: In/Out Cath Urine  Result Value Ref Range Status   Specimen Description IN/OUT CATH URINE  Final   Special Requests   Final    NONE Performed at [facility], [address], [place], [place] [address]    Culture (A)  Final    >=100,000 COLONIES/mL ESCHERICHIA COLI >=100,000 COLONIES/mL KLEBSIELLA PNEUMONIAE Confirmed Extended Spectrum Beta-Lactamase Producer (ESBL).  In bloodstream infections from ESBL organisms, carbapenems are preferred over piperacillin/tazobactam. They are shown to have a lower risk of mortality.    Report Status 04/12/2021 FINAL  Final   Organism ID, Bacteria ESCHERICHIA COLI (A)  Final   Organism ID, Bacteria KLEBSIELLA PNEUMONIAE (A)  Final      Susceptibility   Escherichia coli - MIC*    AMPICILLIN 8 SENSITIVE Sensitive     CEFAZOLIN <=4 SENSITIVE Sensitive     CEFEPIME <=0.12 SENSITIVE Sensitive     CEFTRIAXONE <=0.25 SENSITIVE Sensitive     CIPROFLOXACIN <=0.25 SENSITIVE Sensitive     GENTAMICIN <=1 SENSITIVE Sensitive     IMIPENEM <=0.25 SENSITIVE Sensitive     NITROFURANTOIN 32 SENSITIVE Sensitive     TRIMETH/SULFA <=20 SENSITIVE Sensitive     AMPICILLIN/SULBACTAM 4 SENSITIVE Sensitive     PIP/TAZO <=4 SENSITIVE Sensitive     * >=100,000 COLONIES/mL ESCHERICHIA COLI   Klebsiella pneumoniae - MIC*    AMPICILLIN >=32 RESISTANT Resistant     CEFAZOLIN >=64 RESISTANT Resistant     CEFEPIME >=32 RESISTANT  Resistant     CEFTRIAXONE >=64 RESISTANT Resistant     CIPROFLOXACIN 0.5 SENSITIVE Sensitive     GENTAMICIN <=1 SENSITIVE Sensitive     IMIPENEM 1 SENSITIVE Sensitive     NITROFURANTOIN 64 INTERMEDIATE Intermediate     TRIMETH/SULFA >=320 RESISTANT Resistant     AMPICILLIN/SULBACTAM >=32 RESISTANT Resistant     PIP/TAZO <=4 SENSITIVE Sensitive     * >=100,000 COLONIES/mL KLEBSIELLA PNEUMONIAE  MRSA Next Gen by PCR, Nasal     Status: None   Collection Time: 04/11/21  4:03 PM   Specimen: Nasal Mucosa; Nasal Swab  Result Value Ref Range Status   MRSA by PCR Next Gen NOT DETECTED NOT DETECTED Final    Comment: (NOTE) The GeneXpert MRSA Assay (FDA approved for NASAL specimens only), is one component of a comprehensive MRSA colonization surveillance program. It is not intended to diagnose MRSA infection nor to guide or monitor treatment for MRSA infections. Test performance is not FDA approved in patients less than 17 years old. Performed at [facility], [place] [address] [place], [place] [address]   Culture, blood (routine x 2)     Status: None (Preliminary result)   Collection Time: 04/12/21  9:30 AM   Specimen: BLOOD LEFT HAND  Result Value Ref Range Status   Specimen Description BLOOD LEFT HAND  Final   Special Requests   Final    BOTTLES DRAWN AEROBIC ONLY Blood Culture adequate volume   Culture   Final    NO GROWTH 2 DAYS Performed at [facility], [place] [address] [place], [place] [address]    Report Status PENDING  Incomplete  Culture, blood (routine x 2)     Status: None (Preliminary result)   Collection Time: 04/12/21  9:40 AM   Specimen: BLOOD RIGHT HAND  Result Value Ref Range Status   Specimen Description BLOOD RIGHT HAND  Final   Special Requests   Final    BOTTLES DRAWN AEROBIC ONLY Blood Culture adequate volume   Culture   Final    NO GROWTH 2 DAYS Performed at [facility], [place] [address], [place], [place] [address]    Report Status PENDING   Incomplete     Labs: BNP (last 3 results) No results for input(s): BNP in the last 8760 hours. Basic Metabolic Panel: Recent Labs  Lab 04/10/21 0307 04/11/21 0300 04/12/21 0421 04/13/21 0217 04/14/21 0242  NA 133* 134* 134* 133* 136  K 3.4* 3.3* 3.9 3.8 3.9  CL 103 102 102 103 105  CO2 19* 21* 24 24 23   GLUCOSE 129* 112* 87 92 91  BUN 13 11 9 8 10   CREATININE 0.75 0.80 0.80 0.83 0.68  CALCIUM 8.9 8.9 9.0 8.6* 8.8*  MG 1.8 1.8 1.9 1.9 1.9   Liver Function Tests: Recent Labs  Lab 04/08/21 1928 04/09/21 0447 04/12/21 0421  AST 25 21 34  ALT 16 14 42  ALKPHOS 42 41 33*  BILITOT 0.8 0.5 0.3  PROT 6.5 6.1* 5.1*  ALBUMIN 3.6 3.2* 2.9*   No results for input(s): LIPASE, AMYLASE in the last 168 hours. No results for input(s): AMMONIA in the last 168 hours. CBC: Recent Labs  Lab 04/10/21 0307 04/11/21 0300 04/12/21 0421 04/13/21 0217 04/14/21 0242  WBC 9.7 7.8 7.1 5.7 4.9  NEUTROABS 7.5 5.0 3.7 2.5 2.3  HGB 11.8* 11.2* 10.2* 10.3* 9.7*  HCT 35.0* 33.1* 29.8* 29.9* 29.3*  MCV 95.4 94.0 94.0 94.9 96.4  PLT 294  240 206 188 194   Cardiac Enzymes: No results for input(s): CKTOTAL, CKMB, CKMBINDEX, TROPONINI in the last 168 hours. BNP: Invalid input(s): POCBNP CBG: No results for input(s): GLUCAP in the last 168 hours. D-Dimer No results for input(s): DDIMER in the last 72 hours. Hgb A1c Recent Labs    04/12/21 0421  HGBA1C 5.2   Lipid Profile Recent Labs    04/12/21 0421  CHOL 207*  HDL 70  LDLCALC 121*  TRIG 80  CHOLHDL 3.0   Thyroid function studies No results for input(s): TSH, T4TOTAL, T3FREE, THYROIDAB in the last 72 hours.  Invalid input(s): FREET3 Anemia work up No results for input(s): VITAMINB12, FOLATE, FERRITIN, TIBC, IRON, RETICCTPCT in the last 72 hours. Urinalysis    Component Value Date/Time   COLORURINE STRAW (A) 04/11/2021 1823   APPEARANCEUR CLEAR 04/11/2021 1823   LABSPEC >1.046 (H) 04/11/2021 1823   PHURINE 5.0 04/11/2021  1823   GLUCOSEU NEGATIVE 04/11/2021 1823   HGBUR NEGATIVE 04/11/2021 1823   BILIRUBINUR NEGATIVE 04/11/2021 1823   BILIRUBINUR neg 06/10/2017 1728   KETONESUR NEGATIVE 04/11/2021 1823   PROTEINUR NEGATIVE 04/11/2021 1823   UROBILINOGEN 1.0 06/10/2017 1728   NITRITE NEGATIVE 04/11/2021 1823   LEUKOCYTESUR NEGATIVE 04/11/2021 1823   Sepsis Labs Invalid input(s): PROCALCITONIN,  WBC,  LACTICIDVEN Microbiology Recent Results (from the past 240 hour(s))  Urine culture     Status: Abnormal   Collection Time: 04/09/21  2:00 AM   Specimen: In/Out Cath Urine  Result Value Ref Range Status   Specimen Description IN/OUT CATH URINE  Final   Special Requests   Final    NONE Performed at [facility], [place] [address], [place], [place] [address]    Culture (A)  Final    >=100,000 COLONIES/mL ESCHERICHIA COLI >=100,000 COLONIES/mL KLEBSIELLA PNEUMONIAE Confirmed Extended Spectrum Beta-Lactamase Producer (ESBL).  In bloodstream infections from ESBL organisms, carbapenems are preferred over piperacillin/tazobactam. They are shown to have a lower risk of mortality.    Report Status 04/12/2021 FINAL  Final   Organism ID, Bacteria ESCHERICHIA COLI (A)  Final   Organism ID, Bacteria KLEBSIELLA PNEUMONIAE (A)  Final      Susceptibility   Escherichia coli - MIC*    AMPICILLIN 8 SENSITIVE Sensitive     CEFAZOLIN <=4 SENSITIVE Sensitive     CEFEPIME <=0.12 SENSITIVE Sensitive     CEFTRIAXONE <=0.25 SENSITIVE Sensitive     CIPROFLOXACIN <=0.25 SENSITIVE Sensitive     GENTAMICIN <=1 SENSITIVE Sensitive     IMIPENEM <=0.25 SENSITIVE Sensitive     NITROFURANTOIN 32 SENSITIVE Sensitive     TRIMETH/SULFA <=20 SENSITIVE Sensitive     AMPICILLIN/SULBACTAM 4 SENSITIVE Sensitive     PIP/TAZO <=4 SENSITIVE Sensitive     * >=100,000 COLONIES/mL ESCHERICHIA COLI   Klebsiella pneumoniae - MIC*    AMPICILLIN >=32 RESISTANT Resistant     CEFAZOLIN >=64 RESISTANT Resistant     CEFEPIME >=32 RESISTANT  Resistant     CEFTRIAXONE >=64 RESISTANT Resistant     CIPROFLOXACIN 0.5 SENSITIVE Sensitive     GENTAMICIN <=1 SENSITIVE Sensitive     IMIPENEM 1 SENSITIVE Sensitive     NITROFURANTOIN 64 INTERMEDIATE Intermediate     TRIMETH/SULFA >=320 RESISTANT Resistant     AMPICILLIN/SULBACTAM >=32 RESISTANT Resistant     PIP/TAZO <=4 SENSITIVE Sensitive     * >=100,000 COLONIES/mL KLEBSIELLA PNEUMONIAE  MRSA Next Gen by PCR, Nasal     Status: None   Collection Time: 04/11/21  4:03  PM   Specimen: Nasal Mucosa; Nasal Swab  Result Value Ref Range Status   MRSA by PCR Next Gen NOT DETECTED NOT DETECTED Final    Comment: (NOTE) The GeneXpert MRSA Assay (FDA approved for NASAL specimens only), is one component of a comprehensive MRSA colonization surveillance program. It is not intended to diagnose MRSA infection nor to guide or monitor treatment for MRSA infections. Test performance is not FDA approved in patients less than 39 years old. Performed at [facility], [place] [address] [place], [place] [address]   Culture, blood (routine x 2)     Status: None (Preliminary result)   Collection Time: 04/12/21  9:30 AM   Specimen: BLOOD LEFT HAND  Result Value Ref Range Status   Specimen Description BLOOD LEFT HAND  Final   Special Requests   Final    BOTTLES DRAWN AEROBIC ONLY Blood Culture adequate volume   Culture   Final    NO GROWTH 2 DAYS Performed at [facility], [place] [address] [place], [place] [address]    Report Status PENDING  Incomplete  Culture, blood (routine x 2)     Status: None (Preliminary result)   Collection Time: 04/12/21  9:40 AM   Specimen: BLOOD RIGHT HAND  Result Value Ref Range Status   Specimen Description BLOOD RIGHT HAND  Final   Special Requests   Final    BOTTLES DRAWN AEROBIC ONLY Blood Culture adequate volume   Culture   Final    NO GROWTH 2 DAYS Performed at [facility], [place] [address], [place], [place] [address]    Report Status PENDING   Incomplete     Time coordinating discharge: 35 minutes  SIGNED:   Cordelia Poche, MD [facility] 04/14/2021, 3:37 PM

## 2021-04-15 ENCOUNTER — Ambulatory Visit: Payer: Medicare Other | Admitting: Family Medicine

## 2021-04-15 ENCOUNTER — Telehealth: Payer: Self-pay

## 2021-04-15 ENCOUNTER — Other Ambulatory Visit: Payer: Self-pay | Admitting: Family Medicine

## 2021-04-15 DIAGNOSIS — F411 Generalized anxiety disorder: Secondary | ICD-10-CM

## 2021-04-15 NOTE — Telephone Encounter (Signed)
Transition Care Management Follow-up Telephone Call Date of discharge and from where: [facility] 04/14/2021 How have you been since you were released from the hospital? Im ok per patient  Any questions or concerns? No  Items Reviewed: Did the pt receive and understand the discharge instructions provided? Yes  Medications obtained and verified? Yes  Other? Yes  Any new allergies since your discharge? No  Dietary orders reviewed? Yes Do you have support at home? No   Home Care and Equipment/Supplies: Were home health services ordered? yes If so, what is the name of the agency? Living in [place] will use her own sitters  Has the agency set up a time to come to the patient's home? no Were any new equipment or medical supplies ordered?  No What is the name of the medical supply agency? N/a Were you able to get the supplies/equipment? not applicable Do you have any questions related to the use of the equipment or supplies? No  Functional Questionnaire: (I = Independent and D = Dependent) ADLs: D  Bathing/Dressing- D  Meal Prep- D  Eating- D  Maintaining continence- D  Transferring/Ambulation- D  Managing Meds- D  Follow up appointments reviewed:  PCP Hospital f/u appt confirmed? Yes  Scheduled to see 04/17/2021 on 1030 @ with Dr.Jordan  Are transportation arrangements needed? Yes  Son will arrange with [facility]  If their condition worsens, is the pt aware to call PCP or go to the Emergency Dept.? Yes Was the patient provided with contact information for the PCP's office or ED? Yes Was to pt encouraged to call back with questions or concerns? Yes

## 2021-04-16 DIAGNOSIS — R262 Difficulty in walking, not elsewhere classified: Secondary | ICD-10-CM | POA: Diagnosis not present

## 2021-04-16 DIAGNOSIS — M6281 Muscle weakness (generalized): Secondary | ICD-10-CM | POA: Diagnosis not present

## 2021-04-16 DIAGNOSIS — M5459 Other low back pain: Secondary | ICD-10-CM | POA: Diagnosis not present

## 2021-04-16 DIAGNOSIS — M25562 Pain in left knee: Secondary | ICD-10-CM | POA: Diagnosis not present

## 2021-04-17 ENCOUNTER — Other Ambulatory Visit: Payer: Self-pay

## 2021-04-17 ENCOUNTER — Ambulatory Visit (INDEPENDENT_AMBULATORY_CARE_PROVIDER_SITE_OTHER): Payer: Medicare Other | Admitting: Family Medicine

## 2021-04-17 ENCOUNTER — Encounter: Payer: Self-pay | Admitting: Family Medicine

## 2021-04-17 VITALS — BP 128/80 | HR 106 | Resp 16 | Ht 62.0 in

## 2021-04-17 DIAGNOSIS — G40909 Epilepsy, unspecified, not intractable, without status epilepticus: Secondary | ICD-10-CM | POA: Diagnosis not present

## 2021-04-17 DIAGNOSIS — K5900 Constipation, unspecified: Secondary | ICD-10-CM | POA: Diagnosis not present

## 2021-04-17 DIAGNOSIS — G43009 Migraine without aura, not intractable, without status migrainosus: Secondary | ICD-10-CM | POA: Diagnosis not present

## 2021-04-17 DIAGNOSIS — G43C0 Periodic headache syndromes in child or adult, not intractable: Secondary | ICD-10-CM | POA: Diagnosis not present

## 2021-04-17 DIAGNOSIS — F411 Generalized anxiety disorder: Secondary | ICD-10-CM | POA: Diagnosis not present

## 2021-04-17 DIAGNOSIS — N390 Urinary tract infection, site not specified: Secondary | ICD-10-CM | POA: Diagnosis not present

## 2021-04-17 DIAGNOSIS — I10 Essential (primary) hypertension: Secondary | ICD-10-CM

## 2021-04-17 DIAGNOSIS — G8929 Other chronic pain: Secondary | ICD-10-CM

## 2021-04-17 DIAGNOSIS — D509 Iron deficiency anemia, unspecified: Secondary | ICD-10-CM | POA: Diagnosis not present

## 2021-04-17 DIAGNOSIS — M545 Low back pain, unspecified: Secondary | ICD-10-CM

## 2021-04-17 LAB — BASIC METABOLIC PANEL
BUN: 11 mg/dL (ref 6–23)
CO2: 29 mEq/L (ref 19–32)
Calcium: 10.2 mg/dL (ref 8.4–10.5)
Chloride: 99 mEq/L (ref 96–112)
Creatinine, Ser: 0.93 mg/dL (ref 0.40–1.20)
GFR: 56.52 mL/min — ABNORMAL LOW (ref >60.00)
Glucose, Bld: 98 mg/dL (ref 70–99)
Potassium: 4.2 mEq/L (ref 3.5–5.1)
Sodium: 138 mEq/L (ref 135–145)

## 2021-04-17 LAB — CBC
HCT: 35.4 % — ABNORMAL LOW (ref 36.0–46.0)
Hemoglobin: 11.8 g/dL — ABNORMAL LOW (ref 12.0–15.0)
MCHC: 33.4 g/dL (ref 30.0–36.0)
MCV: 96.4 fl (ref 78.0–100.0)
Platelets: 288 10*3/uL (ref 150.0–400.0)
RBC: 3.68 Mil/uL — ABNORMAL LOW (ref 3.87–5.11)
RDW: 13.9 % (ref 11.5–15.5)
WBC: 6.6 10*3/uL (ref 4.0–10.5)

## 2021-04-17 LAB — CULTURE, BLOOD (ROUTINE X 2)
Culture: NO GROWTH
Culture: NO GROWTH
Special Requests: ADEQUATE
Special Requests: ADEQUATE

## 2021-04-17 MED ORDER — MAXALT-MLT 10 MG PO TBDP: ORAL_TABLET | ORAL | 0 refills | Status: DC

## 2021-04-17 NOTE — Progress Notes (Signed)
HPI: Tracey Armstrong is a 86 y.o. female, who is here today with [facility] to follow on recent hospitalization.  Hospitalized from 04/08/2021 to 04/14/2021. She presented to the ER via EMS from her independent living facility due to MS changes. During ED evaluation it was hard to obtain hx, so her son provide a history by the phone: A week of lightheadedness and weakness.  She was previously evaluated in ER, 04/02/21, for hypotension.  COVID-19 PCR positive, it has been + during prior visit, 1-2 weeks ago at the independent living facility. Mild lactic acidosis, with lactic acid level 2.2 and with severe HTN, she was treated with IV labetalol.  UA with positive nitrate, started with empiric treatment for UTI with IV ceftriaxone and Ertapenem x 2 doses. She was discharged on Ciprofloxacin.  On 04/11/21 it was noted new aphasia and lef-sided gaze preference, concerning for acute CVA. Neuro consultation and started on TPA, transferred to ICU; found to have status epilepticus.  Head CT 04/12/21:  1. No evidence of acute intracranial abnormality. 2. Chronic microvascular disease and atrophy.  Brain MRI last done on 04/13/21: 1. Stable brain MRI. No acute intracranial infarct or other abnormality. 2. Atrophy with chronic microvascular ischemic disease. 3. 1 cm right parietal convexity meningioma.  She was started on Keppra 500 mg bid. She has tolerated medication ell. No episodes since hospital discharge. She had some fever during hospitalization and after seizure episode.  Cognitively she is back to her baseline, still feeling fatigue. Appetite is "good." She is using a walker. She has an aid 24/7.  Started PT at [facility] independent living, she has an order to be signed.  Anemia: She is on Ferrous Sulfate 325 mg daily.   Lab Results  Component Value Date   WBC 4.9 04/14/2021   HGB 9.7 (L) 04/14/2021   HCT 29.3 (L) 04/14/2021   MCV 96.4 04/14/2021   PLT 194  04/14/2021   HTN: She is still on Verapamil 180 mg daily. Checking BP, forgot BP log, but is has been "fine."  Lab Results  Component Value Date   CREATININE 0.68 04/14/2021   BUN 10 04/14/2021   NA 136 04/14/2021   K 3.9 04/14/2021   CL 105 04/14/2021   CO2 23 04/14/2021   Lab Results  Component Value Date   ALT 42 04/12/2021   AST 34 04/12/2021   ALKPHOS 33 (L) 04/12/2021   BILITOT 0.3 04/12/2021   Blood culture (right-handed) no growth in 5 days (04/17/2021 final). Left hand: No qualified days (final on 04/17/2021).   Specimen Description BLOOD RIGHT HAND   Special Requests BOTTLES DRAWN AEROBIC ONLY Blood Culture adequate volume   Culture NO GROWTH 5 DAYS  Performed at [facility], [address], [place], [place] [address]   Report Status 04/17/2021 FINAL   Urine culture (In-N-Out cath): Grew E. coli and Klebsiella pneumonia sensitive to Cipro, gentamicin,and imipenem.  Instructed to follow-up with neurologist in 6 weeks. Requesting refills for Maxalt, which she takes as needed for migraine headaches. No new associated symptoms.  Chronic back pain: She is still on Gabapentin and Zanaflex. Anxiety on Alprazolam 1 mg 1/2-1 tab prn daily, max 1.5 tab daily. She has been on Alprazolam for many years.  Constipation: She is having daily bowel movement, last one this morning. She is on Mg supplementation at bedtime, which has helped. Negative for abdominal pain,melena,or blood in stool.  Review of Systems  Constitutional:  Positive for activity change  and fatigue. Negative for fever.  HENT:  Negative for mouth sores, nosebleeds and sore throat.   Eyes:  Negative for redness and visual disturbance.  Respiratory:  Negative for cough, shortness of breath and wheezing.   Cardiovascular:  Negative for chest pain and palpitations.  Gastrointestinal:  Negative for nausea and vomiting.  Genitourinary:  Negative for decreased urine volume, dysuria and hematuria.   Musculoskeletal:  Positive for back pain and gait problem.  Skin:  Negative for pallor and rash.  Neurological:  Negative for syncope and facial asymmetry.  Psychiatric/Behavioral:  Negative for confusion. The patient is nervous/anxious.   Rest see pertinent positives and negatives per HPI.  Current Outpatient Medications on File Prior to Visit  Medication Sig Dispense Refill   acetaminophen (TYLENOL) 650 MG CR tablet Take 975 mg by mouth daily.     antiseptic oral rinse (BIOTENE) LIQD Use 3 mls 3-4 times per day for dry mouth. Do not swallow. 237 mL 0   Cyanocobalamin (VITAMIN B-12 PO) Take 1 tablet by mouth daily with lunch.     Dextran 70-Hypromellose (ARTIFICIAL TEARS PF OP) Place 1-2 drops into both eyes See admin instructions. Instill 1-2 drops into both eyes at bedtime and an additional 2-3 times a day as needed for dryness     diphenhydramine-acetaminophen (TYLENOL PM) 25-500 MG TABS tablet Take 1.5 tablets by mouth at bedtime.     docusate sodium (COLACE) 100 MG capsule Take 1 capsule (100 mg total) by mouth 2 (two) times daily. 10 capsule 0   gabapentin (NEURONTIN) 300 MG capsule Take 1 capsule (300 mg total) by mouth 2 (two) times daily. 60 capsule 2   levETIRAcetam (KEPPRA) 500 MG tablet Take 1 tablet (500 mg total) by mouth 2 (two) times daily. 60 tablet 2   Magnesium 400 MG TABS Take 400 mg by mouth at bedtime.     Melatonin 10 MG TABS Take 10 mg by mouth at bedtime as needed (for sleep).     omeprazole (PRILOSEC OTC) 20 MG tablet Take 20 mg by mouth daily before breakfast.     oxybutynin (DITROPAN-XL) 10 MG 24 hr tablet TAKE 1 TABLET(10 MG) BY MOUTH AT BEDTIME 90 tablet 2   POTASSIUM PO Take 1 tablet by mouth daily with lunch.     pyridOXINE (VITAMIN B-6) 100 MG tablet Take 400 mg by mouth daily with lunch.     riboflavin (VITAMIN B-2) 100 MG TABS tablet Take 400 mg by mouth daily with lunch.     tiZANidine (ZANAFLEX) 4 MG tablet Hold while taking Ciprofloxacin 60 tablet 0    tretinoin (RETIN-A) 0.1 % cream APPLY TO AFFECTED AREA EVERY DAY AT BEDTIME 45 g 2   verapamil (VERELAN PM) 180 MG 24 hr capsule TAKE 1 CAPSULE(180 MG) BY MOUTH DAILY 90 capsule 3   Vitamin D, Ergocalciferol, (DRISDOL) 1.25 MG (50000 UNIT) CAPS capsule TAKE 1 CAPSULE BY MOUTH EVERY 2 WEEKS 7 capsule 2   ALPRAZolam (XANAX) 1 MG tablet TAKE 1/2 TABLET BY MOUTH AT NOON AND 1 TABLET BY MOUTH EVERY NIGHT AT BEDTIME 45 tablet 2   No current facility-administered medications on file prior to visit.   Past Medical History:  Diagnosis Date   Anxiety    Arthritis    Chronic insomnia 03/30/2015   Chronic low back pain 12/02/2016   Depression    GERD (gastroesophageal reflux disease)    Hiatal hernia    High cholesterol    Hypertelorism    Hypertension  Meningioma (HCC)    Migraine    Allergies  Allergen Reactions   Dihydrotachysterol Other (See Comments)    Dihydrotachysterol is a form of vitamin D. Might be "DHT Intensol" (Name Brand)- Reaction unknown- tolerating 50,000 units of Vitamin D-2 (Drisdol) in 2022   Trazodone And Nefazodone Other (See Comments)    Worsens headaches   Valium [Diazepam] Other (See Comments)    headaches   Citalopram Other (See Comments)    Other reaction(s): Headache   Other Other (See Comments)    "States all antidepressants cause migraines." - tolerates Xanax     Social History   Socioeconomic History   Marital status: Widowed    Spouse name: Not on file   Number of children: 4   Years of education: 21   Highest education level: Not on file  Occupational History   Occupation: Retired  Tobacco Use   Smoking status: Never   Smokeless tobacco: Never  Vaping Use   Vaping Use: Never used  Substance and Sexual Activity   Alcohol use: No   Drug use: No   Sexual activity: Not Currently  Other Topics Concern   Not on file  Social History Narrative   Lives in [place]      Patient drinks 1 glass of tea daily.      Patient is right handed.    Social Determinants of Health   Financial Resource Strain: Not on file  Food Insecurity: Not on file  Transportation Needs: Not on file  Physical Activity: Not on file  Stress: Not on file  Social Connections: Not on file   Vitals:   04/17/21 1022  BP: 128/80  Pulse: (!) 106  Resp: 16  SpO2: 98%   Wt Readings from Last 3 Encounters:  04/11/21 128 lb 12 oz (58.4 kg)  04/02/21 136 lb 14.5 oz (62.1 kg)  12/12/20 137 lb (62.1 kg)   Body mass index is 23.55 kg/m.  Physical Exam Vitals and nursing note reviewed.  Constitutional:      General: She is not in acute distress.    Appearance: She is well-developed.  HENT:     Head: Normocephalic and atraumatic.     Mouth/Throat:     Mouth: Mucous membranes are moist.     Pharynx: Oropharynx is clear.  Eyes:     Conjunctiva/sclera: Conjunctivae normal.  Cardiovascular:     Rate and Rhythm: Regular rhythm. Tachycardia present.     Pulses:          Dorsalis pedis pulses are 2+ on the right side and 2+ on the left side.     Heart sounds: No murmur heard.    Comments: Trace pitting LE edema, bilateral. Pulmonary:     Effort: Pulmonary effort is normal. No respiratory distress.     Breath sounds: Normal breath sounds.  Abdominal:     Palpations: Abdomen is soft. There is no mass.     Tenderness: There is no abdominal tenderness.  Lymphadenopathy:     Cervical: No cervical adenopathy.  Skin:    General: Skin is warm.     Findings: Ecchymosis (On upper extremities (forearms) and left calf (medially).) present. No erythema or rash.  Neurological:     General: No focal deficit present.     Mental Status: She is alert and oriented to person, place, and time.     Comments: Mildly unstable gait assisted with a walker.  Psychiatric:        Mood and Affect: Mood  is anxious.     Comments: Well groomed, good eye contact.    ASSESSMENT AND PLAN:  Tracey Armstrong was seen today for hospitalization follow-up.  Diagnoses and all orders  for this visit:  Orders Placed This Encounter  Procedures   Basic metabolic panel   CBC   Lab Results  Component Value Date   WBC 6.6 04/17/2021   HGB 11.8 (L) 04/17/2021   HCT 35.4 (L) 04/17/2021   MCV 96.4 04/17/2021   PLT 288.0 04/17/2021   Lab Results  Component Value Date   CREATININE 0.93 04/17/2021   BUN 11 04/17/2021   NA 138 04/17/2021   K 4.2 04/17/2021   CL 99 04/17/2021   CO2 29 04/17/2021   Migraine without aura and without status migrainosus, not intractable Problem has been stable for a couple years. Today I sent Rx for Maxalt to her pharmacy but recommend continue following with neurologist.  -     MAXALT-MLT 10 MG disintegrating tablet; DISSOLVE ONE TABLET BY MOUTH AS NEEDED HEADACHE. Establishing with neurologist.  Iron deficiency anemia, unspecified iron deficiency anemia type Continue Ferrous sulfate 325 mg daily. Further recommendations according to CBC result.  Generalized anxiety disorder Stable otherwise. She has not tolerated SSRI's and SNRI's in the past. Continue Alprazolam up to 1.5 mg daily.  Essential hypertension BP adequately controlled. Continue Verapamil 180 mg daily. Continue monitoring BP regularly and low salt diet.  Seizure disorder ([place]) Acute encephalopathy/UTI. Brain MRI did not show acute CVA. PT form signed.  Continue Keppra 500 mg bid, she has enough medication at this time. Keep appt with neurologist.  Urinary tract infection without hematuria, site unspecified Asymptomatic. Complete abx treatment, Cipro. Instructed about warning signs.  Chronic low back pain, unspecified back pain laterality, unspecified whether sciatica present Stable. Fopr now continue Gabapentin 300 mg bid and Zanaflex 2-4 mg TID as needed (holding it until she completes Cipro) Fall precautions.  Constipation, unspecified constipation type Mg Oxide at night seems to be helping. Adequate fiber and fluid intake.  Return in about 3 months  (around 07/18/2021).   Khaleem Burchill G. [place], MD  [facility]. [place] office.

## 2021-04-17 NOTE — Patient Instructions (Addendum)
A few things to remember from today's visit:   Periodic headache syndrome, not intractable  Migraine without aura and without status migrainosus, not intractable - Plan: MAXALT-MLT 10 MG disintegrating tablet  Iron deficiency anemia, unspecified iron deficiency anemia type  Generalized anxiety disorder  Essential hypertension  If you need refills please call your pharmacy. Do not use My Chart to request refills or for acute issues that need immediate attention.   Complete antibiotic treatment. Adequate hydration. Keep appt with neurologist. PT form signed. No changes in current medications.  Please be sure medication list is accurate. If a new problem present, please set up appointment sooner than planned today.

## 2021-04-19 ENCOUNTER — Telehealth: Payer: Self-pay | Admitting: Family Medicine

## 2021-04-19 DIAGNOSIS — M6281 Muscle weakness (generalized): Secondary | ICD-10-CM | POA: Diagnosis not present

## 2021-04-19 DIAGNOSIS — R262 Difficulty in walking, not elsewhere classified: Secondary | ICD-10-CM | POA: Diagnosis not present

## 2021-04-19 DIAGNOSIS — M5459 Other low back pain: Secondary | ICD-10-CM | POA: Diagnosis not present

## 2021-04-19 DIAGNOSIS — M25562 Pain in left knee: Secondary | ICD-10-CM | POA: Diagnosis not present

## 2021-04-19 DIAGNOSIS — R41841 Cognitive communication deficit: Secondary | ICD-10-CM | POA: Diagnosis not present

## 2021-04-19 NOTE — Telephone Encounter (Signed)
Patient ended up in the hospital the other day and wants to know if she still needs to keep taking her Gabapentin or stop taking it. She didn't see those instructions in her discharge papers.  Please advise

## 2021-04-22 DIAGNOSIS — Z1159 Encounter for screening for other viral diseases: Secondary | ICD-10-CM | POA: Diagnosis not present

## 2021-04-22 DIAGNOSIS — Z20828 Contact with and (suspected) exposure to other viral communicable diseases: Secondary | ICD-10-CM | POA: Diagnosis not present

## 2021-04-23 DIAGNOSIS — R262 Difficulty in walking, not elsewhere classified: Secondary | ICD-10-CM | POA: Diagnosis not present

## 2021-04-23 DIAGNOSIS — M6281 Muscle weakness (generalized): Secondary | ICD-10-CM | POA: Diagnosis not present

## 2021-04-23 DIAGNOSIS — M25562 Pain in left knee: Secondary | ICD-10-CM | POA: Diagnosis not present

## 2021-04-23 DIAGNOSIS — R41841 Cognitive communication deficit: Secondary | ICD-10-CM | POA: Diagnosis not present

## 2021-04-23 DIAGNOSIS — M5459 Other low back pain: Secondary | ICD-10-CM | POA: Diagnosis not present

## 2021-04-23 NOTE — Telephone Encounter (Signed)
I called and spoke with patient. Per last OV note, patient is to continue gabapentin. Patient verbalized understanding.

## 2021-04-25 DIAGNOSIS — M6281 Muscle weakness (generalized): Secondary | ICD-10-CM | POA: Diagnosis not present

## 2021-04-25 DIAGNOSIS — R41841 Cognitive communication deficit: Secondary | ICD-10-CM | POA: Diagnosis not present

## 2021-04-25 DIAGNOSIS — R262 Difficulty in walking, not elsewhere classified: Secondary | ICD-10-CM | POA: Diagnosis not present

## 2021-04-25 DIAGNOSIS — M25562 Pain in left knee: Secondary | ICD-10-CM | POA: Diagnosis not present

## 2021-04-25 DIAGNOSIS — M5459 Other low back pain: Secondary | ICD-10-CM | POA: Diagnosis not present

## 2021-04-26 DIAGNOSIS — M6281 Muscle weakness (generalized): Secondary | ICD-10-CM | POA: Diagnosis not present

## 2021-04-26 DIAGNOSIS — M5459 Other low back pain: Secondary | ICD-10-CM | POA: Diagnosis not present

## 2021-04-26 DIAGNOSIS — R262 Difficulty in walking, not elsewhere classified: Secondary | ICD-10-CM | POA: Diagnosis not present

## 2021-04-26 DIAGNOSIS — R41841 Cognitive communication deficit: Secondary | ICD-10-CM | POA: Diagnosis not present

## 2021-04-26 DIAGNOSIS — M25562 Pain in left knee: Secondary | ICD-10-CM | POA: Diagnosis not present

## 2021-04-29 DIAGNOSIS — Z1159 Encounter for screening for other viral diseases: Secondary | ICD-10-CM | POA: Diagnosis not present

## 2021-04-29 DIAGNOSIS — Z20828 Contact with and (suspected) exposure to other viral communicable diseases: Secondary | ICD-10-CM | POA: Diagnosis not present

## 2021-04-30 ENCOUNTER — Ambulatory Visit (INDEPENDENT_AMBULATORY_CARE_PROVIDER_SITE_OTHER): Payer: Medicare Other | Admitting: Neurology

## 2021-04-30 ENCOUNTER — Encounter: Payer: Self-pay | Admitting: Neurology

## 2021-04-30 ENCOUNTER — Other Ambulatory Visit: Payer: Self-pay

## 2021-04-30 VITALS — BP 122/82 | HR 105 | Ht 62.0 in | Wt 124.2 lb

## 2021-04-30 DIAGNOSIS — M6281 Muscle weakness (generalized): Secondary | ICD-10-CM | POA: Diagnosis not present

## 2021-04-30 DIAGNOSIS — G43009 Migraine without aura, not intractable, without status migrainosus: Secondary | ICD-10-CM

## 2021-04-30 DIAGNOSIS — R262 Difficulty in walking, not elsewhere classified: Secondary | ICD-10-CM | POA: Diagnosis not present

## 2021-04-30 DIAGNOSIS — R569 Unspecified convulsions: Secondary | ICD-10-CM | POA: Diagnosis not present

## 2021-04-30 DIAGNOSIS — R41841 Cognitive communication deficit: Secondary | ICD-10-CM | POA: Diagnosis not present

## 2021-04-30 DIAGNOSIS — M25562 Pain in left knee: Secondary | ICD-10-CM | POA: Diagnosis not present

## 2021-04-30 DIAGNOSIS — M5459 Other low back pain: Secondary | ICD-10-CM | POA: Diagnosis not present

## 2021-04-30 MED ORDER — LEVETIRACETAM 500 MG PO TABS: 500.0000 mg | ORAL_TABLET | Freq: Two times a day (BID) | ORAL | 3 refills | Status: DC

## 2021-04-30 MED ORDER — MAXALT-MLT 10 MG PO TBDP: ORAL_TABLET | ORAL | 11 refills | Status: DC

## 2021-04-30 NOTE — Patient Instructions (Signed)
Continue all your current medications. Refills sent for Keppra 500mg  twice a day and as needed Maxalt. Hold off on Tizanidine, continue physical therapy and increasing hydration. Keep a calendar of the electrical shock sensations. Follow-up in 6 months, call for any changes   Seizure Precautions: 1. If medication has been prescribed for you to prevent seizures, take it exactly as directed.  Do not stop taking the medicine without talking to your doctor first, even if you have not had a seizure in a long time.   2. Avoid activities in which a seizure would cause danger to yourself or to others.  Don't operate dangerous machinery, swim alone, or climb in high or dangerous places, such as on ladders, roofs, or girders.  Do not drive unless your doctor says you may.  3. If you have any warning that you may have a seizure, lay down in a safe place where you can't hurt yourself.    4.  No driving for 6 months from last seizure, as per [facility].   Please refer to the following link on the [place] website for more information: http://www.epilepsyfoundation.org/answerplace/Social/driving/drivingu.cfm   5.  Maintain good sleep hygiene.  6.  Contact your doctor if you have any problems that may be related to the medicine you are taking.  7.  Call 911 and bring the patient back to the ED if:        A.  The seizure lasts longer than 5 minutes.       B.  The patient doesn't awaken shortly after the seizure  C.  The patient has new problems such as difficulty seeing, speaking or moving  D.  The patient was injured during the seizure  E.  The patient has a temperature over 102 F (39C)  F.  The patient vomited and now is having trouble breathing

## 2021-04-30 NOTE — Progress Notes (Signed)
NEUROLOGY FOLLOW UP OFFICE NOTE  Tracey Armstrong [id number] [birth year]  HISTORY OF PRESENT ILLNESS: I had the pleasure of seeing Tracey Armstrong in follow-up in the neurology clinic on 04/30/2021.  She is accompanied by her son Tracey Armstrong who helps supplement the history today. The patient was last seen 2 years ago for "electrical sensations" feeling like her whole Armstrong was being electrocuted. Brain MRI without contrast done in 05/2019 showed unchanged 9 x 49mm right parietal meningioma, no acute changes. EEG in 04/2019 was normal. She was lost to follow-up and presents today after hospital admission in June 2022. She was admitted on 04/02/21 for confusion and disorientation. She was initially hypotensive but sepsis workup was negative. She was discharged home then returned on 04/09/21 with increased confusion, UA positive for nitrates and started on IV Ceftriaxone. While in the hospital, she had sudden onset unresponsiveness, she was awake but not following commands with left gaze deviation. As she was being given IV-tPA, stat EEG showed bifrontal periodic epileptiform discharges at 3Hz  consistent with nonconvulsive status epilepticus. With seizure resolution, there was diffuse and right hemisphere slowing. Brain MRI x 2 did not show any acute changes, stable 1cm right parietal convexity meningioma. She was discharged home on Levetiracetam 500mg  BID.  She reports that she was taking Gabapentin 300mg  BID for the past year, started by Dr. Martinique "because I thought I was having seizures a year ago." On review of notes, Gabapentin was started for low back pain with radiculopathy. She had continued to have the "electrical shock" sensations and was convinced it was due to the electrical set-up on the third floor. She moved to the second floor a year ago and felt that the sensations were not as bad but still occurring at least once a week. She called Tracey Armstrong a month ago when she had a bigger electrical shock. Tracey Armstrong noted she was  confused a bit but more due to fear that the switch sensation was very pronounced. When she was in the hospital the second time, she was hallucinating, telling her daughter-in-law, there was a rabbit on the floor. BP was initially elevated, Tracey Armstrong reports that this was stabilized and she was getting ready for discharge when he was called 2 hours later that she was having a stroke, then later on told she had a seizure. Since hospital discharge a week ago, Tracey Armstrong has noticed remarkable cognitive improvement. In the past (prior to her recent hospitalizations), she would have episodes where she would have difficulty signing her name or recall certain facts. She has not had the electrical sensations in the past week. She denies any side effects on Levetiracetam 500mg  BID. She reports migraines have been controlled with combination of magnesium, B1, and B6. She was unable to take them while in the hospital so she had a migraine recently, with good response to Maxalt. Tizanidine was also stopped while she was on antibiotics. She reports back pain, she fell in June 2021 and fractured her lumbar vertebra, then had another fall in August 2021 and broke her left femur. She ambulates with a walker and has regular physical therapy. She manages her own medications. She does not drive.    History on Initial Assessment 05/16/2019:  This is an 86 year old right-handed woman with a history of hypertension, hyperlipidemia, anxiety, chronic pain, chronic migraine, right parietal meningioma, presenting for evaluation of "electrical sensations" that started 2 months ago. The first time it happened on 03/09/2019 she was on the 4th floor and felt like there  was electricity all over her room. She went to the kitchen and recalls there was a storm outside. She recalls taking her medications out, then suddenly it felt like her whole Armstrong was being electrocuted. She felt weak and fell backward, hitting the left side of her head. They called EMS  and called her son, she felt better by the time he got there 2-3 hours later. She did fine for several days until 5/30, she was sitting then again felt the same electricity sensation. She left the room and felt like she would fall, again calling EMS. She reports her BP was 217/107. The next time this happened was 2-3 weeks ago, she got up one morning and could not walk. She then started walking backwards, unable to control herself, and fell back, hitting her left lower mid-back. She does not think she was confused during these episodes. There has been no loss of consciousness, tongue bite or incontinence. She has asked for 2 surge protectors in her room. One time she stretched her legs and felt funny. She thinks there have been electrical problems at [facility] because one time all the fire alarms went off.   PAST MEDICAL HISTORY: Past Medical History:  Diagnosis Date   Anxiety    Arthritis    Chronic insomnia 03/30/2015   Chronic low back pain 12/02/2016   Depression    GERD (gastroesophageal reflux disease)    Hiatal hernia    High cholesterol    Hypertelorism    Hypertension    Meningioma (HCC)    Migraine     MEDICATIONS: Current Outpatient Medications on File Prior to Visit  Medication Sig Dispense Refill   acetaminophen (TYLENOL) 650 MG CR tablet Take 975 mg by mouth daily.     ALPRAZolam (XANAX) 1 MG tablet TAKE 1/2 TABLET BY MOUTH AT NOON AND 1 TABLET BY MOUTH EVERY NIGHT AT BEDTIME 45 tablet 2   antiseptic oral rinse (BIOTENE) LIQD Use 3 mls 3-4 times per day for dry mouth. Do not swallow. 237 mL 0   Cyanocobalamin (VITAMIN B-12 PO) Take 1 tablet by mouth daily with lunch.     Dextran 70-Hypromellose (ARTIFICIAL TEARS PF OP) Place 1-2 drops into both eyes See admin instructions. Instill 1-2 drops into both eyes at bedtime and an additional 2-3 times a day as needed for dryness     diphenhydramine-acetaminophen (TYLENOL PM) 25-500 MG TABS tablet Take 1.5 tablets by mouth at bedtime.      docusate sodium (COLACE) 100 MG capsule Take 1 capsule (100 mg total) by mouth 2 (two) times daily. 10 capsule 0   gabapentin (NEURONTIN) 300 MG capsule Take 1 capsule (300 mg total) by mouth 2 (two) times daily. 60 capsule 2   levETIRAcetam (KEPPRA) 500 MG tablet Take 1 tablet (500 mg total) by mouth 2 (two) times daily. 60 tablet 2   Magnesium 400 MG TABS Take 400 mg by mouth at bedtime.     MAXALT-MLT 10 MG disintegrating tablet DISSOLVE ONE TABLET BY MOUTH AS NEEDED HEADACHE. Establishing with neurologist. 9 tablet 0   Melatonin 10 MG TABS Take 10 mg by mouth at bedtime as needed (for sleep).     omeprazole (PRILOSEC OTC) 20 MG tablet Take 20 mg by mouth daily before breakfast.     oxybutynin (DITROPAN-XL) 10 MG 24 hr tablet TAKE 1 TABLET(10 MG) BY MOUTH AT BEDTIME 90 tablet 2   POTASSIUM PO Take 1 tablet by mouth daily with lunch.     pyridOXINE (VITAMIN  B-6) 100 MG tablet Take 400 mg by mouth daily with lunch.     riboflavin (VITAMIN B-2) 100 MG TABS tablet Take 400 mg by mouth daily with lunch.     tiZANidine (ZANAFLEX) 4 MG tablet Hold while taking Ciprofloxacin 60 tablet 0   tretinoin (RETIN-A) 0.1 % cream APPLY TO AFFECTED AREA EVERY DAY AT BEDTIME 45 g 2   verapamil (VERELAN PM) 180 MG 24 hr capsule TAKE 1 CAPSULE(180 MG) BY MOUTH DAILY 90 capsule 3   Vitamin D, Ergocalciferol, (DRISDOL) 1.25 MG (50000 UNIT) CAPS capsule TAKE 1 CAPSULE BY MOUTH EVERY 2 WEEKS 7 capsule 2   No current facility-administered medications on file prior to visit.    ALLERGIES: Allergies  Allergen Reactions   Dihydrotachysterol Other (See Comments)    Dihydrotachysterol is a form of vitamin D. Might be "DHT Intensol" (Name Brand)- Reaction unknown- tolerating 50,000 units of Vitamin D-2 (Drisdol) in 2022   Trazodone And Nefazodone Other (See Comments)    Worsens headaches   Valium [Diazepam] Other (See Comments)    headaches   Citalopram Other (See Comments)    Other reaction(s): Headache    Other Other (See Comments)    "States all antidepressants cause migraines." - tolerates Xanax     FAMILY HISTORY: Family History  Problem Relation Age of Onset   Migraines Father    Stroke Father    Heart Problems Mother    Epilepsy Brother     SOCIAL HISTORY: Social History   Socioeconomic History   Marital status: Widowed    Spouse name: Not on file   Number of children: 4   Years of education: 72   Highest education level: Not on file  Occupational History   Occupation: Retired  Tobacco Use   Smoking status: Never   Smokeless tobacco: Never  Vaping Use   Vaping Use: Never used  Substance and Sexual Activity   Alcohol use: No   Drug use: No   Sexual activity: Not Currently  Other Topics Concern   Not on file  Social History Narrative   Lives in [facility]      Patient drinks 1 glass of tea daily.      Patient is right handed.   Social Determinants of Health   Financial Resource Strain: Not on file  Food Insecurity: Not on file  Transportation Needs: Not on file  Physical Activity: Not on file  Stress: Not on file  Social Connections: Not on file  Intimate Partner Violence: Not on file     PHYSICAL EXAM: Vitals:   04/30/21 0821  BP: 122/82  Pulse: (!) 105  SpO2: 98%   General: No acute distress Head:  Normocephalic/atraumatic Skin/Extremities: No rash, no edema Neurological Exam: alert and oriented to person, place, and time. No aphasia or dysarthria. Fund of knowledge is appropriate.  Recent and remote memory are intact.  Attention and concentration are normal.  MMSE 29/30 MMSE - Mini Mental State Exam 04/30/2021 01/06/2018  Not completed: - (No Data)  Orientation to time 5 -  Orientation to Place 5 -  Registration 3 -  Attention/ Calculation 5 -  Recall 2 -  Language- name 2 objects 2 -  Language- repeat 1 -  Language- follow 3 step command 3 -  Language- read & follow direction 1 -  Write a sentence 1 -  Copy design 1 -  Total score 29  -    Cranial nerves: Pupils equal, round. Extraocular movements intact with no  nystagmus. Visual fields full.  No facial asymmetry.  Motor: Bulk and tone normal, muscle strength 5/5 throughout except for 4/5 left hip flexion reporting pain, no pronator drift.  Reflexes +1 throughout. Finger to nose testing intact.  Gait slow and cautious with walker, no ataxia   IMPRESSION: This is an 86 yo RH woman with a history of hypertension, hyperlipidemia, anxiety, chronic pain, chronic migraine, right parietal meningioma, with new onset status epilepticus last June 2022. She presented with confusion, then became unresponsive with left gaze deviation. EEG showed generalized maximal bifrontal periodic epileptiform discharges at 3 Hz consistent with status epilepticus, then EEG changes resolved followed by diffuse slowing with additional right hemisphere slowing. MRI brain x 2 no acute changes, no change in small 1cm right parietal meningioma. Etiology of seizure unclear, it is unclear if the very small right parietal meningioma is contributing. She had a UTI at that time as well. She has been seizure-free and and doing well cognitively as well since hospital discharge. MMSE today 29/30. Continue Levetiracetam 500mg  BID. She was advised to keep a calendar of the "electrical sensations" if these resolve with LEV initiation. She reports Gabapentin was for possible seizures, but review of records indicate it was for back pain with radiculopathy. Migraines controlled on current regimen of magnesium, B1, B6, prn Maxalt, refills sent. Continue physical therapy, increased hydration, close supervision. She does not drive. Follow-up in 6 months, they know to call for any changes.    Thank you for allowing me to participate in her care.  Please do not hesitate to call for any questions or concerns.   Ellouise Newer, M.D.   CC: Dr. Martinique

## 2021-04-30 NOTE — Progress Notes (Signed)
Broken back in 2021  Broken femur in 2021

## 2021-05-01 DIAGNOSIS — M5459 Other low back pain: Secondary | ICD-10-CM | POA: Diagnosis not present

## 2021-05-01 DIAGNOSIS — R41841 Cognitive communication deficit: Secondary | ICD-10-CM | POA: Diagnosis not present

## 2021-05-01 DIAGNOSIS — M25562 Pain in left knee: Secondary | ICD-10-CM | POA: Diagnosis not present

## 2021-05-01 DIAGNOSIS — M6281 Muscle weakness (generalized): Secondary | ICD-10-CM | POA: Diagnosis not present

## 2021-05-01 DIAGNOSIS — R262 Difficulty in walking, not elsewhere classified: Secondary | ICD-10-CM | POA: Diagnosis not present

## 2021-05-02 ENCOUNTER — Other Ambulatory Visit: Payer: Self-pay

## 2021-05-02 ENCOUNTER — Emergency Department (HOSPITAL_COMMUNITY)
Admission: EM | Admit: 2021-05-02 | Discharge: 2021-05-02 | Disposition: A | Discharge status: Home / Self Care | Payer: Medicare Other | Attending: Emergency Medicine | Admitting: Emergency Medicine

## 2021-05-02 ENCOUNTER — Emergency Department (HOSPITAL_COMMUNITY): Payer: Medicare Other

## 2021-05-02 DIAGNOSIS — R262 Difficulty in walking, not elsewhere classified: Secondary | ICD-10-CM | POA: Diagnosis not present

## 2021-05-02 DIAGNOSIS — R41841 Cognitive communication deficit: Secondary | ICD-10-CM | POA: Diagnosis not present

## 2021-05-02 DIAGNOSIS — I952 Hypotension due to drugs: Secondary | ICD-10-CM | POA: Insufficient documentation

## 2021-05-02 DIAGNOSIS — Z86011 Personal history of benign neoplasm of the brain: Secondary | ICD-10-CM | POA: Insufficient documentation

## 2021-05-02 DIAGNOSIS — R001 Bradycardia, unspecified: Secondary | ICD-10-CM | POA: Diagnosis not present

## 2021-05-02 DIAGNOSIS — M6281 Muscle weakness (generalized): Secondary | ICD-10-CM | POA: Diagnosis not present

## 2021-05-02 DIAGNOSIS — I10 Essential (primary) hypertension: Secondary | ICD-10-CM | POA: Diagnosis not present

## 2021-05-02 DIAGNOSIS — X58XXXA Exposure to other specified factors, initial encounter: Secondary | ICD-10-CM | POA: Insufficient documentation

## 2021-05-02 DIAGNOSIS — Z79899 Other long term (current) drug therapy: Secondary | ICD-10-CM | POA: Insufficient documentation

## 2021-05-02 DIAGNOSIS — T50905A Adverse effect of unspecified drugs, medicaments and biological substances, initial encounter: Secondary | ICD-10-CM | POA: Diagnosis not present

## 2021-05-02 DIAGNOSIS — M25562 Pain in left knee: Secondary | ICD-10-CM | POA: Diagnosis not present

## 2021-05-02 DIAGNOSIS — R531 Weakness: Secondary | ICD-10-CM | POA: Diagnosis not present

## 2021-05-02 DIAGNOSIS — T887XXA Unspecified adverse effect of drug or medicament, initial encounter: Secondary | ICD-10-CM | POA: Diagnosis not present

## 2021-05-02 DIAGNOSIS — R410 Disorientation, unspecified: Secondary | ICD-10-CM | POA: Diagnosis not present

## 2021-05-02 DIAGNOSIS — M5459 Other low back pain: Secondary | ICD-10-CM | POA: Diagnosis not present

## 2021-05-02 DIAGNOSIS — I959 Hypotension, unspecified: Secondary | ICD-10-CM | POA: Diagnosis not present

## 2021-05-02 LAB — I-STAT CHEM 8, ED
BUN: 15 mg/dL (ref 8–23)
Calcium, Ion: 1.09 mmol/L — ABNORMAL LOW (ref 1.15–1.40)
Chloride: 106 mmol/L (ref 98–111)
Creatinine, Ser: 1.1 mg/dL — ABNORMAL HIGH (ref 0.44–1.00)
Glucose, Bld: 109 mg/dL — ABNORMAL HIGH (ref 70–99)
HCT: 28 % — ABNORMAL LOW (ref 36.0–46.0)
Hemoglobin: 9.5 g/dL — ABNORMAL LOW (ref 12.0–15.0)
Potassium: 4.2 mmol/L (ref 3.5–5.1)
Sodium: 136 mmol/L (ref 135–145)
TCO2: 21 mmol/L — ABNORMAL LOW (ref 22–32)

## 2021-05-02 LAB — CBC WITH DIFFERENTIAL/PLATELET
Abs Immature Granulocytes: 0.01 K/uL (ref 0.00–0.07)
Basophils Absolute: 0 K/uL (ref 0.0–0.1)
Basophils Relative: 0 %
Eosinophils Absolute: 0.1 K/uL (ref 0.0–0.5)
Eosinophils Relative: 2 %
HCT: 29.7 % — ABNORMAL LOW (ref 36.0–46.0)
Hemoglobin: 9.6 g/dL — ABNORMAL LOW (ref 12.0–15.0)
Immature Granulocytes: 0 %
Lymphocytes Relative: 43 %
Lymphs Abs: 2 K/uL (ref 0.7–4.0)
MCH: 32.8 pg (ref 26.0–34.0)
MCHC: 32.3 g/dL (ref 30.0–36.0)
MCV: 101.4 fL — ABNORMAL HIGH (ref 80.0–100.0)
Monocytes Absolute: 0.3 K/uL (ref 0.1–1.0)
Monocytes Relative: 6 %
Neutro Abs: 2.3 K/uL (ref 1.7–7.7)
Neutrophils Relative %: 49 %
Platelets: 187 K/uL (ref 150–400)
RBC: 2.93 MIL/uL — ABNORMAL LOW (ref 3.87–5.11)
RDW: 13.2 % (ref 11.5–15.5)
WBC: 4.8 K/uL (ref 4.0–10.5)
nRBC: 0 % (ref 0.0–0.2)

## 2021-05-02 LAB — COMPREHENSIVE METABOLIC PANEL WITH GFR
ALT: 15 U/L (ref 0–44)
AST: 20 U/L (ref 15–41)
Albumin: 2.9 g/dL — ABNORMAL LOW (ref 3.5–5.0)
Alkaline Phosphatase: 36 U/L — ABNORMAL LOW (ref 38–126)
Anion gap: 8 (ref 5–15)
BUN: 15 mg/dL (ref 8–23)
CO2: 21 mmol/L — ABNORMAL LOW (ref 22–32)
Calcium: 8.6 mg/dL — ABNORMAL LOW (ref 8.9–10.3)
Chloride: 107 mmol/L (ref 98–111)
Creatinine, Ser: 1.1 mg/dL — ABNORMAL HIGH (ref 0.44–1.00)
GFR, Estimated: 50 mL/min — ABNORMAL LOW
Glucose, Bld: 107 mg/dL — ABNORMAL HIGH (ref 70–99)
Potassium: 4.3 mmol/L (ref 3.5–5.1)
Sodium: 136 mmol/L (ref 135–145)
Total Bilirubin: 0.4 mg/dL (ref 0.3–1.2)
Total Protein: 5 g/dL — ABNORMAL LOW (ref 6.5–8.1)

## 2021-05-02 LAB — LACTIC ACID, PLASMA
Lactic Acid, Venous: 1.3 mmol/L (ref 0.5–1.9)
Lactic Acid, Venous: 0.9 mmol/L (ref 0.5–1.9)

## 2021-05-02 LAB — TROPONIN I (HIGH SENSITIVITY)
Troponin I (High Sensitivity): 4 ng/L
Troponin I (High Sensitivity): 4 ng/L (ref <18)

## 2021-05-02 LAB — URINALYSIS, ROUTINE W REFLEX MICROSCOPIC
Bilirubin Urine: NEGATIVE
Glucose, UA: NEGATIVE mg/dL
Hgb urine dipstick: NEGATIVE
Ketones, ur: NEGATIVE mg/dL
Leukocytes,Ua: NEGATIVE
Nitrite: NEGATIVE
Protein, ur: NEGATIVE mg/dL
Specific Gravity, Urine: 1.012 (ref 1.005–1.030)
pH: 5 (ref 5.0–8.0)

## 2021-05-02 MED ORDER — SODIUM CHLORIDE 0.9 % IV BOLUS
1000.0000 mL | Freq: Once | INTRAVENOUS | Status: AC
  Administered 2021-05-02: 1000 mL via INTRAVENOUS

## 2021-05-02 MED ORDER — SODIUM CHLORIDE 0.9 % IV BOLUS
500.0000 mL | Freq: Once | INTRAVENOUS | Status: AC
  Administered 2021-05-02: 500 mL via INTRAVENOUS

## 2021-05-02 NOTE — ED Triage Notes (Signed)
Patient to ED from assisted living by EMS for evaluation of hypotension during physical therapy. Patient is A/O on arrival. Received 530ml NS by EMS. Patient was at PT then became hypotensive.

## 2021-05-02 NOTE — ED Provider Notes (Signed)
[facility] EMERGENCY DEPARTMENT Provider Note   CSN: [id number] Arrival date & time: 05/02/21  1523     History Chief Complaint  Patient presents with   Hypotension    Tracey Armstrong is a 86 y.o. female.  The history is provided by the patient, the EMS personnel, a caregiver and medical records.  Tracey Armstrong is a 86 y.o. female who presents to the Emergency Department complaining of hypotension. Level V caveat due to confusion. She presents the emergency department by EMS for evaluation of hypotension that started today. She is in [facility] assisted living facility. She does have caregivers that stay with her from 9 AM to the afternoon. She was reported to be normal at 1:15 PM. When she went to physical therapy today she was found to be lethargic compared to baseline in her supine blood pressure was 90/60. When she went to sit she was at 60/40. EMS reports blood pressures of 70s over 50s after receiving 500 mL of normal saline. Patient does self administer her medications. She denies any changes to her medications and states that she would not have taken them incorrectly. She does report recently being treated for urinary tract infection. She is unsure all of her medications. She denies any fevers, chest pain, shortness of breath, Donald pain, nausea, vomiting. EMS reports a similar episode about one month ago. Patient states that she has had at least three similar episodes in the past.    Past Medical History:  Diagnosis Date   Anxiety    Arthritis    Chronic insomnia 03/30/2015   Chronic low back pain 12/02/2016   Depression    GERD (gastroesophageal reflux disease)    Hiatal hernia    High cholesterol    Hypertelorism    Hypertension    Meningioma (HCC)    Migraine     Patient Active Problem List   Diagnosis Date Noted   Urinary tract infection due to ESBL Klebsiella 49/70/2637   Acute metabolic encephalopathy 85/88/5027   Hypertensive urgency  04/09/2021   Lactic acidosis 04/09/2021   Lab test positive for detection of COVID-19 virus 04/09/2021   Abnormal urinalysis 04/09/2021   Lumbar radiculopathy 02/06/2021   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   Synovial cyst of lumbar facet joint 04/05/2020   Spinal stenosis of lumbar region with neurogenic claudication 04/05/2020   Dehydration 04/05/2020   Closed wedge compression fracture of L1 vertebra ([place]) 04/04/2020   Ambulatory dysfunction 04/04/2020   Intractable low back pain 04/04/2020   L1 vertebral fracture ([place]) 04/04/2020   Urine, incontinence, stress female 02/07/2019   Knee osteoarthritis 02/01/2018   Iron deficiency anemia 10/28/2017   Rhinitis, allergic 07/27/2017   GERD without esophagitis 06/16/2017   Chronic pain disorder 06/16/2017   Vitamin D deficiency, unspecified 05/25/2017   Hyperlipidemia 05/25/2017   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   Pain 02/26/2017   Chronic low back pain 12/02/2016   Abdominal pain 03/26/2016   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   Peptic esophagitis 03/26/2016   Neuralgia neuritis, sciatic nerve 03/26/2016   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     Family History  Problem Relation Age of Onset   Migraines Father    Stroke Father    Heart Problems Mother    Epilepsy Brother     Social History   Tobacco Use   Smoking status: Never    Smokeless tobacco: Never  Vaping Use   Vaping Use: Never used  Substance Use Topics   Alcohol use: No   Drug use: No    Home Medications Prior to Admission medications   Medication Sig Start Date End Date Taking? Authorizing Provider  ALPRAZolam (XANAX) 1 MG tablet TAKE 1/2 TABLET BY MOUTH AT NOON AND 1 TABLET BY MOUTH EVERY NIGHT AT BEDTIME Patient taking differently: Take 0.5-1 mg by mouth See admin instructions. TAKE 1/2 TABLET BY MOUTH AT NOON AND 1 TABLET BY MOUTH EVERY NIGHT AT BEDTIME 04/17/21  Yes Martinique, [name], MD  Cyanocobalamin (VITAMIN B-12 PO) Take 1 tablet by mouth daily with lunch.   Yes [provider]  Dextran 70-Hypromellose (ARTIFICIAL TEARS PF OP) Place 1-2 drops into both eyes See admin instructions. Instill 1-2 drops into both eyes at bedtime and an additional 2-3 times a day as needed for dryness   Yes [provider]  diphenhydramine-acetaminophen (TYLENOL PM) 25-500 MG TABS tablet Take 1.5 tablets by mouth at bedtime.   Yes [provider]  gabapentin (NEURONTIN) 300 MG capsule Take 1 capsule (300 mg total) by mouth 2 (two) times daily. 12/12/20  Yes Martinique, [name], MD  levETIRAcetam (KEPPRA) 500 MG tablet Take 1 tablet (500 mg total) by mouth 2 (two) times daily. 04/30/21 07/29/21 Yes Cameron Sprang, MD  Magnesium 400 MG TABS Take 400 mg by mouth at bedtime.   Yes [provider]  Melatonin 10 MG TABS Take 10 mg by mouth at bedtime as needed (for sleep).   Yes [provider]  omeprazole (PRILOSEC OTC) 20 MG tablet Take 20 mg by mouth daily before breakfast.   Yes [provider]  oxybutynin (DITROPAN-XL) 10 MG 24 hr tablet TAKE 1 TABLET(10 MG) BY MOUTH AT BEDTIME Patient taking differently: Take 10 mg by mouth at bedtime. 02/26/21  Yes Martinique, [name], MD  POTASSIUM PO Take 1 tablet by mouth daily with lunch.   Yes [provider]  pyridOXINE (VITAMIN B-6) 100 MG tablet Take 400 mg by mouth daily with  lunch.   Yes [provider]  riboflavin (VITAMIN B-2) 100 MG TABS tablet Take 400 mg by mouth daily with lunch.   Yes [provider]  tiZANidine (ZANAFLEX) 4 MG tablet Hold while taking Ciprofloxacin Patient taking differently: Take 4 mg by mouth in the morning and at bedtime. 04/14/21  Yes Mariel Aloe, MD  tretinoin (RETIN-A) 0.1 % cream APPLY TO AFFECTED AREA EVERY DAY AT BEDTIME Patient taking differently: Apply 1 application topically at bedtime. 09/22/18  Yes Martinique, [name], MD  verapamil (VERELAN PM) 180 MG 24 hr capsule TAKE 1 CAPSULE(180 MG) BY MOUTH DAILY Patient taking differently: Take 180 mg by mouth daily. 02/06/21  Yes Martinique, [name], MD  Vitamin D, Ergocalciferol, (DRISDOL) 1.25 MG (50000 UNIT) CAPS capsule TAKE 1 CAPSULE BY MOUTH EVERY 2 WEEKS 10/05/20  Yes Martinique, [name], MD  antiseptic oral rinse (BIOTENE) LIQD Use 3  mls 3-4 times per day for dry mouth. Do not swallow. Patient not taking: Reported on 05/02/2021 12/26/20   Martinique, [name], MD  docusate sodium (COLACE) 100 MG capsule Take 1 capsule (100 mg total) by mouth 2 (two) times daily. Patient not taking: Reported on 05/02/2021 06/06/20   Amin, Jeanella Flattery, MD  MAXALT-MLT 10 MG disintegrating tablet DISSOLVE ONE TABLET BY MOUTH AS NEEDED HEADACHE. Patient not taking: No sig reported 04/30/21   Cameron Sprang, MD    Allergies    Dihydrotachysterol, Trazodone and nefazodone, Valium [diazepam], Citalopram, and Other  Review of Systems   Review of Systems  All other systems reviewed and are negative.  Physical Exam Updated Vital Signs BP 130/69 (BP Location: Right Arm)   Pulse 92   Temp 97.8 F (36.6 C) (Oral)   Resp 16   Ht 5\' 2"  (1.575 m)   Wt 56.7 kg   SpO2 98%   BMI 22.86 kg/m   Physical Exam Vitals and nursing note reviewed.  Constitutional:      Appearance: She is well-developed.  HENT:     Head: Normocephalic and atraumatic.  Cardiovascular:     Rate and Rhythm: Regular rhythm.  Bradycardia present.     Heart sounds: No murmur heard. Pulmonary:     Effort: Pulmonary effort is normal. No respiratory distress.     Breath sounds: Normal breath sounds.  Abdominal:     Palpations: Abdomen is soft.     Tenderness: There is no abdominal tenderness. There is no guarding or rebound.  Musculoskeletal:        General: No swelling or tenderness.  Skin:    General: Skin is warm and dry.  Neurological:     Mental Status: She is alert and oriented to person, place, and time.     Comments: Dysarthric speech. Generalized weakness.  Psychiatric:        Behavior: Behavior normal.    ED Results / Procedures / Treatments   Labs (all labs ordered are listed, but only abnormal results are displayed) Labs Reviewed  COMPREHENSIVE METABOLIC PANEL - Abnormal; Notable for the following components:      Result Value   CO2 21 (*)    Glucose, Bld 107 (*)    Creatinine, Ser 1.10 (*)    Calcium 8.6 (*)    Total Protein 5.0 (*)    Albumin 2.9 (*)    Alkaline Phosphatase 36 (*)    GFR, Estimated 50 (*)    All other components within normal limits  CBC WITH DIFFERENTIAL/PLATELET - Abnormal; Notable for the following components:   RBC 2.93 (*)    Hemoglobin 9.6 (*)    HCT 29.7 (*)    MCV 101.4 (*)    All other components within normal limits  URINALYSIS, ROUTINE W REFLEX MICROSCOPIC - Abnormal; Notable for the following components:   Color, Urine AMBER (*)    APPearance HAZY (*)    All other components within normal limits  I-STAT CHEM 8, ED - Abnormal; Notable for the following components:   Creatinine, Ser 1.10 (*)    Glucose, Bld 109 (*)    Calcium, Ion 1.09 (*)    TCO2 21 (*)    Hemoglobin 9.5 (*)    HCT 28.0 (*)    All other components within normal limits  URINE CULTURE  LACTIC ACID, PLASMA  LACTIC ACID, PLASMA  TROPONIN I (HIGH SENSITIVITY)  TROPONIN I (HIGH SENSITIVITY)    EKG EKG Interpretation  Date/Time:  Thursday May 02 2021 15:37:14 EDT Ventricular  Rate:  59 PR Interval:  154 QRS Duration: 90 QT Interval:  443 QTC Calculation: 439 R Axis:   85 Text Interpretation: Sinus rhythm Borderline right axis deviation ST elevation, consider inferior injury Confirmed by Quintella Reichert [phone]) on 05/02/2021 5:33:18 PM  Radiology DG Chest Port 1 View  Result Date: 05/02/2021 CLINICAL DATA:  Hypotension. EXAM: PORTABLE CHEST 1 VIEW COMPARISON:  04/12/2021 FINDINGS: 1616 hours. Rightward patient rotation. The lungs are clear without focal pneumonia, edema, pneumothorax or pleural effusion. Interstitial markings are diffusely coarsened with chronic features. Cardiopericardial silhouette is at upper limits of normal for size. Bones are diffusely demineralized. Telemetry leads overlie the chest. IMPRESSION: Chronic interstitial changes without acute cardiopulmonary findings. Electronically Signed   By: Misty Stanley M.D.   On: 05/02/2021 17:33    Procedures Procedures   Medications Ordered in ED Medications  sodium chloride 0.9 % bolus 1,000 mL (0 mLs Intravenous Stopped 05/02/21 1713)  sodium chloride 0.9 % bolus 500 mL (0 mLs Intravenous Stopped 05/02/21 1920)    ED Course  I have reviewed the triage vital signs and the nursing notes.  Pertinent labs & imaging results that were available during my care of the patient were reviewed by me and considered in my medical decision making (see chart for details).    MDM Rules/Calculators/A&P                         patient here for evaluation of hypotension and altered mental status from her facility. She was significantly hypotensive on ED presentation. She was treated with IV fluids on reassessment her hypotension resolved and she is asymptomatic. On reassessment patient appears more energetic. Additional history obtained by patient son after her initial arrival. It appears that patient accidentally took one of her tizanidine pills today, which had been discontinued. Suspect that this contributed to her  episode of hypotension. Recommend discontinuing this medication. Discussed outpatient follow-up and return precautions. Presentation is not consistent with ACS, PE, sepsis, CVA.  Final Clinical Impression(s) / ED Diagnoses Final diagnoses:  Hypotension due to drugs  Adverse effect of drug, initial encounter    Rx / DC Orders ED Discharge Orders     None        Quintella Reichert, MD 05/02/21 2359

## 2021-05-02 NOTE — Discharge Instructions (Addendum)
Stop taking your tizanidine. Please follow-up with your doctor in the next 3 to 5 days. Get rechecked sooner if you have new or concerning symptoms.

## 2021-05-03 DIAGNOSIS — M6281 Muscle weakness (generalized): Secondary | ICD-10-CM | POA: Diagnosis not present

## 2021-05-03 DIAGNOSIS — M5459 Other low back pain: Secondary | ICD-10-CM | POA: Diagnosis not present

## 2021-05-03 DIAGNOSIS — R41841 Cognitive communication deficit: Secondary | ICD-10-CM | POA: Diagnosis not present

## 2021-05-03 DIAGNOSIS — R262 Difficulty in walking, not elsewhere classified: Secondary | ICD-10-CM | POA: Diagnosis not present

## 2021-05-03 DIAGNOSIS — M25562 Pain in left knee: Secondary | ICD-10-CM | POA: Diagnosis not present

## 2021-05-06 DIAGNOSIS — Z1159 Encounter for screening for other viral diseases: Secondary | ICD-10-CM | POA: Diagnosis not present

## 2021-05-06 DIAGNOSIS — Z20828 Contact with and (suspected) exposure to other viral communicable diseases: Secondary | ICD-10-CM | POA: Diagnosis not present

## 2021-05-07 ENCOUNTER — Telehealth: Payer: Self-pay | Admitting: Neurology

## 2021-05-07 ENCOUNTER — Other Ambulatory Visit: Payer: Self-pay | Admitting: Family Medicine

## 2021-05-07 DIAGNOSIS — M6281 Muscle weakness (generalized): Secondary | ICD-10-CM | POA: Diagnosis not present

## 2021-05-07 DIAGNOSIS — M25562 Pain in left knee: Secondary | ICD-10-CM | POA: Diagnosis not present

## 2021-05-07 DIAGNOSIS — R262 Difficulty in walking, not elsewhere classified: Secondary | ICD-10-CM | POA: Diagnosis not present

## 2021-05-07 DIAGNOSIS — R41841 Cognitive communication deficit: Secondary | ICD-10-CM | POA: Diagnosis not present

## 2021-05-07 DIAGNOSIS — M5459 Other low back pain: Secondary | ICD-10-CM | POA: Diagnosis not present

## 2021-05-07 LAB — URINE CULTURE: Culture: NO GROWTH

## 2021-05-07 NOTE — Telephone Encounter (Signed)
On her visit, they reported taking it twice a day, would reduce to 1 every night for 1 week, then stop. On review of Dr. Doug Sou notes, it was also prescribed for her back pain. Thanks

## 2021-05-07 NOTE — Telephone Encounter (Signed)
Pt's son called in and left a message wanting to have someone give him a call to clarify how to take the patient's medication.

## 2021-05-07 NOTE — Telephone Encounter (Signed)
Spoke with pt son he is asking if they can DC pt Gabapentin? He stated that she is very groggy thinking that the Gabapentin is the cause

## 2021-05-08 NOTE — Telephone Encounter (Signed)
Spoke with pt son On her visit, they reported taking it twice a day, would reduce to 1 every night for 1 week, then stop. On review of Dr. Doug Sou notes, it was also prescribed for her back pain they will talk to Dr Martinique to let them know they are stopping the Gabapentin

## 2021-05-09 DIAGNOSIS — R262 Difficulty in walking, not elsewhere classified: Secondary | ICD-10-CM | POA: Diagnosis not present

## 2021-05-09 DIAGNOSIS — M6281 Muscle weakness (generalized): Secondary | ICD-10-CM | POA: Diagnosis not present

## 2021-05-09 DIAGNOSIS — M5459 Other low back pain: Secondary | ICD-10-CM | POA: Diagnosis not present

## 2021-05-09 DIAGNOSIS — R41841 Cognitive communication deficit: Secondary | ICD-10-CM | POA: Diagnosis not present

## 2021-05-09 DIAGNOSIS — M25562 Pain in left knee: Secondary | ICD-10-CM | POA: Diagnosis not present

## 2021-05-10 DIAGNOSIS — R41841 Cognitive communication deficit: Secondary | ICD-10-CM | POA: Diagnosis not present

## 2021-05-10 DIAGNOSIS — M25562 Pain in left knee: Secondary | ICD-10-CM | POA: Diagnosis not present

## 2021-05-10 DIAGNOSIS — M5459 Other low back pain: Secondary | ICD-10-CM | POA: Diagnosis not present

## 2021-05-10 DIAGNOSIS — R262 Difficulty in walking, not elsewhere classified: Secondary | ICD-10-CM | POA: Diagnosis not present

## 2021-05-10 DIAGNOSIS — M6281 Muscle weakness (generalized): Secondary | ICD-10-CM | POA: Diagnosis not present

## 2021-05-13 DIAGNOSIS — Z20828 Contact with and (suspected) exposure to other viral communicable diseases: Secondary | ICD-10-CM | POA: Diagnosis not present

## 2021-05-13 DIAGNOSIS — Z1159 Encounter for screening for other viral diseases: Secondary | ICD-10-CM | POA: Diagnosis not present

## 2021-05-14 DIAGNOSIS — R41841 Cognitive communication deficit: Secondary | ICD-10-CM | POA: Diagnosis not present

## 2021-05-14 DIAGNOSIS — M6281 Muscle weakness (generalized): Secondary | ICD-10-CM | POA: Diagnosis not present

## 2021-05-14 DIAGNOSIS — R262 Difficulty in walking, not elsewhere classified: Secondary | ICD-10-CM | POA: Diagnosis not present

## 2021-05-14 DIAGNOSIS — M25562 Pain in left knee: Secondary | ICD-10-CM | POA: Diagnosis not present

## 2021-05-14 DIAGNOSIS — M5459 Other low back pain: Secondary | ICD-10-CM | POA: Diagnosis not present

## 2021-05-16 DIAGNOSIS — M5459 Other low back pain: Secondary | ICD-10-CM | POA: Diagnosis not present

## 2021-05-16 DIAGNOSIS — M25562 Pain in left knee: Secondary | ICD-10-CM | POA: Diagnosis not present

## 2021-05-16 DIAGNOSIS — M6281 Muscle weakness (generalized): Secondary | ICD-10-CM | POA: Diagnosis not present

## 2021-05-16 DIAGNOSIS — R262 Difficulty in walking, not elsewhere classified: Secondary | ICD-10-CM | POA: Diagnosis not present

## 2021-05-16 DIAGNOSIS — R41841 Cognitive communication deficit: Secondary | ICD-10-CM | POA: Diagnosis not present

## 2021-05-20 DIAGNOSIS — Z20828 Contact with and (suspected) exposure to other viral communicable diseases: Secondary | ICD-10-CM | POA: Diagnosis not present

## 2021-05-20 DIAGNOSIS — Z1159 Encounter for screening for other viral diseases: Secondary | ICD-10-CM | POA: Diagnosis not present

## 2021-05-21 DIAGNOSIS — M6281 Muscle weakness (generalized): Secondary | ICD-10-CM | POA: Diagnosis not present

## 2021-05-21 DIAGNOSIS — R41841 Cognitive communication deficit: Secondary | ICD-10-CM | POA: Diagnosis not present

## 2021-05-21 DIAGNOSIS — M25562 Pain in left knee: Secondary | ICD-10-CM | POA: Diagnosis not present

## 2021-05-21 DIAGNOSIS — R262 Difficulty in walking, not elsewhere classified: Secondary | ICD-10-CM | POA: Diagnosis not present

## 2021-05-21 DIAGNOSIS — M5459 Other low back pain: Secondary | ICD-10-CM | POA: Diagnosis not present

## 2021-05-23 DIAGNOSIS — M6281 Muscle weakness (generalized): Secondary | ICD-10-CM | POA: Diagnosis not present

## 2021-05-23 DIAGNOSIS — M25562 Pain in left knee: Secondary | ICD-10-CM | POA: Diagnosis not present

## 2021-05-23 DIAGNOSIS — R262 Difficulty in walking, not elsewhere classified: Secondary | ICD-10-CM | POA: Diagnosis not present

## 2021-05-23 DIAGNOSIS — R41841 Cognitive communication deficit: Secondary | ICD-10-CM | POA: Diagnosis not present

## 2021-05-23 DIAGNOSIS — M5459 Other low back pain: Secondary | ICD-10-CM | POA: Diagnosis not present

## 2021-05-24 ENCOUNTER — Telehealth: Payer: Self-pay | Admitting: Physical Medicine and Rehabilitation

## 2021-05-24 NOTE — Telephone Encounter (Signed)
Patient called needing an appointment with Dr Ernestina Patches for the pain in her back and left hip. Patient asked if she can get both of the injection on the same day. The number to contact patient is [phone]

## 2021-05-27 ENCOUNTER — Telehealth: Payer: Self-pay | Admitting: Physical Medicine and Rehabilitation

## 2021-05-27 DIAGNOSIS — R41841 Cognitive communication deficit: Secondary | ICD-10-CM | POA: Diagnosis not present

## 2021-05-27 DIAGNOSIS — M25562 Pain in left knee: Secondary | ICD-10-CM | POA: Diagnosis not present

## 2021-05-27 DIAGNOSIS — M5459 Other low back pain: Secondary | ICD-10-CM | POA: Diagnosis not present

## 2021-05-27 DIAGNOSIS — R262 Difficulty in walking, not elsewhere classified: Secondary | ICD-10-CM | POA: Diagnosis not present

## 2021-05-27 DIAGNOSIS — Z20828 Contact with and (suspected) exposure to other viral communicable diseases: Secondary | ICD-10-CM | POA: Diagnosis not present

## 2021-05-27 DIAGNOSIS — M6281 Muscle weakness (generalized): Secondary | ICD-10-CM | POA: Diagnosis not present

## 2021-05-27 NOTE — Telephone Encounter (Signed)
Left L5-S1 IL 02/14/2021. Please advise.

## 2021-05-27 NOTE — Telephone Encounter (Signed)
See previous message

## 2021-05-27 NOTE — Telephone Encounter (Signed)
Scheduled for 8/22 at 1400 with driver and no blood thinners.

## 2021-05-27 NOTE — Telephone Encounter (Signed)
Pt would like an appt either Monday, Wednesday, or Friday morning for her back.

## 2021-05-29 DIAGNOSIS — R41841 Cognitive communication deficit: Secondary | ICD-10-CM | POA: Diagnosis not present

## 2021-05-29 DIAGNOSIS — M5459 Other low back pain: Secondary | ICD-10-CM | POA: Diagnosis not present

## 2021-05-29 DIAGNOSIS — R262 Difficulty in walking, not elsewhere classified: Secondary | ICD-10-CM | POA: Diagnosis not present

## 2021-05-29 DIAGNOSIS — M25562 Pain in left knee: Secondary | ICD-10-CM | POA: Diagnosis not present

## 2021-05-29 DIAGNOSIS — M6281 Muscle weakness (generalized): Secondary | ICD-10-CM | POA: Diagnosis not present

## 2021-05-30 DIAGNOSIS — M25562 Pain in left knee: Secondary | ICD-10-CM | POA: Diagnosis not present

## 2021-05-30 DIAGNOSIS — M5459 Other low back pain: Secondary | ICD-10-CM | POA: Diagnosis not present

## 2021-05-30 DIAGNOSIS — R41841 Cognitive communication deficit: Secondary | ICD-10-CM | POA: Diagnosis not present

## 2021-05-30 DIAGNOSIS — M6281 Muscle weakness (generalized): Secondary | ICD-10-CM | POA: Diagnosis not present

## 2021-05-30 DIAGNOSIS — R262 Difficulty in walking, not elsewhere classified: Secondary | ICD-10-CM | POA: Diagnosis not present

## 2021-06-03 DIAGNOSIS — M25562 Pain in left knee: Secondary | ICD-10-CM | POA: Diagnosis not present

## 2021-06-03 DIAGNOSIS — R262 Difficulty in walking, not elsewhere classified: Secondary | ICD-10-CM | POA: Diagnosis not present

## 2021-06-03 DIAGNOSIS — M5459 Other low back pain: Secondary | ICD-10-CM | POA: Diagnosis not present

## 2021-06-03 DIAGNOSIS — R41841 Cognitive communication deficit: Secondary | ICD-10-CM | POA: Diagnosis not present

## 2021-06-03 DIAGNOSIS — M6281 Muscle weakness (generalized): Secondary | ICD-10-CM | POA: Diagnosis not present

## 2021-06-05 DIAGNOSIS — M6281 Muscle weakness (generalized): Secondary | ICD-10-CM | POA: Diagnosis not present

## 2021-06-05 DIAGNOSIS — R41841 Cognitive communication deficit: Secondary | ICD-10-CM | POA: Diagnosis not present

## 2021-06-05 DIAGNOSIS — R262 Difficulty in walking, not elsewhere classified: Secondary | ICD-10-CM | POA: Diagnosis not present

## 2021-06-05 DIAGNOSIS — M25562 Pain in left knee: Secondary | ICD-10-CM | POA: Diagnosis not present

## 2021-06-05 DIAGNOSIS — M5459 Other low back pain: Secondary | ICD-10-CM | POA: Diagnosis not present

## 2021-06-06 ENCOUNTER — Other Ambulatory Visit: Payer: Self-pay | Admitting: Family Medicine

## 2021-06-06 DIAGNOSIS — R41841 Cognitive communication deficit: Secondary | ICD-10-CM | POA: Diagnosis not present

## 2021-06-06 DIAGNOSIS — M25562 Pain in left knee: Secondary | ICD-10-CM | POA: Diagnosis not present

## 2021-06-06 DIAGNOSIS — R262 Difficulty in walking, not elsewhere classified: Secondary | ICD-10-CM | POA: Diagnosis not present

## 2021-06-06 DIAGNOSIS — M48061 Spinal stenosis, lumbar region without neurogenic claudication: Secondary | ICD-10-CM

## 2021-06-06 DIAGNOSIS — M5459 Other low back pain: Secondary | ICD-10-CM | POA: Diagnosis not present

## 2021-06-06 DIAGNOSIS — M6281 Muscle weakness (generalized): Secondary | ICD-10-CM | POA: Diagnosis not present

## 2021-06-10 ENCOUNTER — Ambulatory Visit: Payer: Self-pay

## 2021-06-10 ENCOUNTER — Encounter: Payer: Self-pay | Admitting: Physical Medicine and Rehabilitation

## 2021-06-10 ENCOUNTER — Ambulatory Visit (INDEPENDENT_AMBULATORY_CARE_PROVIDER_SITE_OTHER): Payer: Medicare Other | Admitting: Physical Medicine and Rehabilitation

## 2021-06-10 ENCOUNTER — Ambulatory Visit: Payer: Medicare Other | Admitting: Physician Assistant

## 2021-06-10 ENCOUNTER — Other Ambulatory Visit: Payer: Self-pay

## 2021-06-10 VITALS — BP 103/70 | HR 97

## 2021-06-10 DIAGNOSIS — M5416 Radiculopathy, lumbar region: Secondary | ICD-10-CM | POA: Diagnosis not present

## 2021-06-10 DIAGNOSIS — M5459 Other low back pain: Secondary | ICD-10-CM | POA: Diagnosis not present

## 2021-06-10 DIAGNOSIS — M6281 Muscle weakness (generalized): Secondary | ICD-10-CM | POA: Diagnosis not present

## 2021-06-10 DIAGNOSIS — R41841 Cognitive communication deficit: Secondary | ICD-10-CM | POA: Diagnosis not present

## 2021-06-10 DIAGNOSIS — M25562 Pain in left knee: Secondary | ICD-10-CM | POA: Diagnosis not present

## 2021-06-10 DIAGNOSIS — R262 Difficulty in walking, not elsewhere classified: Secondary | ICD-10-CM | POA: Diagnosis not present

## 2021-06-10 DIAGNOSIS — Z8616 Personal history of COVID-19: Secondary | ICD-10-CM | POA: Diagnosis not present

## 2021-06-10 MED ORDER — BETAMETHASONE SOD PHOS & ACET 6 (3-3) MG/ML IJ SUSP
12.0000 mg | Freq: Once | INTRAMUSCULAR | Status: AC
  Administered 2021-06-10: 12 mg

## 2021-06-10 NOTE — Progress Notes (Signed)
Pt state lower back pain that travels to her left hip. Pt state walking, standing and sitting makes the pain worse. Pt state she takes pain meds to ease her pain. Pt has hx of inj on 02/14/21 it helped.  Numeric Pain Rating Scale and Functional Assessment Average Pain 7   In the last MONTH (on 0-10 scale) has pain interfered with the following?  1. General activity like being  able to carry out your everyday physical activities such as walking, climbing stairs, carrying groceries, or moving a chair?  Rating(10)   +Driver, -BT, -Dye Allergies.

## 2021-06-10 NOTE — Patient Instructions (Signed)

## 2021-06-12 ENCOUNTER — Encounter: Payer: Self-pay | Admitting: Physician Assistant

## 2021-06-12 ENCOUNTER — Ambulatory Visit (INDEPENDENT_AMBULATORY_CARE_PROVIDER_SITE_OTHER): Payer: Medicare Other | Admitting: Physician Assistant

## 2021-06-12 ENCOUNTER — Ambulatory Visit (INDEPENDENT_AMBULATORY_CARE_PROVIDER_SITE_OTHER): Payer: Medicare Other

## 2021-06-12 ENCOUNTER — Ambulatory Visit: Payer: Self-pay

## 2021-06-12 DIAGNOSIS — M1712 Unilateral primary osteoarthritis, left knee: Secondary | ICD-10-CM

## 2021-06-12 DIAGNOSIS — M1711 Unilateral primary osteoarthritis, right knee: Secondary | ICD-10-CM | POA: Diagnosis not present

## 2021-06-12 MED ORDER — LIDOCAINE HCL 1 % IJ SOLN
3.0000 mL | INTRAMUSCULAR | Status: AC | PRN
  Administered 2021-06-12: 3 mL

## 2021-06-12 MED ORDER — METHYLPREDNISOLONE ACETATE 40 MG/ML IJ SUSP
40.0000 mg | INTRAMUSCULAR | Status: AC | PRN
  Administered 2021-06-12: 40 mg via INTRA_ARTICULAR

## 2021-06-12 NOTE — Progress Notes (Signed)
   Procedure Note  Patient: Tracey Armstrong             Date of Birth: Apr 10, 1935           MRN: [id number]             Visit Date: 06/12/2021 HPI: Tracey Armstrong is well-known to Dr. Trevor Mace service comes in today requesting cortisone injection of bilateral knees.  She unfortunately had a fall and sustained a left femur fracture on 06/02/2020 and underwent an IM nailing distal femur fracture.  She is in formal therapy trying to build up strength in both legs.  Review of systems no fevers chills.  Nondiabetic.  Physical exam: Bilateral knees no abnormal warmth erythema or effusion.  Well-healed surgical incisions left knee.  No gross instability valgus varus stressing of either knee.  Good range of motion of both knees.  Procedures: Visit Diagnoses:  1. Primary osteoarthritis of left knee   2. Primary osteoarthritis of right knee     Large Joint Inj: L knee on 06/12/2021 2:18 PM Indications: pain Details: anterolateral approach Medications: 3 mL lidocaine 1 %; 40 mg methylPREDNISolone acetate 40 MG/ML Consent was given by the patient. Immediately prior to procedure a time out was called to verify the correct patient, procedure, equipment, support staff and site/side marked as required. Patient was prepped and draped in the usual sterile fashion.    Large Joint Inj: L greater trochanter on 06/12/2021 2:19 PM Indications: pain Details: 22 G 1.5 in needle, lateral approach  Arthrogram: No  Medications: 3 mL lidocaine 1 %; 40 mg methylPREDNISolone acetate 40 MG/ML Outcome: tolerated well, no immediate complications Procedure, treatment alternatives, risks and benefits explained, specific risks discussed. Consent was given by the patient. Immediately prior to procedure a time out was called to verify the correct patient, procedure, equipment, support staff and site/side marked as required. Patient was prepped and draped in the usual sterile fashion.   Radiographs: Right knee: Knee is well  located.  No acute fractures.  Moderate patellofemoral arthritis.  Slight narrowing lateral joint line.  Otherwise knee is well-preserved. Left knee 2 views: No acute fracture.  There is healing distal femoral shaft fracture with presence of an IM nail.  Moderate patellofemoral arthritis.  Mild narrowing medial lateral joint line.  Otherwise no subluxation dislocation knee.  Plan: She understands to wait at least 3 months between cortisone injections both knees.  She will follow-up as needed.  Questions encouraged and answered.

## 2021-06-13 ENCOUNTER — Telehealth: Payer: Self-pay | Admitting: Neurology

## 2021-06-13 NOTE — Telephone Encounter (Signed)
Pt needs a call about her medicine. She seems to be confused a little regarding her keppra. Stated the hospital had put her on it, but in generic form. She has been having bad headaches, nauseous and dizzy. [phone] Or [phone] she wants to talk about another medicine. Said she cant continue going like this. She isn't sleeping. She cant sit up, she has not done anything but lay in bed.

## 2021-06-14 ENCOUNTER — Other Ambulatory Visit: Payer: Self-pay

## 2021-06-14 ENCOUNTER — Other Ambulatory Visit: Payer: Self-pay | Admitting: Neurology

## 2021-06-14 MED ORDER — NEURONTIN 300 MG PO CAPS: ORAL_CAPSULE | ORAL | 6 refills | Status: DC

## 2021-06-14 NOTE — Telephone Encounter (Signed)
Would make one change at a time. Gabapentin helps with headaches, stopping this may have caused recurrence. Restart Neurontin '300mg'$  qhs, monitor for improvement in headaches, make sure no increase in confusion. If no change in symptoms in 2-3 weeks, we will switch to brand Keppra. Rx sent for Neurontin, thanks

## 2021-06-14 NOTE — Telephone Encounter (Signed)
Patient called and left a message stating she has questions about her seizure medicine.

## 2021-06-14 NOTE — Telephone Encounter (Signed)
Can you pls call her son [name] and see what is going on? She had been doing really well for a month on this medication, if she is this sick, there may be something else going on and she may need to go to ER if she cannot even sit up. Thanks

## 2021-06-14 NOTE — Telephone Encounter (Signed)
Called patient to se if she would be willing to try the Gabapentin at a lesser dose of 300 mg she is convinced that she is having adverse reaction to all generic brands of medication she will take the Neurontin but not gabapentin and she is really set on taking the Keppra

## 2021-06-14 NOTE — Telephone Encounter (Signed)
Called patients son to gather more information and he feels like it is a matter of his mom thinking that the levetiracetam being the generic brand is causing her to have these side effects. She is not having the physical side effects in fact he son said physical therapy is helping her tremendously an that she is much more mobile. She also has stopped taking her Gabapentin . The effects she is having is more of the headache and loss of appetite.

## 2021-06-17 ENCOUNTER — Telehealth: Payer: Self-pay

## 2021-06-17 DIAGNOSIS — R41841 Cognitive communication deficit: Secondary | ICD-10-CM | POA: Diagnosis not present

## 2021-06-17 DIAGNOSIS — Z20828 Contact with and (suspected) exposure to other viral communicable diseases: Secondary | ICD-10-CM | POA: Diagnosis not present

## 2021-06-17 DIAGNOSIS — M5459 Other low back pain: Secondary | ICD-10-CM | POA: Diagnosis not present

## 2021-06-17 DIAGNOSIS — M6281 Muscle weakness (generalized): Secondary | ICD-10-CM | POA: Diagnosis not present

## 2021-06-17 DIAGNOSIS — R262 Difficulty in walking, not elsewhere classified: Secondary | ICD-10-CM | POA: Diagnosis not present

## 2021-06-17 DIAGNOSIS — M25562 Pain in left knee: Secondary | ICD-10-CM | POA: Diagnosis not present

## 2021-06-17 NOTE — Telephone Encounter (Signed)
New message   Pending  Status Sent to Plan today  Shayleigh Crossno Key: [id number] Need help? Call us at [phone]  Drug Neurontin '300MG'$  capsules Form [employer] [place] Medicare Part D Electronic Request Form (CB)

## 2021-06-17 NOTE — Telephone Encounter (Signed)
Pt called back no answer left a voice mail to call the office back. When she calls back need to let her know Would make one change at a time. Gabapentin helps with headaches, stopping this may have caused recurrence. Restart Neurontin '300mg'$  qhs, monitor for improvement in headaches, make sure no increase in confusion. If no change in symptoms in 2-3 weeks, we will switch to brand Keppra. Rx sent for Neurontin

## 2021-06-17 NOTE — Telephone Encounter (Signed)
F/u  Outcome Approved today  Effective from 06/17/2021 through 06/17/2022.  Tracey Armstrong Key: [id number] help? Call us at [phone]

## 2021-06-19 MED ORDER — KEPPRA 500 MG PO TABS: 500.0000 mg | ORAL_TABLET | Freq: Two times a day (BID) | ORAL | 11 refills | Status: DC

## 2021-06-19 NOTE — Telephone Encounter (Signed)
Pt son would like a call back to discuss whats going on. [phone]

## 2021-06-19 NOTE — Telephone Encounter (Signed)
Pt son called and informed that we will try to get keppra approved and that a RX was sent in, also advised to call us and let us know if Tracey Armstrong has any trouble while being of of neurontin

## 2021-06-19 NOTE — Telephone Encounter (Signed)
We can try to get the brand Keppra approved. I'll send in Rx, thanks

## 2021-06-19 NOTE — Telephone Encounter (Signed)
Spoke with pt son informed him Gabapentin helps with headaches, stopping this may have caused recurrence. Restart Neurontin '300mg'$  qhs, monitor for improvement in headaches, make sure no increase in confusion. If no change in symptoms in 2-3 weeks, we will switch to brand Keppra.  Pt son stated his mom has not taken Gabapentin In about 3 weeks and asking if she can stay off of it and just change to the name brand Keppra. Pt told her son she has had a few head aches but not bad, still wants to stay off of the Gabapentin / Neurontin  Will call her son with an update

## 2021-06-20 DIAGNOSIS — R41841 Cognitive communication deficit: Secondary | ICD-10-CM | POA: Diagnosis not present

## 2021-06-20 DIAGNOSIS — M6281 Muscle weakness (generalized): Secondary | ICD-10-CM | POA: Diagnosis not present

## 2021-06-24 DIAGNOSIS — Z8616 Personal history of COVID-19: Secondary | ICD-10-CM | POA: Diagnosis not present

## 2021-06-25 ENCOUNTER — Telehealth: Payer: Self-pay

## 2021-06-25 NOTE — Telephone Encounter (Signed)
F/u    Outcome Approved today  Effective from 06/25/2021 through 06/25/2022.  Drug Keppra '500MG'$  tablets Form [employer] [place] Medicare Part D Electronic Request Form (CB) Original Claim Info [id number]

## 2021-06-25 NOTE — Telephone Encounter (Signed)
New message   Ethelyn Quinto Key: [id number] - Rx #: [id number] Need help? Call us at [phone] Status Sent to Home '500MG'$  tablets Form [employer] Cross [place] Medicare Part D Electronic Request Form (CB) Original Claim Info [phone]

## 2021-06-26 NOTE — Procedures (Signed)
Lumbar Epidural Steroid Injection - Interlaminar Approach with Fluoroscopic Guidance  Patient: Tracey Armstrong      Date of Birth: 1935/05/08 MRN: [id number] PCP: Martinique, [name], MD      Visit Date: 06/10/2021   Universal Protocol:     Consent Given By: the patient  Position: PRONE  Additional Comments: Vital signs were monitored before and after the procedure. Patient was prepped and draped in the usual sterile fashion. The correct patient, procedure, and site was verified.   Injection Procedure Details:   Procedure diagnoses: Lumbar radiculopathy [M54.16]   Meds Administered:  Meds ordered this encounter  Medications   betamethasone acetate-betamethasone sodium phosphate (CELESTONE) injection 12 mg     Laterality: Left  Location/Site:  L5-S1  Needle: 3.5 in., 20 ga. Tuohy  Needle Placement: Paramedian epidural  Findings:   -Comments: Excellent flow of contrast into the epidural space.  Procedure Details: Using a paramedian approach from the side mentioned above, the region overlying the inferior lamina was localized under fluoroscopic visualization and the soft tissues overlying this structure were infiltrated with 4 ml. of 1% Lidocaine without Epinephrine. The Tuohy needle was inserted into the epidural space using a paramedian approach.   The epidural space was localized using loss of resistance along with counter oblique bi-planar fluoroscopic views.  After negative aspirate for air, blood, and CSF, a 2 ml. volume of Isovue-250 was injected into the epidural space and the flow of contrast was observed. Radiographs were obtained for documentation purposes.    The injectate was administered into the level noted above.   Additional Comments:  The patient tolerated the procedure well Dressing: 2 x 2 sterile gauze and Band-Aid    Post-procedure details: Patient was observed during the procedure. Post-procedure instructions were reviewed.  Patient left the clinic  in stable condition.

## 2021-06-26 NOTE — Progress Notes (Signed)
Tracey Armstrong - 86 y.o. female MRN [id number]  Date of birth: 04/02/1935  Office Visit Note: Visit Date: 06/10/2021 PCP: Martinique, [name], MD Referred by: Martinique, [name], MD  Subjective: Chief Complaint  Patient presents with   Lower Back - Pain   Left Hip - Pain   HPI:  Tracey Armstrong is a 86 y.o. female who comes in today for planned repeat Left L5-S1  Lumbar Interlaminar epidural steroid injection with fluoroscopic guidance.  The patient has failed conservative care including home exercise, medications, time and activity modification.  This injection will be diagnostic and hopefully therapeutic.  Please see requesting physician notes for further details and justification. Patient received more than 50% pain relief from prior injection.  Patient with poor insight into her spine condition with fairly severe multifactorial stenosis at L4-5.  She gets intermittent greater trochanteric injections as well by Benita Stabile, P.A.-C.  Hard to know which 1 of those really helps her but we are limiting the injections to just a few times in a year.  Referring: Benita Stabile, PA-C   ROS Otherwise per HPI.  Assessment & Plan: Visit Diagnoses:    ICD-10-CM   1. Lumbar radiculopathy  M54.16 XR C-ARM NO REPORT    Epidural Steroid injection    betamethasone acetate-betamethasone sodium phosphate (CELESTONE) injection 12 mg      Plan: No additional findings.   Meds & Orders:  Meds ordered this encounter  Medications   betamethasone acetate-betamethasone sodium phosphate (CELESTONE) injection 12 mg    Orders Placed This Encounter  Procedures   XR C-ARM NO REPORT   Epidural Steroid injection    Follow-up: Return if symptoms worsen or fail to improve.   Procedures: No procedures performed  Lumbar Epidural Steroid Injection - Interlaminar Approach with Fluoroscopic Guidance  Patient: Tracey Armstrong      Date of Birth: November 30, 1934 MRN: [id number] PCP: Martinique, [name], MD      Visit Date: 06/10/2021    Universal Protocol:     Consent Given By: the patient  Position: PRONE  Additional Comments: Vital signs were monitored before and after the procedure. Patient was prepped and draped in the usual sterile fashion. The correct patient, procedure, and site was verified.   Injection Procedure Details:   Procedure diagnoses: Lumbar radiculopathy [M54.16]   Meds Administered:  Meds ordered this encounter  Medications   betamethasone acetate-betamethasone sodium phosphate (CELESTONE) injection 12 mg     Laterality: Left  Location/Site:  L5-S1  Needle: 3.5 in., 20 ga. Tuohy  Needle Placement: Paramedian epidural  Findings:   -Comments: Excellent flow of contrast into the epidural space.  Procedure Details: Using a paramedian approach from the side mentioned above, the region overlying the inferior lamina was localized under fluoroscopic visualization and the soft tissues overlying this structure were infiltrated with 4 ml. of 1% Lidocaine without Epinephrine. The Tuohy needle was inserted into the epidural space using a paramedian approach.   The epidural space was localized using loss of resistance along with counter oblique bi-planar fluoroscopic views.  After negative aspirate for air, blood, and CSF, a 2 ml. volume of Isovue-250 was injected into the epidural space and the flow of contrast was observed. Radiographs were obtained for documentation purposes.    The injectate was administered into the level noted above.   Additional Comments:  The patient tolerated the procedure well Dressing: 2 x 2 sterile gauze and Band-Aid    Post-procedure details: Patient was observed during the procedure. Post-procedure  instructions were reviewed.  Patient left the clinic in stable condition.   Clinical History: CT LUMBAR SPINE WITHOUT CONTRAST   TECHNIQUE:  Multidetector CT imaging of the lumbar spine was performed without  intravenous contrast administration. Multiplanar CT  image  reconstructions were also generated.   COMPARISON:  Lumbar spine radiographs 04/04/2020 and MRI 09/09/2018   FINDINGS:  Segmentation: 5 lumbar type vertebrae.   Alignment: Mild thoracolumbar levoscoliosis. Grade 1 anterolisthesis  of L4 on L5, unchanged from the prior MRI.   Vertebrae: L1 superior endplate compression fracture with 30%  vertebral body height loss and 4 mm retropulsion of the superior  endplate. A fracture line remains visible although there is  surrounding sclerosis. Hemangioma in the L4 vertebral body.   Paraspinal and other soft tissues: Aortic atherosclerosis without  aneurysm.   Disc levels: Disc bulging and posterior element hypertrophy result  in chronic mild-to-moderate spinal stenosis at L3-4 and moderate to  severe spinal stenosis at L4-5. No significant spinal stenosis  related to mild L1 retropulsion.   IMPRESSION:  1. Possibly subacute L1 compression fracture with 30% height loss  and 4 mm retropulsion. No associated spinal stenosis.  2. Chronic mild-to-moderate spinal stenosis at L3-4 and moderate to  severe spinal stenosis at L4-5.  3. Aortic Atherosclerosis (ICD10-I70.0).    Electronically Signed    By: Logan Bores M.D.    On: 04/04/2020 17:39 ---   MRI LUMBAR SPINE WITHOUT CONTRAST   TECHNIQUE: Multiplanar, multisequence MR imaging of the lumbar spine was performed. No intravenous contrast was administered.   COMPARISON: Lumbar MRI 06/20/2016   FINDINGS: Segmentation: Normal   Alignment: Mild retrolisthesis T12-L1, L1-2, L2-3. 5 mm anterolisthesis L4-5 has progressed in the interval.   Vertebrae: Normal bone marrow. Negative for fracture or mass.   Conus medullaris and cauda equina: Conus extends to the L2 level. Conus and cauda equina appear normal.   Paraspinal and other soft tissues: Negative for paraspinous mass or fluid collection. Retroperitoneal structures negative.   Disc levels:   T12-L1: Mild disc bulging  without stenosis   L1-2: Small central disc protrusion unchanged. Mild disc and facet degeneration without significant stenosis   L2-3: Mild disc and moderate facet degeneration. No significant stenosis.   L3-4: Disc degeneration with diffuse disc bulging. Moderate facet hypertrophy. Mild spinal stenosis and mild subarticular stenosis bilaterally   L4-5: 4 mm anterolisthesis has progressed in the interval. Advanced facet degeneration. 4 mm synovial cyst on the left projecting into the subarticular zone and causing subarticular stenosis on the left. Moderate spinal stenosis. Moderate subarticular stenosis on the left could affect the left L5 nerve root.   L5-S1: Mild disc degeneration. Moderate facet degeneration. Small left-sided synovial cyst has improved in the interval. No significant stenosis.   IMPRESSION: Small central disc protrusion L1-2 unchanged   Mild spinal stenosis and mild subarticular stenosis bilaterally at L3-4   Progressive anterolisthesis L4-5. 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   Improvement in left-sided synovial cyst L5-S1.     Electronically Signed By: Franchot Gallo M.D. On: 09/09/2018 10:49     Objective:  VS:  HT:    WT:   BMI:     BP:103/70  HR:97bpm  TEMP: ( )  RESP:  Physical Exam Vitals and nursing note reviewed.  Constitutional:      General: She is not in acute distress.    Appearance: Normal appearance. She is not ill-appearing.  HENT:     Head: Normocephalic and atraumatic.     Right Ear: External ear normal.     Left Ear: External ear normal.  Eyes:     Extraocular Movements: Extraocular movements intact.  Cardiovascular:     Rate and Rhythm: Normal rate.     Pulses: Normal pulses.  Pulmonary:     Effort: Pulmonary effort is normal. No respiratory distress.  Abdominal:     General: There is no distension.     Palpations: Abdomen is soft.   Musculoskeletal:        General: Tenderness present.     Cervical back: Neck supple.     Right lower leg: No edema.     Left lower leg: No edema.     Comments: Patient has good distal strength with no pain over the greater trochanters.  No clonus or focal weakness.  Patient does walk with a cane.  She is slow to rise from a seated position.  She does have back pain with extension and facet loading.  Skin:    Findings: No erythema, lesion or rash.  Neurological:     General: No focal deficit present.     Mental Status: She is alert and oriented to person, place, and time.     Sensory: No sensory deficit.     Motor: No weakness or abnormal muscle tone.     Coordination: Coordination normal.     Gait: Gait abnormal.  Psychiatric:        Mood and Affect: Mood normal.        Behavior: Behavior normal.     Imaging: No results found.

## 2021-06-27 DIAGNOSIS — M6281 Muscle weakness (generalized): Secondary | ICD-10-CM | POA: Diagnosis not present

## 2021-06-27 DIAGNOSIS — R41841 Cognitive communication deficit: Secondary | ICD-10-CM | POA: Diagnosis not present

## 2021-07-01 DIAGNOSIS — Z20828 Contact with and (suspected) exposure to other viral communicable diseases: Secondary | ICD-10-CM | POA: Diagnosis not present

## 2021-07-04 DIAGNOSIS — M6281 Muscle weakness (generalized): Secondary | ICD-10-CM | POA: Diagnosis not present

## 2021-07-04 DIAGNOSIS — R41841 Cognitive communication deficit: Secondary | ICD-10-CM | POA: Diagnosis not present

## 2021-07-06 NOTE — Progress Notes (Signed)
Tracey Armstrong - 86 y.o. female MRN [id number]  Date of birth: 09/21/1935  Office Visit Note: Visit Date: 02/06/2021 PCP: Martinique, [name], MD Referred by: Martinique, [name], MD  Subjective: Chief Complaint  Patient presents with   Lower Back - Pain   Left Leg - Pain   HPI: Tracey Armstrong is a 86 y.o. female who comes in today For evaluation and management of chronic worsening severe left hip and leg pain which she rates his average pain of 10 out of 10 constant and aching pain worse with standing and walking but also sitting for prolonged periods.  Patient is well-known to [place] through Dr. Jean Rosenthal and his assistant Benita Stabile, P.A.-C.  I last saw her in December of last year and completed transforaminal epidural steroid injection with only minimal relief.  She has had this history of hip and leg pain that she feels like is in her hip and leg but has really been her lumbar spine but has been difficult trying to convince her of the problem with lumbar stenosis and how that manifest and leg pain.  Nonetheless since have seen her she has had intertrochanteric femur fracture.  This is been treated by Rod Can, MD at [facility].  She has had extensive physical therapy and she did have some evaluation and injection by Dr. Suella Broad.  She denies any new focal weakness.  She is ambulating with a walker.  She denies any right-sided complaints.  No focal weakness no bowel or bladder changes etc.  Her case is complicated by multiple medical issues and drug intolerances.  This pain is recalcitrant really over the years to medication treatment and physical therapy and injections.  She has had an updated CT scan of the lumbar spine since have seen her.  This did show severe stenosis of the spine at L4-5.  Prior MRI showed moderate to severe.  Review of Systems  Musculoskeletal:  Positive for back pain, falls and joint pain.  All other systems reviewed and are negative. Otherwise per  HPI.  Assessment & Plan: Visit Diagnoses:    ICD-10-CM   1. Lumbar radiculopathy  M54.16     2. Spinal stenosis of lumbar region with neurogenic claudication  M48.062     3. Closed wedge compression fracture of L1 vertebra, sequela  S32.010S     4. Chronic pain syndrome  G89.4        Plan: Findings:  Chronic, severe and recalcitrant low back and left hip and leg pain consistent more with neurogenic claudication and radiculopathy from the lumbar spine standpoint.  There could be some level of pain from posterior and trochanteric bursitis etc.  I think the neck step though would be interlaminar injection diagnostically.  This was done at L5-S1 and just see if doing it from an interlaminar approach would help more than the transforaminal injection.  We will try to get notes from Dr. Nelva Bush to see what he was looking at.  She will continue with her physical therapy and home exercise.  I did talk at length about the closed wedge compression fracture that was noted.  We did talk about how there was no stenosis related to that.  We did talk about the healing process of vertebral fractures.  I did spend over 45 minutes discussing her lumbar spine and hip issues and falls as well as compression fractures.   Meds & Orders: No orders of the defined types were placed in this encounter.  No orders of  the defined types were placed in this encounter.   Follow-up: Return for Left L5-S1 interlaminar dural steroid injection..   Procedures: No procedures performed      Clinical History: CT LUMBAR SPINE WITHOUT CONTRAST   TECHNIQUE:  Multidetector CT imaging of the lumbar spine was performed without  intravenous contrast administration. Multiplanar CT image  reconstructions were also generated.   COMPARISON:  Lumbar spine radiographs 04/04/2020 and MRI 09/09/2018   FINDINGS:  Segmentation: 5 lumbar type vertebrae.   Alignment: Mild thoracolumbar levoscoliosis. Grade 1 anterolisthesis  of L4 on L5,  unchanged from the prior MRI.   Vertebrae: L1 superior endplate compression fracture with 30%  vertebral body height loss and 4 mm retropulsion of the superior  endplate. A fracture line remains visible although there is  surrounding sclerosis. Hemangioma in the L4 vertebral body.   Paraspinal and other soft tissues: Aortic atherosclerosis without  aneurysm.   Disc levels: Disc bulging and posterior element hypertrophy result  in chronic mild-to-moderate spinal stenosis at L3-4 and moderate to  severe spinal stenosis at L4-5. No significant spinal stenosis  related to mild L1 retropulsion.   IMPRESSION:  1. Possibly subacute L1 compression fracture with 30% height loss  and 4 mm retropulsion. No associated spinal stenosis.  2. Chronic mild-to-moderate spinal stenosis at L3-4 and moderate to  severe spinal stenosis at L4-5.  3. Aortic Atherosclerosis (ICD10-I70.0).    Electronically Signed    By: Logan Bores M.D.    On: 04/04/2020 17:39 ---   MRI LUMBAR SPINE WITHOUT CONTRAST   TECHNIQUE: Multiplanar, multisequence MR imaging of the lumbar spine was performed. No intravenous contrast was administered.   COMPARISON: Lumbar MRI 06/20/2016   FINDINGS: Segmentation: Normal   Alignment: Mild retrolisthesis T12-L1, L1-2, L2-3. 5 mm anterolisthesis L4-5 has progressed in the interval.   Vertebrae: Normal bone marrow. Negative for fracture or mass.   Conus medullaris and cauda equina: Conus extends to the L2 level. Conus and cauda equina appear normal.   Paraspinal and other soft tissues: Negative for paraspinous mass or fluid collection. Retroperitoneal structures negative.   Disc levels:   T12-L1: Mild disc bulging without stenosis   L1-2: Small central disc protrusion unchanged. Mild disc and facet degeneration without significant stenosis   L2-3: Mild disc and moderate facet degeneration. No significant stenosis.   L3-4: Disc degeneration with diffuse disc  bulging. Moderate facet hypertrophy. Mild spinal stenosis and mild subarticular stenosis bilaterally   L4-5: 4 mm anterolisthesis has progressed in the interval. Advanced facet degeneration. 4 mm synovial cyst on the left projecting into the subarticular zone and causing subarticular stenosis on the left. Moderate spinal stenosis. Moderate subarticular stenosis on the left could affect the left L5 nerve root.   L5-S1: Mild disc degeneration. Moderate facet degeneration. Small left-sided synovial cyst has improved in the interval. No significant stenosis.   IMPRESSION: Small central disc protrusion L1-2 unchanged   Mild spinal stenosis and mild subarticular stenosis bilaterally at L3-4   Progressive anterolisthesis L4-5. 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   Improvement in left-sided synovial cyst L5-S1.     Electronically Signed By: Franchot Gallo M.D. On: 09/09/2018 10:49   She reports that she has never smoked. She has never used smokeless tobacco.  Recent Labs    12/12/20 1051 04/09/21 0447 04/12/21 0421  HGBA1C 5.5 5.6 5.2    Objective:  VS:  HT:    WT:  BMI:     BP:131/80  HR:71bpm  TEMP: ( )  RESP:  Physical Exam Vitals and nursing note reviewed.  Constitutional:      General: She is not in acute distress.    Appearance: Normal appearance. She is not ill-appearing.  HENT:     Head: Normocephalic and atraumatic.     Right Ear: External ear normal.     Left Ear: External ear normal.  Eyes:     Extraocular Movements: Extraocular movements intact.  Cardiovascular:     Rate and Rhythm: Normal rate.     Pulses: Normal pulses.  Pulmonary:     Effort: Pulmonary effort is normal. No respiratory distress.  Abdominal:     General: There is no distension.     Palpations: Abdomen is soft.  Musculoskeletal:        General: Tenderness present.     Cervical back: Neck supple.     Right  lower leg: Edema present.     Left lower leg: Edema present.     Comments: Patient has good distal strength with pain over the greater trochanters.  No clonus or focal weakness.  Ambulates slowly with a forward flexed lumbar spine.  She has concordant low back pain with extension and facet loading.  She has no pain with hip rotation.  Skin:    Findings: No erythema, lesion or rash.  Neurological:     General: No focal deficit present.     Mental Status: She is alert and oriented to person, place, and time.     Sensory: No sensory deficit.     Motor: No weakness or abnormal muscle tone.     Coordination: Coordination normal.     Gait: Gait abnormal.  Psychiatric:        Mood and Affect: Mood normal.        Behavior: Behavior normal.    Ortho Exam  Imaging: No results found.  Past Medical/Family/Surgical/Social History: Medications & Allergies reviewed per EMR, new medications updated. 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 claudication 04/05/2020   Dehydration 04/05/2020   Closed wedge compression fracture of L1 vertebra (HCC) 04/04/2020   Ambulatory dysfunction 04/04/2020   Intractable low back pain 04/04/2020   L1 vertebral fracture ([place]) 04/04/2020   Urine, incontinence, stress female 02/07/2019   Knee osteoarthritis 02/01/2018   Iron deficiency anemia 10/28/2017   Rhinitis, allergic 07/27/2017   GERD without esophagitis 06/16/2017   Chronic pain disorder 06/16/2017   Vitamin D deficiency, unspecified 05/25/2017    Hyperlipidemia 05/25/2017   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   Pain 02/26/2017   Chronic low back pain 12/02/2016   Abdominal pain 03/26/2016   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   Peptic esophagitis 03/26/2016   Neuralgia neuritis, sciatic nerve 03/26/2016   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[place]) 06/20/2015   Chronic insomnia 03/30/2015   Chronic migraine 01/10/2015   Depression 04/20/2014   Generalized anxiety disorder 04/20/2014   Chronic back pain 04/20/2014   Chronic daily headache 04/20/2014   Essential hypertension 04/20/2014   Migraine headache 04/19/2014  Past Medical History:  Diagnosis Date   Anxiety    Arthritis    Chronic insomnia 03/30/2015   Chronic low back pain 12/02/2016   Depression    GERD (gastroesophageal reflux disease)    Hiatal hernia    High cholesterol    Hypertelorism    Hypertension    Meningioma (HCC)    Migraine    Family History  Problem Relation Age of Onset   Migraines Father    Stroke Father    Heart Problems Mother    Epilepsy Brother    Past Surgical History:  Procedure Laterality Date   FEMUR IM NAIL Left 06/02/2020   Procedure: INTRAMEDULLARY (IM) RETROGRADE FEMORAL NAILING - LEFT;  Surgeon: Rod Can, MD;  Location: [facility];  Service: Orthopedics;  Laterality: Left;   STOMACH SURGERY     Social History   Occupational History   Occupation: Retired  Tobacco Use   Smoking status: Never   Smokeless tobacco: Never  Vaping Use   Vaping Use: Never used  Substance and Sexual Activity   Alcohol use: No   Drug use: No   Sexual activity: Not Currently

## 2021-07-08 DIAGNOSIS — Z20828 Contact with and (suspected) exposure to other viral communicable diseases: Secondary | ICD-10-CM | POA: Diagnosis not present

## 2021-07-11 DIAGNOSIS — M6281 Muscle weakness (generalized): Secondary | ICD-10-CM | POA: Diagnosis not present

## 2021-07-11 DIAGNOSIS — R41841 Cognitive communication deficit: Secondary | ICD-10-CM | POA: Diagnosis not present

## 2021-07-15 ENCOUNTER — Telehealth: Payer: Self-pay | Admitting: Family Medicine

## 2021-07-15 DIAGNOSIS — F411 Generalized anxiety disorder: Secondary | ICD-10-CM

## 2021-07-15 DIAGNOSIS — Z8616 Personal history of COVID-19: Secondary | ICD-10-CM | POA: Diagnosis not present

## 2021-07-15 MED ORDER — ALPRAZOLAM 1 MG PO TABS: ORAL_TABLET | ORAL | 2 refills | Status: DC

## 2021-07-15 NOTE — Telephone Encounter (Signed)
PT called to advise that she needs a refill of their ALPRAZolam Duanne Moron) 1 MG tablet called in as they will be needing a refill before she is seen on the 5th of Oct. Please advise.

## 2021-07-16 NOTE — Telephone Encounter (Signed)
Rx was sent in yesterday by pcp.

## 2021-07-16 NOTE — Telephone Encounter (Signed)
PT called to advise that they need a refill of their ALPRAZolam (XANAX) 1 MG tablet to be called into the Rx on file.

## 2021-07-18 DIAGNOSIS — R41841 Cognitive communication deficit: Secondary | ICD-10-CM | POA: Diagnosis not present

## 2021-07-18 DIAGNOSIS — M6281 Muscle weakness (generalized): Secondary | ICD-10-CM | POA: Diagnosis not present

## 2021-07-19 ENCOUNTER — Ambulatory Visit: Payer: Medicare Other | Admitting: Family Medicine

## 2021-07-22 ENCOUNTER — Other Ambulatory Visit: Payer: Self-pay | Admitting: Orthopedic Surgery

## 2021-07-22 DIAGNOSIS — Z20828 Contact with and (suspected) exposure to other viral communicable diseases: Secondary | ICD-10-CM | POA: Diagnosis not present

## 2021-07-22 DIAGNOSIS — S72142D Displaced intertrochanteric fracture of left femur, subsequent encounter for closed fracture with routine healing: Secondary | ICD-10-CM | POA: Diagnosis not present

## 2021-07-22 DIAGNOSIS — S72145D Nondisplaced intertrochanteric fracture of left femur, subsequent encounter for closed fracture with routine healing: Secondary | ICD-10-CM

## 2021-07-24 ENCOUNTER — Ambulatory Visit: Payer: Medicare Other | Admitting: Family Medicine

## 2021-07-25 DIAGNOSIS — I700 Atherosclerosis of aorta: Secondary | ICD-10-CM | POA: Insufficient documentation

## 2021-07-25 NOTE — Progress Notes (Signed)
HPI: [name] is a 86 y.o. female, who is here today for follow up.   She was last seen on 04/17/21.  Since her last visit she has established with neurologist for seizure disorder and migraine headaches. Since hospitalization she has noted difficulty with short memory. She was treated for UTI during hospitalization. She is having "little" burning,intermittent. Oxybutynin XL 10 mg is helping with urinary frequency but she wonders if she can increase dose. Nocturia x 3-4. Negative for gross hematuria or decreased urine output.  She has seen her neurosurgeon for chronic lower back pain with radiculopathy. She is not longer on Zanaflex, thought is was elevating BP.  She is not taking Gabapentin, she thought she was supposed to stop it. Lower back pain and left hip pain are getting worse (lower back,hips,and LE's) since hospital discharge, Gabapentin 300 mg 2 tabs daily was helping. It seems like Rx for Gabapentin was sent but to take 1 cap at bedtime. She is planning on having left hip CT.  She is completed PT. Walking with a walker.  Hypertension:  Medications:Verapamil 180 mg daily. BP readings at home:"Normal" since she stopped Zanaflex.120's/80's. Side effects:None.  Negative for unusual or severe headache, visual changes, exertional chest pain, dyspnea,  focal weakness, or edema.  Lab Results  Component Value Date   CREATININE 1.10 (H) 05/02/2021   BUN 15 05/02/2021   NA 136 05/02/2021   K 4.2 05/02/2021   CL 106 05/02/2021   CO2 21 (L) 05/02/2021   Iron def anemia: She is on Ferrous sulfate 325 mg daily. Lab Results  Component Value Date   WBC 4.8 05/02/2021   HGB 9.5 (L) 05/02/2021   HCT 28.0 (L) 05/02/2021   MCV 101.4 (H) 05/02/2021   PLT 187 05/02/2021   Anxiety and insomnia: She is on Alprazolam 1 mg bid prn max 45 tabs/month. She feels like medication helps a lot, she would like to have 60 tabs per month instead 45 tab. She takes 1/2 tab in am  and 1 tab pm, since hospital discharge she feels like anxiety is worse and would like to be able to take an extra 1/2 tab if needed. She has been on this medication for many years and it has been well tolerated. She has not tolerated SSRI's or SNRI's.  25 OH vit D 52 in 11/2020. She is on Ergocalciferol 50,000 U q 2 weeks, resumed medication recently.  Aortic atherosclerosis seen on abdominal CT on 04/09/21. Aortic atherosclerosis without aneurysmal dilation. She has occasional periumbilical pain since she was in the hospital. Daily bowel movement daily, "good." Mg helps with constipation.  Lab Results  Component Value Date   CHOL 207 (H) 04/12/2021   HDL 70 04/12/2021   LDLCALC 121 (H) 04/12/2021   TRIG 80 04/12/2021   CHOLHDL 3.0 04/12/2021   Review of Systems  Constitutional:  Positive for activity change and fatigue. Negative for appetite change and fever.  HENT:  Negative for mouth sores, nosebleeds and sore throat.   Respiratory:  Negative for cough and wheezing.   Gastrointestinal:  Negative for abdominal pain, nausea and vomiting.       Negative for changes in bowel habits.  Musculoskeletal:  Positive for arthralgias, back pain and gait problem.  Neurological:  Negative for syncope, facial asymmetry and weakness.  Psychiatric/Behavioral:  Positive for sleep disturbance. The patient is nervous/anxious.   Rest of ROS, see pertinent positives sand negatives in HPI  Current Outpatient Medications on File Prior to  Visit  Medication Sig Dispense Refill   ALPRAZolam (XANAX) 1 MG tablet TAKE 1/2 TABLET BY MOUTH AT NOON AND 1 TABLET BY MOUTH EVERY NIGHT AT BEDTIME 45 tablet 2   antiseptic oral rinse (BIOTENE) LIQD Use 3 mls 3-4 times per day for dry mouth. Do not swallow. 237 mL 0   Cyanocobalamin (VITAMIN B-12 PO) Take 1 tablet by mouth daily with lunch.     Dextran 70-Hypromellose (ARTIFICIAL TEARS PF OP) Place 1-2 drops into both eyes See admin instructions. Instill 1-2 drops  into both eyes at bedtime and an additional 2-3 times a day as needed for dryness     diphenhydramine-acetaminophen (TYLENOL PM) 25-500 MG TABS tablet Take 1.5 tablets by mouth at bedtime.     docusate sodium (COLACE) 100 MG capsule Take 1 capsule (100 mg total) by mouth 2 (two) times daily. 10 capsule 0   KEPPRA 500 MG tablet Take 1 tablet (500 mg total) by mouth 2 (two) times daily. 60 tablet 11   Magnesium 400 MG TABS Take 400 mg by mouth at bedtime.     MAXALT-MLT 10 MG disintegrating tablet DISSOLVE ONE TABLET BY MOUTH AS NEEDED HEADACHE. 9 tablet 11   Melatonin 10 MG TABS Take 10 mg by mouth at bedtime as needed (for sleep).     NEURONTIN 300 MG capsule Take 1 capsule at bedtime 30 capsule 6   omeprazole (PRILOSEC OTC) 20 MG tablet Take 20 mg by mouth daily before breakfast.     oxybutynin (DITROPAN-XL) 10 MG 24 hr tablet TAKE 1 TABLET(10 MG) BY MOUTH AT BEDTIME (Patient taking differently: Take 10 mg by mouth at bedtime.) 90 tablet 2   POTASSIUM PO Take 1 tablet by mouth daily with lunch.     pyridOXINE (VITAMIN B-6) 100 MG tablet Take 400 mg by mouth daily with lunch.     riboflavin (VITAMIN B-2) 100 MG TABS tablet Take 400 mg by mouth daily with lunch.     tretinoin (RETIN-A) 0.1 % cream APPLY TO AFFECTED AREA EVERY DAY AT BEDTIME (Patient taking differently: Apply 1 application topically at bedtime.) 45 g 2   verapamil (VERELAN PM) 180 MG 24 hr capsule TAKE 1 CAPSULE(180 MG) BY MOUTH DAILY (Patient taking differently: Take 180 mg by mouth daily.) 90 capsule 3   Vitamin D, Ergocalciferol, (DRISDOL) 1.25 MG (50000 UNIT) CAPS capsule TAKE 1 CAPSULE BY MOUTH EVERY 2 WEEKS 7 capsule 2   No current facility-administered medications on file prior to visit.   Past Medical History:  Diagnosis Date   Anxiety    Arthritis    Chronic insomnia 03/30/2015   Chronic low back pain 12/02/2016   Depression    GERD (gastroesophageal reflux disease)    Hiatal hernia    High cholesterol     Hypertelorism    Hypertension    Meningioma (HCC)    Migraine    Allergies  Allergen Reactions   Dihydrotachysterol Other (See Comments)    Dihydrotachysterol is a form of vitamin D. Might be "DHT Intensol" (Name Brand)- Reaction unknown- tolerating 50,000 units of Vitamin D-2 (Drisdol) in 2022   Trazodone And Nefazodone Other (See Comments)    Worsens headaches   Valium [Diazepam] Other (See Comments)    headaches   Citalopram Other (See Comments)    Other reaction(s): Headache   Other Other (See Comments)    "States all antidepressants cause migraines." - tolerates Xanax     Social History   Socioeconomic History   Marital status:  Widowed    Spouse name: Not on file   Number of children: 4   Years of education: 30   Highest education level: Not on file  Occupational History   Occupation: Retired  Tobacco Use   Smoking status: Never   Smokeless tobacco: Never  Vaping Use   Vaping Use: Never used  Substance and Sexual Activity   Alcohol use: No   Drug use: No   Sexual activity: Not Currently  Other Topics Concern   Not on file  Social History Narrative   Lives in [place]      Patient drinks 1 glass of tea daily.      Patient is right handed.   Social Determinants of Health   Financial Resource Strain: Not on file  Food Insecurity: Not on file  Transportation Needs: Not on file  Physical Activity: Not on file  Stress: Not on file  Social Connections: Not on file   Vitals:   07/26/21 0912  BP: 120/70  Pulse: 85  Resp: 16  SpO2: 97%   Body mass index is 24.35 kg/m.  Physical Exam Vitals and nursing note reviewed.  Constitutional:      General: She is not in acute distress.    Appearance: She is well-developed.  HENT:     Head: Normocephalic and atraumatic.     Mouth/Throat:     Mouth: Mucous membranes are moist.     Pharynx: Oropharynx is clear.  Eyes:     Conjunctiva/sclera: Conjunctivae normal.  Cardiovascular:     Rate and Rhythm:  Normal rate and regular rhythm.     Heart sounds: No murmur heard. Pulmonary:     Effort: Pulmonary effort is normal. No respiratory distress.     Breath sounds: Normal breath sounds.  Abdominal:     Palpations: Abdomen is soft. There is no mass.     Tenderness: There is no abdominal tenderness.  Musculoskeletal:     Cervical back: Tenderness present.     Thoracic back: Scoliosis present.     Lumbar back: No tenderness.  Lymphadenopathy:     Cervical: No cervical adenopathy.  Skin:    General: Skin is warm and dry.     Findings: No erythema or rash.  Neurological:     General: No focal deficit present.     Mental Status: She is alert and oriented to person, place, and time.     Cranial Nerves: No cranial nerve deficit.     Gait: Gait normal.     Comments: Mildly unstable gait, assisted by a walker.  Psychiatric:        Mood and Affect: Mood is anxious.     Comments: Well groomed, good eye contact.   ASSESSMENT AND PLAN:  [name] was seen today for follow-up.  Orders Placed This Encounter  Procedures   Culture, Urine   Urinalysis, Routine w reflex microscopic   Dysuria She is not having symptoms at the time. She does ot feel like she can collect sample today, so would like to come back another day. Instructed about warning signs.  Urine, incontinence, stress female We cannot increase dose of Oxybutynin due to risk of side effects, she wants to try taking medication earlier during the day instead at night. If at the end she doe snot feel like it is helping, I recommended to discontinued. We discussed some side effects.   Vitamin D deficiency, unspecified Continue Ergocalciferol 50,000 U q 2 weeks. We will plan on checking  19 OH vot D next visit.  Iron deficiency anemia Continue Ferrous sulfate 325 mg daily. She refers to hold on labs, so will do CBC and iron next visit,   Essential hypertension BP adequately controlled. Continue current management:  Verapamil 180 mg daily. DASH/low salt diet to continue. Continue monitoring BP at home. Eye exam is current,  Atherosclerosis of aorta [facility]) We discussed imaging report. She is not on statin at this time, benefits discussed.  Generalized anxiety disorder She has not tolerated SSRI's and SNRI's. She can increase Alprazolam dose from 1.5 mg to 2 mg daily. We discussed side effects.  I spent a total of 50 minutes in both face to face and non face to face activities for this visit on the date of this encounter. During this time history was obtained and documented, examination was performed, prior labs/imaging reviewed, and assessment/plan discussed.  Return in about 3 months (around 10/26/2021).   Daymian Lill G. Martinique, MD  [facility]. [place] office.

## 2021-07-26 ENCOUNTER — Ambulatory Visit (INDEPENDENT_AMBULATORY_CARE_PROVIDER_SITE_OTHER): Payer: Medicare Other | Admitting: Family Medicine

## 2021-07-26 ENCOUNTER — Other Ambulatory Visit: Payer: Self-pay

## 2021-07-26 ENCOUNTER — Encounter: Payer: Self-pay | Admitting: Family Medicine

## 2021-07-26 VITALS — BP 120/70 | HR 85 | Resp 16 | Ht 62.0 in | Wt 133.1 lb

## 2021-07-26 DIAGNOSIS — I10 Essential (primary) hypertension: Secondary | ICD-10-CM | POA: Diagnosis not present

## 2021-07-26 DIAGNOSIS — F411 Generalized anxiety disorder: Secondary | ICD-10-CM

## 2021-07-26 DIAGNOSIS — I700 Atherosclerosis of aorta: Secondary | ICD-10-CM

## 2021-07-26 DIAGNOSIS — R300 Dysuria: Secondary | ICD-10-CM | POA: Diagnosis not present

## 2021-07-26 DIAGNOSIS — D509 Iron deficiency anemia, unspecified: Secondary | ICD-10-CM

## 2021-07-26 DIAGNOSIS — E559 Vitamin D deficiency, unspecified: Secondary | ICD-10-CM

## 2021-07-26 DIAGNOSIS — N393 Stress incontinence (female) (male): Secondary | ICD-10-CM | POA: Diagnosis not present

## 2021-07-26 NOTE — Assessment & Plan Note (Signed)
BP adequately controlled. Continue current management: Verapamil 180 mg daily. DASH/low salt diet to continue. Continue monitoring BP at home. Eye exam is current,

## 2021-07-26 NOTE — Assessment & Plan Note (Signed)
Continue Ergocalciferol 50,000 U q 2 weeks. We will plan on checking 25 OH vot D next visit.

## 2021-07-26 NOTE — Assessment & Plan Note (Signed)
We cannot increase dose of Oxybutynin due to risk of side effects, she wants to try taking medication earlier during the day instead at night. If at the end she doe snot feel like it is helping, I recommended to discontinued. We discussed some side effects.

## 2021-07-26 NOTE — Patient Instructions (Addendum)
A few things to remember from today's visit:  Iron deficiency anemia, unspecified iron deficiency anemia type  Essential hypertension  Generalized anxiety disorder  Atherosclerosis of aorta (HCC)  Dysuria - Plan: Urinalysis, Routine w reflex microscopic, Culture, Urine  If you need refills please call your pharmacy. Do not use My Chart to request refills or for acute issues that need immediate attention.   It seems like Dr Delice Lesch wanted you to continue Gabapentin but 1 cap at bedtime. Your neurologist is now managing your migraines.  Take Oxybutynin earlier, it may work better.We cannot increase dose.  We can increase Alprazolam to 60 tabs monthly but we need to be careful with this medication.  We will check anemia and vit D next visit. If you want to have cholesterol check next visit,please be sure you are fasting.  Please be sure medication list is accurate. If a new problem present, please set up appointment sooner than planned today.

## 2021-07-26 NOTE — Assessment & Plan Note (Signed)
Continue Ferrous sulfate 325 mg daily. She refers to hold on labs, so will do CBC and iron next visit,

## 2021-07-26 NOTE — Assessment & Plan Note (Signed)
We discussed imaging report. She is not on statin at this time, benefits discussed.

## 2021-07-29 ENCOUNTER — Telehealth: Payer: Self-pay | Admitting: Neurology

## 2021-07-29 DIAGNOSIS — Z20828 Contact with and (suspected) exposure to other viral communicable diseases: Secondary | ICD-10-CM | POA: Diagnosis not present

## 2021-07-29 NOTE — Telephone Encounter (Signed)
Pt called in stating she is wanting to find out if it's ok for her to take both the Keppra and the Neurontin at the same time at night? She is not taking the Neurontin until she finds out. She would like to take the Neurontin at noon if it is for nerve pain.

## 2021-07-29 NOTE — Telephone Encounter (Signed)
Ok to take at same time if she wants. Neurontin helps with nerve pain, if it does not make her drowsy, ok to take at noon. Thanks

## 2021-07-30 NOTE — Telephone Encounter (Signed)
Ok to take at same time if she wants. Neurontin helps with nerve pain, if it does not make her drowsy, ok to take at noon. She stated it does not make her sleepy. She will start taken it at noon on 07/31/21

## 2021-07-31 ENCOUNTER — Ambulatory Visit
Admission: RE | Admit: 2021-07-31 | Discharge: 2021-07-31 | Disposition: A | Discharge status: Home / Self Care | Payer: Medicare Other | Source: Ambulatory Visit | Attending: Orthopedic Surgery | Admitting: Orthopedic Surgery

## 2021-07-31 ENCOUNTER — Other Ambulatory Visit: Payer: Medicare Other

## 2021-07-31 ENCOUNTER — Ambulatory Visit: Payer: Medicare Other | Admitting: Family Medicine

## 2021-07-31 DIAGNOSIS — S72145D Nondisplaced intertrochanteric fracture of left femur, subsequent encounter for closed fracture with routine healing: Secondary | ICD-10-CM

## 2021-07-31 DIAGNOSIS — Z981 Arthrodesis status: Secondary | ICD-10-CM | POA: Diagnosis not present

## 2021-07-31 DIAGNOSIS — S72302A Unspecified fracture of shaft of left femur, initial encounter for closed fracture: Secondary | ICD-10-CM | POA: Diagnosis not present

## 2021-07-31 DIAGNOSIS — M25462 Effusion, left knee: Secondary | ICD-10-CM | POA: Diagnosis not present

## 2021-07-31 DIAGNOSIS — M1612 Unilateral primary osteoarthritis, left hip: Secondary | ICD-10-CM | POA: Diagnosis not present

## 2021-08-01 DIAGNOSIS — M6281 Muscle weakness (generalized): Secondary | ICD-10-CM | POA: Diagnosis not present

## 2021-08-01 DIAGNOSIS — R41841 Cognitive communication deficit: Secondary | ICD-10-CM | POA: Diagnosis not present

## 2021-08-05 DIAGNOSIS — M7062 Trochanteric bursitis, left hip: Secondary | ICD-10-CM | POA: Diagnosis not present

## 2021-08-07 DIAGNOSIS — R41841 Cognitive communication deficit: Secondary | ICD-10-CM | POA: Diagnosis not present

## 2021-08-07 DIAGNOSIS — M6281 Muscle weakness (generalized): Secondary | ICD-10-CM | POA: Diagnosis not present

## 2021-08-12 DIAGNOSIS — Z8616 Personal history of COVID-19: Secondary | ICD-10-CM | POA: Diagnosis not present

## 2021-08-19 DIAGNOSIS — Z8616 Personal history of COVID-19: Secondary | ICD-10-CM | POA: Diagnosis not present

## 2021-08-26 DIAGNOSIS — Z20828 Contact with and (suspected) exposure to other viral communicable diseases: Secondary | ICD-10-CM | POA: Diagnosis not present

## 2021-09-02 DIAGNOSIS — Z20822 Contact with and (suspected) exposure to covid-19: Secondary | ICD-10-CM | POA: Diagnosis not present

## 2021-09-16 DIAGNOSIS — Z20828 Contact with and (suspected) exposure to other viral communicable diseases: Secondary | ICD-10-CM | POA: Diagnosis not present

## 2021-09-16 DIAGNOSIS — Z1159 Encounter for screening for other viral diseases: Secondary | ICD-10-CM | POA: Diagnosis not present

## 2021-09-18 ENCOUNTER — Telehealth: Payer: Self-pay | Admitting: Physical Medicine and Rehabilitation

## 2021-09-18 NOTE — Telephone Encounter (Signed)
Pt calling to get an appt set up with Dr. Ernestina Patches. Pt stated that her ride service runs on Monday, Wednesday and Friday so any of those days are preferred. The best call back number is [phone].

## 2021-09-19 ENCOUNTER — Telehealth: Payer: Self-pay | Admitting: Physical Medicine and Rehabilitation

## 2021-09-19 NOTE — Telephone Encounter (Signed)
Patient called needing to schedule an appointment for an injection in her back. Patient can only on Monday, Wednesday or Friday mornings.  Patient asked if she can get an appointment as soon as possible. The number to contact patient is [phone]

## 2021-09-19 NOTE — Telephone Encounter (Signed)
Pt last injection Left L5-S1 IL was 06/10/2021.

## 2021-09-20 NOTE — Telephone Encounter (Signed)
scheduled

## 2021-09-23 ENCOUNTER — Ambulatory Visit (INDEPENDENT_AMBULATORY_CARE_PROVIDER_SITE_OTHER): Payer: Medicare Other | Admitting: Physician Assistant

## 2021-09-23 ENCOUNTER — Encounter: Payer: Self-pay | Admitting: Physician Assistant

## 2021-09-23 DIAGNOSIS — M1711 Unilateral primary osteoarthritis, right knee: Secondary | ICD-10-CM

## 2021-09-23 DIAGNOSIS — Z1159 Encounter for screening for other viral diseases: Secondary | ICD-10-CM | POA: Diagnosis not present

## 2021-09-23 DIAGNOSIS — Z20828 Contact with and (suspected) exposure to other viral communicable diseases: Secondary | ICD-10-CM | POA: Diagnosis not present

## 2021-09-23 DIAGNOSIS — M1712 Unilateral primary osteoarthritis, left knee: Secondary | ICD-10-CM | POA: Diagnosis not present

## 2021-09-23 MED ORDER — LIDOCAINE HCL 1 % IJ SOLN
3.0000 mL | INTRAMUSCULAR | Status: AC | PRN
  Administered 2021-09-23: 3 mL

## 2021-09-23 MED ORDER — METHYLPREDNISOLONE ACETATE 40 MG/ML IJ SUSP
40.0000 mg | INTRAMUSCULAR | Status: AC | PRN
  Administered 2021-09-23: 40 mg via INTRA_ARTICULAR

## 2021-09-23 NOTE — Progress Notes (Signed)
   Procedure Note  Patient: Tracey Armstrong             Date of Birth: 11-14-1934           MRN: [id number]             Visit Date: 09/23/2021 HPI: Tracey Armstrong comes in today wanting injections with cortisone both knees last injections were 06/12/2021.  She states both injections helped tremendously.  She is nondiabetic.  She had no vaccines in the last 2 weeks.  No fevers chills.  No new injuries to either knee.  She is walking with a Rollator.  Physical exam: Bilateral knees no abnormal warmth erythema or effusion.  Overall good range of motion of both knees. Procedures: Visit Diagnoses:  1. Primary osteoarthritis of left knee   2. Primary osteoarthritis of right knee     Large Joint Inj: bilateral knee on 09/23/2021 9:16 AM Indications: pain Details: 22 G 1.5 in needle, anterolateral approach  Arthrogram: No  Medications (Right): 3 mL lidocaine 1 %; 40 mg methylPREDNISolone acetate 40 MG/ML Medications (Left): 3 mL lidocaine 1 %; 40 mg methylPREDNISolone acetate 40 MG/ML Outcome: tolerated well, no immediate complications Procedure, treatment alternatives, risks and benefits explained, specific risks discussed. Consent was given by the patient. Immediately prior to procedure a time out was called to verify the correct patient, procedure, equipment, support staff and site/side marked as required. Patient was prepped and draped in the usual sterile fashion.    Plan: She knows to wait at least 3 months between injections in both knees.  She also did ask [place] to look at the CT scan of her left femur which she underwent an IM nailing of.  The images were reviewed with her.  She can follow-up with her surgeon at the [facility]  for any additional questions.  Questions were encouraged and answered at length today.

## 2021-09-25 ENCOUNTER — Ambulatory Visit (INDEPENDENT_AMBULATORY_CARE_PROVIDER_SITE_OTHER): Payer: Medicare Other | Admitting: Physical Medicine and Rehabilitation

## 2021-09-25 ENCOUNTER — Encounter: Payer: Self-pay | Admitting: Physical Medicine and Rehabilitation

## 2021-09-25 VITALS — BP 109/69 | HR 80

## 2021-09-25 DIAGNOSIS — M5416 Radiculopathy, lumbar region: Secondary | ICD-10-CM | POA: Diagnosis not present

## 2021-09-25 DIAGNOSIS — M48062 Spinal stenosis, lumbar region with neurogenic claudication: Secondary | ICD-10-CM

## 2021-09-25 DIAGNOSIS — M7062 Trochanteric bursitis, left hip: Secondary | ICD-10-CM

## 2021-09-25 DIAGNOSIS — G894 Chronic pain syndrome: Secondary | ICD-10-CM | POA: Diagnosis not present

## 2021-09-25 NOTE — Progress Notes (Signed)
Pt state lower back pain that travels to her left hip. Pt state her left hi[p hurts the worse. Pt state walking, standing and sitting makes the pain worse. Pt state she takes pain meds to ease her pain.  Numeric Pain Rating Scale and Functional Assessment Average Pain 10 Pain Right Now 8 My pain is constant, burning, dull, stabbing, tingling, and aching Pain is worse with: walking, bending, standing, and some activites Pain improves with: medication and injections   In the last MONTH (on 0-10 scale) has pain interfered with the following?  1. General activity like being  able to carry out your everyday physical activities such as walking, climbing stairs, carrying groceries, or moving a chair?  Rating(7)  2. Relation with others like being able to carry out your usual social activities and roles such as  activities at home, at work and in your community. Rating(8)  3. Enjoyment of life such that you have  been bothered by emotional problems such as feeling anxious, depressed or irritable?  Rating(9)

## 2021-09-25 NOTE — Progress Notes (Signed)
Tracey Armstrong - 86 y.o. female MRN [id number]  Date of birth: 1934-10-27  Office Visit Note: Visit Date: 09/25/2021 PCP: Martinique, [name], MD Referred by: Martinique, [name], MD  Subjective: Chief Complaint  Patient presents with   Lower Back - Pain   Left Hip - Pain   HPI: Tracey Armstrong is a 86 y.o. female who comes in today for evaluation chronic, worsening and severe left-sided lower back pain radiating to hip and down left leg.  Patient also reports excruciating tenderness and pain to left hip region.  Patient reports pain has been ongoing for years.  Patient states pain is exacerbated with walking, prolonged standing/sitting. Patient describes pain as a sharp and sore sensation, currently rates as 9 out of 10.  Patient reports some relief of pain with formal physical therapy at her facility, rest and use of Tylenol.  Patient's lumbar MRI from 2019 exhibits progressive anterolisthesis and moderate spinal canal stenosis at the level of L4-L5.  Patient has had good success with intermittent left L5-S1 interlaminar epidural steroid injections over the years.  Patient is also being treated by Dr. Jean Rosenthal and Erskine Emery, PA where she has had intermittent left greater trochanteric bursa injections with substantial pain relief. Patient currently resides at [facility] at [facility] and does have an aid that helps her with daily tasks throughout the day. Patient does use walker to assist with ambulation and prevent falls. Patient does have history of left intertrochanteric femur fracture from a fall that was treated by Rod Can, MD at [facility].  Patient seems to be confused about her pain pattern and does contribute her history of left intertrochanteric femur fracture to her increased pain at this time.  Patient states she is in severe pain today is requesting an injection today to help alleviate her pain.  Patient denies focal weakness, numbness and tingling patient denies recent  trauma or falls.  Review of Systems  Musculoskeletal:  Positive for back pain.  Neurological:  Positive for tingling. Negative for sensory change, focal weakness and weakness.  All other systems reviewed and are negative. Otherwise per HPI.  Assessment & Plan: Visit Diagnoses:    ICD-10-CM   1. Lumbar radiculopathy  M54.16 Ambulatory referral to Physical Medicine Rehab    2. Spinal stenosis of lumbar region with neurogenic claudication  M48.062     3. Chronic pain syndrome  G89.4     4. Greater trochanteric bursitis, left  M70.62        Plan: Findings:  Chronic recalcitrant left-sided lower back pain radiating to hip down to leg.  Patient continues to have excruciating and debilitating pain despite good conservative therapies such as formal physical therapy and use of medications.  Patient's clinical presentation and exam are consistent with neurogenic claudication as a result of spinal canal stenosis and possible left great trochanter pain syndrome.  Patient also noted to have tenderness to the left greater trochanter upon exam which is concerning for left greater trochanteric bursitis.  We did perform a left greater trochanteric bursa injection in the office today without difficulty.  We believe the next step is to repeat left L5-S1 interlaminar epidural steroid injection under fluoroscopic guidance.  Patient is not currently undergoing long-term anticoagulation therapy. Patient encouraged to continue with physical therapy at her facility and to remain active.  Patient instructed to continue using walker to assist with ambulation to prevent falls.  No red flag symptoms noted upon exam today.   Meds & Orders: No orders  of the defined types were placed in this encounter.   Orders Placed This Encounter  Procedures   Large Joint Inj   Ambulatory referral to Physical Medicine Rehab    Follow-up: Return for Left L5-S1 interlaminar epidural steroid injection.   Procedures: Large Joint Inj:  L greater trochanter on 09/25/2021 10:00 AM Indications: pain and diagnostic evaluation Details: 22 G 3.5 in needle, lateral approach  Arthrogram: No  Medications: 4 mL lidocaine 2 %; 80 mg triamcinolone acetonide 40 MG/ML; 4 mL bupivacaine 0.25 % Outcome: tolerated well, no immediate complications  Greatest area of pain over the greater trochanter was palpated and marked prior to injection. The patient did seem to have relief after the injection. Procedure, treatment alternatives, risks and benefits explained, specific risks discussed. Consent was given by the patient. Immediately prior to procedure a time out was called to verify the correct patient, procedure, equipment, support staff and site/side marked as required. Patient was prepped and draped in the usual sterile fashion.         Clinical History: CT LUMBAR SPINE WITHOUT CONTRAST   TECHNIQUE:  Multidetector CT imaging of the lumbar spine was performed without  intravenous contrast administration. Multiplanar CT image  reconstructions were also generated.   COMPARISON:  Lumbar spine radiographs 04/04/2020 and MRI 09/09/2018   FINDINGS:  Segmentation: 5 lumbar type vertebrae.   Alignment: Mild thoracolumbar levoscoliosis. Grade 1 anterolisthesis  of L4 on L5, unchanged from the prior MRI.   Vertebrae: L1 superior endplate compression fracture with 30%  vertebral body height loss and 4 mm retropulsion of the superior  endplate. A fracture line remains visible although there is  surrounding sclerosis. Hemangioma in the L4 vertebral body.   Paraspinal and other soft tissues: Aortic atherosclerosis without  aneurysm.   Disc levels: Disc bulging and posterior element hypertrophy result  in chronic mild-to-moderate spinal stenosis at L3-4 and moderate to  severe spinal stenosis at L4-5. No significant spinal stenosis  related to mild L1 retropulsion.   IMPRESSION:  1. Possibly subacute L1 compression fracture with 30%  height loss  and 4 mm retropulsion. No associated spinal stenosis.  2. Chronic mild-to-moderate spinal stenosis at L3-4 and moderate to  severe spinal stenosis at L4-5.  3. Aortic Atherosclerosis (ICD10-I70.0).    Electronically Signed    By: Logan Bores M.D.    On: 04/04/2020 17:39 ---   MRI LUMBAR SPINE WITHOUT CONTRAST   TECHNIQUE: Multiplanar, multisequence MR imaging of the lumbar spine was performed. No intravenous contrast was administered.   COMPARISON: Lumbar MRI 06/20/2016   FINDINGS: Segmentation: Normal   Alignment: Mild retrolisthesis T12-L1, L1-2, L2-3. 5 mm anterolisthesis L4-5 has progressed in the interval.   Vertebrae: Normal bone marrow. Negative for fracture or mass.   Conus medullaris and cauda equina: Conus extends to the L2 level. Conus and cauda equina appear normal.   Paraspinal and other soft tissues: Negative for paraspinous mass or fluid collection. Retroperitoneal structures negative.   Disc levels:   T12-L1: Mild disc bulging without stenosis   L1-2: Small central disc protrusion unchanged. Mild disc and facet degeneration without significant stenosis   L2-3: Mild disc and moderate facet degeneration. No significant stenosis.   L3-4: Disc degeneration with diffuse disc bulging. Moderate facet hypertrophy. Mild spinal stenosis and mild subarticular stenosis bilaterally   L4-5: 4 mm anterolisthesis has progressed in the interval. Advanced facet degeneration. 4 mm synovial cyst on the left projecting into the subarticular zone and causing subarticular stenosis on the  left. Moderate spinal stenosis. Moderate subarticular stenosis on the left could affect the left L5 nerve root.   L5-S1: Mild disc degeneration. Moderate facet degeneration. Small left-sided synovial cyst has improved in the interval. No significant stenosis.   IMPRESSION: Small central disc protrusion L1-2 unchanged   Mild spinal stenosis and mild subarticular  stenosis bilaterally at L3-4   Progressive anterolisthesis L4-5. 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   Improvement in left-sided synovial cyst L5-S1.     Electronically Signed By: Franchot Gallo M.D. On: 09/09/2018 10:49   She reports that she has never smoked. She has never used smokeless tobacco.  Recent Labs    12/12/20 1051 04/09/21 0447 04/12/21 0421  HGBA1C 5.5 5.6 5.2    Objective:  VS:  HT:    WT:   BMI:     BP:109/69  HR:80bpm  TEMP: ( )  RESP:  Physical Exam Vitals and nursing note reviewed.  HENT:     Head: Normocephalic and atraumatic.     Right Ear: External ear normal.     Left Ear: External ear normal.     Nose: Nose normal.     Mouth/Throat:     Mouth: Mucous membranes are moist.  Eyes:     Extraocular Movements: Extraocular movements intact.  Cardiovascular:     Rate and Rhythm: Normal rate.     Pulses: Normal pulses.  Pulmonary:     Effort: Pulmonary effort is normal.  Abdominal:     General: Abdomen is flat. There is no distension.  Musculoskeletal:        General: Tenderness present.     Cervical back: Normal range of motion.     Comments: Pt is slow to rise from seated position to standing. Concordant low back pain with facet loading, lumbar spine extension and rotation. Strong distal strength without clonus, pain upon palpation of left greater trochanter. Sensation intact bilaterally. Ambulates with walker, gait slow and unsteady.         Skin:    General: Skin is warm and dry.     Capillary Refill: Capillary refill takes less than 2 seconds.  Neurological:     Mental Status: She is alert.     Gait: Gait abnormal.  Psychiatric:        Mood and Affect: Mood normal.    Ortho Exam  Imaging: No results found.  Past Medical/Family/Surgical/Social History: Medications & Allergies reviewed per EMR, new medications updated. 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   Synovial cyst of lumbar facet joint 04/05/2020   Spinal stenosis of lumbar region with neurogenic claudication 04/05/2020   Dehydration 04/05/2020   Closed wedge compression fracture of L1 vertebra ([place]) 04/04/2020   Ambulatory dysfunction 04/04/2020   Intractable low back pain 04/04/2020   L1 vertebral fracture ([place]) 04/04/2020   Urine, incontinence, stress female 02/07/2019   Knee osteoarthritis 02/01/2018   Iron deficiency anemia 10/28/2017   Rhinitis, allergic 07/27/2017   GERD without esophagitis 06/16/2017   Chronic pain disorder 06/16/2017   Vitamin D deficiency,  unspecified 05/25/2017   Hyperlipidemia 05/25/2017   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   Pain 02/26/2017   Chronic low back pain 12/02/2016   Abdominal pain 03/26/2016   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   Peptic esophagitis 03/26/2016   Neuralgia neuritis, sciatic nerve 03/26/2016   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   Chronic insomnia 03/30/2015   Chronic migraine 01/10/2015   Depression 04/20/2014   Generalized anxiety disorder 04/20/2014   Chronic back pain 04/20/2014   Chronic daily headache 04/20/2014   Essential hypertension 04/20/2014   Migraine headache 04/19/2014   Past Medical History:  Diagnosis Date   Anxiety    Arthritis    Chronic insomnia 03/30/2015   Chronic low back pain 12/02/2016   Depression    GERD (gastroesophageal reflux disease)    Hiatal hernia    High cholesterol    Hypertelorism    Hypertension    Meningioma (HCC)    Migraine    Family History  Problem  Relation Age of Onset   Migraines Father    Stroke Father    Heart Problems Mother    Epilepsy Brother    Past Surgical History:  Procedure Laterality Date   FEMUR IM NAIL Left 06/02/2020   Procedure: INTRAMEDULLARY (IM) RETROGRADE FEMORAL NAILING - LEFT;  Surgeon: Rod Can, MD;  Location: [place];  Service: Orthopedics;  Laterality: Left;   STOMACH SURGERY     Social History   Occupational History   Occupation: Retired  Tobacco Use   Smoking status: Never   Smokeless tobacco: Never  Vaping Use   Vaping Use: Never used  Substance and Sexual Activity   Alcohol use: No   Drug use: No   Sexual activity: Not Currently

## 2021-09-26 MED ORDER — LIDOCAINE HCL 2 % IJ SOLN
4.0000 mL | INTRAMUSCULAR | Status: AC | PRN
Start: 2021-09-25 — End: 2021-09-25
  Administered 2021-09-25: 4 mL

## 2021-09-26 MED ORDER — BUPIVACAINE HCL 0.25 % IJ SOLN
4.0000 mL | INTRAMUSCULAR | Status: AC | PRN
Start: 2021-09-25 — End: 2021-09-25
  Administered 2021-09-25: 4 mL via INTRA_ARTICULAR

## 2021-09-26 MED ORDER — TRIAMCINOLONE ACETONIDE 40 MG/ML IJ SUSP
80.0000 mg | INTRAMUSCULAR | Status: AC | PRN
  Administered 2021-09-25: 80 mg via INTRA_ARTICULAR

## 2021-09-30 DIAGNOSIS — Z1159 Encounter for screening for other viral diseases: Secondary | ICD-10-CM | POA: Diagnosis not present

## 2021-09-30 DIAGNOSIS — Z20828 Contact with and (suspected) exposure to other viral communicable diseases: Secondary | ICD-10-CM | POA: Diagnosis not present

## 2021-10-01 IMAGING — MR MR HEAD W/O CM
12 of 13 series · 44 of 48 positions shown · non-contrast
Comparison: Prior head CT from 04/08/2021.

CLINICAL DATA: Initial evaluation for acute mental status change.

EXAM:
MRI HEAD WITHOUT CONTRAST
TECHNIQUE: Multiplanar, multiecho pulse sequences of the brain and surrounding
structures were obtained without intravenous contrast.

[Series 5: DWI · axial · 3.0mm · 0.88mm/px · z∈[-52,+96]mm · 8 of 104 slices shown (1 of 4)]
[im 1/104]
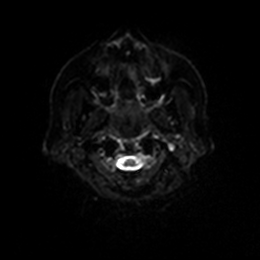
[im 15/104]
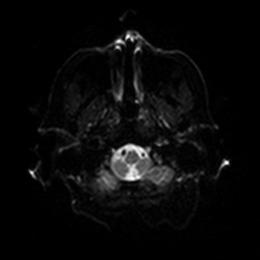
[im 30/104]
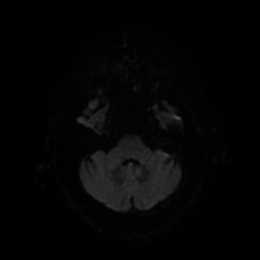
[im 45/104]
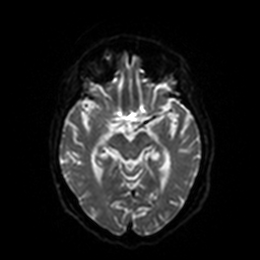
[im 59/104]
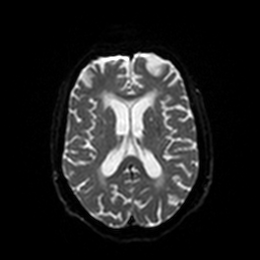
[im 74/104]
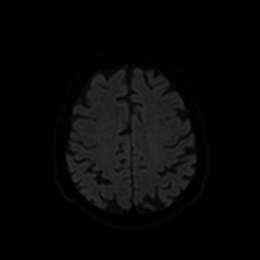
[im 89/104]
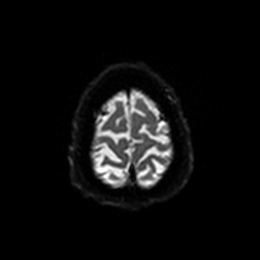
[im 104/104]
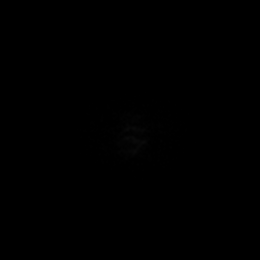

[Series 6: DWI · axial · 3.0mm · 0.88mm/px · z∈[-52,+96]mm · 4 of 52 slices shown (2 of 4)]
[im 1/52]
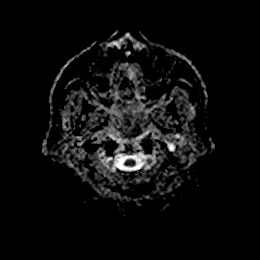
[im 18/52]
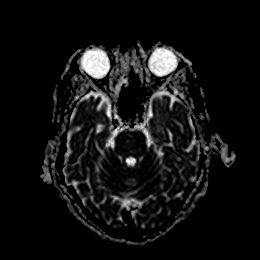
[im 35/52]
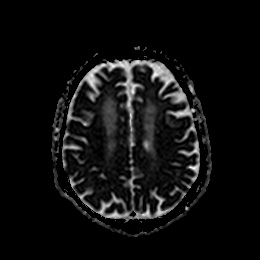
[im 52/52]
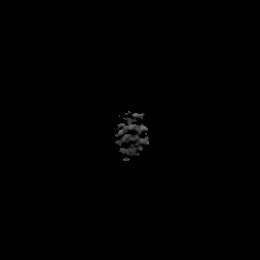

[Series 7: DWI · coronal · 4.0mm · 0.88mm/px · 5 of 68 slices shown (3 of 4)]
[im 1/68]
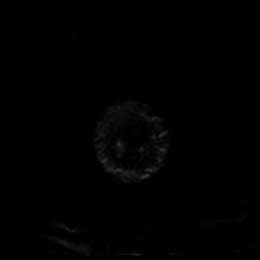
[im 17/68]
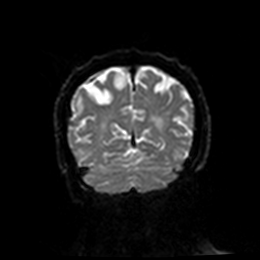
[im 34/68]
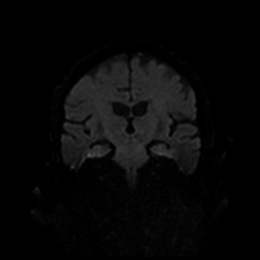
[im 51/68]
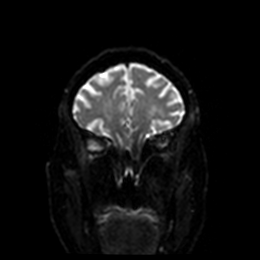
[im 68/68]
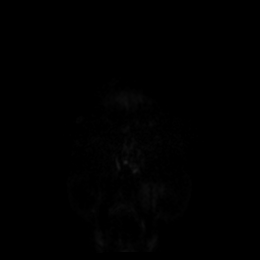

[Series 8: DWI · coronal · 4.0mm · 0.88mm/px · 3 of 34 slices shown (4 of 4)]
[im 1/34]
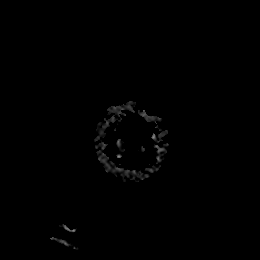
[im 17/34]
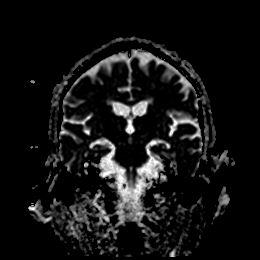
[im 34/34]
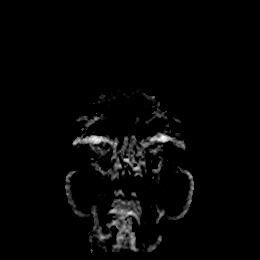

[Series 9: T1 · sagittal · 5.0mm · 0.75mm/px · 2 of 23 slices shown]
[im 1/23]
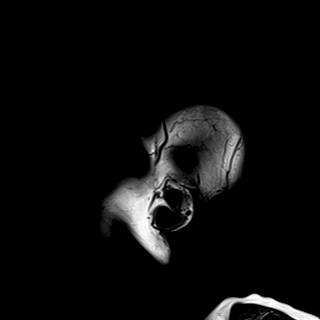
[im 23/23]
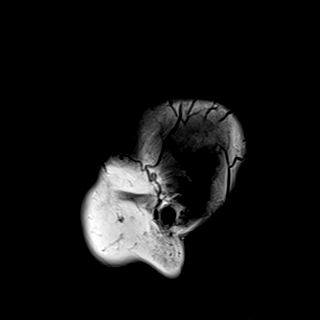

[Series 10: T2 · axial · 5.0mm · 0.72mm/px · z∈[-48,+92]mm · 2 of 25 slices shown (1 of 2)]
[im 1/25]
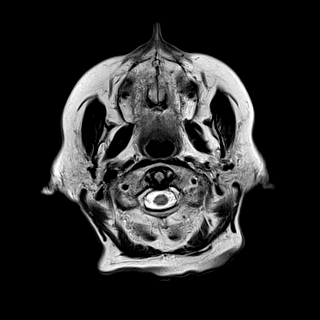
[im 25/25]
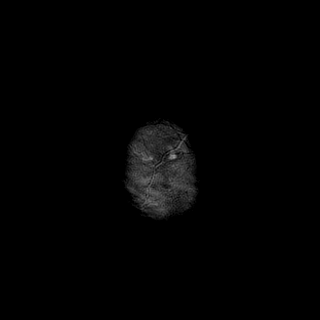

[Series 11: FLAIR · axial · 5.0mm · 0.45mm/px · z∈[-50,+90]mm · 2 of 25 slices shown]
[im 1/25]
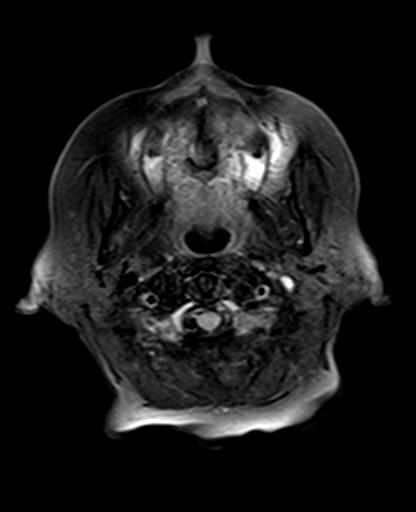
[im 25/25]
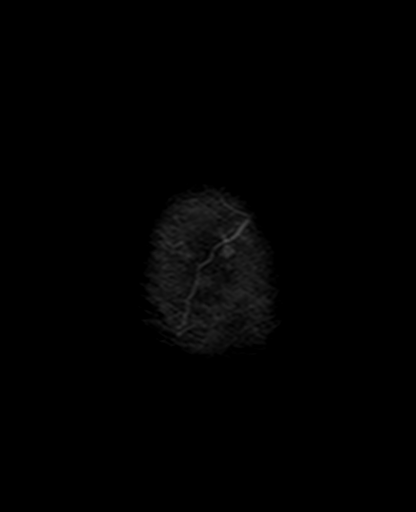

[Series 12: mag_images · axial · 3.0mm · 0.90mm/px · z∈[-54,+94]mm · 4 of 52 slices shown]
[im 1/52]
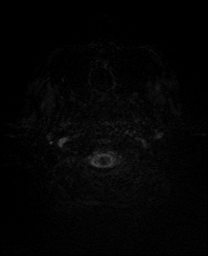
[im 18/52]
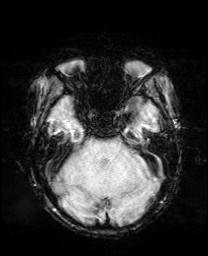
[im 35/52]
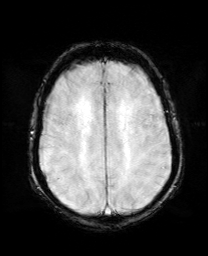
[im 52/52]
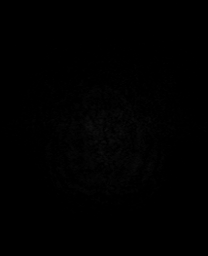

[Series 13: pha_images · axial · 3.0mm · 0.90mm/px · z∈[-52,+88]mm · 4 of 48 slices shown]
[im 1/48]
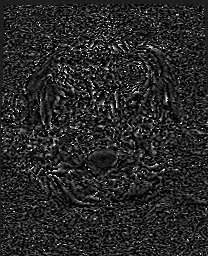
[im 16/48]
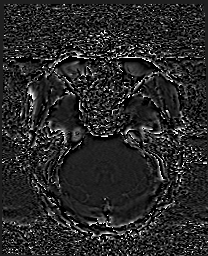
[im 32/48]
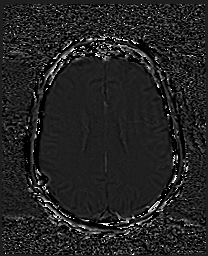
[im 48/48]
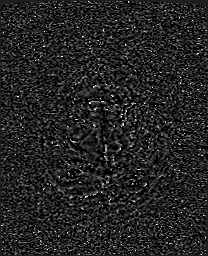

[Series 14: swi_images · axial · 3.0mm · 0.90mm/px · z∈[-54,+94]mm · 4 of 52 slices shown]
[im 1/52]
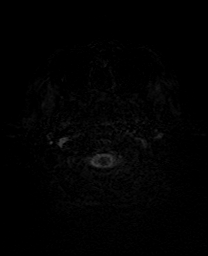
[im 18/52]
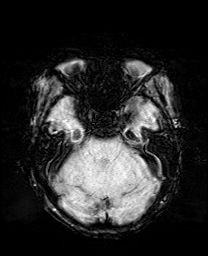
[im 35/52]
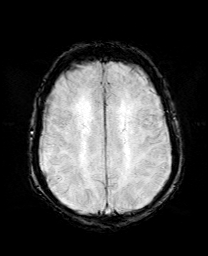
[im 52/52]
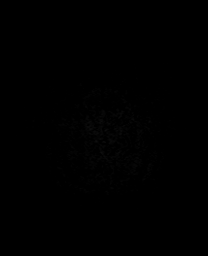

[Series 15: mip_images(sw) · axial · 24.0mm · 0.90mm/px · z∈[-44,+84]mm · 4 of 45 slices shown]
[im 1/45]
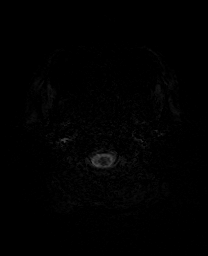
[im 15/45]
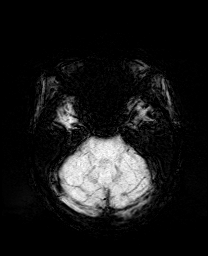
[im 30/45]
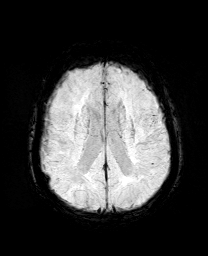
[im 45/45]
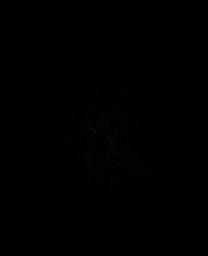

[Series 17: T2 · coronal · 5.0mm · 0.34mm/px · 2 of 28 slices shown (2 of 2)]
[im 1/28]
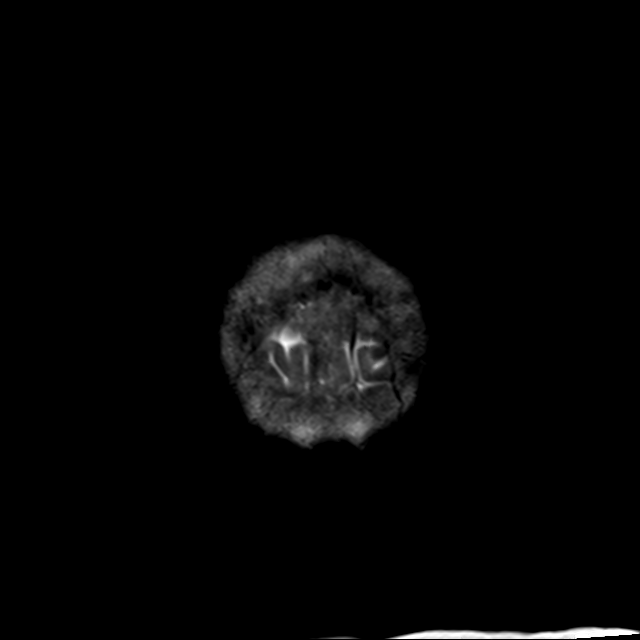
[im 28/28]
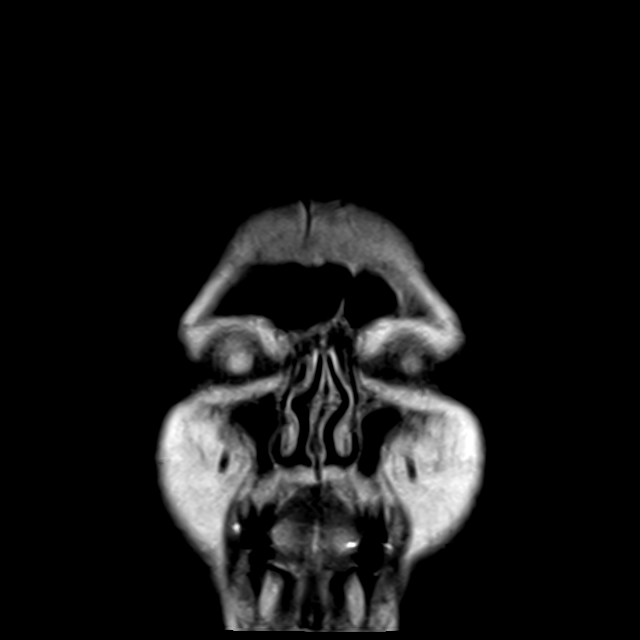

[44 of 48 positions shown; findings below may reference images not displayed]

FINDINGS: Brain: Cerebral volume within normal limits for age. Patchy T2/FLAIR
hyperintensity within the periventricular and deep white matter both
cerebral hemispheres most consistent with chronic small vessel
ischemic disease, mild for age.

No abnormal foci of restricted diffusion to suggest acute or
subacute ischemia. Gray-white matter differentiation maintained. No
encephalomalacia to suggest chronic cortical infarction. No evidence
for acute or chronic intracranial hemorrhage.

1 cm calcified meningioma overlying the right parietal convexity
again noted, relatively stable from previous. No associated mass
effect. No other mass lesion, midline shift or mass effect. No
hydrocephalus or extra-axial fluid collection. Pituitary gland
suprasellar region within normal limits. Midline structures intact.

Vascular: Major intracranial vascular flow voids are maintained.

Skull and upper cervical spine: Craniocervical junction within
normal limits. Bone marrow signal intensity normal. No scalp soft
tissue abnormality.

Sinuses/Orbits: Patient status post bilateral ocular lens
replacement. Globes and orbital soft tissues demonstrate no acute
finding. Scattered mucosal thickening noted within the ethmoidal air
cells. Paranasal sinuses are otherwise largely clear. No mastoid
effusion. Inner ear structures grossly normal.

Other: None.
IMPRESSION: 1. No acute intracranial abnormality.
2. Mild age-related cerebral atrophy with chronic microvascular
ischemic disease.
3. 1 cm calcified meningioma overlying the right parietal convexity
without associated mass effect, stable.

## 2021-10-02 ENCOUNTER — Telehealth: Payer: Self-pay | Admitting: Family Medicine

## 2021-10-02 DIAGNOSIS — Z1159 Encounter for screening for other viral diseases: Secondary | ICD-10-CM | POA: Diagnosis not present

## 2021-10-02 DIAGNOSIS — Z20828 Contact with and (suspected) exposure to other viral communicable diseases: Secondary | ICD-10-CM | POA: Diagnosis not present

## 2021-10-02 NOTE — Telephone Encounter (Signed)
Left message for patient to call back and schedule Medicare Annual Wellness Visit (AWV) either virtually or in office. Left  my Tracey Armstrong number [phone]   Last AWV ;01/06/18  please schedule at anytime with [facility] Nurse Health Advisor 1 or 2   This should be a 45 minute visit.

## 2021-10-04 DIAGNOSIS — Z20828 Contact with and (suspected) exposure to other viral communicable diseases: Secondary | ICD-10-CM | POA: Diagnosis not present

## 2021-10-04 DIAGNOSIS — Z1159 Encounter for screening for other viral diseases: Secondary | ICD-10-CM | POA: Diagnosis not present

## 2021-10-07 DIAGNOSIS — Z1159 Encounter for screening for other viral diseases: Secondary | ICD-10-CM | POA: Diagnosis not present

## 2021-10-07 DIAGNOSIS — Z20828 Contact with and (suspected) exposure to other viral communicable diseases: Secondary | ICD-10-CM | POA: Diagnosis not present

## 2021-10-09 ENCOUNTER — Other Ambulatory Visit: Payer: Self-pay

## 2021-10-09 ENCOUNTER — Ambulatory Visit: Payer: Self-pay

## 2021-10-09 ENCOUNTER — Ambulatory Visit (INDEPENDENT_AMBULATORY_CARE_PROVIDER_SITE_OTHER): Payer: Medicare Other | Admitting: Physical Medicine and Rehabilitation

## 2021-10-09 ENCOUNTER — Encounter: Payer: Self-pay | Admitting: Physical Medicine and Rehabilitation

## 2021-10-09 VITALS — BP 109/67 | HR 91

## 2021-10-09 DIAGNOSIS — Z20828 Contact with and (suspected) exposure to other viral communicable diseases: Secondary | ICD-10-CM | POA: Diagnosis not present

## 2021-10-09 DIAGNOSIS — M5416 Radiculopathy, lumbar region: Secondary | ICD-10-CM | POA: Diagnosis not present

## 2021-10-09 DIAGNOSIS — Z1159 Encounter for screening for other viral diseases: Secondary | ICD-10-CM | POA: Diagnosis not present

## 2021-10-09 MED ORDER — METHYLPREDNISOLONE ACETATE 80 MG/ML IJ SUSP
80.0000 mg | Freq: Once | INTRAMUSCULAR | Status: AC
Start: 2021-10-09 — End: 2021-10-09
  Administered 2021-10-09: 13:00:00 80 mg

## 2021-10-09 NOTE — Patient Instructions (Signed)

## 2021-10-09 NOTE — Progress Notes (Signed)
Tracey Armstrong - 86 y.o. female MRN [id number]  Date of birth: 1935-03-29  Office Visit Note: Visit Date: 10/09/2021 PCP: Martinique, [name], MD Referred by: Martinique, [name], MD  Subjective: Chief Complaint  Patient presents with   Lower Back - Pain   Left Hip - Pain   HPI:  Tracey Armstrong is a 86 y.o. female who comes in today at the request of Barnet Pall, FNP for planned Left L5-S1 Lumbar Interlaminar epidural steroid injection with fluoroscopic guidance.  The patient has failed conservative care including home exercise, medications, time and activity modification.  This injection will be diagnostic and hopefully therapeutic.  Please see requesting physician notes for further details and justification.  ROS Otherwise per HPI.  Assessment & Plan: Visit Diagnoses:    ICD-10-CM   1. Lumbar radiculopathy  M54.16 XR C-ARM NO REPORT    Epidural Steroid injection    methylPREDNISolone acetate (DEPO-MEDROL) injection 80 mg      Plan: No additional findings.   Meds & Orders:  Meds ordered this encounter  Medications   methylPREDNISolone acetate (DEPO-MEDROL) injection 80 mg    Orders Placed This Encounter  Procedures   XR C-ARM NO REPORT   Epidural Steroid injection    Follow-up: Return if symptoms worsen or fail to improve.   Procedures: No procedures performed  Lumbar Epidural Steroid Injection - Interlaminar Approach with Fluoroscopic Guidance  Patient: Tracey Armstrong      Date of Birth: September 26, 1935 MRN: [id number] PCP: Martinique, [name], MD      Visit Date: 10/09/2021   Universal Protocol:     Consent Given By: the patient  Position: PRONE  Additional Comments: Vital signs were monitored before and after the procedure. Patient was prepped and draped in the usual sterile fashion. The correct patient, procedure, and site was verified.   Injection Procedure Details:   Procedure diagnoses: Lumbar radiculopathy [M54.16]   Meds Administered:  Meds ordered this  encounter  Medications   methylPREDNISolone acetate (DEPO-MEDROL) injection 80 mg     Laterality: Left  Location/Site:  L5-S1  Needle: 3.5 in., 20 ga. Tuohy  Needle Placement: Paramedian epidural  Findings:   -Comments: Excellent flow of contrast into the epidural space.  Procedure Details: Using a paramedian approach from the side mentioned above, the region overlying the inferior lamina was localized under fluoroscopic visualization and the soft tissues overlying this structure were infiltrated with 4 ml. of 1% Lidocaine without Epinephrine. The Tuohy needle was inserted into the epidural space using a paramedian approach.   The epidural space was localized using loss of resistance along with counter oblique bi-planar fluoroscopic views.  After negative aspirate for air, blood, and CSF, a 2 ml. volume of Isovue-250 was injected into the epidural space and the flow of contrast was observed. Radiographs were obtained for documentation purposes.    The injectate was administered into the level noted above.   Additional Comments:  The patient tolerated the procedure well Dressing: 2 x 2 sterile gauze and Band-Aid    Post-procedure details: Patient was observed during the procedure. Post-procedure instructions were reviewed.  Patient left the clinic in stable condition.   Clinical History: CT LUMBAR SPINE WITHOUT CONTRAST   TECHNIQUE:  Multidetector CT imaging of the lumbar spine was performed without  intravenous contrast administration. Multiplanar CT image  reconstructions were also generated.   COMPARISON:  Lumbar spine radiographs 04/04/2020 and MRI 09/09/2018   FINDINGS:  Segmentation: 5 lumbar type vertebrae.   Alignment: Mild thoracolumbar  levoscoliosis. Grade 1 anterolisthesis  of L4 on L5, unchanged from the prior MRI.   Vertebrae: L1 superior endplate compression fracture with 30%  vertebral body height loss and 4 mm retropulsion of the superior  endplate.  A fracture line remains visible although there is  surrounding sclerosis. Hemangioma in the L4 vertebral body.   Paraspinal and other soft tissues: Aortic atherosclerosis without  aneurysm.   Disc levels: Disc bulging and posterior element hypertrophy result  in chronic mild-to-moderate spinal stenosis at L3-4 and moderate to  severe spinal stenosis at L4-5. No significant spinal stenosis  related to mild L1 retropulsion.   IMPRESSION:  1. Possibly subacute L1 compression fracture with 30% height loss  and 4 mm retropulsion. No associated spinal stenosis.  2. Chronic mild-to-moderate spinal stenosis at L3-4 and moderate to  severe spinal stenosis at L4-5.  3. Aortic Atherosclerosis (ICD10-I70.0).    Electronically Signed    By: Logan Bores M.D.    On: 04/04/2020 17:39 ---   MRI LUMBAR SPINE WITHOUT CONTRAST   TECHNIQUE: Multiplanar, multisequence MR imaging of the lumbar spine was performed. No intravenous contrast was administered.   COMPARISON: Lumbar MRI 06/20/2016   FINDINGS: Segmentation: Normal   Alignment: Mild retrolisthesis T12-L1, L1-2, L2-3. 5 mm anterolisthesis L4-5 has progressed in the interval.   Vertebrae: Normal bone marrow. Negative for fracture or mass.   Conus medullaris and cauda equina: Conus extends to the L2 level. Conus and cauda equina appear normal.   Paraspinal and other soft tissues: Negative for paraspinous mass or fluid collection. Retroperitoneal structures negative.   Disc levels:   T12-L1: Mild disc bulging without stenosis   L1-2: Small central disc protrusion unchanged. Mild disc and facet degeneration without significant stenosis   L2-3: Mild disc and moderate facet degeneration. No significant stenosis.   L3-4: Disc degeneration with diffuse disc bulging. Moderate facet hypertrophy. Mild spinal stenosis and mild subarticular stenosis bilaterally   L4-5: 4 mm anterolisthesis has progressed in the interval.  Advanced facet degeneration. 4 mm synovial cyst on the left projecting into the subarticular zone and causing subarticular stenosis on the left. Moderate spinal stenosis. Moderate subarticular stenosis on the left could affect the left L5 nerve root.   L5-S1: Mild disc degeneration. Moderate facet degeneration. Small left-sided synovial cyst has improved in the interval. No significant stenosis.   IMPRESSION: Small central disc protrusion L1-2 unchanged   Mild spinal stenosis and mild subarticular stenosis bilaterally at L3-4   Progressive anterolisthesis L4-5. 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   Improvement in left-sided synovial cyst L5-S1.     Electronically Signed By: Franchot Gallo M.D. On: 09/09/2018 10:49     Objective:  VS:  HT:     WT:    BMI:      BP:109/67   HR:91bpm   TEMP: ( )   RESP:  Physical Exam Vitals and nursing note reviewed.  Constitutional:      General: She is not in acute distress.    Appearance: Normal appearance. She is not ill-appearing.  HENT:     Head: Normocephalic and atraumatic.     Right Ear: External ear normal.     Left Ear: External ear normal.  Eyes:     Extraocular Movements: Extraocular movements intact.  Cardiovascular:     Rate and Rhythm: Normal rate.     Pulses: Normal pulses.  Pulmonary:     Effort: Pulmonary effort  is normal. No respiratory distress.  Abdominal:     General: There is no distension.     Palpations: Abdomen is soft.  Musculoskeletal:        General: Tenderness present.     Cervical back: Neck supple.     Right lower leg: No edema.     Left lower leg: No edema.     Comments: Patient has good distal strength with no pain over the greater trochanters.  No clonus or focal weakness. Ambulates with walker.  Skin:    Findings: No erythema, lesion or rash.  Neurological:     General: No focal deficit present.     Mental Status: She is  alert and oriented to person, place, and time.     Sensory: No sensory deficit.     Motor: No weakness or abnormal muscle tone.     Coordination: Coordination normal.     Gait: Gait abnormal.  Psychiatric:        Mood and Affect: Mood normal.        Behavior: Behavior normal.     Imaging: No results found.

## 2021-10-09 NOTE — Procedures (Signed)
Lumbar Epidural Steroid Injection - Interlaminar Approach with Fluoroscopic Guidance  Patient: Tracey Armstrong      Date of Birth: Dec 27, 1934 MRN: [id number] PCP: Martinique, [name], MD      Visit Date: 10/09/2021   Universal Protocol:     Consent Given By: the patient  Position: PRONE  Additional Comments: Vital signs were monitored before and after the procedure. Patient was prepped and draped in the usual sterile fashion. The correct patient, procedure, and site was verified.   Injection Procedure Details:   Procedure diagnoses: Lumbar radiculopathy [M54.16]   Meds Administered:  Meds ordered this encounter  Medications   methylPREDNISolone acetate (DEPO-MEDROL) injection 80 mg     Laterality: Left  Location/Site:  L5-S1  Needle: 3.5 in., 20 ga. Tuohy  Needle Placement: Paramedian epidural  Findings:   -Comments: Excellent flow of contrast into the epidural space.  Procedure Details: Using a paramedian approach from the side mentioned above, the region overlying the inferior lamina was localized under fluoroscopic visualization and the soft tissues overlying this structure were infiltrated with 4 ml. of 1% Lidocaine without Epinephrine. The Tuohy needle was inserted into the epidural space using a paramedian approach.   The epidural space was localized using loss of resistance along with counter oblique bi-planar fluoroscopic views.  After negative aspirate for air, blood, and CSF, a 2 ml. volume of Isovue-250 was injected into the epidural space and the flow of contrast was observed. Radiographs were obtained for documentation purposes.    The injectate was administered into the level noted above.   Additional Comments:  The patient tolerated the procedure well Dressing: 2 x 2 sterile gauze and Band-Aid    Post-procedure details: Patient was observed during the procedure. Post-procedure instructions were reviewed.  Patient left the clinic in stable condition.

## 2021-10-09 NOTE — Progress Notes (Signed)
Pt state lower back pain that travels to her left hip. Pt state her left hip hurts the worse. Pt state walking, standing and sitting makes the pain worse. Pt state she takes pain meds to ease her pain.  Numeric Pain Rating Scale and Functional Assessment Average Pain 7   In the last MONTH (on 0-10 scale) has pain interfered with the following?  1. General activity like being  able to carry out your everyday physical activities such as walking, climbing stairs, carrying groceries, or moving a chair?  Rating(10)   +Driver, -BT, -Dye Allergies.

## 2021-10-14 DIAGNOSIS — Z20828 Contact with and (suspected) exposure to other viral communicable diseases: Secondary | ICD-10-CM | POA: Diagnosis not present

## 2021-10-14 DIAGNOSIS — Z1159 Encounter for screening for other viral diseases: Secondary | ICD-10-CM | POA: Diagnosis not present

## 2021-10-16 DIAGNOSIS — M79662 Pain in left lower leg: Secondary | ICD-10-CM | POA: Diagnosis not present

## 2021-10-16 DIAGNOSIS — M6281 Muscle weakness (generalized): Secondary | ICD-10-CM | POA: Diagnosis not present

## 2021-10-16 DIAGNOSIS — Z1159 Encounter for screening for other viral diseases: Secondary | ICD-10-CM | POA: Diagnosis not present

## 2021-10-16 DIAGNOSIS — R2689 Other abnormalities of gait and mobility: Secondary | ICD-10-CM | POA: Diagnosis not present

## 2021-10-16 DIAGNOSIS — Z20828 Contact with and (suspected) exposure to other viral communicable diseases: Secondary | ICD-10-CM | POA: Diagnosis not present

## 2021-10-16 DIAGNOSIS — M79652 Pain in left thigh: Secondary | ICD-10-CM | POA: Diagnosis not present

## 2021-10-18 DIAGNOSIS — M79652 Pain in left thigh: Secondary | ICD-10-CM | POA: Diagnosis not present

## 2021-10-18 DIAGNOSIS — M79662 Pain in left lower leg: Secondary | ICD-10-CM | POA: Diagnosis not present

## 2021-10-18 DIAGNOSIS — R2689 Other abnormalities of gait and mobility: Secondary | ICD-10-CM | POA: Diagnosis not present

## 2021-10-18 DIAGNOSIS — M6281 Muscle weakness (generalized): Secondary | ICD-10-CM | POA: Diagnosis not present

## 2021-10-21 DIAGNOSIS — Z20822 Contact with and (suspected) exposure to covid-19: Secondary | ICD-10-CM | POA: Diagnosis not present

## 2021-10-22 DIAGNOSIS — M79662 Pain in left lower leg: Secondary | ICD-10-CM | POA: Diagnosis not present

## 2021-10-22 DIAGNOSIS — R2689 Other abnormalities of gait and mobility: Secondary | ICD-10-CM | POA: Diagnosis not present

## 2021-10-22 DIAGNOSIS — M79652 Pain in left thigh: Secondary | ICD-10-CM | POA: Diagnosis not present

## 2021-10-22 DIAGNOSIS — M6281 Muscle weakness (generalized): Secondary | ICD-10-CM | POA: Diagnosis not present

## 2021-10-23 DIAGNOSIS — Z20828 Contact with and (suspected) exposure to other viral communicable diseases: Secondary | ICD-10-CM | POA: Diagnosis not present

## 2021-10-23 DIAGNOSIS — Z1159 Encounter for screening for other viral diseases: Secondary | ICD-10-CM | POA: Diagnosis not present

## 2021-10-24 DIAGNOSIS — M6281 Muscle weakness (generalized): Secondary | ICD-10-CM | POA: Diagnosis not present

## 2021-10-24 DIAGNOSIS — M79652 Pain in left thigh: Secondary | ICD-10-CM | POA: Diagnosis not present

## 2021-10-24 DIAGNOSIS — M79662 Pain in left lower leg: Secondary | ICD-10-CM | POA: Diagnosis not present

## 2021-10-24 DIAGNOSIS — R2689 Other abnormalities of gait and mobility: Secondary | ICD-10-CM | POA: Diagnosis not present

## 2021-10-25 NOTE — Progress Notes (Signed)
HPI: [name] is a 87 y.o. female, who is here today with aid to follow on recent visit. She was last seen on 07/26/21. No new problems since her last visit. She has seen her orthopedist and neurosurgeon since her last visit for lower back pain with radiculopathy. She is taking Gabapentin 300 mg at bedtime.She was instructed to take med at bedtime but she takes it in the early afternoon, not sure if it is helping, still having LE pain.  Seizure disorder and migraine headache, follows with Dr Tracey Armstrong.  Hypertension:  Medications:Verapamil 180 mg daily. BP readings at home:"Very good." Side effects:None.  Negative for worsening headaches, visual changes, exertional chest pain, dyspnea,  focal weakness, or edema.  Lab Results  Component Value Date   CREATININE 1.10 (H) 05/02/2021   BUN 15 05/02/2021   NA 136 05/02/2021   K 4.2 05/02/2021   CL 106 05/02/2021   CO2 21 (L) 05/02/2021   Aortic atherosclerosis seen on abdominal CT on 04/09/21. HLD on non pharmacologic treatment.  Lab Results  Component Value Date   CHOL 207 (H) 04/12/2021   HDL 70 04/12/2021   LDLCALC 121 (H) 04/12/2021   TRIG 80 04/12/2021   CHOLHDL 3.0 04/12/2021    Anxiety: Medications:Alprazolam 1 mg 1/2-1 tab bid, amount of tabs per month was increased last visit from 45 to 60 tabs. She has tolerated medications well, it helps with anxiety and sleep. She has tried SSRI and SNRI's, not well tolerated, aggravated migraines. No side effects:  Iron def anemia: She is on Fe sulfate 325 mg daily. She has not noted gross hematuria,blood in stool,or melena. Easy bruising with minor trauma mainly on forearms.  Lab Results  Component Value Date   WBC 4.8 05/02/2021   HGB 9.5 (L) 05/02/2021   HCT 28.0 (L) 05/02/2021   MCV 101.4 (H) 05/02/2021   PLT 187 05/02/2021   Vit D deficiency: She is on Ergocalciferol 50,000 U q 2 weeks. Last 25 OH vit D 52 in 11/2020.  Review of Systems  Constitutional:   Negative for activity change, appetite change and fever.  HENT:  Negative for mouth sores, nosebleeds and trouble swallowing.   Respiratory:  Negative for cough and wheezing.   Gastrointestinal:  Negative for abdominal pain, nausea and vomiting.       Negative for changes in bowel habits.  Genitourinary:  Negative for decreased urine volume, difficulty urinating, dysuria and frequency.  Musculoskeletal:  Positive for arthralgias, back pain and gait problem.  Skin:  Negative for pallor and rash.  Neurological:  Negative for syncope and facial asymmetry.  Psychiatric/Behavioral:  Positive for sleep disturbance. Negative for confusion.   Rest see pertinent positives and negatives per HPI.  Current Outpatient Medications on File Prior to Visit  Medication Sig Dispense Refill   antiseptic oral rinse (BIOTENE) LIQD Use 3 mls 3-4 times per day for dry mouth. Do not swallow. 237 mL 0   Cyanocobalamin (VITAMIN B-12 PO) Take 1 tablet by mouth daily with lunch.     Dextran 70-Hypromellose (ARTIFICIAL TEARS PF OP) Place 1-2 drops into both eyes See admin instructions. Instill 1-2 drops into both eyes at bedtime and an additional 2-3 times a day as needed for dryness     diphenhydramine-acetaminophen (TYLENOL PM) 25-500 MG TABS tablet Take 1.5 tablets by mouth at bedtime.     docusate sodium (COLACE) 100 MG capsule Take 1 capsule (100 mg total) by mouth 2 (two) times daily. 10 capsule 0  KEPPRA 500 MG tablet Take 1 tablet (500 mg total) by mouth 2 (two) times daily. 60 tablet 11   Magnesium 400 MG TABS Take 400 mg by mouth at bedtime.     MAXALT-MLT 10 MG disintegrating tablet DISSOLVE ONE TABLET BY MOUTH AS NEEDED HEADACHE. 9 tablet 11   Melatonin 10 MG TABS Take 10 mg by mouth at bedtime as needed (for sleep).     NEURONTIN 300 MG capsule Take 1 capsule at bedtime 30 capsule 6   omeprazole (PRILOSEC OTC) 20 MG tablet Take 20 mg by mouth daily before breakfast.     oxybutynin (DITROPAN-XL) 10 MG 24 hr  tablet TAKE 1 TABLET(10 MG) BY MOUTH AT BEDTIME (Patient taking differently: Take 10 mg by mouth at bedtime.) 90 tablet 2   POTASSIUM PO Take 1 tablet by mouth daily with lunch.     pyridOXINE (VITAMIN B-6) 100 MG tablet Take 400 mg by mouth daily with lunch.     riboflavin (VITAMIN B-2) 100 MG TABS tablet Take 400 mg by mouth daily with lunch.     tretinoin (RETIN-A) 0.1 % cream APPLY TO AFFECTED AREA EVERY DAY AT BEDTIME (Patient taking differently: Apply 1 application topically at bedtime.) 45 g 2   verapamil (VERELAN PM) 180 MG 24 hr capsule TAKE 1 CAPSULE(180 MG) BY MOUTH DAILY (Patient taking differently: Take 180 mg by mouth daily.) 90 capsule 3   Vitamin D, Ergocalciferol, (DRISDOL) 1.25 MG (50000 UNIT) CAPS capsule TAKE 1 CAPSULE BY MOUTH EVERY 2 WEEKS 7 capsule 2   No current facility-administered medications on file prior to visit.    Past Medical History:  Diagnosis Date   Anxiety    Arthritis    Chronic insomnia 03/30/2015   Chronic low back pain 12/02/2016   Depression    GERD (gastroesophageal reflux disease)    Hiatal hernia    High cholesterol    Hypertelorism    Hypertension    Meningioma (HCC)    Migraine    Allergies  Allergen Reactions   Dihydrotachysterol Other (See Comments)    Dihydrotachysterol is a form of vitamin D. Might be "DHT Intensol" (Name Brand)- Reaction unknown- tolerating 50,000 units of Vitamin D-2 (Drisdol) in 2022   Trazodone And Nefazodone Other (See Comments)    Worsens headaches   Valium [Diazepam] Other (See Comments)    headaches   Citalopram Other (See Comments)    Other reaction(s): Headache   Other Other (See Comments)    "States all antidepressants cause migraines." - tolerates Xanax     Social History   Socioeconomic History   Marital status: Widowed    Spouse name: Not on file   Number of children: 4   Years of education: 43   Highest education level: Not on file  Occupational History   Occupation: Retired  Tobacco Use    Smoking status: Never   Smokeless tobacco: Never  Vaping Use   Vaping Use: Never used  Substance and Sexual Activity   Alcohol use: No   Drug use: No   Sexual activity: Not Currently  Other Topics Concern   Not on file  Social History Narrative   Lives in [place]      Patient drinks 1 glass of tea daily.      Patient is right handed.   Social Determinants of Health   Financial Resource Strain: Not on file  Food Insecurity: Not on file  Transportation Needs: Not on file  Physical Activity: Not on file  Stress: Not on file  Social Connections: Not on file   Vitals:   10/28/21 0948  BP: 120/70  Pulse: 77  Resp: 16  SpO2: 99%   Body mass index is 26.04 kg/m.  Physical Exam Vitals and nursing note reviewed.  Constitutional:      General: She is not in acute distress.    Appearance: She is well-developed.  HENT:     Head: Normocephalic and atraumatic.     Mouth/Throat:     Mouth: Mucous membranes are moist.     Pharynx: Oropharynx is clear.  Eyes:     Conjunctiva/sclera: Conjunctivae normal.  Cardiovascular:     Rate and Rhythm: Normal rate and regular rhythm.     Heart sounds: No murmur heard. Pulmonary:     Effort: Pulmonary effort is normal. No respiratory distress.     Breath sounds: Normal breath sounds.  Abdominal:     Palpations: Abdomen is soft.     Tenderness: There is no abdominal tenderness.  Musculoskeletal:     Right lower leg: No edema.     Left lower leg: No edema.  Skin:    General: Skin is warm.     Findings: No erythema or rash.  Neurological:     General: No focal deficit present.     Mental Status: She is alert and oriented to person, place, and time.     Cranial Nerves: No cranial nerve deficit.     Comments: Unstable, antalgic gait assisted with a walker.  Psychiatric:        Mood and Affect: Mood is anxious.     Comments: Well groomed, good eye contact.   ASSESSMENT AND PLAN:  [name] was seen today for  follow-up.  Diagnoses and all orders for this visit: Orders Placed This Encounter  Procedures   Basic metabolic panel   CBC   VITAMIN D 25 Hydroxy (Vit-D Deficiency, Fractures)   Lab Results  Component Value Date   WBC 5.9 10/28/2021   HGB 13.1 10/28/2021   HCT 39.9 10/28/2021   MCV 96.5 10/28/2021   PLT 248.0 10/28/2021   Lab Results  Component Value Date   CREATININE 0.94 10/28/2021   BUN 16 10/28/2021   NA 136 10/28/2021   K 4.2 10/28/2021   CL 100 10/28/2021   CO2 29 10/28/2021   Dysuria She did not reported this problem during visit, apparently she did so when she was having labs done. Treated for UTI during hospitalization in 04/2021. Further recommendations will be given according to Ucx.  Essential hypertension BP adequately controlled. Continue Verapamil 180 mg daily and low salt diet. Continue monitoring BP regularly.  Lumbar radiculopathy Continue Neurontin as prescribed, she would like generic.Recommend discussing this with her neurologist.  Atherosclerosis of aorta ([place]) We discussed imaging findings and some CV benefits of statins. She would like to start statin medication, Pravastatin 20 mg daily added today. LDL < 100 would be appropriate for her.  Vitamin D deficiency, unspecified Continue Ergocalciferol 50,000 U q 2 weeks. Further recommendations according to 25 OH vit D result.  Iron deficiency anemia Continue Fe sulfate 325 mg daily. Further recommendations according to CBC result.  Generalized anxiety disorder Stable. No changes in Xanax dose. Side effects discussed, she has been on medication for years and well tolerated. [place] controlled subs report reviewed.  Return in about 4 months (around 02/25/2022).  Kacy Conely G. Martinique, MD  [facility]. [place] office.

## 2021-10-28 ENCOUNTER — Ambulatory Visit (INDEPENDENT_AMBULATORY_CARE_PROVIDER_SITE_OTHER): Payer: Medicare Other | Admitting: Family Medicine

## 2021-10-28 ENCOUNTER — Encounter: Payer: Self-pay | Admitting: Family Medicine

## 2021-10-28 VITALS — BP 120/70 | HR 77 | Resp 16 | Ht 62.0 in | Wt 142.4 lb

## 2021-10-28 DIAGNOSIS — E559 Vitamin D deficiency, unspecified: Secondary | ICD-10-CM

## 2021-10-28 DIAGNOSIS — F411 Generalized anxiety disorder: Secondary | ICD-10-CM

## 2021-10-28 DIAGNOSIS — I10 Essential (primary) hypertension: Secondary | ICD-10-CM

## 2021-10-28 DIAGNOSIS — Z20828 Contact with and (suspected) exposure to other viral communicable diseases: Secondary | ICD-10-CM | POA: Diagnosis not present

## 2021-10-28 DIAGNOSIS — I700 Atherosclerosis of aorta: Secondary | ICD-10-CM | POA: Diagnosis not present

## 2021-10-28 DIAGNOSIS — R300 Dysuria: Secondary | ICD-10-CM | POA: Diagnosis not present

## 2021-10-28 DIAGNOSIS — M5416 Radiculopathy, lumbar region: Secondary | ICD-10-CM | POA: Diagnosis not present

## 2021-10-28 DIAGNOSIS — D509 Iron deficiency anemia, unspecified: Secondary | ICD-10-CM

## 2021-10-28 LAB — URINALYSIS, ROUTINE W REFLEX MICROSCOPIC
Bilirubin Urine: NEGATIVE
Ketones, ur: NEGATIVE
Nitrite: POSITIVE — AB
Specific Gravity, Urine: 1.025 (ref 1.000–1.030)
Total Protein, Urine: NEGATIVE
Urine Glucose: NEGATIVE
Urobilinogen, UA: 0.2 (ref 0.0–1.0)
pH: 5.5 (ref 5.0–8.0)

## 2021-10-28 LAB — BASIC METABOLIC PANEL
BUN: 16 mg/dL (ref 6–23)
CO2: 29 mEq/L (ref 19–32)
Calcium: 9.2 mg/dL (ref 8.4–10.5)
Chloride: 100 mEq/L (ref 96–112)
Creatinine, Ser: 0.94 mg/dL (ref 0.40–1.20)
GFR: 55.59 mL/min — ABNORMAL LOW (ref >60.00)
Glucose, Bld: 84 mg/dL (ref 70–99)
Potassium: 4.2 mEq/L (ref 3.5–5.1)
Sodium: 136 mEq/L (ref 135–145)

## 2021-10-28 LAB — CBC
HCT: 39.9 % (ref 36.0–46.0)
Hemoglobin: 13.1 g/dL (ref 12.0–15.0)
MCHC: 32.9 g/dL (ref 30.0–36.0)
MCV: 96.5 fl (ref 78.0–100.0)
Platelets: 248 10*3/uL (ref 150.0–400.0)
RBC: 4.14 Mil/uL (ref 3.87–5.11)
RDW: 13.2 % (ref 11.5–15.5)
WBC: 5.9 10*3/uL (ref 4.0–10.5)

## 2021-10-28 LAB — VITAMIN D 25 HYDROXY (VIT D DEFICIENCY, FRACTURES): VITD: 27.05 ng/mL — ABNORMAL LOW (ref 30.00–100.00)

## 2021-10-28 MED ORDER — PRAVASTATIN SODIUM 20 MG PO TABS: 20.0000 mg | ORAL_TABLET | Freq: Every day | ORAL | 1 refills | Status: DC

## 2021-10-28 MED ORDER — ALPRAZOLAM 1 MG PO TABS: ORAL_TABLET | ORAL | 2 refills | Status: DC

## 2021-10-28 NOTE — Assessment & Plan Note (Signed)
We discussed imaging findings and some CV benefits of statins. She would like to start statin medication, Pravastatin 20 mg daily added today. LDL < 100 would be appropriate for her.

## 2021-10-28 NOTE — Assessment & Plan Note (Signed)
Continue Fe sulfate 325 mg daily. Further recommendations according to CBC result.

## 2021-10-28 NOTE — Assessment & Plan Note (Signed)
BP adequately controlled. Continue Verapamil 180 mg daily and low salt diet. Continue monitoring BP regularly.

## 2021-10-28 NOTE — Assessment & Plan Note (Signed)
Continue Neurontin as prescribed, she would like generic.Recommend discussing this with her neurologist.

## 2021-10-28 NOTE — Assessment & Plan Note (Signed)
Stable. No changes in Xanax dose. Side effects discussed, she has been on medication for years and well tolerated. [place] controlled subs report reviewed.

## 2021-10-28 NOTE — Assessment & Plan Note (Signed)
Continue Ergocalciferol 50,000 U q 2 weeks. Further recommendations according to 25 OH vit D result.

## 2021-10-28 NOTE — Patient Instructions (Signed)
A few things to remember from today's visit:   Essential hypertension - Plan: Basic metabolic panel  Generalized anxiety disorder - Plan: ALPRAZolam (XANAX) 1 MG tablet  Atherosclerosis of aorta (HCC)  Iron deficiency anemia, unspecified iron deficiency anemia type - Plan: CBC  Vitamin D deficiency, unspecified - Plan: VITAMIN D 25 Hydroxy (Vit-D Deficiency, Fractures)  If you need refills please call your pharmacy. Do not use My Chart to request refills or for acute issues that need immediate attention.   Today Pravastatin 20 mg added to take with supper. No changes in rest. Continue following with neurologist for your neuropathy,seizures,and migraine.  Please be sure medication list is accurate. If a new problem present, please set up appointment sooner than planned today.

## 2021-10-29 DIAGNOSIS — M79652 Pain in left thigh: Secondary | ICD-10-CM | POA: Diagnosis not present

## 2021-10-29 DIAGNOSIS — R2689 Other abnormalities of gait and mobility: Secondary | ICD-10-CM | POA: Diagnosis not present

## 2021-10-29 DIAGNOSIS — M79662 Pain in left lower leg: Secondary | ICD-10-CM | POA: Diagnosis not present

## 2021-10-29 DIAGNOSIS — M6281 Muscle weakness (generalized): Secondary | ICD-10-CM | POA: Diagnosis not present

## 2021-10-30 DIAGNOSIS — Z1159 Encounter for screening for other viral diseases: Secondary | ICD-10-CM | POA: Diagnosis not present

## 2021-10-30 DIAGNOSIS — Z20828 Contact with and (suspected) exposure to other viral communicable diseases: Secondary | ICD-10-CM | POA: Diagnosis not present

## 2021-10-30 LAB — URINE CULTURE
MICRO NUMBER:: 12844784
SPECIMEN QUALITY:: ADEQUATE

## 2021-10-31 ENCOUNTER — Encounter: Payer: Self-pay | Admitting: Family Medicine

## 2021-10-31 DIAGNOSIS — M6281 Muscle weakness (generalized): Secondary | ICD-10-CM | POA: Diagnosis not present

## 2021-10-31 DIAGNOSIS — M79652 Pain in left thigh: Secondary | ICD-10-CM | POA: Diagnosis not present

## 2021-10-31 DIAGNOSIS — R2689 Other abnormalities of gait and mobility: Secondary | ICD-10-CM | POA: Diagnosis not present

## 2021-10-31 DIAGNOSIS — M79662 Pain in left lower leg: Secondary | ICD-10-CM | POA: Diagnosis not present

## 2021-10-31 MED ORDER — AMOXICILLIN-POT CLAVULANATE 875-125 MG PO TABS: 1.0000 | ORAL_TABLET | Freq: Two times a day (BID) | ORAL | 0 refills | Status: AC

## 2021-10-31 MED ORDER — CHOLECALCIFEROL 1.25 MG (50000 UT) PO CAPS: 50000.0000 [IU] | ORAL_CAPSULE | ORAL | 1 refills | Status: DC

## 2021-11-03 ENCOUNTER — Other Ambulatory Visit: Payer: Self-pay | Admitting: Family Medicine

## 2021-11-03 DIAGNOSIS — N393 Stress incontinence (female) (male): Secondary | ICD-10-CM

## 2021-11-04 DIAGNOSIS — Z20828 Contact with and (suspected) exposure to other viral communicable diseases: Secondary | ICD-10-CM | POA: Diagnosis not present

## 2021-11-07 DIAGNOSIS — M79662 Pain in left lower leg: Secondary | ICD-10-CM | POA: Diagnosis not present

## 2021-11-07 DIAGNOSIS — M79652 Pain in left thigh: Secondary | ICD-10-CM | POA: Diagnosis not present

## 2021-11-07 DIAGNOSIS — R2689 Other abnormalities of gait and mobility: Secondary | ICD-10-CM | POA: Diagnosis not present

## 2021-11-07 DIAGNOSIS — M6281 Muscle weakness (generalized): Secondary | ICD-10-CM | POA: Diagnosis not present

## 2021-11-11 DIAGNOSIS — Z20828 Contact with and (suspected) exposure to other viral communicable diseases: Secondary | ICD-10-CM | POA: Diagnosis not present

## 2021-11-12 DIAGNOSIS — R2689 Other abnormalities of gait and mobility: Secondary | ICD-10-CM | POA: Diagnosis not present

## 2021-11-12 DIAGNOSIS — M6281 Muscle weakness (generalized): Secondary | ICD-10-CM | POA: Diagnosis not present

## 2021-11-12 DIAGNOSIS — M79662 Pain in left lower leg: Secondary | ICD-10-CM | POA: Diagnosis not present

## 2021-11-12 DIAGNOSIS — M79652 Pain in left thigh: Secondary | ICD-10-CM | POA: Diagnosis not present

## 2021-11-14 DIAGNOSIS — R2689 Other abnormalities of gait and mobility: Secondary | ICD-10-CM | POA: Diagnosis not present

## 2021-11-14 DIAGNOSIS — M79652 Pain in left thigh: Secondary | ICD-10-CM | POA: Diagnosis not present

## 2021-11-14 DIAGNOSIS — M79662 Pain in left lower leg: Secondary | ICD-10-CM | POA: Diagnosis not present

## 2021-11-14 DIAGNOSIS — M6281 Muscle weakness (generalized): Secondary | ICD-10-CM | POA: Diagnosis not present

## 2021-11-18 ENCOUNTER — Ambulatory Visit (INDEPENDENT_AMBULATORY_CARE_PROVIDER_SITE_OTHER): Payer: Medicare Other

## 2021-11-18 VITALS — Ht 62.0 in | Wt 142.0 lb

## 2021-11-18 DIAGNOSIS — Z20828 Contact with and (suspected) exposure to other viral communicable diseases: Secondary | ICD-10-CM | POA: Diagnosis not present

## 2021-11-18 DIAGNOSIS — Z0000 Encounter for general adult medical examination without abnormal findings: Secondary | ICD-10-CM

## 2021-11-18 NOTE — Progress Notes (Signed)
Subjective:   Tracey Armstrong is a 87 y.o. female who presents for Medicare Annual (Subsequent) preventive examination.  Virtual Visit via Telephone Note  I connected with  Tracey Armstrong on 11/18/21 at  2:30 PM EST by telephone and verified that I am speaking with the correct person using two identifiers.  Location: Patient: Home Provider: Office Persons participating in the virtual visit: patient/Nurse Health Advisor   I discussed the limitations, risks, security and privacy concerns of performing an evaluation and management service by telephone and the availability of in person appointments. The patient expressed understanding and agreed to proceed.  Interactive audio and video telecommunications were attempted between this nurse and patient, however failed, due to patient having technical difficulties OR patient did not have access to video capability.  We continued and completed visit with audio only.  Some vital signs may be absent or patient reported.   [name], LPN   Review of Systems     Cardiac Risk Factors include: advanced age (>41men, >77 women);dyslipidemia;hypertension     Objective:    Today's Vitals   11/18/21 1433  Weight: 142 lb (64.4 kg)  Height: 5\' 2"  (1.575 m)  PainSc: 4    Body mass index is 25.97 kg/m.  Advanced Directives 11/18/2021 05/02/2021 04/30/2021 06/02/2020 04/04/2020 03/30/2015  Does Patient Have a Medical Advance Directive? Yes Yes Yes No No;Yes Yes  Type of Advance Directive Healthcare Power of [place] of facility DNR (pink MOST or yellow form);Living will [place];Out of facility DNR (pink MOST or yellow form);Living will - Living will [place];Living will  Copy of [place] in Chart? Yes - validated most recent copy scanned in chart (See row information) - - - - -  Would patient like information on creating a medical advance directive? - - - No - Patient declined - -     Current Medications (verified) Outpatient Encounter Medications as of 11/18/2021  Medication Sig   ALPRAZolam (XANAX) 1 MG tablet TAKE 1/2 TABLET BY MOUTH AT NOON AND 1 TABLET BY MOUTH EVERY NIGHT AT BEDTIME   antiseptic oral rinse (BIOTENE) LIQD Use 3 mls 3-4 times per day for dry mouth. Do not swallow.   Cholecalciferol 1.25 MG (50000 UT) capsule Take 1 capsule (50,000 Units total) by mouth every 14 (fourteen) days.   Cyanocobalamin (VITAMIN B-12 PO) Take 1 tablet by mouth daily with lunch.   Dextran 70-Hypromellose (ARTIFICIAL TEARS PF OP) Place 1-2 drops into both eyes See admin instructions. Instill 1-2 drops into both eyes at bedtime and an additional 2-3 times a day as needed for dryness   diphenhydramine-acetaminophen (TYLENOL PM) 25-500 MG TABS tablet Take 1.5 tablets by mouth at bedtime.   docusate sodium (COLACE) 100 MG capsule Take 1 capsule (100 mg total) by mouth 2 (two) times daily.   KEPPRA 500 MG tablet Take 1 tablet (500 mg total) by mouth 2 (two) times daily.   Magnesium 400 MG TABS Take 400 mg by mouth at bedtime.   MAXALT-MLT 10 MG disintegrating tablet DISSOLVE ONE TABLET BY MOUTH AS NEEDED HEADACHE.   Melatonin 10 MG TABS Take 10 mg by mouth at bedtime as needed (for sleep).   NEURONTIN 300 MG capsule Take 1 capsule at bedtime   omeprazole (PRILOSEC OTC) 20 MG tablet Take 20 mg by mouth daily before breakfast.   oxybutynin (DITROPAN-XL) 10 MG 24 hr tablet TAKE 1 TABLET(10 MG) BY MOUTH AT BEDTIME   POTASSIUM PO  Take 1 tablet by mouth daily with lunch.   pravastatin (PRAVACHOL) 20 MG tablet Take 1 tablet (20 mg total) by mouth daily.   pyridOXINE (VITAMIN B-6) 100 MG tablet Take 400 mg by mouth daily with lunch.   riboflavin (VITAMIN B-2) 100 MG TABS tablet Take 400 mg by mouth daily with lunch.   tretinoin (RETIN-A) 0.1 % cream APPLY TO AFFECTED AREA EVERY DAY AT BEDTIME (Patient taking differently: Apply 1 application topically at bedtime.)   verapamil (VERELAN PM)  180 MG 24 hr capsule TAKE 1 CAPSULE(180 MG) BY MOUTH DAILY (Patient taking differently: Take 180 mg by mouth daily.)   No facility-administered encounter medications on file as of 11/18/2021.    Allergies (verified) Dihydrotachysterol, Trazodone and nefazodone, Valium [diazepam], Citalopram, and Other   History: Past Medical History:  Diagnosis Date   Anxiety    Arthritis    Chronic insomnia 03/30/2015   Chronic low back pain 12/02/2016   Depression    GERD (gastroesophageal reflux disease)    Hiatal hernia    High cholesterol    Hypertelorism    Hypertension    Meningioma (HCC)    Migraine    Past Surgical History:  Procedure Laterality Date   FEMUR IM NAIL Left 06/02/2020   Procedure: INTRAMEDULLARY (IM) RETROGRADE FEMORAL NAILING - LEFT;  Surgeon: Rod Can, MD;  Location: [facility];  Service: Orthopedics;  Laterality: Left;   STOMACH SURGERY     Family History  Problem Relation Age of Onset   Heart Problems Mother    Migraines Father    Stroke Father    Epilepsy Brother    Cancer Brother 48       unknown type - metastatic   Social History   Socioeconomic History   Marital status: Widowed    Spouse name: Not on file   Number of children: 4   Years of education: 64   Highest education level: Not on file  Occupational History   Occupation: Retired  Tobacco Use   Smoking status: Never   Smokeless tobacco: Never  Vaping Use   Vaping Use: Never used  Substance and Sexual Activity   Alcohol use: No   Drug use: No   Sexual activity: Not Currently  Other Topics Concern   Not on file  Social History Narrative   Lives in [place] [facility]      Patient drinks 1 glass of tea daily.      Patient is right handed.   Social Determinants of Health   Financial Resource Strain: Low Risk    Difficulty of Paying Living Expenses: Not hard at all  Food Insecurity: No Food Insecurity   Worried About Charity fundraiser in the Last Year: Never true   [place]  in the Last Year: Never true  Transportation Needs: No Transportation Needs   Lack of Transportation (Medical): No   Lack of Transportation (Non-Medical): No  Physical Activity: Insufficiently Active   Days of Exercise per Week: 7 days   Minutes of Exercise per Session: 20 min  Stress: No Stress Concern Present   Feeling of Stress : Not at all  Social Connections: Moderately Integrated   Frequency of Communication with Friends and Family: More than three times a week   Frequency of Social Gatherings with Friends and Family: More than three times a week   Attends Religious Services: 1 to 4 times per year   Active Member of [employer] or Organizations: Yes  Attends Archivist Meetings: 1 to 4 times per year   Marital Status: Widowed    Tobacco Counseling Counseling given: Not Answered   Clinical Intake:  Pre-visit preparation completed: Yes  Pain : 0-10 Pain Score: 4  Pain Type: Chronic pain Pain Location: Back Pain Descriptors / Indicators: Aching Pain Onset: More than a month ago Pain Frequency: Intermittent     BMI - recorded: 25.97 Nutritional Status: BMI 25 -29 Overweight Nutritional Risks: None Diabetes: No  How often do you need to have someone help you when you read instructions, pamphlets, or other written materials from your doctor or pharmacy?: 1 - Never  Diabetic? no  Interpreter Needed?: No  Information entered by :: Amy Hopkins, LPN   Activities of Daily Living In your present state of health, do you have any difficulty performing the following activities: 11/18/2021 10/28/2021  Hearing? N N  Vision? N N  Difficulty concentrating or making decisions? N N  Walking or climbing stairs? Y N  Comment uses elevator - uses walker -  Dressing or bathing? N N  Comment she does have someone monitor just in case of fall -  Doing errands, shopping? Y N  Comment unable to drive with leg and back pain -  Preparing Food and eating ? N -  Using the Toilet?  N -  In the past six months, have you accidently leaked urine? Y -  Comment wears pads for protection -  Do you have problems with loss of bowel control? N -  Managing your Medications? N -  Managing your Finances? N -  Housekeeping or managing your Housekeeping? Y -  Some recent data might be hidden    Patient Care Team: [place], [name], MD as PCP - General (Family Medicine) Cameron Sprang, MD as Consulting Physician (Neurology) Magnus Sinning, MD as Consulting Physician (Physical Medicine and Rehabilitation) Melissa Noon, [place] as Referring Physician (Optometry)  Indicate any recent Medical Services you may have received from other than [facility] providers in the past year (date may be approximate).     Assessment:   This is a routine wellness examination for Tracey Armstrong.  Hearing/Vision screen Hearing Screening - Comments:: Denies hearing difficulties   Vision Screening - Comments:: Wears rx glasses - needs new rx - behind with routine eye exams with  [facility]  Dietary issues and exercise activities discussed: Current Exercise Habits: Home exercise routine, Type of exercise: walking;stretching, Time (Minutes): 20, Frequency (Times/Week): 7, Weekly Exercise (Minutes/Week): 140, Intensity: Mild, Exercise limited by: orthopedic condition(s)   Goals Addressed             This Visit's Progress    Patient Stated   On track    Will take care of you and enjoy your new environment  Would like to be able to walk without walker       Depression Screen PHQ 2/9 Scores 11/18/2021 10/28/2021 01/06/2018  PHQ - 2 Score 0 0 1    Fall Risk Fall Risk  11/18/2021 10/28/2021 07/26/2021 04/30/2021 09/14/2019  Falls in the past year? 0 0 1 1 1   Comment - - - - Emmi Telephone Survey: data to providers prior to load  Number falls in past yr: 0 - 0 1 1  Comment - - - - Emmi Telephone Survey Actual Response = 6  Injury with Fall? 0 - 0 1 1  Risk for fall due to : Impaired balance/gait;Medication side  effect;Orthopedic patient;Other (Comment) - - - -  Risk  for fall due to: Comment seizures - - - -  Follow up Education provided;Falls prevention discussed - Education provided - -    FALL RISK PREVENTION PERTAINING TO THE HOME:  Any stairs in or around the home? Yes  If so, are there any without handrails? No  Home free of loose throw rugs in walkways, pet beds, electrical cords, etc? Yes  Adequate lighting in your home to reduce risk of falls? Yes   ASSISTIVE DEVICES UTILIZED TO PREVENT FALLS:  Life alert? Yes  Use of a cane, walker or w/c? Yes  Grab bars in the bathroom? Yes  Shower chair or bench in shower? Yes  Elevated toilet seat or a handicapped toilet? Yes   TIMED UP AND GO:  Was the test performed? No . Telephone visit.  Cognitive Function: MMSE - Mini Mental State Exam 04/30/2021 01/06/2018  Not completed: - (No Data)  Orientation to time 5 -  Orientation to Place 5 -  Registration 3 -  Attention/ Calculation 5 -  Recall 2 -  Language- name 2 objects 2 -  Language- repeat 1 -  Language- follow 3 step command 3 -  Language- read & follow direction 1 -  Write a sentence 1 -  Copy design 1 -  Total score 29 -     6CIT Screen 11/18/2021  What Year? 0 points  What month? 0 points  What time? 0 points  Count back from 20 0 points  Months in reverse 2 points  Repeat phrase 2 points  Total Score 4    Immunizations Immunization History  Administered Date(s) Administered   Tdap 08/30/2014    TDAP status: Up to date  Flu Vaccine status: Declined, Education has been provided regarding the importance of this vaccine but patient still declined. Advised may receive this vaccine at local pharmacy or Health Dept. Aware to provide a copy of the vaccination record if obtained from local pharmacy or Health Dept. Verbalized acceptance and understanding.  Pneumococcal vaccine status: Declined,  Education has been provided regarding the importance of this vaccine but  patient still declined. Advised may receive this vaccine at local pharmacy or Health Dept. Aware to provide a copy of the vaccination record if obtained from local pharmacy or Health Dept. Verbalized acceptance and understanding.   Covid-19 vaccine status: Completed vaccines  Qualifies for Shingles Vaccine? Yes   Zostavax completed No     Screening Tests Health Maintenance  Topic Date Due   COVID-19 Vaccine (1) Never done   Zoster Vaccines- Shingrix (1 of 2) Never done   Pneumonia Vaccine 24+ Years old (1 - PCV) Never done   TETANUS/TDAP  08/30/2024   DEXA SCAN  Completed   HPV VACCINES  Aged Out   INFLUENZA VACCINE  Discontinued    Health Maintenance  Health Maintenance Due  Topic Date Due   COVID-19 Vaccine (1) Never done   Zoster Vaccines- Shingrix (1 of 2) Never done   Pneumonia Vaccine 36+ Years old (1 - PCV) Never done    Colorectal cancer screening: No longer required.   Mammogram status: No longer required due to age.  Bone Density status: Completed 01/22/2018. Results reflect: Bone density results: OSTEOPOROSIS. Repeat every 2 years.  Lung Cancer Screening: (Low Dose CT Chest recommended if Age 3-80 years, 30 pack-year currently smoking OR have quit w/in 15years.) does not qualify.   Additional Screening:  Hepatitis C Screening: does not qualify  Vision Screening: Recommended annual ophthalmology exams for early detection of  glaucoma and other disorders of the eye. Is the patient up to date with their annual eye exam?  No  Who is the provider or what is the name of the office in which the patient attends annual eye exams? [facility] If pt is not established with a provider, would they like to be referred to a provider to establish care? No .   Dental Screening: Recommended annual dental exams for proper oral hygiene  Community Resource Referral / Chronic Care Management: CRR required this visit?  No   CCM required this visit?  No      Plan:     I have  personally reviewed and noted the following in the patients chart:   Medical and social history Use of alcohol, tobacco or illicit drugs  Current medications and supplements including opioid prescriptions.  Functional ability and status Nutritional status Physical activity Advanced directives List of other physicians Hospitalizations, surgeries, and ER visits in previous 12 months Vitals Screenings to include cognitive, depression, and falls Referrals and appointments  In addition, I have reviewed and discussed with patient certain preventive protocols, quality metrics, and best practice recommendations. A written personalized care plan for preventive services as well as general preventive health recommendations were provided to patient.     [name], LPN   [id number]   Nurse Notes: None

## 2021-11-18 NOTE — Patient Instructions (Addendum)
Ms. Curtiss , Thank you for taking time to come for your Medicare Wellness Visit. I appreciate your ongoing commitment to your health goals. Please review the following plan we discussed and let me know if I can assist you in the future.   Screening recommendations/referrals: Colonoscopy: No longer required Mammogram: No longer required Bone Density: Done 01/22/2018 - Repeat every 2 years if pcp recommends Recommended yearly ophthalmology/optometry visit for glaucoma screening and checkup Recommended yearly dental visit for hygiene and checkup  Vaccinations: Influenza vaccine: Declined Pneumococcal vaccine: Declined Tdap vaccine: Done 08/30/2014 - Repeat in 10 years  Shingles vaccine: Due   Covid-19: Done x4 - we need dates please  Advanced directives: in chart  Conditions/risks identified: Aim for 30 minutes of exercise or brisk walking each day, drink 6-8 glasses of water and eat lots of fruits and vegetables. Work on fall prevention.  Next appointment: Follow up in one year for your annual wellness visit    Preventive Care 65 Years and Older, Female Preventive care refers to lifestyle choices and visits with your health care provider that can promote health and wellness. What does preventive care include? A yearly physical exam. This is also called an annual well check. Dental exams once or twice a year. Routine eye exams. Ask your health care provider how often you should have your eyes checked. Personal lifestyle choices, including: Daily care of your teeth and gums. Regular physical activity. Eating a healthy diet. Avoiding tobacco and drug use. Limiting alcohol use. Practicing safe sex. Taking low-dose aspirin every day. Taking vitamin and mineral supplements as recommended by your health care provider. What happens during an annual well check? The services and screenings done by your health care provider during your annual well check will depend on your age, overall  health, lifestyle risk factors, and family history of disease. Counseling  Your health care provider may ask you questions about your: Alcohol use. Tobacco use. Drug use. Emotional well-being. Home and relationship well-being. Sexual activity. Eating habits. History of falls. Memory and ability to understand (cognition). Work and work Statistician. Reproductive health. Screening  You may have the following tests or measurements: Height, weight, and BMI. Blood pressure. Lipid and cholesterol levels. These may be checked every 5 years, or more frequently if you are over 56 years old. Skin check. Lung cancer screening. You may have this screening every year starting at age 61 if you have a 30-pack-year history of smoking and currently smoke or have quit within the past 15 years. Fecal occult blood test (FOBT) of the stool. You may have this test every year starting at age 74. Flexible sigmoidoscopy or colonoscopy. You may have a sigmoidoscopy every 5 years or a colonoscopy every 10 years starting at age 75. Hepatitis C blood test. Hepatitis B blood test. Sexually transmitted disease (STD) testing. Diabetes screening. This is done by checking your blood sugar (glucose) after you have not eaten for a while (fasting). You may have this done every 1-3 years. Bone density scan. This is done to screen for osteoporosis. You may have this done starting at age 87. Mammogram. This may be done every 1-2 years. Talk to your health care provider about how often you should have regular mammograms. Talk with your health care provider about your test results, treatment options, and if necessary, the need for more tests. Vaccines  Your health care provider may recommend certain vaccines, such as: Influenza vaccine. This is recommended every year. Tetanus, diphtheria, and acellular pertussis (Tdap, Td)  vaccine. You may need a Td booster every 10 years. Zoster vaccine. You may need this after age  59. Pneumococcal 13-valent conjugate (PCV13) vaccine. One dose is recommended after age 35. Pneumococcal polysaccharide (PPSV23) vaccine. One dose is recommended after age 18. Talk to your health care provider about which screenings and vaccines you need and how often you need them. This information is not intended to replace advice given to you by your health care provider. Make sure you discuss any questions you have with your health care provider. Document Released: 11/02/2015 Document Revised: 06/25/2016 Document Reviewed: 08/07/2015 Elsevier Interactive Patient Education  2017 [place] Prevention in the Home Falls can cause injuries. They can happen to people of all ages. There are many things you can do to make your home safe and to help prevent falls. What can I do on the outside of my home? Regularly fix the edges of walkways and driveways and fix any cracks. Remove anything that might make you trip as you walk through a door, such as a raised step or threshold. Trim any bushes or trees on the path to your home. Use bright outdoor lighting. Clear any walking paths of anything that might make someone trip, such as rocks or tools. Regularly check to see if handrails are loose or broken. Make sure that both sides of any steps have handrails. Any raised decks and porches should have guardrails on the edges. Have any leaves, snow, or ice cleared regularly. Use sand or salt on walking paths during winter. Clean up any spills in your garage right away. This includes oil or grease spills. What can I do in the bathroom? Use night lights. Install grab bars by the toilet and in the tub and shower. Do not use towel bars as grab bars. Use non-skid mats or decals in the tub or shower. If you need to sit down in the shower, use a plastic, non-slip stool. Keep the floor dry. Clean up any water that spills on the floor as soon as it happens. Remove soap buildup in the tub or shower  regularly. Attach bath mats securely with double-sided non-slip rug tape. Do not have throw rugs and other things on the floor that can make you trip. What can I do in the bedroom? Use night lights. Make sure that you have a light by your bed that is easy to reach. Do not use any sheets or blankets that are too big for your bed. They should not hang down onto the floor. Have a firm chair that has side arms. You can use this for support while you get dressed. Do not have throw rugs and other things on the floor that can make you trip. What can I do in the kitchen? Clean up any spills right away. Avoid walking on wet floors. Keep items that you use a lot in easy-to-reach places. If you need to reach something above you, use a strong step stool that has a grab bar. Keep electrical cords out of the way. Do not use floor polish or wax that makes floors slippery. If you must use wax, use non-skid floor wax. Do not have throw rugs and other things on the floor that can make you trip. What can I do with my stairs? Do not leave any items on the stairs. Make sure that there are handrails on both sides of the stairs and use them. Fix handrails that are broken or loose. Make sure that handrails are as long  as the stairways. Check any carpeting to make sure that it is firmly attached to the stairs. Fix any carpet that is loose or worn. Avoid having throw rugs at the top or bottom of the stairs. If you do have throw rugs, attach them to the floor with carpet tape. Make sure that you have a light switch at the top of the stairs and the bottom of the stairs. If you do not have them, ask someone to add them for you. What else can I do to help prevent falls? Wear shoes that: Do not have high heels. Have rubber bottoms. Are comfortable and fit you well. Are closed at the toe. Do not wear sandals. If you use a stepladder: Make sure that it is fully opened. Do not climb a closed stepladder. Make sure that  both sides of the stepladder are locked into place. Ask someone to hold it for you, if possible. Clearly mark and make sure that you can see: Any grab bars or handrails. First and last steps. Where the edge of each step is. Use tools that help you move around (mobility aids) if they are needed. These include: Canes. Walkers. Scooters. Crutches. Turn on the lights when you go into a dark area. Replace any light bulbs as soon as they burn out. Set up your furniture so you have a clear path. Avoid moving your furniture around. If any of your floors are uneven, fix them. If there are any pets around you, be aware of where they are. Review your medicines with your doctor. Some medicines can make you feel dizzy. This can increase your chance of falling. Ask your doctor what other things that you can do to help prevent falls. This information is not intended to replace advice given to you by your health care provider. Make sure you discuss any questions you have with your health care provider. Document Released: 08/02/2009 Document Revised: 03/13/2016 Document Reviewed: 11/10/2014 Elsevier Interactive Patient Education  2017 [employer].

## 2021-11-19 DIAGNOSIS — M6281 Muscle weakness (generalized): Secondary | ICD-10-CM | POA: Diagnosis not present

## 2021-11-19 DIAGNOSIS — M79662 Pain in left lower leg: Secondary | ICD-10-CM | POA: Diagnosis not present

## 2021-11-19 DIAGNOSIS — R2689 Other abnormalities of gait and mobility: Secondary | ICD-10-CM | POA: Diagnosis not present

## 2021-11-19 DIAGNOSIS — M79652 Pain in left thigh: Secondary | ICD-10-CM | POA: Diagnosis not present

## 2021-11-22 DIAGNOSIS — M6281 Muscle weakness (generalized): Secondary | ICD-10-CM | POA: Diagnosis not present

## 2021-11-22 DIAGNOSIS — M79652 Pain in left thigh: Secondary | ICD-10-CM | POA: Diagnosis not present

## 2021-11-22 DIAGNOSIS — R2689 Other abnormalities of gait and mobility: Secondary | ICD-10-CM | POA: Diagnosis not present

## 2021-11-22 DIAGNOSIS — M79662 Pain in left lower leg: Secondary | ICD-10-CM | POA: Diagnosis not present

## 2021-11-25 ENCOUNTER — Encounter: Payer: Self-pay | Admitting: Neurology

## 2021-11-25 ENCOUNTER — Ambulatory Visit (INDEPENDENT_AMBULATORY_CARE_PROVIDER_SITE_OTHER): Payer: Medicare Other | Admitting: Neurology

## 2021-11-25 ENCOUNTER — Other Ambulatory Visit: Payer: Self-pay

## 2021-11-25 VITALS — BP 121/64 | HR 74 | Resp 18 | Ht 62.0 in | Wt 146.0 lb

## 2021-11-25 DIAGNOSIS — G43009 Migraine without aura, not intractable, without status migrainosus: Secondary | ICD-10-CM | POA: Diagnosis not present

## 2021-11-25 DIAGNOSIS — M5416 Radiculopathy, lumbar region: Secondary | ICD-10-CM

## 2021-11-25 DIAGNOSIS — R569 Unspecified convulsions: Secondary | ICD-10-CM | POA: Diagnosis not present

## 2021-11-25 MED ORDER — LEVETIRACETAM 500 MG PO TABS: 500.0000 mg | ORAL_TABLET | Freq: Two times a day (BID) | ORAL | 3 refills | Status: DC

## 2021-11-25 MED ORDER — GABAPENTIN 300 MG PO CAPS: ORAL_CAPSULE | ORAL | 3 refills | Status: DC

## 2021-11-25 NOTE — Patient Instructions (Signed)
Good to hear you are doing well. Refills have been sent for Levetiracetam 500mg : take 1 tablet twice a day, and for Gabapentin 300mg : take 1 capsule every afternoon. You can try Aspercreme for your foot pain. Follow-up in 6-8 months, call for any changes.   Seizure Precautions: 1. If medication has been prescribed for you to prevent seizures, take it exactly as directed.  Do not stop taking the medicine without talking to your doctor first, even if you have not had a seizure in a long time.   2. Avoid activities in which a seizure would cause danger to yourself or to others.  Don't operate dangerous machinery, swim alone, or climb in high or dangerous places, such as on ladders, roofs, or girders.  Do not drive unless your doctor says you may.  3. If you have any warning that you may have a seizure, lay down in a safe place where you can't hurt yourself.    4.  No driving for 6 months from last seizure, as per [facility].   Please refer to the following link on the [place] website for more information: http://[email or web address]/[email or web address]/[email or web address]   5.  Maintain good sleep hygiene.  6.  Contact your doctor if you have any problems that may be related to the medicine you are taking.  7.  Call 911 and bring the patient back to the ED if:        A.  The seizure lasts longer than 5 minutes.       B.  The patient doesn't awaken shortly after the seizure  C.  The patient has new problems such as difficulty seeing, speaking or moving  D.  The patient was injured during the seizure  E.  The patient has a temperature over 102 F (39C)  F.  The patient vomited and now is having trouble breathing
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
## 2021-11-26 DIAGNOSIS — M6281 Muscle weakness (generalized): Secondary | ICD-10-CM | POA: Diagnosis not present

## 2021-11-26 DIAGNOSIS — R2689 Other abnormalities of gait and mobility: Secondary | ICD-10-CM | POA: Diagnosis not present

## 2021-11-26 DIAGNOSIS — M79652 Pain in left thigh: Secondary | ICD-10-CM | POA: Diagnosis not present

## 2021-11-26 DIAGNOSIS — M79662 Pain in left lower leg: Secondary | ICD-10-CM | POA: Diagnosis not present

## 2021-11-28 DIAGNOSIS — R2689 Other abnormalities of gait and mobility: Secondary | ICD-10-CM | POA: Diagnosis not present

## 2021-11-28 DIAGNOSIS — M6281 Muscle weakness (generalized): Secondary | ICD-10-CM | POA: Diagnosis not present

## 2021-11-28 DIAGNOSIS — M79662 Pain in left lower leg: Secondary | ICD-10-CM | POA: Diagnosis not present

## 2021-11-28 DIAGNOSIS — M79652 Pain in left thigh: Secondary | ICD-10-CM | POA: Diagnosis not present

## 2021-12-03 DIAGNOSIS — M79662 Pain in left lower leg: Secondary | ICD-10-CM | POA: Diagnosis not present

## 2021-12-03 DIAGNOSIS — M6281 Muscle weakness (generalized): Secondary | ICD-10-CM | POA: Diagnosis not present

## 2021-12-03 DIAGNOSIS — M79652 Pain in left thigh: Secondary | ICD-10-CM | POA: Diagnosis not present

## 2021-12-03 DIAGNOSIS — R2689 Other abnormalities of gait and mobility: Secondary | ICD-10-CM | POA: Diagnosis not present

## 2021-12-05 DIAGNOSIS — M6281 Muscle weakness (generalized): Secondary | ICD-10-CM | POA: Diagnosis not present

## 2021-12-05 DIAGNOSIS — R2689 Other abnormalities of gait and mobility: Secondary | ICD-10-CM | POA: Diagnosis not present

## 2021-12-05 DIAGNOSIS — M79662 Pain in left lower leg: Secondary | ICD-10-CM | POA: Diagnosis not present

## 2021-12-05 DIAGNOSIS — M79652 Pain in left thigh: Secondary | ICD-10-CM | POA: Diagnosis not present

## 2021-12-10 DIAGNOSIS — R2689 Other abnormalities of gait and mobility: Secondary | ICD-10-CM | POA: Diagnosis not present

## 2021-12-10 DIAGNOSIS — M6281 Muscle weakness (generalized): Secondary | ICD-10-CM | POA: Diagnosis not present

## 2021-12-10 DIAGNOSIS — M79652 Pain in left thigh: Secondary | ICD-10-CM | POA: Diagnosis not present

## 2021-12-10 DIAGNOSIS — M79662 Pain in left lower leg: Secondary | ICD-10-CM | POA: Diagnosis not present

## 2021-12-12 DIAGNOSIS — M79652 Pain in left thigh: Secondary | ICD-10-CM | POA: Diagnosis not present

## 2021-12-12 DIAGNOSIS — M6281 Muscle weakness (generalized): Secondary | ICD-10-CM | POA: Diagnosis not present

## 2021-12-12 DIAGNOSIS — R2689 Other abnormalities of gait and mobility: Secondary | ICD-10-CM | POA: Diagnosis not present

## 2021-12-12 DIAGNOSIS — M79662 Pain in left lower leg: Secondary | ICD-10-CM | POA: Diagnosis not present

## 2021-12-17 DIAGNOSIS — R2689 Other abnormalities of gait and mobility: Secondary | ICD-10-CM | POA: Diagnosis not present

## 2021-12-17 DIAGNOSIS — M79662 Pain in left lower leg: Secondary | ICD-10-CM | POA: Diagnosis not present

## 2021-12-17 DIAGNOSIS — M79652 Pain in left thigh: Secondary | ICD-10-CM | POA: Diagnosis not present

## 2021-12-17 DIAGNOSIS — M6281 Muscle weakness (generalized): Secondary | ICD-10-CM | POA: Diagnosis not present

## 2021-12-19 DIAGNOSIS — M6281 Muscle weakness (generalized): Secondary | ICD-10-CM | POA: Diagnosis not present

## 2021-12-19 DIAGNOSIS — R2689 Other abnormalities of gait and mobility: Secondary | ICD-10-CM | POA: Diagnosis not present

## 2021-12-19 DIAGNOSIS — M79662 Pain in left lower leg: Secondary | ICD-10-CM | POA: Diagnosis not present

## 2021-12-19 DIAGNOSIS — M79652 Pain in left thigh: Secondary | ICD-10-CM | POA: Diagnosis not present

## 2021-12-24 DIAGNOSIS — M6281 Muscle weakness (generalized): Secondary | ICD-10-CM | POA: Diagnosis not present

## 2021-12-24 DIAGNOSIS — M79662 Pain in left lower leg: Secondary | ICD-10-CM | POA: Diagnosis not present

## 2021-12-24 DIAGNOSIS — M79652 Pain in left thigh: Secondary | ICD-10-CM | POA: Diagnosis not present

## 2021-12-24 DIAGNOSIS — R2689 Other abnormalities of gait and mobility: Secondary | ICD-10-CM | POA: Diagnosis not present

## 2021-12-26 DIAGNOSIS — M6281 Muscle weakness (generalized): Secondary | ICD-10-CM | POA: Diagnosis not present

## 2021-12-26 DIAGNOSIS — R2689 Other abnormalities of gait and mobility: Secondary | ICD-10-CM | POA: Diagnosis not present

## 2021-12-26 DIAGNOSIS — M79662 Pain in left lower leg: Secondary | ICD-10-CM | POA: Diagnosis not present

## 2021-12-26 DIAGNOSIS — M79652 Pain in left thigh: Secondary | ICD-10-CM | POA: Diagnosis not present

## 2022-01-01 DIAGNOSIS — M79652 Pain in left thigh: Secondary | ICD-10-CM | POA: Diagnosis not present

## 2022-01-01 DIAGNOSIS — R2689 Other abnormalities of gait and mobility: Secondary | ICD-10-CM | POA: Diagnosis not present

## 2022-01-01 DIAGNOSIS — M79662 Pain in left lower leg: Secondary | ICD-10-CM | POA: Diagnosis not present

## 2022-01-01 DIAGNOSIS — M6281 Muscle weakness (generalized): Secondary | ICD-10-CM | POA: Diagnosis not present

## 2022-01-08 ENCOUNTER — Telehealth: Payer: Self-pay | Admitting: Physical Medicine and Rehabilitation

## 2022-01-08 NOTE — Telephone Encounter (Signed)
Pt called requesting a call to set an appt for back injection. Please call pt at [phone]. ?

## 2022-01-10 NOTE — Telephone Encounter (Signed)
Pt called and is having back pain. Wanting to know if she can repeat LT L5-S1 IL. It helped her about 75%.  Also she is having left hip pain still. She is wanting to get both injections ? ?[phone]  ?

## 2022-01-13 ENCOUNTER — Ambulatory Visit (INDEPENDENT_AMBULATORY_CARE_PROVIDER_SITE_OTHER): Payer: Medicare Other | Admitting: Physician Assistant

## 2022-01-13 ENCOUNTER — Encounter: Payer: Self-pay | Admitting: Physician Assistant

## 2022-01-13 ENCOUNTER — Other Ambulatory Visit: Payer: Self-pay

## 2022-01-13 DIAGNOSIS — M1711 Unilateral primary osteoarthritis, right knee: Secondary | ICD-10-CM | POA: Diagnosis not present

## 2022-01-13 DIAGNOSIS — M1712 Unilateral primary osteoarthritis, left knee: Secondary | ICD-10-CM

## 2022-01-13 MED ORDER — METHYLPREDNISOLONE ACETATE 40 MG/ML IJ SUSP
40.0000 mg | INTRAMUSCULAR | Status: AC | PRN
  Administered 2022-01-13: 40 mg via INTRA_ARTICULAR

## 2022-01-13 MED ORDER — LIDOCAINE HCL 1 % IJ SOLN
3.0000 mL | INTRAMUSCULAR | Status: AC | PRN
  Administered 2022-01-13: 3 mL

## 2022-01-13 NOTE — Progress Notes (Signed)
? ?  Procedure Note ? ?Patient: Tracey Armstrong             ?Date of Birth: [birth year]           ?MRN: [id number]             ?Visit Date: 01/13/2022 ?HPI [name] comes in today requesting injection of both knees.  Last injections were given 09/23/2021.  She states these helped until recently.  She has known osteoarthritis both knees.  She has had no fevers chills coughs or ongoing infections.  She is nondiabetic. ? ?Review of systems see HPI otherwise negative ? ?Bilateral knees: No abnormal warmth erythema or effusion.  Tello crepitus bilaterally with passive range of motion.  Slight valgus deformity both knees.  Tenderness medial lateral joint line the left knee and the medial joint line of the right knee. ? ?Procedures: ?Visit Diagnoses:  ?1. Primary osteoarthritis of left knee   ?2. Primary osteoarthritis of right knee   ? ? ?Large Joint Inj: bilateral knee on 01/13/2022 5:07 PM ?Indications: pain ?Details: 22 G 1.5 in needle, anterolateral approach ? ?Arthrogram: No ? ?Medications (Right): 3 mL lidocaine 1 %; 40 mg methylPREDNISolone acetate 40 MG/ML ?Medications (Left): 3 mL lidocaine 1 %; 40 mg methylPREDNISolone acetate 40 MG/ML ?Outcome: tolerated well, no immediate complications ?Procedure, treatment alternatives, risks and benefits explained, specific risks discussed. Consent was given by the patient. Immediately prior to procedure a time out was called to verify the correct patient, procedure, equipment, support staff and site/side marked as required. Patient was prepped and draped in the usual sterile fashion.  ? ? ?Plan: Discussed with her quad strengthening.  She understands to wait 3 months between injections.  Questions were encouraged and answered at length ? ? ?

## 2022-02-07 ENCOUNTER — Other Ambulatory Visit: Payer: Self-pay | Admitting: Family Medicine

## 2022-02-07 ENCOUNTER — Other Ambulatory Visit: Payer: Self-pay

## 2022-02-07 DIAGNOSIS — I10 Essential (primary) hypertension: Secondary | ICD-10-CM

## 2022-02-07 DIAGNOSIS — F411 Generalized anxiety disorder: Secondary | ICD-10-CM

## 2022-02-07 MED ORDER — ALPRAZOLAM 1 MG PO TABS: ORAL_TABLET | ORAL | 2 refills | Status: DC

## 2022-02-10 ENCOUNTER — Ambulatory Visit (INDEPENDENT_AMBULATORY_CARE_PROVIDER_SITE_OTHER): Payer: Medicare Other | Admitting: Physical Medicine and Rehabilitation

## 2022-02-10 ENCOUNTER — Encounter: Payer: Self-pay | Admitting: Physical Medicine and Rehabilitation

## 2022-02-10 ENCOUNTER — Ambulatory Visit: Payer: Medicare Other

## 2022-02-10 VITALS — BP 128/81 | HR 80

## 2022-02-10 DIAGNOSIS — M5416 Radiculopathy, lumbar region: Secondary | ICD-10-CM

## 2022-02-10 MED ORDER — METHYLPREDNISOLONE ACETATE 80 MG/ML IJ SUSP
80.0000 mg | Freq: Once | INTRAMUSCULAR | Status: AC
  Administered 2022-02-10: 80 mg

## 2022-02-10 NOTE — Progress Notes (Signed)
Pt state lower back pain that travels to her left hip and knee. Pt state her left hip hurts the worse. Pt state walking, standing and sitting makes the pain worse. Pt state she takes pain meds to ease her pain ? ?Numeric Pain Rating Scale and Functional Assessment ?Average Pain 7 ? ? ?In the last MONTH (on 0-10 scale) has pain interfered with the following? ? ?1. General activity like being  able to carry out your everyday physical activities such as walking, climbing stairs, carrying groceries, or moving a chair?  ?Rating(10) ? ? ?+Driver, -BT, -Dye Allergies. ? ?

## 2022-02-10 NOTE — Patient Instructions (Signed)

## 2022-02-12 ENCOUNTER — Other Ambulatory Visit: Payer: Self-pay | Admitting: Family Medicine

## 2022-02-12 NOTE — Telephone Encounter (Signed)
Pls check, the Rx I sent when I saw her 2 months ago was for once a day. Did her other doctors increase to BID? Thanks ?

## 2022-02-24 ENCOUNTER — Ambulatory Visit: Payer: Medicare Other | Admitting: Family Medicine

## 2022-02-25 NOTE — Procedures (Signed)
Lumbar Epidural Steroid Injection - Interlaminar Approach with Fluoroscopic Guidance ? ?Patient: Tracey Armstrong      ?Date of Birth: 15-Mar-1935 ?MRN: [id number] ?PCP: Martinique, [name], MD      ?Visit Date: 02/10/2022 ?  ?Universal Protocol:    ? ?Consent Given By: the patient ? ?Position: PRONE ? ?Additional Comments: ?Vital signs were monitored before and after the procedure. ?Patient was prepped and draped in the usual sterile fashion. ?The correct patient, procedure, and site was verified. ? ? ?Injection Procedure Details:  ? ?Procedure diagnoses: Lumbar radiculopathy [M54.16]  ? ?Meds Administered:  ?Meds ordered this encounter  ?Medications  ? methylPREDNISolone acetate (DEPO-MEDROL) injection 80 mg  ?  ? ?Laterality: Left ? ?Location/Site:  L5-S1 ? ?Needle: 3.5 in., 20 ga. Tuohy ? ?Needle Placement: Paramedian epidural ? ?Findings:  ? -Comments: Excellent flow of contrast into the epidural space. ? ?Procedure Details: ?Using a paramedian approach from the side mentioned above, the region overlying the inferior lamina was localized under fluoroscopic visualization and the soft tissues overlying this structure were infiltrated with 4 ml. of 1% Lidocaine without Epinephrine. The Tuohy needle was inserted into the epidural space using a paramedian approach.  ? ?The epidural space was localized using loss of resistance along with counter oblique bi-planar fluoroscopic views.  After negative aspirate for air, blood, and CSF, a 2 ml. volume of Isovue-250 was injected into the epidural space and the flow of contrast was observed. Radiographs were obtained for documentation purposes.   ? ?The injectate was administered into the level noted above. ? ? ?Additional Comments:  ?The patient tolerated the procedure well ?Dressing: 2 x 2 sterile gauze and Band-Aid ?  ? ?Post-procedure details: ?Patient was observed during the procedure. ?Post-procedure instructions were reviewed. ? ?Patient left the clinic in stable condition.  ?

## 2022-02-25 NOTE — Progress Notes (Signed)
? ?Tracey Armstrong - 87 y.o. female MRN [id number]  Date of birth: 03/16/1935 ? ?Office Visit Note: ?Visit Date: 02/10/2022 ?PCP: Martinique, [name], MD ?Referred by: Martinique, [name], MD ? ?Subjective: ?Chief Complaint  ?Patient presents with  ? Lower Back - Pain  ? Left Hip - Pain  ? Left Knee - Pain  ? ?HPI:  Tracey Armstrong is a 87 y.o. female who comes in today for planned repeat Left L5-S1  Lumbar Interlaminar epidural steroid injection with fluoroscopic guidance.  The patient has failed conservative care including home exercise, medications, time and activity modification.  This injection will be diagnostic and hopefully therapeutic.  Please see requesting physician notes for further details and justification. Patient received more than 50% pain relief from prior injection.  ? ?Referring: Dr. Jean Rosenthal  ? ?ROS Otherwise per HPI. ? ?Assessment & Plan: ?Visit Diagnoses:  ?  ICD-10-CM   ?1. Lumbar radiculopathy  M54.16 XR C-ARM NO REPORT  ?  Epidural Steroid injection  ?  methylPREDNISolone acetate (DEPO-MEDROL) injection 80 mg  ?  ?  ?Plan: No additional findings.  ? ?Meds & Orders:  ?Meds ordered this encounter  ?Medications  ? methylPREDNISolone acetate (DEPO-MEDROL) injection 80 mg  ?  ?Orders Placed This Encounter  ?Procedures  ? XR C-ARM NO REPORT  ? Epidural Steroid injection  ?  ?Follow-up: Return if symptoms worsen or fail to improve.  ? ?Procedures: ?No procedures performed  ?Lumbar Epidural Steroid Injection - Interlaminar Approach with Fluoroscopic Guidance ? ?Patient: Tracey Armstrong      ?Date of Birth: February 04, 1935 ?MRN: [id number] ?PCP: Martinique, [name], MD      ?Visit Date: 02/10/2022 ?  ?Universal Protocol:    ? ?Consent Given By: the patient ? ?Position: PRONE ? ?Additional Comments: ?Vital signs were monitored before and after the procedure. ?Patient was prepped and draped in the usual sterile fashion. ?The correct patient, procedure, and site was verified. ? ? ?Injection Procedure Details:   ? ?Procedure diagnoses: Lumbar radiculopathy [M54.16]  ? ?Meds Administered:  ?Meds ordered this encounter  ?Medications  ? methylPREDNISolone acetate (DEPO-MEDROL) injection 80 mg  ?  ? ?Laterality: Left ? ?Location/Site:  L5-S1 ? ?Needle: 3.5 in., 20 ga. Tuohy ? ?Needle Placement: Paramedian epidural ? ?Findings:  ? -Comments: Excellent flow of contrast into the epidural space. ? ?Procedure Details: ?Using a paramedian approach from the side mentioned above, the region overlying the inferior lamina was localized under fluoroscopic visualization and the soft tissues overlying this structure were infiltrated with 4 ml. of 1% Lidocaine without Epinephrine. The Tuohy needle was inserted into the epidural space using a paramedian approach.  ? ?The epidural space was localized using loss of resistance along with counter oblique bi-planar fluoroscopic views.  After negative aspirate for air, blood, and CSF, a 2 ml. volume of Isovue-250 was injected into the epidural space and the flow of contrast was observed. Radiographs were obtained for documentation purposes.   ? ?The injectate was administered into the level noted above. ? ? ?Additional Comments:  ?The patient tolerated the procedure well ?Dressing: 2 x 2 sterile gauze and Band-Aid ?  ? ?Post-procedure details: ?Patient was observed during the procedure. ?Post-procedure instructions were reviewed. ? ?Patient left the clinic in stable condition.   ? ?Clinical History: ?No specialty comments available.  ? ? ? ?Objective:  VS:  HT:    WT:   BMI:     BP:128/81  HR:80bpm  TEMP: ( )  RESP:  ?Physical Exam ?Vitals  and nursing note reviewed.  ?Constitutional:   ?   General: She is not in acute distress. ?   Appearance: Normal appearance. She is not ill-appearing.  ?HENT:  ?   Head: Normocephalic and atraumatic.  ?   Right Ear: External ear normal.  ?   Left Ear: External ear normal.  ?Eyes:  ?   Extraocular Movements: Extraocular movements intact.  ?Cardiovascular:   ?   Rate and Rhythm: Normal rate.  ?   Pulses: Normal pulses.  ?Pulmonary:  ?   Effort: Pulmonary effort is normal. No respiratory distress.  ?Abdominal:  ?   General: There is no distension.  ?   Palpations: Abdomen is soft.  ?Musculoskeletal:     ?   General: Tenderness present.  ?   Cervical back: Neck supple.  ?   Right lower leg: No edema.  ?   Left lower leg: No edema.  ?   Comments: Patient has good distal strength with no pain over the greater trochanters.  No clonus or focal weakness.  ?Skin: ?   Findings: No erythema, lesion or rash.  ?Neurological:  ?   General: No focal deficit present.  ?   Mental Status: She is alert and oriented to person, place, and time.  ?   Sensory: No sensory deficit.  ?   Motor: No weakness or abnormal muscle tone.  ?   Coordination: Coordination normal.  ?Psychiatric:     ?   Mood and Affect: Mood normal.     ?   Behavior: Behavior normal.  ?  ? ?Imaging: ?No results found. ?

## 2022-02-25 NOTE — Progress Notes (Signed)
? ?HPI: ?Tracey Armstrong is a 87 y.o. female, who is here today for follow up. ?She was last seen on 10/28/21. ?Since her last visit she has seen ortho and neuro. ?States that she received a lumbar epidural injection,02/10/22, but she did not feel like it helped. ?She is using IcyHot patch, which is expensive, she wonders if she can have a prescription for a prescription patch. ?She is no longer on Zanaflex, it increased BP. ? ?Hypertension:  ?Medications:Verapamil 180 mg daily. ?BP readings at home:120's/70's ?Side effects:none ?Negative for unusual or severe headache, visual changes, exertional chest pain, dyspnea, focal weakness, or edema. ? ?Lab Results  ?Component Value Date  ? CREATININE 0.94 10/28/2021  ? BUN 16 10/28/2021  ? NA 136 10/28/2021  ? K 4.2 10/28/2021  ? CL 100 10/28/2021  ? CO2 29 10/28/2021  ? ?Anxiety and insomnia: She is currently on alprazolam 1 mg, 1/2 to 1 tablet twice daily as needed, no more than 45 tablets/month. ?She has been on this medication for many years, tolerating well. ?She still feels medication helps with anxiety and with sleep. ? ?Today she is showing me 2 pants liners with red blood on. States that for the past 2 days she has had blood on tissue and underwear, mainly after voiding. She thinks it is from a "bump" she has had close to vagina for a while. ?"Little" burning sensation with voiding, intermittently. ?Negative for increasing urinary frequency or pelvic pain. ?History of urine incontinence, she is currently on oxybutynin XL 10 mg daily. ?She is also wearing depends. ? ?Hyperlipidemia: She tried pravastatin 20 mg, it caused severe myalgia, she had difficulty getting up from bed and walking. She discontinued and pain is back to its baseline. ?Aortic atherosclerosis has been seen on imaging in the past, abdominal CT in 03/2021. ? ?Lab Results  ?Component Value Date  ? CHOL 207 (H) 04/12/2021  ? HDL 70 04/12/2021  ? [place] 121 (H) 04/12/2021  ? TRIG 80 04/12/2021  ?  CHOLHDL 3.0 04/12/2021  ? ?Review of Systems  ?Constitutional:  Positive for fatigue. Negative for activity change, appetite change and fever.  ?HENT:  Negative for mouth sores, nosebleeds and sore throat.   ?Respiratory:  Negative for cough and wheezing.   ?Gastrointestinal:  Negative for abdominal pain, blood in stool, nausea and vomiting.  ?     Negative for changes in bowel habits.  ?Musculoskeletal:  Positive for arthralgias, back pain and gait problem.  ?Neurological:  Negative for syncope and facial asymmetry.  ?Psychiatric/Behavioral:  Negative for confusion. The patient is nervous/anxious.   ?Rest see pertinent positives and negatives per HPI. ? ?Current Outpatient Medications on File Prior to Visit  ?Medication Sig Dispense Refill  ? ALPRAZolam (XANAX) 1 MG tablet TAKE 1/2 TABLET BY MOUTH AT NOON AND 1 TABLET BY MOUTH EVERY NIGHT AT BEDTIME 45 tablet 2  ? antiseptic oral rinse (BIOTENE) LIQD Use 3 mls 3-4 times per day for dry mouth. Do not swallow. 237 mL 0  ? Cholecalciferol 1.25 MG (50000 UT) capsule Take 1 capsule (50,000 Units total) by mouth every 14 (fourteen) days. 7 capsule 1  ? Cyanocobalamin (VITAMIN B-12 PO) Take 1 tablet by mouth daily with lunch.    ? Dextran 70-Hypromellose (ARTIFICIAL TEARS PF OP) Place 1-2 drops into both eyes See admin instructions. Instill 1-2 drops into both eyes at bedtime and an additional 2-3 times a day as needed for dryness    ? diphenhydramine-acetaminophen (TYLENOL PM) 25-500 MG TABS tablet  Take 1.5 tablets by mouth at bedtime.    ? docusate sodium (COLACE) 100 MG capsule Take 1 capsule (100 mg total) by mouth 2 (two) times daily. 10 capsule 0  ? gabapentin (NEURONTIN) 300 MG capsule Take 1 capsule every afternoon 90 capsule 3  ? levETIRAcetam (KEPPRA) 500 MG tablet Take 1 tablet (500 mg total) by mouth 2 (two) times daily. 180 tablet 3  ? Magnesium 400 MG TABS Take 400 mg by mouth at bedtime.    ? MAXALT-MLT 10 MG disintegrating tablet DISSOLVE ONE TABLET BY  MOUTH AS NEEDED HEADACHE. 9 tablet 11  ? Melatonin 10 MG TABS Take 10 mg by mouth at bedtime as needed (for sleep).    ? omeprazole (PRILOSEC OTC) 20 MG tablet Take 20 mg by mouth daily before breakfast.    ? oxybutynin (DITROPAN-XL) 10 MG 24 hr tablet TAKE 1 TABLET(10 MG) BY MOUTH AT BEDTIME 90 tablet 2  ? POTASSIUM PO Take 1 tablet by mouth daily with lunch.    ? pyridOXINE (VITAMIN B-6) 100 MG tablet Take 400 mg by mouth daily with lunch.    ? riboflavin (VITAMIN B-2) 100 MG TABS tablet Take 400 mg by mouth daily with lunch.    ? tretinoin (RETIN-A) 0.1 % cream APPLY TO AFFECTED AREA EVERY DAY AT BEDTIME (Patient taking differently: Apply 1 application. topically at bedtime.) 45 g 2  ? verapamil (VERELAN PM) 180 MG 24 hr capsule TAKE 1 CAPSULE(180 MG) BY MOUTH DAILY 90 capsule 3  ? ?No current facility-administered medications on file prior to visit.  ? ?Past Medical History:  ?Diagnosis Date  ? Anxiety   ? Arthritis   ? Chronic insomnia 03/30/2015  ? Chronic low back pain 12/02/2016  ? Depression   ? GERD (gastroesophageal reflux disease)   ? Hiatal hernia   ? High cholesterol   ? Hypertelorism   ? Hypertension   ? Meningioma ([place])   ? Migraine   ? ?Allergies  ?Allergen Reactions  ? Dihydrotachysterol Other (See Comments)  ?  Dihydrotachysterol is a form of vitamin D. Might be "DHT Intensol" (Name Brand)- Reaction unknown- tolerating 50,000 units of Vitamin D-2 (Drisdol) in 2022  ? Trazodone And Nefazodone Other (See Comments)  ?  Worsens headaches  ? Valium [Diazepam] Other (See Comments)  ?  headaches  ? Citalopram Other (See Comments)  ?  Other reaction(s): Headache  ? Other Other (See Comments)  ?  "States all antidepressants cause migraines." - tolerates Xanax   ? ? ?Social History  ? ?Socioeconomic History  ? Marital status: Widowed  ?  Spouse name: Not on file  ? Number of children: 4  ? Years of education: 87  ? Highest education level: Not on file  ?Occupational History  ? Occupation: Retired  ?Tobacco  Use  ? Smoking status: Never  ? Smokeless tobacco: Never  ?Vaping Use  ? Vaping Use: Never used  ?Substance and Sexual Activity  ? Alcohol use: No  ? Drug use: No  ? Sexual activity: Not Currently  ?Other Topics Concern  ? Not on file  ?Social History Narrative  ? Lives in [place] [facility]  ?   ? Patient drinks 1 glass of tea daily.  ?   ? Patient is right handed.  ? ?Social Determinants of Health  ? ?Financial Resource Strain: Low Risk   ? Difficulty of Paying Living Expenses: Not hard at all  ?Food Insecurity: No Food Insecurity  ? Worried About Estate manager/land agent  of Food in the Last Year: Never true  ? Ran Out of Food in the Last Year: Never true  ?Transportation Needs: No Transportation Needs  ? Lack of Transportation (Medical): No  ? Lack of Transportation (Non-Medical): No  ?Physical Activity: Insufficiently Active  ? Days of Exercise per Week: 7 days  ? Minutes of Exercise per Session: 20 min  ?Stress: No Stress Concern Present  ? Feeling of Stress : Not at all  ?Social Connections: Moderately Integrated  ? Frequency of Communication with Friends and Family: More than three times a week  ? Frequency of Social Gatherings with Friends and Family: More than three times a week  ? Attends Religious Services: 1 to 4 times per year  ? Active Member of Clubs or Organizations: Yes  ? Attends Archivist Meetings: 1 to 4 times per year  ? Marital Status: Widowed  ? ?Vitals:  ? 02/26/22 1207  ?BP: 120/70  ?Pulse: 80  ?Resp: 16  ?Temp: 98.2 ?F (36.8 ?C)  ?SpO2: 97%  ? ?Wt Readings from Last 3 Encounters:  ?02/26/22 151 lb 8 oz (68.7 kg)  ?11/25/21 146 lb (66.2 kg)  ?11/18/21 142 lb (64.4 kg)  ? ?Body mass index is 27.71 kg/m?. ? ?Physical Exam ?Vitals and nursing note reviewed.  ?Constitutional:   ?   General: She is not in acute distress. ?   Appearance: She is well-developed.  ?HENT:  ?   Head: Normocephalic and atraumatic.  ?   Mouth/Throat:  ?   Mouth: Mucous membranes are moist.  ?   Pharynx: Oropharynx is  clear.  ?Eyes:  ?   Conjunctiva/sclera: Conjunctivae normal.  ?Cardiovascular:  ?   Rate and Rhythm: Normal rate and regular rhythm.  ?   Pulses:     ?     Dorsalis pedis pulses are 2+ on the right side and 2+ on th

## 2022-02-26 ENCOUNTER — Ambulatory Visit (INDEPENDENT_AMBULATORY_CARE_PROVIDER_SITE_OTHER): Payer: Medicare Other | Admitting: Family Medicine

## 2022-02-26 ENCOUNTER — Encounter: Payer: Self-pay | Admitting: Family Medicine

## 2022-02-26 VITALS — BP 120/70 | HR 80 | Temp 98.2°F | Resp 16 | Ht 62.0 in | Wt 151.5 lb

## 2022-02-26 DIAGNOSIS — G8929 Other chronic pain: Secondary | ICD-10-CM | POA: Diagnosis not present

## 2022-02-26 DIAGNOSIS — M545 Low back pain, unspecified: Secondary | ICD-10-CM | POA: Diagnosis not present

## 2022-02-26 DIAGNOSIS — D329 Benign neoplasm of meninges, unspecified: Secondary | ICD-10-CM | POA: Diagnosis not present

## 2022-02-26 DIAGNOSIS — L02215 Cutaneous abscess of perineum: Secondary | ICD-10-CM

## 2022-02-26 DIAGNOSIS — I700 Atherosclerosis of aorta: Secondary | ICD-10-CM

## 2022-02-26 DIAGNOSIS — I10 Essential (primary) hypertension: Secondary | ICD-10-CM | POA: Diagnosis not present

## 2022-02-26 DIAGNOSIS — T466X5A Adverse effect of antihyperlipidemic and antiarteriosclerotic drugs, initial encounter: Secondary | ICD-10-CM

## 2022-02-26 DIAGNOSIS — M791 Myalgia, unspecified site: Secondary | ICD-10-CM | POA: Diagnosis not present

## 2022-02-26 DIAGNOSIS — R319 Hematuria, unspecified: Secondary | ICD-10-CM | POA: Diagnosis not present

## 2022-02-26 DIAGNOSIS — E782 Mixed hyperlipidemia: Secondary | ICD-10-CM | POA: Diagnosis not present

## 2022-02-26 DIAGNOSIS — F411 Generalized anxiety disorder: Secondary | ICD-10-CM

## 2022-02-26 MED ORDER — DICLOFENAC EPOLAMINE 1.3 % EX PTCH: 1.0000 | MEDICATED_PATCH | Freq: Two times a day (BID) | CUTANEOUS | 1 refills | Status: AC

## 2022-02-26 MED ORDER — SULFAMETHOXAZOLE-TRIMETHOPRIM 800-160 MG PO TABS: 1.0000 | ORAL_TABLET | Freq: Two times a day (BID) | ORAL | 0 refills | Status: AC

## 2022-02-26 NOTE — Assessment & Plan Note (Signed)
Statin medication, pravastatin, exacerbated myalgias and arthralgias. ?Continue nonpharmacologic treatment. ?

## 2022-02-26 NOTE — Patient Instructions (Addendum)
A few things to remember from today's visit: ? ? ?Essential hypertension ? ?Generalized anxiety disorder ? ?Atherosclerosis of aorta ([place]) ? ?Myalgia due to statin ? ?Chronic low back pain, unspecified back pain laterality, unspecified whether sciatica present - Plan: diclofenac (FLECTOR) 1.3 % PTCH ? ?Perineal abscess - Plan: sulfamethoxazole-trimethoprim (BACTRIM DS) 800-160 MG tablet ? ?Hematuria, unspecified type - Plan: Urinalysis with Culture Reflex ? ?If you need refills please call your pharmacy. ?Do not use My Chart to request refills or for acute issues that need immediate attention. ?  ?Sitz bath daily with warm water and Epson salt may help with skin lesion. If not resolved, may need a biopsy. ?Flector patch for back pain. ?No other change today. ?Please be sure medication list is accurate. ?If a new problem present, please set up appointment sooner than planned today. ? ? ? ? ? ? ? ?

## 2022-02-26 NOTE — Assessment & Plan Note (Signed)
Problem is stable. ?Continue alprazolam 1 mg 1/2 to 1 tablet twice daily as needed, 45 tablets/month max. ?She understands side effects of medication, she has been taking it for many years. ?Follow-up in 5 months, before if needed. ?

## 2022-02-26 NOTE — Assessment & Plan Note (Signed)
LDL 121 in 03/2021. ?We discussed CV benefits of statins but did not tolerate pravastatin well. ? ?

## 2022-02-26 NOTE — Assessment & Plan Note (Addendum)
Diclofenac patch (Flector) 1.3% to use twice daily as needed sent to her pharmacy, explained that I am not sure about insurance coverage. ?On gabapentin 300 mg daily, which has helped with lower extremity pain. ?Fall precautions to continue. ?Continue following with orthopedics. ?

## 2022-02-26 NOTE — Assessment & Plan Note (Signed)
She has discontinued pravastatin due to severe myalgias that interfere with ADL's. ? ?

## 2022-03-01 LAB — URINALYSIS W MICROSCOPIC + REFLEX CULTURE
Bilirubin Urine: NEGATIVE
Glucose, UA: NEGATIVE
Hgb urine dipstick: NEGATIVE
Hyaline Cast: NONE SEEN /LPF
Ketones, ur: NEGATIVE
Nitrites, Initial: POSITIVE — AB
Protein, ur: NEGATIVE
RBC / HPF: NONE SEEN /HPF (ref 0–2)
Specific Gravity, Urine: 1.024 (ref 1.001–1.035)
pH: 5.5 (ref 5.0–8.0)

## 2022-03-01 LAB — URINE CULTURE
MICRO NUMBER:: 13379941
SPECIMEN QUALITY:: ADEQUATE

## 2022-03-01 LAB — CULTURE INDICATED

## 2022-03-02 MED ORDER — AMOXICILLIN-POT CLAVULANATE 875-125 MG PO TABS: 1.0000 | ORAL_TABLET | Freq: Two times a day (BID) | ORAL | 0 refills | Status: AC

## 2022-03-04 ENCOUNTER — Telehealth: Payer: Self-pay

## 2022-03-04 NOTE — Telephone Encounter (Signed)
Patty, RN called in to see if Ms. Huberty Rx had been sent in. I advised it was sent in & that insurance denied the request for the pain patch. Patty verbalized understanding.  ?

## 2022-04-14 ENCOUNTER — Ambulatory Visit (INDEPENDENT_AMBULATORY_CARE_PROVIDER_SITE_OTHER): Payer: Medicare Other | Admitting: Orthopaedic Surgery

## 2022-04-14 ENCOUNTER — Telehealth: Payer: Self-pay

## 2022-04-14 ENCOUNTER — Ambulatory Visit (INDEPENDENT_AMBULATORY_CARE_PROVIDER_SITE_OTHER): Payer: Medicare Other

## 2022-04-14 ENCOUNTER — Encounter: Payer: Self-pay | Admitting: Orthopaedic Surgery

## 2022-04-14 DIAGNOSIS — S72355K Nondisplaced comminuted fracture of shaft of left femur, subsequent encounter for closed fracture with nonunion: Secondary | ICD-10-CM | POA: Diagnosis not present

## 2022-04-14 DIAGNOSIS — M25562 Pain in left knee: Secondary | ICD-10-CM | POA: Diagnosis not present

## 2022-04-14 DIAGNOSIS — M1712 Unilateral primary osteoarthritis, left knee: Secondary | ICD-10-CM | POA: Diagnosis not present

## 2022-04-14 DIAGNOSIS — G8929 Other chronic pain: Secondary | ICD-10-CM | POA: Diagnosis not present

## 2022-04-14 DIAGNOSIS — M7062 Trochanteric bursitis, left hip: Secondary | ICD-10-CM

## 2022-04-14 MED ORDER — ACETAMINOPHEN-CODEINE 300-30 MG PO TABS: 1.0000 | ORAL_TABLET | Freq: Three times a day (TID) | ORAL | 0 refills | Status: DC | PRN

## 2022-04-14 NOTE — Telephone Encounter (Signed)
Patient wanted me to ask you about more gel injections in Bil knees  I don't see where she has ever had anything but cortisone injections.

## 2022-04-14 NOTE — Progress Notes (Signed)
The patient comes in today with a complicated history.  She is 87 years old and we have seen her in the past for arthritis in her left knee.  In 2021 she sustained a distal third shaft femur fracture in one of our colleagues in town appropriately placed a retrograde intramedullary nail into the femur.  She also sees Dr. Alvester Morin for injections in her lumbar spine.  She comes in today left hip pain and left knee pain.  She occasionally gets left thigh pain as well.  She ambulates slowly with a rolling walker.  She also has a history of seizure disorder.  She last had a lumbar epidural steroid injection by Dr. Alvester Morin in April of this year and that is helped some.  She does not take any chronic pain medicine.  She does take gabapentin.  Examination of her left hip and left knee show that the both move smoothly and fluidly.  There is no pain in the left hip in terms of the groin but there is pain to palpation over the left hip trochanteric area.  The left knee has slight valgus malalignment.  She has medial and lateral joint line tenderness and patellofemoral crepitation.  There is only mild to minimal left thigh pain.  X-rays of the left femur today showed no arthritic changes in the left hip.  There is tricompartment arthritis with valgus malalignment of the left knee.  There is also presence of an intramedullary nail with his retrograde nail traversing a fracture of the distal third of the femur.  There is abundant healing callus bone formation.  The bone is very osteopenic.  A CT scan from October this past year was reviewed and showed an incomplete union of the fracture itself.  I talked her about the possibility of this being a stable nonunion but the only way to get this to completely heal would be removing the rod and really reaming and placing a bigger rod and potentially bone grafting.  She would rather not go through this.  I did recommend a steroid injection of her right hip trochanteric area which she  agreed to and tolerated well.  Obviously the intramedullary nail is stable and there is more healing than what previous x-rays have shown so she may have filled this enough to support her.  The only other treatment for her knee would be considering hyaluronic acid which is something she would like to try for her left knee because she has not had this before to treat the arthritis pain of her left knee.  She is only had steroid injections and those are not helping much anymore.  I did place a steroid injection around her left hip trochanteric area today which she tolerated well.  We will order hyaluronic acid for the left knee.  We will hold off addressing the left femur and less it becomes more problematic.  I will also send in some Tylenol 3 for pain.  She agrees with this treatment plan and again is not interested in any type of surgical intervention.

## 2022-04-28 ENCOUNTER — Other Ambulatory Visit: Payer: Self-pay | Admitting: Family Medicine

## 2022-05-06 ENCOUNTER — Telehealth: Payer: Self-pay | Admitting: Neurology

## 2022-05-06 ENCOUNTER — Telehealth: Payer: Self-pay | Admitting: Physical Medicine and Rehabilitation

## 2022-05-06 ENCOUNTER — Other Ambulatory Visit: Payer: Self-pay | Admitting: Neurology

## 2022-05-06 NOTE — Telephone Encounter (Signed)
[name] is calling to schedule for Tracey Armstrong with Dr. Ernestina Patches for a back injection. The days that work best for them are early afternoons on Mon, Wed or Fridays. Please advise

## 2022-05-06 NOTE — Telephone Encounter (Signed)
Patient has noticed some changes per caregiver, Marjorie Smolder.  Her back of her head has been bothering her and feels bumpy and her ear feels weird, both on the left.  She'd like to move her appointment up to sooner or get Dr. Amparo Bristol recommendations.

## 2022-05-06 NOTE — Telephone Encounter (Signed)
Recommend she see her PCP for these first, these symptoms are not clearly related to her brain, need to have her ears checked first. thanks

## 2022-05-06 NOTE — Telephone Encounter (Signed)
Left message on machine for patient to call back.

## 2022-05-06 NOTE — Telephone Encounter (Signed)
Called Pt and gave answer , she was still concerned due to she was told she had a small termor 5 years ago. She had a MRI in 2022, Dr Delice Lesch said it is not Neuro , that she needs to call and get ear appointment.

## 2022-05-06 NOTE — Telephone Encounter (Signed)
Patient is returning a call to someone. °

## 2022-05-07 ENCOUNTER — Other Ambulatory Visit: Payer: Self-pay | Admitting: Physical Medicine and Rehabilitation

## 2022-05-07 DIAGNOSIS — M5416 Radiculopathy, lumbar region: Secondary | ICD-10-CM

## 2022-05-07 DIAGNOSIS — M48062 Spinal stenosis, lumbar region with neurogenic claudication: Secondary | ICD-10-CM

## 2022-05-07 NOTE — Telephone Encounter (Signed)
Called Pt and made her aware of the answer per Dr. Delice Lesch, she under stood.

## 2022-05-08 ENCOUNTER — Other Ambulatory Visit: Payer: Self-pay | Admitting: Family Medicine

## 2022-05-08 DIAGNOSIS — F411 Generalized anxiety disorder: Secondary | ICD-10-CM

## 2022-05-09 NOTE — Telephone Encounter (Signed)
-   last filled 04/15/22 - next ov 07/30/22

## 2022-05-13 NOTE — Telephone Encounter (Signed)
Submited for VOB on [email or web address] 

## 2022-05-13 NOTE — Progress Notes (Unsigned)
ACUTE VISIT No chief complaint on file.  HPI: [name] is a 87 y.o. female, who is here today complaining of *** HPI  Review of Systems Rest see pertinent positives and negatives per HPI.  Current Outpatient Medications on File Prior to Visit  Medication Sig Dispense Refill  . acetaminophen-codeine (TYLENOL #3) 300-30 MG tablet Take 1-2 tablets by mouth every 8 (eight) hours as needed for moderate pain. 30 tablet 0  . ALPRAZolam (XANAX) 1 MG tablet TAKE ONE-HALF TABLET BY MOUTH AT NOON AND 1 EVERY NIGHT AT BEDTIME. 45 tablet 2  . antiseptic oral rinse (BIOTENE) LIQD Use 3 mls 3-4 times per day for dry mouth. Do not swallow. 237 mL 0  . Cholecalciferol 1.25 MG (50000 UT) capsule Take 1 capsule (50,000 Units total) by mouth every 14 (fourteen) days. 7 capsule 1  . Cyanocobalamin (VITAMIN B-12 PO) Take 1 tablet by mouth daily with lunch.    . Dextran 70-Hypromellose (ARTIFICIAL TEARS PF OP) Place 1-2 drops into both eyes See admin instructions. Instill 1-2 drops into both eyes at bedtime and an additional 2-3 times a day as needed for dryness    . diclofenac (FLECTOR) 1.3 % PTCH Place 1 patch onto the skin 2 (two) times daily. 60 patch 1  . diphenhydramine-acetaminophen (TYLENOL PM) 25-500 MG TABS tablet Take 1.5 tablets by mouth at bedtime.    . docusate sodium (COLACE) 100 MG capsule Take 1 capsule (100 mg total) by mouth 2 (two) times daily. 10 capsule 0  . gabapentin (NEURONTIN) 300 MG capsule Take 1 capsule every afternoon 90 capsule 3  . levETIRAcetam (KEPPRA) 500 MG tablet TAKE 1 TABLET(500 MG) BY MOUTH TWICE DAILY 180 tablet 0  . Magnesium 400 MG TABS Take 400 mg by mouth at bedtime.    Marland Kitchen MAXALT-MLT 10 MG disintegrating tablet DISSOLVE ONE TABLET BY MOUTH AS NEEDED HEADACHE. 9 tablet 11  . Melatonin 10 MG TABS Take 10 mg by mouth at bedtime as needed (for sleep).    Marland Kitchen omeprazole (PRILOSEC OTC) 20 MG tablet Take 20 mg by mouth daily before breakfast.    . oxybutynin  (DITROPAN-XL) 10 MG 24 hr tablet TAKE 1 TABLET(10 MG) BY MOUTH AT BEDTIME 90 tablet 2  . POTASSIUM PO Take 1 tablet by mouth daily with lunch.    . pyridOXINE (VITAMIN B-6) 100 MG tablet Take 400 mg by mouth daily with lunch.    . riboflavin (VITAMIN B-2) 100 MG TABS tablet Take 400 mg by mouth daily with lunch.    . tretinoin (RETIN-A) 0.1 % cream APPLY TO AFFECTED AREA EVERY DAY AT BEDTIME (Patient taking differently: Apply 1 application. topically at bedtime.) 45 g 2  . verapamil (VERELAN PM) 180 MG 24 hr capsule TAKE 1 CAPSULE(180 MG) BY MOUTH DAILY 90 capsule 3   No current facility-administered medications on file prior to visit.     Past Medical History:  Diagnosis Date  . Anxiety   . Arthritis   . Chronic insomnia 03/30/2015  . Chronic low back pain 12/02/2016  . Depression   . GERD (gastroesophageal reflux disease)   . Hiatal hernia   . High cholesterol   . Hypertelorism   . Hypertension   . Meningioma ([place])   . Migraine    Allergies  Allergen Reactions  . Dihydrotachysterol Other (See Comments)    Dihydrotachysterol is a form of vitamin D. Might be "DHT Intensol" (Name Brand)- Reaction unknown- tolerating 50,000 units of Vitamin D-2 (Drisdol) in  2022  . Trazodone And Nefazodone Other (See Comments)    Worsens headaches  . Valium [Diazepam] Other (See Comments)    headaches  . Citalopram Other (See Comments)    Other reaction(s): Headache  . Other Other (See Comments)    "States all antidepressants cause migraines." - tolerates Xanax     Social History   Socioeconomic History  . Marital status: Widowed    Spouse name: Not on file  . Number of children: 4  . Years of education: 46  . Highest education level: Not on file  Occupational History  . Occupation: Retired  Tobacco Use  . Smoking status: Never  . Smokeless tobacco: Never  Vaping Use  . Vaping Use: Never used  Substance and Sexual Activity  . Alcohol use: No  . Drug use: No  . Sexual activity:  Not Currently  Other Topics Concern  . Not on file  Social History Narrative   Lives in [facility] [place] [facility]      Patient drinks 1 glass of tea daily.      Patient is right handed.   Social Determinants of Health   Financial Resource Strain: Low Risk  (11/18/2021)   Overall Financial Resource Strain (CARDIA)   . Difficulty of Paying Living Expenses: Not hard at all  Food Insecurity: No Food Insecurity (11/18/2021)   Hunger Vital Sign   . Worried About Charity fundraiser in the Last Year: Never true   . Ran Out of Food in the Last Year: Never true  Transportation Needs: No Transportation Needs (11/18/2021)   PRAPARE - Transportation   . Lack of Transportation (Medical): No   . Lack of Transportation (Non-Medical): No  Physical Activity: Insufficiently Active (11/18/2021)   Exercise Vital Sign   . Days of Exercise per Week: 7 days   . Minutes of Exercise per Session: 20 min  Stress: No Stress Concern Present (11/18/2021)   [place]   . Feeling of Stress : Not at all  Social Connections: Moderately Integrated (11/18/2021)   Social Connection and Isolation Panel [NHANES]   . Frequency of Communication with Friends and Family: More than three times a week   . Frequency of Social Gatherings with Friends and Family: More than three times a week   . Attends Religious Services: 1 to 4 times per year   . Active Member of Clubs or Organizations: Yes   . Attends Archivist Meetings: 1 to 4 times per year   . Marital Status: Widowed    There were no vitals filed for this visit. There is no height or weight on file to calculate BMI.  Physical Exam  ASSESSMENT AND PLAN:  There are no diagnoses linked to this encounter.   No follow-ups on file.   Alleen Kehm G. Martinique, MD  [facility]. [place] office.  Discharge Instructions   None

## 2022-05-14 ENCOUNTER — Ambulatory Visit (INDEPENDENT_AMBULATORY_CARE_PROVIDER_SITE_OTHER): Payer: Medicare Other | Admitting: Family Medicine

## 2022-05-14 ENCOUNTER — Encounter: Payer: Self-pay | Admitting: Family Medicine

## 2022-05-14 VITALS — BP 120/80 | HR 84 | Resp 16 | Ht 62.0 in | Wt 158.2 lb

## 2022-05-14 DIAGNOSIS — J3489 Other specified disorders of nose and nasal sinuses: Secondary | ICD-10-CM

## 2022-05-14 DIAGNOSIS — M270 Developmental disorders of jaws: Secondary | ICD-10-CM

## 2022-05-14 DIAGNOSIS — H9312 Tinnitus, left ear: Secondary | ICD-10-CM | POA: Diagnosis not present

## 2022-05-14 DIAGNOSIS — R519 Headache, unspecified: Secondary | ICD-10-CM | POA: Diagnosis not present

## 2022-05-14 DIAGNOSIS — H6123 Impacted cerumen, bilateral: Secondary | ICD-10-CM

## 2022-05-14 DIAGNOSIS — F411 Generalized anxiety disorder: Secondary | ICD-10-CM | POA: Diagnosis not present

## 2022-05-14 NOTE — Assessment & Plan Note (Signed)
Reassured, explained that it is benign,I do not think it is causing any problem at this time, and treatment is not necessary.

## 2022-05-14 NOTE — Assessment & Plan Note (Addendum)
I prefer not to increase dose of Alprazolam due to the risk for side effects and med interaction.Continue Alprazolam, 1 mg tab up to 1.5 tan daily prn. She has not tolerated SSRI or SNRI's.

## 2022-05-14 NOTE — Patient Instructions (Addendum)
A few things to remember from today's visit:  Excessive cerumen in both ear canals  Scalp tenderness  Tinnitus, left ear  Rhinorrhea  On palpation of scalp I noted some bumpiness but no skin changes. We can continue monitoring for skin changes or growth. Runny nose could be allergies. Continue using nasal spray. Saline nasal irrigations may also help. No q tips in ears. Debrox over the counter may help.  If you need refills please call your pharmacy. Do not use My Chart to request refills or for acute issues that need immediate attention.    Please be sure medication list is accurate. If a new problem present, please set up appointment sooner than planned today.

## 2022-05-19 ENCOUNTER — Telehealth: Payer: Self-pay | Admitting: Physical Medicine and Rehabilitation

## 2022-05-19 ENCOUNTER — Encounter: Payer: Self-pay | Admitting: Physician Assistant

## 2022-05-19 ENCOUNTER — Ambulatory Visit (INDEPENDENT_AMBULATORY_CARE_PROVIDER_SITE_OTHER): Payer: Medicare Other | Admitting: Physician Assistant

## 2022-05-19 DIAGNOSIS — M1712 Unilateral primary osteoarthritis, left knee: Secondary | ICD-10-CM

## 2022-05-19 DIAGNOSIS — M1711 Unilateral primary osteoarthritis, right knee: Secondary | ICD-10-CM | POA: Diagnosis not present

## 2022-05-19 MED ORDER — METHYLPREDNISOLONE ACETATE 40 MG/ML IJ SUSP
40.0000 mg | INTRAMUSCULAR | Status: AC | PRN
  Administered 2022-05-19: 40 mg via INTRA_ARTICULAR

## 2022-05-19 MED ORDER — LIDOCAINE HCL 1 % IJ SOLN
3.0000 mL | INTRAMUSCULAR | Status: AC | PRN
  Administered 2022-05-19: 3 mL

## 2022-05-19 NOTE — Telephone Encounter (Signed)
Pt asked for a call back for a sooner appt with dr Ernestina Patches. Please call pt at [phone]

## 2022-05-19 NOTE — Progress Notes (Signed)
   Procedure Note  Patient: Tracey Armstrong             Date of Birth: 01-Apr-1935           MRN: [id number]             Visit Date: 05/19/2022 HPI: Tracey Armstrong comes in today for scheduled Monovisc injections both knees.  However she wants cortisone injections due to the fact that these give her more instant relief.  She states she is having severe pain in both knees.  Left knee pain is worse than right.  She had no recent falls.  She is ambulating with a rollator.  States that the last cortisone injections on 01/13/2022 gave her good relief until a month ago.  Review of systems see HPI  Physical exam: Bilateral knees good range of motion both knees.  No abnormal warmth erythema of either knee.  Patellofemoral crepitus with passive range of motion both knees. Procedures: Visit Diagnoses:  1. Unilateral primary osteoarthritis, left knee   2. Primary osteoarthritis of right knee     Large Joint Inj: bilateral knee on 05/19/2022 1:29 PM Indications: pain Details: 22 G 1.5 in needle, anterolateral approach  Arthrogram: No  Medications (Right): 3 mL lidocaine 1 %; 40 mg methylPREDNISolone acetate 40 MG/ML Medications (Left): 3 mL lidocaine 1 %; 40 mg methylPREDNISolone acetate 40 MG/ML Outcome: tolerated well, no immediate complications Procedure, treatment alternatives, risks and benefits explained, specific risks discussed. Consent was given by the patient. Immediately prior to procedure a time out was called to verify the correct patient, procedure, equipment, support staff and site/side marked as required. Patient was prepped and draped in the usual sterile fashion.    Plan: She will follow-up with [place] as needed.  She could still undergo the Monovisc injections if she chooses so in the next few weeks.  Otherwise she knows to wait at least 3 months between cortisone injections.  Questions were encouraged and answered

## 2022-06-02 ENCOUNTER — Ambulatory Visit: Payer: Self-pay

## 2022-06-02 ENCOUNTER — Ambulatory Visit (INDEPENDENT_AMBULATORY_CARE_PROVIDER_SITE_OTHER): Payer: Medicare Other | Admitting: Physical Medicine and Rehabilitation

## 2022-06-02 ENCOUNTER — Encounter: Payer: Self-pay | Admitting: Physical Medicine and Rehabilitation

## 2022-06-02 ENCOUNTER — Other Ambulatory Visit: Payer: Self-pay | Admitting: Family Medicine

## 2022-06-02 VITALS — BP 123/74 | HR 83

## 2022-06-02 DIAGNOSIS — M5416 Radiculopathy, lumbar region: Secondary | ICD-10-CM | POA: Diagnosis not present

## 2022-06-02 DIAGNOSIS — E559 Vitamin D deficiency, unspecified: Secondary | ICD-10-CM

## 2022-06-02 MED ORDER — METHYLPREDNISOLONE ACETATE 80 MG/ML IJ SUSP
80.0000 mg | Freq: Once | INTRAMUSCULAR | Status: AC
  Administered 2022-06-02: 80 mg

## 2022-06-02 NOTE — Progress Notes (Signed)
Pt state lower back pain that travels to her left hip and knee. Pt state walking, standing and sitting makes the pain worse. Pt state she takes pain meds to ease her pain.  Numeric Pain Rating Scale and Functional Assessment Average Pain 7   In the last MONTH (on 0-10 scale) has pain interfered with the following?  1. General activity like being  able to carry out your everyday physical activities such as walking, climbing stairs, carrying groceries, or moving a chair?  Rating(10)   +Driver, -BT, -Dye Allergies.

## 2022-06-02 NOTE — Patient Instructions (Signed)

## 2022-06-09 NOTE — Progress Notes (Signed)
Tracey Armstrong - 87 y.o. female MRN [id number]  Date of birth: 08/27/1935  Office Visit Note: Visit Date: 06/02/2022 PCP: Martinique, [name], MD Referred by: Martinique, [name], MD  Subjective: Chief Complaint  Patient presents with   Lower Back - Pain   Left Hip - Pain   Left Knee - Pain   HPI:  Tracey Armstrong is a 87 y.o. female who comes in today for planned repeat Left L5-S1  Lumbar Interlaminar epidural steroid injection with fluoroscopic guidance.  The patient has failed conservative care including home exercise, medications, time and activity modification.  This injection will be diagnostic and hopefully therapeutic.  Please see requesting physician notes for further details and justification. Patient received more than 50% pain relief from prior injection.   Referring: Dr. Jean Rosenthal and Barnet Pall, FNP   ROS Otherwise per HPI.  Assessment & Plan: Visit Diagnoses:    ICD-10-CM   1. Lumbar radiculopathy  M54.16 XR C-ARM NO REPORT    Epidural Steroid injection    methylPREDNISolone acetate (DEPO-MEDROL) injection 80 mg      Plan: No additional findings.   Meds & Orders:  Meds ordered this encounter  Medications   methylPREDNISolone acetate (DEPO-MEDROL) injection 80 mg    Orders Placed This Encounter  Procedures   XR C-ARM NO REPORT   Epidural Steroid injection    Follow-up: Return for visit to requesting provider as needed.   Procedures: No procedures performed  Lumbar Epidural Steroid Injection - Interlaminar Approach with Fluoroscopic Guidance  Patient: Tracey Armstrong      Date of Birth: 1934/11/26 MRN: [id number] PCP: Martinique, [name], MD      Visit Date: 06/02/2022   Universal Protocol:     Consent Given By: the patient  Position: PRONE  Additional Comments: Vital signs were monitored before and after the procedure. Patient was prepped and draped in the usual sterile fashion. The correct patient, procedure, and site was  verified.   Injection Procedure Details:   Procedure diagnoses: Lumbar radiculopathy [M54.16]   Meds Administered:  Meds ordered this encounter  Medications   methylPREDNISolone acetate (DEPO-MEDROL) injection 80 mg     Laterality: Left  Location/Site:  L5-S1  Needle: 3.5 in., 20 ga. Tuohy  Needle Placement: Paramedian epidural  Findings:   -Comments: Excellent flow of contrast into the epidural space.  Procedure Details: Using a paramedian approach from the side mentioned above, the region overlying the inferior lamina was localized under fluoroscopic visualization and the soft tissues overlying this structure were infiltrated with 4 ml. of 1% Lidocaine without Epinephrine. The Tuohy needle was inserted into the epidural space using a paramedian approach.   The epidural space was localized using loss of resistance along with counter oblique bi-planar fluoroscopic views.  After negative aspirate for air, blood, and CSF, a 2 ml. volume of Isovue-250 was injected into the epidural space and the flow of contrast was observed. Radiographs were obtained for documentation purposes.    The injectate was administered into the level noted above.   Additional Comments:  No complications occurred Dressing: 2 x 2 sterile gauze and Band-Aid    Post-procedure details: Patient was observed during the procedure. Post-procedure instructions were reviewed.  Patient left the clinic in stable condition.   Clinical History: No specialty comments available.     Objective:  VS:  HT:    WT:   BMI:     BP:123/74  HR:83bpm  TEMP: ( )  RESP:  Physical Exam Vitals  and nursing note reviewed.  Constitutional:      General: She is not in acute distress.    Appearance: Normal appearance. She is not ill-appearing.  HENT:     Head: Normocephalic and atraumatic.     Right Ear: External ear normal.     Left Ear: External ear normal.  Eyes:     Extraocular Movements: Extraocular movements  intact.  Cardiovascular:     Rate and Rhythm: Normal rate.     Pulses: Normal pulses.  Pulmonary:     Effort: Pulmonary effort is normal. No respiratory distress.  Abdominal:     General: There is no distension.     Palpations: Abdomen is soft.  Musculoskeletal:        General: Tenderness present.     Cervical back: Neck supple.     Right lower leg: No edema.     Left lower leg: No edema.     Comments: Patient has good distal strength with no pain over the greater trochanters.  No clonus or focal weakness.  Skin:    Findings: No erythema, lesion or rash.  Neurological:     General: No focal deficit present.     Mental Status: She is alert and oriented to person, place, and time.     Sensory: No sensory deficit.     Motor: No weakness or abnormal muscle tone.     Coordination: Coordination normal.  Psychiatric:        Mood and Affect: Mood normal.        Behavior: Behavior normal.      Imaging: No results found.

## 2022-06-09 NOTE — Procedures (Signed)
Lumbar Epidural Steroid Injection - Interlaminar Approach with Fluoroscopic Guidance  Patient: Tracey Armstrong      Date of Birth: 10-10-1935 MRN: [id number] PCP: Martinique, [name], MD      Visit Date: 06/02/2022   Universal Protocol:     Consent Given By: the patient  Position: PRONE  Additional Comments: Vital signs were monitored before and after the procedure. Patient was prepped and draped in the usual sterile fashion. The correct patient, procedure, and site was verified.   Injection Procedure Details:   Procedure diagnoses: Lumbar radiculopathy [M54.16]   Meds Administered:  Meds ordered this encounter  Medications   methylPREDNISolone acetate (DEPO-MEDROL) injection 80 mg     Laterality: Left  Location/Site:  L5-S1  Needle: 3.5 in., 20 ga. Tuohy  Needle Placement: Paramedian epidural  Findings:   -Comments: Excellent flow of contrast into the epidural space.  Procedure Details: Using a paramedian approach from the side mentioned above, the region overlying the inferior lamina was localized under fluoroscopic visualization and the soft tissues overlying this structure were infiltrated with 4 ml. of 1% Lidocaine without Epinephrine. The Tuohy needle was inserted into the epidural space using a paramedian approach.   The epidural space was localized using loss of resistance along with counter oblique bi-planar fluoroscopic views.  After negative aspirate for air, blood, and CSF, a 2 ml. volume of Isovue-250 was injected into the epidural space and the flow of contrast was observed. Radiographs were obtained for documentation purposes.    The injectate was administered into the level noted above.   Additional Comments:  No complications occurred Dressing: 2 x 2 sterile gauze and Band-Aid    Post-procedure details: Patient was observed during the procedure. Post-procedure instructions were reviewed.  Patient left the clinic in stable condition.

## 2022-06-18 DIAGNOSIS — M6281 Muscle weakness (generalized): Secondary | ICD-10-CM | POA: Diagnosis not present

## 2022-06-18 DIAGNOSIS — M5459 Other low back pain: Secondary | ICD-10-CM | POA: Diagnosis not present

## 2022-06-25 DIAGNOSIS — M6281 Muscle weakness (generalized): Secondary | ICD-10-CM | POA: Diagnosis not present

## 2022-06-25 DIAGNOSIS — M5459 Other low back pain: Secondary | ICD-10-CM | POA: Diagnosis not present

## 2022-06-27 DIAGNOSIS — M6281 Muscle weakness (generalized): Secondary | ICD-10-CM | POA: Diagnosis not present

## 2022-06-27 DIAGNOSIS — M5459 Other low back pain: Secondary | ICD-10-CM | POA: Diagnosis not present

## 2022-06-30 DIAGNOSIS — H00011 Hordeolum externum right upper eyelid: Secondary | ICD-10-CM | POA: Diagnosis not present

## 2022-07-01 DIAGNOSIS — M6281 Muscle weakness (generalized): Secondary | ICD-10-CM | POA: Diagnosis not present

## 2022-07-01 DIAGNOSIS — M5459 Other low back pain: Secondary | ICD-10-CM | POA: Diagnosis not present

## 2022-07-03 DIAGNOSIS — M5459 Other low back pain: Secondary | ICD-10-CM | POA: Diagnosis not present

## 2022-07-03 DIAGNOSIS — H9313 Tinnitus, bilateral: Secondary | ICD-10-CM | POA: Diagnosis not present

## 2022-07-03 DIAGNOSIS — M6281 Muscle weakness (generalized): Secondary | ICD-10-CM | POA: Diagnosis not present

## 2022-07-07 DIAGNOSIS — M5459 Other low back pain: Secondary | ICD-10-CM | POA: Diagnosis not present

## 2022-07-07 DIAGNOSIS — M6281 Muscle weakness (generalized): Secondary | ICD-10-CM | POA: Diagnosis not present

## 2022-07-10 DIAGNOSIS — M5459 Other low back pain: Secondary | ICD-10-CM | POA: Diagnosis not present

## 2022-07-10 DIAGNOSIS — M6281 Muscle weakness (generalized): Secondary | ICD-10-CM | POA: Diagnosis not present

## 2022-07-15 DIAGNOSIS — M6281 Muscle weakness (generalized): Secondary | ICD-10-CM | POA: Diagnosis not present

## 2022-07-15 DIAGNOSIS — M5459 Other low back pain: Secondary | ICD-10-CM | POA: Diagnosis not present

## 2022-07-16 DIAGNOSIS — H00011 Hordeolum externum right upper eyelid: Secondary | ICD-10-CM | POA: Diagnosis not present

## 2022-07-16 DIAGNOSIS — H52201 Unspecified astigmatism, right eye: Secondary | ICD-10-CM | POA: Diagnosis not present

## 2022-07-17 DIAGNOSIS — M6281 Muscle weakness (generalized): Secondary | ICD-10-CM | POA: Diagnosis not present

## 2022-07-17 DIAGNOSIS — M5459 Other low back pain: Secondary | ICD-10-CM | POA: Diagnosis not present

## 2022-07-18 DIAGNOSIS — R300 Dysuria: Secondary | ICD-10-CM | POA: Diagnosis not present

## 2022-07-29 DIAGNOSIS — M6281 Muscle weakness (generalized): Secondary | ICD-10-CM | POA: Diagnosis not present

## 2022-07-29 DIAGNOSIS — M5459 Other low back pain: Secondary | ICD-10-CM | POA: Diagnosis not present

## 2022-07-29 NOTE — Progress Notes (Signed)
HPI: [name] is a 87 y.o. female with medical hx significant for seizure disorder, HLD, depression,migraine headache,lumbar stenosis with radiculopathy, anxiety, and GERD here today for chronic disease management, 5 months follow up. Last seen on 05/14/22. She reports having several bladder infections since her last visit .She was prescribed Augmentin on September 29th at Urgent Care. Ucx was not ordered. 02/26/22 Ucx: ISOLATE 1: ESBL Klebsiella pneumoniae Abnormal    Comment: Greater than 100,000 CFU/mL of Klebsiella pneumoniae (ESBL) ESBL RESULT:        The organism has been confirmed as an ESBL producer.   She states that form many years she has had intermittent UTI's, she was told that she had a "fallen kidney lying on top of her bladder." She is currently experiencing difficulty voiding and intermittent burning sensations. She is on Oxybutynin XL 10 mg daily for overactive bladder, still having urinary frequency and episodes of incontinence.  C/O severe lowe back pain radiated to LE's, L>R. She received an epidural injection about a year ago and last saw Dr. Ernestina Armstrong on June 02, 2022. She is taking gabapentin 300 mg daily.  Anxiety:She is on Xanax 1 mg 1/2-1 tab bid, she gets 45 tabs per month. She has taken this medication for years, still helping. She has not tolerated SSRI's and SNRI's, aggravated headaches. She sleeps approximately 6-7 hours a night.   HTN: She is also taking verapamil  180 mg daily, which has also helped with migraine headaches.  Denies severe/frequent headache, visual changes, chest pain, dyspnea, focal weakness, or edema. Lab Results  Component Value Date   CREATININE 0.94 10/28/2021   BUN 16 10/28/2021   NA 136 10/28/2021   K 4.2 10/28/2021   CL 100 10/28/2021   CO2 29 10/28/2021   HLD: She is no on pharmacologic treatment. Aortic atherosclerosis seen on imaging done in 03/2021, abdominal CT. She has not tolerated statins.  Lab Results   Component Value Date   CHOL 207 (H) 04/12/2021   HDL 70 04/12/2021   LDLCALC 121 (H) 04/12/2021   TRIG 80 04/12/2021   CHOLHDL 3.0 04/12/2021   Vit D deficiency: She is on Ergocalciferol 50,000 U q 2 weeks.  Review of Systems  Constitutional:  Negative for activity change, appetite change and fever.  HENT:  Negative for mouth sores, nosebleeds and sore throat.   Respiratory:  Negative for cough and wheezing.   Gastrointestinal:  Negative for abdominal pain, nausea and vomiting.       Negative for changes in bowel habits.  Endocrine: Negative for cold intolerance and heat intolerance.  Genitourinary:  Negative for decreased urine volume and hematuria.  Musculoskeletal:  Positive for arthralgias, back pain and gait problem.  Neurological:  Negative for syncope and facial asymmetry.  Psychiatric/Behavioral:  Positive for sleep disturbance. Negative for confusion. The patient is nervous/anxious.   Rest of ROS: See pertinent positives and negatives in HPI.  Current Outpatient Medications on File Prior to Visit  Medication Sig Dispense Refill   ALPRAZolam (XANAX) 1 MG tablet TAKE ONE-HALF TABLET BY MOUTH AT NOON AND 1 EVERY NIGHT AT BEDTIME. 45 tablet 2   antiseptic oral rinse (BIOTENE) LIQD Use 3 mls 3-4 times per day for dry mouth. Do not swallow. 237 mL 0   Cholecalciferol (VITAMIN D3) 1.25 MG (50000 UT) CAPS TAKE 1 CAPSULE BY MOUTH EVERY 14 DAYS 7 capsule 1   Dextran 70-Hypromellose (ARTIFICIAL TEARS PF OP) Place 1-2 drops into both eyes See admin instructions. Instill 1-2 drops into  both eyes at bedtime and an additional 2-3 times a day as needed for dryness     diclofenac (FLECTOR) 1.3 % PTCH Place 1 patch onto the skin 2 (two) times daily. 60 patch 1   diphenhydramine-acetaminophen (TYLENOL PM) 25-500 MG TABS tablet Take 1.5 tablets by mouth at bedtime.     docusate sodium (COLACE) 100 MG capsule Take 1 capsule (100 mg total) by mouth 2 (two) times daily. 10 capsule 0   gabapentin  (NEURONTIN) 300 MG capsule Take 1 capsule every afternoon 90 capsule 3   levETIRAcetam (KEPPRA) 500 MG tablet TAKE 1 TABLET(500 MG) BY MOUTH TWICE DAILY 180 tablet 0   Magnesium 400 MG TABS Take 400 mg by mouth at bedtime.     MAXALT-MLT 10 MG disintegrating tablet DISSOLVE ONE TABLET BY MOUTH AS NEEDED HEADACHE. 9 tablet 11   Melatonin 10 MG TABS Take 10 mg by mouth at bedtime as needed (for sleep).     omeprazole (PRILOSEC OTC) 20 MG tablet Take 20 mg by mouth daily before breakfast.     POTASSIUM PO Take 1 tablet by mouth daily with lunch.     pyridOXINE (VITAMIN B-6) 100 MG tablet Take 400 mg by mouth daily with lunch.     riboflavin (VITAMIN B-2) 100 MG TABS tablet Take 400 mg by mouth daily with lunch.     tretinoin (RETIN-A) 0.1 % cream APPLY TO AFFECTED AREA EVERY DAY AT BEDTIME (Patient taking differently: Apply 1 application  topically at bedtime.) 45 g 2   verapamil (VERELAN PM) 180 MG 24 hr capsule TAKE 1 CAPSULE(180 MG) BY MOUTH DAILY 90 capsule 3   No current facility-administered medications on file prior to visit.   Past Medical History:  Diagnosis Date   Anxiety    Arthritis    Chronic insomnia 03/30/2015   Chronic low back pain 12/02/2016   Depression    GERD (gastroesophageal reflux disease)    Hiatal hernia    High cholesterol    Hypertelorism    Hypertension    Meningioma (HCC)    Migraine    Allergies  Allergen Reactions   Dihydrotachysterol Other (See Comments)    Dihydrotachysterol is a form of vitamin D. Might be "DHT Intensol" (Name Brand)- Reaction unknown- tolerating 50,000 units of Vitamin D-2 (Drisdol) in 2022   Trazodone And Nefazodone Other (See Comments)    Worsens headaches   Valium [Diazepam] Other (See Comments)    headaches   Citalopram Other (See Comments)    Other reaction(s): Headache   Other Other (See Comments)    "States all antidepressants cause migraines." - tolerates Xanax    Social History   Socioeconomic History   Marital  status: Widowed    Spouse name: Not on file   Number of children: 4   Years of education: 10   Highest education level: Not on file  Occupational History   Occupation: Retired  Tobacco Use   Smoking status: Never   Smokeless tobacco: Never  Vaping Use   Vaping Use: Never used  Substance and Sexual Activity   Alcohol use: No   Drug use: No   Sexual activity: Not Currently  Other Topics Concern   Not on file  Social History Narrative   Lives in [place] [facility]      Patient drinks 1 glass of tea daily.      Patient is right handed.   Social Determinants of Health   Financial Resource Strain: Low Risk  (11/18/2021)  Overall Financial Resource Strain (CARDIA)    Difficulty of Paying Living Expenses: Not hard at all  Food Insecurity: No Food Insecurity (11/18/2021)   Hunger Vital Sign    Worried About Running Out of Food in the Last Year: Never true    Ran Out of Food in the Last Year: Never true  Transportation Needs: No Transportation Needs (11/18/2021)   PRAPARE - Hydrologist (Medical): No    Lack of Transportation (Non-Medical): No  Physical Activity: Insufficiently Active (11/18/2021)   Exercise Vital Sign    Days of Exercise per Week: 7 days    Minutes of Exercise per Session: 20 min  Stress: No Stress Concern Present (11/18/2021)   [place]    Feeling of Stress : Not at all  Social Connections: Moderately Integrated (11/18/2021)   Social Connection and Isolation Panel [NHANES]    Frequency of Communication with Friends and Family: More than three times a week    Frequency of Social Gatherings with Friends and Family: More than three times a week    Attends Religious Services: 1 to 4 times per year    Active Member of [employer] or Organizations: Yes    Attends Archivist Meetings: 1 to 4 times per year    Marital Status: Widowed   Vitals:   07/30/22 1107  BP:  126/70  Pulse: 83  Resp: 16  SpO2: 97%  Body mass index is 29.86 kg/m. Physical Exam Vitals and nursing note reviewed.  Constitutional:      General: She is not in acute distress.    Appearance: She is well-developed and well-groomed.  HENT:     Head: Normocephalic and atraumatic.     Mouth/Throat:     Mouth: Mucous membranes are moist.     Pharynx: Oropharynx is clear.  Eyes:     Conjunctiva/sclera: Conjunctivae normal.  Cardiovascular:     Rate and Rhythm: Normal rate and regular rhythm.     Heart sounds: Murmur (Soft SEM LUSB) heard.     Comments: DP pulses palpable. Pulmonary:     Effort: Pulmonary effort is normal. No respiratory distress.     Breath sounds: Normal breath sounds.  Abdominal:     Palpations: Abdomen is soft.     Tenderness: There is no abdominal tenderness.  Musculoskeletal:     Right lower leg: No edema.     Left lower leg: No edema.  Skin:    General: Skin is warm.     Findings: No erythema or rash.  Neurological:     General: No focal deficit present.     Mental Status: She is alert and oriented to person, place, and time.     Cranial Nerves: No cranial nerve deficit.     Gait: Gait normal.     Comments: Gait assisted with a walker.  Psychiatric:        Mood and Affect: Mood is anxious.   ASSESSMENT AND PLAN:  Ms.Mariateresa was seen today for follow-up.  Diagnoses and all orders for this visit: Orders Placed This Encounter  Procedures   Comprehensive metabolic panel   VITAMIN D 25 Hydroxy (Vit-D Deficiency, Fractures)   Ambulatory referral to Urology   Lab Results  Component Value Date   CREATININE 0.90 07/30/2022   BUN 11 07/30/2022   NA 136 07/30/2022   K 4.4 07/30/2022   CL 100 07/30/2022   CO2 30 07/30/2022   Lab  Results  Component Value Date   ALT 15 07/30/2022   AST 19 07/30/2022   ALKPHOS 55 07/30/2022   BILITOT 0.3 07/30/2022   Essential hypertension BP adequately controlled. Continue Verapamil 180 mg daily and low salt  diet. Soft heart murmur on auscultation today. Echo 03/2021 showed trivial mitral regurgitation: - No immediate intervention needed at this time, instructed to monitor for any new symptom such as chest pain,palpitation, or SOB.  Atherosclerosis of aorta (HCC) Hx of intolerance to statins, she is not interested in trying again.  Generalized anxiety disorder Stable. Continue Xanax 1 mg 1/2-1 tab bod prn. Some some side effects discussed. PMP reviewed, Rx sent.  Recurrent UTI Intermittent dysuria, not having symptoms at this time, recently completed abx treatment. We discussed possible causes, ? vaginal atrophy. Refer to urologist for further evaluation and management. Discontinue oxybutynin due to potential side effect of urinary retention. Monitor for any changes in urinary symptoms.  Urine, incontinence, stress female Oxybutynin does not seem to be helping, so discontinued. Urology referral placed.  Vitamin D deficiency, unspecified Continue same dose of vit D supplementation. Further recommendations according to 25 OH vit D results.  Chronic low back pain with sciatica, sciatica laterality unspecified, unspecified back pain laterality Continue gabapentin 300 mg daily. Last epidural injection 05/2022. Continue following with Dr Tracey Armstrong. Fall precautions discussed.  Seizure disorder: - Continue generic Keppra (levetiracetam) as prescribed. - Follow up with neurologist on October 25th.  Return in about 5 months (around 12/29/2022).  Ruger Saxer G. [place], MD  [facility]. [place] office.

## 2022-07-30 ENCOUNTER — Encounter: Payer: Self-pay | Admitting: Family Medicine

## 2022-07-30 ENCOUNTER — Ambulatory Visit (INDEPENDENT_AMBULATORY_CARE_PROVIDER_SITE_OTHER): Payer: Medicare Other | Admitting: Family Medicine

## 2022-07-30 VITALS — BP 126/70 | HR 83 | Resp 16 | Ht 62.0 in | Wt 163.2 lb

## 2022-07-30 DIAGNOSIS — G8929 Other chronic pain: Secondary | ICD-10-CM

## 2022-07-30 DIAGNOSIS — F411 Generalized anxiety disorder: Secondary | ICD-10-CM

## 2022-07-30 DIAGNOSIS — M5440 Lumbago with sciatica, unspecified side: Secondary | ICD-10-CM | POA: Diagnosis not present

## 2022-07-30 DIAGNOSIS — I10 Essential (primary) hypertension: Secondary | ICD-10-CM

## 2022-07-30 DIAGNOSIS — N393 Stress incontinence (female) (male): Secondary | ICD-10-CM | POA: Diagnosis not present

## 2022-07-30 DIAGNOSIS — E559 Vitamin D deficiency, unspecified: Secondary | ICD-10-CM

## 2022-07-30 DIAGNOSIS — R39198 Other difficulties with micturition: Secondary | ICD-10-CM

## 2022-07-30 DIAGNOSIS — N390 Urinary tract infection, site not specified: Secondary | ICD-10-CM | POA: Diagnosis not present

## 2022-07-30 DIAGNOSIS — I700 Atherosclerosis of aorta: Secondary | ICD-10-CM

## 2022-07-30 DIAGNOSIS — G40909 Epilepsy, unspecified, not intractable, without status epilepticus: Secondary | ICD-10-CM | POA: Diagnosis not present

## 2022-07-30 LAB — COMPREHENSIVE METABOLIC PANEL
ALT: 15 U/L (ref 0–35)
AST: 19 U/L (ref 0–37)
Albumin: 4 g/dL (ref 3.5–5.2)
Alkaline Phosphatase: 55 U/L (ref 39–117)
BUN: 11 mg/dL (ref 6–23)
CO2: 30 mEq/L (ref 19–32)
Calcium: 9.5 mg/dL (ref 8.4–10.5)
Chloride: 100 mEq/L (ref 96–112)
Creatinine, Ser: 0.9 mg/dL (ref 0.40–1.20)
GFR: 58.26 mL/min — ABNORMAL LOW (ref >60.00)
Glucose, Bld: 94 mg/dL (ref 70–99)
Potassium: 4.4 mEq/L (ref 3.5–5.1)
Sodium: 136 mEq/L (ref 135–145)
Total Bilirubin: 0.3 mg/dL (ref 0.2–1.2)
Total Protein: 7 g/dL (ref 6.0–8.3)

## 2022-07-30 LAB — VITAMIN D 25 HYDROXY (VIT D DEFICIENCY, FRACTURES): VITD: 74.2 ng/mL (ref 30.00–100.00)

## 2022-07-30 NOTE — Patient Instructions (Addendum)
A few things to remember from today's visit:  Essential hypertension - Plan: Comprehensive metabolic panel  Atherosclerosis of aorta (HCC)  Generalized anxiety disorder  Recurrent UTI  Difficulty urinating  Urine, incontinence, stress female - Plan: Ambulatory referral to Urology  Vitamin D deficiency, unspecified - Plan: VITAMIN D 25 Hydroxy (Vit-D Deficiency, Fractures)  Stop Oxybutynin. No changes in rest of your meds today. Continue following with orthopedist for knee and back pain. Keep appt with Dr Delice Lesch.  If you need refills for medications you take chronically, please call your pharmacy. Do not use My Chart to request refills or for acute issues that need immediate attention. If you send a my chart message, it may take a few days to be addressed, specially if I am not in the office.  Please be sure medication list is accurate. If a new problem present, please set up appointment sooner than planned today.

## 2022-07-31 DIAGNOSIS — M6281 Muscle weakness (generalized): Secondary | ICD-10-CM | POA: Diagnosis not present

## 2022-07-31 DIAGNOSIS — M5459 Other low back pain: Secondary | ICD-10-CM | POA: Diagnosis not present

## 2022-08-02 MED ORDER — ALPRAZOLAM 1 MG PO TABS: ORAL_TABLET | ORAL | 3 refills | Status: DC

## 2022-08-03 ENCOUNTER — Other Ambulatory Visit: Payer: Self-pay | Admitting: Family Medicine

## 2022-08-03 DIAGNOSIS — N393 Stress incontinence (female) (male): Secondary | ICD-10-CM

## 2022-08-05 DIAGNOSIS — M6281 Muscle weakness (generalized): Secondary | ICD-10-CM | POA: Diagnosis not present

## 2022-08-05 DIAGNOSIS — M5459 Other low back pain: Secondary | ICD-10-CM | POA: Diagnosis not present

## 2022-08-07 DIAGNOSIS — R300 Dysuria: Secondary | ICD-10-CM | POA: Diagnosis not present

## 2022-08-07 DIAGNOSIS — N390 Urinary tract infection, site not specified: Secondary | ICD-10-CM | POA: Diagnosis not present

## 2022-08-11 ENCOUNTER — Other Ambulatory Visit: Payer: Self-pay | Admitting: Physical Medicine and Rehabilitation

## 2022-08-11 ENCOUNTER — Ambulatory Visit (INDEPENDENT_AMBULATORY_CARE_PROVIDER_SITE_OTHER): Payer: Medicare Other | Admitting: Physician Assistant

## 2022-08-11 ENCOUNTER — Encounter: Payer: Self-pay | Admitting: Physician Assistant

## 2022-08-11 ENCOUNTER — Ambulatory Visit (INDEPENDENT_AMBULATORY_CARE_PROVIDER_SITE_OTHER): Payer: Medicare Other

## 2022-08-11 DIAGNOSIS — M79605 Pain in left leg: Secondary | ICD-10-CM | POA: Diagnosis not present

## 2022-08-11 DIAGNOSIS — M1712 Unilateral primary osteoarthritis, left knee: Secondary | ICD-10-CM

## 2022-08-11 DIAGNOSIS — M7062 Trochanteric bursitis, left hip: Secondary | ICD-10-CM | POA: Diagnosis not present

## 2022-08-11 DIAGNOSIS — M5416 Radiculopathy, lumbar region: Secondary | ICD-10-CM

## 2022-08-11 MED ORDER — LIDOCAINE HCL 1 % IJ SOLN
3.0000 mL | INTRAMUSCULAR | Status: AC | PRN
  Administered 2022-08-11: 3 mL

## 2022-08-11 MED ORDER — METHYLPREDNISOLONE ACETATE 40 MG/ML IJ SUSP
40.0000 mg | INTRAMUSCULAR | Status: AC | PRN
  Administered 2022-08-11: 40 mg via INTRA_ARTICULAR

## 2022-08-11 NOTE — Progress Notes (Signed)
   Procedure Note  Patient: Tracey Armstrong             Date of Birth: 1935/08/16           MRN: [id number]             Visit Date: 08/11/2022 HPI: [name] comes in today with bilateral knee pain and left lateral and mid thigh pain.  She is requesting injections of both knees today.  She is also wanting [place] to look at her left femur.  Again in 2021 she sustained a distal third shaft left femur fracture and underwent an IM nailing of the femur.  She states she has not been back to see this physician in some time.  She is wondering if there is been any change in the hardware.  Films from June of this year showed abundant callus formation.  Prior CT scan October 2022 showed an incomplete union of the fracture site itself.  She has had no new injury to the left femur. Reports that cortisone injections both knees back on 05/18/2022 helped till recently.  She denies any fevers chills.  Review of systems: See HPI otherwise negative  Physical exam: Bilateral knees good range of motion of both knees.  No abnormal warmth erythema or effusion.  Patellofemoral crepitus bilateral knees. Left hip good range of motion with mild pain.  She has tenderness over the left trochanteric region and down the IT band.  Radiographs: Left femur 2 views: Abundant callus formation at the fracture site.  Lateral view shows slight lucency still present could represent incomplete union.  No hardware failure.  Procedures: Visit Diagnoses:  1. Pain in left leg   2. Greater trochanteric bursitis, left   3. Unilateral primary osteoarthritis, left knee     Large Joint Inj: L knee on 08/11/2022 11:10 AM Indications: pain Details: 22 G 1.5 in needle, anterolateral approach  Arthrogram: No  Medications: 3 mL lidocaine 1 %; 40 mg methylPREDNISolone acetate 40 MG/ML Outcome: tolerated well, no immediate complications Procedure, treatment alternatives, risks and benefits explained, specific risks discussed. Consent was given by  the patient. Immediately prior to procedure a time out was called to verify the correct patient, procedure, equipment, support staff and site/side marked as required. Patient was prepped and draped in the usual sterile fashion.    Large Joint Inj: L greater trochanter on 08/11/2022 11:10 AM Indications: pain Details: 22 G 1.5 in needle, lateral approach  Arthrogram: No  Medications: 3 mL lidocaine 1 %; 40 mg methylPREDNISolone acetate 40 MG/ML Outcome: tolerated well, no immediate complications Procedure, treatment alternatives, risks and benefits explained, specific risks discussed. Consent was given by the patient. Immediately prior to procedure a time out was called to verify the correct patient, procedure, equipment, support staff and site/side marked as required. Patient was prepped and draped in the usual sterile fashion.     Plan: Discussed with her possible reimaging the left femur fracture to evaluate for incomplete union.  Also discussed what could be done if this was an incomplete union again this would be moving the rod and placing a larger rod after reaming and possible bone graft.  She is lateral interested in any type of surgical intervention therefore would not recommend CT scanning of the femur.  Did recommend IT band stretching.  We will see her back in 2 weeks for injection of the right knee.  Questions were encouraged and answered at length today

## 2022-08-12 ENCOUNTER — Telehealth: Payer: Self-pay | Admitting: Family Medicine

## 2022-08-12 ENCOUNTER — Other Ambulatory Visit: Payer: Self-pay | Admitting: Family Medicine

## 2022-08-12 DIAGNOSIS — F411 Generalized anxiety disorder: Secondary | ICD-10-CM

## 2022-08-12 NOTE — Telephone Encounter (Signed)
This was sent in on 10/14.

## 2022-08-12 NOTE — Telephone Encounter (Signed)
Refill Request:  ALPRAZolam Duanne Moron) 1 MG tablet  [id number] - [place], [place] [address] AT [facility] [address] Phone:  [phone]  Fax:  [phone]     LOV:  07/30/22

## 2022-08-13 ENCOUNTER — Encounter: Payer: Self-pay | Admitting: Neurology

## 2022-08-13 ENCOUNTER — Ambulatory Visit (INDEPENDENT_AMBULATORY_CARE_PROVIDER_SITE_OTHER): Payer: Medicare Other | Admitting: Neurology

## 2022-08-13 VITALS — BP 124/77 | HR 85 | Resp 20 | Ht 62.0 in | Wt 159.0 lb

## 2022-08-13 DIAGNOSIS — G43009 Migraine without aura, not intractable, without status migrainosus: Secondary | ICD-10-CM

## 2022-08-13 DIAGNOSIS — R569 Unspecified convulsions: Secondary | ICD-10-CM

## 2022-08-13 DIAGNOSIS — M5416 Radiculopathy, lumbar region: Secondary | ICD-10-CM

## 2022-08-13 MED ORDER — LEVETIRACETAM 500 MG PO TABS: ORAL_TABLET | ORAL | 3 refills | Status: DC

## 2022-08-13 MED ORDER — GABAPENTIN 300 MG PO CAPS: ORAL_CAPSULE | ORAL | 3 refills | Status: DC

## 2022-08-13 NOTE — Patient Instructions (Signed)
Good to see you.  Continue Levetiracetam (Keppra) '500mg'$  twice a day  2. Continue Gabapentin '300mg'$  every afternoon  3. Please speak to your PCP about the nasal discharge, proceed with hearing evaluation  4. Follow-up in 8 months, call for any changes   Seizure Precautions: 1. If medication has been prescribed for you to prevent seizures, take it exactly as directed.  Do not stop taking the medicine without talking to your doctor first, even if you have not had a seizure in a long time.   2. Avoid activities in which a seizure would cause danger to yourself or to others.  Don't operate dangerous machinery, swim alone, or climb in high or dangerous places, such as on ladders, roofs, or girders.  Do not drive unless your doctor says you may.  3. If you have any warning that you may have a seizure, lay down in a safe place where you can't hurt yourself.    4.  No driving for 6 months from last seizure, as per [facility].   Please refer to the following link on the [facility] website for more information: http://[email or web address]/[email or web address]/drivingu.cfm   5.  Maintain good sleep hygiene. Avoid alcohol.  6.  Contact your doctor if you have any problems that may be related to the medicine you are taking.  7.  Call 911 and bring the patient back to the ED if:        A.  The seizure lasts longer than 5 minutes.       B.  The patient doesn't awaken shortly after the seizure  C.  The patient has new problems such as difficulty seeing, speaking or moving  D.  The patient was injured during the seizure  E.  The patient has a temperature over 102 F (39C)  F.  The patient vomited and now is having trouble breathing
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
## 2022-08-15 ENCOUNTER — Telehealth: Payer: Self-pay | Admitting: Family Medicine

## 2022-08-15 NOTE — Telephone Encounter (Signed)
Tracey Armstrong (Caregiver) called to say the pharmacy is still telling them that MD is refusing to refill the Rx for: ALPRAZolam (XANAX) 1 MG tablet  Pt is completely out of this medication.   [facility] [place], [place] AT [address] Phone:  [phone]  Fax:  [phone]     Caregiver is saying they need this medication by 1 pm, because she has to leave and there is no one else that will be available to pick it up.   Caregiver and Pt, who is currently on speaker phone, were both informed that unfortunately MD is not in the office today.   LOV:  07/30/22

## 2022-08-15 NOTE — Telephone Encounter (Signed)
Error. Please disregard

## 2022-08-18 ENCOUNTER — Other Ambulatory Visit: Payer: Self-pay | Admitting: Family Medicine

## 2022-08-18 ENCOUNTER — Encounter: Payer: Medicare Other | Admitting: Physical Medicine and Rehabilitation

## 2022-08-18 DIAGNOSIS — F411 Generalized anxiety disorder: Secondary | ICD-10-CM

## 2022-08-18 DIAGNOSIS — Z23 Encounter for immunization: Secondary | ICD-10-CM | POA: Diagnosis not present

## 2022-08-18 MED ORDER — ALPRAZOLAM 1 MG PO TABS: ORAL_TABLET | ORAL | 3 refills | Status: DC

## 2022-08-18 NOTE — Addendum Note (Signed)
Addended by: Rodrigo Ran on: 08/18/2022 09:18 AM   Modules accepted: Orders

## 2022-08-18 NOTE — Telephone Encounter (Signed)
I called and spoke with pharmacy. They did not receive Rx from 10/14. Rx phoned in & they will get it ready for patient now.

## 2022-08-19 DIAGNOSIS — M6281 Muscle weakness (generalized): Secondary | ICD-10-CM | POA: Diagnosis not present

## 2022-08-19 DIAGNOSIS — M5459 Other low back pain: Secondary | ICD-10-CM | POA: Diagnosis not present

## 2022-08-21 DIAGNOSIS — M5459 Other low back pain: Secondary | ICD-10-CM | POA: Diagnosis not present

## 2022-08-21 DIAGNOSIS — M6281 Muscle weakness (generalized): Secondary | ICD-10-CM | POA: Diagnosis not present

## 2022-08-27 ENCOUNTER — Ambulatory Visit (INDEPENDENT_AMBULATORY_CARE_PROVIDER_SITE_OTHER): Payer: Medicare Other | Admitting: Physical Medicine and Rehabilitation

## 2022-08-27 ENCOUNTER — Ambulatory Visit: Payer: Medicare Other

## 2022-08-27 VITALS — BP 122/76 | HR 80

## 2022-08-27 DIAGNOSIS — M5416 Radiculopathy, lumbar region: Secondary | ICD-10-CM

## 2022-08-27 MED ORDER — METHYLPREDNISOLONE ACETATE 80 MG/ML IJ SUSP
40.0000 mg | Freq: Once | INTRAMUSCULAR | Status: AC
  Administered 2022-08-27: 40 mg

## 2022-08-27 NOTE — Progress Notes (Signed)
Numeric Pain Rating Scale and Functional Assessment Average Pain 9   In the last MONTH (on 0-10 scale) has pain interfered with the following?  1. General activity like being  able to carry out your everyday physical activities such as walking, climbing stairs, carrying groceries, or moving a chair?  Rating(9)   +Driver, -BT, -Dye Allergies.  Left hip pain

## 2022-08-27 NOTE — Patient Instructions (Signed)

## 2022-09-05 DIAGNOSIS — M5440 Lumbago with sciatica, unspecified side: Secondary | ICD-10-CM | POA: Diagnosis not present

## 2022-09-05 DIAGNOSIS — N3020 Other chronic cystitis without hematuria: Secondary | ICD-10-CM | POA: Diagnosis not present

## 2022-09-05 DIAGNOSIS — N3941 Urge incontinence: Secondary | ICD-10-CM | POA: Diagnosis not present

## 2022-09-07 ENCOUNTER — Emergency Department (HOSPITAL_COMMUNITY)
Admission: EM | Admit: 2022-09-07 | Discharge: 2022-09-07 | Disposition: A | Discharge status: Skilled Nursing Facility | Payer: Medicare Other | Attending: Emergency Medicine | Admitting: Emergency Medicine

## 2022-09-07 ENCOUNTER — Emergency Department (HOSPITAL_COMMUNITY): Payer: Medicare Other

## 2022-09-07 DIAGNOSIS — M47816 Spondylosis without myelopathy or radiculopathy, lumbar region: Secondary | ICD-10-CM | POA: Diagnosis not present

## 2022-09-07 DIAGNOSIS — M549 Dorsalgia, unspecified: Secondary | ICD-10-CM | POA: Diagnosis not present

## 2022-09-07 DIAGNOSIS — T68XXXA Hypothermia, initial encounter: Secondary | ICD-10-CM | POA: Diagnosis not present

## 2022-09-07 DIAGNOSIS — M545 Low back pain, unspecified: Secondary | ICD-10-CM | POA: Insufficient documentation

## 2022-09-07 DIAGNOSIS — M5442 Lumbago with sciatica, left side: Secondary | ICD-10-CM | POA: Diagnosis not present

## 2022-09-07 DIAGNOSIS — M5441 Lumbago with sciatica, right side: Secondary | ICD-10-CM | POA: Diagnosis not present

## 2022-09-07 DIAGNOSIS — I10 Essential (primary) hypertension: Secondary | ICD-10-CM | POA: Insufficient documentation

## 2022-09-07 DIAGNOSIS — I959 Hypotension, unspecified: Secondary | ICD-10-CM | POA: Diagnosis not present

## 2022-09-07 MED ORDER — METHOCARBAMOL 500 MG PO TABS: 500.0000 mg | ORAL_TABLET | Freq: Two times a day (BID) | ORAL | 0 refills | Status: DC

## 2022-09-07 MED ORDER — PREDNISONE 20 MG PO TABS
60.0000 mg | ORAL_TABLET | Freq: Once | ORAL | Status: AC
  Administered 2022-09-07: 60 mg via ORAL
  Filled 2022-09-07: qty 3

## 2022-09-07 MED ORDER — PREDNISONE 10 MG PO TABS: 40.0000 mg | ORAL_TABLET | Freq: Every day | ORAL | 0 refills | Status: DC

## 2022-09-07 MED ORDER — HYDROCODONE-ACETAMINOPHEN 5-325 MG PO TABS
1.0000 | ORAL_TABLET | Freq: Once | ORAL | Status: AC
  Administered 2022-09-07: 1 via ORAL
  Filled 2022-09-07: qty 1

## 2022-09-07 MED ORDER — METHOCARBAMOL 500 MG PO TABS
500.0000 mg | ORAL_TABLET | Freq: Once | ORAL | Status: AC
  Administered 2022-09-07: 500 mg via ORAL
  Filled 2022-09-07: qty 1

## 2022-09-07 NOTE — ED Triage Notes (Signed)
Patient BIB EMS from facility due to flare up of chronic back pain. Pain worsened Friday and radiates down her legs. Ambulates with walker, denies fall or injury. Takes OTC Tylenol for pain. JRPRN

## 2022-09-07 NOTE — Discharge Instructions (Addendum)
You have been seen today for your complaint of low back pain. Your imaging showed no acute changes. Your discharge medications include prednisone.  This is a steroid.  You should take it in the morning with breakfast for the next 4 days. Robaxin.  This is a muscle relaxant.  You should only take it as needed until symptoms resolve. Lidocaine patches.  You should place 1 patch on the affected area per day until symptoms resolve. Home care instructions are as follows:  He should only ambulate when someone is nearby to watch you. Follow up with: Your primary care provider in 1 week Please seek immediate medical care if you develop any of the following symptoms: You are not able to control when you urinate or have bowel movements (incontinence). You have: Weakness in your lower back, pelvis, buttocks, or legs that gets worse. Redness or swelling of your back. A burning sensation when you urinate. At this time there does not appear to be the presence of an emergent medical condition, however there is always the potential for conditions to change. Please read and follow the below instructions.  Do not take your medicine if  develop an itchy rash, swelling in your mouth or lips, or difficulty breathing; call 911 and seek immediate emergency medical attention if this occurs.  You may review your lab tests and imaging results in their entirety on your MyChart account.  Please discuss all results of fully with your primary care provider and other specialist at your follow-up visit.  Note: Portions of this text may have been transcribed using voice recognition software. Every effort was made to ensure accuracy; however, inadvertent computerized transcription errors may still be present.

## 2022-09-07 NOTE — ED Provider Notes (Signed)
[facility] DEPT Provider Note   CSN: [id number] Arrival date & time: 09/07/22  1000     History  No chief complaint on file.   Tracey Armstrong is a 87 y.o. female.  With a history of anxiety, GERD, hypertension, depression, chronic low back pain, arthritis who presents to the ED via EMS for evaluation of low back pain.  She states she saw her urologist for ongoing urinary tract infection symptoms on Friday.  She sat down on the toilet which is much lower than her toilet at home and states she started to feel pain at that time.  It was difficult to stand up and she required assistance.  States she then went home and the pain progressively got worse.  She was unable to get out of bed this morning.  States Tylenol has been unhelpful in alleviating her symptoms.  Patient states she typically walks with a rollator and without difficulty.  Denies bowel or bladder incontinence, constipation, fevers, chills saddle paresthesias, numbness or tingling in either extremity.  Pain is worse with forward flexion and twisting.  HPI     Home Medications Prior to Admission medications   Medication Sig Start Date End Date Taking? Authorizing Provider  ALPRAZolam Duanne Moron) 1 MG tablet TAKE ONE-HALF TABLET BY MOUTH AT NOON AND 1 EVERY NIGHT AT BEDTIME. 08/18/22   Martinique, [name], MD  antiseptic oral rinse (BIOTENE) LIQD Use 3 mls 3-4 times per day for dry mouth. Do not swallow. 12/26/20   Martinique, [name], MD  Cholecalciferol (VITAMIN D3) 1.25 MG (50000 UT) CAPS TAKE 1 CAPSULE BY MOUTH EVERY 14 DAYS 06/04/22   Martinique, [name], MD  Dextran 70-Hypromellose (ARTIFICIAL TEARS PF OP) Place 1-2 drops into both eyes See admin instructions. Instill 1-2 drops into both eyes at bedtime and an additional 2-3 times a day as needed for dryness    [provider]  diclofenac (FLECTOR) 1.3 % PTCH Place 1 patch onto the skin 2 (two) times daily. 02/26/22   Martinique, [name], MD   diphenhydramine-acetaminophen (TYLENOL PM) 25-500 MG TABS tablet Take 1.5 tablets by mouth at bedtime.    [provider]  docusate sodium (COLACE) 100 MG capsule Take 1 capsule (100 mg total) by mouth 2 (two) times daily. 06/06/20   Damita Lack, MD  gabapentin (NEURONTIN) 300 MG capsule Take 1 capsule every afternoon 08/13/22   Cameron Sprang, MD  levETIRAcetam (KEPPRA) 500 MG tablet TAKE 1 TABLET(500 MG) BY MOUTH TWICE DAILY 08/13/22   Cameron Sprang, MD  Magnesium 400 MG TABS Take 400 mg by mouth at bedtime.    [provider]  MAXALT-MLT 10 MG disintegrating tablet DISSOLVE ONE TABLET BY MOUTH AS NEEDED HEADACHE. 04/30/21   Cameron Sprang, MD  Melatonin 10 MG TABS Take 10 mg by mouth at bedtime as needed (for sleep).    [provider]  nitrofurantoin, macrocrystal-monohydrate, (MACROBID) 100 MG capsule Take 100 mg by mouth 2 (two) times daily. 08/07/22   [provider]  omeprazole (PRILOSEC OTC) 20 MG tablet Take 20 mg by mouth daily before breakfast.    [provider]  POTASSIUM PO Take 1 tablet by mouth daily with lunch.    [provider]  pyridOXINE (VITAMIN B-6) 100 MG tablet Take 400 mg by mouth daily with lunch.    [provider]  riboflavin (VITAMIN B-2) 100 MG TABS tablet Take 400 mg by mouth daily with lunch.    [provider]  tretinoin (RETIN-A) 0.1 % cream APPLY TO AFFECTED AREA EVERY DAY AT BEDTIME Patient taking differently: Apply 1 application  topically at bedtime. 09/22/18   Martinique, [name], MD  verapamil (VERELAN PM) 180 MG 24 hr capsule TAKE 1 CAPSULE(180 MG) BY MOUTH DAILY 02/07/22   Martinique, [name], MD      Allergies    Dihydrotachysterol, Trazodone and nefazodone, Valium [diazepam], Citalopram, and Other    Review of Systems   Review of Systems  Musculoskeletal:  Positive for back pain.  All other systems reviewed and are negative.   Physical Exam Updated Vital Signs BP (!)  154/78 (BP Location: Right Arm)   Pulse 74   Temp 99.1 F (37.3 C) (Oral)   Resp 16   SpO2 97%  Physical Exam Vitals and nursing note reviewed.  Constitutional:      General: She is not in acute distress.    Appearance: Normal appearance. She is normal weight. She is not ill-appearing.  HENT:     Head: Normocephalic and atraumatic.  Eyes:     Extraocular Movements: Extraocular movements intact.     Pupils: Pupils are equal, round, and reactive to light.  Cardiovascular:     Pulses:          Radial pulses are 2+ on the right side and 2+ on the left side.       Dorsalis pedis pulses are 2+ on the right side and 2+ on the left side.  Pulmonary:     Effort: Pulmonary effort is normal. No respiratory distress.  Abdominal:     General: Abdomen is flat.  Musculoskeletal:        General: No tenderness.     Cervical back: Normal and neck supple.     Thoracic back: Normal.     Lumbar back: Positive right straight leg raise test and positive left straight leg raise test.     Right lower leg: No edema.     Left lower leg: No edema.  Skin:    General: Skin is warm and dry.     Capillary Refill: Capillary refill takes less than 2 seconds.  Neurological:     General: No focal deficit present.     Mental Status: She is alert and oriented to person, place, and time.     Cranial Nerves: No cranial nerve deficit.     Sensory: No sensory deficit.  Psychiatric:        Mood and Affect: Mood normal.        Behavior: Behavior normal.     ED Results / Procedures / Treatments   Labs (all labs ordered are listed, but only abnormal results are displayed) Labs Reviewed - No data to display  EKG None  Radiology DG Lumbar Spine Complete  Result Date: 09/07/2022 CLINICAL DATA:  Pain EXAM: LUMBAR SPINE - COMPLETE 4+ VIEW COMPARISON:  04/09/2021 FINDINGS: Five lumbar type vertebral segments. Chronic severe compression fracture of the L1 vertebral body with retropulsion. Remaining lumbar  vertebral body heights are maintained without evidence of acute fracture. The bones are demineralized. Preservation of the intervertebral disc heights with lower lumbar facet arthropathy. Aortic atherosclerosis. IMPRESSION: Chronic severe compression fracture of the L1 vertebral body. No acute findings. Electronically Signed   By: Davina Poke D.O.   On: 09/07/2022 11:41    Procedures Procedures    Medications Ordered in ED Medications  HYDROcodone-acetaminophen (NORCO/VICODIN) 5-325 MG per tablet 1 tablet (1 tablet Oral Given 09/07/22 1052)  predniSONE (DELTASONE) tablet 60 mg (60 mg Oral Given 09/07/22 1052)  methocarbamol (ROBAXIN) tablet 500 mg (500 mg Oral Given 09/07/22 1307)    ED Course/ Medical Decision Making/ A&P Clinical Course as of 09/07/22 1435  Sun Sep 07, 2022  1220 Patient reports her pain is significantly better.  Will attempt ambulation [AS]  1252 Unable to get up due to pain. Will order robaxin and re try [AS]  4008 Patient ambulated to bathroom and back with walker and without difficulty [AS]  1428 DG Lumbar Spine Complete I personally reviewed the image.  Old fracture, no acute findings [AS]    Clinical Course User Index [AS] Shiheem Corporan, Grafton Folk, PA-C                           Medical Decision Making Amount and/or Complexity of Data Reviewed Radiology: ordered.  Risk Prescription drug management.  This patient presents to the ED for concern of back pain, this involves an extensive number of treatment options, and is a complaint that carries with it a high risk of complications and morbidity.   The emergent differential diagnosis for back pain includes but is not limited to fracture, muscle strain, cauda equina, spinal stenosis. DDD, ankylosing spondylitis, acute ligamentous injury, disk herniation, spondylolisthesis, Epidural compression syndrome, metastatic cancer, transverse myelitis, vertebral osteomyelitis, diskitis, kidney stone, pyelonephritis, AAA,  Perforated ulcer, Retrocecal appendicitis, pancreatitis, bowel obstruction, retroperitoneal hemorrhage or mass, meningitis.    Co morbidities that complicate the patient evaluation   chronic back pain, anxiety, depression, hypertension, GERD, arthritis  My initial workup includes lumbar x-ray, pain control  Additional history obtained from: Nursing notes from this visit. EMS provides a portion of the history  I ordered imaging studies including lumbar x-ray I independently visualized and interpreted imaging which showed chronic severe compression fracture of L1, no acute findings I agree with the radiologist interpretation  Afebrile, hemodynamically stable.  87 year old female presenting to the ED for evaluation of back pain.  Patient does have chronic low back pain, however it was worsened after she sat down on a low toilet.  Lumbar x-ray shows no acute findings.  Physical exam remarkable for positive straight leg raise bilaterally.  Patient was treated with Norco, prednisone and Robaxin.  She was able to ambulate to the bathroom and back without difficulty, but does still have some pain.  We will send patient prescription for prednisone lidocaine patches, and Robaxin.  Encourage patient to only use Robaxin as needed in order to avoid side effects.  Also encourage patient to take max dose of Tylenol on a scheduled basis until symptoms are improved.  Gave patient return precautions.  Stable at discharge.  At this time there does not appear to be any evidence of an acute emergency medical condition and the patient appears stable for discharge with appropriate outpatient follow up. Diagnosis was discussed with patient who verbalizes understanding of care plan and is agreeable to discharge. I have discussed return precautions with patient and son who verbalizes understanding. Patient encouraged to follow-up with their PCP within 1 week. All questions answered.  Patient's case discussed with Dr.  Ashok Cordia who agrees with plan to discharge with follow-up.   Note: Portions of this report may have been transcribed using voice recognition software. Every effort was made to ensure accuracy; however, inadvertent computerized transcription errors may still be present.          Final Clinical Impression(s) / ED Diagnoses Final diagnoses:  None    Rx / DC Orders ED Discharge Orders     None         Nehemiah Massed 09/07/22 1458    Lajean Saver, MD 09/07/22 [phone]

## 2022-09-08 ENCOUNTER — Telehealth: Payer: Self-pay | Admitting: Physical Medicine and Rehabilitation

## 2022-09-08 NOTE — Telephone Encounter (Signed)
IC patient back, was able to reach her. She stated she could not get here that she would have to take an ambulance. She stated she was hoping to just get some strong pain medication.

## 2022-09-08 NOTE — Telephone Encounter (Signed)
Pt called in stating that she is in pain in her lower back... Pt requesting to speak with Dr. Ernestina Patches... Pt stated that she went to ED at [facility] and got xrays done.... Pt stated that they gave her pain meds... Pt stated that she is in so much pain that she feel like she is going to die... Pt requesting callback asap.Marland KitchenMarland Kitchen

## 2022-09-08 NOTE — Telephone Encounter (Signed)
Pt called requesting a call back . Pt states she went to [facility] ED. Pt states her pain are worse and need and pain medication. Pt phone number is [phone].

## 2022-09-09 ENCOUNTER — Other Ambulatory Visit: Payer: Self-pay | Admitting: Physical Medicine and Rehabilitation

## 2022-09-09 MED ORDER — HYDROCODONE-ACETAMINOPHEN 5-325 MG PO TABS: 1.0000 | ORAL_TABLET | Freq: Two times a day (BID) | ORAL | 0 refills | Status: AC | PRN

## 2022-09-09 NOTE — Progress Notes (Signed)
Tracey Armstrong - 87 y.o. female MRN [id number]  Date of birth: [birth year]  Office Visit Note: Visit Date: 08/27/2022 PCP: Martinique, [name], MD Referred by: Lorine Bears, NP  Subjective: Chief Complaint  Patient presents with   Lower Back - Pain   HPI:  Tracey Armstrong is a 87 y.o. female who comes in todayFor planned repeat left L5-S1 interlaminar epidural steroid injection.  Patient's history is quite complicated with prior left femur fracture in June of this year status post IM nailing.  Since I seen her last she has had follow-up with Benita Stabile, P.A.-C with continued pain in the leg and a lot of questions for him based on the interview today and his notes about the fracture itself and it appears to be healing well.  She has known lumbar stenosis severe at L4-5.  She has had compression fracture.  She has had a history of chronic pain.  She follows regularly with her primary care physician Dr. [name].  Last time I saw her was in August and we completed epidural injection with what she refers to as good relief of her back and hip pain.  She has a lot of confusion and perseverates over her hip and her leg and the fracture without a lot of understanding of the spine itself and I do think that is part of the pain pattern is a radicular pain from the severe stenosis at L4-5.  Obviously not a candidate for lumbar decompression at this point.  She has had no focal weakness or new trauma or falls since I saw her.  She does have a history of falls.  She will continue to follow-up with Dr. Ninfa Linden and Benita Stabile.  She can consider medication pain management through referral from her primary care physician if needed.   ROS Otherwise per HPI.  Assessment & Plan: Visit Diagnoses:    ICD-10-CM   1. Lumbar radiculopathy  M54.16 XR C-ARM NO REPORT    Epidural Steroid injection    methylPREDNISolone acetate (DEPO-MEDROL) injection 40 mg      Plan: No additional findings.   Meds & Orders:  Meds  ordered this encounter  Medications   methylPREDNISolone acetate (DEPO-MEDROL) injection 40 mg    Orders Placed This Encounter  Procedures   XR C-ARM NO REPORT   Epidural Steroid injection    Follow-up: Return for visit to requesting provider as needed.   Procedures: No procedures performed  Lumbar Epidural Steroid Injection - Interlaminar Approach with Fluoroscopic Guidance  Patient: Tracey Armstrong      Date of Birth: [birth year] MRN: [id number] PCP: Martinique, [name], MD      Visit Date: 08/27/2022   Universal Protocol:     Consent Given By: the patient  Position: PRONE  Additional Comments: Vital signs were monitored before and after the procedure. Patient was prepped and draped in the usual sterile fashion. The correct patient, procedure, and site was verified.   Injection Procedure Details:   Procedure diagnoses: Lumbar radiculopathy [M54.16]   Meds Administered:  Meds ordered this encounter  Medications   methylPREDNISolone acetate (DEPO-MEDROL) injection 40 mg     Laterality: Left  Location/Site:  L5-S1  Needle: 3.5 in., 20 ga. Tuohy  Needle Placement: Paramedian epidural  Findings:   -Comments: Excellent flow of contrast into the epidural space.  Procedure Details: Using a paramedian approach from the side mentioned above, the region overlying the inferior lamina was localized under fluoroscopic visualization and the soft tissues overlying this  structure were infiltrated with 4 ml. of 1% Lidocaine without Epinephrine. The Tuohy needle was inserted into the epidural space using a paramedian approach.   The epidural space was localized using loss of resistance along with counter oblique bi-planar fluoroscopic views.  After negative aspirate for air, blood, and CSF, a 2 ml. volume of Isovue-250 was injected into the epidural space and the flow of contrast was observed. Radiographs were obtained for documentation purposes.    The injectate was administered into  the level noted above.   Additional Comments:  The patient tolerated the procedure well Dressing: 2 x 2 sterile gauze and Band-Aid    Post-procedure details: Patient was observed during the procedure. Post-procedure instructions were reviewed.  Patient left the clinic in stable condition.   Clinical History: CT LUMBAR SPINE WITHOUT CONTRAST   TECHNIQUE:  Multidetector CT imaging of the lumbar spine was performed without  intravenous contrast administration. Multiplanar CT image  reconstructions were also generated.   COMPARISON:  Lumbar spine radiographs 04/04/2020 and MRI 09/09/2018   FINDINGS:  Segmentation: 5 lumbar type vertebrae.   Alignment: Mild thoracolumbar levoscoliosis. Grade 1 anterolisthesis  of L4 on L5, unchanged from the prior MRI.   Vertebrae: L1 superior endplate compression fracture with 30%  vertebral body height loss and 4 mm retropulsion of the superior  endplate. A fracture line remains visible although there is  surrounding sclerosis. Hemangioma in the L4 vertebral body.   Paraspinal and other soft tissues: Aortic atherosclerosis without  aneurysm.   Disc levels: Disc bulging and posterior element hypertrophy result  in chronic mild-to-moderate spinal stenosis at L3-4 and moderate to  severe spinal stenosis at L4-5. No significant spinal stenosis  related to mild L1 retropulsion.   IMPRESSION:  1. Possibly subacute L1 compression fracture with 30% height loss  and 4 mm retropulsion. No associated spinal stenosis.  2. Chronic mild-to-moderate spinal stenosis at L3-4 and moderate to  severe spinal stenosis at L4-5.  3. Aortic Atherosclerosis (ICD10-I70.0).    Electronically Signed    By: Logan Bores M.D.    On: 04/04/2020 17:39     Objective:  VS:  HT:    WT:   BMI:     BP:122/76  HR:80bpm  TEMP: ( )  RESP:  Physical Exam Vitals and nursing note reviewed.  Constitutional:      General: She is not in acute distress.     Appearance: Normal appearance. She is not ill-appearing.  HENT:     Head: Normocephalic and atraumatic.     Right Ear: External ear normal.     Left Ear: External ear normal.  Eyes:     Extraocular Movements: Extraocular movements intact.  Cardiovascular:     Rate and Rhythm: Normal rate.     Pulses: Normal pulses.  Pulmonary:     Effort: Pulmonary effort is normal. No respiratory distress.  Abdominal:     General: There is no distension.     Palpations: Abdomen is soft.  Musculoskeletal:        General: Tenderness present.     Cervical back: Neck supple.     Right lower leg: No edema.     Left lower leg: No edema.     Comments: Patient uses a rolling walker.  She is slow to rise from seated position with pain of the lower spine with extension of the spine.  No pain with hip rotation although she is stiff on the left.  She is tender really everywhere across  the low back and left trochanter area.  This is tenderness without deep palpation.  She has good distal strength.  Skin:    Findings: No erythema, lesion or rash.  Neurological:     General: No focal deficit present.     Mental Status: She is alert and oriented to person, place, and time.     Sensory: No sensory deficit.     Motor: No weakness or abnormal muscle tone.     Coordination: Coordination normal.     Gait: Gait abnormal.  Psychiatric:        Mood and Affect: Mood normal.        Behavior: Behavior normal.     Comments: Some perseveration over current conditions causing her pain.      Imaging: No results found.

## 2022-09-09 NOTE — Procedures (Signed)
Lumbar Epidural Steroid Injection - Interlaminar Approach with Fluoroscopic Guidance  Patient: Tracey Armstrong      Date of Birth: 05-Sep-1935 MRN: [id number] PCP: Martinique, [name], MD      Visit Date: 08/27/2022   Universal Protocol:     Consent Given By: the patient  Position: PRONE  Additional Comments: Vital signs were monitored before and after the procedure. Patient was prepped and draped in the usual sterile fashion. The correct patient, procedure, and site was verified.   Injection Procedure Details:   Procedure diagnoses: Lumbar radiculopathy [M54.16]   Meds Administered:  Meds ordered this encounter  Medications   methylPREDNISolone acetate (DEPO-MEDROL) injection 40 mg     Laterality: Left  Location/Site:  L5-S1  Needle: 3.5 in., 20 ga. Tuohy  Needle Placement: Paramedian epidural  Findings:   -Comments: Excellent flow of contrast into the epidural space.  Procedure Details: Using a paramedian approach from the side mentioned above, the region overlying the inferior lamina was localized under fluoroscopic visualization and the soft tissues overlying this structure were infiltrated with 4 ml. of 1% Lidocaine without Epinephrine. The Tuohy needle was inserted into the epidural space using a paramedian approach.   The epidural space was localized using loss of resistance along with counter oblique bi-planar fluoroscopic views.  After negative aspirate for air, blood, and CSF, a 2 ml. volume of Isovue-250 was injected into the epidural space and the flow of contrast was observed. Radiographs were obtained for documentation purposes.    The injectate was administered into the level noted above.   Additional Comments:  The patient tolerated the procedure well Dressing: 2 x 2 sterile gauze and Band-Aid    Post-procedure details: Patient was observed during the procedure. Post-procedure instructions were reviewed.  Patient left the clinic in stable condition.

## 2022-09-09 NOTE — Telephone Encounter (Signed)
IC LMVM for patient

## 2022-09-09 NOTE — Progress Notes (Unsigned)
Spoke with patient via telephone, reports continued lower back pain, was recently seen in ED for same. I advised patient she would need to be seen in our office for evaluation, states she will need to take an ambulance to our office. If she is not able to get to our office and her pain remains severe she can return to ED. I did write short course of Norco for her, if she requires long term chronic pain management I advised her to contact primary care provider.

## 2022-09-10 ENCOUNTER — Telehealth: Payer: Self-pay | Admitting: Physical Medicine and Rehabilitation

## 2022-09-10 NOTE — Telephone Encounter (Signed)
Mikeal Hawthorne (pharmacist) called from [facility] called requesting a call back from [facility] for clarification on a script sent in yesterday. Mikeal Hawthorne phone number is [phone].

## 2022-09-15 ENCOUNTER — Telehealth: Payer: Self-pay

## 2022-09-15 NOTE — Telephone Encounter (Signed)
Pt called into the office stating she needs a refill on ALPRAZolam (XANAX) 1 MG tablet . She stated she gets 45 pills a month and she takes 1/2 pill at noon and a whole one at night.  [place], [place] AT [place]

## 2022-09-15 NOTE — Telephone Encounter (Signed)
Rx was phoned in on 10/30 with 3 refills, pharmacy will fill Rx when ready to be filled.

## 2022-09-16 ENCOUNTER — Telehealth: Payer: Self-pay

## 2022-09-16 NOTE — Telephone Encounter (Signed)
        Patient  visited [place] on 1/18    Telephone encounter attempt :  1st  A HIPAA compliant voice message was left requesting a return call.  Instructed patient to call back     [place] [employer]  [phone] [address], [place], [place] [address] Phone: [phone] Email: [email or web address]

## 2022-09-18 ENCOUNTER — Encounter (HOSPITAL_COMMUNITY): Payer: Self-pay | Admitting: Emergency Medicine

## 2022-09-18 ENCOUNTER — Emergency Department (HOSPITAL_COMMUNITY)
Admission: EM | Admit: 2022-09-18 | Discharge: 2022-09-19 | Disposition: A | Discharge status: Home / Self Care | Payer: Medicare Other | Attending: Emergency Medicine | Admitting: Emergency Medicine

## 2022-09-18 DIAGNOSIS — I10 Essential (primary) hypertension: Secondary | ICD-10-CM | POA: Insufficient documentation

## 2022-09-18 DIAGNOSIS — Z79899 Other long term (current) drug therapy: Secondary | ICD-10-CM | POA: Diagnosis not present

## 2022-09-18 DIAGNOSIS — M545 Low back pain, unspecified: Secondary | ICD-10-CM | POA: Diagnosis not present

## 2022-09-18 DIAGNOSIS — G8929 Other chronic pain: Secondary | ICD-10-CM | POA: Insufficient documentation

## 2022-09-18 DIAGNOSIS — M5459 Other low back pain: Secondary | ICD-10-CM | POA: Diagnosis not present

## 2022-09-18 DIAGNOSIS — M4316 Spondylolisthesis, lumbar region: Secondary | ICD-10-CM | POA: Diagnosis not present

## 2022-09-18 DIAGNOSIS — M48061 Spinal stenosis, lumbar region without neurogenic claudication: Secondary | ICD-10-CM | POA: Diagnosis not present

## 2022-09-18 DIAGNOSIS — M549 Dorsalgia, unspecified: Secondary | ICD-10-CM | POA: Diagnosis not present

## 2022-09-18 NOTE — ED Triage Notes (Signed)
Pt from home- complaints of sciatica bilaterally. States she was at [facility] 2 weeks for same thing. She states she can't get out of bed. Per PTAR she walked with walker. Pt is able to get up and go to the bathroom.

## 2022-09-19 ENCOUNTER — Emergency Department (HOSPITAL_COMMUNITY): Payer: Medicare Other

## 2022-09-19 ENCOUNTER — Encounter (HOSPITAL_COMMUNITY): Payer: Self-pay | Admitting: Emergency Medicine

## 2022-09-19 DIAGNOSIS — M4316 Spondylolisthesis, lumbar region: Secondary | ICD-10-CM | POA: Diagnosis not present

## 2022-09-19 DIAGNOSIS — M48061 Spinal stenosis, lumbar region without neurogenic claudication: Secondary | ICD-10-CM | POA: Diagnosis not present

## 2022-09-19 DIAGNOSIS — M545 Low back pain, unspecified: Secondary | ICD-10-CM | POA: Diagnosis not present

## 2022-09-19 LAB — URINALYSIS, ROUTINE W REFLEX MICROSCOPIC
Bacteria, UA: NONE SEEN
Bilirubin Urine: NEGATIVE
Glucose, UA: NEGATIVE mg/dL
Hgb urine dipstick: NEGATIVE
Ketones, ur: NEGATIVE mg/dL
Leukocytes,Ua: NEGATIVE
Nitrite: NEGATIVE
Protein, ur: NEGATIVE mg/dL
Specific Gravity, Urine: 1.009 (ref 1.005–1.030)
pH: 6 (ref 5.0–8.0)

## 2022-09-19 MED ORDER — OXYCODONE-ACETAMINOPHEN 5-325 MG PO TABS
1.0000 | ORAL_TABLET | Freq: Once | ORAL | Status: AC
  Administered 2022-09-19: 1 via ORAL
  Filled 2022-09-19: qty 1

## 2022-09-19 MED ORDER — HYDROCODONE-ACETAMINOPHEN 5-325 MG PO TABS: 1.0000 | ORAL_TABLET | ORAL | 0 refills | Status: DC | PRN

## 2022-09-19 NOTE — ED Notes (Signed)
Pt placed in recliner in lobby next to nurse first desk. Son with her.

## 2022-09-19 NOTE — ED Provider Triage Note (Signed)
  Emergency Medicine Provider Triage Evaluation Note  MRN:  [id number]  Arrival date & time: 09/19/22    Medically screening exam initiated at 12:05 AM.   CC:   Hip Pain   HPI:  Tracey Armstrong is a 87 y.o. year-old female presents to the ED with chief complaint of low back pain that radiates to bilateral legs.  Hx of the same.  Taking prednisone.  Denies incontinence.  States that she can't walk due to pain.  History provided by patient. ROS:  -As included in HPI PE:   Vitals:   09/18/22 2346  BP: (!) 175/93  Pulse: (!) 106  Resp: 18  Temp: 98.7 F (37.1 C)  SpO2: 96%    Non-toxic appearing No respiratory distress  MDM:  Based on signs and symptoms, acute on chronic pain is highest on my differential, followed by worsening compression fracture. I've ordered CT in triage to expedite lab/diagnostic workup.  Patient was informed that the remainder of the evaluation will be completed by another provider, this initial triage assessment does not replace that evaluation, and the importance of remaining in the ED until their evaluation is complete.    Montine Circle, PA-C 09/19/22 0007

## 2022-09-19 NOTE — Discharge Instructions (Addendum)
Tracey Armstrong was seen in the ER today for her low back pain.  Her CT scan  showed her previously noted L1 vertebral fracture without any new changes today.  Please follow-up with her spine specialist in the outpatient setting and return to the ER with any severe symptoms.  PT prescription has been placed, and the anticipate a phone call from that team in the coming days.

## 2022-09-19 NOTE — ED Provider Notes (Signed)
[facility] EMERGENCY DEPARTMENT Provider Note   CSN: [id number] Arrival date & time: 09/18/22  2342     History  Chief Complaint  Patient presents with   Hip Pain    Tracey Armstrong is a 87 y.o. female with well-known chronic back pain who presents with concern for persistent exacerbation of this pain for the last couple of weeks.  Patient states that she was at her urologist and when she sat down on the low toilet to give a urine sample she jolted her back and has had worsening pain since that time.  Is currently on nitrofurantoin for urinary tract infection but denies any fevers chills nausea vomiting or new flank pain.  Pain is in her low back primarily in the center, intermittent radiation down the legs but none of those symptoms today.  Patient stating that she cannot even get out of bed, however also reported to this provider that she ambulates around her apartment at [facility] with her rollator without difficulty.  She states that her pain is primarily when she is changing positions from lying to sitting or sitting to standing but once she is up she is able to move more comfortably.  Additionally she states that she has not had physical therapy for the last 3 weeks as reportedly her PCPs prescription ran out and the facility would no longer perform this.  Her son is requesting a new PT prescription today.  HPI     Home Medications Prior to Admission medications   Medication Sig Start Date End Date Taking? Authorizing Provider  HYDROcodone-acetaminophen (NORCO/VICODIN) 5-325 MG tablet Take 1 tablet by mouth every 4 (four) hours as needed. 09/19/22  Yes Tracee Mccreery, Gypsy Balsam, PA-C  ALPRAZolam (XANAX) 1 MG tablet TAKE ONE-HALF TABLET BY MOUTH AT NOON AND 1 EVERY NIGHT AT BEDTIME. 08/18/22   Martinique, [name], MD  antiseptic oral rinse (BIOTENE) LIQD Use 3 mls 3-4 times per day for dry mouth. Do not swallow. 12/26/20   Martinique, [name], MD  Cholecalciferol (VITAMIN D3)  1.25 MG (50000 UT) CAPS TAKE 1 CAPSULE BY MOUTH EVERY 14 DAYS 06/04/22   Martinique, [name], MD  Dextran 70-Hypromellose (ARTIFICIAL TEARS PF OP) Place 1-2 drops into both eyes See admin instructions. Instill 1-2 drops into both eyes at bedtime and an additional 2-3 times a day as needed for dryness    [provider]  diclofenac (FLECTOR) 1.3 % PTCH Place 1 patch onto the skin 2 (two) times daily. 02/26/22   Martinique, [name], MD  diphenhydramine-acetaminophen (TYLENOL PM) 25-500 MG TABS tablet Take 1.5 tablets by mouth at bedtime.    [provider]  docusate sodium (COLACE) 100 MG capsule Take 1 capsule (100 mg total) by mouth 2 (two) times daily. 06/06/20   Damita Lack, MD  gabapentin (NEURONTIN) 300 MG capsule Take 1 capsule every afternoon 08/13/22   Cameron Sprang, MD  levETIRAcetam (KEPPRA) 500 MG tablet TAKE 1 TABLET(500 MG) BY MOUTH TWICE DAILY 08/13/22   Cameron Sprang, MD  Magnesium 400 MG TABS Take 400 mg by mouth at bedtime.    [provider]  MAXALT-MLT 10 MG disintegrating tablet DISSOLVE ONE TABLET BY MOUTH AS NEEDED HEADACHE. 04/30/21   Cameron Sprang, MD  Melatonin 10 MG TABS Take 10 mg by mouth at bedtime as needed (for sleep).    [provider]  methocarbamol (ROBAXIN) 500 MG tablet Take 1 tablet (500 mg total) by mouth 2 (two) times  daily. 09/07/22   Schutt, Grafton Folk, PA-C  nitrofurantoin, macrocrystal-monohydrate, (MACROBID) 100 MG capsule Take 100 mg by mouth 2 (two) times daily. 08/07/22   [provider]  omeprazole (PRILOSEC OTC) 20 MG tablet Take 20 mg by mouth daily before breakfast.    [provider]  POTASSIUM PO Take 1 tablet by mouth daily with lunch.    [provider]  predniSONE (DELTASONE) 10 MG tablet Take 4 tablets (40 mg total) by mouth daily. 09/07/22   Schutt, Grafton Folk, PA-C  pyridOXINE (VITAMIN B-6) 100 MG tablet Take 400 mg by mouth daily with lunch.    [provider]   riboflavin (VITAMIN B-2) 100 MG TABS tablet Take 400 mg by mouth daily with lunch.    [provider]  tretinoin (RETIN-A) 0.1 % cream APPLY TO AFFECTED AREA EVERY DAY AT BEDTIME Patient taking differently: Apply 1 application  topically at bedtime. 09/22/18   Martinique, [name], MD  verapamil (VERELAN PM) 180 MG 24 hr capsule TAKE 1 CAPSULE(180 MG) BY MOUTH DAILY 02/07/22   Martinique, [name], MD      Allergies    Dihydrotachysterol, Trazodone and nefazodone, Valium [diazepam], Citalopram, and Other    Review of Systems   Review of Systems  Genitourinary: Negative.   Musculoskeletal:  Positive for back pain.    Physical Exam Updated Vital Signs BP (!) 133/58 (BP Location: Left Arm)   Pulse 85   Temp 98 F (36.7 C) (Oral)   Resp 16   SpO2 100%  Physical Exam Vitals and nursing note reviewed.  Constitutional:      Appearance: She is not ill-appearing or toxic-appearing.  HENT:     Head: Normocephalic and atraumatic.     Mouth/Throat:     Mouth: Mucous membranes are moist.     Pharynx: No oropharyngeal exudate or posterior oropharyngeal erythema.  Eyes:     General:        Right eye: No discharge.        Left eye: No discharge.     Conjunctiva/sclera: Conjunctivae normal.  Cardiovascular:     Rate and Rhythm: Normal rate and regular rhythm.     Pulses: Normal pulses.     Heart sounds: Normal heart sounds. No murmur heard. Pulmonary:     Effort: Pulmonary effort is normal. No respiratory distress.     Breath sounds: Normal breath sounds. No wheezing or rales.  Abdominal:     General: Bowel sounds are normal. There is no distension.     Palpations: Abdomen is soft.     Tenderness: There is no abdominal tenderness. There is no right CVA tenderness, left CVA tenderness, guarding or rebound.  Musculoskeletal:        General: No deformity.     Cervical back: Normal and neck supple.     Thoracic back: Normal.     Lumbar back: Spasms, tenderness and bony tenderness  present.     Right lower leg: No edema.     Left lower leg: No edema.  Skin:    General: Skin is warm and dry.     Capillary Refill: Capillary refill takes less than 2 seconds.  Neurological:     General: No focal deficit present.     Mental Status: She is alert and oriented to person, place, and time. Mental status is at baseline.     Sensory: Sensation is intact.     Motor: Motor function is intact.     Gait: Gait  is intact.     Comments: Ambulatory with walker in ED  Psychiatric:        Mood and Affect: Mood normal.     ED Results / Procedures / Treatments   Labs (all labs ordered are listed, but only abnormal results are displayed) Labs Reviewed  URINALYSIS, ROUTINE W REFLEX MICROSCOPIC - Abnormal; Notable for the following components:      Result Value   APPearance HAZY (*)    All other components within normal limits  URINE CULTURE    EKG None  Radiology CT Lumbar Spine Wo Contrast  Result Date: 09/19/2022 CLINICAL DATA:  Lumbar compression fracture EXAM: CT LUMBAR SPINE WITHOUT CONTRAST TECHNIQUE: Multidetector CT imaging of the lumbar spine was performed without intravenous contrast administration. Multiplanar CT image reconstructions were also generated. RADIATION DOSE REDUCTION: This exam was performed according to the departmental dose-optimization program which includes automated exposure control, adjustment of the mA and/or kV according to patient size and/or use of iterative reconstruction technique. COMPARISON:  04/04/2020 lumbar spine CT 09/07/2022 lumbar spine radiographs FINDINGS: Segmentation: Standard Alignment: Unchanged grade 1 anterolisthesis at L4-5 Vertebrae: Progression of height loss at L1 vertebral fracture now with greater than 75% central height loss. There is 5 mm retropulsion of the posterosuperior corner. The acuity of the advanced height loss is unclear. Paraspinal and other soft tissues: Calcific aortic atherosclerosis. No perivertebral hematoma.  Disc levels: Mild spinal canal stenosis at the L1 level, due to above described retropulsion. There is mild spinal canal stenosis at L4-5, unchanged IMPRESSION: 1. Progression of height loss at L1 vertebral fracture now with greater than 75% central height loss and 5 mm retropulsion of the posterosuperior corner. The acuity of the advanced height loss is unclear, but is unchanged since 09/07/2022. 2. Unchanged mild spinal canal stenosis at L4-5. Aortic Atherosclerosis (ICD10-I70.0). Electronically Signed   By: Ulyses Jarred M.D.   On: 09/19/2022 02:46    Procedures Procedures    Medications Ordered in ED Medications  oxyCODONE-acetaminophen (PERCOCET/ROXICET) 5-325 MG per tablet 1 tablet (1 tablet Oral Given 09/19/22 0016)  oxyCODONE-acetaminophen (PERCOCET/ROXICET) 5-325 MG per tablet 1 tablet (1 tablet Oral Given 09/19/22 2094)    ED Course/ Medical Decision Making/ A&P                           Medical Decision Making 87 year old female who presents with concern for chronic low back pain.   HTN on intake, crying. Calm at time of my eval. Midline spinous TTP without redness, erythema, induration, or skin changes. No trigger points. Patient without hx of IVDU, recent trauma, recent instrumentation or injection into the spine.  Patient is neurovascular intact on physical exam, denies saddle anesthesia.  The emergent differential diagnosis for low back pain includes acute ligamentous and muscular injury, cord compression syndrome, pathologic fracture, transverse myelitis, vertebral osteomyelitis, discitis, and epidural abscess.   Amount and/or Complexity of Data Reviewed Labs: ordered.    Details: UA unremarkable, urine culture pending.  Radiology: independent interpretation performed.    Details: CT visualized this provider with chronic L1 fracture with height loss without acute change from previous CTs.  Risk Prescription drug management.   Clinical picture most consistent with  patient's known chronic low back pain.  No red flag signs or symptoms, patient is neurologically intact and ambulatory today.  Will renew prescription for PT at her facility, recommend Lidoderm patches in addition to her treatment regimen at home.  No  further comport in the ER at this time.  Clinical concern for emergent underlying etiology that warrant further ED workup or inpatient management is exceedingly low.  Armonie and her son voiced understanding of her medical evaluation and treatment plan. Each of their questions answered to their expressed satisfaction.  Return precautions were given.  Patient is well-appearing, stable, and was discharged in good condition.  This chart was dictated using voice recognition software, Dragon. Despite the best efforts of this provider to proofread and correct errors, errors may still occur which can change documentation meaning.  Final Clinical Impression(s) / ED Diagnoses Final diagnoses:  Chronic bilateral low back pain without sciatica    Rx / DC Orders ED Discharge Orders          [place]        09/19/22 0455    Face-to-face encounter (required for Medicare/Medicaid patients)       Comments: [facility] certify that this patient is under my care and that I, or a nurse practitioner or physician's assistant working with me, had a face-to-face encounter that meets the physician face-to-face encounter requirements with this patient on 09/19/2022. The encounter with the patient was in whole, or in part for the following medical condition(s) which is the primary reason for home health care (List medical condition): lumbar compression fx with pain inhibiting mobility   09/19/22 0455    HYDROcodone-acetaminophen (NORCO/VICODIN) 5-325 MG tablet  Every 4 hours PRN        09/19/22 0525              Kasmira Cacioppo, Gypsy Balsam, PA-C 09/19/22 0537    Ripley Fraise, MD 09/19/22 [phone]

## 2022-09-20 LAB — URINE CULTURE: Culture: NO GROWTH

## 2022-09-22 ENCOUNTER — Other Ambulatory Visit: Payer: Self-pay | Admitting: Physical Medicine and Rehabilitation

## 2022-09-23 ENCOUNTER — Telehealth: Payer: Self-pay | Admitting: Physical Medicine and Rehabilitation

## 2022-09-23 NOTE — Telephone Encounter (Signed)
Spoke with patient and scheduled ov for 09/25/22

## 2022-09-23 NOTE — Telephone Encounter (Signed)
Patient called advised she need to schedule an appointment with Dr. Ernestina Patches for her back. Patient said she went to the ER around 09/14/2022 because she could not get out of bed. The number to contact patient is [phone]

## 2022-09-25 ENCOUNTER — Ambulatory Visit (INDEPENDENT_AMBULATORY_CARE_PROVIDER_SITE_OTHER): Payer: Medicare Other | Admitting: Physical Medicine and Rehabilitation

## 2022-09-25 ENCOUNTER — Encounter: Payer: Self-pay | Admitting: Physical Medicine and Rehabilitation

## 2022-09-25 DIAGNOSIS — M48062 Spinal stenosis, lumbar region with neurogenic claudication: Secondary | ICD-10-CM

## 2022-09-25 DIAGNOSIS — S32010S Wedge compression fracture of first lumbar vertebra, sequela: Secondary | ICD-10-CM

## 2022-09-25 DIAGNOSIS — R269 Unspecified abnormalities of gait and mobility: Secondary | ICD-10-CM

## 2022-09-25 DIAGNOSIS — M5416 Radiculopathy, lumbar region: Secondary | ICD-10-CM | POA: Diagnosis not present

## 2022-09-25 MED ORDER — HYDROCODONE-ACETAMINOPHEN 5-325 MG PO TABS: 1.0000 | ORAL_TABLET | Freq: Two times a day (BID) | ORAL | 0 refills | Status: DC | PRN

## 2022-09-25 NOTE — Progress Notes (Signed)
Numeric Pain Rating Scale and Functional Assessment Average Pain 10   In the last MONTH (on 0-10 scale) has pain interfered with the following?  1. General activity like being  able to carry out your everyday physical activities such as walking, climbing stairs, carrying groceries, or moving a chair?  Rating(10)   Lower back pain that she says is all over. Pain is keeping her up at night

## 2022-09-25 NOTE — Progress Notes (Signed)
Tracey Armstrong - 87 y.o. female MRN [id number]  Date of birth: [birth year]  Office Visit Note: Visit Date: 09/25/2022 PCP: Martinique, [name], MD Referred by: Martinique, [name], MD  Subjective: Chief Complaint  Patient presents with   Lower Back - Pain   HPI: Tracey Armstrong is a 87 y.o. female who comes in today for evaluation of chronic, worsening and severe bilateral lower back pain radiating to hips and buttocks. Patient is somewhat of poor historian, her son is accompanying her during our visit today. Pain ongoing for several years. Pain worsened several weeks ago after sitting on low toilet in nephrologist office. Recently evaluated in emergency department for increased back pain. States her pain worsens with standing, walking and activity. She describes her pain as sharp and sore, currently rates as 9 out of 10. Patient currently resides at [facility] at [facility] Some relief of pain with home exercise regimen, rest and use of medications. Good relief with Norco as needed for moderate/severe pain. Does attend physical therapy treatments at facility, usually twice a week, some relief of pain with these treatments. CT imaging of lumbar spine from 2021 exhibits moderate to severe spinal canal stenosis at L4-L5 and L1 superior endplate compression fracture with 30% vertebral body height. More recent CT imaging of lumbar spine completed in the emergency department states unchanged mild spinal canal stenosis at L4-L5 and progression of height loss at L1 vertebral fracture now with greater than 75% central height loss and 5 mm retropulsion of the posterosuperior corner. Patient underwent left L5-S1 interlaminar epidural steroid injection in our office on 08/27/2022, states she is unsure if this injection helped to alleviate her pain. History of left femur fracture from fall several years ago, underwent IM nailing with Dr. Rod Can in August of 2021, she reports chronic pain to left hip/leg that she does  associate with injury/surgery. She continues to follow up with Erskine Emery, PA and Dr. Jean Rosenthal for left hip/leg issues. Patient ambulates with rolling walker. Patient denies focal weakness, numbness and tingling. Patient denies recent trauma or falls.     Review of Systems  Musculoskeletal:  Positive for back pain.  Neurological:  Negative for tingling, sensory change, focal weakness and weakness.  All other systems reviewed and are negative.  Otherwise per HPI.  Assessment & Plan: Visit Diagnoses:    ICD-10-CM   1. Lumbar radiculopathy  M54.16 Ambulatory referral to Physical Medicine Rehab    2. Spinal stenosis of lumbar region with neurogenic claudication  M48.062 Ambulatory referral to Physical Medicine Rehab    3. Closed wedge compression fracture of L1 vertebra, sequela  S32.010S Ambulatory referral to Physical Medicine Rehab    4. Gait abnormality  R26.9        Plan: Findings:  1. Chronic, worsening and severe bilateral lower back pain radiating to hips and buttocks. Patient continues to have severe pain despite good conservative therapies such as formal physical therapy, home exercise regimen, rest and use of medications. Patients clinical presentation and exam are consistent with neurogenic claudication as a result of spinal canal stenosis. There is moderate to severe spinal canal stenosis at the level of L4-L5 on CT imaging of lumbar spine from 2021, more recent CT imaging of lumbar spine does report increased progression of disc height loss at L1 vertebral fracture and mild unchanged spinal canal stenosis at L4-L5. After reviewing imaging we feel stenosis at the level of L4-L5 is more moderate to severe. We can't completely exclude that her  pain could be a result of chronic L1 compression fracture, however this presentation would seem to illicite pain to more upper lumbar region. Next step is to perform bilateral L4 transforaminal epidural steroid injection under  fluoroscopic guidance. If good relief of pain with injection we can repeat this procedure infrequently as needed. I also wrote paper order for continued physical therapy at [facility], patient instructed to take this directly to physical therapist to set up treatments. No red flag symptoms noted upon exam today.   2. I did discuss medication management with patient today. I explained to her that we do not treat chronic pain with long term opioid mediation. I do feel she would benefit from chronic pain management whether this is done with her primary care provider Dr. [name] or with a chronic pain management specialist. I did provide patient with short course of Norco until we are able to figure out who will be taking over her chronic pain management.      Meds & Orders:  Meds ordered this encounter  Medications   HYDROcodone-acetaminophen (NORCO/VICODIN) 5-325 MG tablet    Sig: Take 1 tablet by mouth every 12 (twelve) hours as needed for moderate pain or severe pain.    Dispense:  21 tablet    Refill:  0    Orders Placed This Encounter  Procedures   Ambulatory referral to Physical Medicine Rehab    Follow-up: Return for Bilateral L4 transforaminal epidural steroid injection.   Procedures: No procedures performed      Clinical History: CT LUMBAR SPINE WITHOUT CONTRAST   TECHNIQUE:  Multidetector CT imaging of the lumbar spine was performed without  intravenous contrast administration. Multiplanar CT image  reconstructions were also generated.   COMPARISON:  Lumbar spine radiographs 04/04/2020 and MRI 09/09/2018   FINDINGS:  Segmentation: 5 lumbar type vertebrae.   Alignment: Mild thoracolumbar levoscoliosis. Grade 1 anterolisthesis  of L4 on L5, unchanged from the prior MRI.   Vertebrae: L1 superior endplate compression fracture with 30%  vertebral body height loss and 4 mm retropulsion of the superior  endplate. A fracture line remains visible although there is   surrounding sclerosis. Hemangioma in the L4 vertebral body.   Paraspinal and other soft tissues: Aortic atherosclerosis without  aneurysm.   Disc levels: Disc bulging and posterior element hypertrophy result  in chronic mild-to-moderate spinal stenosis at L3-4 and moderate to  severe spinal stenosis at L4-5. No significant spinal stenosis  related to mild L1 retropulsion.   IMPRESSION:  1. Possibly subacute L1 compression fracture with 30% height loss  and 4 mm retropulsion. No associated spinal stenosis.  2. Chronic mild-to-moderate spinal stenosis at L3-4 and moderate to  severe spinal stenosis at L4-5.  3. Aortic Atherosclerosis (ICD10-I70.0).    Electronically Signed    By: Logan Bores M.D.    On: 04/04/2020 17:39   She reports that she has never smoked. She has never used smokeless tobacco. No results for input(s): "HGBA1C", "LABURIC" in the last 8760 hours.  Objective:  VS:  HT:    WT:   BMI:     BP:   HR: bpm  TEMP: ( )  RESP:  Physical Exam Vitals and nursing note reviewed.  HENT:     Head: Normocephalic and atraumatic.     Right Ear: External ear normal.     Left Ear: External ear normal.     Nose: Nose normal.     Mouth/Throat:     Mouth: Mucous membranes are  moist.  Eyes:     Extraocular Movements: Extraocular movements intact.  Cardiovascular:     Rate and Rhythm: Normal rate.     Pulses: Normal pulses.  Pulmonary:     Effort: Pulmonary effort is normal.  Abdominal:     General: Abdomen is flat. There is no distension.  Musculoskeletal:        General: Tenderness present.     Cervical back: Normal range of motion.     Comments: Pt rises from seated position to standing without difficulty. Good lumbar range of motion. Strong distal strength without clonus, no pain upon palpation of greater trochanters. Sensation intact bilaterally. Ambulates with rolling walker, gait slow and unsteady.   Skin:    General: Skin is warm and dry.     Capillary Refill:  Capillary refill takes less than 2 seconds.  Neurological:     Mental Status: She is alert and oriented to person, place, and time.     Gait: Gait abnormal.  Psychiatric:        Mood and Affect: Mood normal.        Behavior: Behavior normal.     Ortho Exam  Imaging: No results found.  Past Medical/Family/Surgical/Social History: Medications & Allergies reviewed per EMR, new medications updated. Patient Active Problem List   Diagnosis Date Noted   Unilateral primary osteoarthritis, left knee 04/14/2022   Myalgia due to statin 02/26/2022   Atherosclerosis of aorta ([place]) 07/25/2021   Urinary tract infection due to ESBL Klebsiella 67/34/1937   Acute metabolic encephalopathy 90/24/0973   Hypertensive urgency 04/09/2021   Lactic acidosis 04/09/2021   Lab test positive for detection of COVID-19 virus 04/09/2021   Abnormal urinalysis 04/09/2021   Lumbar radiculopathy 02/06/2021   Pes anserinus bursitis of left knee 09/03/2020   Trochanteric bursitis of left hip 09/03/2020   Closed intertrochanteric fracture of left femur ([place]) 07/18/2020   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   Synovial cyst of lumbar facet joint 04/05/2020   Spinal stenosis of lumbar region with neurogenic claudication 04/05/2020   Dehydration 04/05/2020   Closed wedge compression fracture of L1 vertebra ([place]) 04/04/2020   Ambulatory dysfunction 04/04/2020   Intractable low back pain 04/04/2020   L1 vertebral fracture ([place]) 04/04/2020   Urine, incontinence, stress female 02/07/2019   Knee osteoarthritis 02/01/2018   Iron deficiency anemia 10/28/2017   Rhinitis, allergic 07/27/2017   GERD without esophagitis 06/16/2017   Chronic pain disorder 06/16/2017   Vitamin D deficiency, unspecified 05/25/2017   Hyperlipidemia 05/25/2017   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   Pain 02/26/2017   Chronic low back pain 12/02/2016   Abdominal pain 03/26/2016   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   Peptic esophagitis  03/26/2016   Neuralgia neuritis, sciatic nerve 03/26/2016   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   Chronic insomnia 03/30/2015   Chronic migraine 01/10/2015   Depression 04/20/2014   Generalized anxiety disorder 04/20/2014   Chronic back pain 04/20/2014   Chronic daily headache 04/20/2014   Essential hypertension 04/20/2014   Migraine headache 04/19/2014   Past Medical History:  Diagnosis Date   Anxiety    Arthritis    Chronic insomnia 03/30/2015   Chronic low back pain 12/02/2016   Depression    GERD (gastroesophageal reflux disease)    Hiatal hernia    High cholesterol    Hypertelorism    Hypertension    Meningioma (HCC)    Migraine    Family History  Problem Relation Age of Onset  Heart Problems Mother    Migraines Father    Stroke Father    Epilepsy Brother    Cancer Brother 52       unknown type - metastatic   Past Surgical History:  Procedure Laterality Date   FEMUR IM NAIL Left 06/02/2020   Procedure: INTRAMEDULLARY (IM) RETROGRADE FEMORAL NAILING - LEFT;  Surgeon: Rod Can, MD;  Location: [facility];  Service: Orthopedics;  Laterality: Left;   STOMACH SURGERY     Social History   Occupational History   Occupation: Retired  Tobacco Use   Smoking status: Never   Smokeless tobacco: Never  Vaping Use   Vaping Use: Never used  Substance and Sexual Activity   Alcohol use: No   Drug use: No   Sexual activity: Not Currently

## 2022-09-26 DIAGNOSIS — M6281 Muscle weakness (generalized): Secondary | ICD-10-CM | POA: Diagnosis not present

## 2022-09-26 DIAGNOSIS — M47896 Other spondylosis, lumbar region: Secondary | ICD-10-CM | POA: Diagnosis not present

## 2022-09-26 DIAGNOSIS — R2689 Other abnormalities of gait and mobility: Secondary | ICD-10-CM | POA: Diagnosis not present

## 2022-09-26 DIAGNOSIS — M5459 Other low back pain: Secondary | ICD-10-CM | POA: Diagnosis not present

## 2022-09-29 DIAGNOSIS — M6281 Muscle weakness (generalized): Secondary | ICD-10-CM | POA: Diagnosis not present

## 2022-09-29 DIAGNOSIS — M47896 Other spondylosis, lumbar region: Secondary | ICD-10-CM | POA: Diagnosis not present

## 2022-09-29 DIAGNOSIS — M5459 Other low back pain: Secondary | ICD-10-CM | POA: Diagnosis not present

## 2022-09-29 DIAGNOSIS — R2689 Other abnormalities of gait and mobility: Secondary | ICD-10-CM | POA: Diagnosis not present

## 2022-09-30 NOTE — Progress Notes (Unsigned)
Chief Complaint  Patient presents with   Follow-up    Dr. Ernestina Patches wanted pt to f/u with pcp to see if pcp would take over pain management or if pt needs to be referred to pain management.   HPI: Tracey Armstrong is a 86 y.o. female with PMHx significant for seizure disorder, HLD, depression,migraine headache,lumbar stenosis with radiculopathy, anxiety,HTN,and GERD here today to follow on recent ED visit. Evaluated in the ED on 09/18/22 for back pain. She already followed with her ortho on 09/25/22. Chronic lower back pain radiated to LE's, spinal stenosis with neurogenic claudication. Negative for fever,chills,saddle anesthesia, abnormal pain,changes in bowel/bladder function,urinary symptoms,or skin rash.  States that she has visited the ER multiple times due to the severity of the back pain, which has been "unmanageable" and has required more assistance with ADL's.  She has received epidural injections, last one in 08/27/22, it is not longer helping.  She has been prescribed hydrocodone-acetaminophen 5-325 mg for pain management. According to pt, it was recommended to continue pain management, she needs a referral. She has tolerated medication well, helps with pain, she has 6 tabs left and has been prescribed bid prn.  Anxiety on Alprazolam 1 mg tid prn, 45 tabs per month max. Medication also helps with anxiety and sleep.  She mentions difficulty with the pharmacy regarding the alprazolam prescription, but according to PMP, it appears she has been receiving the medication consistently for the past few months. She has not tolerated several SSRI's and SNRI's, aggravated migraine/headaches.  Review of Systems  Constitutional:  Positive for activity change and fatigue. Negative for appetite change and fever.  Respiratory:  Negative for cough, shortness of breath and wheezing.   Cardiovascular:  Negative for chest pain, palpitations and leg swelling.  Gastrointestinal:  Negative for nausea and  vomiting.  Musculoskeletal:  Positive for arthralgias, back pain and gait problem.  Neurological:  Negative for syncope and facial asymmetry.  Psychiatric/Behavioral:  Positive for sleep disturbance. Negative for confusion and hallucinations. The patient is nervous/anxious.   See other pertinent positives and negatives in HPI.  Current Outpatient Medications on File Prior to Visit  Medication Sig Dispense Refill   ALPRAZolam (XANAX) 1 MG tablet TAKE ONE-HALF TABLET BY MOUTH AT NOON AND 1 EVERY NIGHT AT BEDTIME. 45 tablet 3   antiseptic oral rinse (BIOTENE) LIQD Use 3 mls 3-4 times per day for dry mouth. Do not swallow. 237 mL 0   Cholecalciferol (VITAMIN D3) 1.25 MG (50000 UT) CAPS TAKE 1 CAPSULE BY MOUTH EVERY 14 DAYS 7 capsule 1   Dextran 70-Hypromellose (ARTIFICIAL TEARS PF OP) Place 1-2 drops into both eyes See admin instructions. Instill 1-2 drops into both eyes at bedtime and an additional 2-3 times a day as needed for dryness     diclofenac (FLECTOR) 1.3 % PTCH Place 1 patch onto the skin 2 (two) times daily. 60 patch 1   diphenhydramine-acetaminophen (TYLENOL PM) 25-500 MG TABS tablet Take 1.5 tablets by mouth at bedtime.     docusate sodium (COLACE) 100 MG capsule Take 1 capsule (100 mg total) by mouth 2 (two) times daily. 10 capsule 0   gabapentin (NEURONTIN) 300 MG capsule Take 1 capsule every afternoon 90 capsule 3   levETIRAcetam (KEPPRA) 500 MG tablet TAKE 1 TABLET(500 MG) BY MOUTH TWICE DAILY 180 tablet 3   Magnesium 400 MG TABS Take 400 mg by mouth at bedtime.     MAXALT-MLT 10 MG disintegrating tablet DISSOLVE ONE TABLET BY MOUTH AS NEEDED HEADACHE. 9  tablet 11   Melatonin 10 MG TABS Take 10 mg by mouth at bedtime as needed (for sleep).     methocarbamol (ROBAXIN) 500 MG tablet Take 1 tablet (500 mg total) by mouth 2 (two) times daily. 10 tablet 0   nitrofurantoin, macrocrystal-monohydrate, (MACROBID) 100 MG capsule Take 100 mg by mouth 2 (two) times daily.     omeprazole  (PRILOSEC OTC) 20 MG tablet Take 20 mg by mouth daily before breakfast.     POTASSIUM PO Take 1 tablet by mouth daily with lunch.     predniSONE (DELTASONE) 10 MG tablet Take 4 tablets (40 mg total) by mouth daily. 16 tablet 0   pyridOXINE (VITAMIN B-6) 100 MG tablet Take 400 mg by mouth daily with lunch.     riboflavin (VITAMIN B-2) 100 MG TABS tablet Take 400 mg by mouth daily with lunch.     tretinoin (RETIN-A) 0.1 % cream APPLY TO AFFECTED AREA EVERY DAY AT BEDTIME (Patient taking differently: Apply 1 application  topically at bedtime.) 45 g 2   verapamil (VERELAN PM) 180 MG 24 hr capsule TAKE 1 CAPSULE(180 MG) BY MOUTH DAILY 90 capsule 3   No current facility-administered medications on file prior to visit.   Past Medical History:  Diagnosis Date   Anxiety    Arthritis    Chronic insomnia 03/30/2015   Chronic low back pain 12/02/2016   Depression    GERD (gastroesophageal reflux disease)    Hiatal hernia    High cholesterol    Hypertelorism    Hypertension    Meningioma (HCC)    Migraine    Allergies  Allergen Reactions   Dihydrotachysterol Other (See Comments)    Dihydrotachysterol is a form of vitamin D. Might be "DHT Intensol" (Name Brand)- Reaction unknown- tolerating 50,000 units of Vitamin D-2 (Drisdol) in 2022   Trazodone And Nefazodone Other (See Comments)    Worsens headaches   Valium [Diazepam] Other (See Comments)    headaches   Citalopram Other (See Comments)    Other reaction(s): Headache   Other Other (See Comments)    "States all antidepressants cause migraines." - tolerates Xanax     Social History   Socioeconomic History   Marital status: Widowed    Spouse name: Not on file   Number of children: 4   Years of education: 38   Highest education level: Not on file  Occupational History   Occupation: Retired  Tobacco Use   Smoking status: Never   Smokeless tobacco: Never  Vaping Use   Vaping Use: Never used  Substance and Sexual Activity   Alcohol  use: No   Drug use: No   Sexual activity: Not Currently  Other Topics Concern   Not on file  Social History Narrative   Lives in [place] [facility]      Patient drinks 1 glass of tea daily.      Patient is right handed.   Social Determinants of Health   Financial Resource Strain: Low Risk  (11/18/2021)   Overall Financial Resource Strain (CARDIA)    Difficulty of Paying Living Expenses: Not hard at all  Food Insecurity: No Food Insecurity (11/18/2021)   Hunger Vital Sign    Worried About Running Out of Food in the Last Year: Never true    Ran Out of Food in the Last Year: Never true  Transportation Needs: No Transportation Needs (11/18/2021)   PRAPARE - Hydrologist (Medical): No  Lack of Transportation (Non-Medical): No  Physical Activity: Insufficiently Active (11/18/2021)   Exercise Vital Sign    Days of Exercise per Week: 7 days    Minutes of Exercise per Session: 20 min  Stress: No Stress Concern Present (11/18/2021)   [place]    Feeling of Stress : Not at all  Social Connections: Moderately Integrated (11/18/2021)   Social Connection and Isolation Panel [NHANES]    Frequency of Communication with Friends and Family: More than three times a week    Frequency of Social Gatherings with Friends and Family: More than three times a week    Attends Religious Services: 1 to 4 times per year    Active Member of [employer] or Organizations: Yes    Attends Archivist Meetings: 1 to 4 times per year    Marital Status: Widowed   Vitals:   10/01/22 1441  BP: 130/80  Pulse: 76  Resp: 16  SpO2: 92%   Body mass index is 29.31 kg/m.  Physical Exam Vitals and nursing note reviewed.  Constitutional:      General: She is not in acute distress.    Appearance: She is well-developed.  HENT:     Head: Normocephalic and atraumatic.     Mouth/Throat:     Mouth: Mucous membranes are  moist.  Eyes:     Conjunctiva/sclera: Conjunctivae normal.  Cardiovascular:     Rate and Rhythm: Normal rate and regular rhythm.     Heart sounds: No murmur heard. Pulmonary:     Effort: Pulmonary effort is normal. No respiratory distress.     Breath sounds: Normal breath sounds.  Abdominal:     Palpations: Abdomen is soft.     Tenderness: There is no abdominal tenderness.  Musculoskeletal:     Lumbar back: Tenderness present. No bony tenderness.       Back:  Skin:    General: Skin is warm.     Findings: No erythema or rash.  Neurological:     General: No focal deficit present.     Mental Status: She is alert and oriented to person, place, and time.     Cranial Nerves: No cranial nerve deficit.     Comments: Antalgic gait assisted with a walker  Psychiatric:        Mood and Affect: Affect normal. Mood is anxious.   ASSESSMENT AND PLAN:  Ms.Rosene was seen today for follow-up.  Diagnoses and all orders for this visit: Orders Placed This Encounter  Procedures   Ambulatory referral to Pain Clinic   Generalized anxiety disorder PMP reviewed. She has been filling her Alprazolam around every 30 days. No changes in Alprazolam dose, we discussed some side effects and the risk of interaction with opioid meds. She will keep appt in 10/2022.  Chronic pain disorder Currently on Hydrocodone-Acetaminophen 5-325 mg, which has helped.  I will be taking over prescription until she can see pain management. 2 weeks supply at the time. Side effects discussed and she understand the risk when taking benzodiazepines. Referral to pain management placed. Fall precautions discussed. F/U in 6 weeks.  Chronic back pain Problem is getting worse, affecting ADL's. Epidural injections are not longer helping. Continue Hydrocodone-Acetaminophen 5-325 mg bid prn. Referral to pain management placed.  Return in about 6 weeks (around 11/12/2022) for chronic problems.  Quante Pettry G. Martinique, MD  [facility]. [place] office.

## 2022-10-01 ENCOUNTER — Ambulatory Visit (INDEPENDENT_AMBULATORY_CARE_PROVIDER_SITE_OTHER): Payer: Medicare Other | Admitting: Family Medicine

## 2022-10-01 ENCOUNTER — Encounter: Payer: Self-pay | Admitting: Family Medicine

## 2022-10-01 VITALS — BP 130/80 | HR 76 | Resp 16 | Ht 62.0 in | Wt 160.2 lb

## 2022-10-01 DIAGNOSIS — M545 Low back pain, unspecified: Secondary | ICD-10-CM

## 2022-10-01 DIAGNOSIS — F411 Generalized anxiety disorder: Secondary | ICD-10-CM | POA: Diagnosis not present

## 2022-10-01 DIAGNOSIS — G894 Chronic pain syndrome: Secondary | ICD-10-CM | POA: Diagnosis not present

## 2022-10-01 DIAGNOSIS — G8929 Other chronic pain: Secondary | ICD-10-CM | POA: Diagnosis not present

## 2022-10-01 MED ORDER — HYDROCODONE-ACETAMINOPHEN 5-325 MG PO TABS: 1.0000 | ORAL_TABLET | Freq: Two times a day (BID) | ORAL | 0 refills | Status: DC | PRN

## 2022-10-01 NOTE — Assessment & Plan Note (Signed)
PMP reviewed. She has been filling her Alprazolam around every 30 days. No changes in Alprazolam dose, we discussed some side effects and the risk of interaction with opioid meds. She will keep appt in 10/2022.

## 2022-10-01 NOTE — Assessment & Plan Note (Signed)
Currently on Hydrocodone-Acetaminophen 5-325 mg, which has helped.  I will be taking over prescription until she can see pain management. 2 weeks supply at the time. Side effects discussed and she understand the risk when taking benzodiazepines. Referral to pain management placed. Fall precautions discussed. F/U in 6 weeks.

## 2022-10-01 NOTE — Assessment & Plan Note (Signed)
Problem is getting worse, affecting ADL's. Epidural injections are not longer helping. Continue Hydrocodone-Acetaminophen 5-325 mg bid prn. Referral to pain management placed.

## 2022-10-01 NOTE — Patient Instructions (Signed)
A few things to remember from today's visit:  Chronic pain disorder - Plan: Ambulatory referral to Pain Clinic  Chronic low back pain, unspecified back pain laterality, unspecified whether sciatica present - Plan: Ambulatory referral to Pain Clinic  Generalized anxiety disorder I am filling your hydrocodone-Acetaminophen until you see pain management. No changes in rest.  If you need refills for medications you take chronically, please call your pharmacy. Do not use My Chart to request refills or for acute issues that need immediate attention. If you send a my chart message, it may take a few days to be addressed, specially if I am not in the office.  Please be sure medication list is accurate. If a new problem present, please set up appointment sooner than planned today.

## 2022-10-02 ENCOUNTER — Encounter: Payer: Self-pay | Admitting: Family Medicine

## 2022-10-02 DIAGNOSIS — M6281 Muscle weakness (generalized): Secondary | ICD-10-CM | POA: Diagnosis not present

## 2022-10-02 DIAGNOSIS — R2689 Other abnormalities of gait and mobility: Secondary | ICD-10-CM | POA: Diagnosis not present

## 2022-10-02 DIAGNOSIS — M47896 Other spondylosis, lumbar region: Secondary | ICD-10-CM | POA: Diagnosis not present

## 2022-10-02 DIAGNOSIS — M5459 Other low back pain: Secondary | ICD-10-CM | POA: Diagnosis not present

## 2022-10-07 DIAGNOSIS — R2689 Other abnormalities of gait and mobility: Secondary | ICD-10-CM | POA: Diagnosis not present

## 2022-10-07 DIAGNOSIS — M47896 Other spondylosis, lumbar region: Secondary | ICD-10-CM | POA: Diagnosis not present

## 2022-10-07 DIAGNOSIS — M5459 Other low back pain: Secondary | ICD-10-CM | POA: Diagnosis not present

## 2022-10-07 DIAGNOSIS — M6281 Muscle weakness (generalized): Secondary | ICD-10-CM | POA: Diagnosis not present

## 2022-10-09 ENCOUNTER — Ambulatory Visit: Payer: Self-pay

## 2022-10-09 ENCOUNTER — Ambulatory Visit (INDEPENDENT_AMBULATORY_CARE_PROVIDER_SITE_OTHER): Payer: Medicare Other | Admitting: Physical Medicine and Rehabilitation

## 2022-10-09 VITALS — BP 104/71 | HR 97

## 2022-10-09 DIAGNOSIS — M48062 Spinal stenosis, lumbar region with neurogenic claudication: Secondary | ICD-10-CM | POA: Diagnosis not present

## 2022-10-09 DIAGNOSIS — M5416 Radiculopathy, lumbar region: Secondary | ICD-10-CM | POA: Diagnosis not present

## 2022-10-09 MED ORDER — METHYLPREDNISOLONE ACETATE 80 MG/ML IJ SUSP
80.0000 mg | Freq: Once | INTRAMUSCULAR | Status: AC
  Administered 2022-10-09: 80 mg

## 2022-10-09 NOTE — Progress Notes (Signed)
Functional Pain Scale - descriptive words and definitions  Unmanageable (7)  Pain interferes with normal ADL's/nothing seems to help/sleep is very difficult/active distractions are very difficult to concentrate on. Severe range order  Average Pain 7   +Driver, -BT, -Dye Allergies.  Lower back pain, no pain radiation

## 2022-10-09 NOTE — Patient Instructions (Signed)

## 2022-10-16 ENCOUNTER — Other Ambulatory Visit: Payer: Self-pay | Admitting: Family Medicine

## 2022-10-16 DIAGNOSIS — M545 Low back pain, unspecified: Secondary | ICD-10-CM

## 2022-10-16 NOTE — Telephone Encounter (Signed)
Pt called to request a refill of the   HYDROcodone-acetaminophen (NORCO/VICODIN) 5-325 MG tablet   [id number] - [place], [address] [place] AT [address] Phone: [phone]  Fax: [phone]      LOV:  10/01/22

## 2022-10-18 NOTE — Procedures (Signed)
Lumbosacral Transforaminal Epidural Steroid Injection - Sub-Pedicular Approach with Fluoroscopic Guidance  Patient: Tracey Armstrong      Date of Birth: 1935-03-31 MRN: [id number] PCP: Martinique, [name], MD      Visit Date: 10/09/2022   Universal Protocol:    Date/Time: 10/09/2022  Consent Given By: the patient  Position: PRONE  Additional Comments: Vital signs were monitored before and after the procedure. Patient was prepped and draped in the usual sterile fashion. The correct patient, procedure, and site was verified.   Injection Procedure Details:   Procedure diagnoses: Lumbar radiculopathy [M54.16]    Meds Administered:  Meds ordered this encounter  Medications   methylPREDNISolone acetate (DEPO-MEDROL) injection 80 mg    Laterality: Bilateral  Location/Site: L4  Needle:5.0 in., 22 ga.  Short bevel or Quincke spinal needle  Needle Placement: Transforaminal  Findings:    -Comments: Excellent flow of contrast along the nerve, nerve root and into the epidural space.  Procedure Details: After squaring off the end-plates to get a true AP view, the C-arm was positioned so that an oblique view of the foramen as noted above was visualized. The target area is just inferior to the "nose of the scotty dog" or sub pedicular. The soft tissues overlying this structure were infiltrated with 2-3 ml. of 1% Lidocaine without Epinephrine.  The spinal needle was inserted toward the target using a "trajectory" view along the fluoroscope beam.  Under AP and lateral visualization, the needle was advanced so it did not puncture dura and was located close the 6 O'Clock position of the pedical in AP tracterory. Biplanar projections were used to confirm position. Aspiration was confirmed to be negative for CSF and/or blood. A 1-2 ml. volume of Isovue-250 was injected and flow of contrast was noted at each level. Radiographs were obtained for documentation purposes.   After attaining the desired  flow of contrast documented above, a 0.5 to 1.0 ml test dose of 0.25% Marcaine was injected into each respective transforaminal space.  The patient was observed for 90 seconds post injection.  After no sensory deficits were reported, and normal lower extremity motor function was noted,   the above injectate was administered so that equal amounts of the injectate were placed at each foramen (level) into the transforaminal epidural space.   Additional Comments:  No complications occurred Dressing: 2 x 2 sterile gauze and Band-Aid    Post-procedure details: Patient was observed during the procedure. Post-procedure instructions were reviewed.  Patient left the clinic in stable condition.

## 2022-10-18 NOTE — Progress Notes (Signed)
Tracey Armstrong - 87 y.o. female MRN [id number]  Date of birth: Oct 16, 1935  Office Visit Note: Visit Date: 10/09/2022 PCP: Martinique, [name], MD Referred by: Martinique, [name], MD  Subjective: Chief Complaint  Patient presents with   Lower Back - Pain   HPI:  Tracey Armstrong is a 87 y.o. female who comes in today at the request of Barnet Pall, FNP for planned Bilateral L4-5 Lumbar Transforaminal epidural steroid injection with fluoroscopic guidance.  The patient has failed conservative care including home exercise, medications, time and activity modification.  This injection will be diagnostic and hopefully therapeutic.  Please see requesting physician notes for further details and justification.   ROS Otherwise per HPI.  Assessment & Plan: Visit Diagnoses:    ICD-10-CM   1. Lumbar radiculopathy  M54.16 XR C-ARM NO REPORT    Epidural Steroid injection    methylPREDNISolone acetate (DEPO-MEDROL) injection 80 mg    2. Spinal stenosis of lumbar region with neurogenic claudication  M48.062 XR C-ARM NO REPORT    Epidural Steroid injection    methylPREDNISolone acetate (DEPO-MEDROL) injection 80 mg      Plan: No additional findings.   Meds & Orders:  Meds ordered this encounter  Medications   methylPREDNISolone acetate (DEPO-MEDROL) injection 80 mg    Orders Placed This Encounter  Procedures   XR C-ARM NO REPORT   Epidural Steroid injection    Follow-up: Return for visit to requesting provider as needed.   Procedures: No procedures performed  Lumbosacral Transforaminal Epidural Steroid Injection - Sub-Pedicular Approach with Fluoroscopic Guidance  Patient: Tracey Armstrong      Date of Birth: 1935-09-26 MRN: [id number] PCP: Martinique, [name], MD      Visit Date: 10/09/2022   Universal Protocol:    Date/Time: 10/09/2022  Consent Given By: the patient  Position: PRONE  Additional Comments: Vital signs were monitored before and after the procedure. Patient was prepped and  draped in the usual sterile fashion. The correct patient, procedure, and site was verified.   Injection Procedure Details:   Procedure diagnoses: Lumbar radiculopathy [M54.16]    Meds Administered:  Meds ordered this encounter  Medications   methylPREDNISolone acetate (DEPO-MEDROL) injection 80 mg    Laterality: Bilateral  Location/Site: L4  Needle:5.0 in., 22 ga.  Short bevel or Quincke spinal needle  Needle Placement: Transforaminal  Findings:    -Comments: Excellent flow of contrast along the nerve, nerve root and into the epidural space.  Procedure Details: After squaring off the end-plates to get a true AP view, the C-arm was positioned so that an oblique view of the foramen as noted above was visualized. The target area is just inferior to the "nose of the scotty dog" or sub pedicular. The soft tissues overlying this structure were infiltrated with 2-3 ml. of 1% Lidocaine without Epinephrine.  The spinal needle was inserted toward the target using a "trajectory" view along the fluoroscope beam.  Under AP and lateral visualization, the needle was advanced so it did not puncture dura and was located close the 6 O'Clock position of the pedical in AP tracterory. Biplanar projections were used to confirm position. Aspiration was confirmed to be negative for CSF and/or blood. A 1-2 ml. volume of Isovue-250 was injected and flow of contrast was noted at each level. Radiographs were obtained for documentation purposes.   After attaining the desired flow of contrast documented above, a 0.5 to 1.0 ml test dose of 0.25% Marcaine was injected into each respective transforaminal space.  The patient was observed for 90 seconds post injection.  After no sensory deficits were reported, and normal lower extremity motor function was noted,   the above injectate was administered so that equal amounts of the injectate were placed at each foramen (level) into the transforaminal epidural  space.   Additional Comments:  No complications occurred Dressing: 2 x 2 sterile gauze and Band-Aid    Post-procedure details: Patient was observed during the procedure. Post-procedure instructions were reviewed.  Patient left the clinic in stable condition.    Clinical History: CT LUMBAR SPINE WITHOUT CONTRAST   TECHNIQUE:  Multidetector CT imaging of the lumbar spine was performed without  intravenous contrast administration. Multiplanar CT image  reconstructions were also generated.   COMPARISON:  Lumbar spine radiographs 04/04/2020 and MRI 09/09/2018   FINDINGS:  Segmentation: 5 lumbar type vertebrae.   Alignment: Mild thoracolumbar levoscoliosis. Grade 1 anterolisthesis  of L4 on L5, unchanged from the prior MRI.   Vertebrae: L1 superior endplate compression fracture with 30%  vertebral body height loss and 4 mm retropulsion of the superior  endplate. A fracture line remains visible although there is  surrounding sclerosis. Hemangioma in the L4 vertebral body.   Paraspinal and other soft tissues: Aortic atherosclerosis without  aneurysm.   Disc levels: Disc bulging and posterior element hypertrophy result  in chronic mild-to-moderate spinal stenosis at L3-4 and moderate to  severe spinal stenosis at L4-5. No significant spinal stenosis  related to mild L1 retropulsion.   IMPRESSION:  1. Possibly subacute L1 compression fracture with 30% height loss  and 4 mm retropulsion. No associated spinal stenosis.  2. Chronic mild-to-moderate spinal stenosis at L3-4 and moderate to  severe spinal stenosis at L4-5.  3. Aortic Atherosclerosis (ICD10-I70.0).    Electronically Signed    By: Logan Bores M.D.    On: 04/04/2020 17:39     Objective:  VS:  HT:    WT:   BMI:     BP:104/71  HR:97bpm  TEMP: ( )  RESP:  Physical Exam Vitals and nursing note reviewed.  Constitutional:      General: She is not in acute distress.    Appearance: Normal appearance. She is  not ill-appearing.  HENT:     Head: Normocephalic and atraumatic.     Right Ear: External ear normal.     Left Ear: External ear normal.  Eyes:     Extraocular Movements: Extraocular movements intact.  Cardiovascular:     Rate and Rhythm: Normal rate.     Pulses: Normal pulses.  Pulmonary:     Effort: Pulmonary effort is normal. No respiratory distress.  Abdominal:     General: There is no distension.     Palpations: Abdomen is soft.  Musculoskeletal:        General: Tenderness present.     Cervical back: Neck supple.     Right lower leg: No edema.     Left lower leg: No edema.     Comments: Patient has good distal strength with no pain over the greater trochanters.  No clonus or focal weakness.  Skin:    Findings: No erythema, lesion or rash.  Neurological:     General: No focal deficit present.     Mental Status: She is alert and oriented to person, place, and time.     Sensory: No sensory deficit.     Motor: No weakness or abnormal muscle tone.     Coordination: Coordination normal.  Gait: Gait abnormal.  Psychiatric:        Mood and Affect: Mood normal.        Behavior: Behavior normal.      Imaging: No results found.

## 2022-10-21 ENCOUNTER — Telehealth: Payer: Self-pay

## 2022-10-21 ENCOUNTER — Telehealth: Payer: Self-pay | Admitting: Family Medicine

## 2022-10-21 NOTE — Telephone Encounter (Signed)
Needs her pain medicine refilled, has appt with pain management this month.

## 2022-10-21 NOTE — Telephone Encounter (Signed)
--  PT is in a lot of pain. She goes to a pain specialist (she has not been there yet) and will see them on 1/17. She was given pain pills to last 30 days and she ran out. Pain level 10 in the back and the hips and it is chronic pain. No fever  ---Caller states that she is having back and hip pain. Reports that she was seen in the hospital twice in the last 2 weeks. Is supposed to see pain specialist 11/05/22. Is out of pain medication. Rates pain 10/10.  10/20/2022 9:33:23 AM Go to ED Now Martyn Ehrich, RN, Solmon Ice  12/22/3297 2:42:68 PM Go to ED Now (or PCP triage) Velta Addison, RN, Helene Kelp  Comments User: Daphene Calamity, RN Date/Time Eilene Ghazi Time): 10/20/2022 9:35:39 AM she never said she understood advice but definitely disagreed with it "Im not going to ER" so one could deduct she understood  Referrals GO TO FACILITY REFUSED

## 2022-10-21 NOTE — Telephone Encounter (Signed)
Requesting refill of HYDROcodone-acetaminophen (NORCO/VICODIN) 5-325 MG tablet can't get in to pain management until November 05 2022.   [facility] [place], [address] AT [address] Phone: [phone]  Fax: [phone]

## 2022-10-22 DIAGNOSIS — R2689 Other abnormalities of gait and mobility: Secondary | ICD-10-CM | POA: Diagnosis not present

## 2022-10-22 DIAGNOSIS — M47896 Other spondylosis, lumbar region: Secondary | ICD-10-CM | POA: Diagnosis not present

## 2022-10-22 DIAGNOSIS — M5459 Other low back pain: Secondary | ICD-10-CM | POA: Diagnosis not present

## 2022-10-22 DIAGNOSIS — M6281 Muscle weakness (generalized): Secondary | ICD-10-CM | POA: Diagnosis not present

## 2022-10-22 MED ORDER — HYDROCODONE-ACETAMINOPHEN 5-325 MG PO TABS: 1.0000 | ORAL_TABLET | Freq: Two times a day (BID) | ORAL | 0 refills | Status: DC | PRN

## 2022-10-22 NOTE — Telephone Encounter (Signed)
See other phone note/ rx refill.

## 2022-10-22 NOTE — Telephone Encounter (Signed)
Rx has been sent in. 

## 2022-10-24 DIAGNOSIS — M47896 Other spondylosis, lumbar region: Secondary | ICD-10-CM | POA: Diagnosis not present

## 2022-10-24 DIAGNOSIS — R2689 Other abnormalities of gait and mobility: Secondary | ICD-10-CM | POA: Diagnosis not present

## 2022-10-24 DIAGNOSIS — M5459 Other low back pain: Secondary | ICD-10-CM | POA: Diagnosis not present

## 2022-10-24 DIAGNOSIS — M6281 Muscle weakness (generalized): Secondary | ICD-10-CM | POA: Diagnosis not present

## 2022-10-30 DIAGNOSIS — M6281 Muscle weakness (generalized): Secondary | ICD-10-CM | POA: Diagnosis not present

## 2022-10-30 DIAGNOSIS — R2689 Other abnormalities of gait and mobility: Secondary | ICD-10-CM | POA: Diagnosis not present

## 2022-10-30 DIAGNOSIS — M5459 Other low back pain: Secondary | ICD-10-CM | POA: Diagnosis not present

## 2022-10-30 DIAGNOSIS — M47896 Other spondylosis, lumbar region: Secondary | ICD-10-CM | POA: Diagnosis not present

## 2022-11-04 DIAGNOSIS — M6281 Muscle weakness (generalized): Secondary | ICD-10-CM | POA: Diagnosis not present

## 2022-11-04 DIAGNOSIS — M5459 Other low back pain: Secondary | ICD-10-CM | POA: Diagnosis not present

## 2022-11-04 DIAGNOSIS — R2689 Other abnormalities of gait and mobility: Secondary | ICD-10-CM | POA: Diagnosis not present

## 2022-11-04 DIAGNOSIS — M47896 Other spondylosis, lumbar region: Secondary | ICD-10-CM | POA: Diagnosis not present

## 2022-11-05 ENCOUNTER — Encounter
Payer: Medicare Other | Attending: Physical Medicine and Rehabilitation | Admitting: Physical Medicine and Rehabilitation

## 2022-11-05 ENCOUNTER — Encounter: Payer: Self-pay | Admitting: Physical Medicine and Rehabilitation

## 2022-11-05 VITALS — BP 115/77 | HR 87 | Ht 62.0 in | Wt 161.0 lb

## 2022-11-05 DIAGNOSIS — M47816 Spondylosis without myelopathy or radiculopathy, lumbar region: Secondary | ICD-10-CM | POA: Insufficient documentation

## 2022-11-05 DIAGNOSIS — R569 Unspecified convulsions: Secondary | ICD-10-CM | POA: Insufficient documentation

## 2022-11-05 DIAGNOSIS — S32010A Wedge compression fracture of first lumbar vertebra, initial encounter for closed fracture: Secondary | ICD-10-CM | POA: Diagnosis not present

## 2022-11-05 DIAGNOSIS — Z9181 History of falling: Secondary | ICD-10-CM | POA: Insufficient documentation

## 2022-11-05 DIAGNOSIS — M5416 Radiculopathy, lumbar region: Secondary | ICD-10-CM | POA: Diagnosis not present

## 2022-11-05 DIAGNOSIS — G894 Chronic pain syndrome: Secondary | ICD-10-CM | POA: Insufficient documentation

## 2022-11-05 MED ORDER — GABAPENTIN 300 MG PO CAPS: 300.0000 mg | ORAL_CAPSULE | Freq: Three times a day (TID) | ORAL | 3 refills | Status: DC

## 2022-11-05 NOTE — Patient Instructions (Addendum)
I will tough base with Dr. Ernestina Patches regarding possible medial branch blocks and ablation to treat your back arthritis. If he does not perform these, I will refer you to Dr. Letta Pate.   I will increase your gabapentin to 300 mg three times a day. Call me in 1-2 weeks to let me know how this is going.   I will see you back in 1 month. If pain is not improved at that time, we can discuss additional medication options. If you have any concerns in the meantime, feel free to call.

## 2022-11-05 NOTE — Progress Notes (Signed)
Subjective:    Patient ID: Tracey Armstrong, female    DOB: 1935/06/11, 88 y.o.   MRN: [id number]  HPI Tracey Armstrong is a 88 y.o. year old female  who  has a past medical history of Anxiety, Arthritis, Chronic insomnia (03/30/2015), Chronic low back pain (12/02/2016), Depression, GERD (gastroesophageal reflux disease), Hiatal hernia, High cholesterol, Hypertelorism, Hypertension, Meningioma ([place]), and Migraine.   They are presenting to PM&R clinic as a new patient for pain management evaluation. They were referred by Dr. [name] for treatment of chronic low back pain. She is seen today with her caregiver.   Of note, follows with PM&R Dr. Magnus Sinning at [facility]. Per their notes, "Chronic, worsening and severe bilateral lower back pain radiating to hips and buttocks. Patient continues to have severe pain despite good conservative therapies such as formal physical therapy, home exercise regimen, rest and use of medications. Patients clinical presentation and exam are consistent with neurogenic claudication as a result of spinal canal stenosis. There is moderate to severe spinal canal stenosis at the level of L4-L5 on CT imaging of lumbar spine from 2021, more recent CT imaging of lumbar spine does report increased progression of disc height loss at L1 vertebral fracture and mild unchanged spinal canal stenosis at L4-L5. After reviewing imaging we feel stenosis at the level of L4-L5 is more moderate to severe. We can't completely exclude that her pain could be a result of chronic L1 compression fracture, however this presentation would seem to illicite pain to more upper lumbar region. Next step is to perform bilateral L4 transforaminal epidural steroid injection under fluoroscopic guidance. If good relief of pain with injection we can repeat this procedure infrequently as needed. I also wrote paper order for continued physical therapy at [facility], patient instructed to take this directly to physical  therapist to set up treatments. No red flag symptoms noted upon exam today.    2. I did discuss medication management with patient today. I explained to her that we do not treat chronic pain with long term opioid mediation. I do feel she would benefit from chronic pain management whether this is done with her primary care provider Dr. [name] or with a chronic pain management specialist. I did provide patient with short course of Norco until we are able to figure out who will be taking over her chronic pain management."  Did have 2 ER visits in December for low back pain unable to get out of bed; was given short term opiates and discharged.   Source: Low back > L hip to thigh. Also describes "knots in my legs that hurt", and tingling in her plantar L foot with redness that waked her up at nighttime.  Inciting incident: Long-standing low back pain worsened s/p fall on ice ~5 years ago; Had a fall from seizure with resulting femur fracture and IM nailing with Dr. Rod Can in August of 2021 , pain has been ongoing since.  Duration of pain: Constant, with intermittent worsening Description of pain: Aching pain in low back, with tingling down her bilateral hips. Severity: On average 8 /10. At worst 10 /10. Unsure of scaling on good days.  Exacerbating factors: Sitting for long periods, worsens when she goes to stand up. Standing for long periods.  Remitting factors: Laying on her back (she sleeps on her back d/t difficulty positioning her left hip/leg). Large ice packs on her back.  Red flag symptoms: Patient denies saddle anesthesia, loss of bowel or bladder continence,  new weakness, new numbness/tingling, + pain waking up at nighttime (L heel).  Medications tried: Topical medications ( mild effect) : Diclofenac patch prescribed; she is not using. Does use icy-hot patches with mild improvement. Other patches do not help. Voltaren gel on her back and hips also helps.  Nsaids ( x effect): Avoids  NSAIDs d/t Hx GI bleed from medication use with migraine headaches several years prior. Migraines are now well controlled with B2, B12, magnesium and B6 supplementation.  Tylenol  ( moderate effect): Takes Tylenol arthritis 1.5 tabs BID daily and Tylenol PM 1.5 tabs nightly.  Opiates  ( good effect): Hydrocodone-Acetamin 5-325 Mg BID Prn #30 tabs filled 10/22/22. She states it is effective and "helps me sleep better". Does have issues with sleepiness/fatigue as a side effect if she takes it during the day. Has tried tramadol in the past, which worsened her headaches.   Gabapentin / Lyrica  ( ? effect): Gabapentin 300 mg once daily for seizure control. Unsure if it works for pain; has not tried coming off d/t concern for seizures. Did take it TID at a rehab center at one point, with good effect, no side effects.   TCAs  ( x effect): never tried SNRIs  ( x effect): never tried Other  ( no effect): Robaxin 500 mg BID tried; took her off of it because no good effect.   Other treatments: PT/OT  (good effect): Currently at [facility] for PT; frequently gets PT 2x weekly. She feels it helps her mobility with the walker, but is not effective for pain.  Accupuncture/chiropractor/massage  ( no effect): Went to a [employer] several years prior; accupuncture a long time ago for migraines without effect.  TENs unit ( no effect): Never tried Injections ( moderate effect): Last seen by Dr. Ernestina Patches 10/09/22 for Bilateral L4-5 Lumbar Transforaminal epidural steroid injection. Did have improvement with these injections in the past, but decreasing improvements.Used to get dry needling with PT, with some benefit.  Surgery ( x effect) : none on the back; was evaluated for C1 vertebroplasty but "the surgeon didn't want to put her under anesthesia." Other  ( no effect): Ambulates with a rollator; has noticed increased arm and hand pain she thinks from weightbearing on the rollator.   Lives in assisted living facility.    Goals for pain control: To maintain ambulation with her walker and "stay out of a wheelchair".   Pain Inventory Average Pain 8 Pain Right Now 9 My pain is constant, sharp, burning, tingling, and aching  In the last 24 hours, has pain interfered with the following? General activity 7 Relation with others 6 Enjoyment of life 5 What TIME of day is your pain at its worst? evening Sleep (in general) Good  Pain is worse with: walking, bending, sitting, inactivity, and standing Pain improves with: therapy/exercise Relief from Meds: 8  walk with assistance use a walker how many minutes can you walk? 10-15 ability to climb steps?  no do you drive?  no needs help with transfers Do you have any goals in this area?  yes  retired I need assistance with the following:  dressing, bathing, household duties, and shopping Do you have any goals in this area?  yes  bladder control problems numbness tingling trouble walking dizziness confusion depression anxiety  Any changes since last visit?  no  Any changes since last visit?  no    Family History  Problem Relation Age of Onset   Heart Problems Mother  Migraines Father    Stroke Father    Epilepsy Brother    Cancer Brother 52       unknown type - metastatic   Social History   Socioeconomic History   Marital status: Widowed    Spouse name: Not on file   Number of children: 4   Years of education: 47   Highest education level: Not on file  Occupational History   Occupation: Retired  Tobacco Use   Smoking status: Never   Smokeless tobacco: Never  Vaping Use   Vaping Use: Never used  Substance and Sexual Activity   Alcohol use: No   Drug use: No   Sexual activity: Not Currently  Other Topics Concern   Not on file  Social History Narrative   Lives in [place] [facility]      Patient drinks 1 glass of tea daily.      Patient is right handed.   Social Determinants of Health   Financial Resource Strain:  Low Risk  (11/18/2021)   Overall Financial Resource Strain (CARDIA)    Difficulty of Paying Living Expenses: Not hard at all  Food Insecurity: No Food Insecurity (11/18/2021)   Hunger Vital Sign    Worried About Running Out of Food in the Last Year: Never true    Ran Out of Food in the Last Year: Never true  Transportation Needs: No Transportation Needs (11/18/2021)   PRAPARE - Hydrologist (Medical): No    Lack of Transportation (Non-Medical): No  Physical Activity: Insufficiently Active (11/18/2021)   Exercise Vital Sign    Days of Exercise per Week: 7 days    Minutes of Exercise per Session: 20 min  Stress: No Stress Concern Present (11/18/2021)   [place]    Feeling of Stress : Not at all  Social Connections: Moderately Integrated (11/18/2021)   Social Connection and Isolation Panel [NHANES]    Frequency of Communication with Friends and Family: More than three times a week    Frequency of Social Gatherings with Friends and Family: More than three times a week    Attends Religious Services: 1 to 4 times per year    Active Member of [employer] or Organizations: Yes    Attends Archivist Meetings: 1 to 4 times per year    Marital Status: Widowed   Past Surgical History:  Procedure Laterality Date   FEMUR IM NAIL Left 06/02/2020   Procedure: INTRAMEDULLARY (IM) RETROGRADE FEMORAL NAILING - LEFT;  Surgeon: Rod Can, MD;  Location: [facility];  Service: Orthopedics;  Laterality: Left;   STOMACH SURGERY     Past Medical History:  Diagnosis Date   Anxiety    Arthritis    Chronic insomnia 03/30/2015   Chronic low back pain 12/02/2016   Depression    GERD (gastroesophageal reflux disease)    Hiatal hernia    High cholesterol    Hypertelorism    Hypertension    Meningioma (H[facility])    Migraine    Wt 161 lb (73 kg)   BMI 29.45 kg/m   Opioid Risk Score:   Fall Risk Score:   `1  Depression screen [facility] 2/9     11/05/2022   10:07 AM 10/01/2022    2:54 PM 07/30/2022   11:19 AM 05/14/2022   11:20 AM 02/26/2022   12:22 PM 11/18/2021    2:45 PM 10/28/2021    9:54 AM  Depression screen  PHQ 2/9  Decreased Interest 3 0 0 0 0 0 0  Down, Depressed, Hopeless 0 0 0 0 0 0 0  PHQ - 2 Score 3 0 0 0 0 0 0  Altered sleeping 3  0 2     Tired, decreased energy 3  0 2     Change in appetite 1  0 0     Feeling bad or failure about yourself  0  0 0     Trouble concentrating 0  0 2     Moving slowly or fidgety/restless 0  0 0     Suicidal thoughts 0  0 0     PHQ-9 Score 10  0 6     Difficult doing work/chores   Not difficult at all Somewhat difficult       Review of Systems  Musculoskeletal:  Positive for back pain and gait problem.       Pain in both hips, both legs, both elbows, both arms  All other systems reviewed and are negative.     Objective:   Physical Exam  Constitution: Appropriate appearance for age. No apparent distress. HEENT: PERRL, EOMI grossly intact.  Resp: CTAB. No rales, rhonchi, or wheezing. Cardio: RRR. No mumurs, rubs, or gallops. 1+ nonpitting bilateral peripheral edema. Abdomen: Nondistended. Nontender. +bowel sounds. Psych: Appropriate mood and affect. Neuro: AAOx4. Sensation to light touch "more" on L medial malleolus, lateral malleolus, and heel compared to R; denies sensitivity.   MSK: Moving all 4 limbs antigraivty and against resistance; BL UE 5/5, BL LE 5/5 except hip flexion 3-/5 bilaterally limited due to concurrent hip and back pain.  TTP with minimal pressure throughout bilateral shins and calves, with exquisite tenderness and mild fluctuant edema around the pes anserine bursa; no warmth or discoloration.   Back Exam:   Inspection: Pelvis was even.  Lumbar lordotic curvature was reduced, with severe cervicothoracic kyphosis .  There was no evidence of scoliosis.  Palpation: Palpatory exam revealed ttp at the midline upper lumbar  spine, b/l lumbar paraspinals, bilateral flanks, and L greater trochanters. No TTP PSIS, SI joints . There was no evidence of spasm. No trigger points were noted.    ROM:  Flexion limited,  Extension extremely limited  Special/provocative testing:    SLR: + Bilateral with mild aching pain down left lateral thigh   Slump test: Negative    Facet loading: + L>R rotation   TTP at paraspinals: Moderate, bilateral in lower lumbars  Unable to observe standing balance due to safety concerns without walker. Gait: Severe kyphosis with forward lean over rollator handles, decreased stride length and decreased foot clearance bilaterally. Antalgic pattern.       Assessment & Plan:   Tracey Armstrong is a 88 y.o. year old female  who  has a past medical history of Anxiety, Arthritis, Chronic insomnia (03/30/2015), Chronic low back pain (12/02/2016), Depression, GERD (gastroesophageal reflux disease), Hiatal hernia, High cholesterol, Hypertelorism, Hypertension, Meningioma ([place]), and Migraine.   They are presenting to PM&R clinic as a new patient for pain management evaluation. They were referred by Dr. [name] for treatment of chronic low back pain. She is seen today with her caregiver.   Primary concern today is bilateral low back > L hip and thigh pain.   Chronic pain disorder Assessment & Plan: Back exam is consistent with local pain from L1 chronic compression fracture, L lumbosacral radiculopathy and L>R facet arthropathy. Description of back pain (worse localized than radiating, worse with  movement after prolonged laying/sitting/standing, improvement with activity) is more consistent with arthritic pain. Prior imaging also noted moderate to severe facet arthropathy in lower lumbar spine (L4-5, L5-S1).   Additional findings include bilateral pes anserine bursitis and L greater trochanteric bursitis .  Given Hx fatigue with use of low-dose Norco 5 mg tablets, along with concurrent use of Xanax for  sleep, feel chronic narcotic pain management is inappropriate for this patient at this time, as it would incur an undue risk of falling with high likelihood of recurrent injury.   Will manage with non-narcotic medications and interventions for now. Can re-evaluate at upcoming visits. Follow up in 1 month.    Lumbar facet arthropathy Assessment & Plan: Feel this is her primary pain generator and limitation for improved mobility at this time.   Will inquire with Dr. Romona Curls office if they would consider and perform medial branch blocks/ablation. If not offered at their practice, can refer to Dr. Letta Pate at our office.  Advised patient that single daily Ibuprofen use is low risk for recurrent GI bleed, and she may try this with tylenol for AM stiffness and arthritic pains.    Lumbar radiculopathy Assessment & Plan: Can continue ESI with Dr. Ernestina Patches as long as she is getting good benefit.  Increased gabapentin from 300 mg once daily to 300 mg TID for pain control. Patient reports she has tolerated this dosing in the past without side effects. She is to contact clinic in 1-2 weeks after starting medication to report effect.      Closed wedge compression fracture of L1 vertebra, initial encounter [facility]) Assessment & Plan: Per most recent CT 09/19/22: Progression of height loss at L1 vertebral fracture now with greater than 75% central height loss. There is 5 mm retropulsion of the posterosuperior corner.  Managed nonoperatively due to concern for anesthesia risk. Does not seem to be primary pain generator at this time, but may consider second opinion for vertebroplasty if she develops further symptoms.    Seizures ([place]) Assessment & Plan: Has been stable on medication for several years.  Discussed that with increased gabapentin, will not increase seizure risk, although will advise tapering medication if patient has to wean after being on an increased dose for an extended  period.  Would avoid Tramadol due to seizure risk   Personal history of fall Assessment & Plan: Recommended participation in an aerobic exercise class at her facility in [place] 1-2x weekly as tolerated, in addition to ongoing PT, as this is the only intervention known to reduce risk of falling.    Other orders -     Gabapentin; Take 1 capsule (300 mg total) by mouth 3 (three) times daily. Take 1 capsule every afternoon  Dispense: 90 capsule; Refill: [place], DO 11/05/2022

## 2022-11-05 NOTE — Assessment & Plan Note (Addendum)
Back exam is consistent with local pain from L1 chronic compression fracture, L lumbosacral radiculopathy and L>R facet arthropathy. Description of back pain (worse localized than radiating, worse with movement after prolonged laying/sitting/standing, improvement with activity) is more consistent with arthritic pain. Prior imaging also noted moderate to severe facet arthropathy in lower lumbar spine (L4-5, L5-S1).   Additional findings include bilateral pes anserine bursitis and L greater trochanteric bursitis .  Given Hx fatigue with use of low-dose Norco 5 mg tablets, along with concurrent use of Xanax for sleep, feel chronic narcotic pain management is inappropriate for this patient at this time, as it would incur an undue risk of falling with high likelihood of recurrent injury.   Will manage with non-narcotic medications and interventions for now. Can re-evaluate at upcoming visits. Follow up in 1 month.

## 2022-11-05 NOTE — Assessment & Plan Note (Signed)
Can continue ESI with Dr. Ernestina Patches as long as she is getting good benefit.  Increased gabapentin from 300 mg once daily to 300 mg TID for pain control. Patient reports she has tolerated this dosing in the past without side effects. She is to contact clinic in 1-2 weeks after starting medication to report effect.

## 2022-11-05 NOTE — Assessment & Plan Note (Signed)
Has been stable on medication for several years.  Discussed that with increased gabapentin, will not increase seizure risk, although will advise tapering medication if patient has to wean after being on an increased dose for an extended period.  Would avoid Tramadol due to seizure risk

## 2022-11-05 NOTE — Assessment & Plan Note (Signed)
Recommended participation in an aerobic exercise class at her facility in [facility] 1-2x weekly as tolerated, in addition to ongoing PT, as this is the only intervention known to reduce risk of falling.

## 2022-11-05 NOTE — Assessment & Plan Note (Signed)
Feel this is her primary pain generator and limitation for improved mobility at this time.   Will inquire with Dr. Romona Curls office if they would consider and perform medial branch blocks/ablation. If not offered at their practice, can refer to Dr. Letta Pate at our office.  Advised patient that single daily Ibuprofen use is low risk for recurrent GI bleed, and she may try this with tylenol for AM stiffness and arthritic pains.

## 2022-11-05 NOTE — Assessment & Plan Note (Signed)
Per most recent CT 09/19/22: Progression of height loss at L1 vertebral fracture now with greater than 75% central height loss. There is 5 mm retropulsion of the posterosuperior corner.  Managed nonoperatively due to concern for anesthesia risk. Does not seem to be primary pain generator at this time, but may consider second opinion for vertebroplasty if she develops further symptoms.

## 2022-11-06 ENCOUNTER — Telehealth: Payer: Self-pay

## 2022-11-06 DIAGNOSIS — R2689 Other abnormalities of gait and mobility: Secondary | ICD-10-CM | POA: Diagnosis not present

## 2022-11-06 DIAGNOSIS — M6281 Muscle weakness (generalized): Secondary | ICD-10-CM | POA: Diagnosis not present

## 2022-11-06 DIAGNOSIS — M5459 Other low back pain: Secondary | ICD-10-CM | POA: Diagnosis not present

## 2022-11-06 DIAGNOSIS — M47896 Other spondylosis, lumbar region: Secondary | ICD-10-CM | POA: Diagnosis not present

## 2022-11-06 MED ORDER — GABAPENTIN 300 MG PO CAPS: 300.0000 mg | ORAL_CAPSULE | Freq: Three times a day (TID) | ORAL | 3 refills | Status: DC

## 2022-11-06 NOTE — Telephone Encounter (Signed)
How is patient suppose to be taking Gabapentin? Directions says take one capsule by mouth three times daily. Take one capsule every afternoon.

## 2022-11-06 NOTE — Addendum Note (Signed)
Addended by: Durel Salts on: 11/06/2022 03:26 PM   Modules accepted: Orders

## 2022-11-11 DIAGNOSIS — M47896 Other spondylosis, lumbar region: Secondary | ICD-10-CM | POA: Diagnosis not present

## 2022-11-11 DIAGNOSIS — M5459 Other low back pain: Secondary | ICD-10-CM | POA: Diagnosis not present

## 2022-11-11 DIAGNOSIS — M6281 Muscle weakness (generalized): Secondary | ICD-10-CM | POA: Diagnosis not present

## 2022-11-11 DIAGNOSIS — R2689 Other abnormalities of gait and mobility: Secondary | ICD-10-CM | POA: Diagnosis not present

## 2022-11-13 DIAGNOSIS — M5459 Other low back pain: Secondary | ICD-10-CM | POA: Diagnosis not present

## 2022-11-13 DIAGNOSIS — M6281 Muscle weakness (generalized): Secondary | ICD-10-CM | POA: Diagnosis not present

## 2022-11-13 DIAGNOSIS — M47896 Other spondylosis, lumbar region: Secondary | ICD-10-CM | POA: Diagnosis not present

## 2022-11-13 DIAGNOSIS — R2689 Other abnormalities of gait and mobility: Secondary | ICD-10-CM | POA: Diagnosis not present

## 2022-11-14 ENCOUNTER — Other Ambulatory Visit: Payer: Self-pay | Admitting: Neurology

## 2022-11-17 ENCOUNTER — Telehealth: Payer: Self-pay | Admitting: Physical Medicine and Rehabilitation

## 2022-11-17 NOTE — Telephone Encounter (Signed)
Attempted to call patient regarding referral to Dr. Ernestina Patches for MBB, as his office does indeed do these and he is willing to perform if referred. Patient did not pick up, no VM option. Will re-address at next visit.    Gertie Gowda, DO 11/17/2022

## 2022-11-18 DIAGNOSIS — M47896 Other spondylosis, lumbar region: Secondary | ICD-10-CM | POA: Diagnosis not present

## 2022-11-18 DIAGNOSIS — M6281 Muscle weakness (generalized): Secondary | ICD-10-CM | POA: Diagnosis not present

## 2022-11-18 DIAGNOSIS — M5459 Other low back pain: Secondary | ICD-10-CM | POA: Diagnosis not present

## 2022-11-18 DIAGNOSIS — R2689 Other abnormalities of gait and mobility: Secondary | ICD-10-CM | POA: Diagnosis not present

## 2022-11-19 ENCOUNTER — Ambulatory Visit (INDEPENDENT_AMBULATORY_CARE_PROVIDER_SITE_OTHER): Payer: Medicare Other

## 2022-11-19 VITALS — Ht 62.0 in | Wt 161.0 lb

## 2022-11-19 DIAGNOSIS — Z0000 Encounter for general adult medical examination without abnormal findings: Secondary | ICD-10-CM

## 2022-11-19 NOTE — Patient Instructions (Addendum)
Tracey Armstrong , Thank you for taking time to come for your Medicare Wellness Visit. I appreciate your ongoing commitment to your health goals. Please review the following plan we discussed and let me know if I can assist you in the future.   These are the goals we discussed:  Goals       Patient Stated      Will take care of you and enjoy your new environment  Would like to be able to walk without walker      Patient stated (pt-stated)      I would like to walk walker.         This is a list of the screening recommended for you and due dates:  Health Maintenance  Topic Date Due   COVID-19 Vaccine (1) 12/05/2022*   Flu Shot  01/18/2023*   Zoster (Shingles) Vaccine (1 of 2) 02/17/2023*   Pneumonia Vaccine (1 - PCV) 11/20/2023*   Medicare Annual Wellness Visit  11/20/2023   DTaP/Tdap/Td vaccine (2 - Td or Tdap) 08/30/2024   DEXA scan (bone density measurement)  Completed   HPV Vaccine  Aged Out  *Topic was postponed. The date shown is not the original due date.    Advanced directives In Chart  Conditions/risks identified: None  Next appointment: Follow up in one year for your annual wellness visit     Preventive Care 65 Years and Older, Female Preventive care refers to lifestyle choices and visits with your health care provider that can promote health and wellness. What does preventive care include? A yearly physical exam. This is also called an annual well check. Dental exams once or twice a year. Routine eye exams. Ask your health care provider how often you should have your eyes checked. Personal lifestyle choices, including: Daily care of your teeth and gums. Regular physical activity. Eating a healthy diet. Avoiding tobacco and drug use. Limiting alcohol use. Practicing safe sex. Taking low-dose aspirin every day. Taking vitamin and mineral supplements as recommended by your health care provider. What happens during an annual well check? The services and  screenings done by your health care provider during your annual well check will depend on your age, overall health, lifestyle risk factors, and family history of disease. Counseling  Your health care provider may ask you questions about your: Alcohol use. Tobacco use. Drug use. Emotional well-being. Home and relationship well-being. Sexual activity. Eating habits. History of falls. Memory and ability to understand (cognition). Work and work Statistician. Reproductive health. Screening  You may have the following tests or measurements: Height, weight, and BMI. Blood pressure. Lipid and cholesterol levels. These may be checked every 5 years, or more frequently if you are over 31 years old. Skin check. Lung cancer screening. You may have this screening every year starting at age 28 if you have a 30-pack-year history of smoking and currently smoke or have quit within the past 15 years. Fecal occult blood test (FOBT) of the stool. You may have this test every year starting at age 8. Flexible sigmoidoscopy or colonoscopy. You may have a sigmoidoscopy every 5 years or a colonoscopy every 10 years starting at age 20. Hepatitis C blood test. Hepatitis B blood test. Sexually transmitted disease (STD) testing. Diabetes screening. This is done by checking your blood sugar (glucose) after you have not eaten for a while (fasting). You may have this done every 1-3 years. Bone density scan. This is done to screen for osteoporosis. You may have this done starting  at age 24. Mammogram. This may be done every 1-2 years. Talk to your health care provider about how often you should have regular mammograms. Talk with your health care provider about your test results, treatment options, and if necessary, the need for more tests. Vaccines  Your health care provider may recommend certain vaccines, such as: Influenza vaccine. This is recommended every year. Tetanus, diphtheria, and acellular pertussis (Tdap,  Td) vaccine. You may need a Td booster every 10 years. Zoster vaccine. You may need this after age 48. Pneumococcal 13-valent conjugate (PCV13) vaccine. One dose is recommended after age 82. Pneumococcal polysaccharide (PPSV23) vaccine. One dose is recommended after age 35. Talk to your health care provider about which screenings and vaccines you need and how often you need them. This information is not intended to replace advice given to you by your health care provider. Make sure you discuss any questions you have with your health care provider. Document Released: 11/02/2015 Document Revised: 06/25/2016 Document Reviewed: 08/07/2015 Elsevier Interactive Patient Education  2017 [place] Prevention in the Home Falls can cause injuries. They can happen to people of all ages. There are many things you can do to make your home safe and to help prevent falls. What can I do on the outside of my home? Regularly fix the edges of walkways and driveways and fix any cracks. Remove anything that might make you trip as you walk through a door, such as a raised step or threshold. Trim any bushes or trees on the path to your home. Use bright outdoor lighting. Clear any walking paths of anything that might make someone trip, such as rocks or tools. Regularly check to see if handrails are loose or broken. Make sure that both sides of any steps have handrails. Any raised decks and porches should have guardrails on the edges. Have any leaves, snow, or ice cleared regularly. Use sand or salt on walking paths during winter. Clean up any spills in your garage right away. This includes oil or grease spills. What can I do in the bathroom? Use night lights. Install grab bars by the toilet and in the tub and shower. Do not use towel bars as grab bars. Use non-skid mats or decals in the tub or shower. If you need to sit down in the shower, use a plastic, non-slip stool. Keep the floor dry. Clean up any  water that spills on the floor as soon as it happens. Remove soap buildup in the tub or shower regularly. Attach bath mats securely with double-sided non-slip rug tape. Do not have throw rugs and other things on the floor that can make you trip. What can I do in the bedroom? Use night lights. Make sure that you have a light by your bed that is easy to reach. Do not use any sheets or blankets that are too big for your bed. They should not hang down onto the floor. Have a firm chair that has side arms. You can use this for support while you get dressed. Do not have throw rugs and other things on the floor that can make you trip. What can I do in the kitchen? Clean up any spills right away. Avoid walking on wet floors. Keep items that you use a lot in easy-to-reach places. If you need to reach something above you, use a strong step stool that has a grab bar. Keep electrical cords out of the way. Do not use floor polish or wax that makes  floors slippery. If you must use wax, use non-skid floor wax. Do not have throw rugs and other things on the floor that can make you trip. What can I do with my stairs? Do not leave any items on the stairs. Make sure that there are handrails on both sides of the stairs and use them. Fix handrails that are broken or loose. Make sure that handrails are as long as the stairways. Check any carpeting to make sure that it is firmly attached to the stairs. Fix any carpet that is loose or worn. Avoid having throw rugs at the top or bottom of the stairs. If you do have throw rugs, attach them to the floor with carpet tape. Make sure that you have a light switch at the top of the stairs and the bottom of the stairs. If you do not have them, ask someone to add them for you. What else can I do to help prevent falls? Wear shoes that: Do not have high heels. Have rubber bottoms. Are comfortable and fit you well. Are closed at the toe. Do not wear sandals. If you use a  stepladder: Make sure that it is fully opened. Do not climb a closed stepladder. Make sure that both sides of the stepladder are locked into place. Ask someone to hold it for you, if possible. Clearly mark and make sure that you can see: Any grab bars or handrails. First and last steps. Where the edge of each step is. Use tools that help you move around (mobility aids) if they are needed. These include: Canes. Walkers. Scooters. Crutches. Turn on the lights when you go into a dark area. Replace any light bulbs as soon as they burn out. Set up your furniture so you have a clear path. Avoid moving your furniture around. If any of your floors are uneven, fix them. If there are any pets around you, be aware of where they are. Review your medicines with your doctor. Some medicines can make you feel dizzy. This can increase your chance of falling. Ask your doctor what other things that you can do to help prevent falls. This information is not intended to replace advice given to you by your health care provider. Make sure you discuss any questions you have with your health care provider. Document Released: 08/02/2009 Document Revised: 03/13/2016 Document Reviewed: 11/10/2014 Elsevier Interactive Patient Education  2017 [employer].

## 2022-11-19 NOTE — Progress Notes (Signed)
Subjective:   Tracey Armstrong is a 88 y.o. female who presents for Medicare Annual (Subsequent) preventive examination.  Review of Systems    Virtual Visit via Telephone Note  I connected with  Tracey Armstrong on 11/19/22 at  2:30 PM EST by telephone and verified that I am speaking with the correct person using two identifiers.  Location: Patient: Home Provider: Office Persons participating in the virtual visit: patient/Nurse Health Advisor   I discussed the limitations, risks, security and privacy concerns of performing an evaluation and management service by telephone and the availability of in person appointments. The patient expressed understanding and agreed to proceed.  Interactive audio and video telecommunications were attempted between this nurse and patient, however failed, due to patient having technical difficulties OR patient did not have access to video capability.  We continued and completed visit with audio only.  Some vital signs may be absent or patient reported.   [name], LPN  Cardiac Risk Factors include: advanced age (>40mn, >>31women);hypertension     Objective:    Today's Vitals   11/19/22 1428  Weight: 161 lb (73 kg)  Height: '5\' 2"'$  (1.575 m)   Body mass index is 29.45 kg/m.     11/19/2022    2:42 PM 09/07/2022   10:19 AM 08/13/2022    9:41 AM 11/25/2021    3:22 PM 11/18/2021    2:50 PM 05/02/2021    3:51 PM 04/30/2021    8:28 AM  Advanced Directives  Does Patient Have a Medical Advance Directive? Yes No Yes Yes Yes Yes Yes  Type of AParamed[place]of [place]ew [place] will    [place] facility DNR (pink MOST or yellow form);Living will [place] of facility DNR (pink MOST or yellow form);Living will  Does patient want to make changes to medical advance directive? No - Patient declined        Copy of HRinerin Chart? Yes - validated most recent copy  scanned in chart (See row information)    Yes - validated most recent copy scanned in chart (See row information)    Would patient like information on creating a medical advance directive?  No - Patient declined         Current Medications (verified) Outpatient Encounter Medications as of 11/19/2022  Medication Sig   ALPRAZolam (XANAX) 1 MG tablet TAKE ONE-HALF TABLET BY MOUTH AT NOON AND 1 EVERY NIGHT AT BEDTIME.   antiseptic oral rinse (BIOTENE) LIQD Use 3 mls 3-4 times per day for dry mouth. Do not swallow.   Cholecalciferol (VITAMIN D3) 1.25 MG (50000 UT) CAPS TAKE 1 CAPSULE BY MOUTH EVERY 14 DAYS   Dextran 70-Hypromellose (ARTIFICIAL TEARS PF OP) Place 1-2 drops into both eyes See admin instructions. Instill 1-2 drops into both eyes at bedtime and an additional 2-3 times a day as needed for dryness   diclofenac (FLECTOR) 1.3 % PTCH Place 1 patch onto the skin 2 (two) times daily.   diphenhydramine-acetaminophen (TYLENOL PM) 25-500 MG TABS tablet Take 1.5 tablets by mouth at bedtime.   gabapentin (NEURONTIN) 300 MG capsule Take 1 capsule (300 mg total) by mouth 3 (three) times daily.   levETIRAcetam (KEPPRA) 500 MG tablet TAKE 1 TABLET(500 MG) BY MOUTH TWICE DAILY   Magnesium 400 MG TABS Take 400 mg by mouth at bedtime.   MAXALT-MLT 10 MG disintegrating tablet DISSOLVE ONE TABLET BY MOUTH AS NEEDED HEADACHE.   Melatonin  10 MG TABS Take 10 mg by mouth at bedtime as needed (for sleep).   omeprazole (PRILOSEC OTC) 20 MG tablet Take 20 mg by mouth daily before breakfast.   POTASSIUM PO Take 1 tablet by mouth daily with lunch.   pyridOXINE (VITAMIN B-6) 100 MG tablet Take 400 mg by mouth daily with lunch.   riboflavin (VITAMIN B-2) 100 MG TABS tablet Take 400 mg by mouth daily with lunch.   tretinoin (RETIN-A) 0.1 % cream APPLY TO AFFECTED AREA EVERY DAY AT BEDTIME (Patient not taking: Reported on 11/05/2022)   verapamil (VERELAN PM) 180 MG 24 hr capsule TAKE 1 CAPSULE(180 MG) BY MOUTH DAILY    No facility-administered encounter medications on file as of 11/19/2022.    Allergies (verified) Dihydrotachysterol, Trazodone and nefazodone, Valium [diazepam], Citalopram, and Other   History: Past Medical History:  Diagnosis Date   Anxiety    Arthritis    Chronic insomnia 03/30/2015   Chronic low back pain 12/02/2016   Depression    GERD (gastroesophageal reflux disease)    Hiatal hernia    High cholesterol    Hypertelorism    Hypertension    Meningioma (HCC)    Migraine    Past Surgical History:  Procedure Laterality Date   FEMUR IM NAIL Left 06/02/2020   Procedure: INTRAMEDULLARY (IM) RETROGRADE FEMORAL NAILING - LEFT;  Surgeon: Rod Can, MD;  Location: [facility];  Service: Orthopedics;  Laterality: Left;   STOMACH SURGERY     Family History  Problem Relation Age of Onset   Heart Problems Mother    Migraines Father    Stroke Father    Epilepsy Brother    Cancer Brother 65       unknown type - metastatic   Social History   Socioeconomic History   Marital status: Widowed    Spouse name: Not on file   Number of children: 4   Years of education: 13   Highest education level: Not on file  Occupational History   Occupation: Retired  Tobacco Use   Smoking status: Never   Smokeless tobacco: Never  Vaping Use   Vaping Use: Never used  Substance and Sexual Activity   Alcohol use: No   Drug use: No   Sexual activity: Not Currently  Other Topics Concern   Not on file  Social History Narrative   Lives in [place] [facility]      Patient drinks 1 glass of tea daily.      Patient is right handed.   Social Determinants of Health   Financial Resource Strain: Low Risk  (11/19/2022)   Overall Financial Resource Strain (CARDIA)    Difficulty of Paying Living Expenses: Not hard at all  Food Insecurity: No Food Insecurity (11/19/2022)   Hunger Vital Sign    Worried About Running Out of Food in the Last Year: Never true    Ran Out of Food in the Last Year: Never  true  Transportation Needs: No Transportation Needs (11/19/2022)   PRAPARE - Hydrologist (Medical): No    Lack of Transportation (Non-Medical): No  Physical Activity: Insufficiently Active (11/19/2022)   Exercise Vital Sign    Days of Exercise per Week: 2 days    Minutes of Exercise per Session: 60 min  Stress: No Stress Concern Present (11/19/2022)   [place]    Feeling of Stress : Not at all  Social Connections: Moderately Integrated (11/19/2022)  Social Licensed conveyancer [NHANES]    Frequency of Communication with Friends and Family: More than three times a week    Frequency of Social Gatherings with Friends and Family: More than three times a week    Attends Religious Services: More than 4 times per year    Active Member of [employer] or Organizations: Yes    Attends Archivist Meetings: More than 4 times per year    Marital Status: Widowed    Tobacco Counseling Counseling given: Not Answered   Clinical Intake:  Pre-visit preparation completed: No  Pain : No/denies pain     BMI - recorded: 29.45 Nutritional Status: BMI 25 -29 Overweight Nutritional Risks: None Diabetes: No  How often do you need to have someone help you when you read instructions, pamphlets, or other written materials from your doctor or pharmacy?: 3 - Sometimes (Aide assist)  Diabetic?  No  Interpreter Needed?: No  Information entered by :: Rolene Arbour LPN   Activities of Daily Living    11/19/2022    2:39 PM  In your present state of health, do you have any difficulty performing the following activities:  Hearing? 0  Vision? 0  Difficulty concentrating or making decisions? 0  Walking or climbing stairs? 0  Dressing or bathing? 0  Doing errands, shopping? 0  Preparing Food and eating ? N  Using the Toilet? N  In the past six months, have you accidently leaked urine? Y   Comment Wears pads and breifs. Followed by PCP  Do you have problems with loss of bowel control? N  Managing your Medications? N  Managing your Finances? N  Housekeeping or managing your Housekeeping? N    Patient Care Team: [place], [name], MD as PCP - General (Family Medicine) Cameron Sprang, MD as Consulting Physician (Neurology) Magnus Sinning, MD as Consulting Physician (Physical Medicine and Rehabilitation) Melissa Noon, [place] as Referring Physician (Optometry)  Indicate any recent Medical Services you may have received from other than [facility] providers in the past year (date may be approximate).     Assessment:   This is a routine wellness examination for Tracey Armstrong.  Hearing/Vision screen Hearing Screening - Comments:: Denies hearing difficulties   Vision Screening - Comments:: Wears rx glasses - up to date with routine eye exams with  Coralie Keens   Dietary issues and exercise activities discussed: Exercise limited by: None identified   Goals Addressed               This Visit's Progress     Patient stated (pt-stated)        I would like to walk walker.        Depression Screen    11/19/2022    2:37 PM 11/05/2022   10:07 AM 10/01/2022    2:54 PM 07/30/2022   11:19 AM 05/14/2022   11:20 AM 02/26/2022   12:22 PM 11/18/2021    2:45 PM  PHQ 2/9 Scores  PHQ - 2 Score 0 3 0 0 0 0 0  PHQ- 9 Score 0 10  0 6      Fall Risk    11/19/2022    2:40 PM 11/05/2022   10:07 AM 10/01/2022    2:54 PM 08/13/2022    9:41 AM 07/30/2022   11:19 AM  Fall Risk   Falls in the past year? 0 0 0 1 0  Number falls in past yr: 0 0 0 0 0  Injury with Fall? 0 0  0 0 0  Risk for fall due to : No Fall Risks  History of fall(s)  History of fall(s)  Follow up Falls prevention discussed  Falls evaluation completed Falls evaluation completed Falls evaluation completed    [place]:  Any stairs in or around the home? Yes  If so, are there any without  handrails? No  Home free of loose throw rugs in walkways, pet beds, electrical cords, etc? Yes  Adequate lighting in your home to reduce risk of falls? Yes   ASSISTIVE DEVICES UTILIZED TO PREVENT FALLS:  Life alert? No  Use of a cane, walker or w/c? Yes  Grab bars in the bathroom? Yes  Shower chair or bench in shower? Yes  Elevated toilet seat or a handicapped toilet? Yes   TIMED UP AND GO:  Was the test performed? No . Audio Visit    Cognitive Function:    04/30/2021    9:00 AM  MMSE - Mini Mental State Exam  Orientation to time 5  Orientation to Place 5  Registration 3  Attention/ Calculation 5  Recall 2  Language- name 2 objects 2  Language- repeat 1  Language- follow 3 step command 3  Language- read & follow direction 1  Write a sentence 1  Copy design 1  Total score 29        11/19/2022    2:42 PM 11/18/2021    2:51 PM  6CIT Screen  What Year? 0 points 0 points  What month? 0 points 0 points  What time? 0 points 0 points  Count back from 20 0 points 0 points  Months in reverse 0 points 2 points  Repeat phrase 0 points 2 points  Total Score 0 points 4 points    Immunizations Immunization History  Administered Date(s) Administered   Tdap 08/30/2014    TDAP status: Up to date  Flu Vaccine status: Declined, Education has been provided regarding the importance of this vaccine but patient still declined. Advised may receive this vaccine at local pharmacy or Health Dept. Aware to provide a copy of the vaccination record if obtained from local pharmacy or Health Dept. Verbalized acceptance and understanding.  Pneumococcal vaccine status: Due, Education has been provided regarding the importance of this vaccine. Advised may receive this vaccine at local pharmacy or Health Dept. Aware to provide a copy of the vaccination record if obtained from local pharmacy or Health Dept. Verbalized acceptance and understanding.  Covid Vaccine Completed  Qualifies for  Shingles Vaccine? Yes   Zostavax completed No   Shingrix Completed?: No.    Education has been provided regarding the importance of this vaccine. Patient has been advised to call insurance company to determine out of pocket expense if they have not yet received this vaccine. Advised may also receive vaccine at local pharmacy or Health Dept. Verbalized acceptance and understanding.  Screening Tests Health Maintenance  Topic Date Due   COVID-19 Vaccine (1) 12/05/2022 (Originally 06/25/1935)   INFLUENZA VACCINE  01/18/2023 (Originally 05/20/2022)   Zoster Vaccines- Shingrix (1 of 2) 02/17/2023 (Originally 12/24/1986)   Pneumonia Vaccine 34+ Years old (1 - PCV) 11/20/2023 (Originally 12/23/2001)   Medicare Annual Wellness (AWV)  11/20/2023   DTaP/Tdap/Td (2 - Td or Tdap) 08/30/2024   DEXA SCAN  Completed   HPV VACCINES  Aged Out    Health Maintenance  There are no preventive care reminders to display for this patient.   Colorectal cancer screening: No longer required.  Mammogram status: No longer required due to Age.  Bone Density status: Completed 01/22/18. Results reflect: Bone density results: OSTEOPOROSIS. Repeat every   years.  Lung Cancer Screening: (Low Dose CT Chest recommended if Age 68-80 years, 30 pack-year currently smoking OR have quit w/in 15years.) does not qualify.     Additional Screening:  Hepatitis C Screening: does not qualify; Completed   Vision Screening: Recommended annual ophthalmology exams for early detection of glaucoma and other disorders of the eye. Is the patient up to date with their annual eye exam?  Yes  Who is the provider or what is the name of the office in which the patient attends annual eye exams? [facility] If pt is not established with a provider, would they like to be referred to a provider to establish care? No .   Dental Screening: Recommended annual dental exams for proper oral hygiene  Community Resource Referral / Chronic Care  Management:  CRR required this visit?  No   CCM required this visit?  No      Plan:     I have personally reviewed and noted the following in the patient's chart:   Medical and social history Use of alcohol, tobacco or illicit drugs  Current medications and supplements including opioid prescriptions. Patient is not currently taking opioid prescriptions. Functional ability and status Nutritional status Physical activity Advanced directives List of other physicians Hospitalizations, surgeries, and ER visits in previous 12 months Vitals Screenings to include cognitive, depression, and falls Referrals and appointments  In addition, I have reviewed and discussed with patient certain preventive protocols, quality metrics, and best practice recommendations. A written personalized care plan for preventive services as well as general preventive health recommendations were provided to patient.     [name], LPN   [id number]/0488   Nurse Notes: None

## 2022-11-25 DIAGNOSIS — R2689 Other abnormalities of gait and mobility: Secondary | ICD-10-CM | POA: Diagnosis not present

## 2022-11-25 DIAGNOSIS — M5459 Other low back pain: Secondary | ICD-10-CM | POA: Diagnosis not present

## 2022-11-25 DIAGNOSIS — M6281 Muscle weakness (generalized): Secondary | ICD-10-CM | POA: Diagnosis not present

## 2022-11-25 DIAGNOSIS — M47896 Other spondylosis, lumbar region: Secondary | ICD-10-CM | POA: Diagnosis not present

## 2022-11-27 ENCOUNTER — Telehealth: Payer: Self-pay | Admitting: Physical Medicine and Rehabilitation

## 2022-11-27 DIAGNOSIS — R2689 Other abnormalities of gait and mobility: Secondary | ICD-10-CM | POA: Diagnosis not present

## 2022-11-27 DIAGNOSIS — M5459 Other low back pain: Secondary | ICD-10-CM | POA: Diagnosis not present

## 2022-11-27 DIAGNOSIS — M6281 Muscle weakness (generalized): Secondary | ICD-10-CM | POA: Diagnosis not present

## 2022-11-27 DIAGNOSIS — M47896 Other spondylosis, lumbar region: Secondary | ICD-10-CM | POA: Diagnosis not present

## 2022-11-27 NOTE — Telephone Encounter (Signed)
Injection in back please call.Marland Kitchen

## 2022-11-28 ENCOUNTER — Other Ambulatory Visit: Payer: Self-pay | Admitting: Physical Medicine and Rehabilitation

## 2022-11-28 DIAGNOSIS — M5416 Radiculopathy, lumbar region: Secondary | ICD-10-CM

## 2022-11-28 DIAGNOSIS — M48062 Spinal stenosis, lumbar region with neurogenic claudication: Secondary | ICD-10-CM

## 2022-11-28 NOTE — Telephone Encounter (Signed)
LVM to return call to clinic.

## 2022-11-28 NOTE — Telephone Encounter (Signed)
Patient called in stating she is having the same pain as before. Please advise

## 2022-12-01 ENCOUNTER — Telehealth: Payer: Self-pay | Admitting: Physical Medicine and Rehabilitation

## 2022-12-01 NOTE — Telephone Encounter (Signed)
Spoke with patient and scheduled injection for 12/10/22

## 2022-12-01 NOTE — Telephone Encounter (Signed)
Patient called asked for a call back as soon as possible to schedule an appointment with Dr. Ernestina Patches. [phone]

## 2022-12-02 DIAGNOSIS — M6281 Muscle weakness (generalized): Secondary | ICD-10-CM | POA: Diagnosis not present

## 2022-12-02 DIAGNOSIS — M47896 Other spondylosis, lumbar region: Secondary | ICD-10-CM | POA: Diagnosis not present

## 2022-12-02 DIAGNOSIS — M5459 Other low back pain: Secondary | ICD-10-CM | POA: Diagnosis not present

## 2022-12-02 DIAGNOSIS — R2689 Other abnormalities of gait and mobility: Secondary | ICD-10-CM | POA: Diagnosis not present

## 2022-12-05 DIAGNOSIS — M6281 Muscle weakness (generalized): Secondary | ICD-10-CM | POA: Diagnosis not present

## 2022-12-05 DIAGNOSIS — R2689 Other abnormalities of gait and mobility: Secondary | ICD-10-CM | POA: Diagnosis not present

## 2022-12-05 DIAGNOSIS — M5459 Other low back pain: Secondary | ICD-10-CM | POA: Diagnosis not present

## 2022-12-05 DIAGNOSIS — M47896 Other spondylosis, lumbar region: Secondary | ICD-10-CM | POA: Diagnosis not present

## 2022-12-08 ENCOUNTER — Ambulatory Visit: Payer: Medicare Other | Admitting: Physical Medicine and Rehabilitation

## 2022-12-10 ENCOUNTER — Ambulatory Visit: Payer: Self-pay

## 2022-12-10 ENCOUNTER — Ambulatory Visit (INDEPENDENT_AMBULATORY_CARE_PROVIDER_SITE_OTHER): Payer: Medicare Other | Admitting: Physical Medicine and Rehabilitation

## 2022-12-10 VITALS — BP 129/80 | HR 78

## 2022-12-10 DIAGNOSIS — M5416 Radiculopathy, lumbar region: Secondary | ICD-10-CM | POA: Diagnosis not present

## 2022-12-10 MED ORDER — METHYLPREDNISOLONE ACETATE 80 MG/ML IJ SUSP
80.0000 mg | Freq: Once | INTRAMUSCULAR | Status: AC
  Administered 2022-12-10: 80 mg

## 2022-12-10 NOTE — Patient Instructions (Signed)

## 2022-12-10 NOTE — Progress Notes (Signed)
Functional Pain Scale - descriptive words and definitions  Uncomfortable (3)  Pain is present but can complete all ADL's/sleep is slightly affected and passive distraction only gives marginal relief. Mild range order  Average Pain  varies   +Driver, -BT, -Dye Allergies.  Lower back pain with radiation into left hip

## 2022-12-11 ENCOUNTER — Ambulatory Visit: Payer: Medicare Other | Admitting: Physician Assistant

## 2022-12-15 ENCOUNTER — Ambulatory Visit (INDEPENDENT_AMBULATORY_CARE_PROVIDER_SITE_OTHER): Payer: Medicare Other | Admitting: Physician Assistant

## 2022-12-15 ENCOUNTER — Encounter: Payer: Self-pay | Admitting: Physician Assistant

## 2022-12-15 DIAGNOSIS — M1711 Unilateral primary osteoarthritis, right knee: Secondary | ICD-10-CM | POA: Diagnosis not present

## 2022-12-15 DIAGNOSIS — M1712 Unilateral primary osteoarthritis, left knee: Secondary | ICD-10-CM | POA: Diagnosis not present

## 2022-12-15 MED ORDER — LIDOCAINE HCL 1 % IJ SOLN
3.0000 mL | INTRAMUSCULAR | Status: AC | PRN
  Administered 2022-12-15: 3 mL

## 2022-12-15 MED ORDER — METHYLPREDNISOLONE ACETATE 40 MG/ML IJ SUSP
40.0000 mg | INTRAMUSCULAR | Status: AC | PRN
  Administered 2022-12-15: 40 mg via INTRA_ARTICULAR

## 2022-12-15 NOTE — Progress Notes (Signed)
   Procedure Note  Patient: Tracey Armstrong             Date of Birth: [birth year]           MRN: [id number]             Visit Date: 12/15/2022 HPI: Tracey Armstrong returns today requesting bilateral knee injections.  She states that left knee injection which was given on 08/11/2022 did well until just recently.  Also at that time she had a trochanteric injection on the left states left hip is doing well.  Right knee was last injected 05/19/2022 and just bothers her to some.  She has known arthritis of both knees.  Denies any fevers chills.  Denies any injury to either knee.  Currently the left knee is more bothersome than the right.  Physical exam: General well-developed well-nourished female who ambulates with a rolling walker.  She is able to get on and off the exam table in the room. Bilateral knees: Good range of motion of both knees no abnormal warmth erythema or effusion.  Procedures: Visit Diagnoses:  1. Primary osteoarthritis of right knee   2. Unilateral primary osteoarthritis, left knee     Large Joint Inj: bilateral knee on 12/15/2022 11:00 AM Indications: pain Details: 22 G 1.5 in needle, anterolateral approach  Arthrogram: No  Medications (Right): 3 mL lidocaine 1 %; 40 mg methylPREDNISolone acetate 40 MG/ML Medications (Left): 3 mL lidocaine 1 %; 40 mg methylPREDNISolone acetate 40 MG/ML Outcome: tolerated well, no immediate complications Procedure, treatment alternatives, risks and benefits explained, specific risks discussed. Consent was given by the patient. Immediately prior to procedure a time out was called to verify the correct patient, procedure, equipment, support staff and site/side marked as required. Patient was prepped and draped in the usual sterile fashion.    Plan: She knows to wait at least 3 months between injections.  She will follow-up with [place] as needed.  Questions encouraged and answered.

## 2022-12-16 ENCOUNTER — Other Ambulatory Visit: Payer: Self-pay | Admitting: Family Medicine

## 2022-12-16 DIAGNOSIS — F411 Generalized anxiety disorder: Secondary | ICD-10-CM

## 2022-12-16 NOTE — Progress Notes (Unsigned)
Tracey Armstrong - 88 y.o. female MRN [id number]  Date of birth: December 19, 1934  Office Visit Note: Visit Date: 12/10/2022 PCP: Martinique, [name], MD Referred by: Martinique, [name], MD  Subjective: Chief Complaint  Patient presents with   Lower Back - Pain   HPI:  Tracey Armstrong is a 88 y.o. female who comes in today for planned repeat Bilateral L4-5  Lumbar Transforaminal epidural steroid injection with fluoroscopic guidance.  The patient has failed conservative care including home exercise, medications, time and activity modification.  This injection will be diagnostic and hopefully therapeutic.  Please see requesting physician notes for further details and justification. Patient received more than 50% pain relief from prior injection.   Referring: Barnet Pall, FNP   ROS Otherwise per HPI.  Assessment & Plan: Visit Diagnoses:    ICD-10-CM   1. Lumbar radiculopathy  M54.16 XR C-ARM NO REPORT    Epidural Steroid injection    methylPREDNISolone acetate (DEPO-MEDROL) injection 80 mg      Plan: No additional findings.   Meds & Orders:  Meds ordered this encounter  Medications   methylPREDNISolone acetate (DEPO-MEDROL) injection 80 mg    Orders Placed This Encounter  Procedures   XR C-ARM NO REPORT   Epidural Steroid injection    Follow-up: No follow-ups on file.   Procedures: No procedures performed  Lumbosacral Transforaminal Epidural Steroid Injection - Sub-Pedicular Approach with Fluoroscopic Guidance  Patient: Tracey Armstrong      Date of Birth: August 29, 1935 MRN: [id number] PCP: Martinique, [name], MD      Visit Date: 12/10/2022   Universal Protocol:    Date/Time: 12/10/2022  Consent Given By: the patient  Position: PRONE  Additional Comments: Vital signs were monitored before and after the procedure. Patient was prepped and draped in the usual sterile fashion. The correct patient, procedure, and site was verified.   Injection Procedure Details:   Procedure diagnoses:  Lumbar radiculopathy [M54.16]    Meds Administered:  Meds ordered this encounter  Medications   methylPREDNISolone acetate (DEPO-MEDROL) injection 80 mg    Laterality: Bilateral  Location/Site: L4  Needle:5.0 in., 22 ga.  Short bevel or Quincke spinal needle  Needle Placement: Transforaminal  Findings:    -Comments: Excellent flow of contrast along the nerve, nerve root and into the epidural space.  Procedure Details: After squaring off the end-plates to get a true AP view, the C-arm was positioned so that an oblique view of the foramen as noted above was visualized. The target area is just inferior to the "nose of the scotty dog" or sub pedicular. The soft tissues overlying this structure were infiltrated with 2-3 ml. of 1% Lidocaine without Epinephrine.  The spinal needle was inserted toward the target using a "trajectory" view along the fluoroscope beam.  Under AP and lateral visualization, the needle was advanced so it did not puncture dura and was located close the 6 O'Clock position of the pedical in AP tracterory. Biplanar projections were used to confirm position. Aspiration was confirmed to be negative for CSF and/or blood. A 1-2 ml. volume of Isovue-250 was injected and flow of contrast was noted at each level. Radiographs were obtained for documentation purposes.   After attaining the desired flow of contrast documented above, a 0.5 to 1.0 ml test dose of 0.25% Marcaine was injected into each respective transforaminal space.  The patient was observed for 90 seconds post injection.  After no sensory deficits were reported, and normal lower extremity motor function was noted,  the above injectate was administered so that equal amounts of the injectate were placed at each foramen (level) into the transforaminal epidural space.   Additional Comments:  No complications occurred Dressing: 2 x 2 sterile gauze and Band-Aid    Post-procedure details: Patient was observed during  the procedure. Post-procedure instructions were reviewed.  Patient left the clinic in stable condition.    Clinical History: CT LUMBAR SPINE WITHOUT CONTRAST   TECHNIQUE:  Multidetector CT imaging of the lumbar spine was performed without  intravenous contrast administration. Multiplanar CT image  reconstructions were also generated.   COMPARISON:  Lumbar spine radiographs 04/04/2020 and MRI 09/09/2018   FINDINGS:  Segmentation: 5 lumbar type vertebrae.   Alignment: Mild thoracolumbar levoscoliosis. Grade 1 anterolisthesis  of L4 on L5, unchanged from the prior MRI.   Vertebrae: L1 superior endplate compression fracture with 30%  vertebral body height loss and 4 mm retropulsion of the superior  endplate. A fracture line remains visible although there is  surrounding sclerosis. Hemangioma in the L4 vertebral body.   Paraspinal and other soft tissues: Aortic atherosclerosis without  aneurysm.   Disc levels: Disc bulging and posterior element hypertrophy result  in chronic mild-to-moderate spinal stenosis at L3-4 and moderate to  severe spinal stenosis at L4-5. No significant spinal stenosis  related to mild L1 retropulsion.   IMPRESSION:  1. Possibly subacute L1 compression fracture with 30% height loss  and 4 mm retropulsion. No associated spinal stenosis.  2. Chronic mild-to-moderate spinal stenosis at L3-4 and moderate to  severe spinal stenosis at L4-5.  3. Aortic Atherosclerosis (ICD10-I70.0).    Electronically Signed    By: Logan Bores M.D.    On: 04/04/2020 17:39     Objective:  VS:  HT:    WT:   BMI:     BP:129/80  HR:78bpm  TEMP: ( )  RESP:  Physical Exam   Imaging: No results found.

## 2022-12-16 NOTE — Procedures (Signed)
Lumbosacral Transforaminal Epidural Steroid Injection - Sub-Pedicular Approach with Fluoroscopic Guidance  Patient: Tracey Armstrong      Date of Birth: 07-Dec-1934 MRN: [id number] PCP: Martinique, [name], MD      Visit Date: 12/10/2022   Universal Protocol:    Date/Time: 12/10/2022  Consent Given By: the patient  Position: PRONE  Additional Comments: Vital signs were monitored before and after the procedure. Patient was prepped and draped in the usual sterile fashion. The correct patient, procedure, and site was verified.   Injection Procedure Details:   Procedure diagnoses: Lumbar radiculopathy [M54.16]    Meds Administered:  Meds ordered this encounter  Medications   methylPREDNISolone acetate (DEPO-MEDROL) injection 80 mg    Laterality: Bilateral  Location/Site: L4  Needle:5.0 in., 22 ga.  Short bevel or Quincke spinal needle  Needle Placement: Transforaminal  Findings:    -Comments: Excellent flow of contrast along the nerve, nerve root and into the epidural space.  Procedure Details: After squaring off the end-plates to get a true AP view, the C-arm was positioned so that an oblique view of the foramen as noted above was visualized. The target area is just inferior to the "nose of the scotty dog" or sub pedicular. The soft tissues overlying this structure were infiltrated with 2-3 ml. of 1% Lidocaine without Epinephrine.  The spinal needle was inserted toward the target using a "trajectory" view along the fluoroscope beam.  Under AP and lateral visualization, the needle was advanced so it did not puncture dura and was located close the 6 O'Clock position of the pedical in AP tracterory. Biplanar projections were used to confirm position. Aspiration was confirmed to be negative for CSF and/or blood. A 1-2 ml. volume of Isovue-250 was injected and flow of contrast was noted at each level. Radiographs were obtained for documentation purposes.   After attaining the desired  flow of contrast documented above, a 0.5 to 1.0 ml test dose of 0.25% Marcaine was injected into each respective transforaminal space.  The patient was observed for 90 seconds post injection.  After no sensory deficits were reported, and normal lower extremity motor function was noted,   the above injectate was administered so that equal amounts of the injectate were placed at each foramen (level) into the transforaminal epidural space.   Additional Comments:  No complications occurred Dressing: 2 x 2 sterile gauze and Band-Aid    Post-procedure details: Patient was observed during the procedure. Post-procedure instructions were reviewed.  Patient left the clinic in stable condition.

## 2022-12-16 NOTE — Telephone Encounter (Signed)
Prescription Request  12/16/2022  Is this a "Controlled Substance" medicine? Yes  LOV: 10/01/2022 What is the name of the medication or equipment? ALPRAZolam Duanne Moron) 1 MG tablet   Have you contacted your pharmacy to request a refill? No   Which pharmacy would you like this sent to?  [facility] [place], [place] AT [facility] [address] [place] [address] Phone: [phone] Fax: [phone]    Patient notified that their request is being sent to the clinical staff for review and that they should receive a response within 2 business days.   Please advise at Mobile [phone] (mobile)

## 2022-12-17 MED ORDER — ALPRAZOLAM 1 MG PO TABS: ORAL_TABLET | ORAL | 3 refills | Status: DC

## 2022-12-22 ENCOUNTER — Other Ambulatory Visit: Payer: Self-pay | Admitting: Neurology

## 2022-12-29 ENCOUNTER — Encounter: Payer: Medicare Other | Admitting: Physical Medicine and Rehabilitation

## 2023-01-14 ENCOUNTER — Other Ambulatory Visit: Payer: Self-pay | Admitting: Family Medicine

## 2023-01-14 ENCOUNTER — Telehealth: Payer: Self-pay | Admitting: Physical Medicine and Rehabilitation

## 2023-01-14 DIAGNOSIS — I10 Essential (primary) hypertension: Secondary | ICD-10-CM

## 2023-01-14 NOTE — Telephone Encounter (Signed)
Patient asking  Tracey Armstrong to call ASAP

## 2023-01-15 NOTE — Telephone Encounter (Signed)
IC patient she wanted you to know that Dr Ernestina Patches is still her doctor even though someone else is sending her for another opinion due to recent hospital stay.

## 2023-01-21 ENCOUNTER — Encounter
Payer: Medicare Other | Attending: Physical Medicine and Rehabilitation | Admitting: Physical Medicine and Rehabilitation

## 2023-01-21 VITALS — BP 137/83 | HR 82 | Ht 62.0 in | Wt 164.8 lb

## 2023-01-21 DIAGNOSIS — M47816 Spondylosis without myelopathy or radiculopathy, lumbar region: Secondary | ICD-10-CM | POA: Diagnosis not present

## 2023-01-21 DIAGNOSIS — M1712 Unilateral primary osteoarthritis, left knee: Secondary | ICD-10-CM | POA: Diagnosis not present

## 2023-01-21 DIAGNOSIS — G894 Chronic pain syndrome: Secondary | ICD-10-CM | POA: Insufficient documentation

## 2023-01-21 DIAGNOSIS — M5416 Radiculopathy, lumbar region: Secondary | ICD-10-CM | POA: Insufficient documentation

## 2023-01-21 MED ORDER — GABAPENTIN 300 MG PO CAPS: ORAL_CAPSULE | ORAL | 2 refills | Status: DC

## 2023-01-21 NOTE — Patient Instructions (Signed)
Remove your afternoon dose of gabapentin and only take it just before bed.  Start with 2 capsules at bedtime, and after 2 weeks call me in clinic and let me know if you are having any effect from this medication.  At that time, if needed, we can increase it to 3 capsules at nighttime.  I will want to hear from you in 2 weeks regardless of effective medication.  Please call the office or message me through [place].  Please return to Dr. Ernestina Patches for epidural steroid injections.  I will also message him through the chart as well.  If the effectiveness of these are decreasing, it may be appropriate to consider a neurosurgical referral.  I have sent a prescription back to PT at [facility], to help with your pain control mobility.  Please ensure that any home exercise program that they prescribe, you are able to perform in your home.  I have also mentioned this in the referral.  I will want to see you back in 1 to 2 months to see how things are going.  If pain continues to be severe, and there are no improvements with the above regimen, we may consider a pain contract and low-dose opiate prescription at that time; however, as stated during this visit, that is the last option for your pain control and you will need to be compliant with your current medications before we will take that route.

## 2023-01-21 NOTE — Progress Notes (Signed)
Subjective:    Patient ID: Tracey Armstrong, female    DOB: [birth year], 88 y.o.   MRN: [id number]  HPI  Tracey Armstrong is a 88 y.o. year old female  who  has a past medical history of Anxiety, Arthritis, Chronic insomnia (03/30/2015), Chronic low back pain (12/02/2016), Depression, GERD (gastroesophageal reflux disease), Hiatal hernia, High cholesterol, Hypertelorism, Hypertension, Meningioma ([place]), and Migraine.   They are presenting to PM&R clinic for follow up related to chronic low back pain .  Plan from last visit: Chronic pain disorder Assessment & Plan: Back exam is consistent with local pain from L1 chronic compression fracture, L lumbosacral radiculopathy and L>R facet arthropathy. Description of back pain (worse localized than radiating, worse with movement after prolonged laying/sitting/standing, improvement with activity) is more consistent with arthritic pain. Prior imaging also noted moderate to severe facet arthropathy in lower lumbar spine (L4-5, L5-S1).    Additional findings include bilateral pes anserine bursitis and L greater trochanteric bursitis .   Given Hx fatigue with use of low-dose Norco 5 mg tablets, along with concurrent use of Xanax for sleep, feel chronic narcotic pain management is inappropriate for this patient at this time, as it would incur an undue risk of falling with high likelihood of recurrent injury.    Will manage with non-narcotic medications and interventions for now. Can re-evaluate at upcoming visits. Follow up in 1 month.      Lumbar facet arthropathy Assessment & Plan: Feel this is her primary pain generator and limitation for improved mobility at this time.    Will inquire with Dr. Romona Curls office if they would consider and perform medial branch blocks/ablation. If not offered at their practice, can refer to Dr. Letta Pate at our office.   Advised patient that single daily Ibuprofen use is low risk for recurrent GI bleed, and she may try this with  tylenol for AM stiffness and arthritic pains.      Lumbar radiculopathy Assessment & Plan: Can continue ESI with Dr. Ernestina Patches as long as she is getting good benefit.   Increased gabapentin from 300 mg once daily to 300 mg TID for pain control. Patient reports she has tolerated this dosing in the past without side effects. She is to contact clinic in 1-2 weeks after starting medication to report effect.          Closed wedge compression fracture of L1 vertebra, initial encounter [facility]) Assessment & Plan: Per most recent CT 09/19/22: Progression of height loss at L1 vertebral fracture now with greater than 75% central height loss. There is 5 mm retropulsion of the posterosuperior corner.   Managed nonoperatively due to concern for anesthesia risk. Does not seem to be primary pain generator at this time, but may consider second opinion for vertebroplasty if she develops further symptoms.      Seizures ([place]) Assessment & Plan: Has been stable on medication for several years.   Discussed that with increased gabapentin, will not increase seizure risk, although will advise tapering medication if patient has to wean after being on an increased dose for an extended period.   Would avoid Tramadol due to seizure risk     Personal history of fall Assessment & Plan: Recommended participation in an aerobic exercise class at her facility in [place] 1-2x weekly as tolerated, in addition to ongoing PT, as this is the only intervention known to reduce risk of falling.    Interval Hx:  - Therapies: none; her PT script ran out and he  states she met all of her goals; states they did not give her a HEP, just asked her to walk.    - Follow ups: Bilateral knee injections 12/15/22  - follow up with Dr. Alvester Morin, Gave back injection 11/09/22 - was not helpful   - Falls: Had a seizure, fell, and broke her left leg 2-3 years ago; her left leg and hip have hurt since. No falls or seizures recently.    -  ZOX:WRUE rollating walker.    - Medications: She did increase the gabapentin from once to twice a day; never increased it to three because "I'm on too many pills". Did endorse GI upset around that time but has extensive Hx of GI upset with prior GI surgery.   - can't take Motrin because it gave her headaches - Hydrocodone long time ago; no side effects. Stopped because "they wont write a prescription for it anymore". Caregiver states it was a short course when she was in the hospital. Noted last time hx fatigue.    - Other concerns: CMP 10/23 GFR 58; LFTs WNL  Currently, pain is worst in the L knee, hips, and low back. Back pain is stabbing, worse with laying, and L foot burning/tingling pain whic wakes her up at nighttime, improves with standing  Pain Inventory Average Pain 10 Pain Right Now 10 My pain is burning, stabbing, aching, and hot  In the last 24 hours, has pain interfered with the following? General activity 9 Relation with others 9 Enjoyment of life 10 What TIME of day is your pain at its worst? morning , daytime, evening, and night Sleep (in general) Poor  Pain is worse with: bending, sitting, inactivity, standing, and some activites Pain improves with: rest Relief from Meds: 1  Family History  Problem Relation Age of Onset   Heart Problems Mother    Migraines Father    Stroke Father    Epilepsy Brother    Cancer Brother 53       unknown type - metastatic   Social History   Socioeconomic History   Marital status: Widowed    Spouse name: Not on file   Number of children: 4   Years of education: 72   Highest education level: Not on file  Occupational History   Occupation: Retired  Tobacco Use   Smoking status: Never   Smokeless tobacco: Never  Vaping Use   Vaping Use: Never used  Substance and Sexual Activity   Alcohol use: No   Drug use: No   Sexual activity: Not Currently  Other Topics Concern   Not on file  Social History Narrative   Lives in  [place] [facility]      Patient drinks 1 glass of tea daily.      Patient is right handed.   Social Determinants of Health   Financial Resource Strain: Low Risk  (11/19/2022)   Overall Financial Resource Strain (CARDIA)    Difficulty of Paying Living Expenses: Not hard at all  Food Insecurity: No Food Insecurity (11/19/2022)   Hunger Vital Sign    Worried About Running Out of Food in the Last Year: Never true    Ran Out of Food in the Last Year: Never true  Transportation Needs: No Transportation Needs (11/19/2022)   PRAPARE - Administrator, Civil Service (Medical): No    Lack of Transportation (Non-Medical): No  Physical Activity: Insufficiently Active (11/19/2022)   Exercise Vital Sign    Days of Exercise per  Week: 2 days    Minutes of Exercise per Session: 60 min  Stress: No Stress Concern Present (11/19/2022)   [employer] of Occupational Health - Occupational Stress Questionnaire    Feeling of Stress : Not at all  Social Connections: Moderately Integrated (11/19/2022)   Social Connection and Isolation Panel [NHANES]    Frequency of Communication with Friends and Family: More than three times a week    Frequency of Social Gatherings with Friends and Family: More than three times a week    Attends Religious Services: More than 4 times per year    Active Member of [employer] or Organizations: Yes    Attends Banker Meetings: More than 4 times per year    Marital Status: Widowed   Past Surgical History:  Procedure Laterality Date   FEMUR IM NAIL Left 06/02/2020   Procedure: INTRAMEDULLARY (IM) RETROGRADE FEMORAL NAILING - LEFT;  Surgeon: Samson Frederic, MD;  Location: [facility] OR;  Service: Orthopedics;  Laterality: Left;   STOMACH SURGERY     Past Surgical History:  Procedure Laterality Date   FEMUR IM NAIL Left 06/02/2020   Procedure: INTRAMEDULLARY (IM) RETROGRADE FEMORAL NAILING - LEFT;  Surgeon: Samson Frederic, MD;  Location: [facility] OR;  Service:  Orthopedics;  Laterality: Left;   STOMACH SURGERY     Past Medical History:  Diagnosis Date   Anxiety    Arthritis    Chronic insomnia 03/30/2015   Chronic low back pain 12/02/2016   Depression    GERD (gastroesophageal reflux disease)    Hiatal hernia    High cholesterol    Hypertelorism    Hypertension    Meningioma ([facility])    Migraine    BP 137/83   Pulse 82   Ht 5\' 2"  (1.575 m)   Wt 164 lb 12.8 oz (74.8 kg)   SpO2 94%   BMI 30.14 kg/m   Opioid Risk Score:   Fall Risk Score:  `1  Depression screen [facility] 2/9     11/19/2022    2:37 PM 11/05/2022   10:07 AM 10/01/2022    2:54 PM 07/30/2022   11:19 AM 05/14/2022   11:20 AM 02/26/2022   12:22 PM 11/18/2021    2:45 PM  Depression screen PHQ 2/9  Decreased Interest 0 3 0 0 0 0 0  Down, Depressed, Hopeless 0 0 0 0 0 0 0  PHQ - 2 Score 0 3 0 0 0 0 0  Altered sleeping 0 3  0 2    Tired, decreased energy 0 3  0 2    Change in appetite 0 1  0 0    Feeling bad or failure about yourself  0 0  0 0    Trouble concentrating 0 0  0 2    Moving slowly or fidgety/restless 0 0  0 0    Suicidal thoughts 0 0  0 0    PHQ-9 Score 0 10  0 6    Difficult doing work/chores    Not difficult at all Somewhat difficult        Review of Systems  Musculoskeletal:  Positive for back pain.       Left leg pain Right knee pain  All other systems reviewed and are negative.     Objective:   Physical Exam   PE: Constitution: Appropriate appearance for age. No apparent distress   Resp: No respiratory distress. No accessory muscle usage. on RA Cardio: Well perfused appearance. 1+ bilateral peripheral edema.  Abdomen: Nondistended. Nontender.   Psych: Appropriate mood and affect. Neuro: AAOx4.  + Memory deficits, poor recall of medical Hx. Caregiver assists in this.   Neurologic Exam:   DTRs: Reflexes were 2+ in bilateral achilles, patella, biceps, BR and triceps. Babinsky: flexor responses b/l.   Hoffmans: negative b/l Sensory exam:  revealed normal sensation in all dermatomal regions in bilateral lower extremities Motor exam: strength 5/5 throughout bilateral lower extremities and with exception of 4+/5 bil;ateral hip flexors and hip abductors Coordination: Fine motor coordination was normal.   Gait:  Forward flexed, shuffling, with use of RW. Adequate stability, decreased stride length.     Back Exam:   Inspection: Pelvis was even.  +Increased throacic kyphosis. There was no evidence of scoliosis.  Palpation: Palpatory exam revealed ttp at the bilateral paraspinals, latissimus dorsi, PSIS, lateral hips and lateral thighs . There was  no evidence of spasm.   ROM revealed restricted ROM in flexion,extension, and rotation bilaterally . Special/provocative testing:    Slump test: +    Facet loading: +(very non-specific)   TTP at paraspinals: +(sensitive for facet pain...if no ttp then likely not facet pain)   Pearlean Brownie test: -  -had to perform while seated for tolerance  Knee Exam: L knee Inspection: No gross deformities seen.  No effusion present.  No erythema present.  No evidence of Genu Valgum/Varum or Recurvatum.   No effusion present along joint line or along bursal sac with tightening of quad muscle (laying supine).  Palpation: No increased warmth around joint. Notable tenderness at Quadriceps Tendon, Patella superiolaterally, and Medial Joint line. No crepitus present.    ROM:  Flexion  (135*), Extension  (0*).  No hyperextension noted .  Special/Provacative testing:  Clarks (hold patella down, tighten quad; patellofemoral syndrome) - - Patellar Grind Test - - Anterior Drawer Sign - -   Posterior Drawer Sign - - McMurrays (Meniscus)- -   Valgus Stress (Collateral Lig) joint should not open, if does open then MCL and ACL injury - - Varus Stress (Collateral Lig) - -  Information in () parenthesis is normals/details of specific exam.       Assessment & Plan:   Tracey Armstrong is a 88 y.o. year old female   who  has a past medical history of Anxiety, Arthritis, Chronic insomnia (03/30/2015), Chronic low back pain (12/02/2016), Depression, GERD (gastroesophageal reflux disease), Hiatal hernia, High cholesterol, Hypertelorism, Hypertension, Meningioma ([facility]), and Migraine.   They are presenting to PM&R clinic for follow up related to chronic low back pain with bilateral leg pain and weakness.  Lumbar facet arthropathy -     Ambulatory referral to Physical Therapy Discussed MBB with Dr. Alvester Morin; available through his office if patient wishes to pursue, she has historically done well with ESI so he has continued these   Lumbar radiculopathy -     Ambulatory referral to Physical Therapy Remove your afternoon dose of gabapentin and only take it just before bed.  Start with 2 capsules at bedtime, and after 2 weeks call me in clinic and let me know if you are having any effect from this medication.  At that time, if needed, we can increase it to 3 capsules at nighttime.I will want to hear from you in 2 weeks regardless of effective medication.  Please call the office or message me through MyChart.  Please return to Dr. Alvester Morin for epidural steroid injections (last 2/21).  I will also message him through the chart as well.  If the  effectiveness of these are decreasing, it may be appropriate to consider a neurosurgical referral.  Chronic pain disorder -     Ambulatory referral to Physical Therapy  I have sent a prescription back to PT at [facility], to help with your pain control mobility.  Please ensure that any home exercise program that they prescribe, you are able to perform in your home.  I have also mentioned this in the referral.  I will want to see you back in 1 to 2 months to see how things are going.  If pain continues to be severe, and there are no improvements with the above regimen, we may consider a pain contract and low-dose opiate prescription at that time; however, as stated during this visit,  that is the last option for your pain control and you will need to be compliant with your current medications before we will take that route.  Unilateral primary osteoarthritis, left knee Has done steroid injections in the past, most recently 2/26 Can continue these via ortho; did discuss Simviisc or Orthovisc as options as well  Other orders -     Gabapentin; Take 2 capsules at nighttime for 2 weeks. If additional control needed, can increase to 3 capsules at nighttime.  Dispense: 90 capsule; Refill: 2      Time spent on chart review (notes) , discussing prior treatment plan, exam, development of treatment plan and patient education on importance of medication compliance as prescribed and discussing possible etiologies of her pain and appropriate treatment of each took approximately 49 minutes on day of service.

## 2023-01-28 DIAGNOSIS — M5459 Other low back pain: Secondary | ICD-10-CM | POA: Diagnosis not present

## 2023-01-28 DIAGNOSIS — R2689 Other abnormalities of gait and mobility: Secondary | ICD-10-CM | POA: Diagnosis not present

## 2023-01-28 DIAGNOSIS — M5416 Radiculopathy, lumbar region: Secondary | ICD-10-CM | POA: Diagnosis not present

## 2023-01-28 DIAGNOSIS — M47816 Spondylosis without myelopathy or radiculopathy, lumbar region: Secondary | ICD-10-CM | POA: Diagnosis not present

## 2023-01-28 DIAGNOSIS — G894 Chronic pain syndrome: Secondary | ICD-10-CM | POA: Diagnosis not present

## 2023-01-28 DIAGNOSIS — M25552 Pain in left hip: Secondary | ICD-10-CM | POA: Diagnosis not present

## 2023-01-30 ENCOUNTER — Other Ambulatory Visit: Payer: Self-pay | Admitting: Family Medicine

## 2023-01-30 DIAGNOSIS — N393 Stress incontinence (female) (male): Secondary | ICD-10-CM

## 2023-01-30 DIAGNOSIS — E559 Vitamin D deficiency, unspecified: Secondary | ICD-10-CM

## 2023-01-30 MED ORDER — VITAMIN D3 1.25 MG (50000 UT) PO CAPS: ORAL_CAPSULE | ORAL | 1 refills | Status: DC

## 2023-01-30 NOTE — Telephone Encounter (Signed)
Patient would like to restart medication, okay to refill? She has seen the urologist, but they weren't much help.

## 2023-02-03 DIAGNOSIS — G894 Chronic pain syndrome: Secondary | ICD-10-CM | POA: Diagnosis not present

## 2023-02-03 DIAGNOSIS — R2689 Other abnormalities of gait and mobility: Secondary | ICD-10-CM | POA: Diagnosis not present

## 2023-02-03 DIAGNOSIS — M5459 Other low back pain: Secondary | ICD-10-CM | POA: Diagnosis not present

## 2023-02-03 DIAGNOSIS — M5416 Radiculopathy, lumbar region: Secondary | ICD-10-CM | POA: Diagnosis not present

## 2023-02-03 DIAGNOSIS — M25552 Pain in left hip: Secondary | ICD-10-CM | POA: Diagnosis not present

## 2023-02-03 DIAGNOSIS — M47816 Spondylosis without myelopathy or radiculopathy, lumbar region: Secondary | ICD-10-CM | POA: Diagnosis not present

## 2023-02-05 DIAGNOSIS — M47816 Spondylosis without myelopathy or radiculopathy, lumbar region: Secondary | ICD-10-CM | POA: Diagnosis not present

## 2023-02-05 DIAGNOSIS — M25552 Pain in left hip: Secondary | ICD-10-CM | POA: Diagnosis not present

## 2023-02-05 DIAGNOSIS — M5416 Radiculopathy, lumbar region: Secondary | ICD-10-CM | POA: Diagnosis not present

## 2023-02-05 DIAGNOSIS — R2689 Other abnormalities of gait and mobility: Secondary | ICD-10-CM | POA: Diagnosis not present

## 2023-02-05 DIAGNOSIS — G894 Chronic pain syndrome: Secondary | ICD-10-CM | POA: Diagnosis not present

## 2023-02-05 DIAGNOSIS — M5459 Other low back pain: Secondary | ICD-10-CM | POA: Diagnosis not present

## 2023-02-09 DIAGNOSIS — M47816 Spondylosis without myelopathy or radiculopathy, lumbar region: Secondary | ICD-10-CM | POA: Diagnosis not present

## 2023-02-09 DIAGNOSIS — M5459 Other low back pain: Secondary | ICD-10-CM | POA: Diagnosis not present

## 2023-02-09 DIAGNOSIS — M25552 Pain in left hip: Secondary | ICD-10-CM | POA: Diagnosis not present

## 2023-02-09 DIAGNOSIS — M5416 Radiculopathy, lumbar region: Secondary | ICD-10-CM | POA: Diagnosis not present

## 2023-02-09 DIAGNOSIS — G894 Chronic pain syndrome: Secondary | ICD-10-CM | POA: Diagnosis not present

## 2023-02-09 DIAGNOSIS — R2689 Other abnormalities of gait and mobility: Secondary | ICD-10-CM | POA: Diagnosis not present

## 2023-02-10 NOTE — Progress Notes (Signed)
HPI: Tracey Armstrong is a 88 y.o. female, who is here today for chronic disease management.  Last seen on 10/01/22 Since her last visit she has seen her neurosurgeon and orthopedics. She underwent epidural lumbar injection on 10/09/2022. She also established with pain management.  Chronic pain, specially back and hips. Lower back pain is severe and radiated to LLE. She has some concerns regarding her current medication regimen and overall health status.   She reports deviating from Dr. Marina Gravel recommendation to take four gabapentin at night along with her 500 mg of generic Keppra due to potential interaction concerns. Instead, she takes one gabapentin 300 mg in the morning and two at noon. She doe snot feel she needs to take Gabapentin at night because pain does not interferes with her sleep.  Seizure disorder, migraine headaches,and meningioma last seen on brain MRI 03/2021. She follows with neurologist. Reports that headaches happen infrequently and has not had any seizure episode since she has been on Levitiracetam 500 mg bid.  Anxiety and insomnia: She is currently on alprazolam 1 mg, which she has been instructed to take 1 to 2 tablets/day and after 45 tablets/month. Requesting refills. She has not tolerated SSRI's and SNRI's in the past, they all aggravated headaches.  She takes Tylenol PM and Melatonin at night to aid sleep. Her sleep pattern involves going to bed around 10:30 PM and waking at 9:00 AM, with occasional interruptions due to frequent urination.  She expresses a desire to resume oxybutynin, having noticed its benefits before discontinuation.  + Urgency, sometimes she has episodes of urine incontinence. Negative for dysuria or gross hematuria. She was tolerating Oxybutynin well, does not recall side effects.  Hypertension: She is currently on Verapamil 180 mg daily Negative for unusual or severe headache, visual changes, exertional chest pain, dyspnea,  focal  weakness, or edema.  Lab Results  Component Value Date   CREATININE 0.90 07/30/2022   BUN 11 07/30/2022   NA 136 07/30/2022   K 4.4 07/30/2022   CL 100 07/30/2022   CO2 30 07/30/2022   Aortic atherosclerosis was seen on imaging in 09/2022. She stopped Pravastatin because side effects. She states that she is willing to try again. Lab Results  Component Value Date   CHOL 207 (H) 04/12/2021   HDL 70 04/12/2021   LDLCALC 121 (H) 04/12/2021   TRIG 80 04/12/2021   CHOLHDL 3.0 04/12/2021   Today she is also requesting prescription for Ammonium Lactate 12% to treat dry scaly skin. She has been using it and thinks it has helped. Negative for erythematous rash or pruritus.  Review of Systems  Constitutional:  Negative for activity change, appetite change, chills and fever.  Respiratory:  Negative for cough and wheezing.   Gastrointestinal:  Negative for abdominal pain, diarrhea and vomiting.  Musculoskeletal:  Positive for arthralgias, back pain, gait problem and myalgias.  Skin:  Negative for rash.  Neurological:  Negative for syncope and facial asymmetry.  Psychiatric/Behavioral:  Negative for confusion. The patient is nervous/anxious.   See other pertinent positives and negatives in HPI.  Current Outpatient Medications on File Prior to Visit  Medication Sig Dispense Refill   ALPRAZolam (XANAX) 1 MG tablet TAKE ONE-HALF TABLET BY MOUTH AT NOON AND 1 EVERY NIGHT AT BEDTIME. 45 tablet 3   antiseptic oral rinse (BIOTENE) LIQD Use 3 mls 3-4 times per day for dry mouth. Do not swallow. 237 mL 0   Cholecalciferol (VITAMIN D3) 1.25 MG (50000 UT) capsule TAKE 1 CAPSULE  BY MOUTH EVERY 14 DAYS 7 capsule 1   Dextran 70-Hypromellose (ARTIFICIAL TEARS PF OP) Place 1-2 drops into both eyes See admin instructions. Instill 1-2 drops into both eyes at bedtime and an additional 2-3 times a day as needed for dryness     diclofenac (FLECTOR) 1.3 % PTCH Place 1 patch onto the skin 2 (two) times daily. 60  patch 1   diphenhydramine-acetaminophen (TYLENOL PM) 25-500 MG TABS tablet Take 1.5 tablets by mouth at bedtime.     gabapentin (NEURONTIN) 300 MG capsule Take 2 capsules at nighttime for 2 weeks. If additional control needed, can increase to 3 capsules at nighttime. 90 capsule 2   levETIRAcetam (KEPPRA) 500 MG tablet TAKE 1 TABLET(500 MG) BY MOUTH TWICE DAILY 180 tablet 0   Magnesium 400 MG TABS Take 400 mg by mouth at bedtime.     MAXALT-MLT 10 MG disintegrating tablet DISSOLVE ONE TABLET BY MOUTH AS NEEDED HEADACHE. 9 tablet 11   Melatonin 10 MG TABS Take 10 mg by mouth at bedtime as needed (for sleep).     omeprazole (PRILOSEC OTC) 20 MG tablet Take 20 mg by mouth daily before breakfast.     POTASSIUM PO Take 1 tablet by mouth daily with lunch.     pyridOXINE (VITAMIN B-6) 100 MG tablet Take 400 mg by mouth daily with lunch.     riboflavin (VITAMIN B-2) 100 MG TABS tablet Take 400 mg by mouth daily with lunch.     tretinoin (RETIN-A) 0.1 % cream APPLY TO AFFECTED AREA EVERY DAY AT BEDTIME 45 g 2   verapamil (VERELAN PM) 180 MG 24 hr capsule TAKE 1 CAPSULE(180 MG) BY MOUTH DAILY 90 capsule 3   oxybutynin (DITROPAN-XL) 10 MG 24 hr tablet TAKE 1 TABLET(10 MG) BY MOUTH AT BEDTIME 90 tablet 1   No current facility-administered medications on file prior to visit.   Past Medical History:  Diagnosis Date   Anxiety    Arthritis    Chronic insomnia 03/30/2015   Chronic low back pain 12/02/2016   Depression    GERD (gastroesophageal reflux disease)    Hiatal hernia    High cholesterol    Hypertelorism    Hypertension    Meningioma    Migraine    Allergies  Allergen Reactions   Dihydrotachysterol Other (See Comments)    Dihydrotachysterol is a form of vitamin D. Might be "DHT Intensol" (Name Brand)- Reaction unknown- tolerating 50,000 units of Vitamin D-2 (Drisdol) in 2022   Trazodone And Nefazodone Other (See Comments)    Worsens headaches   Valium [Diazepam] Other (See Comments)     headaches   Citalopram Other (See Comments)    Other reaction(s): Headache   Other Other (See Comments)    "States all antidepressants cause migraines." - tolerates Xanax    Social History   Socioeconomic History   Marital status: Widowed    Spouse name: Not on file   Number of children: 4   Years of education: 23   Highest education level: Not on file  Occupational History   Occupation: Retired  Tobacco Use   Smoking status: Never   Smokeless tobacco: Never  Vaping Use   Vaping Use: Never used  Substance and Sexual Activity   Alcohol use: No   Drug use: No   Sexual activity: Not Currently  Other Topics Concern   Not on file  Social History Narrative   Lives in [facility] - [facility]      Patient drinks  1 glass of tea daily.      Patient is right handed.   Social Determinants of Health   Financial Resource Strain: Low Risk  (11/19/2022)   Overall Financial Resource Strain (CARDIA)    Difficulty of Paying Living Expenses: Not hard at all  Food Insecurity: No Food Insecurity (11/19/2022)   Hunger Vital Sign    Worried About Running Out of Food in the Last Year: Never true    Ran Out of Food in the Last Year: Never true  Transportation Needs: No Transportation Needs (11/19/2022)   PRAPARE - Administrator, Civil Service (Medical): No    Lack of Transportation (Non-Medical): No  Physical Activity: Insufficiently Active (11/19/2022)   Exercise Vital Sign    Days of Exercise per Week: 2 days    Minutes of Exercise per Session: 60 min  Stress: No Stress Concern Present (11/19/2022)   [employer] of Occupational Health - Occupational Stress Questionnaire    Feeling of Stress : Not at all  Social Connections: Moderately Integrated (11/19/2022)   Social Connection and Isolation Panel [NHANES]    Frequency of Communication with Friends and Family: More than three times a week    Frequency of Social Gatherings with Friends and Family: More than three times a week     Attends Religious Services: More than 4 times per year    Active Member of [employer] or Organizations: Yes    Attends Banker Meetings: More than 4 times per year    Marital Status: Widowed   Vitals:   02/11/23 1057  BP: 118/64  Pulse: 100  Resp: 16  SpO2: 94%   Body mass index is 30.82 kg/m.  Physical Exam Vitals and nursing note reviewed.  Constitutional:      General: She is not in acute distress.    Appearance: She is well-developed and well-groomed.  HENT:     Head: Normocephalic and atraumatic.     Mouth/Throat:     Mouth: Mucous membranes are moist.  Eyes:     Conjunctiva/sclera: Conjunctivae normal.  Cardiovascular:     Rate and Rhythm: Normal rate and regular rhythm.     Heart sounds: Murmur (Soft SEM LUSB) heard.     Comments: DP pulses palpable. Pulmonary:     Effort: Pulmonary effort is normal. No respiratory distress.     Breath sounds: Normal breath sounds.  Abdominal:     Palpations: Abdomen is soft.     Tenderness: There is no abdominal tenderness.  Musculoskeletal:     Right lower leg: No edema.     Left lower leg: No edema.  Skin:    General: Skin is warm and dry.     Findings: No erythema or rash.     Comments: Dry skin with fine scaly areas.  Neurological:     General: No focal deficit present.     Mental Status: She is alert and oriented to person, place, and time.     Comments: Antalgic gait, assisted with a walker.  Psychiatric:        Mood and Affect: Affect normal. Mood is anxious.   ASSESSMENT AND PLAN:  Mr.Marvelyn was seen today for medical management of chronic issues.  Diagnoses and all orders for this visit:  Chronic insomnia Assessment & Plan: Reports that her sleep has improved. Continue Melatonin and Tylenol pm, cautions with the latter one. Continue Alprazolam 1 mg 1/2-1 tab at bedtime as needed and good sleep hygiene.   Lumbar  radiculopathy Assessment & Plan: Reporting that epidural injections have not  helped. She is on Gabapentin 300 mg 2 times daily. Follows with pain management and neurosurgeon. Fall precautions discussed.   Essential hypertension Assessment & Plan: BP adequately controlled. She is on verapamil 180 mg daily, no changes. Continue low salt diet. Continue monitoring BP regularly.   Urge incontinence of urine Assessment & Plan: Problem worsened after discontinuation of pharmacologic treatment. She would like to resume Oxybutynin ER 10 mg, side effects discussed.   Xerosis of skin Assessment & Plan: Prescription for Ammonium Lactate 12% sent as requested. Continue daily application as needed.  Orders: -     Ammonium Lactate; Apply 1 Application topically daily as needed for dry skin (on heels).  Dispense: 222 g; Refill: 0  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HCC) Assessment & Plan: Brain MRI done on 04/12/21:1 cm right parietal convexity meningioma with no associated mass effect. She follows with neurologist.   Atherosclerosis of aorta [facility]) Assessment & Plan: Lumbar CT 09/19/2022 and abdominal CT in 03/2021:Aortic Atherosclerosis. She is not on statin, did not tolerate Pravastatin. She is willing to try again, recommended trying 20 mg 3 times per week. LDL 121 in 03/2021.  Orders: -     Pravastatin Sodium; Take 1 tablet (20 mg total) by mouth 3 (three) times a week.  Dispense: 13 tablet; Refill: 2  Generalized anxiety disorder Assessment & Plan: Continue Alprazolam 1 mg bid prn, max 45 tabs per month.She has been on this medication for many years, has not tolerated SSRI's and SNRI's. PMP reviewed, she has 3 refills available. Side effects reviewed. F/U in 5 months, before if needed.    I spent a total of 40 minutes in both face to face and non face to face activities for this visit on the date of this encounter. During this time history was obtained and documented, examination was performed, prior labs/imaging reviewed, and assessment/plan  discussed.  Return in about 5 months (around 07/14/2023) for chronic problems.  Shivon Hackel G. [place], MD  [facility]. [place] office.

## 2023-02-11 ENCOUNTER — Encounter: Payer: Self-pay | Admitting: Family Medicine

## 2023-02-11 ENCOUNTER — Ambulatory Visit (INDEPENDENT_AMBULATORY_CARE_PROVIDER_SITE_OTHER): Payer: Medicare Other | Admitting: Family Medicine

## 2023-02-11 VITALS — BP 118/64 | HR 100 | Resp 16 | Ht 62.0 in | Wt 168.5 lb

## 2023-02-11 DIAGNOSIS — G894 Chronic pain syndrome: Secondary | ICD-10-CM | POA: Diagnosis not present

## 2023-02-11 DIAGNOSIS — M5416 Radiculopathy, lumbar region: Secondary | ICD-10-CM | POA: Diagnosis not present

## 2023-02-11 DIAGNOSIS — R2689 Other abnormalities of gait and mobility: Secondary | ICD-10-CM | POA: Diagnosis not present

## 2023-02-11 DIAGNOSIS — I700 Atherosclerosis of aorta: Secondary | ICD-10-CM

## 2023-02-11 DIAGNOSIS — M47816 Spondylosis without myelopathy or radiculopathy, lumbar region: Secondary | ICD-10-CM | POA: Diagnosis not present

## 2023-02-11 DIAGNOSIS — L853 Xerosis cutis: Secondary | ICD-10-CM | POA: Insufficient documentation

## 2023-02-11 DIAGNOSIS — D329 Benign neoplasm of meninges, unspecified: Secondary | ICD-10-CM | POA: Diagnosis not present

## 2023-02-11 DIAGNOSIS — N3941 Urge incontinence: Secondary | ICD-10-CM

## 2023-02-11 DIAGNOSIS — M5459 Other low back pain: Secondary | ICD-10-CM | POA: Diagnosis not present

## 2023-02-11 DIAGNOSIS — M25552 Pain in left hip: Secondary | ICD-10-CM | POA: Diagnosis not present

## 2023-02-11 DIAGNOSIS — I10 Essential (primary) hypertension: Secondary | ICD-10-CM

## 2023-02-11 DIAGNOSIS — F411 Generalized anxiety disorder: Secondary | ICD-10-CM

## 2023-02-11 DIAGNOSIS — F5104 Psychophysiologic insomnia: Secondary | ICD-10-CM | POA: Diagnosis not present

## 2023-02-11 MED ORDER — PRAVASTATIN SODIUM 20 MG PO TABS: 20.0000 mg | ORAL_TABLET | ORAL | 2 refills | Status: DC

## 2023-02-11 MED ORDER — AMMONIUM LACTATE 12 % EX LOTN: 1.0000 | TOPICAL_LOTION | Freq: Every day | CUTANEOUS | 0 refills | Status: AC | PRN

## 2023-02-11 NOTE — Assessment & Plan Note (Signed)
Reports that her sleep has improved. Continue Melatonin and Tylenol pm, cautions with the latter one. Continue Alprazolam 1 mg 1/2-1 tab at bedtime as needed and good sleep hygiene.

## 2023-02-11 NOTE — Assessment & Plan Note (Signed)
BP adequately controlled. She is on verapamil 180 mg daily, no changes. Continue low salt diet. Continue monitoring BP regularly.

## 2023-02-11 NOTE — Patient Instructions (Addendum)
A few things to remember from today's visit:  Chronic insomnia  Atherosclerosis of aorta, Chronic - Plan: pravastatin (PRAVACHOL) 20 MG tablet  Benign neoplasm of meninges, unspecified, Chronic  Lumbar radiculopathy  Essential hypertension  Urge incontinence of urine  Xerosis of skin - Plan: ammonium lactate (LAC-HYDRIN) 12 % lotion  Today we resumed the Oxybutynin daily. Pravastatin 3 times per week and lotion you requested for dry skin also sent to your pharmacy. You have 3 refills for Alprazolam at your pharmacy, not due for refill.  We can plan on labs next visit.  If you need refills for medications you take chronically, please call your pharmacy. Do not use My Chart to request refills or for acute issues that need immediate attention. If you send a my chart message, it may take a few days to be addressed, specially if I am not in the office.  Please be sure medication list is accurate. If a new problem present, please set up appointment sooner than planned today.

## 2023-02-11 NOTE — Assessment & Plan Note (Addendum)
Problem worsened after discontinuation of pharmacologic treatment. She would like to resume Oxybutynin ER 10 mg, side effects discussed.

## 2023-02-11 NOTE — Assessment & Plan Note (Addendum)
Lumbar CT 09/19/2022 and abdominal CT in 03/2021:Aortic Atherosclerosis. She is not on statin, did not tolerate Pravastatin. She is willing to try again, recommended trying 20 mg 3 times per week. LDL 121 in 03/2021.

## 2023-02-11 NOTE — Assessment & Plan Note (Signed)
Reporting that epidural injections have not helped. She is on Gabapentin 300 mg 2 times daily. Follows with pain management and neurosurgeon. Fall precautions discussed.

## 2023-02-14 NOTE — Assessment & Plan Note (Signed)
Brain MRI done on 04/12/21:1 cm right parietal convexity meningioma with no associated mass effect. She follows with neurologist.

## 2023-02-14 NOTE — Assessment & Plan Note (Signed)
Prescription for Ammonium Lactate 12% sent as requested. Continue daily application as needed.

## 2023-02-14 NOTE — Assessment & Plan Note (Signed)
Continue Alprazolam 1 mg bid prn, max 45 tabs per month.She has been on this medication for many years, has not tolerated SSRI's and SNRI's. PMP reviewed, she has 3 refills available. Side effects reviewed. F/U in 5 months, before if needed.

## 2023-02-19 DIAGNOSIS — M5459 Other low back pain: Secondary | ICD-10-CM | POA: Diagnosis not present

## 2023-02-19 DIAGNOSIS — M5416 Radiculopathy, lumbar region: Secondary | ICD-10-CM | POA: Diagnosis not present

## 2023-02-19 DIAGNOSIS — M47816 Spondylosis without myelopathy or radiculopathy, lumbar region: Secondary | ICD-10-CM | POA: Diagnosis not present

## 2023-02-19 DIAGNOSIS — G894 Chronic pain syndrome: Secondary | ICD-10-CM | POA: Diagnosis not present

## 2023-02-19 DIAGNOSIS — M25552 Pain in left hip: Secondary | ICD-10-CM | POA: Diagnosis not present

## 2023-02-19 DIAGNOSIS — R2689 Other abnormalities of gait and mobility: Secondary | ICD-10-CM | POA: Diagnosis not present

## 2023-02-23 ENCOUNTER — Encounter
Payer: Medicare Other | Attending: Physical Medicine and Rehabilitation | Admitting: Physical Medicine and Rehabilitation

## 2023-02-23 VITALS — BP 118/78 | HR 81 | Ht 62.0 in | Wt 170.0 lb

## 2023-02-23 DIAGNOSIS — S32010A Wedge compression fracture of first lumbar vertebra, initial encounter for closed fracture: Secondary | ICD-10-CM

## 2023-02-23 DIAGNOSIS — M5416 Radiculopathy, lumbar region: Secondary | ICD-10-CM | POA: Diagnosis not present

## 2023-02-23 DIAGNOSIS — G894 Chronic pain syndrome: Secondary | ICD-10-CM | POA: Diagnosis not present

## 2023-02-23 DIAGNOSIS — Z5181 Encounter for therapeutic drug level monitoring: Secondary | ICD-10-CM | POA: Insufficient documentation

## 2023-02-23 DIAGNOSIS — Z79899 Other long term (current) drug therapy: Secondary | ICD-10-CM | POA: Insufficient documentation

## 2023-02-23 DIAGNOSIS — M47816 Spondylosis without myelopathy or radiculopathy, lumbar region: Secondary | ICD-10-CM

## 2023-02-23 DIAGNOSIS — F5104 Psychophysiologic insomnia: Secondary | ICD-10-CM | POA: Diagnosis not present

## 2023-02-23 MED ORDER — HYDROCODONE-ACETAMINOPHEN 5-325 MG PO TABS: 0.5000 | ORAL_TABLET | Freq: Two times a day (BID) | ORAL | 0 refills | Status: DC | PRN

## 2023-02-23 NOTE — Progress Notes (Signed)
Subjective:    Patient ID: Tracey Armstrong, female    DOB: April 04, 1935, 88 y.o.   MRN: [id number]  HPI  Pain Inventory Average Pain 10 Pain Right Now 8 My pain is constant, sharp, burning, stabbing, and aching  In the last 24 hours, has pain interfered with the following? General activity 7 Relation with others 1 Enjoyment of life 5 What TIME of day is your pain at its worst? morning  and daytime Sleep (in general) Fair  Pain is worse with: walking, sitting, and standing Pain improves with: rest Relief from Meds:  n/a  Family History  Problem Relation Age of Onset   Heart Problems Mother    Migraines Father    Stroke Father    Epilepsy Brother    Cancer Brother 78       unknown type - metastatic   Social History   Socioeconomic History   Marital status: Widowed    Spouse name: Not on file   Number of children: 4   Years of education: 23   Highest education level: Not on file  Occupational History   Occupation: Retired  Tobacco Use   Smoking status: Never   Smokeless tobacco: Never  Vaping Use   Vaping Use: Never used  Substance and Sexual Activity   Alcohol use: No   Drug use: No   Sexual activity: Not Currently  Other Topics Concern   Not on file  Social History Narrative   Lives in [facility] - [facility]      Patient drinks 1 glass of tea daily.      Patient is right handed.   Social Determinants of Health   Financial Resource Strain: Low Risk  (11/19/2022)   Overall Financial Resource Strain (CARDIA)    Difficulty of Paying Living Expenses: Not hard at all  Food Insecurity: No Food Insecurity (11/19/2022)   Hunger Vital Sign    Worried About Running Out of Food in the Last Year: Never true    Ran Out of Food in the Last Year: Never true  Transportation Needs: No Transportation Needs (11/19/2022)   PRAPARE - Administrator, Civil Service (Medical): No    Lack of Transportation (Non-Medical): No  Physical Activity: Insufficiently Active  (11/19/2022)   Exercise Vital Sign    Days of Exercise per Week: 2 days    Minutes of Exercise per Session: 60 min  Stress: No Stress Concern Present (11/19/2022)   [employer] of Occupational Health - Occupational Stress Questionnaire    Feeling of Stress : Not at all  Social Connections: Moderately Integrated (11/19/2022)   Social Connection and Isolation Panel [NHANES]    Frequency of Communication with Friends and Family: More than three times a week    Frequency of Social Gatherings with Friends and Family: More than three times a week    Attends Religious Services: More than 4 times per year    Active Member of [employer] or Organizations: Yes    Attends Banker Meetings: More than 4 times per year    Marital Status: Widowed   Past Surgical History:  Procedure Laterality Date   FEMUR IM NAIL Left 06/02/2020   Procedure: INTRAMEDULLARY (IM) RETROGRADE FEMORAL NAILING - LEFT;  Surgeon: Samson Frederic, MD;  Location: [facility] OR;  Service: Orthopedics;  Laterality: Left;   STOMACH SURGERY     Past Surgical History:  Procedure Laterality Date   FEMUR IM NAIL Left 06/02/2020   Procedure: INTRAMEDULLARY (IM) RETROGRADE FEMORAL  NAILING - LEFT;  Surgeon: Samson Frederic, MD;  Location: [facility] OR;  Service: Orthopedics;  Laterality: Left;   STOMACH SURGERY     Past Medical History:  Diagnosis Date   Anxiety    Arthritis    Chronic insomnia 03/30/2015   Chronic low back pain 12/02/2016   Depression    GERD (gastroesophageal reflux disease)    Hiatal hernia    High cholesterol    Hypertelorism    Hypertension    Meningioma (HCC)    Migraine    BP 118/78   Pulse 81   Ht 5\' 2"  (1.575 m)   Wt 170 lb (77.1 kg)   SpO2 96%   BMI 31.09 kg/m   Opioid Risk Score:   Fall Risk Score:  `1  Depression screen [facility] 2/9     02/23/2023   11:07 AM 11/19/2022    2:37 PM 11/05/2022   10:07 AM 10/01/2022    2:54 PM 07/30/2022   11:19 AM 05/14/2022   11:20 AM 02/26/2022   12:22 PM   Depression screen PHQ 2/9  Decreased Interest 1 0 3 0 0 0 0  Down, Depressed, Hopeless 1 0 0 0 0 0 0  PHQ - 2 Score 2 0 3 0 0 0 0  Altered sleeping  0 3  0 2   Tired, decreased energy  0 3  0 2   Change in appetite  0 1  0 0   Feeling bad or failure about yourself   0 0  0 0   Trouble concentrating  0 0  0 2   Moving slowly or fidgety/restless  0 0  0 0   Suicidal thoughts  0 0  0 0   PHQ-9 Score  0 10  0 6   Difficult doing work/chores     Not difficult at all Somewhat difficult      Review of Systems  Musculoskeletal:  Positive for back pain.       LT hip B/L knee Lt leg pain  All other systems reviewed and are negative.     Objective:   Physical Exam        Assessment & Plan:

## 2023-02-23 NOTE — Patient Instructions (Addendum)
  Chronic pain disorder Closed wedge compression fracture of L1 vertebra, initial encounter (HCC) Lumbar facet arthropathy Today, I am prescribing you Norco 5 mg number 45 tablets for 1 month.  Take a half a tab up to twice daily for severe pain, you can occasionally increase this up to 1 tab if you need extra relief.  You also signed a chronic pain contract, and had a urine drug screen performed today.  Please call the clinic in 1 week or message me through MyChart to let me know how you are doing on this medication.  Specifically, if it is causing any drowsiness, dizziness, constipation, or falls.  Continue physical therapy at [facility], and maintain an active social and physical lifestyle.  You will follow-up with Riley Lam, our nurse practitioner, in 1 month. If your pain is well-controlled at that time and you are not having symptoms from the new medication, we can stretch follow-ups to every 3 months.  Lumbar radiculopathy Reduce gabapentin back to 1 capsule twice daily. Contact Dr. Alvester Morin for repeat epidural steroid injections as needed

## 2023-02-23 NOTE — Progress Notes (Unsigned)
Tracey Armstrong is a 88 y.o. year old female  who  has a past medical history of Anxiety, Arthritis, Chronic insomnia (03/30/2015), Chronic low back pain (12/02/2016), Depression, GERD (gastroesophageal reflux disease), Hiatal hernia, High cholesterol, Hypertelorism, Hypertension, Meningioma (HCC), and Migraine.   They are presenting to PM&R clinic for follow up related to  chronic low back pain with bilateral leg pain and weakness.  .  Plan from last visit:  Lumbar facet arthropathy -     Ambulatory referral to Physical Therapy Discussed MBB with Dr. Alvester Morin; available through his office if patient wishes to pursue, she has historically done well with ESI so he has continued these     Lumbar radiculopathy -     Ambulatory referral to Physical Therapy Remove your afternoon dose of gabapentin and only take it just before bed.  Start with 2 capsules at bedtime, and after 2 weeks call me in clinic and let me know if you are having any effect from this medication.  At that time, if needed, we can increase it to 3 capsules at nighttime.I will want to hear from you in 2 weeks regardless of effective medication.  Please call the office or message me through MyChart.   Please return to Dr. Alvester Morin for epidural steroid injections (last 2/21).  I will also message him through the chart as well.  If the effectiveness of these are decreasing, it may be appropriate to consider a neurosurgical referral.   Chronic pain disorder -     Ambulatory referral to Physical Therapy   I have sent a prescription back to PT at [facility], to help with your pain control mobility.  Please ensure that any home exercise program that they prescribe, you are able to perform in your home.  I have also mentioned this in the referral.   I will want to see you back in 1 to 2 months to see how things are going.  If pain continues to be severe, and there are no improvements with the above regimen, we may consider a pain contract and  low-dose opiate prescription at that time; however, as stated during this visit, that is the last option for your pain control and you will need to be compliant with your current medications before we will take that route.   Unilateral primary osteoarthritis, left knee Has done steroid injections in the past, most recently 2/26 Can continue these via ortho; did discuss Simviisc or Orthovisc as options as well   Other orders -     Gabapentin; Take 2 capsules at nighttime for 2 weeks. If additional control needed, can increase to 3 capsules at nighttime.  Dispense: 90 capsule; Refill: 2   Interval Hx:  - Therapies:  PT at [facility] - started back up. She states it is very helpful., and she likes her therapist a lot.    - Follow ups: Saw Dr. [place]il[place] recently, she added a cholesterol medication and a lotion for her skin.   - - Did not follow up with Dr. Alvester Morin; last injection 3 months ago, goes Q3M, about due.    - Falls: "I got off the bed somehow and next thing I knew I was on the floor." This happened early in the morning when she was just waking up, before taking ay of her medications.   - DME: Continues to use rollator   - Medications: Change gabapentin BID to QHS   - She continues BID in the morning and at lunch; she  states she does not have pain at nighttime so she does not take gabapentin.   - - She does take tylenol PM, benadryl, xanax and melatonin for sleep. She has been on this for "forever" and says her doctor has prescribed all of these medications.    - Other concerns: States she goes to the dining room, PT 2 days per week, and can go play poker or listen to music at the nursing home. She also goes to the salon in her nursing facility.    PE: Constitution: Appropriate appearance for age. No apparent distress  +Obese Resp: No respiratory distress. No accessory muscle usage. on RA and CTAB Cardio: Well perfused appearance. 1+ peripheral edema. Abdomen: Nondistended.  Nontender.   Psych: Appropriate mood and affect. Neuro: AAOx4. Mild, unchanged cognitive deficits   Neurologic Exam:   DTRs: Reflexes were 2+ in bilateral achilles, patella, biceps, BR and triceps. Babinsky: flexor responses b/l.   Hoffmans: negative b/l Sensory exam: revealed normal sensation in all dermatomal regions in bilateral lower extremities Motor exam: strength 5/5 throughout bilateral lower extremities   Gait:  forward bending, reduced speed, with use of RW    Tracey Armstrong is a 88 y.o. year old female  who  has a past medical history of Anxiety, Arthritis, Chronic insomnia (03/30/2015), Chronic low back pain (12/02/2016), Depression, GERD (gastroesophageal reflux disease), Hiatal hernia, High cholesterol, Hypertelorism, Hypertension, Meningioma (HCC), and Migraine.   They are presenting to PM&R clinic as a follow up for treatment of chronic pain.   Chronic pain disorder Closed wedge compression fracture of L1 vertebra, initial encounter (HCC) Lumbar facet arthropathy Today, I am prescribing you Norco 5 mg number 45 tablets for 1 month.  Take a half a tab up to twice daily for severe pain, you can occasionally increase this up to 1 tab if you need extra relief.  You also signed a chronic pain contract, and had a urine drug screen performed today.  Please call the clinic in 1 week or message me through MyChart to let me know how you are doing on this medication.  Specifically, if it is causing any drowsiness, dizziness, constipation, or falls.  Continue physical therapy at [facility], and maintain an active social and physical lifestyle.  You will follow-up with Riley Lam, our nurse practitioner, in 1 month. If your pain is well-controlled at that time and you are not having symptoms from the new medication, we can stretch follow-ups to every 3 months.  Lumbar radiculopathy Reduce gabapentin back to 1 capsule twice daily. Contact Dr. Alvester Morin for repeat epidural steroid injections  as needed

## 2023-02-24 DIAGNOSIS — M5416 Radiculopathy, lumbar region: Secondary | ICD-10-CM | POA: Diagnosis not present

## 2023-02-24 DIAGNOSIS — G894 Chronic pain syndrome: Secondary | ICD-10-CM | POA: Diagnosis not present

## 2023-02-24 DIAGNOSIS — M5459 Other low back pain: Secondary | ICD-10-CM | POA: Diagnosis not present

## 2023-02-24 DIAGNOSIS — M25552 Pain in left hip: Secondary | ICD-10-CM | POA: Diagnosis not present

## 2023-02-24 DIAGNOSIS — R2689 Other abnormalities of gait and mobility: Secondary | ICD-10-CM | POA: Diagnosis not present

## 2023-02-24 DIAGNOSIS — M47816 Spondylosis without myelopathy or radiculopathy, lumbar region: Secondary | ICD-10-CM | POA: Diagnosis not present

## 2023-02-25 LAB — TOXASSURE SELECT,+ANTIDEPR,UR

## 2023-02-26 DIAGNOSIS — M25552 Pain in left hip: Secondary | ICD-10-CM | POA: Diagnosis not present

## 2023-02-26 DIAGNOSIS — M5416 Radiculopathy, lumbar region: Secondary | ICD-10-CM | POA: Diagnosis not present

## 2023-02-26 DIAGNOSIS — M47816 Spondylosis without myelopathy or radiculopathy, lumbar region: Secondary | ICD-10-CM | POA: Diagnosis not present

## 2023-02-26 DIAGNOSIS — G894 Chronic pain syndrome: Secondary | ICD-10-CM | POA: Diagnosis not present

## 2023-02-26 DIAGNOSIS — M5459 Other low back pain: Secondary | ICD-10-CM | POA: Diagnosis not present

## 2023-02-26 DIAGNOSIS — R2689 Other abnormalities of gait and mobility: Secondary | ICD-10-CM | POA: Diagnosis not present

## 2023-03-03 DIAGNOSIS — M25552 Pain in left hip: Secondary | ICD-10-CM | POA: Diagnosis not present

## 2023-03-03 DIAGNOSIS — M47816 Spondylosis without myelopathy or radiculopathy, lumbar region: Secondary | ICD-10-CM | POA: Diagnosis not present

## 2023-03-03 DIAGNOSIS — G894 Chronic pain syndrome: Secondary | ICD-10-CM | POA: Diagnosis not present

## 2023-03-03 DIAGNOSIS — M5416 Radiculopathy, lumbar region: Secondary | ICD-10-CM | POA: Diagnosis not present

## 2023-03-03 DIAGNOSIS — M5459 Other low back pain: Secondary | ICD-10-CM | POA: Diagnosis not present

## 2023-03-03 DIAGNOSIS — R2689 Other abnormalities of gait and mobility: Secondary | ICD-10-CM | POA: Diagnosis not present

## 2023-03-04 ENCOUNTER — Telehealth: Payer: Self-pay | Admitting: Physical Medicine and Rehabilitation

## 2023-03-04 DIAGNOSIS — M5416 Radiculopathy, lumbar region: Secondary | ICD-10-CM

## 2023-03-04 NOTE — Telephone Encounter (Signed)
Spoke with patient and she is saying she needs another injection. Per Dr. Alvester Morin repeat Bilateral L4 transforaminal epidural steroid injection

## 2023-03-04 NOTE — Telephone Encounter (Signed)
Patient asking for appointment for injections in her back

## 2023-03-04 NOTE — Telephone Encounter (Signed)
Advised patient that the injection has to be processed through insurance before an appointment can be scheduled

## 2023-03-04 NOTE — Telephone Encounter (Signed)
Patient called needing to schedule an appointment with Dr. Alvester Morin. The number to contact patient is [phone]

## 2023-03-04 NOTE — Telephone Encounter (Signed)
See previous encounter

## 2023-03-04 NOTE — Addendum Note (Signed)
Addended by: Sharlet Salina on: 03/04/2023 04:31 PM   Modules accepted: Orders

## 2023-03-04 NOTE — Telephone Encounter (Signed)
Pt called requesting a appt for back/hip area injection. Pt phone number is [phone]

## 2023-03-05 DIAGNOSIS — M25552 Pain in left hip: Secondary | ICD-10-CM | POA: Diagnosis not present

## 2023-03-05 DIAGNOSIS — M5459 Other low back pain: Secondary | ICD-10-CM | POA: Diagnosis not present

## 2023-03-05 DIAGNOSIS — M47816 Spondylosis without myelopathy or radiculopathy, lumbar region: Secondary | ICD-10-CM | POA: Diagnosis not present

## 2023-03-05 DIAGNOSIS — R2689 Other abnormalities of gait and mobility: Secondary | ICD-10-CM | POA: Diagnosis not present

## 2023-03-05 DIAGNOSIS — G894 Chronic pain syndrome: Secondary | ICD-10-CM | POA: Diagnosis not present

## 2023-03-05 DIAGNOSIS — M5416 Radiculopathy, lumbar region: Secondary | ICD-10-CM | POA: Diagnosis not present

## 2023-03-06 ENCOUNTER — Telehealth: Payer: Self-pay | Admitting: Physical Medicine and Rehabilitation

## 2023-03-06 NOTE — Telephone Encounter (Signed)
Patient requesting an injection in her back

## 2023-03-09 DIAGNOSIS — M5459 Other low back pain: Secondary | ICD-10-CM | POA: Diagnosis not present

## 2023-03-09 DIAGNOSIS — M5416 Radiculopathy, lumbar region: Secondary | ICD-10-CM | POA: Diagnosis not present

## 2023-03-09 DIAGNOSIS — M25552 Pain in left hip: Secondary | ICD-10-CM | POA: Diagnosis not present

## 2023-03-09 DIAGNOSIS — R2689 Other abnormalities of gait and mobility: Secondary | ICD-10-CM | POA: Diagnosis not present

## 2023-03-09 DIAGNOSIS — G894 Chronic pain syndrome: Secondary | ICD-10-CM | POA: Diagnosis not present

## 2023-03-09 DIAGNOSIS — M47816 Spondylosis without myelopathy or radiculopathy, lumbar region: Secondary | ICD-10-CM | POA: Diagnosis not present

## 2023-03-10 ENCOUNTER — Telehealth: Payer: Self-pay | Admitting: Physical Medicine and Rehabilitation

## 2023-03-10 NOTE — Telephone Encounter (Signed)
I called and advised that we are waiting on auth from her insurance before we can get her scheduled.

## 2023-03-10 NOTE — Telephone Encounter (Signed)
Pt called requesting a call to set an appt for back injection. Pt asking asap call back. Pt phone number is [phone].

## 2023-03-10 NOTE — Telephone Encounter (Signed)
Duplicate message. 

## 2023-03-11 ENCOUNTER — Other Ambulatory Visit: Payer: Self-pay | Admitting: Neurology

## 2023-03-11 ENCOUNTER — Telehealth: Payer: Self-pay | Admitting: *Deleted

## 2023-03-11 DIAGNOSIS — M47816 Spondylosis without myelopathy or radiculopathy, lumbar region: Secondary | ICD-10-CM | POA: Diagnosis not present

## 2023-03-11 DIAGNOSIS — M5416 Radiculopathy, lumbar region: Secondary | ICD-10-CM | POA: Diagnosis not present

## 2023-03-11 DIAGNOSIS — G894 Chronic pain syndrome: Secondary | ICD-10-CM | POA: Diagnosis not present

## 2023-03-11 DIAGNOSIS — R2689 Other abnormalities of gait and mobility: Secondary | ICD-10-CM | POA: Diagnosis not present

## 2023-03-11 DIAGNOSIS — M25552 Pain in left hip: Secondary | ICD-10-CM | POA: Diagnosis not present

## 2023-03-11 DIAGNOSIS — M5459 Other low back pain: Secondary | ICD-10-CM | POA: Diagnosis not present

## 2023-03-11 NOTE — Telephone Encounter (Signed)
Urine drug screen for this encounter is inconsistent for prescribed medication. She is positive of oxycodone and its metabolites and she has not been prescribed this in the past 2 years per the PMP. There is no notation in the chart note of her telling us she had taken oxycodone.

## 2023-03-12 NOTE — Telephone Encounter (Signed)
Called the patient to discuss the results of her urine screen.  She knows and agrees that she has not been prescribed oxycodone in at least 2 years, and did not think she was taking anything prior to her appointment.  She does not manage her own medications, she has a caregiver named Camie Patience who has possession of all of her pill bottles and puts them into organizers for her.    She was not able to get me Angela's contact information, but said that she will be with her tomorrow morning.  If either you or Riley Lam has a few minutes in the morning to call her and talk to Marylene Land about what might of happened, I would really appreciate it!  I told her that, if we cannot figure this out, we likely will not be able to prescribe her controlled substances in the future.  She notes that her insurance did not cover the Norco, and she only got 14 tabs instead of the 45 prescribed Last time.  She does still have medication at home, and I am not going to refill anything until we figure this out.  I am very concerned about the caregiver potentially giving her medications that she was not prescribed.

## 2023-03-13 ENCOUNTER — Telehealth: Payer: Self-pay | Admitting: *Deleted

## 2023-03-13 NOTE — Telephone Encounter (Signed)
Marylene Land is calling for Ms. Zulauf she says she spoke with you on yesterday and had to disconnect. She is returning call please @ [phone]

## 2023-03-18 DIAGNOSIS — M5416 Radiculopathy, lumbar region: Secondary | ICD-10-CM | POA: Diagnosis not present

## 2023-03-18 DIAGNOSIS — R2689 Other abnormalities of gait and mobility: Secondary | ICD-10-CM | POA: Diagnosis not present

## 2023-03-18 DIAGNOSIS — M47816 Spondylosis without myelopathy or radiculopathy, lumbar region: Secondary | ICD-10-CM | POA: Diagnosis not present

## 2023-03-18 DIAGNOSIS — M5459 Other low back pain: Secondary | ICD-10-CM | POA: Diagnosis not present

## 2023-03-18 DIAGNOSIS — G894 Chronic pain syndrome: Secondary | ICD-10-CM | POA: Diagnosis not present

## 2023-03-18 DIAGNOSIS — M25552 Pain in left hip: Secondary | ICD-10-CM | POA: Diagnosis not present

## 2023-03-18 NOTE — Telephone Encounter (Signed)
I spoke with Tracey Armstrong and she is not aware of any oxycodone and does not know how she tested positive for it in her UDS.

## 2023-03-23 ENCOUNTER — Ambulatory Visit (INDEPENDENT_AMBULATORY_CARE_PROVIDER_SITE_OTHER): Payer: Medicare Other | Admitting: Physician Assistant

## 2023-03-23 ENCOUNTER — Ambulatory Visit: Payer: Medicare Other | Admitting: Registered Nurse

## 2023-03-23 ENCOUNTER — Ambulatory Visit: Payer: Medicare Other | Admitting: Physician Assistant

## 2023-03-23 ENCOUNTER — Encounter: Payer: Self-pay | Admitting: Physician Assistant

## 2023-03-23 DIAGNOSIS — M1712 Unilateral primary osteoarthritis, left knee: Secondary | ICD-10-CM | POA: Diagnosis not present

## 2023-03-23 DIAGNOSIS — M1711 Unilateral primary osteoarthritis, right knee: Secondary | ICD-10-CM | POA: Diagnosis not present

## 2023-03-23 MED ORDER — METHYLPREDNISOLONE ACETATE 40 MG/ML IJ SUSP
40.0000 mg | INTRAMUSCULAR | Status: AC | PRN
  Administered 2023-03-23: 40 mg via INTRA_ARTICULAR

## 2023-03-23 MED ORDER — METHYLPREDNISOLONE ACETATE 40 MG/ML IJ SUSP
40.0000 mg | INTRAMUSCULAR | Status: AC | PRN
Start: 2023-03-23 — End: 2023-03-23
  Administered 2023-03-23: 40 mg via INTRA_ARTICULAR

## 2023-03-23 MED ORDER — LIDOCAINE HCL 1 % IJ SOLN
3.0000 mL | INTRAMUSCULAR | Status: AC | PRN
  Administered 2023-03-23: 3 mL

## 2023-03-23 NOTE — Progress Notes (Signed)
Office Visit Note   Patient: Tracey Armstrong           Date of Birth: 1935/02/04           MRN: [id number] Visit Date: 03/23/2023              Requested by: Swaziland, [name], MD [address] [place],  [place] [address] PCP: [place], [name], MD  Chief Complaint  Patient presents with  . Right Knee - Pain  . Left Knee - Pain      HPI: Tracey Armstrong is a pleasant 88 year old woman who is a patient of Kriste Basque.  She periodically comes in for injections into her bilateral knees.  Last injection was in February.  Denies any injury.  She does get good relief with the injections  Assessment & Plan: Visit Diagnoses: Osteoarthritis bilateral knees  Plan: Went forward with injections without difficulty today.  Will follow-up with Gill as needed  Follow-Up Instructions: Return if symptoms worsen or fail to improve.   Ortho Exam  Patient is alert, oriented, no adenopathy, well-dressed, normal affect, normal respiratory effort. Bilateral knees no effusion no erythema compartments are soft and nontender she is neurovascularly intact.  She ambulates with a rollator  Imaging: No results found. No images are attached to the encounter.  Labs: Lab Results  Component Value Date   HGBA1C 5.2 04/12/2021   HGBA1C 5.6 04/09/2021   HGBA1C 5.5 12/12/2020   ESRSEDRATE 5 02/26/2017   CRP 0.6 04/09/2021   CRP 0.8 02/26/2017   REPTSTATUS 09/20/2022 FINAL 09/19/2022   CULT  09/19/2022    NO GROWTH Performed at [facility], [address], [place], [place] [address]    LABORGA ESCHERICHIA COLI (A) 04/09/2021   LABORGA KLEBSIELLA PNEUMONIAE (A) 04/09/2021     Lab Results  Component Value Date   ALBUMIN 4.0 07/30/2022   ALBUMIN 2.9 (L) 05/02/2021   ALBUMIN 2.9 (L) 04/12/2021    Lab Results  Component Value Date   MG 1.9 04/14/2021   MG 1.9 04/13/2021   MG 1.9 04/12/2021   Lab Results  Component Value Date   VD25OH 74.20 07/30/2022   VD25OH 27.05 (L) 10/28/2021   VD25OH  52.03 12/12/2020    No results found for: "PREALBUMIN"    Latest Ref Rng & Units 10/28/2021   10:30 AM 05/02/2021    4:51 PM 05/02/2021    4:34 PM  CBC EXTENDED  WBC 4.0 - 10.5 K/uL 5.9   4.8   RBC 3.87 - 5.11 Mil/uL 4.14   2.93   Hemoglobin 12.0 - 15.0 g/dL 82.9  9.5  9.6   HCT 56.2 - 46.0 % 39.9  28.0  29.7   Platelets 150.0 - 400.0 K/uL 248.0   187   NEUT# 1.7 - 7.7 K/uL   2.3   Lymph# 0.7 - 4.0 K/uL   2.0      There is no height or weight on file to calculate BMI.  Orders:  No orders of the defined types were placed in this encounter.  No orders of the defined types were placed in this encounter.    Procedures: Large Joint Inj: bilateral knee on 03/23/2023 10:29 AM Indications: pain and diagnostic evaluation Details: 25 G 1.5 in needle, anterolateral approach  Arthrogram: No  Medications (Right): 3 mL lidocaine 1 %; 40 mg methylPREDNISolone acetate 40 MG/ML Medications (Left): 3 mL lidocaine 1 %; 40 mg methylPREDNISolone acetate 40 MG/ML Outcome: tolerated well, no immediate complications Procedure, treatment  alternatives, risks and benefits explained, specific risks discussed. Consent was given by the patient.    Clinical Data: No additional findings.  ROS:  All other systems negative, except as noted in the HPI. Review of Systems  Objective: Vital Signs: There were no vitals taken for this visit.  Specialty Comments:  CT LUMBAR SPINE WITHOUT CONTRAST   TECHNIQUE:  Multidetector CT imaging of the lumbar spine was performed without  intravenous contrast administration. Multiplanar CT image  reconstructions were also generated.   COMPARISON:  Lumbar spine radiographs 04/04/2020 and MRI 09/09/2018   FINDINGS:  Segmentation: 5 lumbar type vertebrae.   Alignment: Mild thoracolumbar levoscoliosis. Grade 1 anterolisthesis  of L4 on L5, unchanged from the prior MRI.   Vertebrae: L1 superior endplate compression fracture with 30%  vertebral body height loss  and 4 mm retropulsion of the superior  endplate. A fracture line remains visible although there is  surrounding sclerosis. Hemangioma in the L4 vertebral body.   Paraspinal and other soft tissues: Aortic atherosclerosis without  aneurysm.   Disc levels: Disc bulging and posterior element hypertrophy result  in chronic mild-to-moderate spinal stenosis at L3-4 and moderate to  severe spinal stenosis at L4-5. No significant spinal stenosis  related to mild L1 retropulsion.   IMPRESSION:  1. Possibly subacute L1 compression fracture with 30% height loss  and 4 mm retropulsion. No associated spinal stenosis.  2. Chronic mild-to-moderate spinal stenosis at L3-4 and moderate to  severe spinal stenosis at L4-5.  3. Aortic Atherosclerosis (ICD10-I70.0).    Electronically Signed    By: Sebastian Ache M.D.    On: 04/04/2020 17:39  PMFS History: Patient Active Problem List   Diagnosis Date Noted  . Urge incontinence of urine 02/11/2023  . Xerosis of skin 02/11/2023  . Lumbar facet arthropathy 11/05/2022  . Seizures (HCC) 11/05/2022  . Personal history of fall 11/05/2022  . Unilateral primary osteoarthritis, left knee 04/14/2022  . Myalgia due to statin 02/26/2022  . Atherosclerosis of aorta (HCC) 07/25/2021  . Urinary tract infection due to ESBL Klebsiella 04/13/2021  . Acute metabolic encephalopathy 04/09/2021  . Hypertensive urgency 04/09/2021  . Lactic acidosis 04/09/2021  . Lab test positive for detection of COVID-19 virus 04/09/2021  . Abnormal urinalysis 04/09/2021  . Lumbar radiculopathy 02/06/2021  . Pes anserinus bursitis of left knee 09/03/2020  . Trochanteric bursitis of left hip 09/03/2020  . Closed intertrochanteric fracture of left femur (HCC) 07/18/2020  . 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 Synovial cyst of lumbar facet joint 04/05/2020  . Spinal stenosis of lumbar region with neurogenic claudication 04/05/2020  . Dehydration 04/05/2020  .  Closed wedge compression fracture of L1 vertebra (HCC) 04/04/2020  . Ambulatory dysfunction 04/04/2020  . Intractable low back pain 04/04/2020  . L1 vertebral fracture (HCC) 04/04/2020  . Urine, incontinence, stress female 02/07/2019  . Knee osteoarthritis 02/01/2018  . Iron deficiency anemia 10/28/2017  . Rhinitis, allergic 07/27/2017  . GERD without esophagitis 06/16/2017  . Chronic pain disorder 06/16/2017  . Vitamin D deficiency, unspecified 05/25/2017  . Hyperlipidemia 05/25/2017  . 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 Pain 02/26/2017  . Chronic low back pain 12/02/2016  . Abdominal pain 03/26/2016  . 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 Peptic esophagitis 03/26/2016  . Neuralgia neuritis, sciatic nerve 03/26/2016  . 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  . Chronic insomnia 03/30/2015  . Chronic migraine 01/10/2015  . Depression 04/20/2014  . Generalized anxiety disorder 04/20/2014  . Chronic back  pain 04/20/2014  . Chronic daily headache 04/20/2014  . Essential hypertension 04/20/2014  . Migraine headache 04/19/2014   Past Medical History:  Diagnosis Date  . Anxiety   . Arthritis   . Chronic insomnia 03/30/2015  . Chronic low back pain 12/02/2016  . Depression   . GERD (gastroesophageal reflux disease)   . Hiatal hernia   . High cholesterol   . Hypertelorism   . Hypertension   . Meningioma (HCC)   . Migraine     Family History  Problem Relation Age of Onset  . Heart Problems Mother   . Migraines Father   . Stroke Father   . Epilepsy Brother   . Cancer Brother 40       unknown type - metastatic    Past Surgical History:  Procedure Laterality Date  . FEMUR IM NAIL Left 06/02/2020   Procedure: INTRAMEDULLARY (IM) RETROGRADE FEMORAL NAILING - LEFT;  Surgeon: Samson Frederic, MD;  Location: [facility] OR;  Service: Orthopedics;  Laterality: Left;  . STOMACH SURGERY     Social History   Occupational History  . Occupation: Retired  Tobacco Use  . Smoking status: Never   . Smokeless tobacco: Never  Vaping Use  . Vaping Use: Never used  Substance and Sexual Activity  . Alcohol use: No  . Drug use: No  . Sexual activity: Not Currently

## 2023-03-24 DIAGNOSIS — M5459 Other low back pain: Secondary | ICD-10-CM | POA: Diagnosis not present

## 2023-03-24 DIAGNOSIS — M5416 Radiculopathy, lumbar region: Secondary | ICD-10-CM | POA: Diagnosis not present

## 2023-03-24 DIAGNOSIS — R2689 Other abnormalities of gait and mobility: Secondary | ICD-10-CM | POA: Diagnosis not present

## 2023-03-24 DIAGNOSIS — M25552 Pain in left hip: Secondary | ICD-10-CM | POA: Diagnosis not present

## 2023-03-24 DIAGNOSIS — G894 Chronic pain syndrome: Secondary | ICD-10-CM | POA: Diagnosis not present

## 2023-03-24 DIAGNOSIS — M47816 Spondylosis without myelopathy or radiculopathy, lumbar region: Secondary | ICD-10-CM | POA: Diagnosis not present

## 2023-03-25 ENCOUNTER — Other Ambulatory Visit: Payer: Self-pay

## 2023-03-25 ENCOUNTER — Ambulatory Visit (INDEPENDENT_AMBULATORY_CARE_PROVIDER_SITE_OTHER): Payer: Medicare Other | Admitting: Physical Medicine and Rehabilitation

## 2023-03-25 VITALS — BP 161/85 | HR 101

## 2023-03-25 DIAGNOSIS — M47816 Spondylosis without myelopathy or radiculopathy, lumbar region: Secondary | ICD-10-CM

## 2023-03-25 DIAGNOSIS — M5416 Radiculopathy, lumbar region: Secondary | ICD-10-CM

## 2023-03-25 DIAGNOSIS — M48061 Spinal stenosis, lumbar region without neurogenic claudication: Secondary | ICD-10-CM

## 2023-03-25 DIAGNOSIS — G8929 Other chronic pain: Secondary | ICD-10-CM

## 2023-03-25 MED ORDER — METHYLPREDNISOLONE ACETATE 80 MG/ML IJ SUSP
80.0000 mg | Freq: Once | INTRAMUSCULAR | Status: AC
Start: 2023-03-25 — End: 2023-03-25
  Administered 2023-03-25: 80 mg

## 2023-03-25 NOTE — Progress Notes (Signed)
Functional Pain Scale - descriptive words and definitions  Distressing (6)    Pain is present/unable to complete most ADLs limited by pain/sleep is difficult and active distraction is only marginal. Moderate range order  Average Pain  varies   +Driver, -BT, -Dye Allergies.  Lower back pain on both sides  

## 2023-03-25 NOTE — Patient Instructions (Signed)

## 2023-03-27 ENCOUNTER — Encounter: Payer: Self-pay | Admitting: Registered Nurse

## 2023-03-27 ENCOUNTER — Encounter: Payer: Medicare Other | Attending: Physical Medicine and Rehabilitation | Admitting: Registered Nurse

## 2023-03-27 VITALS — BP 134/77 | HR 91 | Ht 62.0 in | Wt 168.0 lb

## 2023-03-27 DIAGNOSIS — M47816 Spondylosis without myelopathy or radiculopathy, lumbar region: Secondary | ICD-10-CM | POA: Diagnosis not present

## 2023-03-27 DIAGNOSIS — M5416 Radiculopathy, lumbar region: Secondary | ICD-10-CM | POA: Diagnosis not present

## 2023-03-27 DIAGNOSIS — Z79899 Other long term (current) drug therapy: Secondary | ICD-10-CM | POA: Insufficient documentation

## 2023-03-27 DIAGNOSIS — G894 Chronic pain syndrome: Secondary | ICD-10-CM | POA: Insufficient documentation

## 2023-03-27 DIAGNOSIS — Z5181 Encounter for therapeutic drug level monitoring: Secondary | ICD-10-CM | POA: Insufficient documentation

## 2023-03-27 NOTE — Procedures (Signed)
Lumbosacral Transforaminal Epidural Steroid Injection - Sub-Pedicular Approach with Fluoroscopic Guidance  Patient: Tracey Armstrong      Date of Birth: [birth year] MRN: [id number] PCP: [place], [name], MD      Visit Date: 03/25/2023   Universal Protocol:    Date/Time: 03/25/2023  Consent Given By: the patient  Position: PRONE  Additional Comments: Vital signs were monitored before and after the procedure. Patient was prepped and draped in the usual sterile fashion. The correct patient, procedure, and site was verified.   Injection Procedure Details:   Procedure diagnoses: Lumbar radiculopathy [M54.16]    Meds Administered:  Meds ordered this encounter  Medications   methylPREDNISolone acetate (DEPO-MEDROL) injection 80 mg    Laterality: Bilateral  Location/Site: L4  Needle:5.0 in., 22 ga.  Short bevel or Quincke spinal needle  Needle Placement: Transforaminal  Findings:    -Comments: Excellent flow of contrast along the nerve, nerve root and into the epidural space.  Procedure Details: After squaring off the end-plates to get a true AP view, the C-arm was positioned so that an oblique view of the foramen as noted above was visualized. The target area is just inferior to the "nose of the scotty dog" or sub pedicular. The soft tissues overlying this structure were infiltrated with 2-3 ml. of 1% Lidocaine without Epinephrine.  The spinal needle was inserted toward the target using a "trajectory" view along the fluoroscope beam.  Under AP and lateral visualization, the needle was advanced so it did not puncture dura and was located close the 6 O'Clock position of the pedical in AP tracterory. Biplanar projections were used to confirm position. Aspiration was confirmed to be negative for CSF and/or blood. A 1-2 ml. volume of Isovue-250 was injected and flow of contrast was noted at each level. Radiographs were obtained for documentation purposes.   After attaining the desired  flow of contrast documented above, a 0.5 to 1.0 ml test dose of 0.25% Marcaine was injected into each respective transforaminal space.  The patient was observed for 90 seconds post injection.  After no sensory deficits were reported, and normal lower extremity motor function was noted,   the above injectate was administered so that equal amounts of the injectate were placed at each foramen (level) into the transforaminal epidural space.   Additional Comments:  No complications occurred Dressing: 2 x 2 sterile gauze and Band-Aid    Post-procedure details: Patient was observed during the procedure. Post-procedure instructions were reviewed.  Patient left the clinic in stable condition.

## 2023-03-27 NOTE — Progress Notes (Signed)
Subjective:    Patient ID: Tracey Armstrong, female    DOB: 10/29/1934, 88 y.o.   MRN: [id number]  HPI: Tracey Armstrong is a 88 y.o. female who returns for follow up appointment for chronic pain and medication refill. She states her pain is located in her lower back radiating into her left hip. She rates her pain 5. Her current exercise regime is walking with walker.  Tracey Armstrong lives in [place], we discussed her UDS, she states her niece came for a visit and she told her niece she was in pain and her niece gave her a pill. She doesn't know what her niece gave her. This was discussed with Dr Shearon Stalls, we will performed a Oral Swab today, she is unable to urinate she states. If oral swab is consistent Dr Shearon Stalls states she will prescribe, a warning letter will be mailed to Tracey Armstrong regarding medicatin compliance , and she was instructed not to take any medication from anyone, family or friends, We discussed the Narcotic Policy, she verbalizes understanding.   Care giver ( Angie) in the room and she verbalizes understanding as well.    Pain Inventory Average Pain 6 Pain Right Now 5 My pain is intermittent and aching  In the last 24 hours, has pain interfered with the following? General activity 5 Relation with others 0 Enjoyment of life 5 What TIME of day is your pain at its worst? daytime Sleep (in general) Good  Pain is worse with: bending Pain improves with: rest, heat/ice, and medicationinjections Relief from Meds: 6  Family History  Problem Relation Age of Onset   Heart Problems Mother    Migraines Father    Stroke Father    Epilepsy Brother    Cancer Brother 36       unknown type - metastatic   Social History   Socioeconomic History   Marital status: Widowed    Spouse name: Not on file   Number of children: 4   Years of education: 15   Highest education level: Not on file  Occupational History   Occupation: Retired  Tobacco Use   Smoking status: Never   Smokeless  tobacco: Never  Vaping Use   Vaping Use: Never used  Substance and Sexual Activity   Alcohol use: No   Drug use: No   Sexual activity: Not Currently  Other Topics Concern   Not on file  Social History Narrative   Lives in [place] [facility]      Patient drinks 1 glass of tea daily.      Patient is right handed.   Social Determinants of Health   Financial Resource Strain: Low Risk  (11/19/2022)   Overall Financial Resource Strain (CARDIA)    Difficulty of Paying Living Expenses: Not hard at all  Food Insecurity: No Food Insecurity (11/19/2022)   Hunger Vital Sign    Worried About Running Out of Food in the Last Year: Never true    Ran Out of Food in the Last Year: Never true  Transportation Needs: No Transportation Needs (11/19/2022)   PRAPARE - Administrator, Civil Service (Medical): No    Lack of Transportation (Non-Medical): No  Physical Activity: Insufficiently Active (11/19/2022)   Exercise Vital Sign    Days of Exercise per Week: 2 days    Minutes of Exercise per Session: 60 min  Stress: No Stress Concern Present (11/19/2022)   [employer] of Occupational Health - Occupational Stress Questionnaire    Feeling  of Stress : Not at all  Social Connections: Moderately Integrated (11/19/2022)   Social Connection and Isolation Panel [NHANES]    Frequency of Communication with Friends and Family: More than three times a week    Frequency of Social Gatherings with Friends and Family: More than three times a week    Attends Religious Services: More than 4 times per year    Active Member of [employer] or Organizations: Yes    Attends Banker Meetings: More than 4 times per year    Marital Status: Widowed   Past Surgical History:  Procedure Laterality Date   FEMUR IM NAIL Left 06/02/2020   Procedure: INTRAMEDULLARY (IM) RETROGRADE FEMORAL NAILING - LEFT;  Surgeon: Samson Frederic, MD;  Location: [facility] OR;  Service: Orthopedics;  Laterality: Left;   STOMACH  SURGERY     Past Surgical History:  Procedure Laterality Date   FEMUR IM NAIL Left 06/02/2020   Procedure: INTRAMEDULLARY (IM) RETROGRADE FEMORAL NAILING - LEFT;  Surgeon: Samson Frederic, MD;  Location: [facility] OR;  Service: Orthopedics;  Laterality: Left;   STOMACH SURGERY     Past Medical History:  Diagnosis Date   Anxiety    Arthritis    Chronic insomnia 03/30/2015   Chronic low back pain 12/02/2016   Depression    GERD (gastroesophageal reflux disease)    Hiatal hernia    High cholesterol    Hypertelorism    Hypertension    Meningioma (HCC)    Migraine    BP 134/77   Pulse 91   Ht 5\' 2"  (1.575 m)   Wt 168 lb (76.2 kg)   SpO2 96%   BMI 30.73 kg/m   Opioid Risk Score:   Fall Risk Score:  `1  Depression screen [facility] 2/9     02/23/2023   11:07 AM 11/19/2022    2:37 PM 11/05/2022   10:07 AM 10/01/2022    2:54 PM 07/30/2022   11:19 AM 05/14/2022   11:20 AM 02/26/2022   12:22 PM  Depression screen PHQ 2/9  Decreased Interest 1 0 3 0 0 0 0  Down, Depressed, Hopeless 1 0 0 0 0 0 0  PHQ - 2 Score 2 0 3 0 0 0 0  Altered sleeping  0 3  0 2   Tired, decreased energy  0 3  0 2   Change in appetite  0 1  0 0   Feeling bad or failure about yourself   0 0  0 0   Trouble concentrating  0 0  0 2   Moving slowly or fidgety/restless  0 0  0 0   Suicidal thoughts  0 0  0 0   PHQ-9 Score  0 10  0 6   Difficult doing work/chores     Not difficult at all Somewhat difficult       Review of Systems  Musculoskeletal:  Positive for back pain.       B/L knee leg pain  All other systems reviewed and are negative.      Objective:   Physical Exam Vitals and nursing note reviewed.  Constitutional:      Appearance: Normal appearance.  Cardiovascular:     Rate and Rhythm: Normal rate and regular rhythm.     Pulses: Normal pulses.     Heart sounds: Normal heart sounds.  Pulmonary:     Effort: Pulmonary effort is normal.     Breath sounds: Normal breath sounds.  Musculoskeletal:  Cervical back: Normal range of motion and neck supple.     Comments: Normal Muscle Bulk and Muscle Testing Reveals:  Upper Extremities: Full ROM and Muscle Strength 5/5 Lumbar Paraspinal Tenderness: L-4-L-5 Lower Extremities : Full ROM and Muscle Strength 5/5 Arises from Table slowly Narrow Based Gait     Skin:    General: Skin is warm and dry.  Neurological:     Mental Status: She is alert and oriented to person, place, and time.  Psychiatric:        Mood and Affect: Mood normal.        Behavior: Behavior normal.         Assessment & Plan:  Lumbar Radiculitis: S/P on 03/25/2023: Dr Alvester Morin : Lumbosacral Transforaminal Epidural Steroid Injection - Sub-Pedicular Approach with Fluoroscopic Guidance , with Good relief noted  2. Lumbar Facet Arthropathy: Continue HEP as tolerated. Continue current medication regimen. Continue to Monitor.  3. Chronic Pain Syndrome: Continue current medication regimen. Oral Swab was Performed today: See HPI/ discussed with Dr Shearon Stalls   F/U with 1 month

## 2023-03-27 NOTE — Progress Notes (Signed)
Tracey Armstrong - 88 y.o. female MRN [id number]  Date of birth: 11-Jun-1935  Office Visit Note: Visit Date: 03/25/2023 PCP: [place], [name], MD Referred by: [place], [name], MD  Subjective: Chief Complaint  Patient presents with   Lower Back - Pain   HPI:  Tracey Armstrong is a 88 y.o. female who comes in today for planned repeat Bilateral L4-5  Lumbar Transforaminal epidural steroid injection with fluoroscopic guidance.  The patient has failed conservative care including home exercise, medications, time and activity modification.  This injection will be diagnostic and hopefully therapeutic.  Please see requesting physician notes for further details and justification. Patient received more than 50% pain relief from prior injection. She is having predominately left sided radicular pain but really both sides.  Also complaining of left ankle pain. Have asked her to follow up with Dr. Magnus Ivan in our office whom she see for orthopedic care. Ensured she made appointment.  Referring: Ellin Goodie, FNP    ROS Otherwise per HPI.  Assessment & Plan: Visit Diagnoses:    ICD-10-CM   1. Lumbar radiculopathy  M54.16 XR C-ARM NO REPORT    Epidural Steroid injection    methylPREDNISolone acetate (DEPO-MEDROL) injection 80 mg    2. Spinal stenosis of lumbar region without neurogenic claudication  M48.061     3. Spondylosis without myelopathy or radiculopathy, lumbar region  M47.816     4. Chronic left-sided low back pain with bilateral sciatica  M54.41    M54.42    G89.29       Plan: No additional findings.   Meds & Orders:  Meds ordered this encounter  Medications   methylPREDNISolone acetate (DEPO-MEDROL) injection 80 mg    Orders Placed This Encounter  Procedures   XR C-ARM NO REPORT   Epidural Steroid injection    Follow-up: Return for visit to requesting provider as needed.   Procedures: No procedures performed  Lumbosacral Transforaminal Epidural Steroid Injection -  Sub-Pedicular Approach with Fluoroscopic Guidance  Patient: Tracey Armstrong      Date of Birth: 20-Apr-1935 MRN: [id number] PCP: [place], [name], MD      Visit Date: 03/25/2023   Universal Protocol:    Date/Time: 03/25/2023  Consent Given By: the patient  Position: PRONE  Additional Comments: Vital signs were monitored before and after the procedure. Patient was prepped and draped in the usual sterile fashion. The correct patient, procedure, and site was verified.   Injection Procedure Details:   Procedure diagnoses: Lumbar radiculopathy [M54.16]    Meds Administered:  Meds ordered this encounter  Medications   methylPREDNISolone acetate (DEPO-MEDROL) injection 80 mg    Laterality: Bilateral  Location/Site: L4  Needle:5.0 in., 22 ga.  Short bevel or Quincke spinal needle  Needle Placement: Transforaminal  Findings:    -Comments: Excellent flow of contrast along the nerve, nerve root and into the epidural space.  Procedure Details: After squaring off the end-plates to get a true AP view, the C-arm was positioned so that an oblique view of the foramen as noted above was visualized. The target area is just inferior to the "nose of the scotty dog" or sub pedicular. The soft tissues overlying this structure were infiltrated with 2-3 ml. of 1% Lidocaine without Epinephrine.  The spinal needle was inserted toward the target using a "trajectory" view along the fluoroscope beam.  Under AP and lateral visualization, the needle was advanced so it did not puncture dura and was located close the 6 O'Clock position of the pedical  in AP tracterory. Biplanar projections were used to confirm position. Aspiration was confirmed to be negative for CSF and/or blood. A 1-2 ml. volume of Isovue-250 was injected and flow of contrast was noted at each level. Radiographs were obtained for documentation purposes.   After attaining the desired flow of contrast documented above, a 0.5 to 1.0 ml test  dose of 0.25% Marcaine was injected into each respective transforaminal space.  The patient was observed for 90 seconds post injection.  After no sensory deficits were reported, and normal lower extremity motor function was noted,   the above injectate was administered so that equal amounts of the injectate were placed at each foramen (level) into the transforaminal epidural space.   Additional Comments:  No complications occurred Dressing: 2 x 2 sterile gauze and Band-Aid    Post-procedure details: Patient was observed during the procedure. Post-procedure instructions were reviewed.  Patient left the clinic in stable condition.    Clinical History: CT LUMBAR SPINE WITHOUT CONTRAST   TECHNIQUE:  Multidetector CT imaging of the lumbar spine was performed without  intravenous contrast administration. Multiplanar CT image  reconstructions were also generated.   COMPARISON:  Lumbar spine radiographs 04/04/2020 and MRI 09/09/2018   FINDINGS:  Segmentation: 5 lumbar type vertebrae.   Alignment: Mild thoracolumbar levoscoliosis. Grade 1 anterolisthesis  of L4 on L5, unchanged from the prior MRI.   Vertebrae: L1 superior endplate compression fracture with 30%  vertebral body height loss and 4 mm retropulsion of the superior  endplate. A fracture line remains visible although there is  surrounding sclerosis. Hemangioma in the L4 vertebral body.   Paraspinal and other soft tissues: Aortic atherosclerosis without  aneurysm.   Disc levels: Disc bulging and posterior element hypertrophy result  in chronic mild-to-moderate spinal stenosis at L3-4 and moderate to  severe spinal stenosis at L4-5. No significant spinal stenosis  related to mild L1 retropulsion.   IMPRESSION:  1. Possibly subacute L1 compression fracture with 30% height loss  and 4 mm retropulsion. No associated spinal stenosis.  2. Chronic mild-to-moderate spinal stenosis at L3-4 and moderate to  severe spinal stenosis  at L4-5.  3. Aortic Atherosclerosis (ICD10-I70.0).    Electronically Signed    By: Sebastian Ache M.D.    On: 04/04/2020 17:39     Objective:  VS:  HT:    WT:   BMI:     BP:(!) 161/85  HR:(!) 101bpm  TEMP: ( )  RESP:  Physical Exam Vitals and nursing note reviewed.  Constitutional:      General: She is not in acute distress.    Appearance: Normal appearance. She is not ill-appearing.  HENT:     Head: Normocephalic and atraumatic.     Right Ear: External ear normal.     Left Ear: External ear normal.  Eyes:     Extraocular Movements: Extraocular movements intact.  Cardiovascular:     Rate and Rhythm: Normal rate.     Pulses: Normal pulses.  Pulmonary:     Effort: Pulmonary effort is normal. No respiratory distress.  Abdominal:     General: There is no distension.     Palpations: Abdomen is soft.  Musculoskeletal:        General: Tenderness present.     Cervical back: Neck supple.     Right lower leg: Edema present.     Left lower leg: Edema present.     Comments: Patient has good distal strength with no pain over the greater trochanters.  No clonus or focal weakness. Left ankle mild swelling no allodynia no bruising.   Skin:    Findings: No erythema, lesion or rash.  Neurological:     General: No focal deficit present.     Mental Status: She is alert and oriented to person, place, and time.     Sensory: No sensory deficit.     Motor: No weakness or abnormal muscle tone.     Coordination: Coordination normal.     Gait: Gait abnormal.  Psychiatric:        Mood and Affect: Mood normal.        Behavior: Behavior normal.      Imaging: No results found.

## 2023-03-31 DIAGNOSIS — M5416 Radiculopathy, lumbar region: Secondary | ICD-10-CM | POA: Diagnosis not present

## 2023-03-31 DIAGNOSIS — M25552 Pain in left hip: Secondary | ICD-10-CM | POA: Diagnosis not present

## 2023-03-31 DIAGNOSIS — G894 Chronic pain syndrome: Secondary | ICD-10-CM | POA: Diagnosis not present

## 2023-03-31 DIAGNOSIS — M5459 Other low back pain: Secondary | ICD-10-CM | POA: Diagnosis not present

## 2023-03-31 DIAGNOSIS — M47816 Spondylosis without myelopathy or radiculopathy, lumbar region: Secondary | ICD-10-CM | POA: Diagnosis not present

## 2023-03-31 DIAGNOSIS — R2689 Other abnormalities of gait and mobility: Secondary | ICD-10-CM | POA: Diagnosis not present

## 2023-03-31 LAB — DRUG TOX MONITOR 1 W/CONF, ORAL FLD

## 2023-03-31 LAB — DRUG TOX ALC METAB W/CON, ORAL FLD: Alcohol Metabolite: NEGATIVE ng/mL (ref <25)

## 2023-04-01 ENCOUNTER — Telehealth: Payer: Self-pay | Admitting: Registered Nurse

## 2023-04-01 MED ORDER — HYDROCODONE-ACETAMINOPHEN 5-325 MG PO TABS: 0.5000 | ORAL_TABLET | Freq: Two times a day (BID) | ORAL | 0 refills | Status: DC | PRN

## 2023-04-01 NOTE — Telephone Encounter (Signed)
PMP was Reviewed  Oral Swab was Reviewed. Hydrocodone e-scribed today Call placed to [name] regarding the above.  Educated on the [employer], she verbalizes understanding.

## 2023-04-02 DIAGNOSIS — M5416 Radiculopathy, lumbar region: Secondary | ICD-10-CM | POA: Diagnosis not present

## 2023-04-02 DIAGNOSIS — M47816 Spondylosis without myelopathy or radiculopathy, lumbar region: Secondary | ICD-10-CM | POA: Diagnosis not present

## 2023-04-02 DIAGNOSIS — R2689 Other abnormalities of gait and mobility: Secondary | ICD-10-CM | POA: Diagnosis not present

## 2023-04-02 DIAGNOSIS — M25552 Pain in left hip: Secondary | ICD-10-CM | POA: Diagnosis not present

## 2023-04-02 DIAGNOSIS — M5459 Other low back pain: Secondary | ICD-10-CM | POA: Diagnosis not present

## 2023-04-02 DIAGNOSIS — G894 Chronic pain syndrome: Secondary | ICD-10-CM | POA: Diagnosis not present

## 2023-04-09 DIAGNOSIS — M5416 Radiculopathy, lumbar region: Secondary | ICD-10-CM | POA: Diagnosis not present

## 2023-04-09 DIAGNOSIS — G894 Chronic pain syndrome: Secondary | ICD-10-CM | POA: Diagnosis not present

## 2023-04-09 DIAGNOSIS — M47816 Spondylosis without myelopathy or radiculopathy, lumbar region: Secondary | ICD-10-CM | POA: Diagnosis not present

## 2023-04-09 DIAGNOSIS — R2689 Other abnormalities of gait and mobility: Secondary | ICD-10-CM | POA: Diagnosis not present

## 2023-04-09 DIAGNOSIS — M5459 Other low back pain: Secondary | ICD-10-CM | POA: Diagnosis not present

## 2023-04-09 DIAGNOSIS — M25552 Pain in left hip: Secondary | ICD-10-CM | POA: Diagnosis not present

## 2023-04-14 DIAGNOSIS — M25552 Pain in left hip: Secondary | ICD-10-CM | POA: Diagnosis not present

## 2023-04-14 DIAGNOSIS — M5459 Other low back pain: Secondary | ICD-10-CM | POA: Diagnosis not present

## 2023-04-14 DIAGNOSIS — G894 Chronic pain syndrome: Secondary | ICD-10-CM | POA: Diagnosis not present

## 2023-04-14 DIAGNOSIS — M5416 Radiculopathy, lumbar region: Secondary | ICD-10-CM | POA: Diagnosis not present

## 2023-04-14 DIAGNOSIS — R2689 Other abnormalities of gait and mobility: Secondary | ICD-10-CM | POA: Diagnosis not present

## 2023-04-14 DIAGNOSIS — M47816 Spondylosis without myelopathy or radiculopathy, lumbar region: Secondary | ICD-10-CM | POA: Diagnosis not present

## 2023-04-15 ENCOUNTER — Encounter: Payer: Self-pay | Admitting: Neurology

## 2023-04-15 ENCOUNTER — Ambulatory Visit (INDEPENDENT_AMBULATORY_CARE_PROVIDER_SITE_OTHER): Payer: Medicare Other | Admitting: Neurology

## 2023-04-15 VITALS — BP 121/67 | HR 101 | Wt 168.6 lb

## 2023-04-15 DIAGNOSIS — Z87898 Personal history of other specified conditions: Secondary | ICD-10-CM

## 2023-04-15 DIAGNOSIS — G43009 Migraine without aura, not intractable, without status migrainosus: Secondary | ICD-10-CM

## 2023-04-15 DIAGNOSIS — M5416 Radiculopathy, lumbar region: Secondary | ICD-10-CM | POA: Diagnosis not present

## 2023-04-15 DIAGNOSIS — R569 Unspecified convulsions: Secondary | ICD-10-CM

## 2023-04-15 MED ORDER — GABAPENTIN 300 MG PO CAPS: ORAL_CAPSULE | ORAL | 11 refills | Status: DC

## 2023-04-15 MED ORDER — LEVETIRACETAM 500 MG PO TABS: ORAL_TABLET | ORAL | 11 refills | Status: DC

## 2023-04-15 NOTE — Patient Instructions (Signed)
Good to see you doing well.  Continue Levetiracetam (Keppra) 500mg  twice a day  2. Continue Gabapentin 300mg : take 2 capsules daily. May take 3 capsules daily if needed for severe pain  3. Continue regular exercises  4. Follow-up in 1 year, call for any changes   Seizure Precautions: 1. If medication has been prescribed for you to prevent seizures, take it exactly as directed.  Do not stop taking the medicine without talking to your doctor first, even if you have not had a seizure in a long time.   2. Avoid activities in which a seizure would cause danger to yourself or to others.  Don't operate dangerous machinery, swim alone, or climb in high or dangerous places, such as on ladders, roofs, or girders.  Do not drive unless your doctor says you may.  3. If you have any warning that you may have a seizure, lay down in a safe place where you can't hurt yourself.    4.  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   Please refer to the following link on the [employer]'s website for more information: http://www.epilepsyfoundation.org/answerplace/Social/driving/drivingu.cfm   5.  Maintain good sleep hygiene.  6.  Contact your doctor if you have any problems that may be related to the medicine you are taking.  7.  Call 911 and bring the patient back to the ED if:        A.  The seizure lasts longer than 5 minutes.       B.  The patient doesn't awaken shortly after the seizure  C.  The patient has new problems such as difficulty seeing, speaking or moving  D.  The patient was injured during the seizure  E.  The patient has a temperature over 102 F (39C)  F.  The patient vomited and now is having trouble breathing

## 2023-04-15 NOTE — Progress Notes (Signed)
NEUROLOGY FOLLOW UP OFFICE NOTE  Tracey Armstrong [id number] 07-22-1935  HISTORY OF PRESENT ILLNESS: I had the pleasure of seeing Tracey Armstrong in follow-up in the neurology clinic on 04/15/2023.  The patient was last seen 8 months ago for epilepsy, migraines,and radiculopathy. She is alone in the office today. Records and images were personally reviewed where available. Since her last visit, she is happy to report that she has been seizure-free since June 2022 on Levetiracetam 500mg  BID, no side effects. She denies any staring/unresponsive episodes, "electrical shocks," myoclonic jerks. The migraines are doing well as long as she takes Mg, B2, and B6. She is on Gabapentin for radiculopathy, she reports that she could not walk last November and was in the ER for low back pain. She saw Pain Management and was instructed to increase Gabapentin. She was previously on 300mg  daily. She reports taking 600mg  daily, sometimes she may take 900mg  if pain is more intense. No side effects. Her left leg still hurts, she always uses her walker and does physical therapy. Staff fills her pillbox and she takes her medications by herself. She does not drive.    History on Initial Assessment 05/16/2019:  This is an 88 year old right-handed woman with a history of hypertension, hyperlipidemia, anxiety, chronic pain, chronic migraine, right parietal meningioma, presenting for evaluation of "electrical sensations" that started 2 months ago. The first time it happened on 03/09/2019 she was on the 4th floor and felt like there was electricity all over her room. She went to the kitchen and recalls there was a storm outside. She recalls taking her medications out, then suddenly it felt like her whole body was being electrocuted. She felt weak and fell backward, hitting the left side of her head. They called EMS and called her son, she felt better by the time he got there 2-3 hours later. She did fine for several days until 5/30, she was  sitting then again felt the same electricity sensation. She left the room and felt like she would fall, again calling EMS. She reports her BP was 217/107. The next time this happened was 2-3 weeks ago, she got up one morning and could not walk. She then started walking backwards, unable to control herself, and fell back, hitting her left lower mid-back. She does not think she was confused during these episodes. There has been no loss of consciousness, tongue bite or incontinence. She has asked for 2 surge protectors in her room. One time she stretched her legs and felt funny. She thinks there have been electrical problems at [facility] because one time all the fire alarms went off.   Update 04/30/2021: who helps supplement the history today. The patient was last seen 2 years ago for "electrical sensations" feeling like her whole body was being electrocuted. Brain MRI without contrast done in 05/2019 showed unchanged 9 x 5mm right parietal meningioma, no acute changes. EEG in 04/2019 was normal. She was lost to follow-up and presents today after hospital admission in June 2022. She was admitted on 04/02/21 for confusion and disorientation. She was initially hypotensive but sepsis workup was negative. She was discharged home then returned on 04/09/21 with increased confusion, UA positive for nitrates and started on IV Ceftriaxone. While in the hospital, she had sudden onset unresponsiveness, she was awake but not following commands with left gaze deviation. As she was being given IV-tPA, stat EEG showed bifrontal periodic epileptiform discharges at 3Hz  consistent with nonconvulsive status epilepticus. With seizure resolution, there was  diffuse and right hemisphere slowing. Brain MRI x 2 did not show any acute changes, stable 1cm right parietal convexity meningioma. She was discharged home on Levetiracetam 500mg  BID.  She reports that she was taking Gabapentin 300mg  BID for the past year, started by Dr. Swaziland "because  I thought I was having seizures a year ago." On review of notes, Gabapentin was started for low back pain with radiculopathy. She had continued to have the "electrical shock" sensations and was convinced it was due to the electrical set-up on the third floor. She moved to the second floor a year ago and felt that the sensations were not as bad but still occurring at least once a week. She called [facility] a month ago when she had a bigger electrical shock. [facility] noted she was confused a bit but more due to fear that the switch sensation was very pronounced. When she was in the hospital the second time, she was hallucinating, telling her daughter-in-law, there was a rabbit on the floor. BP was initially elevated, [facility] reports that this was stabilized and she was getting ready for discharge when he was called 2 hours later that she was having a stroke, then later on told she had a seizure. Since hospital discharge a week ago, [facility] has noticed remarkable cognitive improvement. In the past (prior to her recent hospitalizations), she would have episodes where she would have difficulty signing her name or recall certain facts. She has not had the electrical sensations in the past week. She denies any side effects on Levetiracetam 500mg  BID. She reports migraines have been controlled with combination of magnesium, B1, and B6. She was unable to take them while in the hospital so she had a migraine recently, with good response to Maxalt. Tizanidine was also stopped while she was on antibiotics. She reports back pain, she fell in June 2021 and fractured her lumbar vertebra, then had another fall in August 2021 and broke her left femur. She ambulates with a walker and has regular physical therapy. She manages her own medications. She does not drive.   PAST MEDICAL HISTORY: Past Medical History:  Diagnosis Date   Anxiety    Arthritis    Chronic insomnia 03/30/2015   Chronic low back pain 12/02/2016   Depression    GERD  (gastroesophageal reflux disease)    Hiatal hernia    High cholesterol    Hypertelorism    Hypertension    Meningioma (HCC)    Migraine     MEDICATIONS: Current Outpatient Medications on File Prior to Visit  Medication Sig Dispense Refill   ALPRAZolam (XANAX) 1 MG tablet TAKE ONE-HALF TABLET BY MOUTH AT NOON AND 1 EVERY NIGHT AT BEDTIME. 45 tablet 3   ammonium lactate (LAC-HYDRIN) 12 % lotion Apply 1 Application topically daily as needed for dry skin (on heels). 222 g 0   antiseptic oral rinse (BIOTENE) LIQD Use 3 mls 3-4 times per day for dry mouth. Do not swallow. 237 mL 0   Cholecalciferol (VITAMIN D3) 1.25 MG (50000 UT) capsule TAKE 1 CAPSULE BY MOUTH EVERY 14 DAYS 7 capsule 1   Dextran 70-Hypromellose (ARTIFICIAL TEARS PF OP) Place 1-2 drops into both eyes See admin instructions. Instill 1-2 drops into both eyes at bedtime and an additional 2-3 times a day as needed for dryness     diclofenac (FLECTOR) 1.3 % PTCH Place 1 patch onto the skin 2 (two) times daily. 60 patch 1   diphenhydramine-acetaminophen (TYLENOL PM) 25-500 MG TABS tablet  Take 1.5 tablets by mouth at bedtime.     gabapentin (NEURONTIN) 300 MG capsule Take 2 capsules at nighttime for 2 weeks. If additional control needed, can increase to 3 capsules at nighttime. 90 capsule 2   HYDROcodone-acetaminophen (NORCO) 5-325 MG tablet Take 0.5-1 tablets by mouth 2 (two) times daily as needed for severe pain. 45 tablet 0   levETIRAcetam (KEPPRA) 500 MG tablet TAKE 1 TABLET(500 MG) BY MOUTH TWICE DAILY 60 tablet 0   Magnesium 400 MG TABS Take 400 mg by mouth at bedtime.     MAXALT-MLT 10 MG disintegrating tablet DISSOLVE ONE TABLET BY MOUTH AS NEEDED HEADACHE. 9 tablet 11   Melatonin 10 MG TABS Take 10 mg by mouth at bedtime as needed (for sleep).     omeprazole (PRILOSEC OTC) 20 MG tablet Take 20 mg by mouth daily before breakfast.     oxybutynin (DITROPAN-XL) 10 MG 24 hr tablet TAKE 1 TABLET(10 MG) BY MOUTH AT BEDTIME 90 tablet 1    POTASSIUM PO Take 1 tablet by mouth daily with lunch.     pravastatin (PRAVACHOL) 20 MG tablet Take 1 tablet (20 mg total) by mouth 3 (three) times a week. 13 tablet 2   pyridOXINE (VITAMIN B-6) 100 MG tablet Take 400 mg by mouth daily with lunch.     riboflavin (VITAMIN B-2) 100 MG TABS tablet Take 400 mg by mouth daily with lunch.     tretinoin (RETIN-A) 0.1 % cream APPLY TO AFFECTED AREA EVERY DAY AT BEDTIME 45 g 2   verapamil (VERELAN PM) 180 MG 24 hr capsule TAKE 1 CAPSULE(180 MG) BY MOUTH DAILY 90 capsule 3   No current facility-administered medications on file prior to visit.    ALLERGIES: Allergies  Allergen Reactions   Dihydrotachysterol Other (See Comments)    Dihydrotachysterol is a form of vitamin D. Might be "DHT Intensol" (Name Brand)- Reaction unknown- tolerating 50,000 units of Vitamin D-2 (Drisdol) in 2022   Trazodone And Nefazodone Other (See Comments)    Worsens headaches   Valium [Diazepam] Other (See Comments)    headaches   Citalopram Other (See Comments)    Other reaction(s): Headache   Other Other (See Comments)    "States all antidepressants cause migraines." - tolerates Xanax     FAMILY HISTORY: Family History  Problem Relation Age of Onset   Heart Problems Mother    Migraines Father    Stroke Father    Epilepsy Brother    Cancer Brother 8       unknown type - metastatic    SOCIAL HISTORY: Social History   Socioeconomic History   Marital status: Widowed    Spouse name: Not on file   Number of children: 4   Years of education: 17   Highest education level: Not on file  Occupational History   Occupation: Retired  Tobacco Use   Smoking status: Never   Smokeless tobacco: Never  Vaping Use   Vaping Use: Never used  Substance and Sexual Activity   Alcohol use: No   Drug use: No   Sexual activity: Not Currently  Other Topics Concern   Not on file  Social History Narrative   Lives in [facility]      Patient drinks 1 glass of  tea daily.      Patient is right handed.   Social Determinants of Health   Financial Resource Strain: Low Risk  (11/19/2022)   Overall Financial Resource Strain (CARDIA)  Difficulty of Paying Living Expenses: Not hard at all  Food Insecurity: No Food Insecurity (11/19/2022)   Hunger Vital Sign    Worried About Running Out of Food in the Last Year: Never true    Ran Out of Food in the Last Year: Never true  Transportation Needs: No Transportation Needs (11/19/2022)   PRAPARE - Administrator, Civil Service (Medical): No    Lack of Transportation (Non-Medical): No  Physical Activity: Insufficiently Active (11/19/2022)   Exercise Vital Sign    Days of Exercise per Week: 2 days    Minutes of Exercise per Session: 60 min  Stress: No Stress Concern Present (11/19/2022)   [employer] of Occupational Health - Occupational Stress Questionnaire    Feeling of Stress : Not at all  Social Connections: Moderately Integrated (11/19/2022)   Social Connection and Isolation Panel [NHANES]    Frequency of Communication with Friends and Family: More than three times a week    Frequency of Social Gatherings with Friends and Family: More than three times a week    Attends Religious Services: More than 4 times per year    Active Member of [employer] or Organizations: Yes    Attends Banker Meetings: More than 4 times per year    Marital Status: Widowed  Intimate Partner Violence: Not At Risk (11/19/2022)   Humiliation, Afraid, Rape, and Kick questionnaire    Fear of Current or Ex-Partner: No    Emotionally Abused: No    Physically Abused: No    Sexually Abused: No     PHYSICAL EXAM: Vitals:   04/15/23 0952  BP: 121/67  Pulse: (!) 101  SpO2: 94%   General: No acute distress Head:  Normocephalic/atraumatic Skin/Extremities: No rash, no edema Neurological Exam: alert and awake. No aphasia or dysarthria. Fund of knowledge is appropriate.  Attention and concentration are  reduced, repeats herself on occasion. Cranial nerves: Pupils equal, round. Extraocular movements intact with no nystagmus. Visual fields full.  No facial asymmetry.  Motor: Bulk and tone normal, muscle strength 5/5 throughout with no pronator drift.   Finger to nose testing intact.  Gait slow and cautious favoring left leg with walker, no ataxia.    IMPRESSION: This is an 88 yo RH woman with a history of hypertension, hyperlipidemia, anxiety, chronic pain, chronic migraine, right parietal meningioma, with new onset status epilepticus last June 2022. She presented with confusion, then became unresponsive with left gaze deviation. EEG showed generalized maximal bifrontal periodic epileptiform discharges at 3 Hz consistent with status epilepticus, then EEG changes resolved followed by diffuse slowing with additional right hemisphere slowing. MRI brain x 2 no acute changes, no change in small 1cm right parietal meningioma. Etiology of seizure unclear, it is unlikely the very small right parietal meningioma is contributing. She had a UTI at that time as well. No seizures since June 2022, continue Levetiracetam 500mg  BID. She is on Gabapentin for migraines and radiculopathy, she is on 600mg  daily, and may take additional 300mg  for severe pain. Continue close supervision. She does not drive. Follow-up in 1 year, call for any changes.   Thank you for allowing me to participate in her care.  Please do not hesitate to call for any questions or concerns.   Patrcia Dolly, M.D.   CC: Dr. Swaziland

## 2023-04-16 DIAGNOSIS — M5459 Other low back pain: Secondary | ICD-10-CM | POA: Diagnosis not present

## 2023-04-16 DIAGNOSIS — G894 Chronic pain syndrome: Secondary | ICD-10-CM | POA: Diagnosis not present

## 2023-04-16 DIAGNOSIS — M5416 Radiculopathy, lumbar region: Secondary | ICD-10-CM | POA: Diagnosis not present

## 2023-04-16 DIAGNOSIS — M47816 Spondylosis without myelopathy or radiculopathy, lumbar region: Secondary | ICD-10-CM | POA: Diagnosis not present

## 2023-04-16 DIAGNOSIS — M25552 Pain in left hip: Secondary | ICD-10-CM | POA: Diagnosis not present

## 2023-04-16 DIAGNOSIS — R2689 Other abnormalities of gait and mobility: Secondary | ICD-10-CM | POA: Diagnosis not present

## 2023-04-18 ENCOUNTER — Other Ambulatory Visit: Payer: Self-pay | Admitting: Physical Medicine and Rehabilitation

## 2023-04-20 ENCOUNTER — Other Ambulatory Visit: Payer: Self-pay | Admitting: Family Medicine

## 2023-04-20 DIAGNOSIS — F411 Generalized anxiety disorder: Secondary | ICD-10-CM

## 2023-04-21 DIAGNOSIS — M5416 Radiculopathy, lumbar region: Secondary | ICD-10-CM | POA: Diagnosis not present

## 2023-04-21 DIAGNOSIS — M5459 Other low back pain: Secondary | ICD-10-CM | POA: Diagnosis not present

## 2023-04-21 DIAGNOSIS — M25552 Pain in left hip: Secondary | ICD-10-CM | POA: Diagnosis not present

## 2023-04-21 DIAGNOSIS — M47816 Spondylosis without myelopathy or radiculopathy, lumbar region: Secondary | ICD-10-CM | POA: Diagnosis not present

## 2023-04-21 DIAGNOSIS — R2689 Other abnormalities of gait and mobility: Secondary | ICD-10-CM | POA: Diagnosis not present

## 2023-04-21 DIAGNOSIS — G894 Chronic pain syndrome: Secondary | ICD-10-CM | POA: Diagnosis not present

## 2023-04-24 ENCOUNTER — Telehealth: Payer: Self-pay | Admitting: Family Medicine

## 2023-04-24 DIAGNOSIS — I700 Atherosclerosis of aorta: Secondary | ICD-10-CM

## 2023-04-24 NOTE — Telephone Encounter (Signed)
Prescription Request  04/24/2023  LOV: 02/11/2023  What is the name of the medication or equipment?  pravastatin (PRAVACHOL) 20 MG tablet  Person calling (Caller ID: Alonza Smoker [phone]) demanded to know why MD is not automatically sending refills? Person calling demanded this Rx be sent TODAY!! Person informed MD is OOO today.  Person calling said just let them know we need it TODAY!!   Have you contacted your pharmacy to request a refill? No   Which pharmacy would you like this sent to?  [id number] [place],  - [address] AT [facility] [address] [facility] [address] [place] [address] Phone: [phone] Fax: [phone]    Patient notified that their request is being sent to the clinical staff for review and that they should receive a response within 2 business days.

## 2023-04-27 ENCOUNTER — Encounter: Payer: Medicare Other | Admitting: Registered Nurse

## 2023-04-27 MED ORDER — PRAVASTATIN SODIUM 20 MG PO TABS
20.0000 mg | ORAL_TABLET | ORAL | 5 refills | Status: DC
Start: 2023-04-27 — End: 2023-10-23

## 2023-04-27 NOTE — Telephone Encounter (Signed)
Rx was sent back in April, pt should not be out as she only takes it 3x a week. Rx re-sent to pharmacy.

## 2023-04-28 DIAGNOSIS — M25552 Pain in left hip: Secondary | ICD-10-CM | POA: Diagnosis not present

## 2023-04-28 DIAGNOSIS — G894 Chronic pain syndrome: Secondary | ICD-10-CM | POA: Diagnosis not present

## 2023-04-28 DIAGNOSIS — M5416 Radiculopathy, lumbar region: Secondary | ICD-10-CM | POA: Diagnosis not present

## 2023-04-28 DIAGNOSIS — M47816 Spondylosis without myelopathy or radiculopathy, lumbar region: Secondary | ICD-10-CM | POA: Diagnosis not present

## 2023-04-28 DIAGNOSIS — M5459 Other low back pain: Secondary | ICD-10-CM | POA: Diagnosis not present

## 2023-04-28 DIAGNOSIS — R2689 Other abnormalities of gait and mobility: Secondary | ICD-10-CM | POA: Diagnosis not present

## 2023-04-30 DIAGNOSIS — M5416 Radiculopathy, lumbar region: Secondary | ICD-10-CM | POA: Diagnosis not present

## 2023-04-30 DIAGNOSIS — R2689 Other abnormalities of gait and mobility: Secondary | ICD-10-CM | POA: Diagnosis not present

## 2023-04-30 DIAGNOSIS — M25552 Pain in left hip: Secondary | ICD-10-CM | POA: Diagnosis not present

## 2023-04-30 DIAGNOSIS — M47816 Spondylosis without myelopathy or radiculopathy, lumbar region: Secondary | ICD-10-CM | POA: Diagnosis not present

## 2023-04-30 DIAGNOSIS — G894 Chronic pain syndrome: Secondary | ICD-10-CM | POA: Diagnosis not present

## 2023-04-30 DIAGNOSIS — M5459 Other low back pain: Secondary | ICD-10-CM | POA: Diagnosis not present

## 2023-05-04 ENCOUNTER — Ambulatory Visit: Payer: Medicare Other | Admitting: Orthopaedic Surgery

## 2023-05-04 ENCOUNTER — Ambulatory Visit: Payer: Medicare Other | Admitting: Physician Assistant

## 2023-05-05 DIAGNOSIS — M5459 Other low back pain: Secondary | ICD-10-CM | POA: Diagnosis not present

## 2023-05-05 DIAGNOSIS — M25552 Pain in left hip: Secondary | ICD-10-CM | POA: Diagnosis not present

## 2023-05-05 DIAGNOSIS — R2689 Other abnormalities of gait and mobility: Secondary | ICD-10-CM | POA: Diagnosis not present

## 2023-05-05 DIAGNOSIS — G894 Chronic pain syndrome: Secondary | ICD-10-CM | POA: Diagnosis not present

## 2023-05-05 DIAGNOSIS — M5416 Radiculopathy, lumbar region: Secondary | ICD-10-CM | POA: Diagnosis not present

## 2023-05-05 DIAGNOSIS — M47816 Spondylosis without myelopathy or radiculopathy, lumbar region: Secondary | ICD-10-CM | POA: Diagnosis not present

## 2023-05-13 ENCOUNTER — Ambulatory Visit (INDEPENDENT_AMBULATORY_CARE_PROVIDER_SITE_OTHER): Payer: Medicare Other | Admitting: Orthopaedic Surgery

## 2023-05-13 ENCOUNTER — Encounter: Payer: Self-pay | Admitting: Orthopaedic Surgery

## 2023-05-13 ENCOUNTER — Other Ambulatory Visit (INDEPENDENT_AMBULATORY_CARE_PROVIDER_SITE_OTHER): Payer: Medicare Other

## 2023-05-13 DIAGNOSIS — M1712 Unilateral primary osteoarthritis, left knee: Secondary | ICD-10-CM

## 2023-05-13 DIAGNOSIS — M7062 Trochanteric bursitis, left hip: Secondary | ICD-10-CM

## 2023-05-13 MED ORDER — METHYLPREDNISOLONE ACETATE 40 MG/ML IJ SUSP
40.0000 mg | INTRAMUSCULAR | Status: AC | PRN
Start: 2023-05-13 — End: 2023-05-13
  Administered 2023-05-13: 40 mg via INTRA_ARTICULAR

## 2023-05-13 MED ORDER — LIDOCAINE HCL 1 % IJ SOLN
3.0000 mL | INTRAMUSCULAR | Status: AC | PRN
Start: 2023-05-13 — End: 2023-05-13
  Administered 2023-05-13: 3 mL

## 2023-05-13 NOTE — Progress Notes (Signed)
Office Visit Note   Patient: Tracey Armstrong           Date of Birth: Jul 16, 1935           MRN: [id number] Visit Date: 05/13/2023              Requested by: Swaziland, [name], MD [address] [place],  [place] [address] PCP: Swazil[place], [name], MD   Assessment & Plan: Visit Diagnoses:  1. Unilateral primary osteoarthritis, left knee   2. Trochanteric bursitis, left hip     Plan: She shown IT band stretching exercises.  She understands that it would be early September before she can have another cortisone injection in her left knee.  Questions were encouraged and answered at length.  For pain in the left femur does not resolve with the injection and she is having more pain in the mid femur would recommend repeat CT scan to evaluate for nonunion.  Follow-Up Instructions: Return if symptoms worsen or fail to improve.  Orders:  Orders Placed This Encounter  Procedures   Large Joint Inj   XR FEMUR MIN 2 VIEWS LEFT   XR Knee 1-2 Views Left   No orders of the defined types were placed in this encounter.     Procedures: Large Joint Inj: L greater trochanter on 05/13/2023 11:04 AM Indications: pain Details: 22 G 1.5 in needle, lateral approach  Arthrogram: No  Medications: 3 mL lidocaine 1 %; 40 mg methylPREDNISolone acetate 40 MG/ML Outcome: tolerated well, no immediate complications Procedure, treatment alternatives, risks and benefits explained, specific risks discussed. Consent was given by the patient. Immediately prior to procedure a time out was called to verify the correct patient, procedure, equipment, support staff and site/side marked as required. Patient was prepped and draped in the usual sterile fashion.       Clinical Data: No additional findings.   Subjective: Chief Complaint  Patient presents with   Left Knee - Pain   Lower Back - Pain    HPI Tracey Armstrong comes in today due to left leg pain.  She has pain in her left hip, mid femur and knee.  She  states that cortisone injection in the left knee on 03/23/2023 gave her no relief.  When she is someone who underwent IM nailing of a left femur fracture by surgeon here in town on 06/02/2020.  She is she has had pain in the left leg ever since undergoing the IM nailing.  She underwent a CT scan of the left femur and October 2022 and this showed solid osseous bridging across the posterior aspect of the fracture.  Anterior fracture segment also showed some osseous bridging at the proximal and distal margins but at the midportion the cortical margins appeared to be consistent with a nonunion.    Review of Systems See HPI otherwise negative  Objective: Vital Signs: There were no vitals taken for this visit.  Physical Exam Constitutional:      Appearance: She is not ill-appearing or diaphoretic.  Pulmonary:     Effort: Pulmonary effort is normal.  Neurological:     Mental Status: She is alert and oriented to person, place, and time.     Ortho Exam Bilateral hips good range of motion both hips.  Left hip she has pain with extremes of internal/external rotation.  No significant tenderness midshaft of the left femur.  Tenderness along medial joint line of the left knee.  No abnormal warmth erythema or effusion left knee.  Overall good range of motion left knee.  Ambulates with a rolling walker.  She requires 2+ assistance to get on and off the exam table. Specialty Comments:  CT LUMBAR SPINE WITHOUT CONTRAST   TECHNIQUE:  Multidetector CT imaging of the lumbar spine was performed without  intravenous contrast administration. Multiplanar CT image  reconstructions were also generated.   COMPARISON:  Lumbar spine radiographs 04/04/2020 and MRI 09/09/2018   FINDINGS:  Segmentation: 5 lumbar type vertebrae.   Alignment: Mild thoracolumbar levoscoliosis. Grade 1 anterolisthesis  of L4 on L5, unchanged from the prior MRI.   Vertebrae: L1 superior endplate compression fracture with 30%   vertebral body height loss and 4 mm retropulsion of the superior  endplate. A fracture line remains visible although there is  surrounding sclerosis. Hemangioma in the L4 vertebral body.   Paraspinal and other soft tissues: Aortic atherosclerosis without  aneurysm.   Disc levels: Disc bulging and posterior element hypertrophy result  in chronic mild-to-moderate spinal stenosis at L3-4 and moderate to  severe spinal stenosis at L4-5. No significant spinal stenosis  related to mild L1 retropulsion.   IMPRESSION:  1. Possibly subacute L1 compression fracture with 30% height loss  and 4 mm retropulsion. No associated spinal stenosis.  2. Chronic mild-to-moderate spinal stenosis at L3-4 and moderate to  severe spinal stenosis at L4-5.  3. Aortic Atherosclerosis (ICD10-I70.0).    Electronically Signed    By: Sebastian Ache M.D.    On: 04/04/2020 17:39  Imaging: XR FEMUR MIN 2 VIEWS LEFT  Result Date: 05/13/2023 Left femur multiple views: Hips well located.  Knee is well located.  Status post IM nailing.  Mid shaft fracture there is still slight lucencies best seen on the AP view could represent a nonunion.  Otherwise good bridging callus about the fracture site.  No acute findings.  Proximal hip screw remains unchanged from prior films.  XR Knee 1-2 Views Left  Result Date: 05/13/2023 Left knee 2 views: No acute fractures or acute injuries.  No hardware IM nailing femur fracture is without any complicating features.  Knee is overall well located.    PMFS History: Patient Active Problem List   Diagnosis Date Noted   Urge incontinence of urine 02/11/2023   Xerosis of skin 02/11/2023   Lumbar facet arthropathy 11/05/2022   Seizures (HCC) 11/05/2022   Personal history of fall 11/05/2022   Unilateral primary osteoarthritis, left knee 04/14/2022   Myalgia due to statin 02/26/2022   Atherosclerosis of aorta (HCC) 07/25/2021   Urinary tract infection due to ESBL Klebsiella 04/13/2021    Acute metabolic encephalopathy 04/09/2021   Hypertensive urgency 04/09/2021   Lactic acidosis 04/09/2021   Lab test positive for detection of COVID-19 virus 04/09/2021   Abnormal urinalysis 04/09/2021   Lumbar radiculopathy 02/06/2021   Pes anserinus bursitis of left knee 09/03/2020   Trochanteric bursitis of left hip 09/03/2020   Closed intertrochanteric fracture of left femur (HCC) 07/18/2020   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   Synovial cyst of lumbar facet joint 04/05/2020   Spinal stenosis of lumbar region with neurogenic claudication 04/05/2020   Dehydration 04/05/2020   Closed wedge compression fracture of L1 vertebra (HCC) 04/04/2020   Ambulatory dysfunction 04/04/2020   Intractable low back pain 04/04/2020   L1 vertebral fracture (HCC) 04/04/2020   Urine, incontinence, stress female 02/07/2019   Knee osteoarthritis 02/01/2018   Iron deficiency anemia 10/28/2017   Rhinitis, allergic 07/27/2017   GERD without esophagitis 06/16/2017  Chronic pain disorder 06/16/2017   Vitamin D deficiency, unspecified 05/25/2017   Hyperlipidemia 05/25/2017   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   Pain 02/26/2017   Chronic low back pain 12/02/2016   Abdominal pain 03/26/2016   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   Peptic esophagitis 03/26/2016   Neuralgia neuritis, sciatic nerve 03/26/2016   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   Chronic insomnia 03/30/2015   Chronic migraine 01/10/2015   Depression 04/20/2014   Generalized anxiety disorder 04/20/2014   Chronic back pain 04/20/2014   Chronic daily headache 04/20/2014   Essential hypertension 04/20/2014   Migraine headache 04/19/2014   Past Medical History:  Diagnosis Date   Anxiety    Arthritis    Chronic insomnia 03/30/2015   Chronic low back pain 12/02/2016   Depression    GERD (gastroesophageal reflux disease)    Hiatal hernia    High cholesterol    Hypertelorism    Hypertension    Meningioma (HCC)     Migraine     Family History  Problem Relation Age of Onset   Heart Problems Mother    Migraines Father    Stroke Father    Epilepsy Brother    Cancer Brother 41       unknown type - metastatic    Past Surgical History:  Procedure Laterality Date   FEMUR IM NAIL Left 06/02/2020   Procedure: INTRAMEDULLARY (IM) RETROGRADE FEMORAL NAILING - LEFT;  Surgeon: Samson Frederic, MD;  Location: [facility] OR;  Service: Orthopedics;  Laterality: Left;   STOMACH SURGERY     Social History   Occupational History   Occupation: Retired  Tobacco Use   Smoking status: Never   Smokeless tobacco: Never  Vaping Use   Vaping status: Never Used  Substance and Sexual Activity   Alcohol use: No   Drug use: No   Sexual activity: Not Currently

## 2023-05-14 DIAGNOSIS — M25552 Pain in left hip: Secondary | ICD-10-CM | POA: Diagnosis not present

## 2023-05-14 DIAGNOSIS — M5416 Radiculopathy, lumbar region: Secondary | ICD-10-CM | POA: Diagnosis not present

## 2023-05-14 DIAGNOSIS — R2689 Other abnormalities of gait and mobility: Secondary | ICD-10-CM | POA: Diagnosis not present

## 2023-05-14 DIAGNOSIS — M47816 Spondylosis without myelopathy or radiculopathy, lumbar region: Secondary | ICD-10-CM | POA: Diagnosis not present

## 2023-05-14 DIAGNOSIS — G894 Chronic pain syndrome: Secondary | ICD-10-CM | POA: Diagnosis not present

## 2023-05-14 DIAGNOSIS — M5459 Other low back pain: Secondary | ICD-10-CM | POA: Diagnosis not present

## 2023-05-15 DIAGNOSIS — G894 Chronic pain syndrome: Secondary | ICD-10-CM | POA: Diagnosis not present

## 2023-05-15 DIAGNOSIS — M25552 Pain in left hip: Secondary | ICD-10-CM | POA: Diagnosis not present

## 2023-05-15 DIAGNOSIS — R2689 Other abnormalities of gait and mobility: Secondary | ICD-10-CM | POA: Diagnosis not present

## 2023-05-15 DIAGNOSIS — M47816 Spondylosis without myelopathy or radiculopathy, lumbar region: Secondary | ICD-10-CM | POA: Diagnosis not present

## 2023-05-15 DIAGNOSIS — M5416 Radiculopathy, lumbar region: Secondary | ICD-10-CM | POA: Diagnosis not present

## 2023-05-15 DIAGNOSIS — M5459 Other low back pain: Secondary | ICD-10-CM | POA: Diagnosis not present

## 2023-05-18 ENCOUNTER — Encounter: Payer: Self-pay | Admitting: *Deleted

## 2023-05-18 ENCOUNTER — Telehealth: Payer: Self-pay | Admitting: *Deleted

## 2023-05-18 NOTE — Telephone Encounter (Signed)
Opened in error

## 2023-05-18 NOTE — Telephone Encounter (Signed)
Letter sent via MyChart reminding patient of importance of medication compliance per MD request.

## 2023-05-19 DIAGNOSIS — M5459 Other low back pain: Secondary | ICD-10-CM | POA: Diagnosis not present

## 2023-05-19 DIAGNOSIS — M47816 Spondylosis without myelopathy or radiculopathy, lumbar region: Secondary | ICD-10-CM | POA: Diagnosis not present

## 2023-05-19 DIAGNOSIS — R2689 Other abnormalities of gait and mobility: Secondary | ICD-10-CM | POA: Diagnosis not present

## 2023-05-19 DIAGNOSIS — M5416 Radiculopathy, lumbar region: Secondary | ICD-10-CM | POA: Diagnosis not present

## 2023-05-19 DIAGNOSIS — M25552 Pain in left hip: Secondary | ICD-10-CM | POA: Diagnosis not present

## 2023-05-19 DIAGNOSIS — G894 Chronic pain syndrome: Secondary | ICD-10-CM | POA: Diagnosis not present

## 2023-05-20 ENCOUNTER — Ambulatory Visit: Payer: Medicare Other | Admitting: Registered Nurse

## 2023-05-20 ENCOUNTER — Encounter: Payer: Medicare Other | Admitting: Registered Nurse

## 2023-05-20 DIAGNOSIS — M47816 Spondylosis without myelopathy or radiculopathy, lumbar region: Secondary | ICD-10-CM | POA: Diagnosis not present

## 2023-05-20 DIAGNOSIS — M5459 Other low back pain: Secondary | ICD-10-CM | POA: Diagnosis not present

## 2023-05-20 DIAGNOSIS — G894 Chronic pain syndrome: Secondary | ICD-10-CM | POA: Diagnosis not present

## 2023-05-20 DIAGNOSIS — M25552 Pain in left hip: Secondary | ICD-10-CM | POA: Diagnosis not present

## 2023-05-20 DIAGNOSIS — M5416 Radiculopathy, lumbar region: Secondary | ICD-10-CM | POA: Diagnosis not present

## 2023-05-20 DIAGNOSIS — R2689 Other abnormalities of gait and mobility: Secondary | ICD-10-CM | POA: Diagnosis not present

## 2023-05-27 ENCOUNTER — Encounter: Payer: Medicare Other | Attending: Physical Medicine and Rehabilitation | Admitting: Registered Nurse

## 2023-05-27 ENCOUNTER — Ambulatory Visit: Payer: Medicare Other | Admitting: Registered Nurse

## 2023-05-27 ENCOUNTER — Encounter: Payer: Self-pay | Admitting: Registered Nurse

## 2023-05-27 VITALS — BP 117/76 | HR 86 | Ht 62.0 in | Wt 165.0 lb

## 2023-05-27 DIAGNOSIS — M47816 Spondylosis without myelopathy or radiculopathy, lumbar region: Secondary | ICD-10-CM

## 2023-05-27 DIAGNOSIS — Z5181 Encounter for therapeutic drug level monitoring: Secondary | ICD-10-CM

## 2023-05-27 DIAGNOSIS — G894 Chronic pain syndrome: Secondary | ICD-10-CM | POA: Diagnosis not present

## 2023-05-27 DIAGNOSIS — G8929 Other chronic pain: Secondary | ICD-10-CM | POA: Diagnosis not present

## 2023-05-27 DIAGNOSIS — M25562 Pain in left knee: Secondary | ICD-10-CM

## 2023-05-27 DIAGNOSIS — M5416 Radiculopathy, lumbar region: Secondary | ICD-10-CM

## 2023-05-27 DIAGNOSIS — M25561 Pain in right knee: Secondary | ICD-10-CM | POA: Diagnosis not present

## 2023-05-27 DIAGNOSIS — Z79899 Other long term (current) drug therapy: Secondary | ICD-10-CM

## 2023-05-27 MED ORDER — HYDROCODONE-ACETAMINOPHEN 5-325 MG PO TABS: 0.5000 | ORAL_TABLET | Freq: Two times a day (BID) | ORAL | 0 refills | Status: DC | PRN

## 2023-05-27 NOTE — Progress Notes (Signed)
Subjective:    Patient ID: Tracey Armstrong, female    DOB: 1935/05/15, 88 y.o.   MRN: [id number]  HPI: Tracey Armstrong is a 88 y.o. female who returns for follow up appointment for chronic pain and medication refill. She states her pain is located in her lower back and bilateral knee pain. She rates her pain 7. Her current exercise regime is walking and performing stretching exercises.  [name] Morphine equivalent is 10.00 MME. She is also prescribed Alprazolam  by Dr. Swaziland .We have discussed the black box warning of using opioids and benzodiazepines. I highlighted the dangers of using these drugs together and discussed the adverse events including respiratory suppression, overdose,   Last Oral Swab was Performed on 03/27/2023, it was consistent.       Pain Inventory Average Pain 9 Pain Right Now 7 My pain is sharp, burning, stabbing, and aching  In the last 24 hours, has pain interfered with the following? General activity 7 Relation with others 7 Enjoyment of life 7 What TIME of day is your pain at its worst? evening Sleep (in general) Good  Pain is worse with: walking, bending, inactivity, and standing Pain improves with: rest, therapy/exercise, medication, and injections Relief from Meds: 8  Family History  Problem Relation Age of Onset   Heart Problems Mother    Migraines Father    Stroke Father    Epilepsy Brother    Cancer Brother 31       unknown type - metastatic   Social History   Socioeconomic History   Marital status: Widowed    Spouse name: Not on file   Number of children: 4   Years of education: 38   Highest education level: Not on file  Occupational History   Occupation: Retired  Tobacco Use   Smoking status: Never   Smokeless tobacco: Never  Vaping Use   Vaping status: Never Used  Substance and Sexual Activity   Alcohol use: No   Drug use: No   Sexual activity: Not Currently  Other Topics Concern   Not on file  Social History Narrative    Lives in [facility] - [facility]      Patient drinks 1 glass of tea daily.      Patient is right handed.   Social Determinants of Health   Financial Resource Strain: Low Risk  (11/19/2022)   Overall Financial Resource Strain (CARDIA)    Difficulty of Paying Living Expenses: Not hard at all  Food Insecurity: No Food Insecurity (11/19/2022)   Hunger Vital Sign    Worried About Running Out of Food in the Last Year: Never true    Ran Out of Food in the Last Year: Never true  Transportation Needs: No Transportation Needs (11/19/2022)   PRAPARE - Administrator, Civil Service (Medical): No    Lack of Transportation (Non-Medical): No  Physical Activity: Insufficiently Active (11/19/2022)   Exercise Vital Sign    Days of Exercise per Week: 2 days    Minutes of Exercise per Session: 60 min  Stress: No Stress Concern Present (11/19/2022)   [employer] of Occupational Health - Occupational Stress Questionnaire    Feeling of Stress : Not at all  Social Connections: Moderately Integrated (11/19/2022)   Social Connection and Isolation Panel [NHANES]    Frequency of Communication with Friends and Family: More than three times a week    Frequency of Social Gatherings with Friends and Family: More than three times a week  Attends Religious Services: More than 4 times per year    Active Member of Clubs or Organizations: Yes    Attends Banker Meetings: More than 4 times per year    Marital Status: Widowed   Past Surgical History:  Procedure Laterality Date   FEMUR IM NAIL Left 06/02/2020   Procedure: INTRAMEDULLARY (IM) RETROGRADE FEMORAL NAILING - LEFT;  Surgeon: Samson Frederic, MD;  Location: [facility] OR;  Service: Orthopedics;  Laterality: Left;   STOMACH SURGERY     Past Surgical History:  Procedure Laterality Date   FEMUR IM NAIL Left 06/02/2020   Procedure: INTRAMEDULLARY (IM) RETROGRADE FEMORAL NAILING - LEFT;  Surgeon: Samson Frederic, MD;  Location: [facility] OR;   Service: Orthopedics;  Laterality: Left;   STOMACH SURGERY     Past Medical History:  Diagnosis Date   Anxiety    Arthritis    Chronic insomnia 03/30/2015   Chronic low back pain 12/02/2016   Depression    GERD (gastroesophageal reflux disease)    Hiatal hernia    High cholesterol    Hypertelorism    Hypertension    Meningioma (HCC)    Migraine    BP 117/76   Pulse 86   Ht 5\' 2"  (1.575 m)   Wt 165 lb (74.8 kg)   SpO2 93%   BMI 30.18 kg/m   Opioid Risk Score:   Fall Risk Score:  `1  Depression screen [facility] 2/9     03/27/2023   10:59 AM 02/23/2023   11:07 AM 11/19/2022    2:37 PM 11/05/2022   10:07 AM 10/01/2022    2:54 PM 07/30/2022   11:19 AM 05/14/2022   11:20 AM  Depression screen PHQ 2/9  Decreased Interest 0 1 0 3 0 0 0  Down, Depressed, Hopeless 0 1 0 0 0 0 0  PHQ - 2 Score 0 2 0 3 0 0 0  Altered sleeping   0 3  0 2  Tired, decreased energy   0 3  0 2  Change in appetite   0 1  0 0  Feeling bad or failure about yourself    0 0  0 0  Trouble concentrating   0 0  0 2  Moving slowly or fidgety/restless   0 0  0 0  Suicidal thoughts   0 0  0 0  PHQ-9 Score   0 10  0 6  Difficult doing work/chores      Not difficult at all Somewhat difficult      Review of Systems  Musculoskeletal:        Bilateral lower leg pain  All other systems reviewed and are negative.     Objective:   Physical Exam Vitals and nursing note reviewed.  Constitutional:      Appearance: Normal appearance.  Cardiovascular:     Rate and Rhythm: Normal rate and regular rhythm.     Pulses: Normal pulses.     Heart sounds: Normal heart sounds.  Pulmonary:     Effort: Pulmonary effort is normal.     Breath sounds: Normal breath sounds.  Musculoskeletal:     Cervical back: Normal range of motion and neck supple.     Comments: Normal Muscle Bulk and Muscle Testing Reveals:  Upper Extremities: Full ROM and Muscle Strength  5/5  Lumbar Paraspinal Tenderness: L-4-L-5 Lower Extremities:  Decreased  ROM and Muscle Strength 5/5 Bilateral Lower Extremities Flexion Produces Pain into her Bilateral Patella's Arises from Table Slowly  using walker for support Narrow Based Gait     Skin:    General: Skin is warm and dry.  Neurological:     Mental Status: She is alert and oriented to person, place, and time.  Psychiatric:        Mood and Affect: Mood normal.        Behavior: Behavior normal.         Assessment & Plan:  Lumbar Radiculitis: S/P on 03/25/2023: Dr Alvester Morin : Lumbosacral Transforaminal Epidural Steroid Injection - Sub-Pedicular Approach with Fluoroscopic Guidance , with Good relief noted  2. Lumbar Facet Arthropathy: Continue HEP as tolerated. Continue current medication regimen. Continue to Monitor.  3. Chronic Pain Syndrome: Refilled: Hydrocodone 5/325 mg 0.5- 1 tablet twice a day as needed for pain #45. We will continue the opioid monitoring program, this consists of regular clinic visits, examinations, urine drug screen, pill counts as well as use of [place] Controlled Substance Reporting system. A 12 month History has been reviewed on the [place] Controlled Substance Reporting System on 05/27/2023,    F/U with 1 mon

## 2023-06-12 ENCOUNTER — Telehealth: Payer: Self-pay | Admitting: Physical Medicine and Rehabilitation

## 2023-06-12 NOTE — Telephone Encounter (Signed)
Patient called to see if you can schedule her an appt for the shot in her back. She only available on Mondays. CB#[phone]

## 2023-06-19 NOTE — Telephone Encounter (Signed)
Spoke with patient and informed her of the information below. She stated she will call back in September

## 2023-06-24 ENCOUNTER — Encounter: Payer: Medicare Other | Attending: Physical Medicine and Rehabilitation | Admitting: Registered Nurse

## 2023-06-24 ENCOUNTER — Other Ambulatory Visit: Payer: Self-pay | Admitting: Physical Medicine and Rehabilitation

## 2023-06-24 ENCOUNTER — Telehealth: Payer: Self-pay

## 2023-06-24 ENCOUNTER — Encounter: Payer: Self-pay | Admitting: Registered Nurse

## 2023-06-24 VITALS — BP 118/77 | HR 83 | Ht 62.0 in | Wt 168.0 lb

## 2023-06-24 DIAGNOSIS — M25561 Pain in right knee: Secondary | ICD-10-CM | POA: Insufficient documentation

## 2023-06-24 DIAGNOSIS — G894 Chronic pain syndrome: Secondary | ICD-10-CM | POA: Diagnosis not present

## 2023-06-24 DIAGNOSIS — M47816 Spondylosis without myelopathy or radiculopathy, lumbar region: Secondary | ICD-10-CM | POA: Insufficient documentation

## 2023-06-24 DIAGNOSIS — G8929 Other chronic pain: Secondary | ICD-10-CM | POA: Insufficient documentation

## 2023-06-24 DIAGNOSIS — Z79899 Other long term (current) drug therapy: Secondary | ICD-10-CM | POA: Insufficient documentation

## 2023-06-24 DIAGNOSIS — Z5181 Encounter for therapeutic drug level monitoring: Secondary | ICD-10-CM | POA: Insufficient documentation

## 2023-06-24 DIAGNOSIS — M25562 Pain in left knee: Secondary | ICD-10-CM | POA: Diagnosis not present

## 2023-06-24 DIAGNOSIS — M5416 Radiculopathy, lumbar region: Secondary | ICD-10-CM

## 2023-06-24 MED ORDER — HYDROCODONE-ACETAMINOPHEN 5-325 MG PO TABS: 0.5000 | ORAL_TABLET | Freq: Two times a day (BID) | ORAL | 0 refills | Status: DC | PRN

## 2023-06-24 NOTE — Telephone Encounter (Signed)
Patient called in to see if it is time for her to get another injection. Please advise

## 2023-06-24 NOTE — Progress Notes (Signed)
Subjective:    Patient ID: Tracey Armstrong, female    DOB: [birth year], 88 y.o.   MRN: [id number]  HPI: Tracey Armstrong is a 88 y.o. female who returns for follow up appointment for chronic pain and medication refill. She states her pain is located in her lower back, left hip and bilateral knee pain L>R.She rates her pain 8. Her current exercise regime is walking with walker.    Tracey Armstrong Morphine equivalent is 10.00 MME.She  is also prescribed Alprazolam  by Dr. Swaziland .We have discussed the black box warning of using opioids and benzodiazepines. I highlighted the dangers of using these drugs together and discussed the adverse events including respiratory suppression, overdose, cognitive impairment and importance of compliance with current regimen. We will continue to monitor and adjust as indicated.      Oral Swab was Performed Today.     Pain Inventory Average Pain 5 Pain Right Now 8 My pain is constant, dull, and aching  In the last 24 hours, has pain interfered with the following? General activity 7 Relation with others 5 Enjoyment of life 8 What TIME of day is your pain at its worst? morning , daytime, evening, night, and varies Sleep (in general) Good  Pain is worse with: walking, bending, standing, and some activites Pain improves with: medication Relief from Meds: 8  Family History  Problem Relation Age of Onset   Heart Problems Mother    Migraines Father    Stroke Father    Epilepsy Brother    Cancer Brother 64       unknown type - metastatic   Social History   Socioeconomic History   Marital status: Widowed    Spouse name: Not on file   Number of children: 4   Years of education: 27   Highest education level: Not on file  Occupational History   Occupation: Retired  Tobacco Use   Smoking status: Never   Smokeless tobacco: Never  Vaping Use   Vaping status: Never Used  Substance and Sexual Activity   Alcohol use: No   Drug use: No   Sexual activity: Not  Currently  Other Topics Concern   Not on file  Social History Narrative   Lives in [facility] [place] - [facility]      Patient drinks 1 glass of tea daily.      Patient is right handed.   Social Determinants of Health   Financial Resource Strain: Low Risk  (11/19/2022)   Overall Financial Resource Strain (CARDIA)    Difficulty of Paying Living Expenses: Not hard at all  Food Insecurity: No Food Insecurity (11/19/2022)   Hunger Vital Sign    Worried About Running Out of Food in the Last Year: Never true    Ran Out of Food in the Last Year: Never true  Transportation Needs: No Transportation Needs (11/19/2022)   PRAPARE - Administrator, Civil Service (Medical): No    Lack of Transportation (Non-Medical): No  Physical Activity: Insufficiently Active (11/19/2022)   Exercise Vital Sign    Days of Exercise per Week: 2 days    Minutes of Exercise per Session: 60 min  Stress: No Stress Concern Present (11/19/2022)   [employer] of Occupational Health - Occupational Stress Questionnaire    Feeling of Stress : Not at all  Social Connections: Moderately Integrated (11/19/2022)   Social Connection and Isolation Panel [NHANES]    Frequency of Communication with Friends and Family: More than three times a  week    Frequency of Social Gatherings with Friends and Family: More than three times a week    Attends Religious Services: More than 4 times per year    Active Member of Clubs or Organizations: Yes    Attends Banker Meetings: More than 4 times per year    Marital Status: Widowed   Past Surgical History:  Procedure Laterality Date   FEMUR IM NAIL Left 06/02/2020   Procedure: INTRAMEDULLARY (IM) RETROGRADE FEMORAL NAILING - LEFT;  Surgeon: Samson Frederic, MD;  Location: [facility] OR;  Service: Orthopedics;  Laterality: Left;   STOMACH SURGERY     Past Surgical History:  Procedure Laterality Date   FEMUR IM NAIL Left 06/02/2020   Procedure: INTRAMEDULLARY (IM) RETROGRADE  FEMORAL NAILING - LEFT;  Surgeon: Samson Frederic, MD;  Location: [facility] OR;  Service: Orthopedics;  Laterality: Left;   STOMACH SURGERY     Past Medical History:  Diagnosis Date   Anxiety    Arthritis    Chronic insomnia 03/30/2015   Chronic low back pain 12/02/2016   Depression    GERD (gastroesophageal reflux disease)    Hiatal hernia    High cholesterol    Hypertelorism    Hypertension    Meningioma (HCC)    Migraine    BP 118/77   Pulse 83   Ht 5\' 2"  (1.575 m)   Wt 168 lb (76.2 kg)   SpO2 98%   BMI 30.73 kg/m   Opioid Risk Score:   Fall Risk Score:  `1  Depression screen [facility] 2/9     03/27/2023   10:59 AM 02/23/2023   11:07 AM 11/19/2022    2:37 PM 11/05/2022   10:07 AM 10/01/2022    2:54 PM 07/30/2022   11:19 AM 05/14/2022   11:20 AM  Depression screen PHQ 2/9  Decreased Interest 0 1 0 3 0 0 0  Down, Depressed, Hopeless 0 1 0 0 0 0 0  PHQ - 2 Score 0 2 0 3 0 0 0  Altered sleeping   0 3  0 2  Tired, decreased energy   0 3  0 2  Change in appetite   0 1  0 0  Feeling bad or failure about yourself    0 0  0 0  Trouble concentrating   0 0  0 2  Moving slowly or fidgety/restless   0 0  0 0  Suicidal thoughts   0 0  0 0  PHQ-9 Score   0 10  0 6  Difficult doing work/chores      Not difficult at all Somewhat difficult     Review of Systems  Musculoskeletal:  Positive for back pain.       LT hip leg arm pain  All other systems reviewed and are negative.      Objective:   Physical Exam Vitals and nursing note reviewed.  Constitutional:      Appearance: Normal appearance.  Cardiovascular:     Rate and Rhythm: Normal rate and regular rhythm.     Pulses: Normal pulses.     Heart sounds: Normal heart sounds.  Pulmonary:     Effort: Pulmonary effort is normal.     Breath sounds: Normal breath sounds.  Musculoskeletal:     Cervical back: Normal range of motion and neck supple.     Comments: Normal Muscle Bulk and Muscle Testing Reveals:  Upper Extremities: Full  ROM and Muscle Strength 5/5  Lumbar Paraspinal  Tenderness: L-4-L-5 Lower Extremities: Full ROM and Muscle Strength 5/5 Arises from chair slowly using walker for support Narrow Based  Gait     Skin:    General: Skin is warm and dry.  Neurological:     Mental Status: She is alert and oriented to person, place, and time.  Psychiatric:        Mood and Affect: Mood normal.        Behavior: Behavior normal.         Assessment & Plan:  Lumbar Radiculitis: S/P on 03/25/2023: Dr Alvester Morin : Lumbosacral Transforaminal Epidural Steroid Injection - Sub-Pedicular Approach with Fluoroscopic Guidance , with Good relief noted. 06/24/2023 2. Lumbar Facet Arthropathy: Continue HEP as tolerated. Continue current medication regimen. Continue to Monitor. 06/24/2023 3. Chronic Pain Syndrome: Refilled: Hydrocodone 5 mg 0.5-1 tablet twice a day as needed for pain #45. We will continue the opioid monitoring program, this consists of regular clinic visits, examinations, urine drug screen, pill counts as well as use of [place] Controlled Substance Reporting system. A 12 month History has been reviewed on the [place] Controlled Substance Reporting System on  Continue current medication regimen.    F/U with 1 month

## 2023-06-27 LAB — DRUG TOX MONITOR 1 W/CONF, ORAL FLD

## 2023-06-27 LAB — DRUG TOX ALC METAB W/CON, ORAL FLD: Alcohol Metabolite: NEGATIVE ng/mL (ref <25)

## 2023-06-29 ENCOUNTER — Ambulatory Visit (INDEPENDENT_AMBULATORY_CARE_PROVIDER_SITE_OTHER): Payer: Medicare Other | Admitting: Physician Assistant

## 2023-06-29 ENCOUNTER — Encounter: Payer: Self-pay | Admitting: Physician Assistant

## 2023-06-29 ENCOUNTER — Telehealth: Payer: Self-pay | Admitting: Physical Medicine and Rehabilitation

## 2023-06-29 DIAGNOSIS — M7052 Other bursitis of knee, left knee: Secondary | ICD-10-CM

## 2023-06-29 DIAGNOSIS — M7051 Other bursitis of knee, right knee: Secondary | ICD-10-CM | POA: Diagnosis not present

## 2023-06-29 MED ORDER — METHYLPREDNISOLONE ACETATE 40 MG/ML IJ SUSP
40.0000 mg | INTRAMUSCULAR | Status: AC | PRN
Start: 2023-06-29 — End: 2023-06-29
  Administered 2023-06-29: 40 mg via INTRAMUSCULAR

## 2023-06-29 MED ORDER — LIDOCAINE HCL 1 % IJ SOLN
2.0000 mL | INTRAMUSCULAR | Status: AC | PRN
Start: 2023-06-29 — End: 2023-06-29
  Administered 2023-06-29: 2 mL

## 2023-06-29 NOTE — Telephone Encounter (Signed)
Patient in clinic and rescheduled for 07/06/23

## 2023-06-29 NOTE — Telephone Encounter (Signed)
Patient came in and and if you could change her appt. [phone]

## 2023-06-29 NOTE — Progress Notes (Signed)
   Procedure Note  Patient: Tracey Armstrong             Date of Birth: 1935-09-08           MRN: [id number]             Visit Date: 06/29/2023 HPI Mrs. Iles comes in today wanting injections both knees.  She states she has had no injury to either knee.  States that the cortisone injection for trochanteric bursitis really helped.  She has known osteoarthritis of bilateral knees.  Denies any fevers chills.  Review of systems: See HPI otherwise negative  Physical exam: General Well-developed well-nourished female who walks with a rollator. Bilateral knees: Good range of motion both knees.  Tenderness left knee medial joint line right knee lateral joint line.  Maximal tenderness over the pes anserinus region bilaterally.    Procedures: Visit Diagnoses:  1. Pes anserinus bursitis of left knee   2. Pes anserinus bursitis of right knee     Trigger Point Inj  Date/Time: 06/29/2023 1:25 PM  Performed by: Kirtland Bouchard, PA-C Authorized by: Kirtland Bouchard, PA-C   Consent Given by:  Patient Site marked: the procedure site was marked   Timeout: prior to procedure the correct patient, procedure, and site was verified   Indications:  Pain and therapeutic Total # of Trigger Points:  2 Location: lower extremity   Approach:  Medial Medications #1:  2 mL lidocaine 1 %; 40 mg methylPREDNISolone acetate 40 MG/ML Patient tolerance:  Patient tolerated the procedure well with no immediate complications  Plan: Follow-up as needed.  Questions encouraged and answered at length.

## 2023-07-01 ENCOUNTER — Encounter: Payer: Medicare Other | Admitting: Physical Medicine and Rehabilitation

## 2023-07-06 ENCOUNTER — Ambulatory Visit (INDEPENDENT_AMBULATORY_CARE_PROVIDER_SITE_OTHER): Payer: Medicare Other | Admitting: Physical Medicine and Rehabilitation

## 2023-07-06 ENCOUNTER — Other Ambulatory Visit: Payer: Self-pay

## 2023-07-06 DIAGNOSIS — M5416 Radiculopathy, lumbar region: Secondary | ICD-10-CM

## 2023-07-06 DIAGNOSIS — M48061 Spinal stenosis, lumbar region without neurogenic claudication: Secondary | ICD-10-CM

## 2023-07-06 MED ORDER — METHYLPREDNISOLONE ACETATE 80 MG/ML IJ SUSP
80.0000 mg | Freq: Once | INTRAMUSCULAR | Status: AC
  Administered 2023-07-06: 80 mg

## 2023-07-06 NOTE — Progress Notes (Unsigned)
Functional Pain Scale - descriptive words and definitions  {CHL AMB ORTHO FUNCTIONAL PAIN SCALE:28673}  Average Pain 3  Had a Trochanter shot that helped with most of the pain. However the back pain has  increased in the past couple of days. +Driver, -BT, -Dye Allergies.

## 2023-07-06 NOTE — Patient Instructions (Signed)
[facility] Physiatry Discharge Instructions  *At any time if you have questions or concerns they can be answered by calling [phone]  All Patients: You may experience an increase in your symptoms for the first 2 days (it can take 2 days to 2 weeks for the steroid/cortisone to have its maximal effect). You may use ice to the site for the first 24 hours; 20 minutes on and 20 minutes off and may use heat after that time. You may resume and continue your current pain medications. If you need a refill please contact the prescribing physician. You may resume your medications if any were stopped for the procedure. You may shower but no swimming, tub bath or Jacuzzi for 24 hours. Please remove bandage after 4 hours. You may resume light activities as tolerated. If you had Spine Injection, you should not drive for the next 3 hours due to anesthetics used in the procedure. Please have someone drive for you.  *If you have had sedation, Valium, Xanax, or lorazepam: Do not drive or use public transportation for 24 hours, do not operating hazardous machinery or make important personal/business decisions for 24 hours.  POSSIBLE STEROID SIDE EFFECTS: If experienced these should only last for a short period. Change in menstrual flow  Edema in (swelling)  Increased appetite Skin flushing (redness)  Skin rash/acne  Thrush (oral) Vaginitis    Increased sweating  Depression Increased blood glucose levels Cramping and leg/calf  Euphoria (feeling happy)  POSSIBLE PROCEDURE SIDE EFFECTS: Please call our office if concerned. Increased pain Increased numbness/tingling  Headache Nausea/vomiting Hematoma (bruising/bleeding) Edema (swelling at the site) Weakness  Infection (red/drainage at site) Fever greater than 100.94F  *In the event of a headache after epidural steroid injection: Drink plenty of fluids, especially water and try to lay flat when possible. If the headache does not get better after a few days  or as always if concerned please call the office.

## 2023-07-08 NOTE — Procedures (Signed)
Lumbosacral Transforaminal Epidural Steroid Injection - Sub-Pedicular Approach with Fluoroscopic Guidance  Patient: Tracey Armstrong      Date of Birth: 1935-02-20 MRN: [id number] PCP: Swaziland, [name], MD      Visit Date: 07/06/2023   Universal Protocol:    Date/Time: 07/06/2023  Consent Given By: the patient  Position: PRONE  Additional Comments: Vital signs were monitored before and after the procedure. Patient was prepped and draped in the usual sterile fashion. The correct patient, procedure, and site was verified.   Injection Procedure Details:   Procedure diagnoses: Lumbar radiculopathy [M54.16]    Meds Administered:  Meds ordered this encounter  Medications   methylPREDNISolone acetate (DEPO-MEDROL) injection 80 mg    Laterality: Bilateral  Location/Site: L4  Needle:5.0 in., 22 ga.  Short bevel or Quincke spinal needle  Needle Placement: Transforaminal  Findings:    -Comments: Excellent flow of contrast along the nerve, nerve root and into the epidural space.  Procedure Details: After squaring off the end-plates to get a true AP view, the C-arm was positioned so that an oblique view of the foramen as noted above was visualized. The target area is just inferior to the "nose of the scotty dog" or sub pedicular. The soft tissues overlying this structure were infiltrated with 2-3 ml. of 1% Lidocaine without Epinephrine.  The spinal needle was inserted toward the target using a "trajectory" view along the fluoroscope beam.  Under AP and lateral visualization, the needle was advanced so it did not puncture dura and was located close the 6 O'Clock position of the pedical in AP tracterory. Biplanar projections were used to confirm position. Aspiration was confirmed to be negative for CSF and/or blood. A 1-2 ml. volume of Isovue-250 was injected and flow of contrast was noted at each level. Radiographs were obtained for documentation purposes.   After attaining the desired  flow of contrast documented above, a 0.5 to 1.0 ml test dose of 0.25% Marcaine was injected into each respective transforaminal space.  The patient was observed for 90 seconds post injection.  After no sensory deficits were reported, and normal lower extremity motor function was noted,   the above injectate was administered so that equal amounts of the injectate were placed at each foramen (level) into the transforaminal epidural space.   Additional Comments:  No complications occurred Dressing: 2 x 2 sterile gauze and Band-Aid    Post-procedure details: Patient was observed during the procedure. Post-procedure instructions were reviewed.  Patient left the clinic in stable condition.

## 2023-07-08 NOTE — Progress Notes (Signed)
Tracey Armstrong - 88 y.o. female MRN [id number]  Date of birth: 12-Jul-1935  Office Visit Note: Visit Date: 07/06/2023 PCP: [place], [name], MD Referred by: [place], [name], MD  Subjective: Chief Complaint  Patient presents with   Lower Back - Pain   HPI:  Tracey Armstrong is a 88 y.o. female who comes in today for planned repeat Bilateral L4-5  Lumbar Transforaminal epidural steroid injection with fluoroscopic guidance.  The patient has failed conservative care including home exercise, medications, time and activity modification.  This injection will be diagnostic and hopefully therapeutic.  Please see requesting physician notes for further details and justification. Patient received more than 50% pain relief from prior injection.  Patient follows with Dr. Magnus Ivan from an orthopedic standpoint for her knees etc.  It appears that Rexene Edison, P.A.-C repeated greater trochanteric injection that she feels was helpful.  I feel like probably her symptoms are related more to her lumbar stenosis and bursitis but I am glad she got some help with that.  She is having back pain down both sides pretty severe.  Continues to use walker.  She is in pain management with Jacalyn Lefevre at physical medicine rehabilitation.  Referring: Ellin Goodie, FNP   ROS Otherwise per HPI.  Assessment & Plan: Visit Diagnoses:    ICD-10-CM   1. Lumbar radiculopathy  M54.16 XR C-ARM NO REPORT    Epidural Steroid injection    methylPREDNISolone acetate (DEPO-MEDROL) injection 80 mg    2. Spinal stenosis of lumbar region without neurogenic claudication  M48.061 XR C-ARM NO REPORT    Epidural Steroid injection    methylPREDNISolone acetate (DEPO-MEDROL) injection 80 mg      Plan: No additional findings.   Meds & Orders:  Meds ordered this encounter  Medications   methylPREDNISolone acetate (DEPO-MEDROL) injection 80 mg    Orders Placed This Encounter  Procedures   XR C-ARM NO REPORT   Epidural Steroid injection     Follow-up: Return if symptoms worsen or fail to improve.   Procedures: No procedures performed  Lumbosacral Transforaminal Epidural Steroid Injection - Sub-Pedicular Approach with Fluoroscopic Guidance  Patient: Tracey Armstrong      Date of Birth: 1934-11-14 MRN: [id number] PCP: [place], [name], MD      Visit Date: 07/06/2023   Universal Protocol:    Date/Time: 07/06/2023  Consent Given By: the patient  Position: PRONE  Additional Comments: Vital signs were monitored before and after the procedure. Patient was prepped and draped in the usual sterile fashion. The correct patient, procedure, and site was verified.   Injection Procedure Details:   Procedure diagnoses: Lumbar radiculopathy [M54.16]    Meds Administered:  Meds ordered this encounter  Medications   methylPREDNISolone acetate (DEPO-MEDROL) injection 80 mg    Laterality: Bilateral  Location/Site: L4  Needle:5.0 in., 22 ga.  Short bevel or Quincke spinal needle  Needle Placement: Transforaminal  Findings:    -Comments: Excellent flow of contrast along the nerve, nerve root and into the epidural space.  Procedure Details: After squaring off the end-plates to get a true AP view, the C-arm was positioned so that an oblique view of the foramen as noted above was visualized. The target area is just inferior to the "nose of the scotty dog" or sub pedicular. The soft tissues overlying this structure were infiltrated with 2-3 ml. of 1% Lidocaine without Epinephrine.  The spinal needle was inserted toward the target using a "trajectory" view along the fluoroscope beam.  Under AP  and lateral visualization, the needle was advanced so it did not puncture dura and was located close the 6 O'Clock position of the pedical in AP tracterory. Biplanar projections were used to confirm position. Aspiration was confirmed to be negative for CSF and/or blood. A 1-2 ml. volume of Isovue-250 was injected and flow of contrast was noted at  each level. Radiographs were obtained for documentation purposes.   After attaining the desired flow of contrast documented above, a 0.5 to 1.0 ml test dose of 0.25% Marcaine was injected into each respective transforaminal space.  The patient was observed for 90 seconds post injection.  After no sensory deficits were reported, and normal lower extremity motor function was noted,   the above injectate was administered so that equal amounts of the injectate were placed at each foramen (level) into the transforaminal epidural space.   Additional Comments:  No complications occurred Dressing: 2 x 2 sterile gauze and Band-Aid    Post-procedure details: Patient was observed during the procedure. Post-procedure instructions were reviewed.  Patient left the clinic in stable condition.    Clinical History: CT LUMBAR SPINE WITHOUT CONTRAST   TECHNIQUE:  Multidetector CT imaging of the lumbar spine was performed without  intravenous contrast administration. Multiplanar CT image  reconstructions were also generated.   COMPARISON:  Lumbar spine radiographs 04/04/2020 and MRI 09/09/2018   FINDINGS:  Segmentation: 5 lumbar type vertebrae.   Alignment: Mild thoracolumbar levoscoliosis. Grade 1 anterolisthesis  of L4 on L5, unchanged from the prior MRI.   Vertebrae: L1 superior endplate compression fracture with 30%  vertebral body height loss and 4 mm retropulsion of the superior  endplate. A fracture line remains visible although there is  surrounding sclerosis. Hemangioma in the L4 vertebral body.   Paraspinal and other soft tissues: Aortic atherosclerosis without  aneurysm.   Disc levels: Disc bulging and posterior element hypertrophy result  in chronic mild-to-moderate spinal stenosis at L3-4 and moderate to  severe spinal stenosis at L4-5. No significant spinal stenosis  related to mild L1 retropulsion.   IMPRESSION:  1. Possibly subacute L1 compression fracture with 30% height  loss  and 4 mm retropulsion. No associated spinal stenosis.  2. Chronic mild-to-moderate spinal stenosis at L3-4 and moderate to  severe spinal stenosis at L4-5.  3. Aortic Atherosclerosis (ICD10-I70.0).    Electronically Signed    By: Sebastian Ache M.D.    On: 04/04/2020 17:39     Objective:  VS:  HT:    WT:   BMI:     BP:   HR: bpm  TEMP: ( )  RESP:  Physical Exam Vitals and nursing note reviewed.  Constitutional:      General: She is not in acute distress.    Appearance: Normal appearance. She is not ill-appearing.  HENT:     Head: Normocephalic and atraumatic.     Right Ear: External ear normal.     Left Ear: External ear normal.  Eyes:     Extraocular Movements: Extraocular movements intact.  Cardiovascular:     Rate and Rhythm: Normal rate.     Pulses: Normal pulses.  Pulmonary:     Effort: Pulmonary effort is normal. No respiratory distress.  Abdominal:     General: There is no distension.     Palpations: Abdomen is soft.  Musculoskeletal:        General: Tenderness present.     Cervical back: Neck supple.     Right lower leg: No edema.  Left lower leg: No edema.     Comments: Patient has good distal strength with no pain over the greater trochanters.  No clonus or focal weakness.  Skin:    Findings: No erythema, lesion or rash.  Neurological:     General: No focal deficit present.     Mental Status: She is alert and oriented to person, place, and time.     Sensory: No sensory deficit.     Motor: No weakness or abnormal muscle tone.     Coordination: Coordination normal.  Psychiatric:        Mood and Affect: Mood normal.        Behavior: Behavior normal.      Imaging: No results found.

## 2023-07-25 ENCOUNTER — Other Ambulatory Visit: Payer: Self-pay | Admitting: Family Medicine

## 2023-07-25 DIAGNOSIS — E559 Vitamin D deficiency, unspecified: Secondary | ICD-10-CM

## 2023-07-25 DIAGNOSIS — N393 Stress incontinence (female) (male): Secondary | ICD-10-CM

## 2023-07-29 DIAGNOSIS — H52201 Unspecified astigmatism, right eye: Secondary | ICD-10-CM | POA: Diagnosis not present

## 2023-07-29 DIAGNOSIS — H35371 Puckering of macula, right eye: Secondary | ICD-10-CM | POA: Diagnosis not present

## 2023-08-03 ENCOUNTER — Telehealth: Payer: Self-pay | Admitting: Family Medicine

## 2023-08-03 NOTE — Telephone Encounter (Signed)
Faxed over referral request on 07/29/23, checking on progress. Can be faxed back to [phone]

## 2023-08-03 NOTE — Telephone Encounter (Signed)
This has not been received, left a voicemail for them to re-fax it.

## 2023-08-07 DIAGNOSIS — M5459 Other low back pain: Secondary | ICD-10-CM | POA: Diagnosis not present

## 2023-08-07 DIAGNOSIS — R2689 Other abnormalities of gait and mobility: Secondary | ICD-10-CM | POA: Diagnosis not present

## 2023-08-07 DIAGNOSIS — R2681 Unsteadiness on feet: Secondary | ICD-10-CM | POA: Diagnosis not present

## 2023-08-10 DIAGNOSIS — M5459 Other low back pain: Secondary | ICD-10-CM | POA: Diagnosis not present

## 2023-08-10 DIAGNOSIS — R2689 Other abnormalities of gait and mobility: Secondary | ICD-10-CM | POA: Diagnosis not present

## 2023-08-10 DIAGNOSIS — R2681 Unsteadiness on feet: Secondary | ICD-10-CM | POA: Diagnosis not present

## 2023-08-11 DIAGNOSIS — M5459 Other low back pain: Secondary | ICD-10-CM | POA: Diagnosis not present

## 2023-08-11 DIAGNOSIS — R2689 Other abnormalities of gait and mobility: Secondary | ICD-10-CM | POA: Diagnosis not present

## 2023-08-11 DIAGNOSIS — R2681 Unsteadiness on feet: Secondary | ICD-10-CM | POA: Diagnosis not present

## 2023-08-13 DIAGNOSIS — R2681 Unsteadiness on feet: Secondary | ICD-10-CM | POA: Diagnosis not present

## 2023-08-13 DIAGNOSIS — R2689 Other abnormalities of gait and mobility: Secondary | ICD-10-CM | POA: Diagnosis not present

## 2023-08-13 DIAGNOSIS — M5459 Other low back pain: Secondary | ICD-10-CM | POA: Diagnosis not present

## 2023-08-24 DIAGNOSIS — M5459 Other low back pain: Secondary | ICD-10-CM | POA: Diagnosis not present

## 2023-08-24 DIAGNOSIS — R2681 Unsteadiness on feet: Secondary | ICD-10-CM | POA: Diagnosis not present

## 2023-08-24 DIAGNOSIS — R2689 Other abnormalities of gait and mobility: Secondary | ICD-10-CM | POA: Diagnosis not present

## 2023-08-25 DIAGNOSIS — M5459 Other low back pain: Secondary | ICD-10-CM | POA: Diagnosis not present

## 2023-08-25 DIAGNOSIS — R2681 Unsteadiness on feet: Secondary | ICD-10-CM | POA: Diagnosis not present

## 2023-08-25 DIAGNOSIS — R2689 Other abnormalities of gait and mobility: Secondary | ICD-10-CM | POA: Diagnosis not present

## 2023-08-26 DIAGNOSIS — M5459 Other low back pain: Secondary | ICD-10-CM | POA: Diagnosis not present

## 2023-08-26 DIAGNOSIS — R2681 Unsteadiness on feet: Secondary | ICD-10-CM | POA: Diagnosis not present

## 2023-08-26 DIAGNOSIS — R2689 Other abnormalities of gait and mobility: Secondary | ICD-10-CM | POA: Diagnosis not present

## 2023-08-31 DIAGNOSIS — M5459 Other low back pain: Secondary | ICD-10-CM | POA: Diagnosis not present

## 2023-08-31 DIAGNOSIS — R2681 Unsteadiness on feet: Secondary | ICD-10-CM | POA: Diagnosis not present

## 2023-08-31 DIAGNOSIS — R2689 Other abnormalities of gait and mobility: Secondary | ICD-10-CM | POA: Diagnosis not present

## 2023-09-02 DIAGNOSIS — M5459 Other low back pain: Secondary | ICD-10-CM | POA: Diagnosis not present

## 2023-09-02 DIAGNOSIS — R2689 Other abnormalities of gait and mobility: Secondary | ICD-10-CM | POA: Diagnosis not present

## 2023-09-02 DIAGNOSIS — R2681 Unsteadiness on feet: Secondary | ICD-10-CM | POA: Diagnosis not present

## 2023-09-03 DIAGNOSIS — R2689 Other abnormalities of gait and mobility: Secondary | ICD-10-CM | POA: Diagnosis not present

## 2023-09-03 DIAGNOSIS — M5459 Other low back pain: Secondary | ICD-10-CM | POA: Diagnosis not present

## 2023-09-03 DIAGNOSIS — R2681 Unsteadiness on feet: Secondary | ICD-10-CM | POA: Diagnosis not present

## 2023-09-04 DIAGNOSIS — R2689 Other abnormalities of gait and mobility: Secondary | ICD-10-CM | POA: Diagnosis not present

## 2023-09-04 DIAGNOSIS — M5459 Other low back pain: Secondary | ICD-10-CM | POA: Diagnosis not present

## 2023-09-04 DIAGNOSIS — R2681 Unsteadiness on feet: Secondary | ICD-10-CM | POA: Diagnosis not present

## 2023-09-07 ENCOUNTER — Encounter: Payer: Self-pay | Admitting: Neurology

## 2023-09-07 ENCOUNTER — Telehealth: Payer: Self-pay | Admitting: Physical Medicine and Rehabilitation

## 2023-09-07 DIAGNOSIS — M5459 Other low back pain: Secondary | ICD-10-CM | POA: Diagnosis not present

## 2023-09-07 DIAGNOSIS — R2689 Other abnormalities of gait and mobility: Secondary | ICD-10-CM | POA: Diagnosis not present

## 2023-09-07 DIAGNOSIS — R2681 Unsteadiness on feet: Secondary | ICD-10-CM | POA: Diagnosis not present

## 2023-09-07 NOTE — Telephone Encounter (Signed)
Patient called and wants to schedule for her back. CB#[phone]

## 2023-09-08 ENCOUNTER — Telehealth: Payer: Self-pay | Admitting: Physical Medicine and Rehabilitation

## 2023-09-08 DIAGNOSIS — R2689 Other abnormalities of gait and mobility: Secondary | ICD-10-CM | POA: Diagnosis not present

## 2023-09-08 DIAGNOSIS — M5459 Other low back pain: Secondary | ICD-10-CM | POA: Diagnosis not present

## 2023-09-08 DIAGNOSIS — R2681 Unsteadiness on feet: Secondary | ICD-10-CM | POA: Diagnosis not present

## 2023-09-08 NOTE — Telephone Encounter (Signed)
Pt called again to a call back to set an appt for back injection with [facility]. Please call pt at [phone].

## 2023-09-08 NOTE — Telephone Encounter (Signed)
Lvm for Tracey Armstrong to call back regarding back injection.  I need to know if previous injection helped if so how long ? Any falls or new injury?  Is it the same pain that she was having prior to injection ? If not where is the pain located?

## 2023-09-09 DIAGNOSIS — R2681 Unsteadiness on feet: Secondary | ICD-10-CM | POA: Diagnosis not present

## 2023-09-09 DIAGNOSIS — R2689 Other abnormalities of gait and mobility: Secondary | ICD-10-CM | POA: Diagnosis not present

## 2023-09-09 DIAGNOSIS — M5459 Other low back pain: Secondary | ICD-10-CM | POA: Diagnosis not present

## 2023-09-11 ENCOUNTER — Telehealth: Payer: Self-pay | Admitting: Physical Medicine and Rehabilitation

## 2023-09-11 DIAGNOSIS — R2681 Unsteadiness on feet: Secondary | ICD-10-CM | POA: Diagnosis not present

## 2023-09-11 DIAGNOSIS — R2689 Other abnormalities of gait and mobility: Secondary | ICD-10-CM | POA: Diagnosis not present

## 2023-09-11 DIAGNOSIS — M5459 Other low back pain: Secondary | ICD-10-CM | POA: Diagnosis not present

## 2023-09-11 NOTE — Telephone Encounter (Signed)
Patient called and said she needs a steroid shot in her back. CB#[phone]

## 2023-09-14 ENCOUNTER — Other Ambulatory Visit: Payer: Self-pay | Admitting: Family Medicine

## 2023-09-14 DIAGNOSIS — F411 Generalized anxiety disorder: Secondary | ICD-10-CM

## 2023-09-14 DIAGNOSIS — R2689 Other abnormalities of gait and mobility: Secondary | ICD-10-CM | POA: Diagnosis not present

## 2023-09-14 DIAGNOSIS — M5459 Other low back pain: Secondary | ICD-10-CM | POA: Diagnosis not present

## 2023-09-14 DIAGNOSIS — R2681 Unsteadiness on feet: Secondary | ICD-10-CM | POA: Diagnosis not present

## 2023-09-16 DIAGNOSIS — M5459 Other low back pain: Secondary | ICD-10-CM | POA: Diagnosis not present

## 2023-09-16 DIAGNOSIS — R2681 Unsteadiness on feet: Secondary | ICD-10-CM | POA: Diagnosis not present

## 2023-09-16 DIAGNOSIS — R2689 Other abnormalities of gait and mobility: Secondary | ICD-10-CM | POA: Diagnosis not present

## 2023-09-21 ENCOUNTER — Encounter: Payer: Self-pay | Admitting: Physician Assistant

## 2023-09-21 ENCOUNTER — Ambulatory Visit: Payer: Medicare Other | Admitting: Physician Assistant

## 2023-09-21 DIAGNOSIS — M1712 Unilateral primary osteoarthritis, left knee: Secondary | ICD-10-CM

## 2023-09-21 DIAGNOSIS — M7062 Trochanteric bursitis, left hip: Secondary | ICD-10-CM | POA: Diagnosis not present

## 2023-09-21 MED ORDER — LIDOCAINE HCL 1 % IJ SOLN
3.0000 mL | INTRAMUSCULAR | Status: AC | PRN
Start: 2023-09-21 — End: 2023-09-21
  Administered 2023-09-21: 3 mL

## 2023-09-21 MED ORDER — METHYLPREDNISOLONE ACETATE 40 MG/ML IJ SUSP
40.0000 mg | INTRAMUSCULAR | Status: AC | PRN
Start: 2023-09-21 — End: 2023-09-21
  Administered 2023-09-21: 40 mg via INTRA_ARTICULAR

## 2023-09-21 NOTE — Progress Notes (Signed)
   Procedure Note  Patient: Tracey Armstrong             Date of Birth: 02-Jan-1935           MRN: [id number]             Visit Date: 09/21/2023  HPI: Mrs. Tracey Armstrong comes in today requesting injection for her left knee arthritis in her left hip trochanteric bursitis.  She states that the injections do help.  She denies any fevers chills.  She is also asking about getting a epidural steroid injection lumbar spine with Dr. Alvester Morin.  She has had no new injuries.  Review of systems: Negative for fevers chills  Physical exam: General Well-developed well-nourished female who ambulates with a rollator.  Left knee: No abnormal warmth erythema or effusion.  Tenderness medial and lateral joint line.  Good range of motion. Left hip: Good range of motion without pain.  Tenderness over the trochanteric region.   Procedures: Visit Diagnoses:  1. Trochanteric bursitis, left hip   2. Unilateral primary osteoarthritis, left knee     Large Joint Inj: L knee on 09/21/2023 2:05 PM Indications: pain Details: 22 G 1.5 in needle, anterolateral approach  Arthrogram: No  Medications: 3 mL lidocaine 1 %; 40 mg methylPREDNISolone acetate 40 MG/ML Outcome: tolerated well, no immediate complications Procedure, treatment alternatives, risks and benefits explained, specific risks discussed. Consent was given by the patient. Immediately prior to procedure a time out was called to verify the correct patient, procedure, equipment, support staff and site/side marked as required. Patient was prepped and draped in the usual sterile fashion.    Large Joint Inj: R greater trochanter on 09/21/2023 2:05 PM Indications: pain Details: 22 G 1.5 in needle, lateral approach  Arthrogram: No  Medications: 3 mL lidocaine 1 %; 40 mg methylPREDNISolone acetate 40 MG/ML Outcome: tolerated well, no immediate complications Procedure, treatment alternatives, risks and benefits explained, specific risks discussed. Consent was given by the  patient. Immediately prior to procedure a time out was called to verify the correct patient, procedure, equipment, support staff and site/side marked as required. Patient was prepped and draped in the usual sterile fashion.     Plan: She knows to wait at least 3 months between cortisone injections in the left hip and left knee.  Questions were encouraged and answered at length.

## 2023-09-22 NOTE — Progress Notes (Signed)
Subjective:    Patient ID: Tracey Armstrong, female    DOB: 1935-03-05, 88 y.o.   MRN: [id number]  HPI: Tracey Armstrong is a 88 y.o. female who returns for follow up appointment for chronic pain and medication refill. She states her pain is located in her lower back radiating into her left lower extremity , left hip and bilateral knees L>R. She rates her pain 10. Her current exercise regime is walking with her Rolator and attending physical therapy three days a week at [facility].   [name] Morphine equivalent is 10.23 MME.   Last Oral Swab was Performed on 09/.01/2023     Pain Inventory Average Pain 10 Pain Right Now 10 My pain is aching  In the last 24 hours, has pain interfered with the following? General activity 10 Relation with others 10 Enjoyment of life 10 What TIME of day is your pain at its worst? varies Sleep (in general) Good  Pain is worse with: sitting and standing Pain improves with: medication and laying down flat on bed Relief from Meds: 5  Family History  Problem Relation Age of Onset   Heart Problems Mother    Migraines Father    Stroke Father    Epilepsy Brother    Cancer Brother 4       unknown type - metastatic   Social History   Socioeconomic History   Marital status: Widowed    Spouse name: Not on file   Number of children: 4   Years of education: 52   Highest education level: Not on file  Occupational History   Occupation: Retired  Tobacco Use   Smoking status: Never   Smokeless tobacco: Never  Vaping Use   Vaping status: Never Used  Substance and Sexual Activity   Alcohol use: No   Drug use: No   Sexual activity: Not Currently  Other Topics Concern   Not on file  Social History Narrative   Lives in [facility]      Patient drinks 1 glass of tea daily.      Patient is right handed.   Social Determinants of Health   Financial Resource Strain: Low Risk  (11/19/2022)   Overall Financial Resource Strain (CARDIA)     Difficulty of Paying Living Expenses: Not hard at all  Food Insecurity: No Food Insecurity (11/19/2022)   Hunger Vital Sign    Worried About Running Out of Food in the Last Year: Never true    Ran Out of Food in the Last Year: Never true  Transportation Needs: No Transportation Needs (11/19/2022)   PRAPARE - Administrator, Civil Service (Medical): No    Lack of Transportation (Non-Medical): No  Physical Activity: Insufficiently Active (11/19/2022)   Exercise Vital Sign    Days of Exercise per Week: 2 days    Minutes of Exercise per Session: 60 min  Stress: No Stress Concern Present (11/19/2022)   [employer] of Occupational Health - Occupational Stress Questionnaire    Feeling of Stress : Not at all  Social Connections: Moderately Integrated (11/19/2022)   Social Connection and Isolation Panel [NHANES]    Frequency of Communication with Friends and Family: More than three times a week    Frequency of Social Gatherings with Friends and Family: More than three times a week    Attends Religious Services: More than 4 times per year    Active Member of [employer] or Organizations: Yes    Attends Club or  Organization Meetings: More than 4 times per year    Marital Status: Widowed   Past Surgical History:  Procedure Laterality Date   FEMUR IM NAIL Left 06/02/2020   Procedure: INTRAMEDULLARY (IM) RETROGRADE FEMORAL NAILING - LEFT;  Surgeon: Samson Frederic, MD;  Location: [facility] OR;  Service: Orthopedics;  Laterality: Left;   STOMACH SURGERY     Past Surgical History:  Procedure Laterality Date   FEMUR IM NAIL Left 06/02/2020   Procedure: INTRAMEDULLARY (IM) RETROGRADE FEMORAL NAILING - LEFT;  Surgeon: Samson Frederic, MD;  Location: [facility] OR;  Service: Orthopedics;  Laterality: Left;   STOMACH SURGERY     Past Medical History:  Diagnosis Date   Anxiety    Arthritis    Chronic insomnia 03/30/2015   Chronic low back pain 12/02/2016   Depression    GERD (gastroesophageal reflux  disease)    Hiatal hernia    High cholesterol    Hypertelorism    Hypertension    Meningioma (HCC)    Migraine    There were no vitals taken for this visit.  Opioid Risk Score:   Fall Risk Score:  `1  Depression screen [facility] 2/9     06/24/2023   11:05 AM 03/27/2023   10:59 AM 02/23/2023   11:07 AM 11/19/2022    2:37 PM 11/05/2022   10:07 AM 10/01/2022    2:54 PM 07/30/2022   11:19 AM  Depression screen PHQ 2/9  Decreased Interest 0 0 1 0 3 0 0  Down, Depressed, Hopeless 0 0 1 0 0 0 0  PHQ - 2 Score 0 0 2 0 3 0 0  Altered sleeping    0 3  0  Tired, decreased energy    0 3  0  Change in appetite    0 1  0  Feeling bad or failure about yourself     0 0  0  Trouble concentrating    0 0  0  Moving slowly or fidgety/restless    0 0  0  Suicidal thoughts    0 0  0  PHQ-9 Score    0 10  0  Difficult doing work/chores       Not difficult at all    Review of Systems  Musculoskeletal:  Positive for back pain and gait problem.  All other systems reviewed and are negative.     Objective:   Physical Exam Vitals and nursing note reviewed.  Constitutional:      Appearance: Normal appearance.  Cardiovascular:     Rate and Rhythm: Normal rate and regular rhythm.     Pulses: Normal pulses.     Heart sounds: Normal heart sounds.  Pulmonary:     Effort: Pulmonary effort is normal.     Breath sounds: Normal breath sounds.  Musculoskeletal:     Comments: Normal Muscle Bulk and Muscle Testing Reveals:  Upper Extremities: Full ROM and Muscle Strength 5/5 Lumbar Paraspinal Tenderness: L-3-L-5 Left Greater Trochanter Tenderness Lower Extremities: Right Full ROM and Muscle Strength 5/5 Left Lower Extremity: Decreased ROM and Muscle trength 5/5 Arrived in wheelchair    Skin:    General: Skin is warm and dry.  Neurological:     Mental Status: She is alert and oriented to person, place, and time.  Psychiatric:        Mood and Affect: Mood normal.        Behavior: Behavior normal.          Assessment & Plan:  Lumbar Radiculitis: S/P on 03/25/2023: Dr Alvester Morin : Lumbosacral Transforaminal Epidural Steroid Injection - Sub-Pedicular Approach with Fluoroscopic Guidance , with Good relief noted. 09/22/2023 2. Lumbar Facet Arthropathy: Continue HEP as tolerated. Continue current medication regimen. Continue to Monitor. 09/22/2023 3. Chronic Pain Syndrome: Refilled: Hydrocodone 5 mg 0.5-1 tablet twice a day as needed for pain #45. We will continue the opioid monitoring program, this consists of regular clinic visits, examinations, urine drug screen, pill counts as well as use of [place] Controlled Substance Reporting system. A 12 month History has been reviewed on the [place] Controlled Substance Reporting System on 12/03 Continue current medication regimen.    F/U with 2 months

## 2023-09-23 ENCOUNTER — Encounter: Payer: Medicare Other | Attending: Physical Medicine and Rehabilitation | Admitting: Registered Nurse

## 2023-09-23 VITALS — BP 145/79 | HR 82 | Ht 62.0 in | Wt 168.0 lb

## 2023-09-23 DIAGNOSIS — M25562 Pain in left knee: Secondary | ICD-10-CM | POA: Diagnosis not present

## 2023-09-23 DIAGNOSIS — M25561 Pain in right knee: Secondary | ICD-10-CM | POA: Insufficient documentation

## 2023-09-23 DIAGNOSIS — M5459 Other low back pain: Secondary | ICD-10-CM | POA: Diagnosis not present

## 2023-09-23 DIAGNOSIS — G894 Chronic pain syndrome: Secondary | ICD-10-CM | POA: Diagnosis not present

## 2023-09-23 DIAGNOSIS — Z79899 Other long term (current) drug therapy: Secondary | ICD-10-CM | POA: Diagnosis not present

## 2023-09-23 DIAGNOSIS — Z5181 Encounter for therapeutic drug level monitoring: Secondary | ICD-10-CM | POA: Insufficient documentation

## 2023-09-23 DIAGNOSIS — G8929 Other chronic pain: Secondary | ICD-10-CM | POA: Insufficient documentation

## 2023-09-23 DIAGNOSIS — M47816 Spondylosis without myelopathy or radiculopathy, lumbar region: Secondary | ICD-10-CM | POA: Diagnosis not present

## 2023-09-23 DIAGNOSIS — M5416 Radiculopathy, lumbar region: Secondary | ICD-10-CM | POA: Diagnosis not present

## 2023-09-23 DIAGNOSIS — M7061 Trochanteric bursitis, right hip: Secondary | ICD-10-CM

## 2023-09-23 DIAGNOSIS — R2689 Other abnormalities of gait and mobility: Secondary | ICD-10-CM | POA: Diagnosis not present

## 2023-09-23 DIAGNOSIS — M7062 Trochanteric bursitis, left hip: Secondary | ICD-10-CM | POA: Diagnosis not present

## 2023-09-23 DIAGNOSIS — R2681 Unsteadiness on feet: Secondary | ICD-10-CM | POA: Diagnosis not present

## 2023-09-23 NOTE — Patient Instructions (Signed)
Please call [facility] about your medication, if they don't have the medication in stock, please call the office to let us a know. This way we can send your prescription to another pharmacy.

## 2023-09-25 ENCOUNTER — Other Ambulatory Visit: Payer: Self-pay

## 2023-09-28 ENCOUNTER — Other Ambulatory Visit: Payer: Self-pay | Admitting: Physical Medicine and Rehabilitation

## 2023-09-28 ENCOUNTER — Encounter: Payer: Self-pay | Admitting: Registered Nurse

## 2023-09-28 ENCOUNTER — Telehealth: Payer: Self-pay | Admitting: Physical Medicine and Rehabilitation

## 2023-09-28 DIAGNOSIS — M5416 Radiculopathy, lumbar region: Secondary | ICD-10-CM

## 2023-09-28 DIAGNOSIS — M48062 Spinal stenosis, lumbar region with neurogenic claudication: Secondary | ICD-10-CM

## 2023-09-28 NOTE — Telephone Encounter (Signed)
Pt called crying stating she need an appt as soon as possible. She states her back is killing her and need an appt  for a back injection. Please call pt at [phone].

## 2023-10-01 DIAGNOSIS — R2681 Unsteadiness on feet: Secondary | ICD-10-CM | POA: Diagnosis not present

## 2023-10-01 DIAGNOSIS — R2689 Other abnormalities of gait and mobility: Secondary | ICD-10-CM | POA: Diagnosis not present

## 2023-10-01 DIAGNOSIS — M5459 Other low back pain: Secondary | ICD-10-CM | POA: Diagnosis not present

## 2023-10-05 DIAGNOSIS — R2681 Unsteadiness on feet: Secondary | ICD-10-CM | POA: Diagnosis not present

## 2023-10-05 DIAGNOSIS — R2689 Other abnormalities of gait and mobility: Secondary | ICD-10-CM | POA: Diagnosis not present

## 2023-10-05 DIAGNOSIS — M5459 Other low back pain: Secondary | ICD-10-CM | POA: Diagnosis not present

## 2023-10-06 DIAGNOSIS — M5459 Other low back pain: Secondary | ICD-10-CM | POA: Diagnosis not present

## 2023-10-06 DIAGNOSIS — R2681 Unsteadiness on feet: Secondary | ICD-10-CM | POA: Diagnosis not present

## 2023-10-06 DIAGNOSIS — R2689 Other abnormalities of gait and mobility: Secondary | ICD-10-CM | POA: Diagnosis not present

## 2023-10-12 ENCOUNTER — Ambulatory Visit: Payer: Medicare Other | Admitting: Physical Medicine and Rehabilitation

## 2023-10-12 ENCOUNTER — Other Ambulatory Visit: Payer: Self-pay

## 2023-10-12 DIAGNOSIS — M48062 Spinal stenosis, lumbar region with neurogenic claudication: Secondary | ICD-10-CM

## 2023-10-12 DIAGNOSIS — M5416 Radiculopathy, lumbar region: Secondary | ICD-10-CM

## 2023-10-12 MED ORDER — METHYLPREDNISOLONE ACETATE 40 MG/ML IJ SUSP
40.0000 mg | Freq: Once | INTRAMUSCULAR | Status: AC
  Administered 2023-10-12: 40 mg

## 2023-10-12 NOTE — Progress Notes (Signed)
Tracey Armstrong - 88 y.o. female MRN [id number]  Date of birth: 30-May-1935  Office Visit Note: Visit Date: 10/12/2023 PCP: [place], [name], MD Referred by: [place], [name], MD  Subjective: Chief Complaint  Patient presents with   Lower Back - Pain   HPI:  Tracey Armstrong is a 88 y.o. female who comes in today for planned repeat Bilateral L4-5  Lumbar Transforaminal epidural steroid injection with fluoroscopic guidance.  The patient has failed conservative care including home exercise, medications, time and activity modification.  This injection will be diagnostic and hopefully therapeutic.  Please see requesting physician notes for further details and justification. Patient received more than 50% pain relief from prior injection.   Referring: Ellin Goodie, FNP   ROS Otherwise per HPI.  Assessment & Plan: Visit Diagnoses:    ICD-10-CM   1. Lumbar radiculopathy  M54.16 XR C-ARM NO REPORT    Epidural Steroid injection    methylPREDNISolone acetate (DEPO-MEDROL) injection 40 mg    2. Spinal stenosis of lumbar region with neurogenic claudication  M48.062 XR C-ARM NO REPORT    Epidural Steroid injection    methylPREDNISolone acetate (DEPO-MEDROL) injection 40 mg      Plan: No additional findings.   Meds & Orders:  Meds ordered this encounter  Medications   methylPREDNISolone acetate (DEPO-MEDROL) injection 40 mg    Orders Placed This Encounter  Procedures   XR C-ARM NO REPORT   Epidural Steroid injection    Follow-up: Return for visit to requesting provider as needed.   Procedures: No procedures performed  Lumbosacral Transforaminal Epidural Steroid Injection - Sub-Pedicular Approach with Fluoroscopic Guidance  Patient: Tracey Armstrong      Date of Birth: [birth year] MRN: [id number] PCP: [place], [name], MD      Visit Date: 10/12/2023   Universal Protocol:    Date/Time: 10/12/2023  Consent Given By: the patient  Position: PRONE  Additional Comments: Vital signs were  monitored before and after the procedure. Patient was prepped and draped in the usual sterile fashion. The correct patient, procedure, and site was verified.   Injection Procedure Details:   Procedure diagnoses: Lumbar radiculopathy [M54.16]    Meds Administered:  Meds ordered this encounter  Medications   methylPREDNISolone acetate (DEPO-MEDROL) injection 40 mg    Laterality: Bilateral  Location/Site: L4  Needle:5.0 in., 22 ga.  Short bevel or Quincke spinal needle  Needle Placement: Transforaminal  Findings:    -Comments: Excellent flow of contrast along the nerve, nerve root and into the epidural space.  Procedure Details: After squaring off the end-plates to get a true AP view, the C-arm was positioned so that an oblique view of the foramen as noted above was visualized. The target area is just inferior to the "nose of the scotty dog" or sub pedicular. The soft tissues overlying this structure were infiltrated with 2-3 ml. of 1% Lidocaine without Epinephrine.  The spinal needle was inserted toward the target using a "trajectory" view along the fluoroscope beam.  Under AP and lateral visualization, the needle was advanced so it did not puncture dura and was located close the 6 O'Clock position of the pedical in AP tracterory. Biplanar projections were used to confirm position. Aspiration was confirmed to be negative for CSF and/or blood. A 1-2 ml. volume of Isovue-250 was injected and flow of contrast was noted at each level. Radiographs were obtained for documentation purposes.   After attaining the desired flow of contrast documented above, a 0.5 to 1.0 ml test  dose of 0.25% Marcaine was injected into each respective transforaminal space.  The patient was observed for 90 seconds post injection.  After no sensory deficits were reported, and normal lower extremity motor function was noted,   the above injectate was administered so that equal amounts of the injectate were placed at  each foramen (level) into the transforaminal epidural space.   Additional Comments:  No complications occurred Dressing: 2 x 2 sterile gauze and Band-Aid    Post-procedure details: Patient was observed during the procedure. Post-procedure instructions were reviewed.  Patient left the clinic in stable condition.    Clinical History: CT LUMBAR SPINE WITHOUT CONTRAST   TECHNIQUE:  Multidetector CT imaging of the lumbar spine was performed without  intravenous contrast administration. Multiplanar CT image  reconstructions were also generated.   COMPARISON:  Lumbar spine radiographs 04/04/2020 and MRI 09/09/2018   FINDINGS:  Segmentation: 5 lumbar type vertebrae.   Alignment: Mild thoracolumbar levoscoliosis. Grade 1 anterolisthesis  of L4 on L5, unchanged from the prior MRI.   Vertebrae: L1 superior endplate compression fracture with 30%  vertebral body height loss and 4 mm retropulsion of the superior  endplate. A fracture line remains visible although there is  surrounding sclerosis. Hemangioma in the L4 vertebral body.   Paraspinal and other soft tissues: Aortic atherosclerosis without  aneurysm.   Disc levels: Disc bulging and posterior element hypertrophy result  in chronic mild-to-moderate spinal stenosis at L3-4 and moderate to  severe spinal stenosis at L4-5. No significant spinal stenosis  related to mild L1 retropulsion.   IMPRESSION:  1. Possibly subacute L1 compression fracture with 30% height loss  and 4 mm retropulsion. No associated spinal stenosis.  2. Chronic mild-to-moderate spinal stenosis at L3-4 and moderate to  severe spinal stenosis at L4-5.  3. Aortic Atherosclerosis (ICD10-I70.0).    Electronically Signed    By: Sebastian Ache M.D.    On: 04/04/2020 17:39     Objective:  VS:  HT:    WT:   BMI:     BP:   HR: bpm  TEMP: ( )  RESP:  Physical Exam Vitals and nursing note reviewed.  Constitutional:      General: She is not in acute  distress.    Appearance: Normal appearance. She is not ill-appearing.  HENT:     Head: Normocephalic and atraumatic.     Right Ear: External ear normal.     Left Ear: External ear normal.  Eyes:     Extraocular Movements: Extraocular movements intact.  Cardiovascular:     Rate and Rhythm: Normal rate.     Pulses: Normal pulses.  Pulmonary:     Effort: Pulmonary effort is normal. No respiratory distress.  Abdominal:     General: There is no distension.     Palpations: Abdomen is soft.  Musculoskeletal:        General: Tenderness present.     Cervical back: Neck supple.     Right lower leg: No edema.     Left lower leg: No edema.     Comments: Patient has good distal strength with no pain over the greater trochanters.  No clonus or focal weakness.  Skin:    Findings: No erythema, lesion or rash.  Neurological:     General: No focal deficit present.     Mental Status: She is alert and oriented to person, place, and time.     Sensory: No sensory deficit.     Motor: No weakness or  abnormal muscle tone.     Coordination: Coordination normal.  Psychiatric:        Mood and Affect: Mood normal.        Behavior: Behavior normal.      Imaging: XR C-ARM NO REPORT Result Date: 10/12/2023 Please see Notes tab for imaging impression.

## 2023-10-12 NOTE — Patient Instructions (Signed)

## 2023-10-12 NOTE — Procedures (Signed)
Lumbosacral Transforaminal Epidural Steroid Injection - Sub-Pedicular Approach with Fluoroscopic Guidance  Patient: Tracey Armstrong      Date of Birth: Jun 15, 1935 MRN: [id number] PCP: [place], [name], MD      Visit Date: 10/12/2023   Universal Protocol:    Date/Time: 10/12/2023  Consent Given By: the patient  Position: PRONE  Additional Comments: Vital signs were monitored before and after the procedure. Patient was prepped and draped in the usual sterile fashion. The correct patient, procedure, and site was verified.   Injection Procedure Details:   Procedure diagnoses: Lumbar radiculopathy [M54.16]    Meds Administered:  Meds ordered this encounter  Medications   methylPREDNISolone acetate (DEPO-MEDROL) injection 40 mg    Laterality: Bilateral  Location/Site: L4  Needle:5.0 in., 22 ga.  Short bevel or Quincke spinal needle  Needle Placement: Transforaminal  Findings:    -Comments: Excellent flow of contrast along the nerve, nerve root and into the epidural space.  Procedure Details: After squaring off the end-plates to get a true AP view, the C-arm was positioned so that an oblique view of the foramen as noted above was visualized. The target area is just inferior to the "nose of the scotty dog" or sub pedicular. The soft tissues overlying this structure were infiltrated with 2-3 ml. of 1% Lidocaine without Epinephrine.  The spinal needle was inserted toward the target using a "trajectory" view along the fluoroscope beam.  Under AP and lateral visualization, the needle was advanced so it did not puncture dura and was located close the 6 O'Clock position of the pedical in AP tracterory. Biplanar projections were used to confirm position. Aspiration was confirmed to be negative for CSF and/or blood. A 1-2 ml. volume of Isovue-250 was injected and flow of contrast was noted at each level. Radiographs were obtained for documentation purposes.   After attaining the desired  flow of contrast documented above, a 0.5 to 1.0 ml test dose of 0.25% Marcaine was injected into each respective transforaminal space.  The patient was observed for 90 seconds post injection.  After no sensory deficits were reported, and normal lower extremity motor function was noted,   the above injectate was administered so that equal amounts of the injectate were placed at each foramen (level) into the transforaminal epidural space.   Additional Comments:  No complications occurred Dressing: 2 x 2 sterile gauze and Band-Aid    Post-procedure details: Patient was observed during the procedure. Post-procedure instructions were reviewed.  Patient left the clinic in stable condition.

## 2023-10-19 ENCOUNTER — Other Ambulatory Visit: Payer: Self-pay | Admitting: Family Medicine

## 2023-10-19 DIAGNOSIS — I700 Atherosclerosis of aorta: Secondary | ICD-10-CM

## 2023-10-19 DIAGNOSIS — R2689 Other abnormalities of gait and mobility: Secondary | ICD-10-CM | POA: Diagnosis not present

## 2023-10-19 DIAGNOSIS — R2681 Unsteadiness on feet: Secondary | ICD-10-CM | POA: Diagnosis not present

## 2023-10-19 DIAGNOSIS — M5459 Other low back pain: Secondary | ICD-10-CM | POA: Diagnosis not present

## 2023-10-21 ENCOUNTER — Other Ambulatory Visit: Payer: Self-pay | Admitting: Family Medicine

## 2023-10-21 DIAGNOSIS — I10 Essential (primary) hypertension: Secondary | ICD-10-CM

## 2023-10-22 ENCOUNTER — Other Ambulatory Visit: Payer: Self-pay | Admitting: Family Medicine

## 2023-10-22 ENCOUNTER — Ambulatory Visit: Payer: Self-pay | Admitting: Family Medicine

## 2023-10-22 DIAGNOSIS — F411 Generalized anxiety disorder: Secondary | ICD-10-CM

## 2023-10-22 DIAGNOSIS — N3001 Acute cystitis with hematuria: Secondary | ICD-10-CM | POA: Diagnosis not present

## 2023-10-22 DIAGNOSIS — R300 Dysuria: Secondary | ICD-10-CM | POA: Diagnosis not present

## 2023-10-22 NOTE — Telephone Encounter (Signed)
  Chief Complaint: UTI symptoms Symptoms: pain with urination, dark colored urine Frequency: over a week Pertinent Negatives: Patient denies fever Disposition: [] ED /[x] Urgent Care (no appt availability in office) / [] Appointment(In office/virtual)/ []  [facility]/ [] Home Care/ [] Refused Recommended Disposition /[] [facility]/ []  Follow-up with PCP Additional Notes: patient c/o pain with urination and dark colored urine. Patient states this has been going on for a week. Patient is wanting an antibiotic sent in but states that she hasn't been feeling well. This RN questioned about getting her in to see her PCP. Patient's son was heard saying that he would take her to Urgent Care. Patient states that she is going to Urgent Care. All questions answered.    Reason for Disposition  Blood in urine (red, pink, or tea-colored)  Answer Assessment - Initial Assessment Questions 1. SEVERITY: How bad is the pain?  (e.g., Scale 1-10; mild, moderate, or severe)   - MILD (1-3): complains slightly about urination hurting   - MODERATE (4-7): interferes with normal activities     - SEVERE (8-10): excruciating, unwilling or unable to urinate because of the pain      7 out of 10 2. FREQUENCY: How many times have you had painful urination today?      3 3. PATTERN: Is pain present every time you urinate or just sometimes?      Painful every time 4. ONSET: When did the painful urination start?      Going on for close to a week 5. FEVER: Do you have a fever? If Yes, ask: What is your temperature, how was it measured, and when did it start?     no 6. PAST UTI: Have you had a urine infection before? If Yes, ask: When was the last time? and What happened that time?      3-4 months ago 7. CAUSE: What do you think is causing the painful urination?  (e.g., UTI, scratch, Herpes sore)     Thinks she has a UTI 8. OTHER SYMPTOMS: Do you have any other symptoms? (e.g., blood in  urine, flank pain, genital sores, urgency, vaginal discharge)     Blood in urine  Protocols used: Urination Pain - Female-A-AH

## 2023-10-22 NOTE — Telephone Encounter (Signed)
  Attempted to call patient to triage. Received VMB. LVM to return call. Will continue to attempt.  Copied from CRM [id number]. Topic: Clinical - Pink Word Triage >> Oct 22, 2023  3:13 PM Burnard DEL wrote: Reason for Triage: patients son gave her a otc uti test due to her having a burning sensation while urination and frequent urination. The test came back positive. He called in today stating is there any way an antibiotic could be sent without her being seen.He stated that she has a hard time with mobility and its hard for him to get her out of the house to a doctors appointment. Son's#:[phone](quint)

## 2023-10-23 DIAGNOSIS — R2681 Unsteadiness on feet: Secondary | ICD-10-CM | POA: Diagnosis not present

## 2023-10-23 DIAGNOSIS — R2689 Other abnormalities of gait and mobility: Secondary | ICD-10-CM | POA: Diagnosis not present

## 2023-10-23 DIAGNOSIS — R488 Other symbolic dysfunctions: Secondary | ICD-10-CM | POA: Diagnosis not present

## 2023-10-23 DIAGNOSIS — M5459 Other low back pain: Secondary | ICD-10-CM | POA: Diagnosis not present

## 2023-10-23 DIAGNOSIS — R1311 Dysphagia, oral phase: Secondary | ICD-10-CM | POA: Diagnosis not present

## 2023-10-23 DIAGNOSIS — R4789 Other speech disturbances: Secondary | ICD-10-CM | POA: Diagnosis not present

## 2023-10-23 DIAGNOSIS — R1312 Dysphagia, oropharyngeal phase: Secondary | ICD-10-CM | POA: Diagnosis not present

## 2023-10-26 ENCOUNTER — Other Ambulatory Visit: Payer: Self-pay

## 2023-10-26 DIAGNOSIS — F411 Generalized anxiety disorder: Secondary | ICD-10-CM

## 2023-10-26 NOTE — Telephone Encounter (Signed)
 Last Ov 08/13/23 Filled 09/15/23   Copied from CRM #[id number]. Topic: Clinical - Medication Refill >> Oct 26, 2023  4:18 PM Gerardine PARAS wrote: Most Recent Primary Care Visit:  Provider: JORDAN, [name]  Department: [facility]  Visit Type: OFFICE VISIT  Date: 02/11/2023  Medication: ALPRAZolam  (XANAX ) 1 MG tablet   Has the patient contacted their pharmacy? Yes, needs new prescription  (Agent: If no, request that the patient contact the pharmacy for the refill. If patient does not wish to contact the pharmacy document the reason why and proceed with request.) (Agent: If yes, when and what did the pharmacy advise?)  Is this the correct pharmacy for this prescription? Yes If no, delete pharmacy and type the correct one.  This is the patient's preferred pharmacy:  [id number] [place], [place] - [address] AT [facility] [address] [facility] [address] [place] [address] Phone: [phone] Fax: [phone]   Has the prescription been filled recently? Yes  Is the patient out of the medication? No 2 day supply  Has the patient been seen for an appointment in the last year OR does the patient have an upcoming appointment? No  Can we respond through MyChart? Yes  Agent: Please be advised that Rx refills may take up to 3 business days. We ask that you follow-up with your pharmacy.  Patient also mentions she is wondering when she needs to get scheduled with Dr. Jordan for another appointment

## 2023-10-27 ENCOUNTER — Other Ambulatory Visit: Payer: Self-pay | Admitting: Family Medicine

## 2023-10-27 DIAGNOSIS — N393 Stress incontinence (female) (male): Secondary | ICD-10-CM

## 2023-10-28 DIAGNOSIS — R1312 Dysphagia, oropharyngeal phase: Secondary | ICD-10-CM | POA: Diagnosis not present

## 2023-10-28 DIAGNOSIS — R1311 Dysphagia, oral phase: Secondary | ICD-10-CM | POA: Diagnosis not present

## 2023-10-28 DIAGNOSIS — R4789 Other speech disturbances: Secondary | ICD-10-CM | POA: Diagnosis not present

## 2023-10-28 DIAGNOSIS — M5459 Other low back pain: Secondary | ICD-10-CM | POA: Diagnosis not present

## 2023-10-28 DIAGNOSIS — R2689 Other abnormalities of gait and mobility: Secondary | ICD-10-CM | POA: Diagnosis not present

## 2023-10-28 DIAGNOSIS — R2681 Unsteadiness on feet: Secondary | ICD-10-CM | POA: Diagnosis not present

## 2023-11-03 ENCOUNTER — Other Ambulatory Visit: Payer: Self-pay | Admitting: Family Medicine

## 2023-11-03 DIAGNOSIS — R4789 Other speech disturbances: Secondary | ICD-10-CM | POA: Diagnosis not present

## 2023-11-03 DIAGNOSIS — R1312 Dysphagia, oropharyngeal phase: Secondary | ICD-10-CM | POA: Diagnosis not present

## 2023-11-03 DIAGNOSIS — R1311 Dysphagia, oral phase: Secondary | ICD-10-CM | POA: Diagnosis not present

## 2023-11-03 DIAGNOSIS — R2681 Unsteadiness on feet: Secondary | ICD-10-CM | POA: Diagnosis not present

## 2023-11-03 DIAGNOSIS — R2689 Other abnormalities of gait and mobility: Secondary | ICD-10-CM | POA: Diagnosis not present

## 2023-11-03 DIAGNOSIS — F411 Generalized anxiety disorder: Secondary | ICD-10-CM

## 2023-11-03 DIAGNOSIS — M5459 Other low back pain: Secondary | ICD-10-CM | POA: Diagnosis not present

## 2023-11-04 DIAGNOSIS — R1311 Dysphagia, oral phase: Secondary | ICD-10-CM | POA: Diagnosis not present

## 2023-11-04 DIAGNOSIS — R4789 Other speech disturbances: Secondary | ICD-10-CM | POA: Diagnosis not present

## 2023-11-04 DIAGNOSIS — R1312 Dysphagia, oropharyngeal phase: Secondary | ICD-10-CM | POA: Diagnosis not present

## 2023-11-04 DIAGNOSIS — R2681 Unsteadiness on feet: Secondary | ICD-10-CM | POA: Diagnosis not present

## 2023-11-04 DIAGNOSIS — M5459 Other low back pain: Secondary | ICD-10-CM | POA: Diagnosis not present

## 2023-11-04 DIAGNOSIS — R2689 Other abnormalities of gait and mobility: Secondary | ICD-10-CM | POA: Diagnosis not present

## 2023-11-09 ENCOUNTER — Ambulatory Visit: Payer: Medicare Other | Admitting: Family Medicine

## 2023-11-10 DIAGNOSIS — R2689 Other abnormalities of gait and mobility: Secondary | ICD-10-CM | POA: Diagnosis not present

## 2023-11-10 DIAGNOSIS — R1311 Dysphagia, oral phase: Secondary | ICD-10-CM | POA: Diagnosis not present

## 2023-11-10 DIAGNOSIS — R1312 Dysphagia, oropharyngeal phase: Secondary | ICD-10-CM | POA: Diagnosis not present

## 2023-11-10 DIAGNOSIS — R2681 Unsteadiness on feet: Secondary | ICD-10-CM | POA: Diagnosis not present

## 2023-11-10 DIAGNOSIS — M5459 Other low back pain: Secondary | ICD-10-CM | POA: Diagnosis not present

## 2023-11-10 DIAGNOSIS — R4789 Other speech disturbances: Secondary | ICD-10-CM | POA: Diagnosis not present

## 2023-11-11 ENCOUNTER — Encounter: Payer: Self-pay | Admitting: Family Medicine

## 2023-11-11 ENCOUNTER — Ambulatory Visit (INDEPENDENT_AMBULATORY_CARE_PROVIDER_SITE_OTHER): Payer: Medicare Other | Admitting: Family Medicine

## 2023-11-11 VITALS — BP 126/80 | HR 96 | Resp 16 | Ht 62.0 in | Wt 159.0 lb

## 2023-11-11 DIAGNOSIS — I10 Essential (primary) hypertension: Secondary | ICD-10-CM

## 2023-11-11 DIAGNOSIS — D329 Benign neoplasm of meninges, unspecified: Secondary | ICD-10-CM

## 2023-11-11 DIAGNOSIS — E782 Mixed hyperlipidemia: Secondary | ICD-10-CM

## 2023-11-11 DIAGNOSIS — R569 Unspecified convulsions: Secondary | ICD-10-CM | POA: Diagnosis not present

## 2023-11-11 DIAGNOSIS — M5416 Radiculopathy, lumbar region: Secondary | ICD-10-CM | POA: Diagnosis not present

## 2023-11-11 DIAGNOSIS — F411 Generalized anxiety disorder: Secondary | ICD-10-CM | POA: Diagnosis not present

## 2023-11-11 DIAGNOSIS — M270 Developmental disorders of jaws: Secondary | ICD-10-CM

## 2023-11-11 DIAGNOSIS — E559 Vitamin D deficiency, unspecified: Secondary | ICD-10-CM

## 2023-11-11 LAB — COMPREHENSIVE METABOLIC PANEL
ALT: 17 U/L (ref 0–35)
AST: 20 U/L (ref 0–37)
Albumin: 4.1 g/dL (ref 3.5–5.2)
Alkaline Phosphatase: 60 U/L (ref 39–117)
BUN: 13 mg/dL (ref 6–23)
CO2: 28 meq/L (ref 19–32)
Calcium: 9.6 mg/dL (ref 8.4–10.5)
Chloride: 103 meq/L (ref 96–112)
Creatinine, Ser: 0.93 mg/dL (ref 0.40–1.20)
GFR: 55.51 mL/min — ABNORMAL LOW (ref >60.00)
Glucose, Bld: 90 mg/dL (ref 70–99)
Potassium: 4.3 meq/L (ref 3.5–5.1)
Sodium: 140 meq/L (ref 135–145)
Total Bilirubin: 0.3 mg/dL (ref 0.2–1.2)
Total Protein: 6.9 g/dL (ref 6.0–8.3)

## 2023-11-11 LAB — VITAMIN D 25 HYDROXY (VIT D DEFICIENCY, FRACTURES): VITD: 77.26 ng/mL (ref 30.00–100.00)

## 2023-11-11 NOTE — Patient Instructions (Addendum)
A few things to remember from today's visit:  Essential hypertension - Plan: Comprehensive metabolic panel  Generalized anxiety disorder  Convulsions, unspecified convulsion type (HCC), Chronic  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HCC), Chronic  Torus palatinus  Vitamin D deficiency, unspecified - Plan: VITAMIN D 25 Hydroxy (Vit-D Deficiency, Fractures)  No changes today. Continue fall precautions. Continue following with dentist and adequate mouth hygiene.  If you need refills for medications you take chronically, please call your pharmacy. Do not use My Chart to request refills or for acute issues that need immediate attention. If you send a my chart message, it may take a few days to be addressed, specially if I am not in the office.  Please be sure medication list is accurate. If a new problem present, please set up appointment sooner than planned today.

## 2023-11-11 NOTE — Assessment & Plan Note (Signed)
We discussed Dx. She is inquiring about surgical treatment.  States that she has seen 3 oral surgeons but not surgical treatment was recommended. Stressed the importance about good Administrator.

## 2023-11-11 NOTE — Assessment & Plan Note (Signed)
Continue Ergocalciferol 50,000 U q 2 weeks. Further recommendations according to 25 OH vit D result.

## 2023-11-11 NOTE — Assessment & Plan Note (Signed)
Problem is stable. Continue Alprazolam 1/2-1 tab mg bid prn. She has been on this medication for many years and has not tolerated SSRI's and SNRI's. PMP reviewed. States that she is also on Hydrocodone-Acetaminophen, we discussed risk of interaction with Benzodiazepines. F/U in 5-6 months, before if needed.

## 2023-11-11 NOTE — Assessment & Plan Note (Signed)
Brain MRI done on 04/12/21:1 cm right parietal convexity meningioma with no associated mass effect, stable. She follows with neurologist regularly.

## 2023-11-11 NOTE — Assessment & Plan Note (Signed)
BP adequately controlled. Continue verapamil 180 mg daily and low salt diet. Continue monitoring BP regularly.

## 2023-11-11 NOTE — Progress Notes (Unsigned)
HPI: Tracey Armstrong is a 89 y.o. female with a PMHx significant for HTN, chronic migraine, atherosclerosis of aorta, GERD, lumbar radiculopathy, benign neoplasm of meninges, OA, vitamin D deficiency, HLD, iron deficiency anemia, anxiety/depression, and chronic pain, among some, who is here today for chronic disease management.  Last seen on 02/11/2023.  She was seen at urgent care on 10/22/2023 for UTI and given Macrobid 100 mg twice daily for 7 days. Her symptoms have resolved.   Anxiety:  Currently on alprazolam 0.5-1 mg twice daily prn.  Denies any problems with the medications or side effects.  She has not tolerated SSRI's and SNRI's in the past, caused headaches.  Seizures:  Currently on Keppra 500 mg bid and gabapentin 300 mg 2 tablets daily prescribed by her neurologist.  She has not has seizures since started on Keppra. Brain MRI done on 04/12/21:1 cm right parietal convexity meningioma with no associated mass effect, stable.  Chronic back pain: With radiation of LLE, pretibial pain specially. She says she has an epidural injection in her back every 4 months, which she did not fel like it help. C/O severe pain down LLE, no new symptoms.  Currently on Norco 5-325 mg 0.5-1 tablets two times daily as needed. She does not think medication is helping. States that a friend is taking Oxycodone and wonders if this one may help more with pain. She follows with neurosurgery and pain management.   Urinary frequency:  States that she went to [facility] one time in 08/2022 but hasn't returned since.  She is still taking oxybutynin 10 mg at bedtime. She tried to stop taking it but her symptoms returned.  Negative for dysuria, gross hematuria,or decreased urine output.  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 Negative for pain. She would like it removed.  She tells me that she has already seen 3 oral surgeons who advised against surgery.   She says  her mouth is dry, and this makes it difficult for her to swallow.   Epigastric pain:  Patient also mentions she has been having some epigastric /lower chest pain while eating.  She was told she had a hiatal hernia after a prior abdominal surgery.  Still taking omeprazole 20 mg daily.  Denies any heartburn, nausea, or vomiting.  Denies any pain with exertion or associated palpitations, SOB,or diaphoresis.  Hypertension:  Currently on verapamil 180 mg daily.  She checks her BP at home and says it stays normal.  Negative for unusual or severe headache, visual changes, focal weakness, or edema.  Lab Results  Component Value Date   CREATININE 0.90 07/30/2022   BUN 11 07/30/2022   NA 136 07/30/2022   K 4.4 07/30/2022   CL 100 07/30/2022   CO2 30 07/30/2022   Hyperlipidemia: Currently on pravastatin 20 mg daily.  Side effects from medication: none Lab Results  Component Value Date   CHOL 207 (H) 04/12/2021   HDL 70 04/12/2021   LDLCALC 121 (H) 04/12/2021   TRIG 80 04/12/2021   CHOLHDL 3.0 04/12/2021   Vit D deficiency: Still taking vitamin D 16109 units every two weeks.   Review of Systems  Constitutional:  Negative for activity change, appetite change, chills and fever.  HENT:  Negative for mouth sores and sore throat.   Endocrine: Negative for cold intolerance and heat intolerance.  Genitourinary:  Negative for dysuria and pelvic pain.  Musculoskeletal:  Positive for arthralgias, back pain and gait problem.  Skin:  Negative for rash.  Neurological:  Negative for syncope and facial asymmetry.  Psychiatric/Behavioral:  Negative for confusion and hallucinations. The patient is nervous/anxious.   See other pertinent positives and negatives in HPI.  Current Outpatient Medications on File Prior to Visit  Medication Sig Dispense Refill   ALPRAZolam (XANAX) 1 MG tablet Take 0.5-1 tablets (0.5-1 mg total) by mouth 2 (two) times daily as needed for anxiety. 30 tablet 0   ammonium  lactate (LAC-HYDRIN) 12 % lotion Apply 1 Application topically daily as needed for dry skin (on heels). 222 g 0   antiseptic oral rinse (BIOTENE) LIQD Use 3 mls 3-4 times per day for dry mouth. Do not swallow. 237 mL 0   Cholecalciferol (VITAMIN D3) 1.25 MG (50000 UT) CAPS TAKE 1 CAPSULE BY MOUTH EVERY 14 DAYS 7 capsule 1   Dextran 70-Hypromellose (ARTIFICIAL TEARS PF OP) Place 1-2 drops into both eyes See admin instructions. Instill 1-2 drops into both eyes at bedtime and an additional 2-3 times a day as needed for dryness     diclofenac (FLECTOR) 1.3 % PTCH Place 1 patch onto the skin 2 (two) times daily. 60 patch 1   diphenhydramine-acetaminophen (TYLENOL PM) 25-500 MG TABS tablet Take 1.5 tablets by mouth at bedtime.     gabapentin (NEURONTIN) 300 MG capsule Take 2 capsules daily. If additional control needed, can increase to 3 capsules daily. 90 capsule 11   HYDROcodone-acetaminophen (NORCO) 5-325 MG tablet Take 0.5-1 tablets by mouth 2 (two) times daily as needed for severe pain. 45 tablet 0   levETIRAcetam (KEPPRA) 500 MG tablet Take 1 tablet twice a day 60 tablet 11   Magnesium 400 MG TABS Take 400 mg by mouth at bedtime.     MAXALT-MLT 10 MG disintegrating tablet DISSOLVE ONE TABLET BY MOUTH AS NEEDED HEADACHE. 9 tablet 11   Melatonin 10 MG TABS Take 10 mg by mouth at bedtime as needed (for sleep).     omeprazole (PRILOSEC OTC) 20 MG tablet Take 20 mg by mouth daily before breakfast.     oxybutynin (DITROPAN-XL) 10 MG 24 hr tablet TAKE 1 TABLET(10 MG) BY MOUTH AT BEDTIME 90 tablet 1   POTASSIUM PO Take 1 tablet by mouth daily with lunch.     pravastatin (PRAVACHOL) 20 MG tablet TAKE 1 TABLET(20 MG) BY MOUTH 3 TIMES A WEEK 13 tablet 5   pyridOXINE (VITAMIN B-6) 100 MG tablet Take 400 mg by mouth daily with lunch.     riboflavin (VITAMIN B-2) 100 MG TABS tablet Take 400 mg by mouth daily with lunch.     tretinoin (RETIN-A) 0.1 % cream APPLY TO AFFECTED AREA EVERY DAY AT BEDTIME 45 g 2    verapamil (VERELAN) 180 MG 24 hr capsule TAKE 1 CAPSULE(180 MG) BY MOUTH DAILY 90 capsule 3   No current facility-administered medications on file prior to visit.    Past Medical History:  Diagnosis Date   Anxiety    Arthritis    Chronic insomnia 03/30/2015   Chronic low back pain 12/02/2016   Depression    GERD (gastroesophageal reflux disease)    Hiatal hernia    High cholesterol    Hypertelorism    Hypertension    Meningioma (HCC)    Migraine    Allergies  Allergen Reactions   Dihydrotachysterol Other (See Comments)    Dihydrotachysterol is a form of vitamin D. Might be "DHT Intensol" (Name Brand)- Reaction unknown- tolerating 50,000 units of Vitamin D-2 (Drisdol) in 2022  Trazodone And Nefazodone Other (See Comments)    Worsens headaches   Valium [Diazepam] Other (See Comments)    headaches   Citalopram Other (See Comments)    Other reaction(s): Headache   Other Other (See Comments)    "States all antidepressants cause migraines." - tolerates Xanax     Social History   Socioeconomic History   Marital status: Widowed    Spouse name: Not on file   Number of children: 4   Years of education: 6   Highest education level: Not on file  Occupational History   Occupation: Retired  Tobacco Use   Smoking status: Never   Smokeless tobacco: Never  Vaping Use   Vaping status: Never Used  Substance and Sexual Activity   Alcohol use: No   Drug use: No   Sexual activity: Not Currently  Other Topics Concern   Not on file  Social History Narrative   Lives in [facility] - [facility]      Patient drinks 1 glass of tea daily.      Patient is right handed.   Social Drivers of Corporate investment banker Strain: Low Risk  (11/19/2022)   Overall Financial Resource Strain (CARDIA)    Difficulty of Paying Living Expenses: Not hard at all  Food Insecurity: No Food Insecurity (11/19/2022)   Hunger Vital Sign    Worried About Running Out of Food in the Last Year: Never true     Ran Out of Food in the Last Year: Never true  Transportation Needs: No Transportation Needs (11/19/2022)   PRAPARE - Administrator, Civil Service (Medical): No    Lack of Transportation (Non-Medical): No  Physical Activity: Insufficiently Active (11/19/2022)   Exercise Vital Sign    Days of Exercise per Week: 2 days    Minutes of Exercise per Session: 60 min  Stress: No Stress Concern Present (11/19/2022)   [employer] of Occupational Health - Occupational Stress Questionnaire    Feeling of Stress : Not at all  Social Connections: Moderately Integrated (11/19/2022)   Social Connection and Isolation Panel [NHANES]    Frequency of Communication with Friends and Family: More than three times a week    Frequency of Social Gatherings with Friends and Family: More than three times a week    Attends Religious Services: More than 4 times per year    Active Member of [employer] or Organizations: Yes    Attends Banker Meetings: More than 4 times per year    Marital Status: Widowed    Today's Vitals   11/11/23 0957  BP: 126/80  Pulse: 96  Resp: 16  SpO2: 96%  Weight: 159 lb (72.1 kg)  Height: 5\' 2"  (1.575 m)   Body mass index is 29.08 kg/m.  Physical Exam Vitals and nursing note reviewed.  Constitutional:      General: She is not in acute distress.    Appearance: She is well-developed.  HENT:     Head: Normocephalic and atraumatic.     Mouth/Throat:     Mouth: Mucous membranes are dry.     Pharynx: Oropharynx is clear. Uvula midline.   Eyes:     Conjunctiva/sclera: Conjunctivae normal.  Cardiovascular:     Rate and Rhythm: Normal rate and regular rhythm.     Heart sounds: No murmur heard.    Comments: Trace swelling in LLE PT pulses palpable. Pulmonary:     Effort: Pulmonary effort is normal. No respiratory distress.  Breath sounds: Normal breath sounds.  Abdominal:     Palpations: Abdomen is soft. There is no mass.     Tenderness: There is no  abdominal tenderness.  Lymphadenopathy:     Cervical: No cervical adenopathy.  Skin:    General: Skin is warm.     Findings: No erythema or rash.  Neurological:     General: No focal deficit present.     Mental Status: She is alert and oriented to person, place, and time.     Cranial Nerves: No cranial nerve deficit.     Comments: Antalgic pain assisted with a walker.  Psychiatric:        Mood and Affect: Affect normal. Mood is anxious.     Comments: Well groomed, good eye contact.    ASSESSMENT AND PLAN:  Tracey Armstrong was seen today for chronic disease management.   Lab Results  Component Value Date   NA 140 11/11/2023   CL 103 11/11/2023   K 4.3 11/11/2023   CO2 28 11/11/2023   BUN 13 11/11/2023   CREATININE 0.93 11/11/2023   GFR 55.51 (L) 11/11/2023   CALCIUM 9.6 11/11/2023   ALBUMIN 4.1 11/11/2023   GLUCOSE 90 11/11/2023   Lab Results  Component Value Date   ALT 17 11/11/2023   AST 20 11/11/2023   ALKPHOS 60 11/11/2023   BILITOT 0.3 11/11/2023   Lab Results  Component Value Date   VD25OH 77.26 11/11/2023   Lumbar radiculopathy Assessment & Plan: Problem does not seems well controlled. Currently on Gabapentin and has received epidural injections, reports that did not help. She is on Hydrocodone-Acetaminophen, does not feel like it helps with pain. Recommend discussing this with pain management.  Essential hypertension Assessment & Plan: BP adequately controlled. Continue verapamil 180 mg daily and low salt diet. Continue monitoring BP regularly.  Orders: -     Comprehensive metabolic panel; Future  Convulsions, unspecified convulsion type (HCC) Well controlled. Currently on Keppra and Gabapentin. Follows with Dr Karel Jarvis.  Benign neoplasm of meninges [facility]) Assessment & Plan: Brain MRI done on 04/12/21:1 cm right parietal convexity meningioma with no associated mass effect, stable. She follows with neurologist regularly.  Torus palatinus Assessment  & Plan: We discussed Dx and prognosis.  She is inquiring about surgical treatment.  States that she has seen 3 oral surgeons but not surgical treatment was recommended. Stressed the importance about good Radio producer. Follows with dentist regularly.  Generalized anxiety disorder Assessment & Plan: Problem is stable. Continue Alprazolam 1/2-1 tab mg bid prn. She has been on this medication for many years and has not tolerated SSRI's and SNRI's. PMP reviewed. Se is also on Hydrocodone-Acetaminophen, we discussed risk of interaction with Benzodiazepines. F/U in 5-6 months, before if needed.  Vitamin D deficiency, unspecified Assessment & Plan: Continue Ergocalciferol 50,000 U q 2 weeks. Further recommendations according to 25 OH vit D result.  Orders: -     VITAMIN D 25 Hydroxy (Vit-D Deficiency, Fractures); Future  Mixed hyperlipidemia Assessment & Plan: Continue Pravastatin 20 mg daily and low fat diet. She is not fasting today.  I spent a total of 40 minutes in both face to face and non face to face activities for this visit on the date of this encounter. During this time history was obtained and documented, examination was performed, prior labs/imaging reviewed, and assessment/plan discussed.  Return in about 6 months (around 05/10/2024).  I, Suanne Marker, acting as a scribe for Declan Mier [place], MD., have documented all  relevant documentation on the behalf of Rilan Eiland [place], MD, as directed by  Andie Mungin [place], MD while in the presence of Izela Altier [place], MD.   I, Jerene Yeager [place], MD, have reviewed all documentation for this visit. The documentation on 11/11/23 for the exam, diagnosis, procedures, and orders are all accurate and complete.  Edge Mauger G. [place], MD  [facility]. [place] office.

## 2023-11-12 MED ORDER — ALPRAZOLAM 1 MG PO TABS: 0.5000 mg | ORAL_TABLET | Freq: Two times a day (BID) | ORAL | 2 refills | Status: DC | PRN

## 2023-11-12 NOTE — Assessment & Plan Note (Signed)
Continue Pravastatin 20 mg daily and low fat diet. She is not fasting today.

## 2023-11-12 NOTE — Assessment & Plan Note (Signed)
Problem does not seems well controlled. Currently on Gabapentin and has received epidural injections, reports that did not help. She is on Hydrocodone-Acetaminophen, does not feel like it helps with pain. Recommend discussing this with pain management.

## 2023-11-13 DIAGNOSIS — R1312 Dysphagia, oropharyngeal phase: Secondary | ICD-10-CM | POA: Diagnosis not present

## 2023-11-13 DIAGNOSIS — R2689 Other abnormalities of gait and mobility: Secondary | ICD-10-CM | POA: Diagnosis not present

## 2023-11-13 DIAGNOSIS — M5459 Other low back pain: Secondary | ICD-10-CM | POA: Diagnosis not present

## 2023-11-13 DIAGNOSIS — R2681 Unsteadiness on feet: Secondary | ICD-10-CM | POA: Diagnosis not present

## 2023-11-13 DIAGNOSIS — R4789 Other speech disturbances: Secondary | ICD-10-CM | POA: Diagnosis not present

## 2023-11-13 DIAGNOSIS — R1311 Dysphagia, oral phase: Secondary | ICD-10-CM | POA: Diagnosis not present

## 2023-11-17 DIAGNOSIS — R2681 Unsteadiness on feet: Secondary | ICD-10-CM | POA: Diagnosis not present

## 2023-11-17 DIAGNOSIS — R1312 Dysphagia, oropharyngeal phase: Secondary | ICD-10-CM | POA: Diagnosis not present

## 2023-11-17 DIAGNOSIS — R2689 Other abnormalities of gait and mobility: Secondary | ICD-10-CM | POA: Diagnosis not present

## 2023-11-17 DIAGNOSIS — R1311 Dysphagia, oral phase: Secondary | ICD-10-CM | POA: Diagnosis not present

## 2023-11-17 DIAGNOSIS — M5459 Other low back pain: Secondary | ICD-10-CM | POA: Diagnosis not present

## 2023-11-17 DIAGNOSIS — R4789 Other speech disturbances: Secondary | ICD-10-CM | POA: Diagnosis not present

## 2023-11-18 ENCOUNTER — Other Ambulatory Visit: Payer: Self-pay

## 2023-11-18 DIAGNOSIS — E559 Vitamin D deficiency, unspecified: Secondary | ICD-10-CM

## 2023-11-18 MED ORDER — VITAMIN D3 1.25 MG (50000 UT) PO CAPS: ORAL_CAPSULE | ORAL | 3 refills | Status: AC

## 2023-11-19 DIAGNOSIS — R2681 Unsteadiness on feet: Secondary | ICD-10-CM | POA: Diagnosis not present

## 2023-11-19 DIAGNOSIS — R1311 Dysphagia, oral phase: Secondary | ICD-10-CM | POA: Diagnosis not present

## 2023-11-19 DIAGNOSIS — M5459 Other low back pain: Secondary | ICD-10-CM | POA: Diagnosis not present

## 2023-11-19 DIAGNOSIS — R4789 Other speech disturbances: Secondary | ICD-10-CM | POA: Diagnosis not present

## 2023-11-19 DIAGNOSIS — R2689 Other abnormalities of gait and mobility: Secondary | ICD-10-CM | POA: Diagnosis not present

## 2023-11-19 DIAGNOSIS — R1312 Dysphagia, oropharyngeal phase: Secondary | ICD-10-CM | POA: Diagnosis not present

## 2023-11-24 ENCOUNTER — Encounter: Payer: Medicare Other | Admitting: Registered Nurse

## 2023-11-25 ENCOUNTER — Encounter: Payer: Medicare Other | Attending: Physical Medicine and Rehabilitation | Admitting: Registered Nurse

## 2023-11-25 VITALS — BP 132/80 | HR 95 | Ht 62.0 in | Wt 159.2 lb

## 2023-11-25 DIAGNOSIS — Z5181 Encounter for therapeutic drug level monitoring: Secondary | ICD-10-CM | POA: Diagnosis not present

## 2023-11-25 DIAGNOSIS — M47816 Spondylosis without myelopathy or radiculopathy, lumbar region: Secondary | ICD-10-CM | POA: Diagnosis not present

## 2023-11-25 DIAGNOSIS — M7062 Trochanteric bursitis, left hip: Secondary | ICD-10-CM | POA: Diagnosis not present

## 2023-11-25 DIAGNOSIS — G894 Chronic pain syndrome: Secondary | ICD-10-CM | POA: Diagnosis not present

## 2023-11-25 DIAGNOSIS — Z79891 Long term (current) use of opiate analgesic: Secondary | ICD-10-CM | POA: Diagnosis not present

## 2023-11-25 MED ORDER — HYDROCODONE-ACETAMINOPHEN 5-325 MG PO TABS: 0.5000 | ORAL_TABLET | Freq: Two times a day (BID) | ORAL | 0 refills | Status: DC | PRN

## 2023-11-25 NOTE — Progress Notes (Signed)
 Subjective:    Patient ID: Tracey Armstrong, female    DOB: [birth year], 89 y.o.   MRN: [id number]  HPI: Tracey Armstrong is a 89 y.o. female who returns for follow up appointment for chronic pain and medication refill. She states her pain is located in her lower back  and left hip pain. She rates her pain 8. Her current exercise regime is walking with her walker.   [name] Morphine  equivalent is 10.23 MME.   Oral Swab was Performed Today.     Pain Inventory Average Pain 9 Pain Right Now 8 My pain is aching  In the last 24 hours, has pain interfered with the following? General activity 8 Relation with others 8 Enjoyment of life 8 What TIME of day is your pain at its worst? varies Sleep (in general) Good  Pain is worse with: sitting and standing Pain improves with: medication and laying down in bed Relief from Meds: 5  Family History  Problem Relation Age of Onset   Heart Problems Mother    Migraines Father    Stroke Father    Epilepsy Brother    Cancer Brother 18       unknown type - metastatic   Social History   Socioeconomic History   Marital status: Widowed    Spouse name: Not on file   Number of children: 4   Years of education: 28   Highest education level: Not on file  Occupational History   Occupation: Retired  Tobacco Use   Smoking status: Never   Smokeless tobacco: Never  Vaping Use   Vaping status: Never Used  Substance and Sexual Activity   Alcohol  use: No   Drug use: No   Sexual activity: Not Currently  Other Topics Concern   Not on file  Social History Narrative   Lives in [facility] - [facility]      Patient drinks 1 glass of tea daily.      Patient is right handed.   Social Drivers of Corporate Investment Banker Strain: Low Risk  (11/19/2022)   Overall Financial Resource Strain (CARDIA)    Difficulty of Paying Living Expenses: Not hard at all  Food Insecurity: No Food Insecurity (11/19/2022)   Hunger Vital Sign    Worried About Running Out  of Food in the Last Year: Never true    Ran Out of Food in the Last Year: Never true  Transportation Needs: No Transportation Needs (11/19/2022)   PRAPARE - Administrator, Civil Service (Medical): No    Lack of Transportation (Non-Medical): No  Physical Activity: Insufficiently Active (11/19/2022)   Exercise Vital Sign    Days of Exercise per Week: 2 days    Minutes of Exercise per Session: 60 min  Stress: No Stress Concern Present (11/19/2022)   [employer] of Occupational Health - Occupational Stress Questionnaire    Feeling of Stress : Not at all  Social Connections: Moderately Integrated (11/19/2022)   Social Connection and Isolation Panel [NHANES]    Frequency of Communication with Friends and Family: More than three times a week    Frequency of Social Gatherings with Friends and Family: More than three times a week    Attends Religious Services: More than 4 times per year    Active Member of [employer] or Organizations: Yes    Attends Banker Meetings: More than 4 times per year    Marital Status: Widowed   Past Surgical History:  Procedure Laterality  Date   FEMUR IM NAIL Left 06/02/2020   Procedure: INTRAMEDULLARY (IM) RETROGRADE FEMORAL NAILING - LEFT;  Surgeon: Fidel Rogue, MD;  Location: [facility] OR;  Service: Orthopedics;  Laterality: Left;   STOMACH SURGERY     Past Surgical History:  Procedure Laterality Date   FEMUR IM NAIL Left 06/02/2020   Procedure: INTRAMEDULLARY (IM) RETROGRADE FEMORAL NAILING - LEFT;  Surgeon: Fidel Rogue, MD;  Location: [facility] OR;  Service: Orthopedics;  Laterality: Left;   STOMACH SURGERY     Past Medical History:  Diagnosis Date   Anxiety    Arthritis    Chronic insomnia 03/30/2015   Chronic low back pain 12/02/2016   Depression    GERD (gastroesophageal reflux disease)    Hiatal hernia    High cholesterol    Hypertelorism    Hypertension    Meningioma (HCC)    Migraine    Ht 5' 2 (1.575 m)   Wt 159 lb 3.2 oz  (72.2 kg)   BMI 29.12 kg/m   Opioid Risk Score:   Fall Risk Score:  `1  Depression screen [facility] 2/9     06/24/2023   11:05 AM 03/27/2023   10:59 AM 02/23/2023   11:07 AM 11/19/2022    2:37 PM 11/05/2022   10:07 AM 10/01/2022    2:54 PM 07/30/2022   11:19 AM  Depression screen PHQ 2/9  Decreased Interest 0 0 1 0 3 0 0  Down, Depressed, Hopeless 0 0 1 0 0 0 0  PHQ - 2 Score 0 0 2 0 3 0 0  Altered sleeping    0 3  0  Tired, decreased energy    0 3  0  Change in appetite    0 1  0  Feeling bad or failure about yourself     0 0  0  Trouble concentrating    0 0  0  Moving slowly or fidgety/restless    0 0  0  Suicidal thoughts    0 0  0  PHQ-9 Score    0 10  0  Difficult doing work/chores       Not difficult at all     Review of Systems  Musculoskeletal:  Positive for back pain and gait problem.  All other systems reviewed and are negative.     Objective:   Physical Exam Vitals and nursing note reviewed.  Constitutional:      Appearance: Normal appearance.  Cardiovascular:     Rate and Rhythm: Normal rate and regular rhythm.     Pulses: Normal pulses.     Heart sounds: Normal heart sounds.  Pulmonary:     Effort: Pulmonary effort is normal.     Breath sounds: Normal breath sounds.  Musculoskeletal:     Comments: Normal Muscle Bulk and Muscle Testing Reveals:  Upper Extremities: Full ROM and Muscle Strength 5/5 Lumbar Paraspinal Tenderness: L-4-L-5 Left Greater Trochanter Tenderness Lower Extremities: Full ROM and Muscle Strength 5/5 Arises from Table Slowly using walker for support Narrow Based  Gait     Skin:    General: Skin is warm and dry.  Neurological:     Mental Status: She is alert and oriented to person, place, and time.  Psychiatric:        Mood and Affect: Mood normal.        Behavior: Behavior normal.         Assessment & Plan:  Lumbar Radiculitis: S/P on 03/25/2023: Dr Eldonna : Lumbosacral  Transforaminal Epidural Steroid Injection - Sub-Pedicular  Approach with Fluoroscopic Guidance , with Good relief noted. 11/25/2023 2. Lumbar Facet Arthropathy: Continue HEP as tolerated. Continue current medication regimen. Continue to Monitor. 11/24/2022 3. Chronic Pain Syndrome: Refilled: Hydrocodone  5 mg 0.5-1 tablet twice a day as needed for pain #45. We will continue the opioid monitoring program, this consists of regular clinic visits, examinations, urine drug screen, pill counts as well as use of [place]  Controlled Substance Reporting system. A 12 month History has been reviewed on the [place]  Controlled Substance Reporting System on 11/25/2023 Continue current medication regimen.    F/U with 2 months

## 2023-11-30 ENCOUNTER — Ambulatory Visit: Payer: Medicare Other

## 2023-11-30 VITALS — Ht 62.0 in | Wt 159.0 lb

## 2023-11-30 DIAGNOSIS — Z0000 Encounter for general adult medical examination without abnormal findings: Secondary | ICD-10-CM

## 2023-11-30 NOTE — Patient Instructions (Signed)
 Ms. Avey , Thank you for taking time to come for your Medicare Wellness Visit. I appreciate your ongoing commitment to your health goals. Please review the following plan we discussed and let me know if I can assist you in the future.   Referrals/Orders/Follow-Ups/Clinician Recommendations:   This is a list of the screening recommended for you and due dates:  Health Maintenance  Topic Date Due   COVID-19 Vaccine (1) Never done   Zoster (Shingles) Vaccine (1 of 2) Never done   Pneumonia Vaccine (1 of 1 - PCV) Never done   DTaP/Tdap/Td vaccine (2 - Td or Tdap) 08/30/2024   Medicare Annual Wellness Visit  11/29/2024   Flu Shot  Completed   DEXA scan (bone density measurement)  Completed   HPV Vaccine  Aged Out   Opioid Pain Medicine Management Opioids are powerful medicines that are used to treat moderate to severe pain. When used for short periods of time, they can help you to: Sleep better. Do better in physical or occupational therapy. Feel better in the first few days after an injury. Recover from surgery. Opioids should be taken with the supervision of a trained health care provider. They should be taken for the shortest period of time possible. This is because opioids can be addictive, and the longer you take opioids, the greater your risk of addiction. This addiction can also be called opioid use disorder. What are the risks? Using opioid pain medicines for longer than 3 days increases your risk of side effects. Side effects include: Constipation. Nausea and vomiting. Breathing difficulties (respiratory depression). Drowsiness. Confusion. Opioid use disorder. Itching. Taking opioid pain medicine for a long period of time can affect your ability to do daily tasks. It also puts you at risk for: Motor vehicle crashes. Depression. Suicide. Heart attack. Overdose, which can be life-threatening. What is a pain treatment plan? A pain treatment plan is an agreement between you  and your health care provider. Pain is unique to each person, and treatments vary depending on your condition. To manage your pain, you and your health care provider need to work together. To help you do this: Discuss the goals of your treatment, including how much pain you might expect to have and how you will manage the pain. Review the risks and benefits of taking opioid medicines. Remember that a good treatment plan uses more than one approach and minimizes the chance of side effects. Be honest about the amount of medicines you take and about any drug or alcohol  use. Get pain medicine prescriptions from only one health care provider. Pain can be managed with many types of alternative treatments. Ask your health care provider to refer you to one or more specialists who can help you manage pain through: Physical or occupational therapy. Counseling (cognitive behavioral therapy). Good nutrition. Biofeedback. Massage. Meditation. Non-opioid medicine. Following a gentle exercise program. How to use opioid pain medicine Taking medicine Take your pain medicine exactly as told by your health care provider. Take it only when you need it. If your pain gets less severe, you may take less than your prescribed dose if your health care provider approves. If you are not having pain, do nottake pain medicine unless your health care provider tells you to take it. If your pain is severe, do nottry to treat it yourself by taking more pills than instructed on your prescription. Contact your health care provider for help. Write down the times when you take your pain medicine. It is easy  to become confused while on pain medicine. Writing the time can help you avoid overdose. Take other over-the-counter or prescription medicines only as told by your health care provider. Keeping yourself and others safe  While you are taking opioid pain medicine: Do not drive, use machinery, or power tools. Do not sign legal  documents. Do not drink alcohol . Do not take sleeping pills. Do not supervise children by yourself. Do not do activities that require climbing or being in high places. Do not go to a lake, river, ocean, spa, or swimming pool. Do not share your pain medicine with anyone. Keep pain medicine in a locked cabinet or in a secure area where pets and children cannot reach it. Stopping your use of opioids If you have been taking opioid medicine for more than a few weeks, you may need to slowly decrease (taper) how much you take until you stop completely. Tapering your use of opioids can decrease your risk of symptoms of withdrawal, such as: Pain and cramping in the abdomen. Nausea. Sweating. Sleepiness. Restlessness. Uncontrollable shaking (tremors). Cravings for the medicine. Do not attempt to taper your use of opioids on your own. Talk with your health care provider about how to do this. Your health care provider may prescribe a step-down schedule based on how much medicine you are taking and how long you have been taking it. Getting rid of leftover pills Do not save any leftover pills. Get rid of leftover pills safely by: Taking the medicine to a prescription take-back program. This is usually offered by the county or law enforcement. Bringing them to a pharmacy that has a drug disposal container. Flushing them down the toilet. Check the label or package insert of your medicine to see whether this is safe to do. Throwing them out in the trash. Check the label or package insert of your medicine to see whether this is safe to do. If it is safe to throw it out, remove the medicine from the original container, put it into a sealable bag or container, and mix it with used coffee grounds, food scraps, dirt, or cat litter before putting it in the trash. Follow these instructions at home: Activity Do exercises as told by your health care provider. Avoid activities that make your pain worse. Return to  your normal activities as told by your health care provider. Ask your health care provider what activities are safe for you. General instructions You may need to take these actions to prevent or treat constipation: Drink enough fluid to keep your urine pale yellow. Take over-the-counter or prescription medicines. Eat foods that are high in fiber, such as beans, whole grains, and fresh fruits and vegetables. Limit foods that are high in fat and processed sugars, such as fried or sweet foods. Keep all follow-up visits. This is important. Where to find support If you have been taking opioids for a long time, you may benefit from receiving support for quitting from a local support group or counselor. Ask your health care provider for a referral to these resources in your area. Where to find more information Centers for Disease Control and Prevention (CDC): FootballExhibition.com.br [place] Food and Drug Administration (FDA): PumpkinSearch.com.ee Get help right away if: You may have taken too much of an opioid (overdosed). Common symptoms of an overdose: Your breathing is slower or more shallow than normal. You have a very slow heartbeat (pulse). You have slurred speech. You have nausea and vomiting. Your pupils become very small. You have other potential  symptoms: You are very confused. You faint or feel like you will faint. You have cold, clammy skin. You have blue lips or fingernails. You have thoughts of harming yourself or harming others. These symptoms may represent a serious problem that is an emergency. Do not wait to see if the symptoms will go away. Get medical help right away. Call your local emergency services (911 in the [place] Do not drive yourself to the hospital.  If you ever feel like you may hurt yourself or others, or have thoughts about taking your own life, get help right away. Go to your nearest emergency department or: Call your local emergency services (911 in the [place] Call the [facility] (815 092 2798 in the [place] Call a suicide crisis helpline, such as the National Suicide Prevention Lifeline at 302-797-8572 or 988 in the [place] This is open 24 hours a day in the [place] If you're a Veteran: Call 988 and press 1. This is open 24 hours a day. Text the [employer] at (918)701-4534. Summary Opioid medicines can help you manage moderate to severe pain for a short period of time. A pain treatment plan is an agreement between you and your health care provider. Discuss the goals of your treatment, including how much pain you might expect to have and how you will manage the pain. If you think that you or someone else may have taken too much of an opioid, get medical help right away. This information is not intended to replace advice given to you by your health care provider. Make sure you discuss any questions you have with your health care provider. Document Revised: 07/13/2023 Document Reviewed: 01/16/2021 Elsevier Patient Education  2024 Elsevier Inc. Advanced directives: (In Chart) A copy of your advanced directives are scanned into your chart should your provider ever need it.  Next Medicare Annual Wellness Visit scheduled for next year: Yes

## 2023-11-30 NOTE — Progress Notes (Signed)
Subjective:   Tracey Armstrong is a 89 y.o. female who presents for Medicare Annual (Subsequent) preventive examination.  Visit Complete: Virtual I connected with  Tracey Armstrong on 11/30/23 by a audio enabled telemedicine application and verified that I am speaking with the correct person using two identifiers.  Patient Location: Home  Provider Location: Home Office  I discussed the limitations of evaluation and management by telemedicine. The patient expressed understanding and agreed to proceed.  Vital Signs: Because this visit was a virtual/telehealth visit, some criteria may be missing or patient reported. Any vitals not documented were not able to be obtained and vitals that have been documented are patient reported.    Cardiac Risk Factors include: advanced age (>59men, >14 women);hypertension     Objective:    Today's Vitals   11/30/23 1134  Weight: 159 lb (72.1 kg)  Height: 5\' 2"  (1.575 m)   Body mass index is 29.08 kg/m.     11/30/2023   11:43 AM 04/15/2023   10:00 AM 11/19/2022    2:42 PM 09/07/2022   10:19 AM 08/13/2022    9:41 AM 11/25/2021    3:22 PM 11/18/2021    2:50 PM  Advanced Directives  Does Patient Have a Medical Advance Directive? Yes No Yes No Yes Yes Yes  Type of Estate agent of [place];Living will  Healthcare Power of [place];Living will    Healthcare Power of Attorney  Does patient want to make changes to medical advance directive? No - Patient declined  No - Patient declined      Copy of Healthcare Power of Attorney in Chart? Yes - validated most recent copy scanned in chart (See row information)  Yes - validated most recent copy scanned in chart (See row information)    Yes - validated most recent copy scanned in chart (See row information)  Would patient like information on creating a medical advance directive?    No - Patient declined       Current Medications (verified) Outpatient Encounter Medications as of 11/30/2023   Medication Sig   ALPRAZolam (XANAX) 1 MG tablet Take 0.5 tablets (0.5 mg total) by mouth 2 (two) times daily as needed for anxiety.   ammonium lactate (LAC-HYDRIN) 12 % lotion Apply 1 Application topically daily as needed for dry skin (on heels).   antiseptic oral rinse (BIOTENE) LIQD Use 3 mls 3-4 times per day for dry mouth. Do not swallow.   Cholecalciferol (VITAMIN D3) 1.25 MG (50000 UT) CAPS TAKE 1 CAPSULE BY MOUTH EVERY 14 DAYS   Dextran 70-Hypromellose (ARTIFICIAL TEARS PF OP) Place 1-2 drops into both eyes See admin instructions. Instill 1-2 drops into both eyes at bedtime and an additional 2-3 times a day as needed for dryness   diclofenac (FLECTOR) 1.3 % PTCH Place 1 patch onto the skin 2 (two) times daily.   diphenhydramine-acetaminophen (TYLENOL PM) 25-500 MG TABS tablet Take 1.5 tablets by mouth at bedtime.   gabapentin (NEURONTIN) 300 MG capsule Take 2 capsules daily. If additional control needed, can increase to 3 capsules daily.   HYDROcodone-acetaminophen (NORCO) 5-325 MG tablet Take 0.5-1 tablets by mouth 2 (two) times daily as needed for severe pain (pain score 7-10).   levETIRAcetam (KEPPRA) 500 MG tablet Take 1 tablet twice a day   Magnesium 400 MG TABS Take 400 mg by mouth at bedtime.   MAXALT-MLT 10 MG disintegrating tablet DISSOLVE ONE TABLET BY MOUTH AS NEEDED HEADACHE.   Melatonin 10 MG TABS Take 10 mg  by mouth at bedtime as needed (for sleep).   omeprazole (PRILOSEC OTC) 20 MG tablet Take 20 mg by mouth daily before breakfast.   oxybutynin (DITROPAN-XL) 10 MG 24 hr tablet TAKE 1 TABLET(10 MG) BY MOUTH AT BEDTIME   POTASSIUM PO Take 1 tablet by mouth daily with lunch.   pravastatin (PRAVACHOL) 20 MG tablet TAKE 1 TABLET(20 MG) BY MOUTH 3 TIMES A WEEK   pyridOXINE (VITAMIN B-6) 100 MG tablet Take 400 mg by mouth daily with lunch.   riboflavin (VITAMIN B-2) 100 MG TABS tablet Take 400 mg by mouth daily with lunch.   tretinoin (RETIN-A) 0.1 % cream APPLY TO AFFECTED AREA  EVERY DAY AT BEDTIME   verapamil (VERELAN) 180 MG 24 hr capsule TAKE 1 CAPSULE(180 MG) BY MOUTH DAILY   No facility-administered encounter medications on file as of 11/30/2023.    Allergies (verified) Dihydrotachysterol, Trazodone and nefazodone, Valium [diazepam], Citalopram, and Other   History: Past Medical History:  Diagnosis Date   Anxiety    Arthritis    Chronic insomnia 03/30/2015   Chronic low back pain 12/02/2016   Depression    GERD (gastroesophageal reflux disease)    Hiatal hernia    High cholesterol    Hypertelorism    Hypertension    Meningioma (HCC)    Migraine    Past Surgical History:  Procedure Laterality Date   FEMUR IM NAIL Left 06/02/2020   Procedure: INTRAMEDULLARY (IM) RETROGRADE FEMORAL NAILING - LEFT;  Surgeon: Samson Frederic, MD;  Location: [facility] OR;  Service: Orthopedics;  Laterality: Left;   STOMACH SURGERY     Family History  Problem Relation Age of Onset   Heart Problems Mother    Migraines Father    Stroke Father    Epilepsy Brother    Cancer Brother 52       unknown type - metastatic   Social History   Socioeconomic History   Marital status: Widowed    Spouse name: Not on file   Number of children: 4   Years of education: 64   Highest education level: Not on file  Occupational History   Occupation: Retired  Tobacco Use   Smoking status: Never   Smokeless tobacco: Never  Vaping Use   Vaping status: Never Used  Substance and Sexual Activity   Alcohol use: No   Drug use: No   Sexual activity: Not Currently  Other Topics Concern   Not on file  Social History Narrative   Lives in [place] [facility]      Patient drinks 1 glass of tea daily.      Patient is right handed.   Social Drivers of Corporate investment banker Strain: Low Risk  (11/30/2023)   Overall Financial Resource Strain (CARDIA)    Difficulty of Paying Living Expenses: Not hard at all  Food Insecurity: No Food Insecurity (11/30/2023)   Hunger Vital Sign     Worried About Running Out of Food in the Last Year: Never true    Ran Out of Food in the Last Year: Never true  Transportation Needs: No Transportation Needs (11/30/2023)   PRAPARE - Administrator, Civil Service (Medical): No    Lack of Transportation (Non-Medical): No  Physical Activity: Insufficiently Active (11/30/2023)   Exercise Vital Sign    Days of Exercise per Week: 2 days    Minutes of Exercise per Session: 60 min  Stress: No Stress Concern Present (11/30/2023)   [employer] of Occupational  Health - Occupational Stress Questionnaire    Feeling of Stress : Not at all  Social Connections: Moderately Integrated (11/30/2023)   Social Connection and Isolation Panel [NHANES]    Frequency of Communication with Friends and Family: More than three times a week    Frequency of Social Gatherings with Friends and Family: More than three times a week    Attends Religious Services: More than 4 times per year    Active Member of [employer] or Organizations: Yes    Attends Banker Meetings: More than 4 times per year    Marital Status: Widowed    Tobacco Counseling Counseling given: Not Answered   Clinical Intake:  Pre-visit preparation completed: Yes  Pain : No/denies pain     BMI - recorded: 29.08 Nutritional Status: BMI 25 -29 Overweight Nutritional Risks: None Diabetes: No  How often do you need to have someone help you when you read instructions, pamphlets, or other written materials from your doctor or pharmacy?: 1 - Never  Interpreter Needed?: No  Information entered by :: Theresa Mulligan LPN   Activities of Daily Living    11/30/2023   11:41 AM  In your present state of health, do you have any difficulty performing the following activities:  Hearing? 0  Vision? 0  Difficulty concentrating or making decisions? 0  Walking or climbing stairs? 1  Comment Uses a Rollator Walker  Dressing or bathing? 0  Doing errands, shopping? 0  Preparing  Food and eating ? N  Using the Toilet? N  In the past six months, have you accidently leaked urine? Y  Comment Wears Depends. Followed by PCP  Do you have problems with loss of bowel control? N  Managing your Medications? N  Managing your Finances? N  Housekeeping or managing your Housekeeping? N    Patient Care Team: [place], [name], MD as PCP - General (Family Medicine) Van Clines, MD as Consulting Physician (Neurology) Tyrell Antonio, MD as Consulting Physician (Physical Medicine and Rehabilitation) Manning Charity, OD as Referring Physician (Optometry)  Indicate any recent Medical Services you may have received from other than [facility] providers in the past year (date may be approximate).     Assessment:   This is a routine wellness examination for Tracey Armstrong.  Hearing/Vision screen Hearing Screening - Comments:: Denies hearing difficulties   Vision Screening - Comments:: Wears rx glasses - up to date with routine eye exams with  Dr Emily Filbert   Goals Addressed               This Visit's Progress     Stay Active (pt-stated)         Depression Screen    11/30/2023   11:39 AM 06/24/2023   11:05 AM 03/27/2023   10:59 AM 02/23/2023   11:07 AM 11/19/2022    2:37 PM 11/05/2022   10:07 AM 10/01/2022    2:54 PM  PHQ 2/9 Scores  PHQ - 2 Score 0 0 0 2 0 3 0  PHQ- 9 Score     0 10     Fall Risk    11/30/2023   11:42 AM 11/25/2023   10:00 AM 06/24/2023   11:05 AM 05/27/2023   10:11 AM 04/15/2023   10:00 AM  Fall Risk   Falls in the past year? 0 0 0 0 0  Number falls in past yr: 0  0  0  Injury with Fall? 0  0  0  Risk for fall due to :  No Fall Risks      Follow up Falls prevention discussed;Falls evaluation completed    Falls evaluation completed    MEDICARE RISK AT HOME: Medicare Risk at Home Any stairs in or around the home?: Yes If so, are there any without handrails?: No Home free of loose throw rugs in walkways, pet beds, electrical cords, etc?: Yes Adequate lighting in your  home to reduce risk of falls?: Yes Life alert?: Yes Use of a cane, walker or w/c?: Yes Grab bars in the bathroom?: Yes Shower chair or bench in shower?: Yes Elevated toilet seat or a handicapped toilet?: Yes  TIMED UP AND GO:  Was the test performed?  No    Cognitive Function:    04/30/2021    9:00 AM 01/06/2018    9:46 AM  MMSE - Mini Mental State Exam  Not completed:  --  Orientation to time 5   Orientation to Place 5   Registration 3   Attention/ Calculation 5   Recall 2   Language- name 2 objects 2   Language- repeat 1   Language- follow 3 step command 3   Language- read & follow direction 1   Write a sentence 1   Copy design 1   Total score 29         11/30/2023   11:44 AM 11/19/2022    2:42 PM 11/18/2021    2:51 PM  6CIT Screen  What Year? 0 points 0 points 0 points  What month? 0 points 0 points 0 points  What time? 0 points 0 points 0 points  Count back from 20 0 points 0 points 0 points  Months in reverse 0 points 0 points 2 points  Repeat phrase 6 points 0 points 2 points  Total Score 6 points 0 points 4 points    Immunizations Immunization History  Administered Date(s) Administered   Influenza-Unspecified 08/31/2023   Tdap 08/30/2014    TDAP status: Up to date  Flu Vaccine status: Up to date  Pneumococcal vaccine status: Due, Education has been provided regarding the importance of this vaccine. Advised may receive this vaccine at local pharmacy or Health Dept. Aware to provide a copy of the vaccination record if obtained from local pharmacy or Health Dept. Verbalized acceptance and understanding.  Covid-19 vaccine status: Declined, Education has been provided regarding the importance of this vaccine but patient still declined. Advised may receive this vaccine at local pharmacy or Health Dept.or vaccine clinic. Aware to provide a copy of the vaccination record if obtained from local pharmacy or Health Dept. Verbalized acceptance and  understanding.  Qualifies for Shingles Vaccine? Yes   Zostavax completed No   Shingrix Completed?: No.    Education has been provided regarding the importance of this vaccine. Patient has been advised to call insurance company to determine out of pocket expense if they have not yet received this vaccine. Advised may also receive vaccine at local pharmacy or Health Dept. Verbalized acceptance and understanding.  Screening Tests Health Maintenance  Topic Date Due   COVID-19 Vaccine (1) Never done   Zoster Vaccines- Shingrix (1 of 2) Never done   Pneumonia Vaccine 78+ Years old (1 of 1 - PCV) Never done   DTaP/Tdap/Td (2 - Td or Tdap) 08/30/2024   Medicare Annual Wellness (AWV)  11/29/2024   INFLUENZA VACCINE  Completed   DEXA SCAN  Completed   HPV VACCINES  Aged Out    Health Maintenance  Health Maintenance Due  Topic Date  Due   COVID-19 Vaccine (1) Never done   Zoster Vaccines- Shingrix (1 of 2) Never done   Pneumonia Vaccine 48+ Years old (1 of 1 - PCV) Never done    Bone Density status: Completed 01/22/18. Results reflect: Bone density results: OSTEOPOROSIS. Repeat every   years.    Additional Screening:    Vision Screening: Recommended annual ophthalmology exams for early detection of glaucoma and other disorders of the eye. Is the patient up to date with their annual eye exam?  Yes  Who is the provider or what is the name of the office in which the patient attends annual eye exams? Dr Emily Filbert If pt is not established with a provider, would they like to be referred to a provider to establish care? No .   Dental Screening: Recommended annual dental exams for proper oral hygiene    Community Resource Referral / Chronic Care Management:  CRR required this visit?  No   CCM required this visit?  No     Plan:     I have personally reviewed and noted the following in the patient's chart:   Medical and social history Use of alcohol, tobacco or illicit drugs  Current  medications and supplements including opioid prescriptions. Patient is currently taking opioid prescriptions. Information provided to patient regarding non-opioid alternatives. Patient advised to discuss non-opioid treatment plan with their provider. Functional ability and status Nutritional status Physical activity Advanced directives List of other physicians Hospitalizations, surgeries, and ER visits in previous 12 months Vitals Screenings to include cognitive, depression, and falls Referrals and appointments  In addition, I have reviewed and discussed with patient certain preventive protocols, quality metrics, and best practice recommendations. A written personalized care plan for preventive services as well as general preventive health recommendations were provided to patient.     Tillie Rung, LPN   [id number]/61/0960   After Visit Summary: (MyChart) Due to this being a telephonic visit, the after visit summary with patients personalized plan was offered to patient via MyChart   Nurse Notes: None

## 2023-12-03 LAB — DRUG TOX MONITOR 1 W/CONF, ORAL FLD
Alprazolam: 0.82 ng/mL — ABNORMAL HIGH (ref <0.50)
Aminoclonazepam: NEGATIVE ng/mL (ref <0.50)
Amphetamines: NEGATIVE ng/mL (ref <10)
Barbiturates: NEGATIVE ng/mL (ref <10)
Benzodiazepines: POSITIVE ng/mL — AB (ref <0.50)
Buprenorphine: NEGATIVE ng/mL (ref <0.10)
Chlordiazepoxide: NEGATIVE ng/mL (ref <0.50)
Clonazepam: NEGATIVE ng/mL (ref <0.50)
Cocaine: NEGATIVE ng/mL (ref <5.0)
Diazepam: NEGATIVE ng/mL (ref <0.50)
Fentanyl: NEGATIVE ng/mL (ref <0.10)
Flunitrazepam: NEGATIVE ng/mL (ref <0.50)
Flurazepam: NEGATIVE ng/mL (ref <0.50)
Heroin Metabolite: NEGATIVE ng/mL (ref <1.0)
Lorazepam: NEGATIVE ng/mL (ref <0.50)
MARIJUANA: NEGATIVE ng/mL (ref <2.5)
MDMA: NEGATIVE ng/mL (ref <10)
Meprobamate: NEGATIVE ng/mL (ref <2.5)
Methadone: NEGATIVE ng/mL (ref <5.0)
Midazolam: NEGATIVE ng/mL (ref <0.50)
Nicotine Metabolite: NEGATIVE ng/mL (ref <5.0)
Nordiazepam: NEGATIVE ng/mL (ref <0.50)
Opiates: NEGATIVE ng/mL (ref <2.5)
Oxazepam: NEGATIVE ng/mL (ref <0.50)
Phencyclidine: NEGATIVE ng/mL (ref <10)
Tapentadol: NEGATIVE ng/mL (ref <5.0)
Temazepam: NEGATIVE ng/mL (ref <0.50)
Tramadol: NEGATIVE ng/mL (ref <5.0)
Triazolam: NEGATIVE ng/mL (ref <0.50)
Zolpidem: NEGATIVE ng/mL (ref <5.0)

## 2023-12-03 LAB — DRUG TOX ALC METAB W/CON, ORAL FLD: Alcohol Metabolite: NEGATIVE ng/mL (ref <25)

## 2023-12-08 ENCOUNTER — Other Ambulatory Visit: Payer: Self-pay | Admitting: Family Medicine

## 2023-12-08 DIAGNOSIS — F411 Generalized anxiety disorder: Secondary | ICD-10-CM

## 2023-12-09 ENCOUNTER — Other Ambulatory Visit: Payer: Self-pay | Admitting: Family Medicine

## 2023-12-09 DIAGNOSIS — F411 Generalized anxiety disorder: Secondary | ICD-10-CM

## 2023-12-14 NOTE — Addendum Note (Signed)
 Addended by: Kathreen Devoid on: 12/14/2023 08:36 AM   Modules accepted: Orders

## 2023-12-21 ENCOUNTER — Telehealth: Payer: Self-pay

## 2023-12-21 NOTE — Telephone Encounter (Signed)
 Pt had last injection on 12/23. Injection has lasted just over 2 months. It has helped 50%-75%. Same pain as last time, left lower back. Having "coldness" in lower legs. She has had one minor fall with no injuries. Current pain scale 7.

## 2023-12-22 ENCOUNTER — Other Ambulatory Visit: Payer: Self-pay | Admitting: Physical Medicine and Rehabilitation

## 2023-12-22 DIAGNOSIS — M48062 Spinal stenosis, lumbar region with neurogenic claudication: Secondary | ICD-10-CM

## 2023-12-22 DIAGNOSIS — M5416 Radiculopathy, lumbar region: Secondary | ICD-10-CM

## 2023-12-28 ENCOUNTER — Ambulatory Visit: Admitting: Physical Medicine and Rehabilitation

## 2023-12-28 ENCOUNTER — Other Ambulatory Visit: Payer: Self-pay

## 2023-12-28 VITALS — BP 118/77 | HR 99

## 2023-12-28 DIAGNOSIS — M5416 Radiculopathy, lumbar region: Secondary | ICD-10-CM | POA: Diagnosis not present

## 2023-12-28 MED ORDER — METHYLPREDNISOLONE ACETATE 40 MG/ML IJ SUSP
40.0000 mg | Freq: Once | INTRAMUSCULAR | Status: AC
  Administered 2023-12-28: 40 mg

## 2023-12-28 NOTE — Progress Notes (Signed)
 Pain Scale   Average Pain 7        +Driver, -BT, -Dye Allergies.

## 2023-12-28 NOTE — Procedures (Signed)
 Lumbosacral Transforaminal Epidural Steroid Injection - Sub-Pedicular Approach with Fluoroscopic Guidance  Patient: Tracey Armstrong      Date of Birth: 09-24-1935 MRN: [id number] PCP: [place], [name], MD      Visit Date: 12/28/2023   Universal Protocol:    Date/Time: 12/28/2023  Consent Given By: the patient  Position: PRONE  Additional Comments: Vital signs were monitored before and after the procedure. Patient was prepped and draped in the usual sterile fashion. The correct patient, procedure, and site was verified.   Injection Procedure Details:   Procedure diagnoses: Lumbar radiculopathy [M54.16]    Meds Administered:  Meds ordered this encounter  Medications   methylPREDNISolone acetate (DEPO-MEDROL) injection 40 mg    Laterality: Left  Location/Site: L4  Needle:5.0 in., 22 ga.  Short bevel or Quincke spinal needle  Needle Placement: Transforaminal  Findings:    -Comments: Excellent flow of contrast along the nerve, nerve root and into the epidural space.  Procedure Details: After squaring off the end-plates to get a true AP view, the C-arm was positioned so that an oblique view of the foramen as noted above was visualized. The target area is just inferior to the "nose of the scotty dog" or sub pedicular. The soft tissues overlying this structure were infiltrated with 2-3 ml. of 1% Lidocaine without Epinephrine.  The spinal needle was inserted toward the target using a "trajectory" view along the fluoroscope beam.  Under AP and lateral visualization, the needle was advanced so it did not puncture dura and was located close the 6 O'Clock position of the pedical in AP tracterory. Biplanar projections were used to confirm position. Aspiration was confirmed to be negative for CSF and/or blood. A 1-2 ml. volume of Isovue-250 was injected and flow of contrast was noted at each level. Radiographs were obtained for documentation purposes.   After attaining the desired flow of  contrast documented above, a 0.5 to 1.0 ml test dose of 0.25% Marcaine was injected into each respective transforaminal space.  The patient was observed for 90 seconds post injection.  After no sensory deficits were reported, and normal lower extremity motor function was noted,   the above injectate was administered so that equal amounts of the injectate were placed at each foramen (level) into the transforaminal epidural space.   Additional Comments:  The patient tolerated the procedure well Dressing: 2 x 2 sterile gauze and Band-Aid    Post-procedure details: Patient was observed during the procedure. Post-procedure instructions were reviewed.  Patient left the clinic in stable condition.

## 2023-12-28 NOTE — Patient Instructions (Signed)

## 2023-12-28 NOTE — Progress Notes (Signed)
 Tracey Armstrong - 89 y.o. female MRN [id number]  Date of birth: 06-28-1935  Office Visit Note: Visit Date: 12/28/2023 PCP: [place], [name], MD Referred by: [place], [name], MD  Subjective: Chief Complaint  Patient presents with   Lower Back - Pain   HPI:  Tracey Armstrong is a 89 y.o. female who comes in today for planned repeat Left L4-5  Lumbar Transforaminal epidural steroid injection with fluoroscopic guidance.  The patient has failed conservative care including home exercise, medications, time and activity modification.  This injection will be diagnostic and hopefully therapeutic.  Please see requesting physician notes for further details and justification. Patient received more than 50% pain relief from prior injection.   Referring: Ellin Goodie, FNP   ROS Otherwise per HPI.  Assessment & Plan: Visit Diagnoses:    ICD-10-CM   1. Lumbar radiculopathy  M54.16 XR C-ARM NO REPORT    Epidural Steroid injection    methylPREDNISolone acetate (DEPO-MEDROL) injection 40 mg      Plan: No additional findings.   Meds & Orders:  Meds ordered this encounter  Medications   methylPREDNISolone acetate (DEPO-MEDROL) injection 40 mg    Orders Placed This Encounter  Procedures   XR C-ARM NO REPORT   Epidural Steroid injection    Follow-up: Return for visit to requesting provider as needed.   Procedures: No procedures performed  Lumbosacral Transforaminal Epidural Steroid Injection - Sub-Pedicular Approach with Fluoroscopic Guidance  Patient: Tracey Armstrong      Date of Birth: 05/15/1935 MRN: [id number] PCP: [place], [name], MD      Visit Date: 12/28/2023   Universal Protocol:    Date/Time: 12/28/2023  Consent Given By: the patient  Position: PRONE  Additional Comments: Vital signs were monitored before and after the procedure. Patient was prepped and draped in the usual sterile fashion. The correct patient, procedure, and site was verified.   Injection Procedure Details:    Procedure diagnoses: Lumbar radiculopathy [M54.16]    Meds Administered:  Meds ordered this encounter  Medications   methylPREDNISolone acetate (DEPO-MEDROL) injection 40 mg    Laterality: Left  Location/Site: L4  Needle:5.0 in., 22 ga.  Short bevel or Quincke spinal needle  Needle Placement: Transforaminal  Findings:    -Comments: Excellent flow of contrast along the nerve, nerve root and into the epidural space.  Procedure Details: After squaring off the end-plates to get a true AP view, the C-arm was positioned so that an oblique view of the foramen as noted above was visualized. The target area is just inferior to the "nose of the scotty dog" or sub pedicular. The soft tissues overlying this structure were infiltrated with 2-3 ml. of 1% Lidocaine without Epinephrine.  The spinal needle was inserted toward the target using a "trajectory" view along the fluoroscope beam.  Under AP and lateral visualization, the needle was advanced so it did not puncture dura and was located close the 6 O'Clock position of the pedical in AP tracterory. Biplanar projections were used to confirm position. Aspiration was confirmed to be negative for CSF and/or blood. A 1-2 ml. volume of Isovue-250 was injected and flow of contrast was noted at each level. Radiographs were obtained for documentation purposes.   After attaining the desired flow of contrast documented above, a 0.5 to 1.0 ml test dose of 0.25% Marcaine was injected into each respective transforaminal space.  The patient was observed for 90 seconds post injection.  After no sensory deficits were reported, and normal lower extremity motor  function was noted,   the above injectate was administered so that equal amounts of the injectate were placed at each foramen (level) into the transforaminal epidural space.   Additional Comments:  The patient tolerated the procedure well Dressing: 2 x 2 sterile gauze and Band-Aid    Post-procedure  details: Patient was observed during the procedure. Post-procedure instructions were reviewed.  Patient left the clinic in stable condition.    Clinical History: CT LUMBAR SPINE WITHOUT CONTRAST   TECHNIQUE:  Multidetector CT imaging of the lumbar spine was performed without  intravenous contrast administration. Multiplanar CT image  reconstructions were also generated.   COMPARISON:  Lumbar spine radiographs 04/04/2020 and MRI 09/09/2018   FINDINGS:  Segmentation: 5 lumbar type vertebrae.   Alignment: Mild thoracolumbar levoscoliosis. Grade 1 anterolisthesis  of L4 on L5, unchanged from the prior MRI.   Vertebrae: L1 superior endplate compression fracture with 30%  vertebral body height loss and 4 mm retropulsion of the superior  endplate. A fracture line remains visible although there is  surrounding sclerosis. Hemangioma in the L4 vertebral body.   Paraspinal and other soft tissues: Aortic atherosclerosis without  aneurysm.   Disc levels: Disc bulging and posterior element hypertrophy result  in chronic mild-to-moderate spinal stenosis at L3-4 and moderate to  severe spinal stenosis at L4-5. No significant spinal stenosis  related to mild L1 retropulsion.   IMPRESSION:  1. Possibly subacute L1 compression fracture with 30% height loss  and 4 mm retropulsion. No associated spinal stenosis.  2. Chronic mild-to-moderate spinal stenosis at L3-4 and moderate to  severe spinal stenosis at L4-5.  3. Aortic Atherosclerosis (ICD10-I70.0).    Electronically Signed    By: Sebastian Ache M.D.    On: 04/04/2020 17:39     Objective:  VS:  HT:    WT:   BMI:     BP:118/77  HR:99bpm  TEMP: ( )  RESP:  Physical Exam Vitals and nursing note reviewed.  Constitutional:      General: She is not in acute distress.    Appearance: Normal appearance. She is not ill-appearing.  HENT:     Head: Normocephalic and atraumatic.     Right Ear: External ear normal.     Left Ear:  External ear normal.  Eyes:     Extraocular Movements: Extraocular movements intact.  Cardiovascular:     Rate and Rhythm: Normal rate.     Pulses: Normal pulses.  Pulmonary:     Effort: Pulmonary effort is normal. No respiratory distress.  Abdominal:     General: There is no distension.     Palpations: Abdomen is soft.  Musculoskeletal:        General: Tenderness present.     Cervical back: Neck supple.     Right lower leg: No edema.     Left lower leg: No edema.     Comments: Patient has good distal strength with no pain over the greater trochanters.  No clonus or focal weakness.  Skin:    Findings: No erythema, lesion or rash.  Neurological:     General: No focal deficit present.     Mental Status: She is alert and oriented to person, place, and time.     Sensory: No sensory deficit.     Motor: No weakness or abnormal muscle tone.     Coordination: Coordination normal.  Psychiatric:        Mood and Affect: Mood normal.        Behavior:  Behavior normal.      Imaging: No results found.

## 2023-12-31 ENCOUNTER — Encounter: Payer: Self-pay | Admitting: Physician Assistant

## 2023-12-31 ENCOUNTER — Ambulatory Visit: Admitting: Physician Assistant

## 2023-12-31 DIAGNOSIS — M7062 Trochanteric bursitis, left hip: Secondary | ICD-10-CM | POA: Diagnosis not present

## 2023-12-31 DIAGNOSIS — M1712 Unilateral primary osteoarthritis, left knee: Secondary | ICD-10-CM

## 2023-12-31 MED ORDER — METHYLPREDNISOLONE ACETATE 40 MG/ML IJ SUSP
40.0000 mg | INTRAMUSCULAR | Status: AC | PRN
  Administered 2023-12-31: 40 mg via INTRA_ARTICULAR

## 2023-12-31 MED ORDER — LIDOCAINE HCL 1 % IJ SOLN
3.0000 mL | INTRAMUSCULAR | Status: AC | PRN
  Administered 2023-12-31: 3 mL

## 2023-12-31 NOTE — Progress Notes (Signed)
 HPI: Tracey Armstrong comes in today requesting an injection in her left knee for arthritic pain and a left hip trochanteric injection for trochanteric bursitis.  She states the injection on 12-24 helped until just recently.  She has had no new injury.  She has had no fevers or chills. Review of system negative for fevers chills, Physical exam: General Well-developed well-nourished female no acute distress ambulates with a rollator.   Respirations: Unlabored Left knee: Good range of motion.  No abnormal warmth erythema or effusion.  Tenderness along the medial lateral joint line. Left hip: Good range of motion.  Tenderness over the trochanteric region.  No rashes skin lesion ulceration over the lateral aspect of the greater trochanteric region.  Impression: Left knee arthritis Left hip trochanteric bursitis   Plan she understands to wait at least 3 months between injections.  Questions were encouraged and answered at length.  She tolerated both injections well today.  Procedure Note  Patient: Tracey Armstrong             Date of Birth: January 03, 1935           MRN: [id number]             Visit Date: 12/31/2023  Procedures: Visit Diagnoses:  1. Unilateral primary osteoarthritis, left knee   2. Trochanteric bursitis, left hip     Large Joint Inj: L greater trochanter on 12/31/2023 11:40 AM Indications: pain Details: 22 G 1.5 in needle, lateral approach  Arthrogram: No  Medications: 3 mL lidocaine 1 %; 40 mg methylPREDNISolone acetate 40 MG/ML Outcome: tolerated well, no immediate complications Procedure, treatment alternatives, risks and benefits explained, specific risks discussed. Consent was given by the patient. Immediately prior to procedure a time out was called to verify the correct patient, procedure, equipment, support staff and site/side marked as required. Patient was prepped and draped in the usual sterile fashion.    Large Joint Inj: L knee on 12/31/2023 11:41 AM Indications:  pain Details: 22 G 1.5 in needle, anterolateral approach  Arthrogram: No  Medications: 3 mL lidocaine 1 %; 40 mg methylPREDNISolone acetate 40 MG/ML Outcome: tolerated well, no immediate complications Procedure, treatment alternatives, risks and benefits explained, specific risks discussed. Consent was given by the patient. Immediately prior to procedure a time out was called to verify the correct patient, procedure, equipment, support staff and site/side marked as required. Patient was prepped and draped in the usual sterile fashion.

## 2024-01-11 ENCOUNTER — Telehealth: Payer: Self-pay | Admitting: Registered Nurse

## 2024-01-11 NOTE — Telephone Encounter (Signed)
 Pt is having a lot of pain and really needs a refill until 4/2 she stated pharmacy said she couldn't but she really needs it ( hydrocodone)

## 2024-01-12 ENCOUNTER — Telehealth: Payer: Self-pay | Admitting: Registered Nurse

## 2024-01-12 MED ORDER — HYDROCODONE-ACETAMINOPHEN 5-325 MG PO TABS: 0.5000 | ORAL_TABLET | Freq: Two times a day (BID) | ORAL | 0 refills | Status: DC | PRN

## 2024-01-12 NOTE — Telephone Encounter (Signed)
 PMP was Reviewed.  Last Hydrocodone prescription was filled on 11/25/2023.  Hydrocodone prescription was sent to pharmacy.  Call placed to  [facility] pharmacy regarding the above, they will fill her prescription today.  Call placed Tracey Armstrong regarding the above, she verbalizes understanding. She has a scheduled appointment next week with this provider, she verbalizes understanding. ,

## 2024-01-20 ENCOUNTER — Encounter: Payer: Self-pay | Admitting: Registered Nurse

## 2024-01-20 ENCOUNTER — Encounter: Payer: Medicare Other | Attending: Physical Medicine and Rehabilitation | Admitting: Registered Nurse

## 2024-01-20 VITALS — BP 108/70 | HR 89 | Ht 62.0 in | Wt 155.0 lb

## 2024-01-20 DIAGNOSIS — Z79891 Long term (current) use of opiate analgesic: Secondary | ICD-10-CM | POA: Diagnosis not present

## 2024-01-20 DIAGNOSIS — M7062 Trochanteric bursitis, left hip: Secondary | ICD-10-CM | POA: Insufficient documentation

## 2024-01-20 DIAGNOSIS — G894 Chronic pain syndrome: Secondary | ICD-10-CM | POA: Diagnosis not present

## 2024-01-20 DIAGNOSIS — R5381 Other malaise: Secondary | ICD-10-CM | POA: Diagnosis not present

## 2024-01-20 DIAGNOSIS — M5416 Radiculopathy, lumbar region: Secondary | ICD-10-CM | POA: Diagnosis not present

## 2024-01-20 DIAGNOSIS — M47816 Spondylosis without myelopathy or radiculopathy, lumbar region: Secondary | ICD-10-CM | POA: Insufficient documentation

## 2024-01-20 DIAGNOSIS — Z5181 Encounter for therapeutic drug level monitoring: Secondary | ICD-10-CM | POA: Diagnosis not present

## 2024-01-20 MED ORDER — HYDROCODONE-ACETAMINOPHEN 5-325 MG PO TABS: 0.5000 | ORAL_TABLET | Freq: Two times a day (BID) | ORAL | 0 refills | Status: DC | PRN

## 2024-01-20 NOTE — Progress Notes (Signed)
 Subjective:    Patient ID: Tracey Armstrong, female    DOB: 1935-03-03, 89 y.o.   MRN: [id number]  HPI: Tracey Armstrong is a 89 y.o. female who returns for follow up appointment for chronic pain and medication refill. She states her pain is located in her lower back radiating into her left lower extremity and let hip. She rates her pain 8. Her current exercise regime is walking and performing stretching exercises.  Tracey Armstrong Morphine equivalent is 10.23 MME.  She is also prescribed Alprazolam  by Dr. [place] .We have discussed the black box warning of using opioids and benzodiazepines. I highlighted the dangers of using these drugs together and discussed the adverse events including respiratory suppression, overdose, cognitive impairment and importance of compliance with current regimen. We will continue to monitor and adjust as indicated.   Last Oral Swab was Performed on 11/25/2023, it was consistent for Alprazolam.    Pain Inventory Average Pain 10 Pain Right Now 8 My pain is aching  In the last 24 hours, has pain interfered with the following? General activity  . Relation with others  . Enjoyment of life  . What TIME of day is your pain at its worst? daytime Sleep (in general)  .  Pain is worse with:  lying Pain improves with: medication Relief from Meds:  .  Family History  Problem Relation Age of Onset   Heart Problems Mother    Migraines Father    Stroke Father    Epilepsy Brother    Cancer Brother 22       unknown type - metastatic   Social History   Socioeconomic History   Marital status: Widowed    Spouse name: Not on file   Number of children: 4   Years of education: 45   Highest education level: Not on file  Occupational History   Occupation: Retired  Tobacco Use   Smoking status: Never   Smokeless tobacco: Never  Vaping Use   Vaping status: Never Used  Substance and Sexual Activity   Alcohol use: No   Drug use: No   Sexual activity: Not Currently   Other Topics Concern   Not on file  Social History Narrative   Lives in [place] [facility]      Patient drinks 1 glass of tea daily.      Patient is right handed.   Social Drivers of Corporate investment banker Strain: Low Risk  (11/30/2023)   Overall Financial Resource Strain (CARDIA)    Difficulty of Paying Living Expenses: Not hard at all  Food Insecurity: No Food Insecurity (11/30/2023)   Hunger Vital Sign    Worried About Running Out of Food in the Last Year: Never true    Ran Out of Food in the Last Year: Never true  Transportation Needs: No Transportation Needs (11/30/2023)   PRAPARE - Administrator, Civil Service (Medical): No    Lack of Transportation (Non-Medical): No  Physical Activity: Insufficiently Active (11/30/2023)   Exercise Vital Sign    Days of Exercise per Week: 2 days    Minutes of Exercise per Session: 60 min  Stress: No Stress Concern Present (11/30/2023)   [employer] of Occupational Health - Occupational Stress Questionnaire    Feeling of Stress : Not at all  Social Connections: Moderately Integrated (11/30/2023)   Social Connection and Isolation Panel [NHANES]    Frequency of Communication with Friends and Family: More than three times a week  Frequency of Social Gatherings with Friends and Family: More than three times a week    Attends Religious Services: More than 4 times per year    Active Member of Clubs or Organizations: Yes    Attends Banker Meetings: More than 4 times per year    Marital Status: Widowed   Past Surgical History:  Procedure Laterality Date   FEMUR IM NAIL Left 06/02/2020   Procedure: INTRAMEDULLARY (IM) RETROGRADE FEMORAL NAILING - LEFT;  Surgeon: Samson Frederic, MD;  Location: [facility] OR;  Service: Orthopedics;  Laterality: Left;   STOMACH SURGERY     Past Surgical History:  Procedure Laterality Date   FEMUR IM NAIL Left 06/02/2020   Procedure: INTRAMEDULLARY (IM) RETROGRADE FEMORAL NAILING -  LEFT;  Surgeon: Samson Frederic, MD;  Location: [facility] OR;  Service: Orthopedics;  Laterality: Left;   STOMACH SURGERY     Past Medical History:  Diagnosis Date   Anxiety    Arthritis    Chronic insomnia 03/30/2015   Chronic low back pain 12/02/2016   Depression    GERD (gastroesophageal reflux disease)    Hiatal hernia    High cholesterol    Hypertelorism    Hypertension    Meningioma (HCC)    Migraine    BP 108/70   Pulse 89   Ht 5\' 2"  (1.575 m)   Wt 155 lb (70.3 kg)   SpO2 93%   BMI 28.35 kg/m   Opioid Risk Score:   Fall Risk Score:  `1  Depression screen [facility] 2/9     11/30/2023   11:39 AM 06/24/2023   11:05 AM 03/27/2023   10:59 AM 02/23/2023   11:07 AM 11/19/2022    2:37 PM 11/05/2022   10:07 AM 10/01/2022    2:54 PM  Depression screen PHQ 2/9  Decreased Interest 0 0 0 1 0 3 0  Down, Depressed, Hopeless 0 0 0 1 0 0 0  PHQ - 2 Score 0 0 0 2 0 3 0  Altered sleeping     0 3   Tired, decreased energy     0 3   Change in appetite     0 1   Feeling bad or failure about yourself      0 0   Trouble concentrating     0 0   Moving slowly or fidgety/restless     0 0   Suicidal thoughts     0 0   PHQ-9 Score     0 10       Review of Systems  Musculoskeletal:  Positive for back pain.       Pain in back of left knee  All other systems reviewed and are negative.     Objective:   Physical Exam        Assessment & Plan:  Lumbar Radiculitis: S/P on 03/25/2023: Dr Alvester Morin : Lumbosacral Transforaminal Epidural Steroid Injection - Sub-Pedicular Approach with Fluoroscopic Guidance , with Good relief noted. 01/20/2024 2. Lumbar Facet Arthropathy: Continue HEP as tolerated. Continue current medication regimen. Continue to Monitor. 01/20/2023 3. Chronic Pain Syndrome: Refilled: Hydrocodone 5 mg 0.5-1 tablet twice a day as needed for pain #45. We will continue the opioid monitoring program, this consists of regular clinic visits, examinations, urine drug screen, pill counts as well  as use of [place] Controlled Substance Reporting system. A 12 month History has been reviewed on the [place] Controlled Substance Reporting System on 01/20/2024 Continue current medication regimen.  4. Physical Deconditioning: RX: Physical Therapy.    F/U with 2 months

## 2024-01-27 DIAGNOSIS — R5381 Other malaise: Secondary | ICD-10-CM | POA: Diagnosis not present

## 2024-01-27 DIAGNOSIS — M25562 Pain in left knee: Secondary | ICD-10-CM | POA: Diagnosis not present

## 2024-01-27 DIAGNOSIS — M5416 Radiculopathy, lumbar region: Secondary | ICD-10-CM | POA: Diagnosis not present

## 2024-01-27 DIAGNOSIS — M5459 Other low back pain: Secondary | ICD-10-CM | POA: Diagnosis not present

## 2024-01-27 DIAGNOSIS — R2689 Other abnormalities of gait and mobility: Secondary | ICD-10-CM | POA: Diagnosis not present

## 2024-02-01 DIAGNOSIS — R2689 Other abnormalities of gait and mobility: Secondary | ICD-10-CM | POA: Diagnosis not present

## 2024-02-01 DIAGNOSIS — M5416 Radiculopathy, lumbar region: Secondary | ICD-10-CM | POA: Diagnosis not present

## 2024-02-01 DIAGNOSIS — M5459 Other low back pain: Secondary | ICD-10-CM | POA: Diagnosis not present

## 2024-02-01 DIAGNOSIS — M25562 Pain in left knee: Secondary | ICD-10-CM | POA: Diagnosis not present

## 2024-02-01 DIAGNOSIS — R5381 Other malaise: Secondary | ICD-10-CM | POA: Diagnosis not present

## 2024-02-03 DIAGNOSIS — R2689 Other abnormalities of gait and mobility: Secondary | ICD-10-CM | POA: Diagnosis not present

## 2024-02-03 DIAGNOSIS — M25562 Pain in left knee: Secondary | ICD-10-CM | POA: Diagnosis not present

## 2024-02-03 DIAGNOSIS — M5416 Radiculopathy, lumbar region: Secondary | ICD-10-CM | POA: Diagnosis not present

## 2024-02-03 DIAGNOSIS — M5459 Other low back pain: Secondary | ICD-10-CM | POA: Diagnosis not present

## 2024-02-03 DIAGNOSIS — R5381 Other malaise: Secondary | ICD-10-CM | POA: Diagnosis not present

## 2024-02-05 ENCOUNTER — Ambulatory Visit: Payer: Self-pay

## 2024-02-05 DIAGNOSIS — N3000 Acute cystitis without hematuria: Secondary | ICD-10-CM | POA: Diagnosis not present

## 2024-02-05 NOTE — Telephone Encounter (Signed)
  Chief Complaint: urinary issues Symptoms: pressure, urgency, frequency, burning   Disposition: [] ED /[x] Urgent Care (no appt availability in office) / [] Appointment(In office/virtual)/ []  [place] [facility]/ [] Home Care/ [] Refused Recommended Disposition /[] [place] [facility]/ []  Follow-up with PCP Additional Notes: Pt's son, Quint<  calling with concerns possible UTI. Pt stated with burning, increase in frequency/urgency, and pressure in bladder area 3 days ago. Pt's aids have used UTI test strips twice and both have been positive. Quit is asking if antibiotic could be called in. He stated " getting her together, leaving the  assisted living has become stressful on mom." RN explained offices are closed today and to seek care at urgent care this evening. RN explained with these symptoms this can't wait until Monday. Quint verbalized understanding and stated he would take her this evening. RN also suggested he call for follow up appt with pcp next week since UTI tend to be "reoccurring often."RN gave care advice and pt verbalized understanding.            Copied from CRM [id number]. Topic: Clinical - Red Word Triage >> Feb 05, 2024  4:18 PM Abigail D wrote: Red Word that prompted transfer to Nurse Triage: Positive UTI test strip, burning while urinating, pressure in bladder. Building up over the last several days. Requesting antibiotic. Reason for Disposition  Side (flank) or lower back pain present  Answer Assessment - Initial Assessment Questions 1. SYMPTOM: "What's the main symptom you're concerned about?" (e.g., frequency, incontinence)     Retention, burning, pressure 2. ONSET: "When did the    start?"     3 days ago 3. PAIN: "Is there any pain?" If Yes, ask: "How bad is it?" (Scale: 1-10; mild, moderate, severe)     Not sure 4. CAUSE: "What do you think is causing the symptoms?"     uti 5. OTHER SYMPTOMS: "Do you have any other symptoms?" (e.g., blood in urine, fever,  flank pain, pain with urination)     Pressure, urgency/frequency increased  Protocols used: Urinary Symptoms-A-AH

## 2024-02-08 DIAGNOSIS — M25562 Pain in left knee: Secondary | ICD-10-CM | POA: Diagnosis not present

## 2024-02-08 DIAGNOSIS — M5416 Radiculopathy, lumbar region: Secondary | ICD-10-CM | POA: Diagnosis not present

## 2024-02-08 DIAGNOSIS — M5459 Other low back pain: Secondary | ICD-10-CM | POA: Diagnosis not present

## 2024-02-08 DIAGNOSIS — R2689 Other abnormalities of gait and mobility: Secondary | ICD-10-CM | POA: Diagnosis not present

## 2024-02-08 DIAGNOSIS — R5381 Other malaise: Secondary | ICD-10-CM | POA: Diagnosis not present

## 2024-02-11 DIAGNOSIS — M545 Low back pain, unspecified: Secondary | ICD-10-CM | POA: Diagnosis not present

## 2024-02-11 DIAGNOSIS — R300 Dysuria: Secondary | ICD-10-CM | POA: Diagnosis not present

## 2024-02-17 DIAGNOSIS — M25562 Pain in left knee: Secondary | ICD-10-CM | POA: Diagnosis not present

## 2024-02-17 DIAGNOSIS — M5416 Radiculopathy, lumbar region: Secondary | ICD-10-CM | POA: Diagnosis not present

## 2024-02-17 DIAGNOSIS — R2689 Other abnormalities of gait and mobility: Secondary | ICD-10-CM | POA: Diagnosis not present

## 2024-02-17 DIAGNOSIS — M5459 Other low back pain: Secondary | ICD-10-CM | POA: Diagnosis not present

## 2024-02-17 DIAGNOSIS — R5381 Other malaise: Secondary | ICD-10-CM | POA: Diagnosis not present

## 2024-03-10 ENCOUNTER — Telehealth: Payer: Self-pay | Admitting: Registered Nurse

## 2024-03-10 DIAGNOSIS — M47816 Spondylosis without myelopathy or radiculopathy, lumbar region: Secondary | ICD-10-CM

## 2024-03-10 DIAGNOSIS — G894 Chronic pain syndrome: Secondary | ICD-10-CM

## 2024-03-10 NOTE — Telephone Encounter (Signed)
 Niece said phone call can be rt to the patient

## 2024-03-10 NOTE — Telephone Encounter (Signed)
 Ps niece rhonda called and stated pt is in a lot of pain and nothing is working. Pt is wanting to know if a mri could be ordered so they can figure out what's going on

## 2024-03-11 MED ORDER — HYDROCODONE-ACETAMINOPHEN 5-325 MG PO TABS: 0.5000 | ORAL_TABLET | Freq: Two times a day (BID) | ORAL | 0 refills | Status: DC | PRN

## 2024-03-11 NOTE — Telephone Encounter (Signed)
 PMP was  Reviewed.  Ms. Mutz was called, she reports increase intensity of hip pain, she's takin her Hydrocodone  1 tablet twice a day as needed for pain. Ortho following. She has an appointment on 03/30/2024.  Hydrocodone  e-scribed to pharmacy, Ms. Cousin verbalizes understanding.

## 2024-03-18 ENCOUNTER — Telehealth: Payer: Self-pay | Admitting: Physical Medicine and Rehabilitation

## 2024-03-18 NOTE — Telephone Encounter (Signed)
 Patient called and want to make an appointment for the injection in the lower back. CB#[phone]

## 2024-03-28 ENCOUNTER — Ambulatory Visit: Payer: Medicare Other | Admitting: Neurology

## 2024-03-28 ENCOUNTER — Encounter: Payer: Self-pay | Admitting: Physician Assistant

## 2024-03-28 ENCOUNTER — Other Ambulatory Visit: Payer: Self-pay | Admitting: Physical Medicine and Rehabilitation

## 2024-03-28 ENCOUNTER — Ambulatory Visit (INDEPENDENT_AMBULATORY_CARE_PROVIDER_SITE_OTHER): Admitting: Physician Assistant

## 2024-03-28 DIAGNOSIS — M1712 Unilateral primary osteoarthritis, left knee: Secondary | ICD-10-CM | POA: Diagnosis not present

## 2024-03-28 MED ORDER — LIDOCAINE HCL 1 % IJ SOLN
3.0000 mL | INTRAMUSCULAR | Status: AC | PRN
  Administered 2024-03-28: 3 mL

## 2024-03-28 MED ORDER — METHYLPREDNISOLONE ACETATE 40 MG/ML IJ SUSP
40.0000 mg | INTRAMUSCULAR | Status: AC | PRN
Start: 2024-03-28 — End: 2024-03-28
  Administered 2024-03-28: 40 mg via INTRA_ARTICULAR

## 2024-03-28 NOTE — Progress Notes (Signed)
   Procedure Note  Patient: Tracey Armstrong             Date of Birth: 11-02-1934           MRN: [id number]             Visit Date: 03/28/2024 HPI: Tracey Armstrong comes in today requesting a left knee injection.  The last injection was 12/31/2023.  States that this injection was helpful.  She has had no new injury to the knee.  She has known arthritis in the left knee.  Denies fevers chills.  Physical exam: Left knee slight valgus deformity.  Significant patellofemoral crepitus.  No gross instability valgus varus stressing.  Overall good range of motion of the knee.  No abnormal warmth erythema or effusion. Procedures: Visit Diagnoses:  1. Unilateral primary osteoarthritis, left knee     Large Joint Inj: L knee on 03/28/2024 10:25 AM Indications: pain Details: 22 G 1.5 in needle, anterolateral approach  Arthrogram: No  Medications: 3 mL lidocaine  1 %; 40 mg methylPREDNISolone  acetate 40 MG/ML Outcome: tolerated well, no immediate complications Procedure, treatment alternatives, risks and benefits explained, specific risks discussed. Consent was given by the patient. Immediately prior to procedure a time out was called to verify the correct patient, procedure, equipment, support staff and site/side marked as required. Patient was prepped and draped in the usual sterile fashion.     Plan: She will follow-up with us  as needed needs to wait at least 3 months between injections.  She will work on Dance movement psychotherapist.

## 2024-03-30 ENCOUNTER — Encounter: Payer: Self-pay | Admitting: Registered Nurse

## 2024-03-30 ENCOUNTER — Encounter: Attending: Physical Medicine and Rehabilitation | Admitting: Registered Nurse

## 2024-03-30 ENCOUNTER — Other Ambulatory Visit: Payer: Self-pay | Admitting: Family Medicine

## 2024-03-30 VITALS — BP 125/84 | HR 76 | Ht 62.0 in | Wt 150.6 lb

## 2024-03-30 DIAGNOSIS — I700 Atherosclerosis of aorta: Secondary | ICD-10-CM

## 2024-03-30 DIAGNOSIS — Z5181 Encounter for therapeutic drug level monitoring: Secondary | ICD-10-CM | POA: Diagnosis not present

## 2024-03-30 DIAGNOSIS — M7062 Trochanteric bursitis, left hip: Secondary | ICD-10-CM | POA: Diagnosis not present

## 2024-03-30 DIAGNOSIS — Z79899 Other long term (current) drug therapy: Secondary | ICD-10-CM | POA: Insufficient documentation

## 2024-03-30 DIAGNOSIS — G894 Chronic pain syndrome: Secondary | ICD-10-CM | POA: Insufficient documentation

## 2024-03-30 DIAGNOSIS — Z79891 Long term (current) use of opiate analgesic: Secondary | ICD-10-CM

## 2024-03-30 DIAGNOSIS — M47816 Spondylosis without myelopathy or radiculopathy, lumbar region: Secondary | ICD-10-CM | POA: Diagnosis not present

## 2024-03-30 MED ORDER — HYDROCODONE-ACETAMINOPHEN 5-325 MG PO TABS: 0.5000 | ORAL_TABLET | Freq: Two times a day (BID) | ORAL | 0 refills | Status: DC | PRN

## 2024-03-30 NOTE — Progress Notes (Signed)
 Subjective:    Patient ID: Tracey Armstrong, female    DOB: May 05, 1935, 89 y.o.   MRN: [id number]  HPI: Tracey Armstrong is a 89 y.o. female who returns for follow up appointment for chronic pain and medication refill. She states her pain is located in her lower back and left hip. She rates her pain 10. Her current exercise regime is walking  with her walker.  Oral Swab was Performed today.  Tracey Armstrong Morphine  equivalent is 10.23 MME.      Pain Inventory Average Pain 6 Pain Right Now 10 My pain is aching  In the last 24 hours, has pain interfered with the following? General activity 6 Relation with others 3 Enjoyment of life 3 What TIME of day is your pain at its worst? daytime Sleep (in general) Fair  Pain is worse with: bending and standing Pain improves with: rest, heat/ice, medication, and injections Relief from Meds: 7  Family History  Problem Relation Age of Onset   Heart Problems Mother    Migraines Father    Stroke Father    Epilepsy Brother    Cancer Brother 61       unknown type - metastatic   Social History   Socioeconomic History   Marital status: Widowed    Spouse name: Not on file   Number of children: 4   Years of education: 4   Highest education level: Not on file  Occupational History   Occupation: Retired  Tobacco Use   Smoking status: Never   Smokeless tobacco: Never  Vaping Use   Vaping status: Never Used  Substance and Sexual Activity   Alcohol  use: No   Drug use: No   Sexual activity: Not Currently  Other Topics Concern   Not on file  Social History Narrative   Lives in [facility]      Patient drinks 1 glass of tea daily.      Patient is right handed.   Social Drivers of Corporate investment banker Strain: Low Risk  (11/30/2023)   Overall Financial Resource Strain (CARDIA)    Difficulty of Paying Living Expenses: Not hard at all  Food Insecurity: No Food Insecurity (11/30/2023)   Hunger Vital Sign    Worried About Running  Out of Food in the Last Year: Never true    Ran Out of Food in the Last Year: Never true  Transportation Needs: No Transportation Needs (11/30/2023)   PRAPARE - Administrator, Civil Service (Medical): No    Lack of Transportation (Non-Medical): No  Physical Activity: Insufficiently Active (11/30/2023)   Exercise Vital Sign    Days of Exercise per Week: 2 days    Minutes of Exercise per Session: 60 min  Stress: No Stress Concern Present (11/30/2023)   [employer] of Occupational Health - Occupational Stress Questionnaire    Feeling of Stress : Not at all  Social Connections: Moderately Integrated (11/30/2023)   Social Connection and Isolation Panel [NHANES]    Frequency of Communication with Friends and Family: More than three times a week    Frequency of Social Gatherings with Friends and Family: More than three times a week    Attends Religious Services: More than 4 times per year    Active Member of [employer] or Organizations: Yes    Attends Banker Meetings: More than 4 times per year    Marital Status: Widowed   Past Surgical History:  Procedure Laterality Date  FEMUR IM NAIL Left 06/02/2020   Procedure: INTRAMEDULLARY (IM) RETROGRADE FEMORAL NAILING - LEFT;  Surgeon: Fidel Rogue, MD;  Location: [facility] OR;  Service: Orthopedics;  Laterality: Left;   STOMACH SURGERY     Past Surgical History:  Procedure Laterality Date   FEMUR IM NAIL Left 06/02/2020   Procedure: INTRAMEDULLARY (IM) RETROGRADE FEMORAL NAILING - LEFT;  Surgeon: Fidel Rogue, MD;  Location: [facility] OR;  Service: Orthopedics;  Laterality: Left;   STOMACH SURGERY     Past Medical History:  Diagnosis Date   Anxiety    Arthritis    Chronic insomnia 03/30/2015   Chronic low back pain 12/02/2016   Depression    GERD (gastroesophageal reflux disease)    Hiatal hernia    High cholesterol    Hypertelorism    Hypertension    Meningioma (HCC)    Migraine    BP 125/84 (BP Location: Left Arm,  Patient Position: Sitting, Cuff Size: Normal)   Pulse 76   Ht 5' 2 (1.575 m)   Wt 150 lb 9.6 oz (68.3 kg)   SpO2 98%   BMI 27.55 kg/m   Opioid Risk Score:   Fall Risk Score:  `1  Depression screen [facility] 2/9     03/30/2024   10:17 AM 11/30/2023   11:39 AM 06/24/2023   11:05 AM 03/27/2023   10:59 AM 02/23/2023   11:07 AM 11/19/2022    2:37 PM 11/05/2022   10:07 AM  Depression screen PHQ 2/9  Decreased Interest 0 0 0 0 1 0 3  Down, Depressed, Hopeless 0 0 0 0 1 0 0  PHQ - 2 Score 0 0 0 0 2 0 3  Altered sleeping 0     0 3  Tired, decreased energy 0     0 3  Change in appetite 0     0 1  Feeling bad or failure about yourself  0     0 0  Trouble concentrating 0     0 0  Moving slowly or fidgety/restless 0     0 0  Suicidal thoughts 0     0 0  PHQ-9 Score 0     0 10       Review of Systems  Musculoskeletal:  Positive for back pain, joint swelling and myalgias.       Lower back pain, left hip pain radiating into femur   All other systems reviewed and are negative.      Objective:   Physical Exam Vitals and nursing note reviewed.  Constitutional:      Appearance: Normal appearance.   Cardiovascular:     Rate and Rhythm: Normal rate and regular rhythm.     Pulses: Normal pulses.     Heart sounds: Normal heart sounds.  Pulmonary:     Effort: Pulmonary effort is normal.     Breath sounds: Normal breath sounds.   Musculoskeletal:     Comments: Normal Muscle Bulk and Muscle Testing Reveals:  Upper Extremities: Full ROM and Muscle Strength 5/5 Lumbar Paraspinal Tenderness: L-4-L-5 Lower Extremities: Right: Full ROM and Muscle Strength 5/5 Left Lower Extremity: Decreased ROM and Muscle Strength 5/5 Bilateral Lower Extremity flexion Produces Pain into her Lumbar, Let Hip and Left Lower Extremity Arises from Table slowly using walker for support Antalgic  Gait      Skin:    General: Skin is warm and dry.   Neurological:     Mental Status: She is alert and oriented to  person, place, and time.   Psychiatric:        Mood and Affect: Mood normal.        Behavior: Behavior normal.         Assessment & Plan:  Lumbar Radiculitis: S/P on 03/25/2023: Dr Eldonna : Lumbosacral Transforaminal Epidural Steroid Injection - Sub-Pedicular Approach with Fluoroscopic Guidance , with Good relief noted. 03/30/2024 2. Lumbar Facet Arthropathy: Continue HEP as tolerated. Continue current medication regimen. Continue to Monitor. 03/30/2024 3. Chronic Pain Syndrome: Refilled: Hydrocodone  5 mg 0.5-1 tablet twice a day as needed for pain #45. We will continue the opioid monitoring program, this consists of regular clinic visits, examinations, urine drug screen, pill counts as well as use of [place]  Controlled Substance Reporting system. A 12 month History has been reviewed on the [place]  Controlled Substance Reporting System on 03/30/2024 Continue current medication regimen.  4. Physical Deconditioning: Continue : Physical Therapy. Continue to Monitor.    F/U with 2 months

## 2024-04-01 ENCOUNTER — Ambulatory Visit (INDEPENDENT_AMBULATORY_CARE_PROVIDER_SITE_OTHER): Admitting: Physical Medicine and Rehabilitation

## 2024-04-01 ENCOUNTER — Encounter: Payer: Self-pay | Admitting: Physical Medicine and Rehabilitation

## 2024-04-01 DIAGNOSIS — M5416 Radiculopathy, lumbar region: Secondary | ICD-10-CM

## 2024-04-01 DIAGNOSIS — G8929 Other chronic pain: Secondary | ICD-10-CM

## 2024-04-01 DIAGNOSIS — M48062 Spinal stenosis, lumbar region with neurogenic claudication: Secondary | ICD-10-CM | POA: Diagnosis not present

## 2024-04-01 DIAGNOSIS — M5442 Lumbago with sciatica, left side: Secondary | ICD-10-CM | POA: Diagnosis not present

## 2024-04-01 NOTE — Progress Notes (Unsigned)
 Tracey Armstrong - 89 y.o. female MRN [id number]  Date of birth: 23-Feb-1935  Office Visit Note: Visit Date: 04/01/2024 PCP: [place], [name], MD Referred by: [place], [name], MD  Subjective: Chief Complaint  Patient presents with   Lower Back - Follow-up   HPI: Tracey Armstrong is a 89 y.o. female who comes in today for evaluation of chronic, worsening and severe left sided lower back pain radiating down leg. Patient is well known to us . Patient is somewhat of a poor historian. Pain ongoing for several years. No specific aggravating factors noted. States her pain worsens with standing, walking and activity. She describes her pain as sharp and sore, currently rates as 8 out of 10. Patient currently resides at [facility]. Some relief of pain with home exercise regimen, rest and use of medications. She is undergoing chronic pain management with Ford Ide, NP at [facility]. She is currently prescribed Norco. Does attend physical therapy treatments at facility, usually twice a week, some relief of pain with these treatments. CT imaging of lumbar spine from 2021 exhibits moderate to severe spinal canal stenosis at L4-L5 and L1 superior endplate compression fracture with 30% vertebral body height. More recent CT imaging of lumbar spine completed in the emergency department states unchanged mild spinal canal stenosis at L4-L5 and progression of height loss at L1 vertebral fracture now with greater than 75% central height loss and 5 mm retropulsion of the posterosuperior corner. She has undergone multiple lumbar epidural steroid injections in our office. Most recent was left L4 transforaminal epidural steroid injection on 12/28/2023, she reports greater than 80% relief of pain for 3 months with this procedure. Patient ambulates with rolling walker. Patient denies focal weakness, numbness and tingling. Patient denies recent trauma or falls.      Review of Systems   Musculoskeletal:  Positive for back pain, joint pain and myalgias.  Neurological:  Negative for tingling, sensory change, focal weakness and weakness.  All other systems reviewed and are negative.  Otherwise per HPI.  Assessment & Plan: Visit Diagnoses:    ICD-10-CM   1. Lumbar radiculopathy  M54.16     2. Chronic left-sided low back pain with left-sided sciatica  M54.42    G89.29     3. Spinal stenosis of lumbar region with neurogenic claudication  M48.062        Plan: Findings:  Chronic, worsening and severe left sided lower back pain radiating down left leg. She continues to have severe pain despite good conservative therapies such as formal physical therapy, home exercise regimen, rest and use of medications. Patients clinical presentation and exam are consistent with neurogenic claudication as a result of spinal canal stenosis. There is moderate to severe spinal canal stenosis at the level of L4-L5 on CT imaging of lumbar spine from 2021. Good relief of pain with left L4 transforaminal epidural steroid injection in March. Next step is to perform diagnostic and hopefully therapeutic left L4 transforaminal epidural steroid injection under fluoroscopic guidance. I instructed her to continue chronic pain management with Ford Ide, NP. She has no questions at this time. No red flag symptoms noted upon exam today.     Meds & Orders: No orders of the defined types were placed in this encounter.  No orders of the defined types were placed in this encounter.   Follow-up: Return for Left L4 transforaminal epidural steroid injection.   Procedures: No procedures performed      Clinical History:  CT LUMBAR SPINE WITHOUT CONTRAST   TECHNIQUE:  Multidetector CT imaging of the lumbar spine was performed without  intravenous contrast administration. Multiplanar CT image  reconstructions were also generated.   COMPARISON:  Lumbar spine radiographs 04/04/2020 and MRI 09/09/2018    FINDINGS:  Segmentation: 5 lumbar type vertebrae.   Alignment: Mild thoracolumbar levoscoliosis. Grade 1 anterolisthesis  of L4 on L5, unchanged from the prior MRI.   Vertebrae: L1 superior endplate compression fracture with 30%  vertebral body height loss and 4 mm retropulsion of the superior  endplate. A fracture line remains visible although there is  surrounding sclerosis. Hemangioma in the L4 vertebral body.   Paraspinal and other soft tissues: Aortic atherosclerosis without  aneurysm.   Disc levels: Disc bulging and posterior element hypertrophy result  in chronic mild-to-moderate spinal stenosis at L3-4 and moderate to  severe spinal stenosis at L4-5. No significant spinal stenosis  related to mild L1 retropulsion.   IMPRESSION:  1. Possibly subacute L1 compression fracture with 30% height loss  and 4 mm retropulsion. No associated spinal stenosis.  2. Chronic mild-to-moderate spinal stenosis at L3-4 and moderate to  severe spinal stenosis at L4-5.  3. Aortic Atherosclerosis (ICD10-I70.0).    Electronically Signed    By: Aundra Lee M.D.    On: 04/04/2020 17:39   She reports that she has never smoked. She has never used smokeless tobacco. No results for input(s): HGBA1C, LABURIC in the last 8760 hours.  Objective:  VS:  HT:    WT:   BMI:     BP:   HR: bpm  TEMP: ( )  RESP:  Physical Exam Vitals and nursing note reviewed.  HENT:     Head: Normocephalic and atraumatic.     Right Ear: External ear normal.     Left Ear: External ear normal.     Nose: Nose normal.     Mouth/Throat:     Mouth: Mucous membranes are moist.   Eyes:     Extraocular Movements: Extraocular movements intact.    Cardiovascular:     Rate and Rhythm: Normal rate.     Pulses: Normal pulses.  Pulmonary:     Effort: Pulmonary effort is normal.  Abdominal:     General: Abdomen is flat. There is no distension.   Musculoskeletal:        General: Tenderness present.      Cervical back: Normal range of motion.     Comments: Patient is slow to rise from seated position to standing. Good lumbar range of motion. No pain noted with facet loading. 5/5 strength noted with bilateral hip flexion, knee flexion/extension, ankle dorsiflexion/plantarflexion and EHL. No clonus noted bilaterally. No pain upon palpation of greater trochanters. No pain with internal/external rotation of bilateral hips. Sensation intact bilaterally. Negative slump test bilaterally. Ambulates with rolling walker, gait slow and unsteady.    Skin:    General: Skin is warm and dry.     Capillary Refill: Capillary refill takes less than 2 seconds.   Neurological:     Mental Status: She is alert and oriented to person, place, and time.     Gait: Gait abnormal.   Psychiatric:        Mood and Affect: Mood normal.        Behavior: Behavior normal.     Ortho Exam  Imaging: No results found.  Past Medical/Family/Surgical/Social History: Medications & Allergies reviewed per EMR, new medications updated. Patient Active Problem List   Diagnosis Date  Noted   Urge incontinence of urine 02/11/2023   Xerosis of skin 02/11/2023   Lumbar facet arthropathy 11/05/2022   Seizures (HCC) 11/05/2022   Personal history of fall 11/05/2022   Unilateral primary osteoarthritis, left knee 04/14/2022   Myalgia due to statin 02/26/2022   Atherosclerosis of aorta (HCC) 07/25/2021   Urinary tract infection due to ESBL Klebsiella 04/13/2021   Acute metabolic encephalopathy 04/09/2021   Hypertensive urgency 04/09/2021   Lactic acidosis 04/09/2021   Lab test positive for detection of COVID-19 virus 04/09/2021   Abnormal urinalysis 04/09/2021   Lumbar radiculopathy 02/06/2021   Pes anserinus bursitis of left knee 09/03/2020   Trochanteric bursitis of left hip 09/03/2020   Closed intertrochanteric fracture of left femur (HCC) 07/18/2020   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   Synovial cyst of  lumbar facet joint 04/05/2020   Spinal stenosis of lumbar region with neurogenic claudication 04/05/2020   Dehydration 04/05/2020   Closed wedge compression fracture of L1 vertebra (HCC) 04/04/2020   Ambulatory dysfunction 04/04/2020   Intractable low back pain 04/04/2020   L1 vertebral fracture (HCC) 04/04/2020   Urine, incontinence, stress female 02/07/2019   Knee osteoarthritis 02/01/2018   Iron deficiency anemia 10/28/2017   Rhinitis, allergic 07/27/2017   GERD without esophagitis 06/16/2017   Chronic pain disorder 06/16/2017   Vitamin D  deficiency, unspecified 05/25/2017   Hyperlipidemia 05/25/2017   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   Pain 02/26/2017   Chronic low back pain 12/02/2016   Abdominal pain 03/26/2016   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   Peptic esophagitis 03/26/2016   Neuralgia neuritis, sciatic nerve 03/26/2016   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   Chronic insomnia 03/30/2015   Chronic migraine 01/10/2015   Depression 04/20/2014   Generalized anxiety disorder 04/20/2014   Chronic back pain 04/20/2014   Chronic daily headache 04/20/2014   Essential hypertension 04/20/2014   Migraine headache 04/19/2014   Past Medical History:  Diagnosis Date   Anxiety    Arthritis    Chronic insomnia 03/30/2015   Chronic low back pain 12/02/2016   Depression    GERD (gastroesophageal reflux disease)    Hiatal hernia    High cholesterol    Hypertelorism    Hypertension    Meningioma (HCC)    Migraine    Family History  Problem Relation Age of Onset   Heart Problems Mother    Migraines Father    Stroke Father    Epilepsy Brother    Cancer Brother 79       unknown type - metastatic   Past Surgical History:  Procedure Laterality Date   FEMUR IM NAIL Left 06/02/2020   Procedure: INTRAMEDULLARY (IM) RETROGRADE FEMORAL NAILING - LEFT;  Surgeon: Adonica Hoose, MD;  Location: [facility] OR;  Service: Orthopedics;  Laterality: Left;   STOMACH SURGERY     Social History    Occupational History   Occupation: Retired  Tobacco Use   Smoking status: Never   Smokeless tobacco: Never  Vaping Use   Vaping status: Never Used  Substance and Sexual Activity   Alcohol  use: No   Drug use: No   Sexual activity: Not Currently

## 2024-04-01 NOTE — Progress Notes (Unsigned)
 Pain Scale   Average Pain 2 Patient advising she has chronic lower back pain radiating to left hip area.        +Driver, -BT, -Dye Allergies.

## 2024-04-03 LAB — DRUG TOX MONITOR 1 W/CONF, ORAL FLD

## 2024-04-03 LAB — DRUG TOX ALC METAB W/CON, ORAL FLD: Alcohol Metabolite: NEGATIVE ng/mL (ref <25)

## 2024-04-04 ENCOUNTER — Encounter: Payer: Self-pay | Admitting: Neurology

## 2024-04-04 ENCOUNTER — Ambulatory Visit (INDEPENDENT_AMBULATORY_CARE_PROVIDER_SITE_OTHER): Payer: Medicare Other | Admitting: Neurology

## 2024-04-04 VITALS — BP 115/59 | HR 81 | Ht 60.0 in | Wt 152.0 lb

## 2024-04-04 DIAGNOSIS — G43009 Migraine without aura, not intractable, without status migrainosus: Secondary | ICD-10-CM | POA: Diagnosis not present

## 2024-04-04 DIAGNOSIS — Z87898 Personal history of other specified conditions: Secondary | ICD-10-CM | POA: Diagnosis not present

## 2024-04-04 MED ORDER — LEVETIRACETAM 500 MG PO TABS: ORAL_TABLET | ORAL | 11 refills | Status: AC

## 2024-04-04 MED ORDER — GABAPENTIN 300 MG PO CAPS: ORAL_CAPSULE | ORAL | 11 refills | Status: AC

## 2024-04-04 NOTE — Patient Instructions (Signed)
Always a pleasure to see you. Continue all your medications. Follow-up in 1 year, call for any changes   Seizure Precautions: 1. If medication has been prescribed for you to prevent seizures, take it exactly as directed.  Do not stop taking the medicine without talking to your doctor first, even if you have not had a seizure in a long time.   2. Avoid activities in which a seizure would cause danger to yourself or to others.  Don't operate dangerous machinery, swim alone, or climb in high or dangerous places, such as on ladders, roofs, or girders.  Do not drive unless your doctor says you may.  3. If you have any warning that you may have a seizure, lay down in a safe place where you can't hurt yourself.    4.  No driving for 6 months from last seizure, as per [facility].   Please refer to the following link on the [employer]ile[employer]'s website for more information: http://www.epilepsyfoundation.org/answerplace/Social/driving/drivingu.cfm   5.  Maintain good sleep hygiene.  6.  Contact your doctor if you have any problems that may be related to the medicine you are taking.  7.  Call 911 and bring the patient back to the ED if:        A.  The seizure lasts longer than 5 minutes.       B.  The patient doesn't awaken shortly after the seizure  C.  The patient has new problems such as difficulty seeing, speaking or moving  D.  The patient was injured during the seizure  E.  The patient has a temperature over 102 F (39C)  F.  The patient vomited and now is having trouble breathing
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
## 2024-04-26 ENCOUNTER — Other Ambulatory Visit: Payer: Self-pay | Admitting: Family Medicine

## 2024-04-26 ENCOUNTER — Telehealth: Payer: Self-pay

## 2024-04-26 DIAGNOSIS — F411 Generalized anxiety disorder: Secondary | ICD-10-CM

## 2024-04-26 NOTE — Telephone Encounter (Signed)
 Last filled 03/31/24 - next refill 04/30/24 per PCP.

## 2024-04-26 NOTE — Telephone Encounter (Signed)
 In PCP's basket for review.

## 2024-04-26 NOTE — Telephone Encounter (Signed)
 Copied from CRM [id number]. Topic: Clinical - Medication Question >> Apr 26, 2024  9:41 AM Deaijah H wrote: Reason for CRM: Patient would like to know if she could receive prescription ALPRAZolam  1 MG today if possible due to almost running out

## 2024-05-04 ENCOUNTER — Ambulatory Visit: Admitting: Physical Medicine and Rehabilitation

## 2024-05-04 ENCOUNTER — Other Ambulatory Visit: Payer: Self-pay

## 2024-05-04 VITALS — BP 117/74 | HR 77

## 2024-05-04 DIAGNOSIS — M5416 Radiculopathy, lumbar region: Secondary | ICD-10-CM | POA: Diagnosis not present

## 2024-05-04 MED ORDER — METHYLPREDNISOLONE ACETATE 40 MG/ML IJ SUSP
40.0000 mg | Freq: Once | INTRAMUSCULAR | Status: AC
Start: 2024-05-04 — End: 2024-05-04
  Administered 2024-05-04: 40 mg

## 2024-05-04 NOTE — Progress Notes (Signed)
 Pain Scale   Average Pain 7 Patient advising she has chronic lower back pain radiating to right hip/leg area.patient advising she never get relief until she get an injection.        +Driver, -BT, -Dye Allergies.

## 2024-05-04 NOTE — Patient Instructions (Signed)

## 2024-05-05 ENCOUNTER — Other Ambulatory Visit: Payer: Self-pay | Admitting: Family Medicine

## 2024-05-05 DIAGNOSIS — N393 Stress incontinence (female) (male): Secondary | ICD-10-CM

## 2024-05-08 NOTE — Procedures (Signed)
 Lumbosacral Transforaminal Epidural Steroid Injection - Sub-Pedicular Approach with Fluoroscopic Guidance  Patient: Tracey Armstrong      Date of Birth: 07/03/1935 MRN: [id number] PCP: [place]iland, [name], MD      Visit Date: 05/04/2024   Universal Protocol:    Date/Time: 05/04/2024  Consent Given By: the patient  Position: PRONE  Additional Comments: Vital signs were monitored before and after the procedure. Patient was prepped and draped in the usual sterile fashion. The correct patient, procedure, and site was verified.   Injection Procedure Details:   Procedure diagnoses: Lumbar radiculopathy [M54.16]    Meds Administered:  Meds ordered this encounter  Medications   methylPREDNISolone  acetate (DEPO-MEDROL ) injection 40 mg    Laterality: Left  Location/Site: L4  Needle:5.0 in., 22 ga.  Short bevel or Quincke spinal needle  Needle Placement: Transforaminal  Findings:    -Comments: Excellent flow of contrast along the nerve, nerve root and into the epidural space.  Procedure Details: After squaring off the end-plates to get a true AP view, the C-arm was positioned so that an oblique view of the foramen as noted above was visualized. The target area is just inferior to the nose of the scotty dog or sub pedicular. The soft tissues overlying this structure were infiltrated with 2-3 ml. of 1% Lidocaine  without Epinephrine.  The spinal needle was inserted toward the target using a trajectory view along the fluoroscope beam.  Under AP and lateral visualization, the needle was advanced so it did not puncture dura and was located close the 6 O'Clock position of the pedical in AP tracterory. Biplanar projections were used to confirm position. Aspiration was confirmed to be negative for CSF and/or blood. A 1-2 ml. volume of Isovue -250 was injected and flow of contrast was noted at each level. Radiographs were obtained for documentation purposes.   After attaining the desired flow of  contrast documented above, a 0.5 to 1.0 ml test dose of 0.25% Marcaine  was injected into each respective transforaminal space.  The patient was observed for 90 seconds post injection.  After no sensory deficits were reported, and normal lower extremity motor function was noted,   the above injectate was administered so that equal amounts of the injectate were placed at each foramen (level) into the transforaminal epidural space.   Additional Comments:  The patient tolerated the procedure well Dressing: 2 x 2 sterile gauze and Band-Aid    Post-procedure details: Patient was observed during the procedure. Post-procedure instructions were reviewed.  Patient left the clinic in stable condition.

## 2024-05-08 NOTE — Progress Notes (Signed)
 Tracey Armstrong - 89 y.o. female MRN [id number]  Date of birth: [birth year]  Office Visit Note: Visit Date: 05/04/2024 PCP: [place]iland, [name], MD Referred by: Swaz[place], [name], MD  Subjective: Chief Complaint  Patient presents with   Lower Back - Pain   HPI:  Tracey Armstrong is a 89 y.o. female who comes in today at the request of Duwaine Pouch, FNP for planned Left L4-5 Lumbar Transforaminal epidural steroid injection with fluoroscopic guidance.  The patient has failed conservative care including home exercise, medications, time and activity modification.  This injection will be diagnostic and hopefully therapeutic.  Please see requesting physician notes for further details and justification.  Depending on relief she should follow-up with Duwaine Pouch, FNP for potential left greater trochanteric bursa injection versus deep trigger point in the quadratus lumborum.   ROS Otherwise per HPI.  Assessment & Plan: Visit Diagnoses:    ICD-10-CM   1. Lumbar radiculopathy  M54.16 XR C-ARM NO REPORT    Epidural Steroid injection    methylPREDNISolone  acetate (DEPO-MEDROL ) injection 40 mg      Plan: No additional findings.   Meds & Orders:  Meds ordered this encounter  Medications   methylPREDNISolone  acetate (DEPO-MEDROL ) injection 40 mg    Orders Placed This Encounter  Procedures   XR C-ARM NO REPORT   Epidural Steroid injection    Follow-up: No follow-ups on file.   Procedures: No procedures performed  Lumbosacral Transforaminal Epidural Steroid Injection - Sub-Pedicular Approach with Fluoroscopic Guidance  Patient: Tracey Armstrong      Date of Birth: [birth year] MRN: [id number] PCP: [place], [name], MD      Visit Date: 05/04/2024   Universal Protocol:    Date/Time: 05/04/2024  Consent Given By: the patient  Position: PRONE  Additional Comments: Vital signs were monitored before and after the procedure. Patient was prepped and draped in the usual sterile fashion. The  correct patient, procedure, and site was verified.   Injection Procedure Details:   Procedure diagnoses: Lumbar radiculopathy [M54.16]    Meds Administered:  Meds ordered this encounter  Medications   methylPREDNISolone  acetate (DEPO-MEDROL ) injection 40 mg    Laterality: Left  Location/Site: L4  Needle:5.0 in., 22 ga.  Short bevel or Quincke spinal needle  Needle Placement: Transforaminal  Findings:    -Comments: Excellent flow of contrast along the nerve, nerve root and into the epidural space.  Procedure Details: After squaring off the end-plates to get a true AP view, the C-arm was positioned so that an oblique view of the foramen as noted above was visualized. The target area is just inferior to the nose of the scotty dog or sub pedicular. The soft tissues overlying this structure were infiltrated with 2-3 ml. of 1% Lidocaine  without Epinephrine.  The spinal needle was inserted toward the target using a trajectory view along the fluoroscope beam.  Under AP and lateral visualization, the needle was advanced so it did not puncture dura and was located close the 6 O'Clock position of the pedical in AP tracterory. Biplanar projections were used to confirm position. Aspiration was confirmed to be negative for CSF and/or blood. A 1-2 ml. volume of Isovue -250 was injected and flow of contrast was noted at each level. Radiographs were obtained for documentation purposes.   After attaining the desired flow of contrast documented above, a 0.5 to 1.0 ml test dose of 0.25% Marcaine  was injected into each respective transforaminal space.  The patient was observed for 90 seconds post injection.  After no sensory deficits were reported, and normal lower extremity motor function was noted,   the above injectate was administered so that equal amounts of the injectate were placed at each foramen (level) into the transforaminal epidural space.   Additional Comments:  The patient tolerated the  procedure well Dressing: 2 x 2 sterile gauze and Band-Aid    Post-procedure details: Patient was observed during the procedure. Post-procedure instructions were reviewed.  Patient left the clinic in stable condition.    Clinical History: CT LUMBAR SPINE WITHOUT CONTRAST   TECHNIQUE:  Multidetector CT imaging of the lumbar spine was performed without  intravenous contrast administration. Multiplanar CT image  reconstructions were also generated.   COMPARISON:  Lumbar spine radiographs 04/04/2020 and MRI 09/09/2018   FINDINGS:  Segmentation: 5 lumbar type vertebrae.   Alignment: Mild thoracolumbar levoscoliosis. Grade 1 anterolisthesis  of L4 on L5, unchanged from the prior MRI.   Vertebrae: L1 superior endplate compression fracture with 30%  vertebral body height loss and 4 mm retropulsion of the superior  endplate. A fracture line remains visible although there is  surrounding sclerosis. Hemangioma in the L4 vertebral body.   Paraspinal and other soft tissues: Aortic atherosclerosis without  aneurysm.   Disc levels: Disc bulging and posterior element hypertrophy result  in chronic mild-to-moderate spinal stenosis at L3-4 and moderate to  severe spinal stenosis at L4-5. No significant spinal stenosis  related to mild L1 retropulsion.   IMPRESSION:  1. Possibly subacute L1 compression fracture with 30% height loss  and 4 mm retropulsion. No associated spinal stenosis.  2. Chronic mild-to-moderate spinal stenosis at L3-4 and moderate to  severe spinal stenosis at L4-5.  3. Aortic Atherosclerosis (ICD10-I70.0).    Electronically Signed    By: Dasie Hamburg M.D.    On: 04/04/2020 17:39     Objective:  VS:  HT:    WT:   BMI:     BP:117/74  HR:77bpm  TEMP: ( )  RESP:  Physical Exam Vitals and nursing note reviewed.  Constitutional:      General: She is not in acute distress.    Appearance: Normal appearance. She is well-developed. She is not ill-appearing.   HENT:     Head: Normocephalic and atraumatic.     Right Ear: External ear normal.     Left Ear: External ear normal.  Eyes:     Extraocular Movements: Extraocular movements intact.     Conjunctiva/sclera: Conjunctivae normal.     Pupils: Pupils are equal, round, and reactive to light.  Cardiovascular:     Rate and Rhythm: Normal rate.     Pulses: Normal pulses.  Pulmonary:     Effort: Pulmonary effort is normal. No respiratory distress.  Abdominal:     General: There is no distension.     Palpations: Abdomen is soft.  Musculoskeletal:        General: Tenderness present.     Cervical back: Neck supple.     Right lower leg: No edema.     Left lower leg: No edema.     Comments: Patient has good distal strength with no pain over the greater trochanters.  No clonus or focal weakness.  Skin:    General: Skin is warm and dry.     Findings: No erythema, lesion or rash.  Neurological:     General: No focal deficit present.     Mental Status: She is alert and oriented to person, place, and time.  Cranial Nerves: No cranial nerve deficit.     Sensory: No sensory deficit.     Motor: No weakness or abnormal muscle tone.     Coordination: Coordination normal.     Gait: Gait abnormal.  Psychiatric:        Mood and Affect: Mood normal.        Behavior: Behavior normal.      Imaging: No results found.

## 2024-05-17 ENCOUNTER — Telehealth: Payer: Self-pay

## 2024-05-17 DIAGNOSIS — G894 Chronic pain syndrome: Secondary | ICD-10-CM

## 2024-05-17 DIAGNOSIS — M47816 Spondylosis without myelopathy or radiculopathy, lumbar region: Secondary | ICD-10-CM

## 2024-05-17 MED ORDER — HYDROCODONE-ACETAMINOPHEN 5-325 MG PO TABS: 0.5000 | ORAL_TABLET | Freq: Two times a day (BID) | ORAL | 0 refills | Status: DC | PRN

## 2024-05-19 NOTE — Telephone Encounter (Signed)
 Notified of refill status.

## 2024-05-30 ENCOUNTER — Other Ambulatory Visit: Payer: Self-pay

## 2024-05-30 ENCOUNTER — Ambulatory Visit (INDEPENDENT_AMBULATORY_CARE_PROVIDER_SITE_OTHER): Admitting: Physician Assistant

## 2024-05-30 ENCOUNTER — Encounter: Payer: Self-pay | Admitting: Physician Assistant

## 2024-05-30 DIAGNOSIS — M7062 Trochanteric bursitis, left hip: Secondary | ICD-10-CM | POA: Diagnosis not present

## 2024-05-30 MED ORDER — LIDOCAINE HCL 1 % IJ SOLN
3.0000 mL | INTRAMUSCULAR | Status: AC | PRN
  Administered 2024-05-30 (×2): 3 mL

## 2024-05-30 MED ORDER — METHYLPREDNISOLONE ACETATE 40 MG/ML IJ SUSP
40.0000 mg | INTRAMUSCULAR | Status: AC | PRN
  Administered 2024-05-30 (×2): 40 mg via INTRA_ARTICULAR

## 2024-05-30 NOTE — Progress Notes (Signed)
   Procedure Note  Patient: Tracey Armstrong             Date of Birth: 17-Oct-1935           MRN: [id number]             Visit Date: 05/30/2024 HPI: Mrs. Lady comes in today due to left hip pain.  She is having left hip pain that radiates down to the knee.  Pain is increased over the last 2 weeks.  She states she can place the thumb on her spot and points to the trochanteric region.  She wants an injection today.  She has had no new injuries.  He does have a history of a left femur fracture requiring a grade IM nailing.  Physical exam: Left hip good range of motion.  Tenderness over the trochanteric region and down the IT band to the insertion of the IT band.  AP pelvis lateral left lateral hip: Bilateral hips well located.  Hips overall well-preserved.  Proximal portion of the retrograde IM nail is without change from prior radiographs.  There is backed out screw which is unchanged.  No acute findings  Procedures: Visit Diagnoses:  1. Pain in left hip     Large Joint Inj: L greater trochanter on 05/30/2024 3:47 PM Indications: pain Details: 22 G 1.5 in needle, lateral approach  Arthrogram: No  Medications: 3 mL lidocaine  1 %; 40 mg methylPREDNISolone  acetate 40 MG/ML Outcome: tolerated well, no immediate complications Procedure, treatment alternatives, risks and benefits explained, specific risks discussed. Consent was given by the patient. Immediately prior to procedure a time out was called to verify the correct patient, procedure, equipment, support staff and site/side marked as required. Patient was prepped and draped in the usual sterile fashion.     Plan: She knows to wait at least 3 months between injections.  Questions were encouraged and answered at length.

## 2024-06-01 ENCOUNTER — Encounter: Payer: Self-pay | Admitting: Registered Nurse

## 2024-06-01 ENCOUNTER — Encounter: Attending: Physical Medicine and Rehabilitation | Admitting: Registered Nurse

## 2024-06-01 VITALS — BP 141/71 | HR 80 | Ht 60.0 in | Wt 148.2 lb

## 2024-06-01 DIAGNOSIS — G894 Chronic pain syndrome: Secondary | ICD-10-CM | POA: Insufficient documentation

## 2024-06-01 DIAGNOSIS — M7062 Trochanteric bursitis, left hip: Secondary | ICD-10-CM | POA: Insufficient documentation

## 2024-06-01 DIAGNOSIS — M47816 Spondylosis without myelopathy or radiculopathy, lumbar region: Secondary | ICD-10-CM | POA: Insufficient documentation

## 2024-06-01 DIAGNOSIS — Z79899 Other long term (current) drug therapy: Secondary | ICD-10-CM | POA: Insufficient documentation

## 2024-06-01 DIAGNOSIS — Z5181 Encounter for therapeutic drug level monitoring: Secondary | ICD-10-CM | POA: Insufficient documentation

## 2024-06-01 DIAGNOSIS — M5416 Radiculopathy, lumbar region: Secondary | ICD-10-CM | POA: Diagnosis not present

## 2024-06-01 MED ORDER — HYDROCODONE-ACETAMINOPHEN 5-325 MG PO TABS
0.5000 | ORAL_TABLET | Freq: Two times a day (BID) | ORAL | 0 refills | Status: DC | PRN
Start: 2024-06-01 — End: 2024-06-01

## 2024-06-01 MED ORDER — HYDROCODONE-ACETAMINOPHEN 5-325 MG PO TABS: 0.5000 | ORAL_TABLET | Freq: Two times a day (BID) | ORAL | 0 refills | Status: AC | PRN

## 2024-06-01 NOTE — Progress Notes (Signed)
 Subjective:    Patient ID: Tracey Armstrong, female    DOB: 08/22/1935, 89 y.o.   MRN: [id number]  HPI: Tracey Armstrong is a 89 y.o. female who returns for follow up appointment for chronic pain and medication refill. She states her pain is located in her lower bacl radiating into her left hip and left lower extremity. She rates her pain 9. Her current exercise regime is walking with her walker short distances.   Tracey Armstrong Morphine  equivalent is 10.23  MME. She  is also prescribed Alprazolam   by Dr.Jordan .We have discussed the black box warning of using opioids and benzodiazepines. I highlighted the dangers of using these drugs together and discussed the adverse events including respiratory suppression, overdose, cognitive impairment and importance of compliance with current regimen. We will continue to monitor and adjust as indicated.   Last Oral Swab was Performed 03/30/2024, no medication noted.   Pain Inventory Average Pain 10 Pain Right Now 9 My pain is aching  In the last 24 hours, has pain interfered with the following? General activity 0 Relation with others 0 Enjoyment of life 8 What TIME of day is your pain at its worst? daytime Sleep (in general) Fair  Pain is worse with: inactivity Pain improves with: rest, heat/ice, medication, and injections Relief from Meds: 9  Family History  Problem Relation Age of Onset   Heart Problems Mother    Migraines Father    Stroke Father    Epilepsy Brother    Cancer Brother 45       unknown type - metastatic   Social History   Socioeconomic History   Marital status: Widowed    Spouse name: Not on file   Number of children: 4   Years of education: 10   Highest education level: Not on file  Occupational History   Occupation: Retired  Tobacco Use   Smoking status: Never   Smokeless tobacco: Never  Vaping Use   Vaping status: Never Used  Substance and Sexual Activity   Alcohol  use: No   Drug use: No   Sexual activity: Not  Currently  Other Topics Concern   Not on file  Social History Narrative   Lives in [facility] - [facility]      Patient drinks 1 glass of tea daily.      Patient is right handed.   Social Drivers of Corporate investment banker Strain: Low Risk  (11/30/2023)   Overall Financial Resource Strain (CARDIA)    Difficulty of Paying Living Expenses: Not hard at all  Food Insecurity: No Food Insecurity (11/30/2023)   Hunger Vital Sign    Worried About Running Out of Food in the Last Year: Never true    Ran Out of Food in the Last Year: Never true  Transportation Needs: No Transportation Needs (11/30/2023)   PRAPARE - Administrator, Civil Service (Medical): No    Lack of Transportation (Non-Medical): No  Physical Activity: Insufficiently Active (11/30/2023)   Exercise Vital Sign    Days of Exercise per Week: 2 days    Minutes of Exercise per Session: 60 min  Stress: No Stress Concern Present (11/30/2023)   [employer] of Occupational Health - Occupational Stress Questionnaire    Feeling of Stress : Not at all  Social Connections: Moderately Integrated (11/30/2023)   Social Connection and Isolation Panel    Frequency of Communication with Friends and Family: More than three times a week    Frequency of  Social Gatherings with Friends and Family: More than three times a week    Attends Religious Services: More than 4 times per year    Active Member of [employer] or Organizations: Yes    Attends Banker Meetings: More than 4 times per year    Marital Status: Widowed   Past Surgical History:  Procedure Laterality Date   FEMUR IM NAIL Left 06/02/2020   Procedure: INTRAMEDULLARY (IM) RETROGRADE FEMORAL NAILING - LEFT;  Surgeon: Fidel Rogue, MD;  Location: [facility] OR;  Service: Orthopedics;  Laterality: Left;   STOMACH SURGERY     Past Surgical History:  Procedure Laterality Date   FEMUR IM NAIL Left 06/02/2020   Procedure: INTRAMEDULLARY (IM) RETROGRADE FEMORAL NAILING -  LEFT;  Surgeon: Fidel Rogue, MD;  Location: [facility] OR;  Service: Orthopedics;  Laterality: Left;   STOMACH SURGERY     Past Medical History:  Diagnosis Date   Anxiety    Arthritis    Chronic insomnia 03/30/2015   Chronic low back pain 12/02/2016   Depression    GERD (gastroesophageal reflux disease)    Hiatal hernia    High cholesterol    Hypertelorism    Hypertension    Meningioma (HCC)    Migraine    BP (!) 141/71   Pulse 80   Ht 5' (1.524 m)   Wt 148 lb 3.2 oz (67.2 kg)   SpO2 98%   BMI 28.94 kg/m   Opioid Risk Score:   Fall Risk Score:  `1  Depression screen [facility] 2/9     06/01/2024    9:58 AM 06/01/2024    9:46 AM 03/30/2024   10:17 AM 11/30/2023   11:39 AM 06/24/2023   11:05 AM 03/27/2023   10:59 AM 02/23/2023   11:07 AM  Depression screen PHQ 2/9  Decreased Interest 1 0 0 0 0 0 1  Down, Depressed, Hopeless 1 0 0 0 0 0 1  PHQ - 2 Score 2 0 0 0 0 0 2  Altered sleeping   0      Tired, decreased energy   0      Change in appetite   0      Feeling bad or failure about yourself    0      Trouble concentrating   0      Moving slowly or fidgety/restless   0      Suicidal thoughts   0      PHQ-9 Score   0         Review of Systems  Musculoskeletal:  Positive for back pain.  All other systems reviewed and are negative.      Objective:   Physical Exam Vitals and nursing note reviewed.  Constitutional:      Appearance: Normal appearance.  Cardiovascular:     Rate and Rhythm: Normal rate and regular rhythm.     Pulses: Normal pulses.     Heart sounds: Normal heart sounds.  Pulmonary:     Effort: Pulmonary effort is normal.     Breath sounds: Normal breath sounds.  Musculoskeletal:     Comments: Normal Muscle Bulk and Muscle Testing Reveals:  Upper Extremities: Full ROM and Muscle Strength 5/5 Lumbar Paraspinal Tenderness: L-3-L-5 Left Greater Trochanter Tenderness Lower Extremities: Full ROM and Muscle Strength 5/5 Arises from Chair slowly using walker for  support Narrow Based  Gait     Skin:    General: Skin is warm and dry.  Neurological:  Mental Status: She is alert and oriented to person, place, and time.  Psychiatric:        Mood and Affect: Mood normal.        Behavior: Behavior normal.          Assessment & Plan:  Lumbar Radiculitis: S/P on 03/25/2023: Dr Eldonna : Lumbosacral Transforaminal Epidural Steroid Injection - Sub-Pedicular Approach with Fluoroscopic Guidance , with Good relief noted. 06/01/2024 2. Lumbar Facet Arthropathy: Continue HEP as tolerated. Continue current medication regimen. Continue to Monitor. 06/01/2024 3. Chronic Pain Syndrome: Refilled: Hydrocodone  5 mg 0.5-1 tablet twice a day as needed for pain #45. We will continue the opioid monitoring program, this consists of regular clinic visits, examinations, urine drug screen, pill counts as well as use of [place]  Controlled Substance Reporting system. A 12 month History has been reviewed on the [place]  Controlled Substance Reporting System on 06/01/2024 Continue current medication regimen.     F/U with 2 months

## 2024-06-16 ENCOUNTER — Telehealth: Payer: Self-pay | Admitting: Physical Medicine and Rehabilitation

## 2024-06-16 NOTE — Telephone Encounter (Signed)
 Per Dr. Eldonna, F/U with [facility] to evaluate pain target area.

## 2024-06-16 NOTE — Telephone Encounter (Signed)
 Patient called  and needs a appointment for [facility] for lower back pain. CB#[phone]

## 2024-06-21 ENCOUNTER — Ambulatory Visit: Admitting: Gastroenterology

## 2024-06-23 ENCOUNTER — Encounter: Payer: Self-pay | Admitting: Physician Assistant

## 2024-06-23 ENCOUNTER — Ambulatory Visit (INDEPENDENT_AMBULATORY_CARE_PROVIDER_SITE_OTHER): Admitting: Physician Assistant

## 2024-06-23 ENCOUNTER — Other Ambulatory Visit

## 2024-06-23 VITALS — BP 128/60 | HR 84 | Ht <= 58 in | Wt 146.2 lb

## 2024-06-23 DIAGNOSIS — R1310 Dysphagia, unspecified: Secondary | ICD-10-CM

## 2024-06-23 DIAGNOSIS — R300 Dysuria: Secondary | ICD-10-CM | POA: Diagnosis not present

## 2024-06-23 DIAGNOSIS — R194 Change in bowel habit: Secondary | ICD-10-CM | POA: Diagnosis not present

## 2024-06-23 DIAGNOSIS — R1013 Epigastric pain: Secondary | ICD-10-CM | POA: Diagnosis not present

## 2024-06-23 MED ORDER — OMEPRAZOLE 40 MG PO CPDR: 40.0000 mg | DELAYED_RELEASE_CAPSULE | Freq: Every day | ORAL | 3 refills | Status: DC

## 2024-06-23 NOTE — Progress Notes (Signed)
 Chief Complaint: Diarrhea, Dysphagia and Epigastric Pain  HPI:    Tracey Armstrong is an 89 year old female with a past medical history as listed below including depression, GERD and multiple others, who was referred to me by [place], [name], MD for a complaint of diarrhea, dysphagia and epigastric pain.    2004 colonoscopy with Dr. Rosalie.  Cannot see report.    11/11/2023 CMP was normal.  Vitamin D  normal.    05/30/2024 patient followed with orthopedics in regards to left hip pain.  She was given a injection of methylprednisolone .    Today, the patient presents to clinic accompanied by a family member and discusses multiple GI complaints.  First off she discusses some dysphagia feeling like food kind of seems to get knotted up in her esophagus at times and not go down right.  Along with this some epigastric abdominal discomfort when eating.  Chronic reflux symptoms on Omeprazole  20 mg daily.  Also describes what sounds like a perforated gastric ulcer years ago fixed with surgery per her in [place] in 2015.  I cannot see records of this other than as that is noted other physicians notes.    Also describes a change in bowel habits into little pieces and some mucus, apparently this was worse and now is slightly more back to normal.  She is on multiple drying medications that could be contributing to this including acute hydrocodone  daily.  Denies generalized bloating or abdominal discomfort.    Also describes some urinary hesitancy and burning and feels like she may have a UTI.    Denies fever, chills, weight loss or blood in her stool.  Past Medical History:  Diagnosis Date   Anxiety    Arthritis    Chronic insomnia 03/30/2015   Chronic low back pain 12/02/2016   Depression    GERD (gastroesophageal reflux disease)    Hiatal hernia    High cholesterol    Hypertelorism    Hypertension    Meningioma (HCC)    Migraine     Past Surgical History:  Procedure Laterality Date   FEMUR IM NAIL Left  06/02/2020   Procedure: INTRAMEDULLARY (IM) RETROGRADE FEMORAL NAILING - LEFT;  Surgeon: Fidel Rogue, MD;  Location: [facility] OR;  Service: Orthopedics;  Laterality: Left;   STOMACH SURGERY      Current Outpatient Medications  Medication Sig Dispense Refill   ALPRAZolam  (XANAX ) 1 MG tablet TAKE 1/2 TO 1 TABLET(0.5 TO 1 MG) BY MOUTH TWICE DAILY AS NEEDED FOR ANXIETY 45 tablet 3   ammonium lactate  (LAC-HYDRIN ) 12 % lotion Apply 1 Application topically daily as needed for dry skin (on heels). 222 g 0   antiseptic oral rinse (BIOTENE) LIQD Use 3 mls 3-4 times per day for dry mouth. Do not swallow. 237 mL 0   Cholecalciferol  (VITAMIN D3) 1.25 MG (50000 UT) CAPS TAKE 1 CAPSULE BY MOUTH EVERY 14 DAYS 7 capsule 3   Dextran 70-Hypromellose (ARTIFICIAL TEARS PF OP) Place 1-2 drops into both eyes See admin instructions. Instill 1-2 drops into both eyes at bedtime and an additional 2-3 times a day as needed for dryness     diclofenac  (FLECTOR ) 1.3 % PTCH Place 1 patch onto the skin 2 (two) times daily. 60 patch 1   diphenhydramine-acetaminophen  (TYLENOL  PM) 25-500 MG TABS tablet Take 1.5 tablets by mouth at bedtime.     gabapentin  (NEURONTIN ) 300 MG capsule Take 1 capsule twice a day 60 capsule 11   HYDROcodone -acetaminophen  (NORCO) 5-325 MG tablet  Take 0.5-1 tablets by mouth 2 (two) times daily as needed for moderate pain (pain score 4-6). 45 tablet 0   levETIRAcetam  (KEPPRA ) 500 MG tablet Take 1 tablet twice a day 60 tablet 11   Magnesium  400 MG TABS Take 400 mg by mouth at bedtime.     MAXALT -MLT 10 MG disintegrating tablet DISSOLVE ONE TABLET BY MOUTH AS NEEDED HEADACHE. 9 tablet 11   Melatonin 10 MG TABS Take 10 mg by mouth at bedtime as needed (for sleep).     omeprazole  (PRILOSEC OTC) 20 MG tablet Take 20 mg by mouth daily before breakfast.     oxybutynin  (DITROPAN -XL) 10 MG 24 hr tablet TAKE 1 TABLET(10 MG) BY MOUTH AT BEDTIME 90 tablet 1   POTASSIUM PO Take 1 tablet by mouth daily with lunch.      pravastatin  (PRAVACHOL ) 20 MG tablet TAKE 1 TABLET(20 MG) BY MOUTH 3 TIMES A WEEK 13 tablet 5   pyridOXINE  (VITAMIN B-6) 100 MG tablet Take 400 mg by mouth daily with lunch.     riboflavin  (VITAMIN B-2) 100 MG TABS tablet Take 400 mg by mouth daily with lunch.     tretinoin  (RETIN-A ) 0.1 % cream APPLY TO AFFECTED AREA EVERY DAY AT BEDTIME 45 g 2   verapamil  (VERELAN ) 180 MG 24 hr capsule TAKE 1 CAPSULE(180 MG) BY MOUTH DAILY 90 capsule 3   No current facility-administered medications for this visit.    Allergies as of 06/23/2024 - Review Complete 06/01/2024  Allergen Reaction Noted   Dihydrotachysterol Other (See Comments) 03/29/2015   Trazodone and nefazodone Other (See Comments) 03/29/2015   Valium [diazepam] Other (See Comments) 03/26/2016   Citalopram Other (See Comments) 03/16/2017   Other Other (See Comments) 06/08/2017    Family History  Problem Relation Age of Onset   Heart Problems Mother    Migraines Father    Stroke Father    Epilepsy Brother    Cancer Brother 42       unknown type - metastatic    Social History   Socioeconomic History   Marital status: Widowed    Spouse name: Not on file   Number of children: 4   Years of education: 32   Highest education level: Not on file  Occupational History   Occupation: Retired  Tobacco Use   Smoking status: Never   Smokeless tobacco: Never  Vaping Use   Vaping status: Never Used  Substance and Sexual Activity   Alcohol  use: No   Drug use: No   Sexual activity: Not Currently  Other Topics Concern   Not on file  Social History Narrative   Lives in [place] [place] - [facility]      Patient drinks 1 glass of tea daily.      Patient is right handed.   Social Drivers of Corporate investment banker Strain: Low Risk  (11/30/2023)   Overall Financial Resource Strain (CARDIA)    Difficulty of Paying Living Expenses: Not hard at all  Food Insecurity: No Food Insecurity (11/30/2023)   Hunger Vital Sign    Worried About  Running Out of Food in the Last Year: Never true    Ran Out of Food in the Last Year: Never true  Transportation Needs: No Transportation Needs (11/30/2023)   PRAPARE - Administrator, Civil Service (Medical): No    Lack of Transportation (Non-Medical): No  Physical Activity: Insufficiently Active (11/30/2023)   Exercise Vital Sign    Days of Exercise per Week:  2 days    Minutes of Exercise per Session: 60 min  Stress: No Stress Concern Present (11/30/2023)   [employer] of Occupational Health - Occupational Stress Questionnaire    Feeling of Stress : Not at all  Social Connections: Moderately Integrated (11/30/2023)   Social Connection and Isolation Panel    Frequency of Communication with Friends and Family: More than three times a week    Frequency of Social Gatherings with Friends and Family: More than three times a week    Attends Religious Services: More than 4 times per year    Active Member of [employer] or Organizations: Yes    Attends Banker Meetings: More than 4 times per year    Marital Status: Widowed  Intimate Partner Violence: Not At Risk (11/30/2023)   Humiliation, Afraid, Rape, and Kick questionnaire    Fear of Current or Ex-Partner: No    Emotionally Abused: No    Physically Abused: No    Sexually Abused: No    Review of Systems:    Constitutional: No weight loss, fever or chills Skin: No rash  Cardiovascular: No chest pain  Respiratory: No SOB  Gastrointestinal: See HPI and otherwise negative Genitourinary: No dysuria  Neurological: No headache, dizziness or syncope Musculoskeletal: No new muscle or joint pain Hematologic: No bleeding  Psychiatric: No history of depression or anxiety   Physical Exam:  Vital signs: BP 128/60 (BP Location: Left Arm, Patient Position: Sitting, Cuff Size: Normal)   Pulse 84   Ht 4' 9 (1.448 m) Comment: height measured without shoes  Wt 146 lb 4 oz (66.3 kg)   BMI 31.65 kg/m    Constitutional:    Pleasant elderly Caucasian female appears to be in NAD, Well developed, Well nourished, alert and cooperative Head:  Normocephalic and atraumatic. Eyes:   PEERL, EOMI. No icterus. Conjunctiva pink. Ears:  Normal auditory acuity. Neck:  Supple Throat: Oral cavity and pharynx without inflammation, swelling or lesion.  Respiratory: Respirations even and unlabored. Lungs clear to auscultation bilaterally.   No wheezes, crackles, or rhonchi.  Cardiovascular: Normal S1, S2. No MRG. Regular rate and rhythm. No peripheral edema, cyanosis or pallor.  Gastrointestinal:  Soft, nondistended, mild epigastric ttp. No rebound or guarding. Normal bowel sounds. No appreciable masses or hepatomegaly. Rectal:  Not performed.  Msk:  Symmetrical without gross deformities. Without edema, no deformity or joint abnormality.  Neurologic:  Alert and  oriented x4;  grossly normal neurologically.  Skin:   Dry and intact without significant lesions or rashes. Psychiatric: Demonstrates good judgement and reason without abnormal affect or behaviors.  See HPI for recent labs and imaging.  Assessment: 1.  Dysphagia: Occasionally food feels like he gets hung on the way down; consider esophageal stricture versus reflux versus dysmotility 2.  Epigastric pain: Epigastric discomfort, distant history of perforated gastric ulcer it sounds like an See another physician's notes back in 2015, currently on Omeprazole  20 mg daily, but with an increase in epigastric discomfort and dysphagia symptoms as above; consider ongoing gastritis 3.  Change in bowel habits: Last colonoscopy reported in 2014, cannot see actual report, now with a change in bowel habits towards little pieces and some mucus, apparently this was more frequent and now is back to her normal frequency but still looks abnormal, she is on chronic pain medication for hip pain including Hydrocodone  as well as Gabapentin ; consider side effect from medications +/- slow transit +/-  age 37.  Urinary hesitancy and burning: Patient questions UTIs  this feels very similar to her previous UTIs  Plan: 1.  Patient is of advanced age and would be high risk for any procedures.  Due to this we will try to avoid.  Ordered an esophagram with tablet today for further evaluation of dysphagia.  Pending results could consider an EGD in the future. 2.  Increase Omeprazole  to 40 mg once daily, 30 minutes before breakfast #30 with 5 refills 3.  Reviewed anti-dysphagia measures 4.  Recommend a fiber supplement daily such as Metamucil, Citrucel or Benefiber. 5.  Recommend MiraLAX  daily 6.  Ordered a urinalysis with reflex culture given urinary hesitancy and burning per request of patient 7.  Patient to follow in clinic in 2 months or sooner if necessary.  Assigned to Dr. Nandigam today.  Tracey Failing, PA-C [facility] 06/23/2024, 1:04 PM  Cc: [place], [name], MD

## 2024-06-23 NOTE — Patient Instructions (Addendum)
 Your provider has requested that you go to the basement level for lab work before leaving today. Press B on the elevator. The lab is located at the first door on the left as you exit the elevator.  Start daily fiber supplement.   Start Miralax  1 capful daily in 8 ounces of liquid.  We have sent the following medications to your pharmacy for you to pick up at your convenience: Omeprazole  40 mg daily 30-60 minutes before breakfast.   You have been scheduled for a Barium Esophogram at [facility] (1st floor of the hospital) on Monday 07/11/24 at 10 am. Please arrive 30 minutes prior to your appointment for registration. Make certain not to have anything to eat or drink 3 hours prior to your test. If you need to reschedule for any reason, please contact radiology at [phone] to do so. __________________________________________________________________ A barium swallow is an examination that concentrates on views of the esophagus. This tends to be a double contrast exam (barium and two liquids which, when combined, create a gas to distend the wall of the oesophagus) or single contrast (non-ionic iodine  based). The study is usually tailored to your symptoms so a good history is essential. Attention is paid during the study to the form, structure and configuration of the esophagus, looking for functional disorders (such as aspiration, dysphagia, achalasia, motility and reflux) EXAMINATION You may be asked to change into a gown, depending on the type of swallow being performed. A radiologist and radiographer will perform the procedure. The radiologist will advise you of the type of contrast selected for your procedure and direct you during the exam. You will be asked to stand, sit or lie in several different positions and to hold a small amount of fluid in your mouth before being asked to swallow while the imaging is performed .In some instances you may be asked to swallow barium coated  marshmallows to assess the motility of a solid food bolus. The exam can be recorded as a digital or video fluoroscopy procedure. POST PROCEDURE It will take 1-2 days for the barium to pass through your system. To facilitate this, it is important, unless otherwise directed, to increase your fluids for the next 24-48hrs and to resume your normal diet.  This test typically takes about 30 minutes to perform. _____________________________________________________________

## 2024-06-24 ENCOUNTER — Other Ambulatory Visit

## 2024-06-25 LAB — UA/M W/RFLX CULTURE, ROUTINE

## 2024-06-27 ENCOUNTER — Ambulatory Visit: Payer: Self-pay | Admitting: Physician Assistant

## 2024-06-28 ENCOUNTER — Other Ambulatory Visit

## 2024-06-28 ENCOUNTER — Other Ambulatory Visit: Payer: Self-pay

## 2024-06-28 DIAGNOSIS — N3941 Urge incontinence: Secondary | ICD-10-CM

## 2024-06-28 DIAGNOSIS — R300 Dysuria: Secondary | ICD-10-CM

## 2024-06-29 ENCOUNTER — Other Ambulatory Visit: Payer: Self-pay | Admitting: *Deleted

## 2024-06-29 ENCOUNTER — Ambulatory Visit: Payer: Self-pay | Admitting: *Deleted

## 2024-06-29 DIAGNOSIS — R300 Dysuria: Secondary | ICD-10-CM

## 2024-06-29 NOTE — Addendum Note (Signed)
 Addended by: WILL POWELL CROME on: 06/29/2024 09:27 AM   Modules accepted: Orders

## 2024-06-30 ENCOUNTER — Other Ambulatory Visit: Payer: Self-pay | Admitting: *Deleted

## 2024-06-30 MED ORDER — NITROFURANTOIN MONOHYD MACRO 100 MG PO CAPS
100.0000 mg | ORAL_CAPSULE | Freq: Two times a day (BID) | ORAL | 0 refills | Status: AC
Start: 2024-06-30 — End: 2024-07-05

## 2024-06-30 NOTE — Progress Notes (Signed)
 Please let patient know, looks like UTI- awaiting culture, but can start Macrobid  100mg  PO BID x5 days now.  #10-no refill Thanks-JLL

## 2024-07-02 LAB — URINE CULTURE
MICRO NUMBER:: 16954241
SPECIMEN QUALITY:: ADEQUATE

## 2024-07-02 LAB — URINALYSIS W MICROSCOPIC + REFLEX CULTURE
Bilirubin Urine: NEGATIVE
Glucose, UA: NEGATIVE
Hgb urine dipstick: NEGATIVE
Hyaline Cast: NONE SEEN /LPF
Ketones, ur: NEGATIVE
Nitrites, Initial: NEGATIVE
Protein, ur: NEGATIVE
Specific Gravity, Urine: 1.009 (ref 1.001–1.035)
pH: 7.5 (ref 5.0–8.0)

## 2024-07-02 LAB — EXTRA URINE SPECIMEN

## 2024-07-02 LAB — CULTURE INDICATED

## 2024-07-02 LAB — TEST AUTHORIZATION

## 2024-07-06 ENCOUNTER — Ambulatory Visit (INDEPENDENT_AMBULATORY_CARE_PROVIDER_SITE_OTHER): Admitting: Physical Medicine and Rehabilitation

## 2024-07-06 ENCOUNTER — Encounter: Payer: Self-pay | Admitting: Physical Medicine and Rehabilitation

## 2024-07-06 DIAGNOSIS — M5416 Radiculopathy, lumbar region: Secondary | ICD-10-CM | POA: Diagnosis not present

## 2024-07-06 DIAGNOSIS — M48062 Spinal stenosis, lumbar region with neurogenic claudication: Secondary | ICD-10-CM

## 2024-07-06 DIAGNOSIS — G8929 Other chronic pain: Secondary | ICD-10-CM | POA: Diagnosis not present

## 2024-07-06 DIAGNOSIS — M5442 Lumbago with sciatica, left side: Secondary | ICD-10-CM

## 2024-07-06 NOTE — Progress Notes (Signed)
 Pain Scale   Average Pain 10 Patient advising the injection he got last time in July did not help with her lower back pain, pain is now radiating to left hip and left groin area.        +Driver, -BT, -Dye Allergies.

## 2024-07-06 NOTE — Progress Notes (Signed)
 Tracey Armstrong - 89 y.o. female MRN [id number]  Date of birth: [birth year]  Office Visit Note: Visit Date: 07/06/2024 PCP: [place], [name], MD Referred by: [place], [name], MD  Subjective: Chief Complaint  Patient presents with   Lower Back - Pain   HPI: Tracey Armstrong is a 89 y.o. female who comes in today for evaluation of chronic, worsening and severe left sided lower back pain radiating down leg. Patient is well known to us . Patient is somewhat of a poor historian. Pain ongoing for several years. No specific aggravating factors noted. States her pain worsens with standing, walking and activity. She describes her pain as sharp and sore, currently rates as 9 out of 10. Some relief of pain with home exercise regimen, rest and use of medications. She is undergoing chronic pain management with Fidela Ned, NP at [facility]. She is currently prescribed Norco. CT imaging of lumbar spine from 2021 exhibits moderate to severe spinal canal stenosis at L4-L5 and L1 superior endplate compression fracture with 30% vertebral body height. CT imaging of lumbar spine completed in the emergency department in 2023 states unchanged mild spinal canal stenosis at L4-L5 and progression of height loss at L1 vertebral fracture now with greater than 75% central height loss and 5 mm retropulsion of the posterosuperior corner. She has undergone multiple lumbar epidural steroid injections in our office. Most recent injection was left L4 transforaminal epidural steroid injection in our office on 05/04/2024. She reports greater than 80% relief of pain for almost 2 months. Patient ambulates with rolling walker. Patient denies focal weakness, numbness and tingling. Patient denies recent trauma or falls.         Review of Systems  Musculoskeletal:  Positive for back pain.  Neurological:  Negative for tingling, sensory change, focal weakness and weakness.  All other systems reviewed and are  negative.  Otherwise per HPI.  Assessment & Plan: Visit Diagnoses:    ICD-10-CM   1. Chronic left-sided low back pain with left-sided sciatica  M54.42    G89.29     2. Lumbar radiculopathy  M54.16     3. Spinal stenosis of lumbar region with neurogenic claudication  M48.062        Plan: Findings:  Chronic, worsening and severe left sided lower back pain radiating down left leg. She continues to have severe pain despite good conservative therapies such as formal physical therapy, home exercise regimen, rest and use of medications. Patients clinical presentation and exam are consistent with neurogenic claudication as a result of spinal canal stenosis. There is moderate to severe spinal canal stenosis at the level of L4-L5 on CT imaging of lumbar spine from 2021. We can look at repeating lumbar epidural steroid injection, however can't repeat at this time. I encouraged patient to continue with chronic pain management and to take Norco as prescribed. I will go ahead and place referral for injection, however would need to scheduled after 08/04/2024. She is aware of this plan and verbalizes understanding. No red flag symptoms noted upon exam today.     Meds & Orders: No orders of the defined types were placed in this encounter.  No orders of the defined types were placed in this encounter.   Follow-up: Return for Left L4 transforaminal epidural steroid injection.   Procedures: No procedures performed      Clinical History: CLINICAL DATA:  Lumbar compression fracture   EXAM: CT LUMBAR SPINE WITHOUT CONTRAST   TECHNIQUE: Multidetector CT imaging  of the lumbar spine was performed without intravenous contrast administration. Multiplanar CT image reconstructions were also generated.   RADIATION DOSE REDUCTION: This exam was performed according to the departmental dose-optimization program which includes automated exposure control, adjustment of the mA and/or kV according to patient size  and/or use of iterative reconstruction technique.   COMPARISON:  04/04/2020 lumbar spine CT   09/07/2022 lumbar spine radiographs   FINDINGS: Segmentation: Standard   Alignment: Unchanged grade 1 anterolisthesis at L4-5   Vertebrae: Progression of height loss at L1 vertebral fracture now with greater than 75% central height loss. There is 5 mm retropulsion of the posterosuperior corner. The acuity of the advanced height loss is unclear.   Paraspinal and other soft tissues: Calcific aortic atherosclerosis. No perivertebral hematoma.   Disc levels: Mild spinal canal stenosis at the L1 level, due to above described retropulsion.   There is mild spinal canal stenosis at L4-5, unchanged   IMPRESSION: 1. Progression of height loss at L1 vertebral fracture now with greater than 75% central height loss and 5 mm retropulsion of the posterosuperior corner. The acuity of the advanced height loss is unclear, but is unchanged since 09/07/2022. 2. Unchanged mild spinal canal stenosis at L4-5.   Aortic Atherosclerosis (ICD10-I70.0).     Electronically Signed   By: Franky Stanford M.D.   On: 09/19/2022 02:46   She reports that she has never smoked. She has never used smokeless tobacco. No results for input(s): HGBA1C, LABURIC in the last 8760 hours.  Objective:  VS:  HT:    WT:   BMI:     BP:   HR: bpm  TEMP: ( )  RESP:  Physical Exam Vitals and nursing note reviewed.  HENT:     Head: Normocephalic and atraumatic.     Right Ear: External ear normal.     Left Ear: External ear normal.     Nose: Nose normal.     Mouth/Throat:     Mouth: Mucous membranes are moist.  Eyes:     Extraocular Movements: Extraocular movements intact.  Cardiovascular:     Rate and Rhythm: Normal rate.     Pulses: Normal pulses.  Pulmonary:     Effort: Pulmonary effort is normal.  Abdominal:     General: Abdomen is flat. There is no distension.  Musculoskeletal:        General: Tenderness  present.     Cervical back: Normal range of motion.     Comments: Patient is slow to rise from seated position to standing. Good lumbar range of motion. No pain noted with facet loading. 5/5 strength noted with bilateral hip flexion, knee flexion/extension, ankle dorsiflexion/plantarflexion and EHL. No clonus noted bilaterally. No pain upon palpation of greater trochanters. No pain with internal/external rotation of bilateral hips. Sensation intact bilaterally. Negative slump test bilaterally. Ambulates with rolling walker, gait slow and unsteady.    Skin:    General: Skin is warm and dry.     Capillary Refill: Capillary refill takes less than 2 seconds.  Neurological:     Mental Status: She is alert and oriented to person, place, and time.     Gait: Gait abnormal.  Psychiatric:        Mood and Affect: Mood normal.        Behavior: Behavior normal.     Ortho Exam  Imaging: No results found.  Past Medical/Family/Surgical/Social History: Medications & Allergies reviewed per EMR, new medications updated. Patient Active Problem List   Diagnosis  Date Noted   Urge incontinence of urine 02/11/2023   Xerosis of skin 02/11/2023   Lumbar facet arthropathy 11/05/2022   Seizures (HCC) 11/05/2022   Personal history of fall 11/05/2022   Unilateral primary osteoarthritis, left knee 04/14/2022   Myalgia due to statin 02/26/2022   Atherosclerosis of aorta (HCC) 07/25/2021   Urinary tract infection due to ESBL Klebsiella 04/13/2021   Acute metabolic encephalopathy 04/09/2021   Hypertensive urgency 04/09/2021   Lactic acidosis 04/09/2021   Lab test positive for detection of COVID-19 virus 04/09/2021   Abnormal urinalysis 04/09/2021   Lumbar radiculopathy 02/06/2021   Pes anserinus bursitis of left knee 09/03/2020   Trochanteric bursitis of left hip 09/03/2020   Closed intertrochanteric fracture of left femur (HCC) 07/18/2020   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   Synovial  cyst of lumbar facet joint 04/05/2020   Spinal stenosis of lumbar region with neurogenic claudication 04/05/2020   Dehydration 04/05/2020   Closed wedge compression fracture of L1 vertebra (HCC) 04/04/2020   Ambulatory dysfunction 04/04/2020   Intractable low back pain 04/04/2020   L1 vertebral fracture (HCC) 04/04/2020   Urine, incontinence, stress female 02/07/2019   Knee osteoarthritis 02/01/2018   Iron deficiency anemia 10/28/2017   Rhinitis, allergic 07/27/2017   GERD without esophagitis 06/16/2017   Chronic pain disorder 06/16/2017   Vitamin D  deficiency, unspecified 05/25/2017   Hyperlipidemia 05/25/2017   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   Pain 02/26/2017   Chronic low back pain 12/02/2016   Abdominal pain 03/26/2016   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   Peptic esophagitis 03/26/2016   Neuralgia neuritis, sciatic nerve 03/26/2016   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   Chronic insomnia 03/30/2015   Chronic migraine 01/10/2015   Depression 04/20/2014   Generalized anxiety disorder 04/20/2014   Chronic back pain 04/20/2014   Chronic daily headache 04/20/2014   Essential hypertension 04/20/2014   Migraine headache 04/19/2014   Past Medical History:  Diagnosis Date   Anxiety    Arthritis    Chronic insomnia 03/30/2015   Chronic low back pain 12/02/2016   Depression    GERD (gastroesophageal reflux disease)    Hiatal hernia    High cholesterol    Hypertelorism    Hypertension    Meningioma (HCC)    Migraine    Seizures (HCC)    Family History  Problem Relation Age of Onset   Heart Problems Mother    Gallbladder disease Mother    Migraines Father    Stroke Father    Epilepsy Brother    Cancer Brother 28       unknown type - metastatic   Lung cancer Brother    Heart attack Maternal Grandfather    Heart attack Paternal Grandfather    Past Surgical History:  Procedure Laterality Date   FEMUR IM NAIL Left 06/02/2020   Procedure: INTRAMEDULLARY (IM) RETROGRADE  FEMORAL NAILING - LEFT;  Surgeon: Fidel Rogue, MD;  Location: [facility] OR;  Service: Orthopedics;  Laterality: Left;   STOMACH SURGERY     Social History   Occupational History   Occupation: Retired  Tobacco Use   Smoking status: Never   Smokeless tobacco: Never  Vaping Use   Vaping status: Never Used  Substance and Sexual Activity   Alcohol  use: No   Drug use: No   Sexual activity: Not Currently

## 2024-07-07 ENCOUNTER — Other Ambulatory Visit (HOSPITAL_COMMUNITY)

## 2024-07-11 ENCOUNTER — Ambulatory Visit (HOSPITAL_COMMUNITY)
Admission: RE | Admit: 2024-07-11 | Discharge: 2024-07-11 | Disposition: A | Discharge status: Home / Self Care | Source: Ambulatory Visit | Attending: Physician Assistant | Admitting: Physician Assistant

## 2024-07-11 DIAGNOSIS — R1310 Dysphagia, unspecified: Secondary | ICD-10-CM | POA: Diagnosis present

## 2024-07-11 DIAGNOSIS — K224 Dyskinesia of esophagus: Secondary | ICD-10-CM | POA: Diagnosis not present

## 2024-07-11 DIAGNOSIS — K222 Esophageal obstruction: Secondary | ICD-10-CM | POA: Diagnosis not present

## 2024-07-28 DIAGNOSIS — R82998 Other abnormal findings in urine: Secondary | ICD-10-CM | POA: Diagnosis not present

## 2024-07-28 DIAGNOSIS — R309 Painful micturition, unspecified: Secondary | ICD-10-CM | POA: Diagnosis not present

## 2024-08-02 NOTE — Progress Notes (Unsigned)
 Subjective:    Patient ID: Tracey Armstrong, female    DOB: [birth year], 89 y.o.   MRN: [id number]  HPI: Tracey Armstrong is a 89 y.o. female who returns for follow up appointment for chronic pain and medication refill. She states her pain is located in her lower back radiating into her left hip and left lower extremity and bilateral knee pain. She rates her pain 6. Her current exercise regime is walking short distances.   Ms. Quilter Morphine  equivalent is 10.23 MME.        Pain Inventory Average Pain 10 Pain Right Now 6 My pain is aching  In the last 24 hours, has pain interfered with the following? General activity 9 Relation with others 9 Enjoyment of life 9 What TIME of day is your pain at its worst? night Sleep (in general) Poor  Pain is worse with: bending, sitting, and standing Pain improves with: rest and medication Relief from Meds: 9  Family History  Problem Relation Age of Onset   Heart Problems Mother    Gallbladder disease Mother    Migraines Father    Stroke Father    Epilepsy Brother    Cancer Brother 63       unknown type - metastatic   Lung cancer Brother    Heart attack Maternal Grandfather    Heart attack Paternal Grandfather    Social History   Socioeconomic History   Marital status: Widowed    Spouse name: Not on file   Number of children: 4   Years of education: 39   Highest education level: Not on file  Occupational History   Occupation: Retired  Tobacco Use   Smoking status: Never   Smokeless tobacco: Never  Vaping Use   Vaping status: Never Used  Substance and Sexual Activity   Alcohol  use: No   Drug use: No   Sexual activity: Not Currently  Other Topics Concern   Not on file  Social History Narrative   Lives in [facility]      Patient drinks 1 glass of tea daily.      Patient is right handed.   Social Drivers of Corporate investment banker Strain: Low Risk  (11/30/2023)   Overall Financial Resource Strain (CARDIA)     Difficulty of Paying Living Expenses: Not hard at all  Food Insecurity: No Food Insecurity (11/30/2023)   Hunger Vital Sign    Worried About Running Out of Food in the Last Year: Never true    Ran Out of Food in the Last Year: Never true  Transportation Needs: No Transportation Needs (11/30/2023)   PRAPARE - Administrator, Civil Service (Medical): No    Lack of Transportation (Non-Medical): No  Physical Activity: Insufficiently Active (11/30/2023)   Exercise Vital Sign    Days of Exercise per Week: 2 days    Minutes of Exercise per Session: 60 min  Stress: No Stress Concern Present (11/30/2023)   [employer] of Occupational Health - Occupational Stress Questionnaire    Feeling of Stress : Not at all  Social Connections: Moderately Integrated (11/30/2023)   Social Connection and Isolation Panel    Frequency of Communication with Friends and Family: More than three times a week    Frequency of Social Gatherings with Friends and Family: More than three times a week    Attends Religious Services: More than 4 times per year    Active Member of [employer] or Organizations: Yes  Attends Banker Meetings: More than 4 times per year    Marital Status: Widowed   Past Surgical History:  Procedure Laterality Date   FEMUR IM NAIL Left 06/02/2020   Procedure: INTRAMEDULLARY (IM) RETROGRADE FEMORAL NAILING - LEFT;  Surgeon: Fidel Rogue, MD;  Location: [facility] OR;  Service: Orthopedics;  Laterality: Left;   STOMACH SURGERY     Past Surgical History:  Procedure Laterality Date   FEMUR IM NAIL Left 06/02/2020   Procedure: INTRAMEDULLARY (IM) RETROGRADE FEMORAL NAILING - LEFT;  Surgeon: Fidel Rogue, MD;  Location: [facility] OR;  Service: Orthopedics;  Laterality: Left;   STOMACH SURGERY     Past Medical History:  Diagnosis Date   Anxiety    Arthritis    Chronic insomnia 03/30/2015   Chronic low back pain 12/02/2016   Depression    GERD (gastroesophageal reflux disease)     Hiatal hernia    High cholesterol    Hypertelorism    Hypertension    Meningioma (HCC)    Migraine    Seizures (HCC)    There were no vitals taken for this visit.  Opioid Risk Score:   Fall Risk Score:  `1  Depression screen [facility] 2/9     06/01/2024    9:58 AM 06/01/2024    9:46 AM 03/30/2024   10:17 AM 11/30/2023   11:39 AM 06/24/2023   11:05 AM 03/27/2023   10:59 AM 02/23/2023   11:07 AM  Depression screen PHQ 2/9  Decreased Interest 1 0 0 0 0 0 1  Down, Depressed, Hopeless 1 0 0 0 0 0 1  PHQ - 2 Score 2 0 0 0 0 0 2  Altered sleeping   0      Tired, decreased energy   0      Change in appetite   0      Feeling bad or failure about yourself    0      Trouble concentrating   0      Moving slowly or fidgety/restless   0      Suicidal thoughts   0      PHQ-9 Score   0        Review of Systems  All other systems reviewed and are negative.      Objective:   Physical Exam Vitals and nursing note reviewed.  Constitutional:      Appearance: Normal appearance.  Cardiovascular:     Rate and Rhythm: Normal rate and regular rhythm.     Pulses: Normal pulses.     Heart sounds: Normal heart sounds.  Pulmonary:     Effort: Pulmonary effort is normal.     Breath sounds: Normal breath sounds.  Musculoskeletal:     Comments: Normal Muscle Bulk and Muscle Testing Reveals:  Upper Extremities: Full ROM and Muscle Strength 5/5 Lumbar Paraspinal Tenderness: L-4-L-5 Left Greater Trochanter Tenderness Lower Extremities: Full ROM and Muscle Strength 5/5 Arises from table slowly using walker for support Narrow Based  Gait     Skin:    General: Skin is warm and dry.  Neurological:     Mental Status: She is alert and oriented to person, place, and time.  Psychiatric:        Mood and Affect: Mood normal.        Behavior: Behavior normal.          Assessment & Plan:  Lumbar Radiculitis: S/P on 03/25/2023: Dr Eldonna : Lumbosacral Transforaminal Epidural Steroid Injection -  Sub-Pedicular Approach with Fluoroscopic Guidance , with Good relief noted. 08/03/2024 2. Lumbar Facet Arthropathy: Continue HEP as tolerated. Continue current medication regimen. Continue to Monitor. 08/03/2024 3. Chronic Pain Syndrome: Refilled: Hydrocodone  5 mg 0.5-1 tablet twice a day as needed for pain #45. We will continue the opioid monitoring program, this consists of regular clinic visits, examinations, urine drug screen, pill counts as well as use of [place]  Controlled Substance Reporting system. A 12 month History has been reviewed on the [place]  Controlled Substance Reporting System on 08/03/2024 Continue current medication regimen.  4. Left Greater Trochanter Tenderness: Continue HEP as Tolerated. Continue to Monitor. 5. Bilateral Chronic Knee Pain: Ortho Following: Continue to monitor.    F/U with 2 months

## 2024-08-03 ENCOUNTER — Encounter: Attending: Physical Medicine and Rehabilitation | Admitting: Registered Nurse

## 2024-08-03 ENCOUNTER — Encounter: Payer: Self-pay | Admitting: Registered Nurse

## 2024-08-03 VITALS — BP 108/72 | HR 76 | Ht <= 58 in | Wt 148.0 lb

## 2024-08-03 DIAGNOSIS — G894 Chronic pain syndrome: Secondary | ICD-10-CM | POA: Insufficient documentation

## 2024-08-03 DIAGNOSIS — M47816 Spondylosis without myelopathy or radiculopathy, lumbar region: Secondary | ICD-10-CM | POA: Diagnosis not present

## 2024-08-03 DIAGNOSIS — M25561 Pain in right knee: Secondary | ICD-10-CM | POA: Diagnosis not present

## 2024-08-03 DIAGNOSIS — M25562 Pain in left knee: Secondary | ICD-10-CM | POA: Diagnosis not present

## 2024-08-03 DIAGNOSIS — Z79899 Other long term (current) drug therapy: Secondary | ICD-10-CM | POA: Diagnosis not present

## 2024-08-03 DIAGNOSIS — Z5181 Encounter for therapeutic drug level monitoring: Secondary | ICD-10-CM | POA: Insufficient documentation

## 2024-08-03 DIAGNOSIS — M7062 Trochanteric bursitis, left hip: Secondary | ICD-10-CM | POA: Diagnosis not present

## 2024-08-03 DIAGNOSIS — G8929 Other chronic pain: Secondary | ICD-10-CM | POA: Insufficient documentation

## 2024-08-03 DIAGNOSIS — M5416 Radiculopathy, lumbar region: Secondary | ICD-10-CM | POA: Diagnosis not present

## 2024-08-03 MED ORDER — HYDROCODONE-ACETAMINOPHEN 5-325 MG PO TABS: 0.5000 | ORAL_TABLET | Freq: Two times a day (BID) | ORAL | 0 refills | Status: DC | PRN

## 2024-08-04 ENCOUNTER — Other Ambulatory Visit: Payer: Self-pay | Admitting: Family Medicine

## 2024-08-04 DIAGNOSIS — N393 Stress incontinence (female) (male): Secondary | ICD-10-CM

## 2024-08-04 MED ORDER — OXYBUTYNIN CHLORIDE ER 10 MG PO TB24: 10.0000 mg | ORAL_TABLET | Freq: Every day | ORAL | 1 refills | Status: AC

## 2024-08-04 NOTE — Telephone Encounter (Signed)
 Copied from CRM [id number]. Topic: Clinical - Medication Refill >> Aug 04, 2024 10:00 AM Berneda FALCON wrote: Medication: oxybutynin  (DITROPAN -XL) 10 MG 24 hr tablet  90-day supply requested  Has the patient contacted their pharmacy? Yes (Agent: If no, request that the patient contact the pharmacy for the refill. If patient does not wish to contact the pharmacy document the reason why and proceed with request.) (Agent: If yes, when and what did the pharmacy advise?)  This is the patient's preferred pharmacy:  [id number] [address], [place] - [address] AT [facility] [address] & [facility] [address] [place] [address] Phone: [phone] Fax: [phone]  Is this the correct pharmacy for this prescription? Yes If no, delete pharmacy and type the correct one.   Has the prescription been filled recently? No  Is the patient out of the medication? No  Has the patient been seen for an appointment in the last year OR does the patient have an upcoming appointment? Yes  Can we respond through MyChart? Yes  Agent: Please be advised that Rx refills may take up to 3 business days. We ask that you follow-up with your pharmacy.

## 2024-08-08 ENCOUNTER — Other Ambulatory Visit: Payer: Self-pay

## 2024-08-08 ENCOUNTER — Ambulatory Visit: Admitting: Physical Medicine and Rehabilitation

## 2024-08-08 DIAGNOSIS — M5416 Radiculopathy, lumbar region: Secondary | ICD-10-CM | POA: Diagnosis not present

## 2024-08-08 MED ORDER — METHYLPREDNISOLONE ACETATE 40 MG/ML IJ SUSP
40.0000 mg | Freq: Once | INTRAMUSCULAR | Status: AC
  Administered 2024-08-08: 40 mg

## 2024-08-08 NOTE — Procedures (Signed)
 Lumbosacral Transforaminal Epidural Steroid Injection - Sub-Pedicular Approach with Fluoroscopic Guidance  Patient: Tracey Armstrong      Date of Birth: 10-25-1934 MRN: [id number] PCP: [place], [name], MD      Visit Date: 08/08/2024   Universal Protocol:    Date/Time: 08/08/2024  Consent Given By: the patient  Position: PRONE  Additional Comments: Vital signs were monitored before and after the procedure. Patient was prepped and draped in the usual sterile fashion. The correct patient, procedure, and site was verified.   Injection Procedure Details:   Procedure diagnoses: Lumbar radiculopathy [M54.16]    Meds Administered:  Meds ordered this encounter  Medications   methylPREDNISolone  acetate (DEPO-MEDROL ) injection 40 mg    Laterality: Left  Location/Site: L4  Needle:5.0 in., 22 ga.  Short bevel or Quincke spinal needle  Needle Placement: Transforaminal  Findings:    -Comments: Excellent flow of contrast along the nerve, nerve root and into the epidural space.  Procedure Details: After squaring off the end-plates to get a true AP view, the C-arm was positioned so that an oblique view of the foramen as noted above was visualized. The target area is just inferior to the nose of the scotty dog or sub pedicular. The soft tissues overlying this structure were infiltrated with 2-3 ml. of 1% Lidocaine  without Epinephrine.  The spinal needle was inserted toward the target using a trajectory view along the fluoroscope beam.  Under AP and lateral visualization, the needle was advanced so it did not puncture dura and was located close the 6 O'Clock position of the pedical in AP tracterory. Biplanar projections were used to confirm position. Aspiration was confirmed to be negative for CSF and/or blood. A 1-2 ml. volume of Isovue -250 was injected and flow of contrast was noted at each level. Radiographs were obtained for documentation purposes.   After attaining the desired flow of  contrast documented above, a 0.5 to 1.0 ml test dose of 0.25% Marcaine  was injected into each respective transforaminal space.  The patient was observed for 90 seconds post injection.  After no sensory deficits were reported, and normal lower extremity motor function was noted,   the above injectate was administered so that equal amounts of the injectate were placed at each foramen (level) into the transforaminal epidural space.   Additional Comments:  The patient tolerated the procedure well Dressing: 2 x 2 sterile gauze and Band-Aid    Post-procedure details: Patient was observed during the procedure. Post-procedure instructions were reviewed.  Patient left the clinic in stable condition.

## 2024-08-08 NOTE — Progress Notes (Signed)
    +  Driver, -BT, -Dye Allergies.

## 2024-08-08 NOTE — Progress Notes (Signed)
 Tracey Armstrong - 89 y.o. female MRN [id number]  Date of birth: 12-01-1934  Office Visit Note: Visit Date: 08/08/2024 PCP: [place], [name], MD Referred by: [place], [name], MD  Subjective: Chief Complaint  Patient presents with   Lower Back - Pain   HPI:  Tracey Armstrong is a 89 y.o. female who comes in today at the request of Duwaine Pouch, FNP for planned Left L4-5 Lumbar Transforaminal epidural steroid injection with fluoroscopic guidance.  The patient has failed conservative care including home exercise, medications, time and activity modification.  This injection will be diagnostic and hopefully therapeutic.  Please see requesting physician notes for further details and justification.  She recently saw Megan a nurse practitioner and did comment on the fact that the last injection was not as beneficial.  In talking to her today she basically says none of the epidurals have helped which is really not true.  She is a poor historian just cannot remember over the years.  We have well-documented times where the epidural at L4 has helped her in terms of this left hip.  Her biggest complaint is left hip pain.  I going to send a message to Tory Gaskins who sees her from an orthopedic standpoint to possibly get a greater trochanteric injection if this epidural just does not help her.   ROS Otherwise per HPI.  Assessment & Plan: Visit Diagnoses:    ICD-10-CM   1. Lumbar radiculopathy  M54.16 XR C-ARM NO REPORT    Epidural Steroid injection    methylPREDNISolone  acetate (DEPO-MEDROL ) injection 40 mg      Plan: No additional findings.   Meds & Orders:  Meds ordered this encounter  Medications   methylPREDNISolone  acetate (DEPO-MEDROL ) injection 40 mg    Orders Placed This Encounter  Procedures   XR C-ARM NO REPORT   Epidural Steroid injection    Follow-up: Return for visit to requesting provider as needed.   Procedures: No procedures performed  Lumbosacral Transforaminal Epidural  Steroid Injection - Sub-Pedicular Approach with Fluoroscopic Guidance  Patient: Tracey Armstrong      Date of Birth: Mar 08, 1935 MRN: [id number] PCP: [place], [name], MD      Visit Date: 08/08/2024   Universal Protocol:    Date/Time: 08/08/2024  Consent Given By: the patient  Position: PRONE  Additional Comments: Vital signs were monitored before and after the procedure. Patient was prepped and draped in the usual sterile fashion. The correct patient, procedure, and site was verified.   Injection Procedure Details:   Procedure diagnoses: Lumbar radiculopathy [M54.16]    Meds Administered:  Meds ordered this encounter  Medications   methylPREDNISolone  acetate (DEPO-MEDROL ) injection 40 mg    Laterality: Left  Location/Site: L4  Needle:5.0 in., 22 ga.  Short bevel or Quincke spinal needle  Needle Placement: Transforaminal  Findings:    -Comments: Excellent flow of contrast along the nerve, nerve root and into the epidural space.  Procedure Details: After squaring off the end-plates to get a true AP view, the C-arm was positioned so that an oblique view of the foramen as noted above was visualized. The target area is just inferior to the nose of the scotty dog or sub pedicular. The soft tissues overlying this structure were infiltrated with 2-3 ml. of 1% Lidocaine  without Epinephrine.  The spinal needle was inserted toward the target using a trajectory view along the fluoroscope beam.  Under AP and lateral visualization, the needle was advanced so it did not puncture dura and was  located close the 6 O'Clock position of the pedical in AP tracterory. Biplanar projections were used to confirm position. Aspiration was confirmed to be negative for CSF and/or blood. A 1-2 ml. volume of Isovue -250 was injected and flow of contrast was noted at each level. Radiographs were obtained for documentation purposes.   After attaining the desired flow of contrast documented above, a 0.5 to  1.0 ml test dose of 0.25% Marcaine  was injected into each respective transforaminal space.  The patient was observed for 90 seconds post injection.  After no sensory deficits were reported, and normal lower extremity motor function was noted,   the above injectate was administered so that equal amounts of the injectate were placed at each foramen (level) into the transforaminal epidural space.   Additional Comments:  The patient tolerated the procedure well Dressing: 2 x 2 sterile gauze and Band-Aid    Post-procedure details: Patient was observed during the procedure. Post-procedure instructions were reviewed.  Patient left the clinic in stable condition.    Clinical History: CLINICAL DATA:  Lumbar compression fracture   EXAM: CT LUMBAR SPINE WITHOUT CONTRAST   TECHNIQUE: Multidetector CT imaging of the lumbar spine was performed without intravenous contrast administration. Multiplanar CT image reconstructions were also generated.   RADIATION DOSE REDUCTION: This exam was performed according to the departmental dose-optimization program which includes automated exposure control, adjustment of the mA and/or kV according to patient size and/or use of iterative reconstruction technique.   COMPARISON:  04/04/2020 lumbar spine CT   09/07/2022 lumbar spine radiographs   FINDINGS: Segmentation: Standard   Alignment: Unchanged grade 1 anterolisthesis at L4-5   Vertebrae: Progression of height loss at L1 vertebral fracture now with greater than 75% central height loss. There is 5 mm retropulsion of the posterosuperior corner. The acuity of the advanced height loss is unclear.   Paraspinal and other soft tissues: Calcific aortic atherosclerosis. No perivertebral hematoma.   Disc levels: Mild spinal canal stenosis at the L1 level, due to above described retropulsion.   There is mild spinal canal stenosis at L4-5, unchanged   IMPRESSION: 1. Progression of height loss at L1  vertebral fracture now with greater than 75% central height loss and 5 mm retropulsion of the posterosuperior corner. The acuity of the advanced height loss is unclear, but is unchanged since 09/07/2022. 2. Unchanged mild spinal canal stenosis at L4-5.   Aortic Atherosclerosis (ICD10-I70.0).     Electronically Signed   By: Franky Stanford M.D.   On: 09/19/2022 02:46     Objective:  VS:  HT:    WT:   BMI:     BP:   HR: bpm  TEMP: ( )  RESP:  Physical Exam Vitals and nursing note reviewed.  Constitutional:      General: She is not in acute distress.    Appearance: Normal appearance. She is not ill-appearing.  HENT:     Head: Normocephalic and atraumatic.     Right Ear: External ear normal.     Left Ear: External ear normal.  Eyes:     Extraocular Movements: Extraocular movements intact.  Cardiovascular:     Rate and Rhythm: Normal rate.     Pulses: Normal pulses.  Pulmonary:     Effort: Pulmonary effort is normal. No respiratory distress.  Abdominal:     General: There is no distension.     Palpations: Abdomen is soft.  Musculoskeletal:        General: Tenderness present.  Cervical back: Neck supple.     Right lower leg: No edema.     Left lower leg: No edema.     Comments: Patient has good distal strength with no pain over the greater trochanters.  No clonus or focal weakness.  Skin:    Findings: No erythema, lesion or rash.  Neurological:     General: No focal deficit present.     Mental Status: She is alert and oriented to person, place, and time.     Sensory: No sensory deficit.     Motor: No weakness or abnormal muscle tone.     Coordination: Coordination normal.     Gait: Gait abnormal.  Psychiatric:        Mood and Affect: Mood normal.        Behavior: Behavior normal.      Imaging: No results found.

## 2024-08-10 ENCOUNTER — Other Ambulatory Visit: Payer: Self-pay

## 2024-08-10 ENCOUNTER — Encounter: Payer: Self-pay | Admitting: Physician Assistant

## 2024-08-10 ENCOUNTER — Ambulatory Visit (INDEPENDENT_AMBULATORY_CARE_PROVIDER_SITE_OTHER): Admitting: Physician Assistant

## 2024-08-10 DIAGNOSIS — M1711 Unilateral primary osteoarthritis, right knee: Secondary | ICD-10-CM

## 2024-08-10 DIAGNOSIS — G8929 Other chronic pain: Secondary | ICD-10-CM | POA: Diagnosis not present

## 2024-08-10 DIAGNOSIS — M170 Bilateral primary osteoarthritis of knee: Secondary | ICD-10-CM | POA: Diagnosis not present

## 2024-08-10 DIAGNOSIS — M1712 Unilateral primary osteoarthritis, left knee: Secondary | ICD-10-CM | POA: Diagnosis not present

## 2024-08-10 DIAGNOSIS — M25562 Pain in left knee: Secondary | ICD-10-CM

## 2024-08-10 DIAGNOSIS — M7062 Trochanteric bursitis, left hip: Secondary | ICD-10-CM

## 2024-08-10 DIAGNOSIS — M25561 Pain in right knee: Secondary | ICD-10-CM | POA: Diagnosis not present

## 2024-08-10 MED ORDER — METHYLPREDNISOLONE ACETATE 40 MG/ML IJ SUSP
40.0000 mg | INTRAMUSCULAR | Status: AC | PRN
  Administered 2024-08-10: 40 mg via INTRA_ARTICULAR

## 2024-08-10 MED ORDER — LIDOCAINE HCL 1 % IJ SOLN
3.0000 mL | INTRAMUSCULAR | Status: AC | PRN
  Administered 2024-08-10: 3 mL

## 2024-08-10 NOTE — Progress Notes (Signed)
 Office Visit Note   Patient: Tracey Armstrong           Date of Birth: 1935-01-26           MRN: [id number] Visit Date: 08/10/2024              Requested by: [place], [name], MD [address] [place],  [place] [address] PCP: [place], [name], MD   Assessment & Plan: Visit Diagnoses:  1. Chronic pain of right knee   2. Chronic pain of left knee     Plan: She will follow-up with us  as needed she knows she needs to wait a full 3 months but had it for having another trochanteric injection.  In regards to her knees she needs to wait 3 months between injections.  Questions were encouraged and answered.  Follow-Up Instructions: Return if symptoms worsen or fail to improve.   Orders:  Orders Placed This Encounter  Procedures   Large Joint Inj   Large Joint Inj   XR Knee 1-2 Views Right   XR Knee 1-2 Views Left   No orders of the defined types were placed in this encounter.     Procedures: Large Joint Inj: L knee on 08/10/2024 12:53 PM Indications: pain Details: 22 G 1.5 in needle, anterolateral approach  Arthrogram: No  Medications: 3 mL lidocaine  1 %; 40 mg methylPREDNISolone  acetate 40 MG/ML Outcome: tolerated well, no immediate complications Procedure, treatment alternatives, risks and benefits explained, specific risks discussed. Consent was given by the patient. Immediately prior to procedure a time out was called to verify the correct patient, procedure, equipment, support staff and site/side marked as required. Patient was prepped and draped in the usual sterile fashion.    Large Joint Inj: L greater trochanter on 08/10/2024 12:53 PM Indications: pain Details: 22 G 1.5 in needle, lateral approach  Arthrogram: No  Medications: 3 mL lidocaine  1 %; 40 mg methylPREDNISolone  acetate 40 MG/ML Outcome: tolerated well, no immediate complications Procedure, treatment alternatives, risks and benefits explained, specific risks discussed. Consent was given by the patient.  Immediately prior to procedure a time out was called to verify the correct patient, procedure, equipment, support staff and site/side marked as required. Patient was prepped and draped in the usual sterile fashion.       Clinical Data: No additional findings.   Subjective: Chief Complaint  Patient presents with   Right Knee - Pain   Left Knee - Pain    HPI Tracey Armstrong comes in today with bilateral knee pain and left hip pain.  She states that the left hip and knee are most painful.  No recent injuries.  Having pain left hip when lying on the hip at night.  Recently underwent ESI's by Dr. Eldonna states she really has not gotten much relief.  Review of Systems Negative for fevers chills.  Objective: Vital Signs: There were no vitals taken for this visit.  Physical Exam Constitutional:      Appearance: She is not ill-appearing or diaphoretic.  Pulmonary:     Effort: Pulmonary effort is normal.  Neurological:     Mental Status: She is alert.     Ortho Exam Bilateral knees: Good range of motion.  Valgus deformity bilaterally no abnormal warmth erythema.  Crepitus both knees with passive range of motion.  Left hip: Good range of motion tenderness over the trochanteric region.  Specialty Comments:  CLINICAL DATA:  Lumbar compression fracture   EXAM: CT LUMBAR SPINE WITHOUT CONTRAST  TECHNIQUE: Multidetector CT imaging of the lumbar spine was performed without intravenous contrast administration. Multiplanar CT image reconstructions were also generated.   RADIATION DOSE REDUCTION: This exam was performed according to the departmental dose-optimization program which includes automated exposure control, adjustment of the mA and/or kV according to patient size and/or use of iterative reconstruction technique.   COMPARISON:  04/04/2020 lumbar spine CT   09/07/2022 lumbar spine radiographs   FINDINGS: Segmentation: Standard   Alignment: Unchanged grade 1  anterolisthesis at L4-5   Vertebrae: Progression of height loss at L1 vertebral fracture now with greater than 75% central height loss. There is 5 mm retropulsion of the posterosuperior corner. The acuity of the advanced height loss is unclear.   Paraspinal and other soft tissues: Calcific aortic atherosclerosis. No perivertebral hematoma.   Disc levels: Mild spinal canal stenosis at the L1 level, due to above described retropulsion.   There is mild spinal canal stenosis at L4-5, unchanged   IMPRESSION: 1. Progression of height loss at L1 vertebral fracture now with greater than 75% central height loss and 5 mm retropulsion of the posterosuperior corner. The acuity of the advanced height loss is unclear, but is unchanged since 09/07/2022. 2. Unchanged mild spinal canal stenosis at L4-5.   Aortic Atherosclerosis (ICD10-I70.0).     Electronically Signed   By: Franky Stanford M.D.   On: 09/19/2022 02:46  Imaging: XR Knee 1-2 Views Left Result Date: 08/10/2024 Left knee: Distal femur status post IM nailing with multiple screws.  The distal one fourth shaft fracture is well-healed.  Lateral joint line with periarticular spurring.  Mild to moderate patellofemoral changes.  XR Knee 1-2 Views Right Result Date: 08/10/2024 Right knee 2 views: Moderate narrowing lateral joint line.  Mild patellofemoral arthritic changes.  Otherwise no acute fractures acute findings.    PMFS History: Patient Active Problem List   Diagnosis Date Noted   Urge incontinence of urine 02/11/2023   Xerosis of skin 02/11/2023   Lumbar facet arthropathy 11/05/2022   Seizures (HCC) 11/05/2022   Personal history of fall 11/05/2022   Unilateral primary osteoarthritis, left knee 04/14/2022   Myalgia due to statin 02/26/2022   Atherosclerosis of aorta 07/25/2021   Urinary tract infection due to ESBL Klebsiella 04/13/2021   Acute metabolic encephalopathy 04/09/2021   Hypertensive urgency 04/09/2021   Lactic  acidosis 04/09/2021   Lab test positive for detection of COVID-19 virus 04/09/2021   Abnormal urinalysis 04/09/2021   Lumbar radiculopathy 02/06/2021   Pes anserinus bursitis of left knee 09/03/2020   Trochanteric bursitis of left hip 09/03/2020   Closed intertrochanteric fracture of left femur (HCC) 07/18/2020   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   Synovial cyst of lumbar facet joint 04/05/2020   Spinal stenosis of lumbar region with neurogenic claudication 04/05/2020   Dehydration 04/05/2020   Closed wedge compression fracture of L1 vertebra (HCC) 04/04/2020   Ambulatory dysfunction 04/04/2020   Intractable low back pain 04/04/2020   L1 vertebral fracture (HCC) 04/04/2020   Urine, incontinence, stress female 02/07/2019   Knee osteoarthritis 02/01/2018   Iron deficiency anemia 10/28/2017   Rhinitis, allergic 07/27/2017   GERD without esophagitis 06/16/2017   Chronic pain disorder 06/16/2017   Vitamin D  deficiency, unspecified 05/25/2017   Hyperlipidemia 05/25/2017   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   Pain 02/26/2017   Chronic low back pain 12/02/2016   Abdominal pain 03/26/2016   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   Peptic esophagitis 03/26/2016   Neuralgia neuritis, sciatic nerve 03/26/2016  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   Chronic insomnia 03/30/2015   Chronic migraine 01/10/2015   Depression 04/20/2014   Generalized anxiety disorder 04/20/2014   Chronic back pain 04/20/2014   Chronic daily headache 04/20/2014   Essential hypertension 04/20/2014   Migraine headache 04/19/2014   Past Medical History:  Diagnosis Date   Anxiety    Arthritis    Chronic insomnia 03/30/2015   Chronic low back pain 12/02/2016   Depression    GERD (gastroesophageal reflux disease)    Hiatal hernia    High cholesterol    Hypertelorism    Hypertension    Meningioma (HCC)    Migraine    Seizures (HCC)     Family History  Problem Relation Age of Onset   Heart  Problems Mother    Gallbladder disease Mother    Migraines Father    Stroke Father    Epilepsy Brother    Cancer Brother 98       unknown type - metastatic   Lung cancer Brother    Heart attack Maternal Grandfather    Heart attack Paternal Grandfather     Past Surgical History:  Procedure Laterality Date   FEMUR IM NAIL Left 06/02/2020   Procedure: INTRAMEDULLARY (IM) RETROGRADE FEMORAL NAILING - LEFT;  Surgeon: Fidel Rogue, MD;  Location: [facility] OR;  Service: Orthopedics;  Laterality: Left;   STOMACH SURGERY     Social History   Occupational History   Occupation: Retired  Tobacco Use   Smoking status: Never   Smokeless tobacco: Never  Vaping Use   Vaping status: Never Used  Substance and Sexual Activity   Alcohol  use: No   Drug use: No   Sexual activity: Not Currently

## 2024-08-18 DIAGNOSIS — N952 Postmenopausal atrophic vaginitis: Secondary | ICD-10-CM | POA: Diagnosis not present

## 2024-08-18 DIAGNOSIS — N3090 Cystitis, unspecified without hematuria: Secondary | ICD-10-CM | POA: Diagnosis not present

## 2024-08-22 ENCOUNTER — Encounter: Payer: Self-pay | Admitting: Radiology

## 2024-08-26 NOTE — Progress Notes (Signed)
 Chief Complaint: Discuss EGD for dysphagia  HPI:    Tracey Armstrong is an 89 year old female, assigned to Dr. Shila at last visit, with a past medical history as listed below including depression, GERD and multiple others, who returns to clinic today for follow-up of dysphagia.    2004 colonoscopy with Dr. Rosalie.  Cannot see report.    04/08/2021 echo with LVEF 60-65% with grade 1 diastolic dysfunction.    11/11/2023 CMP was normal.  Vitamin D  normal.    05/30/2024 patient followed with orthopedics in regards to left hip pain.  She was given a injection of methylprednisolone .    06/23/24 patient seen in clinic by me for diarrhea, dysphagia and epigastric pain.  At that time discussed history of likely perforated gastric ulcer years ago with surgery in [facility] in 2015, change in bowel habits to little pieces.  Possible UTI.  At that time ordered an esophagram and tablet and consider EGD pending results.  Increase Omeprazole  to 40 twice daily.  Recommended fiber supplement and MiraLAX .  Urinalysis done with culture.  Assigned to Dr. Shila.    06/28/2024 urinalysis showed white blood cells and patient started Macrobid  100 mg p.o. twice daily for 5 days.    07/11/2024 esophagram was limited due to patient mobility, focal stricture of the proximal esophagus near the thoracic inlet suggesting esophageal web.  Prevented passage of a 13 mm barium tablet.  Postsurgical changes in distal esophagus suggesting previous Nissen fundoplication moderate esophageal dysmotility.    Today, patient presents to clinic companied by her daughter.  They explained that her swallowing difficulties have gotten a lot better since increasing her Omeprazole  to 40 mg daily and she would like to stay on that.  Does tell me though that she still has issues where things get hung and she has to regurgitate them or vomit them out.  She feels like she wants to get this fixed since she is only getting older.  Denies abdominal pain or weight  loss.    Denies fever or chills.  Past Medical History:  Diagnosis Date   Anxiety    Arthritis    Chronic insomnia 03/30/2015   Chronic low back pain 12/02/2016   Depression    GERD (gastroesophageal reflux disease)    Hiatal hernia    High cholesterol    Hypertelorism    Hypertension    Meningioma (HCC)    Migraine    Seizures (HCC)     Past Surgical History:  Procedure Laterality Date   FEMUR IM NAIL Left 06/02/2020   Procedure: INTRAMEDULLARY (IM) RETROGRADE FEMORAL NAILING - LEFT;  Surgeon: Fidel Rogue, MD;  Location: [facility] OR;  Service: Orthopedics;  Laterality: Left;   STOMACH SURGERY      Current Outpatient Medications  Medication Sig Dispense Refill   ALPRAZolam  (XANAX ) 1 MG tablet TAKE 1/2 TO 1 TABLET(0.5 TO 1 MG) BY MOUTH TWICE DAILY AS NEEDED FOR ANXIETY 45 tablet 3   ammonium lactate  (LAC-HYDRIN ) 12 % lotion Apply 1 Application topically daily as needed for dry skin (on heels). 222 g 0   antiseptic oral rinse (BIOTENE) LIQD Use 3 mls 3-4 times per day for dry mouth. Do not swallow. 237 mL 0   Cholecalciferol  (VITAMIN D3) 1.25 MG (50000 UT) CAPS TAKE 1 CAPSULE BY MOUTH EVERY 14 DAYS 7 capsule 3   Dextran 70-Hypromellose (ARTIFICIAL TEARS PF OP) Place 1-2 drops into both eyes See admin instructions. Instill 1-2 drops into both eyes at bedtime and an  additional 2-3 times a day as needed for dryness     diclofenac  (FLECTOR ) 1.3 % PTCH Place 1 patch onto the skin 2 (two) times daily. 60 patch 1   diphenhydramine-acetaminophen  (TYLENOL  PM) 25-500 MG TABS tablet Take 1.5 tablets by mouth at bedtime.     gabapentin  (NEURONTIN ) 300 MG capsule Take 1 capsule twice a day 60 capsule 11   HYDROcodone -acetaminophen  (NORCO/VICODIN) 5-325 MG tablet Take 0.5-1 tablets by mouth 2 (two) times daily as needed. 45 tablet 0   levETIRAcetam  (KEPPRA ) 500 MG tablet Take 1 tablet twice a day 60 tablet 11   Magnesium  400 MG TABS Take 400 mg by mouth at bedtime.     MAXALT -MLT 10 MG  disintegrating tablet DISSOLVE ONE TABLET BY MOUTH AS NEEDED HEADACHE. 9 tablet 11   Melatonin 10 MG TABS Take 10 mg by mouth at bedtime as needed (for sleep).     omeprazole  (PRILOSEC OTC) 20 MG tablet Take 20 mg by mouth daily before breakfast.     omeprazole  (PRILOSEC) 40 MG capsule Take 1 capsule (40 mg total) by mouth daily. 30 capsule 3   oxybutynin  (DITROPAN -XL) 10 MG 24 hr tablet Take 1 tablet (10 mg total) by mouth at bedtime. 90 tablet 1   POTASSIUM PO Take 1 tablet by mouth daily with lunch.     pravastatin  (PRAVACHOL ) 20 MG tablet TAKE 1 TABLET(20 MG) BY MOUTH 3 TIMES A WEEK 13 tablet 5   pyridOXINE  (VITAMIN B-6) 100 MG tablet Take 400 mg by mouth daily with lunch.     riboflavin  (VITAMIN B-2) 100 MG TABS tablet Take 400 mg by mouth daily with lunch.     tretinoin  (RETIN-A ) 0.1 % cream APPLY TO AFFECTED AREA EVERY DAY AT BEDTIME 45 g 2   verapamil  (VERELAN ) 180 MG 24 hr capsule TAKE 1 CAPSULE(180 MG) BY MOUTH DAILY 90 capsule 3   No current facility-administered medications for this visit.    Allergies as of 08/29/2024 - Review Complete 08/10/2024  Allergen Reaction Noted   Dihydrotachysterol Other (See Comments) 03/29/2015   Trazodone and nefazodone Other (See Comments) 03/29/2015   Valium [diazepam] Other (See Comments) 03/26/2016   Citalopram Other (See Comments) 03/16/2017   Other Other (See Comments) 06/08/2017    Family History  Problem Relation Age of Onset   Heart Problems Mother    Gallbladder disease Mother    Migraines Father    Stroke Father    Epilepsy Brother    Cancer Brother 58       unknown type - metastatic   Lung cancer Brother    Heart attack Maternal Grandfather    Heart attack Paternal Grandfather     Social History   Socioeconomic History   Marital status: Widowed    Spouse name: Not on file   Number of children: 4   Years of education: 56   Highest education level: Not on file  Occupational History   Occupation: Retired  Tobacco Use    Smoking status: Never   Smokeless tobacco: Never  Vaping Use   Vaping status: Never Used  Substance and Sexual Activity   Alcohol  use: No   Drug use: No   Sexual activity: Not Currently  Other Topics Concern   Not on file  Social History Narrative   Lives in [facility]      Patient drinks 1 glass of tea daily.      Patient is right handed.   Social Drivers of Health  Financial Resource Strain: Low Risk  (11/30/2023)   Overall Financial Resource Strain (CARDIA)    Difficulty of Paying Living Expenses: Not hard at all  Food Insecurity: No Food Insecurity (11/30/2023)   Hunger Vital Sign    Worried About Running Out of Food in the Last Year: Never true    Ran Out of Food in the Last Year: Never true  Transportation Needs: No Transportation Needs (11/30/2023)   PRAPARE - Administrator, Civil Service (Medical): No    Lack of Transportation (Non-Medical): No  Physical Activity: Insufficiently Active (11/30/2023)   Exercise Vital Sign    Days of Exercise per Week: 2 days    Minutes of Exercise per Session: 60 min  Stress: No Stress Concern Present (11/30/2023)   [employer] of Occupational Health - Occupational Stress Questionnaire    Feeling of Stress : Not at all  Social Connections: Moderately Integrated (11/30/2023)   Social Connection and Isolation Panel    Frequency of Communication with Friends and Family: More than three times a week    Frequency of Social Gatherings with Friends and Family: More than three times a week    Attends Religious Services: More than 4 times per year    Active Member of [employer] or Organizations: Yes    Attends Banker Meetings: More than 4 times per year    Marital Status: Widowed  Intimate Partner Violence: Not At Risk (11/30/2023)   Humiliation, Afraid, Rape, and Kick questionnaire    Fear of Current or Ex-Partner: No    Emotionally Abused: No    Physically Abused: No    Sexually Abused: No    Review  of Systems:    Constitutional: No weight loss, fever or chills Cardiovascular: No chest pain Respiratory: No SOB  Gastrointestinal: See HPI and otherwise negative   Physical Exam:  Vital signs: BP 104/60   Pulse 67   Ht 5' 1 (1.549 m)   Wt 134 lb (60.8 kg)   SpO2 97%   BMI 25.32 kg/m    Constitutional:   Pleasant Elderly Caucasian female appears to be in NAD, Well developed, Well nourished, alert and cooperative Respiratory: Respirations even and unlabored. Lungs clear to auscultation bilaterally.   No wheezes, crackles, or rhonchi.  Cardiovascular: Normal S1, S2. No MRG. Regular rate and rhythm. No peripheral edema, cyanosis or pallor.  Gastrointestinal:  Soft, nondistended, nontender. No rebound or guarding. Normal bowel sounds. No appreciable masses or hepatomegaly. Rectal:  Not performed.  Msk:  Symmetrical without gross deformities. Without edema, no deformity or joint abnormality. +ambulates with walker Psychiatric: Demonstrates good judgement and reason without abnormal affect or behaviors.  RELEVANT LABS AND IMAGING: CBC    Component Value Date/Time   WBC 5.9 10/28/2021 1030   RBC 4.14 10/28/2021 1030   HGB 13.1 10/28/2021 1030   HGB 11.7 02/26/2017 1506   HCT 39.9 10/28/2021 1030   HCT 35.0 02/26/2017 1506   PLT 248.0 10/28/2021 1030   MCV 96.5 10/28/2021 1030   MCV 89 02/26/2017 1506   MCH 32.8 05/02/2021 1634   MCHC 32.9 10/28/2021 1030   RDW 13.2 10/28/2021 1030   RDW 13.8 02/26/2017 1506   LYMPHSABS 2.0 05/02/2021 1634   LYMPHSABS 1.5 02/26/2017 1506   MONOABS 0.3 05/02/2021 1634   EOSABS 0.1 05/02/2021 1634   EOSABS 0.0 02/26/2017 1506   BASOSABS 0.0 05/02/2021 1634   BASOSABS 0.0 02/26/2017 1506    CMP     Component Value Date/Time  NA 140 11/11/2023 1039   NA 134 02/26/2017 1506   K 4.3 11/11/2023 1039   CL 103 11/11/2023 1039   CO2 28 11/11/2023 1039   GLUCOSE 90 11/11/2023 1039   BUN 13 11/11/2023 1039   BUN 11 02/26/2017 1506    CREATININE 0.93 11/11/2023 1039   CREATININE 1.00 (H) 09/05/2020 0855   CALCIUM 9.6 11/11/2023 1039   PROT 6.9 11/11/2023 1039   PROT 6.7 02/26/2017 1506   ALBUMIN 4.1 11/11/2023 1039   ALBUMIN 4.2 02/26/2017 1506   AST 20 11/11/2023 1039   ALT 17 11/11/2023 1039   ALKPHOS 60 11/11/2023 1039   BILITOT 0.3 11/11/2023 1039   BILITOT 0.2 02/26/2017 1506   GFRNONAA 50 (L) 05/02/2021 1634   GFRNONAA 52 (L) 09/05/2020 0855   GFRAA 60 09/05/2020 0855    Assessment: 1.  Dysphagia: Recent esophagram showing esophageal web and recommendations for an EGD, continues with some difficulty swallowing though improved on Omeprazole  40 mg daily 2.  GERD: Better with Omeprazole  40 mg daily  Plan: 1.  Patient ambulates with a walker, but no evidence of aortic stenosis and does not meet any other criteria to have her procedure done at the hospital.  Will schedule her for an EGD with dilation in the [facility] with Dr. Nandigam.  Did provide the patient a detailed list of risks for the procedure and she agrees to proceed. 2.  Continue Omeprazole  40 mg daily, refilled #90 with 3 refills 3.  Patient to follow in clinic per recommendations after time of procedure.  Delon Failing, PA-C [facility] 08/26/2024, 10:02 AM  Cc: Jordan, [name], MD

## 2024-08-29 ENCOUNTER — Encounter: Payer: Self-pay | Admitting: Physician Assistant

## 2024-08-29 ENCOUNTER — Ambulatory Visit: Admitting: Physician Assistant

## 2024-08-29 VITALS — BP 104/60 | HR 67 | Ht 61.0 in | Wt 134.0 lb

## 2024-08-29 DIAGNOSIS — R1310 Dysphagia, unspecified: Secondary | ICD-10-CM | POA: Diagnosis not present

## 2024-08-29 DIAGNOSIS — K219 Gastro-esophageal reflux disease without esophagitis: Secondary | ICD-10-CM

## 2024-08-29 DIAGNOSIS — R933 Abnormal findings on diagnostic imaging of other parts of digestive tract: Secondary | ICD-10-CM | POA: Diagnosis not present

## 2024-08-29 NOTE — Patient Instructions (Addendum)
 We have sent the following medications to your pharmacy for you to pick up at your convenience: Omeprazole  40 mg daily 30-60 minutes before breakfast.   You have been scheduled for an endoscopy. Please follow written instructions given to you at your visit today.  If you use inhalers (even only as needed), please bring them with you on the day of your procedure.  If you take any of the following medications, they will need to be adjusted prior to your procedure:   DO NOT TAKE 7 DAYS PRIOR TO TEST- Trulicity (dulaglutide) Ozempic, Wegovy (semaglutide) Mounjaro, Zepbound (tirzepatide) Bydureon Bcise (exanatide extended release)  DO NOT TAKE 1 DAY PRIOR TO YOUR TEST Rybelsus (semaglutide) Adlyxin (lixisenatide) Victoza (liraglutide) Byetta (exanatide) ___________________________________________________________________________

## 2024-09-06 ENCOUNTER — Other Ambulatory Visit: Payer: Self-pay | Admitting: *Deleted

## 2024-09-06 MED ORDER — OMEPRAZOLE 40 MG PO CPDR: 40.0000 mg | DELAYED_RELEASE_CAPSULE | Freq: Every day | ORAL | 3 refills | Status: AC

## 2024-09-07 ENCOUNTER — Encounter: Payer: Self-pay | Admitting: Neurology

## 2024-09-20 ENCOUNTER — Other Ambulatory Visit: Payer: Self-pay | Admitting: Family Medicine

## 2024-09-20 DIAGNOSIS — I700 Atherosclerosis of aorta: Secondary | ICD-10-CM

## 2024-10-03 ENCOUNTER — Encounter: Attending: Physical Medicine and Rehabilitation | Admitting: Registered Nurse

## 2024-10-03 ENCOUNTER — Encounter: Payer: Self-pay | Admitting: Registered Nurse

## 2024-10-03 VITALS — BP 104/62 | HR 63 | Ht 61.0 in | Wt 148.0 lb

## 2024-10-03 DIAGNOSIS — M47816 Spondylosis without myelopathy or radiculopathy, lumbar region: Secondary | ICD-10-CM

## 2024-10-03 DIAGNOSIS — M25561 Pain in right knee: Secondary | ICD-10-CM | POA: Diagnosis not present

## 2024-10-03 DIAGNOSIS — M25562 Pain in left knee: Secondary | ICD-10-CM | POA: Insufficient documentation

## 2024-10-03 DIAGNOSIS — G894 Chronic pain syndrome: Secondary | ICD-10-CM | POA: Diagnosis present

## 2024-10-03 DIAGNOSIS — Z79891 Long term (current) use of opiate analgesic: Secondary | ICD-10-CM

## 2024-10-03 DIAGNOSIS — M7062 Trochanteric bursitis, left hip: Secondary | ICD-10-CM

## 2024-10-03 DIAGNOSIS — Z79899 Other long term (current) drug therapy: Secondary | ICD-10-CM | POA: Insufficient documentation

## 2024-10-03 DIAGNOSIS — M5416 Radiculopathy, lumbar region: Secondary | ICD-10-CM | POA: Insufficient documentation

## 2024-10-03 DIAGNOSIS — G8929 Other chronic pain: Secondary | ICD-10-CM | POA: Diagnosis present

## 2024-10-03 DIAGNOSIS — Z5181 Encounter for therapeutic drug level monitoring: Secondary | ICD-10-CM | POA: Insufficient documentation

## 2024-10-03 MED ORDER — HYDROCODONE-ACETAMINOPHEN 5-325 MG PO TABS: 0.5000 | ORAL_TABLET | Freq: Two times a day (BID) | ORAL | 0 refills | Status: DC | PRN

## 2024-10-03 MED ORDER — HYDROCODONE-ACETAMINOPHEN 5-325 MG PO TABS: 0.5000 | ORAL_TABLET | Freq: Two times a day (BID) | ORAL | 0 refills | Status: AC | PRN

## 2024-10-03 NOTE — Progress Notes (Unsigned)
 Subjective:    Patient ID: Tracey Armstrong, female    DOB: [birth year], 89 y.o.   MRN: [id number]  HPI: Tracey Armstrong is a 89 y.o. female who returns for follow up appointment for chronic pain and medication refill. states *** pain is located in  ***. rates pain ***. current exercise regime is walking and performing stretching exercises.  [name] Morphine  equivalent is *** MME.   Oral Swab was Performed today.      Pain Inventory Average Pain 7 Pain Right Now 8 My pain is aching  In the last 24 hours, has pain interfered with the following? General activity 7 Relation with others 7 Enjoyment of life 7 What TIME of day is your pain at its worst? evening Sleep (in general) Good  Pain is worse with: walking, bending, and inactivity Pain improves with: medication and injections Relief from Meds: 9  Family History  Problem Relation Age of Onset   Heart Problems Mother    Gallbladder disease Mother    Migraines Father    Stroke Father    Epilepsy Brother    Cancer Brother 24       unknown type - metastatic   Lung cancer Brother    Heart attack Maternal Grandfather    Heart attack Paternal Grandfather    Social History   Socioeconomic History   Marital status: Widowed    Spouse name: Not on file   Number of children: 4   Years of education: 13   Highest education level: Not on file  Occupational History   Occupation: Retired  Tobacco Use   Smoking status: Never   Smokeless tobacco: Never  Vaping Use   Vaping status: Never Used  Substance and Sexual Activity   Alcohol  use: No   Drug use: No   Sexual activity: Not Currently  Other Topics Concern   Not on file  Social History Narrative   Lives in [facility]      Patient drinks 1 glass of tea daily.      Patient is right handed.   Social Drivers of Health   Tobacco Use: Low Risk (08/29/2024)   Patient History    Smoking Tobacco Use: Never    Smokeless Tobacco Use: Never    Passive Exposure: Not  on file  Financial Resource Strain: Low Risk (11/30/2023)   Overall Financial Resource Strain (CARDIA)    Difficulty of Paying Living Expenses: Not hard at all  Food Insecurity: No Food Insecurity (11/30/2023)   Hunger Vital Sign    Worried About Running Out of Food in the Last Year: Never true    Ran Out of Food in the Last Year: Never true  Transportation Needs: No Transportation Needs (11/30/2023)   PRAPARE - Administrator, Civil Service (Medical): No    Lack of Transportation (Non-Medical): No  Physical Activity: Insufficiently Active (11/30/2023)   Exercise Vital Sign    Days of Exercise per Week: 2 days    Minutes of Exercise per Session: 60 min  Stress: No Stress Concern Present (11/30/2023)   [employer] of Occupational Health - Occupational Stress Questionnaire    Feeling of Stress : Not at all  Social Connections: Moderately Integrated (11/30/2023)   Social Connection and Isolation Panel    Frequency of Communication with Friends and Family: More than three times a week    Frequency of Social Gatherings with Friends and Family: More than three times a week    Attends Religious  Services: More than 4 times per year    Active Member of Clubs or Organizations: Yes    Attends Banker Meetings: More than 4 times per year    Marital Status: Widowed  Depression (PHQ2-9): Low Risk (06/01/2024)   Depression (PHQ2-9)    PHQ-2 Score: 2  Alcohol  Screen: Low Risk (11/30/2023)   Alcohol  Screen    Last Alcohol  Screening Score (AUDIT): 0  Housing: Unknown (11/30/2023)   Housing Stability Vital Sign    Unable to Pay for Housing in the Last Year: No    Number of Times Moved in the Last Year: Not on file    Homeless in the Last Year: No  Utilities: Not At Risk (11/30/2023)   AHC Utilities    Threatened with loss of utilities: No  Health Literacy: Adequate Health Literacy (11/30/2023)   B1300 Health Literacy    Frequency of need for help with medical instructions:  Never   Past Surgical History:  Procedure Laterality Date   FEMUR IM NAIL Left 06/02/2020   Procedure: INTRAMEDULLARY (IM) RETROGRADE FEMORAL NAILING - LEFT;  Surgeon: Fidel Rogue, MD;  Location: [facility] OR;  Service: Orthopedics;  Laterality: Left;   STOMACH SURGERY     Past Surgical History:  Procedure Laterality Date   FEMUR IM NAIL Left 06/02/2020   Procedure: INTRAMEDULLARY (IM) RETROGRADE FEMORAL NAILING - LEFT;  Surgeon: Fidel Rogue, MD;  Location: [facility] OR;  Service: Orthopedics;  Laterality: Left;   STOMACH SURGERY     Past Medical History:  Diagnosis Date   Anxiety    Arthritis    Chronic insomnia 03/30/2015   Chronic low back pain 12/02/2016   Depression    GERD (gastroesophageal reflux disease)    Hiatal hernia    High cholesterol    Hypertelorism    Hypertension    Meningioma (HCC)    Migraine    Seizures (HCC)    There were no vitals taken for this visit.  Opioid Risk Score:   Fall Risk Score:  `1  Depression screen [facility] 2/9     06/01/2024    9:58 AM 06/01/2024    9:46 AM 03/30/2024   10:17 AM 11/30/2023   11:39 AM 06/24/2023   11:05 AM 03/27/2023   10:59 AM 02/23/2023   11:07 AM  Depression screen PHQ 2/9  Decreased Interest 1 0 0 0 0 0 1  Down, Depressed, Hopeless 1 0 0 0 0 0 1  PHQ - 2 Score 2 0 0 0 0 0 2  Altered sleeping   0      Tired, decreased energy   0      Change in appetite   0      Feeling bad or failure about yourself    0      Trouble concentrating   0      Moving slowly or fidgety/restless   0      Suicidal thoughts   0      PHQ-9 Score   0          Data saved with a previous flowsheet row definition    Review of Systems  Musculoskeletal:  Positive for back pain and gait problem.       Pain in both shoulders, upper left leg  down to left knee, & arms  All other systems reviewed and are negative.      Objective:   Physical Exam        Assessment & Plan:  Lumbar  Radiculitis: S/P on 03/25/2023: Dr Eldonna : Lumbosacral  Transforaminal Epidural Steroid Injection - Sub-Pedicular Approach with Fluoroscopic Guidance , with Good relief noted. 08/03/2024 2. Lumbar Facet Arthropathy: Continue HEP as tolerated. Continue current medication regimen. Continue to Monitor. 08/03/2024 3. Chronic Pain Syndrome: Refilled: Hydrocodone  5 mg 0.5-1 tablet twice a day as needed for pain #45. We will continue the opioid monitoring program, this consists of regular clinic visits, examinations, urine drug screen, pill counts as well as use of [place]  Controlled Substance Reporting system. A 12 month History has been reviewed on the [place]  Controlled Substance Reporting System on 08/03/2024 Continue current medication regimen.  4. Left Greater Trochanter Tenderness: Continue HEP as Tolerated. Continue to Monitor. 5. Bilateral Chronic Knee Pain: Ortho Following: Continue to monitor.    F/U with 2 months

## 2024-10-05 ENCOUNTER — Ambulatory Visit: Admitting: Gastroenterology

## 2024-10-05 ENCOUNTER — Encounter: Payer: Self-pay | Admitting: Gastroenterology

## 2024-10-05 VITALS — BP 135/73 | HR 73 | Temp 97.0°F | Resp 12 | Ht 61.0 in | Wt 134.0 lb

## 2024-10-05 DIAGNOSIS — R1310 Dysphagia, unspecified: Secondary | ICD-10-CM | POA: Diagnosis not present

## 2024-10-05 MED ORDER — SODIUM CHLORIDE 0.9 % IV SOLN: 500.0000 mL | INTRAVENOUS | Status: DC

## 2024-10-05 NOTE — Progress Notes (Signed)
 [facility] History and Physical   Primary Care Physician:  Jordan, [name], MD   Reason for Procedure:  Dysphagia  Plan:    EGD  with possible interventions as needed     HPI: Tracey Armstrong is a very pleasant 89 y.o. female here for EGD for dysphagia. Please refer to office visit note by Delon Failing for details  The risks and benefits as well as alternatives of endoscopic procedure(s) have been discussed and reviewed.  The patient was provided an opportunity to ask questions and all were answered. The patient agreed with the plan and demonstrated an understanding of the instructions.   Past Medical History:  Diagnosis Date   Anxiety    Arthritis    Chronic insomnia 03/30/2015   Chronic low back pain 12/02/2016   Depression    GERD (gastroesophageal reflux disease)    Hiatal hernia    High cholesterol    Hypertelorism    Hypertension    Meningioma (HCC)    Migraine    Seizures (HCC)     Past Surgical History:  Procedure Laterality Date   FEMUR IM NAIL Left 06/02/2020   Procedure: INTRAMEDULLARY (IM) RETROGRADE FEMORAL NAILING - LEFT;  Surgeon: Fidel Rogue, MD;  Location: [facility] OR;  Service: Orthopedics;  Laterality: Left;   STOMACH SURGERY      Prior to Admission medications  Medication Sig Start Date End Date Taking? Authorizing Provider  ALPRAZolam  (XANAX ) 1 MG tablet TAKE 1/2 TO 1 TABLET(0.5 TO 1 MG) BY MOUTH TWICE DAILY AS NEEDED FOR ANXIETY 04/26/24  Yes Jordan, [name], MD  antiseptic oral rinse (BIOTENE) LIQD Use 3 mls 3-4 times per day for dry mouth. Do not swallow. 12/26/20  Yes Jordan, [name], MD  Cholecalciferol  (VITAMIN D3) 1.25 MG (50000 UT) CAPS TAKE 1 CAPSULE BY MOUTH EVERY 14 DAYS 11/18/23  Yes Jordan, [name], MD  diphenhydramine-acetaminophen  (TYLENOL  PM) 25-500 MG TABS tablet Take 1.5 tablets by mouth at bedtime.   Yes [provider]  estradiol (ESTRACE) 0.01 % CREA vaginal cream Place 1 Applicatorful vaginally 3 (three) times  a week. 08/18/24  Yes [provider]  gabapentin  (NEURONTIN ) 300 MG capsule Take 1 capsule twice a day 04/04/24  Yes Georjean Darice HERO, MD  HYDROcodone -acetaminophen  (NORCO/VICODIN) 5-325 MG tablet Take 0.5-1 tablets by mouth 2 (two) times daily as needed. 10/03/24  Yes Debby Fidela CROME, NP  levETIRAcetam  (KEPPRA ) 500 MG tablet Take 1 tablet twice a day 04/04/24  Yes Georjean Darice HERO, MD  Magnesium  400 MG TABS Take 400 mg by mouth at bedtime.   Yes [provider]  Melatonin 10 MG TABS Take 10 mg by mouth at bedtime as needed (for sleep).   Yes [provider]  omeprazole  (PRILOSEC) 40 MG capsule Take 1 capsule (40 mg total) by mouth daily. 09/06/24  Yes Failing Delon Gibson, PA  oxybutynin  (DITROPAN -XL) 10 MG 24 hr tablet Take 1 tablet (10 mg total) by mouth at bedtime. 08/04/24  Yes Jordan, [name], MD  pravastatin  (PRAVACHOL ) 20 MG tablet TAKE 1 TABLET(20 MG) BY MOUTH 3 TIMES A WEEK 09/20/24  Yes Jordan, [name], MD  pyridOXINE  (VITAMIN B-6) 100 MG tablet Take 400 mg by mouth daily with lunch.   Yes [provider]  riboflavin  (VITAMIN B-2) 100 MG TABS tablet Take 400 mg by mouth daily with lunch.   Yes [provider]  verapamil  (VERELAN ) 180 MG 24 hr capsule TAKE 1 CAPSULE(180 MG) BY MOUTH DAILY 10/26/23  Yes Jordan, [name], MD  ammonium lactate  (LAC-HYDRIN ) 12 % lotion Apply 1 Application topically daily as needed for dry skin (on heels). 02/11/23   Jordan, [name], MD  Dextran 70-Hypromellose (ARTIFICIAL TEARS PF OP) Place 1-2 drops into both eyes See admin instructions. Instill 1-2 drops into both eyes at bedtime and an additional 2-3 times a day as needed for dryness    [provider]  diclofenac  (FLECTOR ) 1.3 % PTCH Place 1 patch onto the skin 2 (two) times daily. 02/26/22   Jordan, [name], MD  POTASSIUM PO Take 1 tablet by mouth daily with lunch.    [provider]  tretinoin  (RETIN-A ) 0.1 % cream APPLY TO AFFECTED AREA EVERY DAY  AT BEDTIME 09/22/18   Jordan, [name], MD    Current Outpatient Medications  Medication Sig Dispense Refill   ALPRAZolam  (XANAX ) 1 MG tablet TAKE 1/2 TO 1 TABLET(0.5 TO 1 MG) BY MOUTH TWICE DAILY AS NEEDED FOR ANXIETY 45 tablet 3   antiseptic oral rinse (BIOTENE) LIQD Use 3 mls 3-4 times per day for dry mouth. Do not swallow. 237 mL 0   Cholecalciferol  (VITAMIN D3) 1.25 MG (50000 UT) CAPS TAKE 1 CAPSULE BY MOUTH EVERY 14 DAYS 7 capsule 3   diphenhydramine-acetaminophen  (TYLENOL  PM) 25-500 MG TABS tablet Take 1.5 tablets by mouth at bedtime.     estradiol (ESTRACE) 0.01 % CREA vaginal cream Place 1 Applicatorful vaginally 3 (three) times a week.     gabapentin  (NEURONTIN ) 300 MG capsule Take 1 capsule twice a day 60 capsule 11   HYDROcodone -acetaminophen  (NORCO/VICODIN) 5-325 MG tablet Take 0.5-1 tablets by mouth 2 (two) times daily as needed. 45 tablet 0   levETIRAcetam  (KEPPRA ) 500 MG tablet Take 1 tablet twice a day 60 tablet 11   Magnesium  400 MG TABS Take 400 mg by mouth at bedtime.     Melatonin 10 MG TABS Take 10 mg by mouth at bedtime as needed (for sleep).     omeprazole  (PRILOSEC) 40 MG capsule Take 1 capsule (40 mg total) by mouth daily. 30 capsule 3   oxybutynin  (DITROPAN -XL) 10 MG 24 hr tablet Take 1 tablet (10 mg total) by mouth at bedtime. 90 tablet 1   pravastatin  (PRAVACHOL ) 20 MG tablet TAKE 1 TABLET(20 MG) BY MOUTH 3 TIMES A WEEK 13 tablet 5   pyridOXINE  (VITAMIN B-6) 100 MG tablet Take 400 mg by mouth daily with lunch.     riboflavin  (VITAMIN B-2) 100 MG TABS tablet Take 400 mg by mouth daily with lunch.     verapamil  (VERELAN ) 180 MG 24 hr capsule TAKE 1 CAPSULE(180 MG) BY MOUTH DAILY 90 capsule 3   ammonium lactate  (LAC-HYDRIN ) 12 % lotion Apply 1 Application topically daily as needed for dry skin (on heels). 222 g 0   Dextran 70-Hypromellose (ARTIFICIAL TEARS PF OP) Place 1-2 drops into both eyes See admin instructions. Instill 1-2 drops into both eyes at bedtime and an  additional 2-3 times a day as needed for dryness     diclofenac  (FLECTOR ) 1.3 % PTCH Place 1 patch onto the skin 2 (two) times daily. 60 patch 1   POTASSIUM PO Take 1 tablet by mouth daily with lunch.     tretinoin  (RETIN-A ) 0.1 % cream APPLY TO AFFECTED AREA EVERY DAY AT BEDTIME 45 g 2   Current Facility-Administered Medications  Medication Dose Route Frequency Provider Last Rate Last Admin   0.9 %  sodium chloride  infusion  500 mL Intravenous Continuous Graceson Nichelson,  Gustav GAILS, MD        Allergies as of 10/05/2024 - Review Complete 10/05/2024  Allergen Reaction Noted   Dihydrotachysterol Other (See Comments) 03/29/2015   Trazodone and nefazodone Other (See Comments) 03/29/2015   Valium [diazepam] Other (See Comments) 03/26/2016   Citalopram Other (See Comments) 03/16/2017   Other Other (See Comments) 06/08/2017    Family History  Problem Relation Age of Onset   Heart Problems Mother    Gallbladder disease Mother    Migraines Father    Stroke Father    Epilepsy Brother    Cancer Brother 92       unknown type - metastatic   Lung cancer Brother    Heart attack Maternal Grandfather    Heart attack Paternal Grandfather     Social History   Socioeconomic History   Marital status: Widowed    Spouse name: Not on file   Number of children: 4   Years of education: 28   Highest education level: Not on file  Occupational History   Occupation: Retired  Tobacco Use   Smoking status: Never   Smokeless tobacco: Never  Vaping Use   Vaping status: Never Used  Substance and Sexual Activity   Alcohol  use: No   Drug use: No   Sexual activity: Not Currently  Other Topics Concern   Not on file  Social History Narrative   Lives in [place] [facility]      Patient drinks 1 glass of tea daily.      Patient is right handed.   Social Drivers of Health   Tobacco Use: Low Risk (10/05/2024)   Patient History    Smoking Tobacco Use: Never    Smokeless Tobacco Use: Never    Passive  Exposure: Not on file  Financial Resource Strain: Low Risk (11/30/2023)   Overall Financial Resource Strain (CARDIA)    Difficulty of Paying Living Expenses: Not hard at all  Food Insecurity: No Food Insecurity (11/30/2023)   Hunger Vital Sign    Worried About Running Out of Food in the Last Year: Never true    Ran Out of Food in the Last Year: Never true  Transportation Needs: No Transportation Needs (11/30/2023)   PRAPARE - Administrator, Civil Service (Medical): No    Lack of Transportation (Non-Medical): No  Physical Activity: Insufficiently Active (11/30/2023)   Exercise Vital Sign    Days of Exercise per Week: 2 days    Minutes of Exercise per Session: 60 min  Stress: No Stress Concern Present (11/30/2023)   [employer] of Occupational Health - Occupational Stress Questionnaire    Feeling of Stress : Not at all  Social Connections: Moderately Integrated (11/30/2023)   Social Connection and Isolation Panel    Frequency of Communication with Friends and Family: More than three times a week    Frequency of Social Gatherings with Friends and Family: More than three times a week    Attends Religious Services: More than 4 times per year    Active Member of [employer] or Organizations: Yes    Attends Banker Meetings: More than 4 times per year    Marital Status: Widowed  Intimate Partner Violence: Not At Risk (11/30/2023)   Humiliation, Afraid, Rape, and Kick questionnaire    Fear of Current or Ex-Partner: No    Emotionally Abused: No    Physically Abused: No    Sexually Abused: No  Depression (PHQ2-9): Low Risk (10/03/2024)   Depression (  PHQ2-9)    PHQ-2 Score: 0  Alcohol  Screen: Low Risk (11/30/2023)   Alcohol  Screen    Last Alcohol  Screening Score (AUDIT): 0  Housing: Unknown (11/30/2023)   Housing Stability Vital Sign    Unable to Pay for Housing in the Last Year: No    Number of Times Moved in the Last Year: Not on file    Homeless in the Last Year: No   Utilities: Not At Risk (11/30/2023)   AHC Utilities    Threatened with loss of utilities: No  Health Literacy: Adequate Health Literacy (11/30/2023)   B1300 Health Literacy    Frequency of need for help with medical instructions: Never    Review of Systems:  All other review of systems negative except as mentioned in the HPI.  Physical Exam: Vital signs in last 24 hours: BP (!) 143/75   Pulse 71   Temp (!) 97 F (36.1 C)   Ht 5' 1 (1.549 m)   Wt 134 lb (60.8 kg)   SpO2 97%   BMI 25.32 kg/m  General:   Alert, NAD Lungs:  Clear .   Heart:  Regular rate and rhythm Abdomen:  Soft, nontender and nondistended. Neuro/Psych:  Alert and cooperative. Normal mood and affect. A and O x 3  Reviewed labs, radiology imaging, old records and pertinent past GI work up  Patient is appropriate for planned procedure(s) and anesthesia in an ambulatory setting   K. Veena Carlton Sweaney , MD [phone]

## 2024-10-05 NOTE — Patient Instructions (Addendum)
 YOU HAD AN ENDOSCOPIC PROCEDURE TODAY AT THE [facility]:   Refer to the procedure report that was given to you for any specific questions about what was found during the examination.  If the procedure report does not answer your questions, please call your gastroenterologist to clarify.  If you requested that your care partner not be given the details of your procedure findings, then the procedure report has been included in a sealed envelope for you to review at your convenience later.  YOU SHOULD EXPECT: Some feelings of bloating in the abdomen. Passage of more gas than usual.  Walking can help get rid of the air that was put into your GI tract during the procedure and reduce the bloating. If you had a lower endoscopy (such as a colonoscopy or flexible sigmoidoscopy) you may notice spotting of blood in your stool or on the toilet paper. If you underwent a bowel prep for your procedure, you may not have a normal bowel movement for a few days.  Please Note:  You might notice some irritation and congestion in your nose or some drainage.  This is from the oxygen used during your procedure.  There is no need for concern and it should clear up in a day or so.  SYMPTOMS TO REPORT IMMEDIATELY:  Following upper endoscopy (EGD)  Vomiting of blood or coffee ground material  New chest pain or pain under the shoulder blades  Painful or persistently difficult swallowing  New shortness of breath  Fever of 100F or higher  Black, tarry-looking stools  For urgent or emergent issues, a gastroenterologist can be reached at any hour by calling [phone]. Do not use MyChart messaging for urgent concerns.    DIET:  We do recommend a small meal at first, but then you may proceed to your regular diet.  Drink plenty of fluids but you should avoid alcoholic beverages for 24 hours.  FOLLOW UP:  Follow an Anti-reflux regimen (see handout given to you by your recovery nurse).  MEDICATIONS: Continue  present medications.  Thank you for allowing us  to provide for your healthcare needs today.   ACTIVITY:  You should plan to take it easy for the rest of today and you should NOT DRIVE or use heavy machinery until tomorrow (because of the sedation medicines used during the test).    FOLLOW UP: Our staff will call the number listed on your records the next business day following your procedure.  We will call around 7:15- 8:00 am to check on you and address any questions or concerns that you may have regarding the information given to you following your procedure. If we do not reach you, we will leave a message.     If any biopsies were taken you will be contacted by phone or by letter within the next 1-3 weeks.  Please call us  at [phone] if you have not heard about the biopsies in 3 weeks.    SIGNATURES/CONFIDENTIALITY: You and/or your care partner have signed paperwork which will be entered into your electronic medical record.  These signatures attest to the fact that that the information above on your After Visit Summary has been reviewed and is understood.  Full responsibility of the confidentiality of this discharge information lies with you and/or your care-partner.

## 2024-10-05 NOTE — Progress Notes (Signed)
 Report to PACU, RN, vss, BBS= Clear.

## 2024-10-05 NOTE — Op Note (Signed)
 [facility] Patient Name: Tracey Armstrong Procedure Date: 10/05/2024 11:36 AM MRN: [id number] Endoscopist: Gustav ALONSO Mcgee , MD, [id number] Age: 89 Referring MD:  Date of Birth: 11/30/1934 Gender: Female Account #: [id number] Procedure:                Upper GI endoscopy Indications:              Dysphagia Medicines:                Monitored Anesthesia Care Procedure:                Pre-Anesthesia Assessment:                           - Prior to the procedure, a History and Physical                            was performed, and patient medications and                            allergies were reviewed. The patient's tolerance of                            previous anesthesia was also reviewed. The risks                            and benefits of the procedure and the sedation                            options and risks were discussed with the patient.                            All questions were answered, and informed consent                            was obtained. Prior Anticoagulants: The patient has                            taken no anticoagulant or antiplatelet agents. ASA                            Grade Assessment: III - A patient with severe                            systemic disease. After reviewing the risks and                            benefits, the patient was deemed in satisfactory                            condition to undergo the procedure.                           After obtaining informed consent, the endoscope was  passed under direct vision. Throughout the                            procedure, the patient's blood pressure, pulse, and                            oxygen saturations were monitored continuously. The                            Olympus Scope [id number] was introduced through the                            mouth, and advanced to the second part of duodenum.                            The upper GI endoscopy was  accomplished without                            difficulty. The patient tolerated the procedure                            well. Scope In: Scope Out: Findings:                 No gross lesions were noted in the entire esophagus.                           The Z-line was regular and was found 38 cm from the                            incisors.                           Evidence of a Nissen fundoplication was found in                            the cardia. The wrap appeared intact. This was                            traversed.                           The stomach was normal.                           The cardia and gastric fundus were normal on                            retroflexion.                           The examined duodenum was normal. Complications:            No immediate complications. Estimated Blood Loss:     Estimated blood loss: none. Impression:               - No gross lesions in  the entire esophagus.                           - Z-line regular, 38 cm from the incisors.                           - A Nissen fundoplication was found. The wrap                            appears intact.                           - Normal stomach.                           - Normal examined duodenum.                           - No specimens collected. Recommendation:           - Resume previous diet.                           - Continue present medications.                           - Follow an antireflux regimen. Shandale Malak V. Matheu Ploeger, MD 10/05/2024 11:56:56 AM This report has been signed electronically.

## 2024-10-06 ENCOUNTER — Telehealth: Payer: Self-pay | Admitting: *Deleted

## 2024-10-06 LAB — DRUG TOX MONITOR 1 W/CONF, ORAL FLD
Amphetamines: NEGATIVE ng/mL (ref <10)
Barbiturates: NEGATIVE ng/mL (ref <10)
Benzodiazepines: NEGATIVE ng/mL (ref <0.50)
Buprenorphine: NEGATIVE ng/mL (ref <0.10)
Cocaine: NEGATIVE ng/mL (ref <5.0)
Codeine: NEGATIVE ng/mL (ref <2.5)
Dihydrocodeine: NEGATIVE ng/mL (ref <2.5)
Fentanyl: NEGATIVE ng/mL (ref <0.10)
Heroin Metabolite: NEGATIVE ng/mL (ref <1.0)
Hydrocodone: 5.1 ng/mL — ABNORMAL HIGH (ref <2.5)
Hydromorphone: NEGATIVE ng/mL (ref <2.5)
MARIJUANA: NEGATIVE ng/mL (ref <2.5)
MDMA: NEGATIVE ng/mL (ref <10)
Meprobamate: NEGATIVE ng/mL (ref <2.5)
Methadone: NEGATIVE ng/mL (ref <5.0)
Morphine: NEGATIVE ng/mL (ref <2.5)
Nicotine Metabolite: NEGATIVE ng/mL (ref <5.0)
Norhydrocodone: NEGATIVE ng/mL (ref <2.5)
Noroxycodone: NEGATIVE ng/mL (ref <2.5)
Opiates: POSITIVE ng/mL — AB (ref <2.5)
Oxycodone: NEGATIVE ng/mL (ref <2.5)
Oxymorphone: NEGATIVE ng/mL (ref <2.5)
Phencyclidine: NEGATIVE ng/mL (ref <10)
Tapentadol: NEGATIVE ng/mL (ref <5.0)
Tramadol: NEGATIVE ng/mL (ref <5.0)
Zolpidem: NEGATIVE ng/mL (ref <5.0)

## 2024-10-06 LAB — DRUG TOX ALC METAB W/CON, ORAL FLD: Alcohol Metabolite: NEGATIVE ng/mL (ref <25)

## 2024-10-06 NOTE — Telephone Encounter (Signed)
°  Follow up Call-     10/05/2024   10:21 AM  Call back number  Post procedure Call Back phone  # [phone]     Patient questions:  Do you have a fever, pain , or abdominal swelling? No. Pain Score  0 *  Have you tolerated food without any problems? Yes.    Have you been able to return to your normal activities? Yes.    Do you have any questions about your discharge instructions: Diet   No. Medications  No. Follow up visit  No.  Do you have questions or concerns about your Care? No.  Actions: * If pain score is 4 or above: No action needed, pain <4.

## 2024-10-18 ENCOUNTER — Other Ambulatory Visit: Payer: Self-pay | Admitting: Family Medicine

## 2024-10-18 DIAGNOSIS — F411 Generalized anxiety disorder: Secondary | ICD-10-CM

## 2024-11-02 ENCOUNTER — Telehealth: Payer: Self-pay | Admitting: Physical Medicine and Rehabilitation

## 2024-11-02 NOTE — Telephone Encounter (Signed)
 Pt called asking for an appt for with [facility] ([facility] Last injection 08/08/24. Pt number is [phone].

## 2024-11-03 ENCOUNTER — Telehealth: Payer: Self-pay | Admitting: Physical Medicine and Rehabilitation

## 2024-11-03 ENCOUNTER — Other Ambulatory Visit: Payer: Self-pay | Admitting: Physical Medicine and Rehabilitation

## 2024-11-03 ENCOUNTER — Encounter: Admitting: Registered Nurse

## 2024-11-03 DIAGNOSIS — M25552 Pain in left hip: Secondary | ICD-10-CM

## 2024-11-03 DIAGNOSIS — M48062 Spinal stenosis, lumbar region with neurogenic claudication: Secondary | ICD-10-CM

## 2024-11-03 DIAGNOSIS — M5416 Radiculopathy, lumbar region: Secondary | ICD-10-CM

## 2024-11-03 NOTE — Telephone Encounter (Signed)
 Pt called saying that she didn't understand the VM that was left. Call back number is [phone].

## 2024-11-03 NOTE — Telephone Encounter (Signed)
 I spoke with patient this afternoon. She is very poor historian. She has a history of chronic left hip pain. We have performed lumbar epidural steroid injections for many years. It is difficult to discern if these injections have actually been beneficial in alleviating her pain. Her most recent injection was left L4 transforaminal epidural steroid injection on 08/08/2024. She initially told me this injection was not helpful and that epidural steroid injections don't work for her. Toward the end of our conversation she stated the injection in October helped a lot and she wanted to repeat this procedure. I placed order for repeat injection. She also has upcoming appointment with my colleague Bertrum Gaskins, PA. She plans to discuss her chronic pain with him.

## 2024-11-03 NOTE — Telephone Encounter (Signed)
 Pt called asking when can she make her appt with [facility]. Pt called yesterday to states she would like to see [facility] for another injection. Pt called again yetserday before we closed tro ask to make appt with [facility]. I explained yesterday that we have to wait for approval for injections. Pt called today and  explained to pt that we are wanting for approval for appt. Pt keep asking when will insurance approve for her injection, and stated her insurance alway pay. I explained to pt again that [facility] staff sets his appt and will call her when its approved. Pt starting yelling that to make the appt or she is going to another provider. I explained to pt again the process of it and she continued to yell and I disconnected the call and advise I would send message.

## 2024-11-07 ENCOUNTER — Telehealth: Payer: Self-pay

## 2024-11-07 ENCOUNTER — Other Ambulatory Visit: Payer: Self-pay | Admitting: Physical Medicine and Rehabilitation

## 2024-11-07 NOTE — Telephone Encounter (Signed)
 Pre Procedural Valium 

## 2024-11-08 ENCOUNTER — Other Ambulatory Visit: Payer: Self-pay | Admitting: Family Medicine

## 2024-11-08 DIAGNOSIS — F411 Generalized anxiety disorder: Secondary | ICD-10-CM

## 2024-11-08 DIAGNOSIS — I10 Essential (primary) hypertension: Secondary | ICD-10-CM

## 2024-11-14 ENCOUNTER — Ambulatory Visit: Admitting: Physician Assistant

## 2024-11-16 ENCOUNTER — Ambulatory Visit: Admitting: Physician Assistant

## 2024-11-16 ENCOUNTER — Other Ambulatory Visit: Payer: Self-pay | Admitting: Physical Medicine and Rehabilitation

## 2024-11-16 DIAGNOSIS — M1712 Unilateral primary osteoarthritis, left knee: Secondary | ICD-10-CM

## 2024-11-16 DIAGNOSIS — M7062 Trochanteric bursitis, left hip: Secondary | ICD-10-CM | POA: Diagnosis not present

## 2024-11-16 MED ORDER — METHYLPREDNISOLONE ACETATE 40 MG/ML IJ SUSP
40.0000 mg | INTRAMUSCULAR | Status: AC | PRN
  Administered 2024-11-16: 40 mg via INTRA_ARTICULAR

## 2024-11-16 MED ORDER — LIDOCAINE HCL 1 % IJ SOLN
3.0000 mL | INTRAMUSCULAR | Status: AC | PRN
  Administered 2024-11-16: 3 mL

## 2024-11-16 NOTE — Progress Notes (Signed)
" ° °  Procedure Note  Patient: Tracey Armstrong             Date of Birth: Mar 04, 1935           MRN: [id number]             Visit Date: 11/16/2024  HPI Tracey Armstrong comes in today requesting cortisone injection left knee.  Again she has known osteoarthritis left knee.  She is also asking about an injection for her left hip bursitis.  She was given an injection in both these on 08/10/2024.  These gave her good relief until recently.  No new injuries.  No fevers chills.  Physical exam: General well-developed well-nourished female ambulates with a rollator. Left hip good range of motion without pain.  Tenderness over the trochanteric region. Left knee: No abnormal warmth or edema or effusion.  Patellofemoral crepitus with passive motion.  Procedures: Visit Diagnoses:  1. Primary osteoarthritis of left knee   2. Trochanteric bursitis, left hip     Large Joint Inj: L knee on 11/16/2024 12:39 PM Indications: pain Details: 22 G 1.5 in needle, anterolateral approach  Arthrogram: No  Medications: 3 mL lidocaine  1 %; 40 mg methylPREDNISolone  acetate 40 MG/ML Outcome: tolerated well, no immediate complications Procedure, treatment alternatives, risks and benefits explained, specific risks discussed. Consent was given by the patient. Immediately prior to procedure a time out was called to verify the correct patient, procedure, equipment, support staff and site/side marked as required. Patient was prepped and draped in the usual sterile fashion.     Plan: She will follow-up with us  as needed.  She did ask about therapy at the skilled facility she states that.  Therefore prescription was given for IT band stretching, quad strengthening, and gait balance training.   "

## 2024-11-23 ENCOUNTER — Other Ambulatory Visit: Payer: Self-pay | Admitting: Family Medicine

## 2024-11-23 DIAGNOSIS — F411 Generalized anxiety disorder: Secondary | ICD-10-CM

## 2024-11-28 ENCOUNTER — Encounter: Admitting: Physical Medicine and Rehabilitation

## 2024-11-30 ENCOUNTER — Encounter: Admitting: Registered Nurse

## 2024-12-05 ENCOUNTER — Ambulatory Visit: Payer: Medicare Other

## 2024-12-12 ENCOUNTER — Ambulatory Visit: Admitting: Family Medicine

## 2025-04-05 ENCOUNTER — Ambulatory Visit: Admitting: Neurology

## 2025-04-11 ENCOUNTER — Ambulatory Visit: Admitting: Neurology
